# Patient Record
Sex: Female | Born: 1935 | Race: White | Hispanic: No | Marital: Married | Smoking: Never smoker
Health system: Southern US, Community
[De-identification: ages and names within clinical notes are randomized; demographics above are authoritative.]

## PROBLEM LIST (undated history)

## (undated) DIAGNOSIS — R569 Unspecified convulsions: Secondary | ICD-10-CM

## (undated) DIAGNOSIS — R413 Other amnesia: Secondary | ICD-10-CM

## (undated) DIAGNOSIS — R51 Headache: Secondary | ICD-10-CM

## (undated) DIAGNOSIS — I779 Disorder of arteries and arterioles, unspecified: Secondary | ICD-10-CM

## (undated) DIAGNOSIS — J90 Pleural effusion, not elsewhere classified: Secondary | ICD-10-CM

## (undated) DIAGNOSIS — F419 Anxiety disorder, unspecified: Secondary | ICD-10-CM

## (undated) DIAGNOSIS — J449 Chronic obstructive pulmonary disease, unspecified: Secondary | ICD-10-CM

## (undated) DIAGNOSIS — N390 Urinary tract infection, site not specified: Secondary | ICD-10-CM

## (undated) DIAGNOSIS — I10 Essential (primary) hypertension: Secondary | ICD-10-CM

## (undated) DIAGNOSIS — T148XXA Other injury of unspecified body region, initial encounter: Secondary | ICD-10-CM

## (undated) DIAGNOSIS — M1990 Unspecified osteoarthritis, unspecified site: Secondary | ICD-10-CM

## (undated) DIAGNOSIS — I739 Peripheral vascular disease, unspecified: Secondary | ICD-10-CM

## (undated) DIAGNOSIS — H905 Unspecified sensorineural hearing loss: Secondary | ICD-10-CM

## (undated) DIAGNOSIS — K219 Gastro-esophageal reflux disease without esophagitis: Secondary | ICD-10-CM

## (undated) DIAGNOSIS — I469 Cardiac arrest, cause unspecified: Secondary | ICD-10-CM

## (undated) DIAGNOSIS — I2510 Atherosclerotic heart disease of native coronary artery without angina pectoris: Secondary | ICD-10-CM

## (undated) DIAGNOSIS — I350 Nonrheumatic aortic (valve) stenosis: Secondary | ICD-10-CM

## (undated) HISTORY — DX: Urinary tract infection, site not specified: N39.0

## (undated) HISTORY — PX: JOINT REPLACEMENT: SHX530

## (undated) HISTORY — DX: Peripheral vascular disease, unspecified: I73.9

## (undated) HISTORY — DX: Pleural effusion, not elsewhere classified: J90

## (undated) HISTORY — DX: Nonrheumatic aortic (valve) stenosis: I35.0

## (undated) HISTORY — PX: APPENDECTOMY: SHX54

## (undated) HISTORY — PX: CARDIAC CATHETERIZATION: SHX172

## (undated) HISTORY — DX: Disorder of arteries and arterioles, unspecified: I77.9

## (undated) HISTORY — DX: Cardiac arrest, cause unspecified: I46.9

## (undated) HISTORY — PX: ABDOMINAL HYSTERECTOMY: SHX81

---

## 2000-09-19 ENCOUNTER — Ambulatory Visit (HOSPITAL_COMMUNITY): Admission: RE | Admit: 2000-09-19 | Discharge: 2000-09-19 | Payer: Self-pay | Admitting: Family Medicine

## 2000-09-19 ENCOUNTER — Encounter: Payer: Self-pay | Admitting: Family Medicine

## 2000-10-11 ENCOUNTER — Other Ambulatory Visit: Admission: RE | Admit: 2000-10-11 | Discharge: 2000-10-11 | Payer: Self-pay | Admitting: Obstetrics and Gynecology

## 2002-05-16 ENCOUNTER — Ambulatory Visit (HOSPITAL_COMMUNITY): Admission: RE | Admit: 2002-05-16 | Discharge: 2002-05-16 | Payer: Self-pay | Admitting: Family Medicine

## 2002-05-16 ENCOUNTER — Encounter: Payer: Self-pay | Admitting: Family Medicine

## 2002-06-17 ENCOUNTER — Ambulatory Visit (HOSPITAL_COMMUNITY): Admission: RE | Admit: 2002-06-17 | Discharge: 2002-06-17 | Payer: Self-pay | Admitting: Family Medicine

## 2002-06-17 ENCOUNTER — Encounter: Payer: Self-pay | Admitting: Family Medicine

## 2003-03-22 ENCOUNTER — Emergency Department (HOSPITAL_COMMUNITY): Admission: EM | Admit: 2003-03-22 | Discharge: 2003-03-23 | Payer: Self-pay | Admitting: Emergency Medicine

## 2004-04-27 ENCOUNTER — Ambulatory Visit (HOSPITAL_COMMUNITY): Admission: RE | Admit: 2004-04-27 | Discharge: 2004-04-27 | Payer: Self-pay | Admitting: Family Medicine

## 2004-07-12 ENCOUNTER — Ambulatory Visit (HOSPITAL_COMMUNITY): Admission: RE | Admit: 2004-07-12 | Discharge: 2004-07-12 | Payer: Self-pay | Admitting: Family Medicine

## 2004-09-13 ENCOUNTER — Inpatient Hospital Stay (HOSPITAL_COMMUNITY): Admission: RE | Admit: 2004-09-13 | Discharge: 2004-09-17 | Payer: Self-pay | Admitting: Orthopedic Surgery

## 2004-10-13 ENCOUNTER — Encounter (HOSPITAL_COMMUNITY): Admission: RE | Admit: 2004-10-13 | Discharge: 2004-11-12 | Payer: Self-pay | Admitting: Orthopedic Surgery

## 2006-05-01 ENCOUNTER — Ambulatory Visit (HOSPITAL_COMMUNITY): Admission: RE | Admit: 2006-05-01 | Discharge: 2006-05-01 | Payer: Self-pay | Admitting: Family Medicine

## 2008-09-18 ENCOUNTER — Ambulatory Visit (HOSPITAL_COMMUNITY): Admission: RE | Admit: 2008-09-18 | Discharge: 2008-09-18 | Payer: Self-pay | Admitting: Orthopedic Surgery

## 2008-09-22 ENCOUNTER — Inpatient Hospital Stay (HOSPITAL_COMMUNITY): Admission: RE | Admit: 2008-09-22 | Discharge: 2008-09-25 | Payer: Self-pay | Admitting: Orthopedic Surgery

## 2009-02-16 ENCOUNTER — Ambulatory Visit (HOSPITAL_COMMUNITY): Admission: RE | Admit: 2009-02-16 | Discharge: 2009-02-16 | Payer: Self-pay | Admitting: Family Medicine

## 2009-03-02 ENCOUNTER — Encounter: Admission: RE | Admit: 2009-03-02 | Discharge: 2009-03-02 | Payer: Self-pay | Admitting: Family Medicine

## 2010-01-24 ENCOUNTER — Encounter: Payer: Self-pay | Admitting: Family Medicine

## 2010-04-09 LAB — BASIC METABOLIC PANEL
BUN: 6 mg/dL (ref 6–23)
CO2: 29 mEq/L (ref 19–32)
CO2: 30 mEq/L (ref 19–32)
Calcium: 8.7 mg/dL (ref 8.4–10.5)
Calcium: 9.2 mg/dL (ref 8.4–10.5)
Creatinine, Ser: 0.65 mg/dL (ref 0.4–1.2)
Creatinine, Ser: 0.73 mg/dL (ref 0.4–1.2)
GFR calc Af Amer: >60 mL/min (ref >60)
GFR calc non Af Amer: >60 mL/min (ref >60)
Glucose, Bld: 125 mg/dL — ABNORMAL HIGH (ref 70–99)
Glucose, Bld: 134 mg/dL — ABNORMAL HIGH (ref 70–99)
Glucose, Bld: 176 mg/dL — ABNORMAL HIGH (ref 70–99)
Potassium: 4.1 mEq/L (ref 3.5–5.1)
Sodium: 131 mEq/L — ABNORMAL LOW (ref 135–145)
Sodium: 132 mEq/L — ABNORMAL LOW (ref 135–145)
Sodium: 134 mEq/L — ABNORMAL LOW (ref 135–145)

## 2010-04-09 LAB — PROTIME-INR
INR: 1 (ref 0.00–1.49)
INR: 1.5 (ref 0.00–1.49)
Prothrombin Time: 13.5 seconds (ref 11.6–15.2)
Prothrombin Time: 15.7 seconds — ABNORMAL HIGH (ref 11.6–15.2)
Prothrombin Time: 17.8 seconds — ABNORMAL HIGH (ref 11.6–15.2)
Prothrombin Time: 11.8 seconds (ref 11.6–15.2)

## 2010-04-09 LAB — COMPREHENSIVE METABOLIC PANEL
ALT: 12 U/L (ref 0–35)
AST: 20 U/L (ref 0–37)
Alkaline Phosphatase: 87 U/L (ref 39–117)
BUN: 11 mg/dL (ref 6–23)
Calcium: 9.9 mg/dL (ref 8.4–10.5)
Glucose, Bld: 91 mg/dL (ref 70–99)
Potassium: 3.4 mEq/L — ABNORMAL LOW (ref 3.5–5.1)
Sodium: 132 mEq/L — ABNORMAL LOW (ref 135–145)
Total Bilirubin: 0.6 mg/dL (ref 0.3–1.2)
Total Protein: 8 g/dL (ref 6.0–8.3)

## 2010-04-09 LAB — CBC
HCT: 26.9 % — ABNORMAL LOW (ref 36.0–46.0)
Hemoglobin: 8.6 g/dL — ABNORMAL LOW (ref 12.0–15.0)
Hemoglobin: 9.2 g/dL — ABNORMAL LOW (ref 12.0–15.0)
MCHC: 33.7 g/dL (ref 30.0–36.0)
MCHC: 33.9 g/dL (ref 30.0–36.0)
MCHC: 33.6 g/dL (ref 30.0–36.0)
MCV: 91 fL (ref 78.0–100.0)
MCV: 91.4 fL (ref 78.0–100.0)
MCV: 92.4 fL (ref 78.0–100.0)
Platelets: 269 10*3/uL (ref 150–400)
Platelets: 277 10*3/uL (ref 150–400)
RBC: 2.76 MIL/uL — ABNORMAL LOW (ref 3.87–5.11)
RBC: 2.85 MIL/uL — ABNORMAL LOW (ref 3.87–5.11)
RDW: 13.6 % (ref 11.5–15.5)
WBC: 6.9 10*3/uL (ref 4.0–10.5)
WBC: 8.6 10*3/uL (ref 4.0–10.5)
WBC: 9.9 10*3/uL (ref 4.0–10.5)
WBC: 8.5 10*3/uL (ref 4.0–10.5)

## 2010-04-09 LAB — TYPE AND SCREEN: ABO/RH(D): O POS

## 2010-04-09 LAB — URINALYSIS, ROUTINE W REFLEX MICROSCOPIC
Bilirubin Urine: NEGATIVE
Hgb urine dipstick: NEGATIVE
Ketones, ur: NEGATIVE mg/dL
Protein, ur: NEGATIVE mg/dL
pH: 6 (ref 5.0–8.0)

## 2010-05-21 NOTE — Discharge Summary (Signed)
Beverly [name], Beverly [name]                 ACCOUNT NO.:  [id number]   MEDICAL RECORD NO.:  [id number]          PATIENT TYPE:  INP   LOCATION:  1514                         FACILITY:  [facility]   PHYSICIAN:  Ollen Gross, M.D.    DATE OF BIRTH:  04-30-1932   DATE OF ADMISSION:  09/13/2004  DATE OF DISCHARGE:  09/17/2004                                 DISCHARGE SUMMARY   ADMISSION DIAGNOSES:  1.  Osteoarthritis, right knee.  2.  Anxiety.  3.  Mild chronic obstructive pulmonary disease.  4.  History of uterine cancer.  5.  Lumbar degenerative disk disease.  6.  Postmenopausal.   DISCHARGE DIAGNOSES:  1.  Osteoarthritis, right knee, status post right total knee replacement      arthroplasty, computer navigation assisted.  2.  Mild postoperative blood loss anemia.  3.  Hyponatremia postoperative.  4.  Postoperative hyponatremia, improved.  5.  Postoperative hypokalemia, improved.  6.  Anxiety.  7.  Mild chronic obstructive pulmonary disease.  8.  History of uterine cancer.  9.  Lumbar degenerative disk disease.  10. Postmenopausal.   PROCEDURE:  On September 13, 2004, right total knee arthroplasty, computer  navigation assisted.   SURGEON:  Ollen Gross, M.D.   ASSISTANT:  Alexzandrew L. Perkins, P.A.-C.   ANESTHESIA:  Spinal.   DRAINS:  Hemovac x1.   TOURNIQUET TIME:  Fifty-nine minutes at 300 mmHg.   CONSULTS:  None.   BRIEF HISTORY:  Ms. Antonacci is a 77 year old female who developed severe end-  stage arthritis of the right knee with complex valgus deformity.  Previous  tib/fib fracture with healed in valgus with a malunion at the fracture site.  She has subsequently gone on to develop significant arthritic changes and  now presents for total knee.   LABORATORY DATA:  CBC preop:  Hemoglobin 12.9, hematocrit 37.4.  Normal  white count.  Normal differential.  Postop hemoglobin 10.7, drifted down to  10.1.  Last noted H&H 9.6 and 27.4.  PT/PTT preop 12.3 and 32,  respectively.  Serial pro times followed.  The last noted PT/INR 23 and 2.  Chem panel on  admission all within normal limits.  Serial BMETs are followed.  Sodium  dropped from 136 down to 129, back up to 137.  Potassium dropped to 3.7 to  2.7, back up to 3.6.  Urinalysis cloudy, otherwise negative.  Blood group  type O+.   EKG dated September 09, 2004, normal sinus rhythm.  Normal EKG.   A two view chest dated September 09, 2004, COPD.  No acute chest disease.   HOSPITAL COURSE:  Admitted to [facility].  Tolerated the procedure  well.  Started on PCA and p.o. analgesics.  Had a difficult night with pain.  Doing a little bit better.  The following morning, she had a drop in her  potassium down to 2.7.  She was given potassium supplements, which she  responded to.  The potassium came back up the following day on postop day #2  to 3.3  Drain that was placed at the time of surgery came  out.  Reduced the  fluid rates.  Had a drop in her sodium also after surgery down to 131.  Started to get up with physical therapy by day #2.  Doing a little bit  better, although she did have increased pain.  Potassium had improved.  Sodium was only slowly improving.  She had a little bit of increase in the  opposite leg in the left knee, felt to be an arthritic flare.  Treated with  ice and pain meds.  Started getting up with more therapy.  By day 3, she  progressed well and was up ambulating approximately 200 feet.  The right  knee was doing better from a pain standpoint but still had pain in the left.  Due to the continued pain, the patient underwent an aspiration injection of  lidocaine and Depo-Medrol without difficulty at the bedside.  Electrolytes  continued to improve.   By September 17, 2004, the following day, the patient was doing much better.  Had been seen in rounds with Dr. Despina Hick.  Tolerated the previous injection  well of the left knee, which had relieved some of the symptoms.  From  the  right knee standpoint postoperatively, she is doing quite well, tolerating  her medicines, and was discharged home.   DISCHARGE PLAN:  1.  Patient was discharged home on September 15 , 2006.  2.  Discharge diagnoses:  Please see above.  3.  Discharge meds:  Coumadin, Percocet, Robaxin.  4.  Diet:  As tolerated.  5.  Follow up two weeks from surgery.  Call the office for an appointment at      [phone].  6.  Activity:  Weightbearing as tolerated.  Home health PT/OT and home      health nursing.  Total knee protocol.  May start showering.   DISPOSITION:  Home.   CONDITION ON DISCHARGE:  Improved.      Alexzandrew L. Julien Girt, P.A.      Ollen Gross, M.D.  Electronically Signed    ALP/MEDQ  D:  10/28/2004  T:  10/28/2004  Job:  [id number]   cc:   Angus G. Renard Matter, MD  Fax: [phone]

## 2010-05-21 NOTE — H&P (Signed)
NAME:  Beverly [name], Beverly [name]                 ACCOUNT NO.:  [id number]   MEDICAL RECORD NO.:  [id number]          PATIENT TYPE:  INP   LOCATION:  NA                           FACILITY:  [facility]   PHYSICIAN:  Ollen Gross, M.D.    DATE OF BIRTH:  Dec 23, 1932   DATE OF ADMISSION:  09/13/2004  DATE OF DISCHARGE:                                HISTORY & PHYSICAL   DATE OF OFFICE VISIT HISTORY AND PHYSICAL:  September 02, 2004.   DATE OF ADMISSION:  September 13, 2004.   CHIEF COMPLAINT:  Right knee pain.   HISTORY OF PRESENT ILLNESS:  The patient is a 77 year old female, well known  to Dr. Homero Fellers Aluisio.  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  Long-standing history of progressive right knee pain.  She is known to have  significant arthritis.  Problems date back about 20-25 years when she had a  right tibia fracture.  She underwent non-operative management __________.  She was seen in the office, where she was evaluated by Dr. Lequita Halt, and  shown to have valgus deformity about 15 to 20 degrees with severe end-stage  arthritis, worse lateral and patellofemoral, but involving all 3 components.  It is felt she would be a good candidate for knee replacement.  Risks and  benefits discussed.  The patient is subsequently admitted to the hospital.   ALLERGIES:  1.  PENICILLIN.  2.  CODEINE.   CURRENT MEDICATIONS:  1.  Toprol-XL 25 mg daily.  2.  Lozol 2.5 mg daily.  3.  Celebrex 200 mg daily.  4.  Vivelle-Dot estrogen 0.0375 patches.  5.  Xanax 0.5 p.r.n.  6.  Lortab 7.5 p.r.n.  7.  Lunesta 3 mg for sleep.  8.  Calcium plus vitamin D twice a day.  9.  Multivitamin daily.  10. Prevacid 15 mg daily.   PAST MEDICAL HISTORY:  1.  Anxiety.  2.  COPD.  3.  Uterine cancer.  4.  Postmenopausal.  5.  Degenerative disk disease.  6.  Right tibia fracture.   PAST SURGICAL HISTORY:  1.  Tonsillectomy.  2.  Tubal ligation.  3.  Hysterectomy.   SOCIAL HISTORY:  Married.  Semi-retired  Designer, jewellery.  Nonsmoker.  Has 2  children.  Husband and daughter will be assisting with care after surgery.   FAMILY HISTORY:  Mother living, age 75, with possible history of stroke.  Father with a history of lung cancer and arthritis.  Has a daughter with  arthritis.   REVIEW OF SYSTEMS:  GENERAL:  No fevers, chills, night sweats.  NEUROLOGIC:  No seizures, syncope, paralysis.  RESPIRATORY:  No shortness of breath,  productive cough, or hemoptysis.  CARDIOVASCULAR:  No chest pain, angina, or  orthopnea.  GI:  No nausea, vomiting, diarrhea, or constipation.  GU:  No  dysuria, hematuria, or discharge.  MUSCULOSKELETAL:  Right knee as found in  the history of present illness.   PHYSICAL EXAMINATION:  VITAL SIGNS:  Pulse 68, respirations 20, blood  pressure 152/78.  GENERAL:  A 77 year old white female, well-nourished, well-developed, in no  acute distress.  Alert, oriented, and cooperative.  Very pleasant.  Excellent historian.  HEENT:  Normocephalic and atraumatic.  Pupils round and reactive.  Oropharynx clear.  Extraocular movements intact.  NECK:  Supple.  No bruits.  CHEST:  Clear anterior and posterior chest walls.  No rhonchi, rales, or  wheezing.  HEART:  Regular rate and rhythm.  No murmur.  S1 and S2 noted.  ABDOMEN:  Soft, nontender.  Bowel sounds present.  RECTAL/BREAST/GENITALIA:  Not done, not pertinent to present illness.  EXTREMITIES:  Right knee shows a slight valgus malalignment with the right  lower extremity.  Marked crepitus noted on passive range of motion.  Range  of motion shows 5 to about 115 degrees.   IMPRESSION:  1.  Osteoarthritis, right knee.  2.  Anxiety.  3.  Mild chronic obstructive pulmonary disease.  4.  History of uterine cancer.  5.  Lumbar degenerative disk disease.  6.  Postmenopausal.   PLAN:  The patient admitted to [facility] to undergo right total  knee arthroplasty.  Surgery will be performed by Dr. Ollen Gross.      Alexzandrew L. Julien Girt, P.A.      Ollen Gross, M.D.  Electronically Signed    ALP/MEDQ  D:  09/12/2004  T:  09/13/2004  Job:  [id number]   cc:   Angus G. Renard Matter, MD  [address]  [place]  [place] [address]  Fax: [phone]

## 2010-05-21 NOTE — Op Note (Signed)
NAME:  Beverly [name], Beverly [name] NO.:  [id number]   MEDICAL RECORD NO.:  [id number]          PATIENT TYPE:  INP   LOCATION:  1514                         FACILITY:  [facility]   PHYSICIAN:  Ollen Gross, M.D.    DATE OF BIRTH:  09-27-1932   DATE OF PROCEDURE:  09/13/2004  DATE OF DISCHARGE:                                 OPERATIVE REPORT   PREOPERATIVE DIAGNOSES:  Osteoarthritis right knee.   POSTOPERATIVE DIAGNOSES:  Osteoarthritis right knee.   PROCEDURE:  Right total knee arthroplasty computer navigated.   SURGEON:  Ollen Gross, M.D.   ASSISTANT:  Avel Peace, P.A.-C.   ANESTHESIA:  Spinal.   ESTIMATED BLOOD LOSS:  Minimal.   DRAINS:  Hemovac x1.   TOURNIQUET TIME:  59 minutes at 300 mmHg.   COMPLICATIONS:  None.   CONDITION:  Stable to recovery.   BRIEF CLINICAL NOTE:  [name] is a 77 year old female who has developed  severe end-stage arthritis of the right knee. She has a complex valgus  deformity of the lower leg. She had a tibial fracture that healed in valgus  inclination with a recurvatum at the fracture site. She subsequently has  gone on to develop significant arthritic change and presents now for a total  knee arthroplasty.   DESCRIPTION OF PROCEDURE:  After successful administration of spinal  anesthetic, a tourniquet was placed high on the right thigh and right lower  extremity prepped and draped in the usual sterile fashion. Extremity was  wrapped in Esmarch, knee flexed, tourniquet inflated to 300 mmHg. A standard  midline incision was made with a 10 blade through the subcutaneous tissue to  the level of the extensor mechanism. A fresh blade was used to make a medial  parapatellar arthrotomy. We went medial instead of lateral as the majority  of her valgus deformity was in the lower leg as opposed to at the knee. The  arthrotomy was made and the soft tissue over the proximal medial tissue is  minimally elevated to the joint line with a  knife and soft tissue over the  proximal lateral tibia is elevated. The patella is everted, knee flexed 90  degrees, ACL and PCL removed. The 4 mm Schanz screws are then placed 2 into  the tibial and 2 into the femur for placement of the computerized arrays.  Once placed then we input the anatomic data into the computer for generation  of the tibial and femoral models. Her alignment is 16 degrees valgus with a  4 degree flexion contracture.   The distal femoral cutting block is placed under computer guidance to remove  10 mm off the distal femur with neutral varus valgus and neutral flexion  extension. The block is pinned and a cut is made. The verification device is  then placed on the cut bone surface to confirm that the cut was made as  planned. A size 3 was the most appropriate femoral component. The rotation  is marked at the epicondylar axis. Under computer guidance, we placed the  rotation guide and then placed the size 3 cutting block  matching that  rotation. The anterior, posterior and chamfer cuts are made.   The tibia is subluxed forward and the menisci are removed. The tibial  cutting guide is placed under computer guidance and we made a neutral varus  valgus cut, neutral flexion and extension and did this to remove  approximately 10 mm off the nondeficient lateral side. Tibial resection was  made with an oscillating saw. A size 2.5 was the most appropriate tibial  component. The femoral preparation is completed with the intercondylar cut  for the size 3. A size 2.5 mobile bearing tibial trial with a size 3  posterior stabilized femoral trial and a 10 mm posterior stabilized rotating  platform insert trial placed. With the 10, full extension is achieved with  excellent varus and valgus balance through full range of motion. Overall  alignment is 1.5 degrees valgus. We then finished preparing the proximal  tibia with the modular drill and keel punch with a size 2.5 tray.   Osteophytes are then removed off the posterior femur with the trial in  place. The patella was then everted and thickness measured to be 21 mm. Free  hand resection is taken to 12 mm. A 35 template is placed, lug holes are  drilled, trial patella is placed and it tracks normally. We then removed all  the trials and cut bone surfaces are prepared with pulsatile lavage. The  cement is mixed and once ready for implantation, the size 2.5 mobile bearing  tibial tray, size 3 posterior stabilized femur and 35 patella are cemented  into place and patella is held with a clamp. A trial 10 mm insert is placed,  knee held in full extension, all extruded cement removed. Once the cement  was fully hardened then the permanent 10 mm posterior stabilized rotating  platform insert is placed into the tibial tray. The wound is copiously  irrigated with saline solution and the extensor mechanism closed over a  hemovac drain with interrupted #1 PDS. The tourniquet was released with a  total time of 55 minutes. Flexion against gravity is 130 degrees. The subcu  is closed with interrupted 2-0 Vicryl and subcuticular with running 4-0  Monocryl. The drain is hooked to suction, incision cleaned and dried and  Steri-Strips and a bulky sterile dressing applied. She is then awakened and  transported to recovery in stable condition.      Ollen Gross, M.D.  Electronically Signed     FA/MEDQ  D:  09/13/2004  T:  09/13/2004  Job:  [id number]

## 2010-06-01 ENCOUNTER — Other Ambulatory Visit: Payer: Self-pay | Admitting: Family Medicine

## 2010-06-01 DIAGNOSIS — M5430 Sciatica, unspecified side: Secondary | ICD-10-CM

## 2010-06-01 DIAGNOSIS — M549 Dorsalgia, unspecified: Secondary | ICD-10-CM

## 2010-06-03 ENCOUNTER — Ambulatory Visit (INDEPENDENT_AMBULATORY_CARE_PROVIDER_SITE_OTHER): Payer: Medicare Other | Admitting: Otolaryngology

## 2010-06-03 DIAGNOSIS — R439 Unspecified disturbances of smell and taste: Secondary | ICD-10-CM

## 2010-06-03 DIAGNOSIS — J310 Chronic rhinitis: Secondary | ICD-10-CM

## 2010-06-03 DIAGNOSIS — J343 Hypertrophy of nasal turbinates: Secondary | ICD-10-CM

## 2010-06-14 ENCOUNTER — Ambulatory Visit
Admission: RE | Admit: 2010-06-14 | Discharge: 2010-06-14 | Disposition: A | Discharge status: Home / Self Care | Payer: Medicare Other | Source: Ambulatory Visit | Attending: Family Medicine | Admitting: Family Medicine

## 2010-06-14 DIAGNOSIS — M5430 Sciatica, unspecified side: Secondary | ICD-10-CM

## 2010-06-14 DIAGNOSIS — M549 Dorsalgia, unspecified: Secondary | ICD-10-CM

## 2010-09-13 ENCOUNTER — Other Ambulatory Visit: Payer: Self-pay | Admitting: Family Medicine

## 2010-09-13 DIAGNOSIS — M542 Cervicalgia: Secondary | ICD-10-CM

## 2010-09-14 ENCOUNTER — Ambulatory Visit
Admission: RE | Admit: 2010-09-14 | Discharge: 2010-09-14 | Disposition: A | Discharge status: Home / Self Care | Payer: Medicare Other | Source: Ambulatory Visit | Attending: Family Medicine | Admitting: Family Medicine

## 2010-09-14 DIAGNOSIS — M542 Cervicalgia: Secondary | ICD-10-CM

## 2010-09-14 MED ORDER — TRIAMCINOLONE ACETONIDE 40 MG/ML IJ SUSP (RADIOLOGY)
60.0000 mg | Freq: Once | INTRAMUSCULAR | Status: AC
  Administered 2010-09-14: 60 mg via EPIDURAL

## 2010-09-14 MED ORDER — IOHEXOL 300 MG/ML  SOLN
1.0000 mL | Freq: Once | INTRAMUSCULAR | Status: AC | PRN
  Administered 2010-09-14: 1 mL via EPIDURAL

## 2011-02-09 ENCOUNTER — Other Ambulatory Visit: Payer: Self-pay | Admitting: Family Medicine

## 2011-02-09 DIAGNOSIS — M545 Low back pain: Secondary | ICD-10-CM

## 2011-02-18 ENCOUNTER — Ambulatory Visit
Admission: RE | Admit: 2011-02-18 | Discharge: 2011-02-18 | Disposition: A | Discharge status: Home / Self Care | Payer: Medicare Other | Source: Ambulatory Visit | Attending: Family Medicine | Admitting: Family Medicine

## 2011-02-18 DIAGNOSIS — M545 Low back pain: Secondary | ICD-10-CM

## 2011-02-18 NOTE — Discharge Instructions (Signed)

## 2011-05-24 ENCOUNTER — Other Ambulatory Visit: Payer: Self-pay | Admitting: Family Medicine

## 2011-05-24 DIAGNOSIS — M545 Low back pain: Secondary | ICD-10-CM

## 2011-06-14 ENCOUNTER — Ambulatory Visit
Admission: RE | Admit: 2011-06-14 | Discharge: 2011-06-14 | Disposition: A | Discharge status: Home / Self Care | Payer: Medicare Other | Source: Ambulatory Visit | Attending: Family Medicine | Admitting: Family Medicine

## 2011-06-14 DIAGNOSIS — M545 Low back pain: Secondary | ICD-10-CM

## 2011-06-14 MED ORDER — METHYLPREDNISOLONE ACETATE 40 MG/ML INJ SUSP (RADIOLOG
120.0000 mg | Freq: Once | INTRAMUSCULAR | Status: AC
  Administered 2011-06-14: 120 mg via EPIDURAL

## 2011-06-14 MED ORDER — IOHEXOL 180 MG/ML  SOLN
1.0000 mL | Freq: Once | INTRAMUSCULAR | Status: AC | PRN
  Administered 2011-06-14: 1 mL via EPIDURAL

## 2011-10-03 ENCOUNTER — Other Ambulatory Visit (HOSPITAL_COMMUNITY): Payer: Self-pay | Admitting: Family Medicine

## 2011-10-03 DIAGNOSIS — Z139 Encounter for screening, unspecified: Secondary | ICD-10-CM

## 2011-10-04 ENCOUNTER — Other Ambulatory Visit (HOSPITAL_COMMUNITY): Payer: Self-pay | Admitting: Family Medicine

## 2011-10-04 ENCOUNTER — Ambulatory Visit (HOSPITAL_COMMUNITY)
Admission: RE | Admit: 2011-10-04 | Discharge: 2011-10-04 | Disposition: A | Discharge status: Home / Self Care | Payer: Medicare Other | Source: Ambulatory Visit | Attending: Family Medicine | Admitting: Family Medicine

## 2011-10-04 DIAGNOSIS — R059 Cough, unspecified: Secondary | ICD-10-CM | POA: Insufficient documentation

## 2011-10-04 DIAGNOSIS — R05 Cough: Secondary | ICD-10-CM

## 2011-10-04 DIAGNOSIS — Z1231 Encounter for screening mammogram for malignant neoplasm of breast: Secondary | ICD-10-CM | POA: Insufficient documentation

## 2011-10-04 DIAGNOSIS — R0989 Other specified symptoms and signs involving the circulatory and respiratory systems: Secondary | ICD-10-CM

## 2011-10-04 DIAGNOSIS — R509 Fever, unspecified: Secondary | ICD-10-CM | POA: Insufficient documentation

## 2011-10-04 DIAGNOSIS — Z139 Encounter for screening, unspecified: Secondary | ICD-10-CM

## 2012-04-16 ENCOUNTER — Other Ambulatory Visit (HOSPITAL_COMMUNITY): Payer: Self-pay | Admitting: Family Medicine

## 2012-04-16 ENCOUNTER — Ambulatory Visit (HOSPITAL_COMMUNITY)
Admission: RE | Admit: 2012-04-16 | Discharge: 2012-04-16 | Disposition: A | Discharge status: Home / Self Care | Payer: Medicare Other | Source: Ambulatory Visit | Attending: Family Medicine | Admitting: Family Medicine

## 2012-04-16 DIAGNOSIS — I6529 Occlusion and stenosis of unspecified carotid artery: Secondary | ICD-10-CM | POA: Insufficient documentation

## 2012-04-16 DIAGNOSIS — I658 Occlusion and stenosis of other precerebral arteries: Secondary | ICD-10-CM | POA: Insufficient documentation

## 2012-04-16 DIAGNOSIS — H9190 Unspecified hearing loss, unspecified ear: Secondary | ICD-10-CM | POA: Insufficient documentation

## 2012-04-16 DIAGNOSIS — R51 Headache: Secondary | ICD-10-CM | POA: Insufficient documentation

## 2012-04-16 DIAGNOSIS — H9319 Tinnitus, unspecified ear: Secondary | ICD-10-CM | POA: Insufficient documentation

## 2012-04-16 DIAGNOSIS — H9192 Unspecified hearing loss, left ear: Secondary | ICD-10-CM

## 2012-04-19 ENCOUNTER — Ambulatory Visit (HOSPITAL_COMMUNITY)
Admission: RE | Admit: 2012-04-19 | Discharge: 2012-04-19 | Disposition: A | Discharge status: Home / Self Care | Payer: Medicare Other | Source: Ambulatory Visit | Attending: Family Medicine | Admitting: Family Medicine

## 2012-04-19 DIAGNOSIS — I059 Rheumatic mitral valve disease, unspecified: Secondary | ICD-10-CM

## 2012-04-19 DIAGNOSIS — R011 Cardiac murmur, unspecified: Secondary | ICD-10-CM | POA: Insufficient documentation

## 2012-04-19 DIAGNOSIS — I359 Nonrheumatic aortic valve disorder, unspecified: Secondary | ICD-10-CM | POA: Insufficient documentation

## 2012-04-19 NOTE — Progress Notes (Signed)
*  PRELIMINARY RESULTS* Echocardiogram 2D Echocardiogram has been performed.  Beverly [name] 04/19/2012, 10:10 AM

## 2012-05-01 ENCOUNTER — Other Ambulatory Visit: Payer: Self-pay | Admitting: Family Medicine

## 2012-05-01 DIAGNOSIS — M549 Dorsalgia, unspecified: Secondary | ICD-10-CM

## 2012-05-07 ENCOUNTER — Ambulatory Visit
Admission: RE | Admit: 2012-05-07 | Discharge: 2012-05-07 | Disposition: A | Discharge status: Home / Self Care | Payer: Medicare Other | Source: Ambulatory Visit | Attending: Family Medicine | Admitting: Family Medicine

## 2012-05-07 DIAGNOSIS — M549 Dorsalgia, unspecified: Secondary | ICD-10-CM

## 2012-05-07 MED ORDER — METHYLPREDNISOLONE ACETATE 40 MG/ML INJ SUSP (RADIOLOG
120.0000 mg | Freq: Once | INTRAMUSCULAR | Status: AC
  Administered 2012-05-07: 120 mg via EPIDURAL

## 2012-05-07 MED ORDER — IOHEXOL 180 MG/ML  SOLN
1.0000 mL | Freq: Once | INTRAMUSCULAR | Status: AC | PRN
  Administered 2012-05-07: 1 mL via EPIDURAL

## 2012-10-03 DIAGNOSIS — R569 Unspecified convulsions: Secondary | ICD-10-CM

## 2012-10-03 HISTORY — DX: Unspecified convulsions: R56.9

## 2012-10-17 ENCOUNTER — Other Ambulatory Visit (HOSPITAL_COMMUNITY): Payer: Self-pay | Admitting: Family Medicine

## 2012-10-17 DIAGNOSIS — W19XXXA Unspecified fall, initial encounter: Secondary | ICD-10-CM

## 2012-10-17 DIAGNOSIS — R4781 Slurred speech: Secondary | ICD-10-CM

## 2012-10-18 ENCOUNTER — Encounter (HOSPITAL_COMMUNITY): Payer: Self-pay | Admitting: Emergency Medicine

## 2012-10-18 ENCOUNTER — Observation Stay (HOSPITAL_COMMUNITY)
Admission: EM | Admit: 2012-10-18 | Discharge: 2012-10-21 | Disposition: A | Discharge status: Home / Self Care | Payer: Medicare Other | Attending: Family Medicine | Admitting: Family Medicine

## 2012-10-18 ENCOUNTER — Emergency Department (HOSPITAL_COMMUNITY): Payer: Medicare Other

## 2012-10-18 DIAGNOSIS — E871 Hypo-osmolality and hyponatremia: Secondary | ICD-10-CM | POA: Diagnosis present

## 2012-10-18 DIAGNOSIS — S065X9A Traumatic subdural hemorrhage with loss of consciousness of unspecified duration, initial encounter: Secondary | ICD-10-CM | POA: Diagnosis present

## 2012-10-18 DIAGNOSIS — G459 Transient cerebral ischemic attack, unspecified: Secondary | ICD-10-CM | POA: Diagnosis present

## 2012-10-18 DIAGNOSIS — S065XAA Traumatic subdural hemorrhage with loss of consciousness status unknown, initial encounter: Secondary | ICD-10-CM

## 2012-10-18 DIAGNOSIS — I10 Essential (primary) hypertension: Secondary | ICD-10-CM | POA: Diagnosis present

## 2012-10-18 DIAGNOSIS — I6200 Nontraumatic subdural hemorrhage, unspecified: Secondary | ICD-10-CM

## 2012-10-18 DIAGNOSIS — X58XXXA Exposure to other specified factors, initial encounter: Secondary | ICD-10-CM | POA: Insufficient documentation

## 2012-10-18 HISTORY — DX: Unspecified osteoarthritis, unspecified site: M19.90

## 2012-10-18 HISTORY — DX: Unspecified sensorineural hearing loss: H90.5

## 2012-10-18 HISTORY — DX: Traumatic subdural hemorrhage with loss of consciousness status unknown, initial encounter: S06.5XAA

## 2012-10-18 LAB — POCT I-STAT, CHEM 8
Chloride: 90 mEq/L — ABNORMAL LOW (ref 96–112)
Creatinine, Ser: 0.7 mg/dL (ref 0.50–1.10)
Glucose, Bld: 120 mg/dL — ABNORMAL HIGH (ref 70–99)
HCT: 41 % (ref 36.0–46.0)
Hemoglobin: 13.9 g/dL (ref 12.0–15.0)
Potassium: 3.6 mEq/L (ref 3.5–5.1)
Sodium: 128 mEq/L — ABNORMAL LOW (ref 135–145)

## 2012-10-18 LAB — COMPREHENSIVE METABOLIC PANEL
AST: 18 U/L (ref 0–37)
BUN: 9 mg/dL (ref 6–23)
CO2: 30 mEq/L (ref 19–32)
Calcium: 10 mg/dL (ref 8.4–10.5)
Chloride: 88 mEq/L — ABNORMAL LOW (ref 96–112)
Creatinine, Ser: 0.63 mg/dL (ref 0.50–1.10)
GFR calc Af Amer: >90 mL/min (ref >90)
GFR calc non Af Amer: 84 mL/min — ABNORMAL LOW (ref >90)
Glucose, Bld: 125 mg/dL — ABNORMAL HIGH (ref 70–99)
Total Bilirubin: 0.3 mg/dL (ref 0.3–1.2)
Total Protein: 7.7 g/dL (ref 6.0–8.3)

## 2012-10-18 LAB — CBC
HCT: 35.7 % — ABNORMAL LOW (ref 36.0–46.0)
Hemoglobin: 12.4 g/dL (ref 12.0–15.0)
MCV: 86.2 fL (ref 78.0–100.0)
Platelets: 360 10*3/uL (ref 150–400)
RBC: 4.14 MIL/uL (ref 3.87–5.11)
RDW: 13.7 % (ref 11.5–15.5)
WBC: 8.1 10*3/uL (ref 4.0–10.5)

## 2012-10-18 LAB — DIFFERENTIAL
Basophils Absolute: 0 10*3/uL (ref 0.0–0.1)
Eosinophils Relative: 2 % (ref 0–5)
Lymphocytes Relative: 32 % (ref 12–46)
Lymphs Abs: 2.6 10*3/uL (ref 0.7–4.0)
Monocytes Absolute: 0.8 10*3/uL (ref 0.1–1.0)
Monocytes Relative: 10 % (ref 3–12)

## 2012-10-18 LAB — RAPID URINE DRUG SCREEN, HOSP PERFORMED
Cocaine: NOT DETECTED
Opiates: POSITIVE — AB
Tetrahydrocannabinol: NOT DETECTED

## 2012-10-18 LAB — URINALYSIS, ROUTINE W REFLEX MICROSCOPIC
Bilirubin Urine: NEGATIVE
Hgb urine dipstick: NEGATIVE
Protein, ur: NEGATIVE mg/dL
Specific Gravity, Urine: 1.01 (ref 1.005–1.030)
Urobilinogen, UA: 0.2 mg/dL (ref 0.0–1.0)
pH: 5.5 (ref 5.0–8.0)

## 2012-10-18 LAB — PROTIME-INR: INR: 0.93 (ref 0.00–1.49)

## 2012-10-18 LAB — TROPONIN I: Troponin I: <0.3 ng/mL (ref <0.30)

## 2012-10-18 LAB — ETHANOL: Alcohol, Ethyl (B): <11 mg/dL (ref 0–11)

## 2012-10-18 MED ORDER — ALPRAZOLAM 0.5 MG PO TABS
ORAL_TABLET | ORAL | Status: AC
  Filled 2012-10-18: qty 1

## 2012-10-18 MED ORDER — ALPRAZOLAM 0.5 MG PO TABS
0.5000 mg | ORAL_TABLET | Freq: Once | ORAL | Status: AC
  Administered 2012-10-18: 0.5 mg via ORAL

## 2012-10-18 MED ORDER — SODIUM CHLORIDE 0.9 % IV SOLN
1000.0000 mg | Freq: Once | INTRAVENOUS | Status: AC
  Administered 2012-10-18: 1000 mg via INTRAVENOUS
  Filled 2012-10-18: qty 10

## 2012-10-18 MED ORDER — LEVETIRACETAM 500 MG/5ML IV SOLN
INTRAVENOUS | Status: AC
  Filled 2012-10-18: qty 10

## 2012-10-18 NOTE — ED Notes (Signed)
Pt states she fell in kitchen on oct 1st, and hit her head, no LOC

## 2012-10-18 NOTE — ED Notes (Signed)
Pt states she has been unable to hear from left ear x 35mo. And has been seen for this. States she fell Oct 1 and cut left brow. Steri stirps remain intact.States that yesterday and today she has had brief episodes of not being able to make out her words, lasting only for a few minutes. States she had one episode yesterday and one this evening at ~1630. No neuro deficits at this time.

## 2012-10-18 NOTE — H&P (Signed)
PCP:   Alice Reichert, MD   Chief Complaint:  Difficulty speaking  HPI: 79 year old female with no significant medical problems who came to the ED for 2 episodes of difficulty speaking. 2 weeks ago patient fell at home when she tripped while coming out of kitchen she landed on her head and saw her PCP that date but only had bruises and laceration. 5 days ago patient started having difficulty speaking there she had difficulty finding the right words to explain herself, she had another episode with the same symptoms today which lasted for 10 minutes. In the ED CT scan of the head was done which showed small left subdural hematoma, [facility]surgery at [facility] was consulted by the ED physician. Dr. Venetia Maxon recommended no surgery at this time, and followup in the office after discharge. Neurology recommended patient should be admitted for further workup for possible TIA. Patient denies any weakness of extremities, no passing out episode, no seizure. Patient has been given one dose of Keppra in the ED as a preventive measure for possible seizure. She also denies chest pain no shortness of breath no nausea vomiting or diarrhea  Allergies:   Allergies  Allergen Reactions  . Penicillins Itching      Past Medical History  Diagnosis Date  . Arthritis   . Hearing loss, central     Left ear    Past Surgical History  Procedure Laterality Date  . Joint replacement Bilateral     knees  . Abdominal hysterectomy    . Appendectomy      Prior to Admission medications   Medication Sig Start Date End Date Taking? Authorizing Provider  ALPRAZolam Prudy Feeler) 0.5 MG tablet Take 0.5 mg by mouth 3 (three) times daily as needed for anxiety.   Yes Historical Provider, MD  fluticasone (FLONASE) 50 MCG/ACT nasal spray Place 1 spray into the nose daily. 09/18/12  Yes Historical Provider, MD  HYDROcodone-acetaminophen (NORCO) 7.5-325 MG per tablet Take 1 tablet by mouth every 4 (four) hours as needed for pain.   Yes  Historical Provider, MD  indapamide (LOZOL) 2.5 MG tablet Take 2.5 mg by mouth daily.   Yes Historical Provider, MD  methocarbamol (ROBAXIN) 750 MG tablet Take 750 mg by mouth 4 (four) times daily.   Yes Historical Provider, MD  metoprolol succinate (TOPROL-XL) 50 MG 24 hr tablet Take 50 mg by mouth daily. Take with or immediately following a meal.   Yes Historical Provider, MD  pantoprazole (PROTONIX) 40 MG tablet Take 40 mg by mouth daily.   Yes Historical Provider, MD    Social History:  reports that she has never smoked. She has never used smokeless tobacco. She reports that she does not drink alcohol or use illicit drugs.  History reviewed. No pertinent family history.   All the positives are listed in BOLD  Review of Systems:  HEENT: Headache, blurred vision, runny nose, sore throat, left-sided hearing loss Chest : Shortness of breath, history of COPD, Asthma Heart : Chest pain, history of coronary arterey disease GI:  Nausea, vomiting, diarrhea, constipation, GERD GU: Dysuria, urgency, frequency of urination, hematuria Neuro: Stroke, seizures, syncope Psych: Depression, anxiety, hallucinations   Physical Exam: Blood pressure 157/72, pulse 95, temperature 97.9 F (36.6 C), temperature source Oral, resp. rate 15, SpO2 98.00%. Constitutional:   Patient is a well-developed and well-nourished female in no acute distress and cooperative with exam. Head: Normocephalic and atraumatic Mouth: Mucus membranes moist Eyes: PERRL, EOMI, conjunctivae normal Neck: Supple, No Thyromegaly Cardiovascular: RRR, S1  normal, S2 normal Pulmonary/Chest: CTAB, no wheezes, rales, or rhonchi Abdominal: Soft. Non-tender, non-distended, bowel sounds are normal, no masses, organomegaly, or guarding present.  Neurological: A&O x3, Strenght is normal and symmetric bilaterally, cranial nerve II-XII are grossly intact, no focal motor deficit, sensory intact to light touch bilaterally.  Extremities : No  Cyanosis, Clubbing or Edema   Labs on Admission:  Results for orders placed during the hospital encounter of 10/18/12 (from the past 48 hour(s))  GLUCOSE, CAPILLARY     Status: Abnormal   Collection Time    10/18/12  7:05 PM      Result Value Range   Glucose-Capillary 124 (*) 70 - 99 mg/dL  ETHANOL     Status: None   Collection Time    10/18/12  7:10 PM      Result Value Range   Alcohol, Ethyl (B) <11  0 - 11 mg/dL   Comment:            LOWEST DETECTABLE LIMIT FOR     SERUM ALCOHOL IS 11 mg/dL     FOR MEDICAL PURPOSES ONLY  PROTIME-INR     Status: None   Collection Time    10/18/12  7:10 PM      Result Value Range   Prothrombin Time 12.3  11.6 - 15.2 seconds   INR 0.93  0.00 - 1.49  APTT     Status: None   Collection Time    10/18/12  7:10 PM      Result Value Range   aPTT 30  24 - 37 seconds  CBC     Status: Abnormal   Collection Time    10/18/12  7:10 PM      Result Value Range   WBC 8.1  4.0 - 10.5 K/uL   RBC 4.14  3.87 - 5.11 MIL/uL   Hemoglobin 12.4  12.0 - 15.0 g/dL   HCT 29.5 (*) 62.1 - 30.8 %   MCV 86.2  78.0 - 100.0 fL   MCH 30.0  26.0 - 34.0 pg   MCHC 34.7  30.0 - 36.0 g/dL   RDW 65.7  84.6 - 96.2 %   Platelets 360  150 - 400 K/uL  DIFFERENTIAL     Status: None   Collection Time    10/18/12  7:10 PM      Result Value Range   Neutrophils Relative % 56  43 - 77 %   Neutro Abs 4.5  1.7 - 7.7 K/uL   Lymphocytes Relative 32  12 - 46 %   Lymphs Abs 2.6  0.7 - 4.0 K/uL   Monocytes Relative 10  3 - 12 %   Monocytes Absolute 0.8  0.1 - 1.0 K/uL   Eosinophils Relative 2  0 - 5 %   Eosinophils Absolute 0.1  0.0 - 0.7 K/uL   Basophils Relative 0  0 - 1 %   Basophils Absolute 0.0  0.0 - 0.1 K/uL  COMPREHENSIVE METABOLIC PANEL     Status: Abnormal   Collection Time    10/18/12  7:10 PM      Result Value Range   Sodium 128 (*) 135 - 145 mEq/L   Potassium 3.6  3.5 - 5.1 mEq/L   Chloride 88 (*) 96 - 112 mEq/L   CO2 30  19 - 32 mEq/L   Glucose, Bld 125 (*) 70  - 99 mg/dL   BUN 9  6 - 23 mg/dL   Creatinine, Ser 9.52  0.50 -  1.10 mg/dL   Calcium 16.1  8.4 - 09.6 mg/dL   Total Protein 7.7  6.0 - 8.3 g/dL   Albumin 4.2  3.5 - 5.2 g/dL   AST 18  0 - 37 U/L   ALT 8  0 - 35 U/L   Alkaline Phosphatase 96  39 - 117 U/L   Total Bilirubin 0.3  0.3 - 1.2 mg/dL   GFR calc non Af Amer 84 (*) >90 mL/min   GFR calc Af Amer >90  >90 mL/min   Comment: (NOTE)     The eGFR has been calculated using the CKD EPI equation.     This calculation has not been validated in all clinical situations.     eGFR's persistently <90 mL/min signify possible Chronic Kidney     Disease.  TROPONIN I     Status: None   Collection Time    10/18/12  7:10 PM      Result Value Range   Troponin I <0.30  <0.30 ng/mL   Comment:            Due to the release kinetics of cTnI,     a negative result within the first hours     of the onset of symptoms does not rule out     myocardial infarction with certainty.     If myocardial infarction is still suspected,     repeat the test at appropriate intervals.  POCT I-STAT, CHEM 8     Status: Abnormal   Collection Time    10/18/12  7:25 PM      Result Value Range   Sodium 128 (*) 135 - 145 mEq/L   Potassium 3.6  3.5 - 5.1 mEq/L   Chloride 90 (*) 96 - 112 mEq/L   BUN 9  6 - 23 mg/dL   Creatinine, Ser 0.45  0.50 - 1.10 mg/dL   Glucose, Bld 409 (*) 70 - 99 mg/dL   Calcium, Ion 8.11  9.14 - 1.30 mmol/L   TCO2 27  0 - 100 mmol/L   Hemoglobin 13.9  12.0 - 15.0 g/dL   HCT 78.2  95.6 - 21.3 %  URINE RAPID DRUG SCREEN (HOSP PERFORMED)     Status: Abnormal   Collection Time    10/18/12  7:44 PM      Result Value Range   Opiates POSITIVE (*) NONE DETECTED   Cocaine NONE DETECTED  NONE DETECTED   Benzodiazepines NONE DETECTED  NONE DETECTED   Amphetamines NONE DETECTED  NONE DETECTED   Tetrahydrocannabinol NONE DETECTED  NONE DETECTED   Barbiturates NONE DETECTED  NONE DETECTED   Comment:            DRUG SCREEN FOR MEDICAL PURPOSES      ONLY.  IF CONFIRMATION IS NEEDED     FOR ANY PURPOSE, NOTIFY LAB     WITHIN 5 DAYS.                LOWEST DETECTABLE LIMITS     FOR URINE DRUG SCREEN     Drug Class       Cutoff (ng/mL)     Amphetamine      1000     Barbiturate      200     Benzodiazepine   200     Tricyclics       300     Opiates          300     Cocaine  300     THC              50  URINALYSIS, ROUTINE W REFLEX MICROSCOPIC     Status: None   Collection Time    10/18/12  7:44 PM      Result Value Range   Color, Urine YELLOW  YELLOW   APPearance CLEAR  CLEAR   Specific Gravity, Urine 1.010  1.005 - 1.030   pH 5.5  5.0 - 8.0   Glucose, UA NEGATIVE  NEGATIVE mg/dL   Hgb urine dipstick NEGATIVE  NEGATIVE   Bilirubin Urine NEGATIVE  NEGATIVE   Ketones, ur NEGATIVE  NEGATIVE mg/dL   Protein, ur NEGATIVE  NEGATIVE mg/dL   Urobilinogen, UA 0.2  0.0 - 1.0 mg/dL   Nitrite NEGATIVE  NEGATIVE   Leukocytes, UA NEGATIVE  NEGATIVE   Comment: MICROSCOPIC NOT DONE ON URINES WITH NEGATIVE PROTEIN, BLOOD, LEUKOCYTES, NITRITE, OR GLUCOSE <1000 mg/dL.    Radiological Exams on Admission: Ct Head Wo Contrast  10/18/2012   CLINICAL DATA:  2 episodes of a ectasia.  EXAM: CT HEAD WITHOUT CONTRAST  TECHNIQUE: Contiguous axial images were obtained from the base of the skull through the vertex without intravenous contrast.  COMPARISON:  None.  FINDINGS: Small left subdural hematoma measuring 8 mm in greatest thickness extends over the left antral lateral frontal lobe and minimally over the left temporal lobe. This is nearly isodense to white matter consistent with a subacute subdural hematoma. It flattens the underlying gyri and partly effaces underlying sulci. It causes a mild midline shift to the right of 3 mm.  No other intracranial hemorrhage.  The ventricles are normal in size, for this patient's age, and normal in configuration. There are no parenchymal masses. There is no evidence of a cortical infarct. There are no other  extra-axial abnormalities.  No skull fracture or lesion.  Mild ethmoid sinus mucosal thickening.  IMPRESSION: Subacute left subdural hematoma extends over the left antral lateral frontal lobe and minimally over the left anterior temporal lobe. It measures 8 mm in thickness and causes mild mass effect and 3 mm of midline shift to the right.  No other intracranial hemorrhage. No evidence of a cortical infarct.   Electronically Signed   By: Amie Portland M.D.   On: 10/18/2012 19:36    Assessment/Plan Active Problems:   Subdural hematoma   TIA (transient ischemic attack)   HTN (hypertension)   Hyponatremia  Patient will be admitted under telemetry, will do the workup for TIA, will check MRI and MRA of the brain, 2-D echo, carotid Dopplers. Patient will not be given Aspirin due to recent subdural hematoma, neurology will be consulted in the a.m. Patient is to followup with neurosurgery as outpatient for the treatment of subdural hematoma Patient also has hyponatremia, will obtain serum osmolality and start IV normal seen at 75 mL per hour Check BMP in the morning  Code status: Full code  Family discussion: Discussed with patient's daughter at bedside   Time Spent on Admission: 75 min  [facility] Pager: [phone] 10/18/2012, 11:02 PM  If 7PM-7AM, please contact night-coverage  [email or web address]  Password [email or web address]

## 2012-10-18 NOTE — ED Provider Notes (Signed)
CSN: [id number]     Arrival date & time 10/18/12  1756 History  This chart was scribed for Shelda Jakes, MD by Danella Maiers, ED Scribe. This patient was seen in room [facility]08/[facility]08 and the patient's care was started at 6:27 PM.    Chief Complaint  Patient presents with  . dysarthria    The history is provided by the patient. No language interpreter was used.   HPI Comments: Beverly [name] is a 79 y.o. female who presents to the Emergency Department complaining of two episodes of aphasia. Five days ago she had difficulty forming words - she knew what she wanted to say but couldn't say it, that lasted for ten minutes. She had another episode with the same symptoms two hours ago that lasted for ten minutes. She fell and hit her head on Oct.1 and saw her PCP Dr Renard Matter that day but had only bruises and laceration. She saw her PCP again three days ago and he scheduled a CAT scan for tomorrow. She denies any other symptoms.  PCP - Dr Butch Penny  Past Medical History  Diagnosis Date  . Arthritis   . Hearing loss, central     Left ear   Past Surgical History  Procedure Laterality Date  . Joint replacement Bilateral     knees  . Abdominal hysterectomy    . Appendectomy     History reviewed. No pertinent family history. History  Substance Use Topics  . Smoking status: Never Smoker   . Smokeless tobacco: Never Used  . Alcohol Use: No   OB History   Grav Para Term Preterm Abortions TAB SAB Ect Mult Living                 Review of Systems  Constitutional: Negative for fever and chills.  HENT: Positive for hearing loss. Negative for rhinorrhea and sore throat.   Eyes: Negative for visual disturbance.  Respiratory: Negative for cough and shortness of breath.   Cardiovascular: Negative for chest pain and leg swelling.  Gastrointestinal: Negative for nausea, vomiting, abdominal pain and diarrhea.  Genitourinary: Negative for dysuria.  Musculoskeletal: Positive for back pain.   Skin: Negative for rash.  Neurological: Positive for speech difficulty. Negative for dizziness, light-headedness and headaches.  Hematological: Does not bruise/bleed easily.  Psychiatric/Behavioral: Negative for confusion.  All other systems reviewed and are negative.    Allergies  Penicillins  Home Medications   Current Outpatient Rx  Name  Route  Sig  Dispense  Refill  . ALPRAZolam (XANAX) 0.5 MG tablet   Oral   Take 0.5 mg by mouth 3 (three) times daily as needed for anxiety.         . fluticasone (FLONASE) 50 MCG/ACT nasal spray   Nasal   Place 1 spray into the nose daily.         Marland Kitchen HYDROcodone-acetaminophen (NORCO) 7.5-325 MG per tablet   Oral   Take 1 tablet by mouth every 4 (four) hours as needed for pain.         . indapamide (LOZOL) 2.5 MG tablet   Oral   Take 2.5 mg by mouth daily.         . methocarbamol (ROBAXIN) 750 MG tablet   Oral   Take 750 mg by mouth 4 (four) times daily.         . metoprolol succinate (TOPROL-XL) 50 MG 24 hr tablet   Oral   Take 50 mg by mouth daily. Take with or  immediately following a meal.         . pantoprazole (PROTONIX) 40 MG tablet   Oral   Take 40 mg by mouth daily.          BP 186/86  Pulse 75  Temp(Src) 97.5 F (36.4 C) (Oral)  Resp 16  SpO2 99% Physical Exam  Nursing note and vitals reviewed. Constitutional: She is oriented to person, place, and time. She appears well-developed and well-nourished. No distress.  HENT:  Head: Normocephalic and atraumatic.  Eyes: EOM are normal.  Neck: Neck supple. No tracheal deviation present.  Cardiovascular: Normal rate, regular rhythm and normal heart sounds.   No swelling in the legs. Feet are moving well.  Pulmonary/Chest: Effort normal and breath sounds normal. No respiratory distress.  Abdominal: Bowel sounds are normal. There is no tenderness.  Musculoskeletal: Normal range of motion.  Neurological: She is alert and oriented to person, place, and time.  No cranial nerve deficit. She exhibits normal muscle tone. Coordination normal.  Strength and coordination normal.  Skin: Skin is warm and dry.  Psychiatric: She has a normal mood and affect. Her behavior is normal.    ED Course  Procedures (including critical care time) Medications - No data to display  DIAGNOSTIC STUDIES: Oxygen Saturation is 99% on RA, normal by my interpretation.    COORDINATION OF CARE: 6:36 PM- Discussed treatment plan with pt which includes head CT, blood work, UA, and EKG. Pt agrees to plan.   Results for orders placed during the hospital encounter of 10/18/12  ETHANOL      Result Value Range   Alcohol, Ethyl (B) <11  0 - 11 mg/dL  PROTIME-INR      Result Value Range   Prothrombin Time 12.3  11.6 - 15.2 seconds   INR 0.93  0.00 - 1.49  APTT      Result Value Range   aPTT 30  24 - 37 seconds  CBC      Result Value Range   WBC 8.1  4.0 - 10.5 K/uL   RBC 4.14  3.87 - 5.11 MIL/uL   Hemoglobin 12.4  12.0 - 15.0 g/dL   HCT 81.1 (*) 91.4 - 78.2 %   MCV 86.2  78.0 - 100.0 fL   MCH 30.0  26.0 - 34.0 pg   MCHC 34.7  30.0 - 36.0 g/dL   RDW 95.6  21.3 - 08.6 %   Platelets 360  150 - 400 K/uL  DIFFERENTIAL      Result Value Range   Neutrophils Relative % 56  43 - 77 %   Neutro Abs 4.5  1.7 - 7.7 K/uL   Lymphocytes Relative 32  12 - 46 %   Lymphs Abs 2.6  0.7 - 4.0 K/uL   Monocytes Relative 10  3 - 12 %   Monocytes Absolute 0.8  0.1 - 1.0 K/uL   Eosinophils Relative 2  0 - 5 %   Eosinophils Absolute 0.1  0.0 - 0.7 K/uL   Basophils Relative 0  0 - 1 %   Basophils Absolute 0.0  0.0 - 0.1 K/uL  COMPREHENSIVE METABOLIC PANEL      Result Value Range   Sodium 128 (*) 135 - 145 mEq/L   Potassium 3.6  3.5 - 5.1 mEq/L   Chloride 88 (*) 96 - 112 mEq/L   CO2 30  19 - 32 mEq/L   Glucose, Bld 125 (*) 70 - 99 mg/dL   BUN 9  6 -  23 mg/dL   Creatinine, Ser 4.09  0.50 - 1.10 mg/dL   Calcium 81.1  8.4 - 91.4 mg/dL   Total Protein 7.7  6.0 - 8.3 g/dL   Albumin  4.2  3.5 - 5.2 g/dL   AST 18  0 - 37 U/L   ALT 8  0 - 35 U/L   Alkaline Phosphatase 96  39 - 117 U/L   Total Bilirubin 0.3  0.3 - 1.2 mg/dL   GFR calc non Af Amer 84 (*) >90 mL/min   GFR calc Af Amer >90  >90 mL/min  TROPONIN I      Result Value Range   Troponin I <0.30  <0.30 ng/mL  URINE RAPID DRUG SCREEN (HOSP PERFORMED)      Result Value Range   Opiates POSITIVE (*) NONE DETECTED   Cocaine NONE DETECTED  NONE DETECTED   Benzodiazepines NONE DETECTED  NONE DETECTED   Amphetamines NONE DETECTED  NONE DETECTED   Tetrahydrocannabinol NONE DETECTED  NONE DETECTED   Barbiturates NONE DETECTED  NONE DETECTED  URINALYSIS, ROUTINE W REFLEX MICROSCOPIC      Result Value Range   Color, Urine YELLOW  YELLOW   APPearance CLEAR  CLEAR   Specific Gravity, Urine 1.010  1.005 - 1.030   pH 5.5  5.0 - 8.0   Glucose, UA NEGATIVE  NEGATIVE mg/dL   Hgb urine dipstick NEGATIVE  NEGATIVE   Bilirubin Urine NEGATIVE  NEGATIVE   Ketones, ur NEGATIVE  NEGATIVE mg/dL   Protein, ur NEGATIVE  NEGATIVE mg/dL   Urobilinogen, UA 0.2  0.0 - 1.0 mg/dL   Nitrite NEGATIVE  NEGATIVE   Leukocytes, UA NEGATIVE  NEGATIVE  GLUCOSE, CAPILLARY      Result Value Range   Glucose-Capillary 124 (*) 70 - 99 mg/dL  POCT I-STAT, CHEM 8      Result Value Range   Sodium 128 (*) 135 - 145 mEq/L   Potassium 3.6  3.5 - 5.1 mEq/L   Chloride 90 (*) 96 - 112 mEq/L   BUN 9  6 - 23 mg/dL   Creatinine, Ser 7.82  0.50 - 1.10 mg/dL   Glucose, Bld 956 (*) 70 - 99 mg/dL   Calcium, Ion 2.13  0.86 - 1.30 mmol/L   TCO2 27  0 - 100 mmol/L   Hemoglobin 13.9  12.0 - 15.0 g/dL   HCT 57.8  46.9 - 62.9 %   Ct Head Wo Contrast  10/18/2012   CLINICAL DATA:  2 episodes of a ectasia.  EXAM: CT HEAD WITHOUT CONTRAST  TECHNIQUE: Contiguous axial images were obtained from the base of the skull through the vertex without intravenous contrast.  COMPARISON:  None.  FINDINGS: Small left subdural hematoma measuring 8 mm in greatest thickness extends  over the left antral lateral frontal lobe and minimally over the left temporal lobe. This is nearly isodense to white matter consistent with a subacute subdural hematoma. It flattens the underlying gyri and partly effaces underlying sulci. It causes a mild midline shift to the right of 3 mm.  No other intracranial hemorrhage.  The ventricles are normal in size, for this patient's age, and normal in configuration. There are no parenchymal masses. There is no evidence of a cortical infarct. There are no other extra-axial abnormalities.  No skull fracture or lesion.  Mild ethmoid sinus mucosal thickening.  IMPRESSION: Subacute left subdural hematoma extends over the left antral lateral frontal lobe and minimally over the left anterior temporal lobe. It measures 8  mm in thickness and causes mild mass effect and 3 mm of midline shift to the right.  No other intracranial hemorrhage. No evidence of a cortical infarct.   Electronically Signed   By: Amie Portland M.D.   On: 10/18/2012 19:36        EKG Interpretation     Ventricular Rate:  85 PR Interval:  182 QRS Duration: 84 QT Interval:  364 QTC Calculation: 433 R Axis:   59 Text Interpretation:  Normal sinus rhythm Possible Left atrial enlargement Borderline ECG When compared with ECG of 15-Sep-2008 14:16, No significant change was found            MDM   1. Subdural hematoma    The patient with a fall in October 1. The subdural is clearly related to that this is more subacute chronic in nature. The patient's symptoms of dysphagia are more consistent with TIA there has been nothing that seems to be like a seizure. Discuss with neurology on-call down to [facility]. They recommended admission if approved by neurosurgery. Discuss with Dr. Venetia Maxon from neurosurgery he said that nothing to be done now patient can be admitted up here recommended admission and neurology consult. Will start the patient on Reducing Casey's or seizure activities that was  neurology's recommendation. Here the patient is completely stable no focal neuro deficits no significant complaints nontoxic.     I personally performed the services described in this documentation, which was scribed in my presence. The recorded information has been reviewed and is accurate.     Shelda Jakes, MD 10/18/12 2122

## 2012-10-18 NOTE — ED Notes (Signed)
Patient fell hitting head on Oct. 1  And saw Megan Mans that day.  Only had bruise and lac.  This past weekend had difficulty forming words for a brief spell.  Saw MD on Monday and CT scan scheduled for tomorrow.  Today had another episode of difficulty speaking - forming words. Patient states she knew what she wanted to say, but couldn't form words.  No other weakness, vision blurring.  Occassional headache.

## 2012-10-19 ENCOUNTER — Observation Stay (HOSPITAL_COMMUNITY)
Admit: 2012-10-19 | Discharge: 2012-10-19 | Disposition: A | Discharge status: Home / Self Care | Payer: Medicare Other | Attending: Neurology | Admitting: Neurology

## 2012-10-19 ENCOUNTER — Encounter (HOSPITAL_COMMUNITY): Payer: Self-pay | Admitting: *Deleted

## 2012-10-19 ENCOUNTER — Inpatient Hospital Stay (HOSPITAL_COMMUNITY): Payer: Medicare Other

## 2012-10-19 ENCOUNTER — Ambulatory Visit (HOSPITAL_COMMUNITY): Payer: Medicare Other

## 2012-10-19 DIAGNOSIS — I517 Cardiomegaly: Secondary | ICD-10-CM

## 2012-10-19 LAB — LIPID PANEL
Cholesterol: 185 mg/dL (ref 0–200)
HDL: 118 mg/dL (ref >39)
LDL Cholesterol: 58 mg/dL (ref 0–99)
Total CHOL/HDL Ratio: 1.6 RATIO
Triglycerides: 46 mg/dL (ref <150)

## 2012-10-19 LAB — HEMOGLOBIN A1C: Hgb A1c MFr Bld: 6.1 % — ABNORMAL HIGH (ref <5.7)

## 2012-10-19 MED ORDER — PANTOPRAZOLE SODIUM 40 MG PO TBEC
40.0000 mg | DELAYED_RELEASE_TABLET | Freq: Every day | ORAL | Status: DC
  Administered 2012-10-19 – 2012-10-21 (×3): 40 mg via ORAL
  Filled 2012-10-19 (×3): qty 1

## 2012-10-19 MED ORDER — LEVETIRACETAM 500 MG PO TABS
250.0000 mg | ORAL_TABLET | Freq: Two times a day (BID) | ORAL | Status: DC
  Administered 2012-10-19 – 2012-10-21 (×5): 250 mg via ORAL
  Filled 2012-10-19 (×6): qty 1

## 2012-10-19 MED ORDER — LEVETIRACETAM 500 MG PO TABS: 500.0000 mg | ORAL_TABLET | Freq: Two times a day (BID) | ORAL | Status: DC

## 2012-10-19 MED ORDER — HYDROCODONE-ACETAMINOPHEN 7.5-325 MG PO TABS
1.0000 | ORAL_TABLET | Freq: Four times a day (QID) | ORAL | Status: DC | PRN
  Administered 2012-10-19 – 2012-10-21 (×6): 1 via ORAL
  Filled 2012-10-19 (×6): qty 1

## 2012-10-19 MED ORDER — ACETAMINOPHEN 325 MG PO TABS
650.0000 mg | ORAL_TABLET | ORAL | Status: DC | PRN
  Administered 2012-10-19: 650 mg via ORAL
  Filled 2012-10-19 (×3): qty 2

## 2012-10-19 MED ORDER — ALPRAZOLAM 0.5 MG PO TABS
0.5000 mg | ORAL_TABLET | Freq: Three times a day (TID) | ORAL | Status: DC | PRN
  Administered 2012-10-19 – 2012-10-20 (×3): 0.5 mg via ORAL
  Filled 2012-10-19 (×4): qty 1

## 2012-10-19 MED ORDER — SODIUM CHLORIDE 0.9 % IV SOLN: INTRAVENOUS | Status: DC

## 2012-10-19 NOTE — Progress Notes (Signed)
*  PRELIMINARY RESULTS* Echocardiogram 2D Echocardiogram has been performed.  Bilan Tedesco 10/19/2012, 10:07 AM

## 2012-10-19 NOTE — Progress Notes (Signed)
UR Chart Review Completed  

## 2012-10-19 NOTE — Progress Notes (Signed)
NAME:  Beverly [name], WEITMAN                 ACCOUNT NO.:  [id number]  MEDICAL RECORD NO.:  [id number]  LOCATION:  A339                          FACILITY:  [facility]  PHYSICIAN:  Jaidin Richison G. Renard Matter, MD   DATE OF BIRTH:  [birth year]  DATE OF PROCEDURE: DATE OF DISCHARGE:                                PROGRESS NOTE   This patient was admitted with a  episodes of difficulty with speech. She had a fall at home with slight head injury some 5 days prior to this admission.  In the ED, CT of the head showed small left subdural hematoma.  Apparently, Dr. Venetia Maxon of [facility] did not recommend surgery this time, but further workup and evaluation, possible TIA.  The patient was alert and oriented this a.m.  OBJECTIVE:  VITAL SIGNS:  Blood pressure 130/80, respirations 16, pulse 78, temp 97.3. HEENT:  Eyes, PERRLA.  TMs negative.  Oropharynx benign. NECK:  Supple.  No JVD or thyroid abnormalities. HEART:  Regular rhythm.  No murmurs. LUNGS:  Clear to P and A. ABDOMEN:  No palpable organs or masses. NEUROLOGICAL:  Cranial nerves intact.  No motor or sensory deficits.  ASSESSMENT:  The patient has small subdural hematoma, left anterolateral frontal lobe, hypertension, and hyponatremia.  PLAN:  To proceed with MRI and MRA of the brain, 2D echo, carotid Dopplers.  Also neurology consult.  Continue to monitor BMP.     Yusef Lamp G. Renard Matter, MD     AGM/MEDQ  D:  10/19/2012  T:  10/19/2012  Job:  [id number]

## 2012-10-19 NOTE — Progress Notes (Signed)
Notified Dr. Ardelle Balls to ask him since the patient had passed her swallow screen and neurologist seen her, and no test currently ordered that required her to be NPO, could we advanced her diet.  New orders given and followed.

## 2012-10-19 NOTE — Progress Notes (Signed)
Notified Dr. Janna Arch of the patients results documented today.  He voiced for me to notify Dr. Gerilyn Pilgrim.  Dr. Gerilyn Pilgrim notified of the patients test results today and he stated he saw them this am.  He stated that the results  really did not change from the previous CT result on yesterday.  According him the H&P stated that Dr. Venetia Maxon the [facility] surgeon recommended no surgery at this time, and followup in the office after discharge.  I asked him when he thought the patient could be discharged and her stated probably tomorrow.  I asked him if I could discuss the amove statements with the patient and family and he stated it was okay.  So I discussed the above with them and stated when she goes home Dr. Renard Matter would discharge her which he would be in the am.   She is currently in stable condition in her room talking with her husband and daughter.

## 2012-10-19 NOTE — Consult Note (Signed)
[facility] Beverly Staton A. Gerilyn Pilgrim, MD     [email or web address]          Beverly [name] is an 79 y.o. female.   ASSESSMENT/PLAN:  Spell of word finding difficulties occurring about 3 times. I believe the most likely etiology is from the subacute subdural hematoma are on the left side. Seizures are a possibility although I think this less likely. TIA may also be a possibility given her age and risk factors. The patient has been started on antiepileptic medication. I do have concerned about this given her gait impairment and recent fall. The patient does not meet the criteria for epilepsy and probably should not be placed on long-term antiepileptic medications. We will reduce dose from 500 mg to 250 mg twice a day. An EEG will be done. The EEG is fine I would suggest weaning off the Keppra after about a month. Repeat scan can be done at that time. The scan is fine, she can be placed on antiplatelet agents.   The patient is a 79 year old white female who fell about 2 weeks ago. She did hit the front of her head on the left side. She did not lose consciousness. She has not felt well although she did have some visible left frontal injuries. She tells her that she fell while going to the kitchen. She did not trip over anything although she reports that she did not have her shoes on. She tells me that her legs are unequal status post surgeries. She had 3 spells where she had word finding difficulties. The spells lasted for a few seconds and up to a couple minutes. The patient does not report associated focal numbness or weakness. She denies any headaches. She does have pain in multiple locations from osteoarthritis. There is no dyspnea, shortness of breath, chest pain, GI symptoms, GU symptoms. The patient decided to seek medical attention at all because of the word finding difficulties. She has never had problems like this previously.  GENERAL: This is a pleasant female in no acute distress.  HEENT:   Neck is supple. There is small bruise involving the left frontal area/superior orbital area.  ABDOMEN: soft  EXTREMITIES: No edema. There is significant arthritic changes of knees. She status post bilateral total knee orthoplasty.   BACK: Unremarkable.  SKIN: Normal by inspection.    MENTAL STATUS: Alert and oriented. Speech, language and cognition are generally intact. Judgment and insight normal.   CRANIAL NERVES: There is a right a friend pupillary defect with the right pupil being somewhat elliptically shaped approximately 5 mm and less reactive than the left which is round and 4 mm; extra ocular movements are full, there is no significant nystagmus; visual fields are full; upper and lower facial muscles are normal in strength and symmetric, there is no flattening of the nasolabial folds; tongue is midline; uvula is midline; shoulder elevation is normal.  MOTOR: Normal tone, bulk and strength; no pronator drift.  COORDINATION: Left finger to nose is normal, right finger to nose is normal, No rest tremor; no intention tremor; no postural tremor; no bradykinesia.  REFLEXES: Deep tendon reflexes are symmetrical and normal. Plantar responses are flexor bilaterally.   SENSATION: Normal to light touch, temperature, and pinprick.  GAIT: She stands with minimal assistance and has good strides. Gait is essentially normal.    Past Medical History  Diagnosis Date  . Arthritis   . Hearing loss, central     Left ear    Past Surgical History  Procedure Laterality Date  . Joint replacement Bilateral     knees  . Abdominal hysterectomy    . Appendectomy      History reviewed. No pertinent family history.  Social History:  reports that she has never smoked. She has never used smokeless tobacco. She reports that she does not drink alcohol or use illicit drugs.  Allergies:  Allergies  Allergen Reactions  . Penicillins Itching    Medications: Prior to Admission medications     Medication Sig Start Date End Date Taking? Authorizing Provider  ALPRAZolam Prudy Feeler) 0.5 MG tablet Take 0.5 mg by mouth 3 (three) times daily as needed for anxiety.   Yes Historical Provider, MD  fluticasone (FLONASE) 50 MCG/ACT nasal spray Place 1 spray into the nose daily. 09/18/12  Yes Historical Provider, MD  HYDROcodone-acetaminophen (NORCO) 7.5-325 MG per tablet Take 1 tablet by mouth every 4 (four) hours as needed for pain.   Yes Historical Provider, MD  indapamide (LOZOL) 2.5 MG tablet Take 2.5 mg by mouth daily.   Yes Historical Provider, MD  methocarbamol (ROBAXIN) 750 MG tablet Take 750 mg by mouth 4 (four) times daily.   Yes Historical Provider, MD  metoprolol succinate (TOPROL-XL) 50 MG 24 hr tablet Take 50 mg by mouth daily. Take with or immediately following a meal.   Yes Historical Provider, MD  pantoprazole (PROTONIX) 40 MG tablet Take 40 mg by mouth daily.   Yes Historical Provider, MD     Scheduled Meds: . levETIRAcetam  500 mg Oral BID  . pantoprazole  40 mg Oral Daily   Continuous Infusions: . sodium chloride     PRN Meds:.acetaminophen, ALPRAZolam, HYDROcodone-acetaminophen    Blood pressure 130/80, pulse 78, temperature 97.3 F (36.3 C), temperature source Oral, resp. rate 16, SpO2 98.00%.   Results for orders placed during the hospital encounter of 10/18/12 (from the past 48 hour(s))  GLUCOSE, CAPILLARY     Status: Abnormal   Collection Time    10/18/12  7:05 PM      Result Value Range   Glucose-Capillary 124 (*) 70 - 99 mg/dL  ETHANOL     Status: None   Collection Time    10/18/12  7:10 PM      Result Value Range   Alcohol, Ethyl (B) <11  0 - 11 mg/dL   Comment:            LOWEST DETECTABLE LIMIT FOR     SERUM ALCOHOL IS 11 mg/dL     FOR MEDICAL PURPOSES ONLY  PROTIME-INR     Status: None   Collection Time    10/18/12  7:10 PM      Result Value Range   Prothrombin Time 12.3  11.6 - 15.2 seconds   INR 0.93  0.00 - 1.49  APTT     Status: None    Collection Time    10/18/12  7:10 PM      Result Value Range   aPTT 30  24 - 37 seconds  CBC     Status: Abnormal   Collection Time    10/18/12  7:10 PM      Result Value Range   WBC 8.1  4.0 - 10.5 K/uL   RBC 4.14  3.87 - 5.11 MIL/uL   Hemoglobin 12.4  12.0 - 15.0 g/dL   HCT 40.9 (*) 81.1 - 91.4 %   MCV 86.2  78.0 - 100.0 fL   MCH 30.0  26.0 - 34.0 pg   MCHC 34.7  30.0 - 36.0 g/dL   RDW 16.1  09.6 - 04.5 %   Platelets 360  150 - 400 K/uL  DIFFERENTIAL     Status: None   Collection Time    10/18/12  7:10 PM      Result Value Range   Neutrophils Relative % 56  43 - 77 %   Neutro Abs 4.5  1.7 - 7.7 K/uL   Lymphocytes Relative 32  12 - 46 %   Lymphs Abs 2.6  0.7 - 4.0 K/uL   Monocytes Relative 10  3 - 12 %   Monocytes Absolute 0.8  0.1 - 1.0 K/uL   Eosinophils Relative 2  0 - 5 %   Eosinophils Absolute 0.1  0.0 - 0.7 K/uL   Basophils Relative 0  0 - 1 %   Basophils Absolute 0.0  0.0 - 0.1 K/uL  COMPREHENSIVE METABOLIC PANEL     Status: Abnormal   Collection Time    10/18/12  7:10 PM      Result Value Range   Sodium 128 (*) 135 - 145 mEq/L   Potassium 3.6  3.5 - 5.1 mEq/L   Chloride 88 (*) 96 - 112 mEq/L   CO2 30  19 - 32 mEq/L   Glucose, Bld 125 (*) 70 - 99 mg/dL   BUN 9  6 - 23 mg/dL   Creatinine, Ser 4.09  0.50 - 1.10 mg/dL   Calcium 81.1  8.4 - 91.4 mg/dL   Total Protein 7.7  6.0 - 8.3 g/dL   Albumin 4.2  3.5 - 5.2 g/dL   AST 18  0 - 37 U/L   ALT 8  0 - 35 U/L   Alkaline Phosphatase 96  39 - 117 U/L   Total Bilirubin 0.3  0.3 - 1.2 mg/dL   GFR calc non Af Amer 84 (*) >90 mL/min   GFR calc Af Amer >90  >90 mL/min   Comment: (NOTE)     The eGFR has been calculated using the CKD EPI equation.     This calculation has not been validated in all clinical situations.     eGFR's persistently <90 mL/min signify possible Chronic Kidney     Disease.  TROPONIN I     Status: None   Collection Time    10/18/12  7:10 PM      Result Value Range   Troponin I <0.30  <0.30  ng/mL   Comment:            Due to the release kinetics of cTnI,     a negative result within the first hours     of the onset of symptoms does not rule out     myocardial infarction with certainty.     If myocardial infarction is still suspected,     repeat the test at appropriate intervals.  POCT I-STAT, CHEM 8     Status: Abnormal   Collection Time    10/18/12  7:25 PM      Result Value Range   Sodium 128 (*) 135 - 145 mEq/L   Potassium 3.6  3.5 - 5.1 mEq/L   Chloride 90 (*) 96 - 112 mEq/L   BUN 9  6 - 23 mg/dL   Creatinine, Ser 7.82  0.50 - 1.10 mg/dL   Glucose, Bld 956 (*) 70 - 99 mg/dL   Calcium, Ion 2.13  0.86 - 1.30 mmol/L   TCO2 27  0 - 100 mmol/L   Hemoglobin 13.9  12.0 - 15.0 g/dL   HCT 40.9  81.1 - 91.4 %  URINE RAPID DRUG SCREEN (HOSP PERFORMED)     Status: Abnormal   Collection Time    10/18/12  7:44 PM      Result Value Range   Opiates POSITIVE (*) NONE DETECTED   Cocaine NONE DETECTED  NONE DETECTED   Benzodiazepines NONE DETECTED  NONE DETECTED   Amphetamines NONE DETECTED  NONE DETECTED   Tetrahydrocannabinol NONE DETECTED  NONE DETECTED   Barbiturates NONE DETECTED  NONE DETECTED   Comment:            DRUG SCREEN FOR MEDICAL PURPOSES     ONLY.  IF CONFIRMATION IS NEEDED     FOR ANY PURPOSE, NOTIFY LAB     WITHIN 5 DAYS.                LOWEST DETECTABLE LIMITS     FOR URINE DRUG SCREEN     Drug Class       Cutoff (ng/mL)     Amphetamine      1000     Barbiturate      200     Benzodiazepine   200     Tricyclics       300     Opiates          300     Cocaine          300     THC              50  URINALYSIS, ROUTINE W REFLEX MICROSCOPIC     Status: None   Collection Time    10/18/12  7:44 PM      Result Value Range   Color, Urine YELLOW  YELLOW   APPearance CLEAR  CLEAR   Specific Gravity, Urine 1.010  1.005 - 1.030   pH 5.5  5.0 - 8.0   Glucose, UA NEGATIVE  NEGATIVE mg/dL   Hgb urine dipstick NEGATIVE  NEGATIVE   Bilirubin Urine NEGATIVE   NEGATIVE   Ketones, ur NEGATIVE  NEGATIVE mg/dL   Protein, ur NEGATIVE  NEGATIVE mg/dL   Urobilinogen, UA 0.2  0.0 - 1.0 mg/dL   Nitrite NEGATIVE  NEGATIVE   Leukocytes, UA NEGATIVE  NEGATIVE   Comment: MICROSCOPIC NOT DONE ON URINES WITH NEGATIVE PROTEIN, BLOOD, LEUKOCYTES, NITRITE, OR GLUCOSE <1000 mg/dL.  LIPID PANEL     Status: None   Collection Time    10/19/12  6:17 AM      Result Value Range   Cholesterol 185  0 - 200 mg/dL   Triglycerides 46  <782 mg/dL   HDL 956  >21 mg/dL   Total CHOL/HDL Ratio 1.6     VLDL 9  0 - 40 mg/dL   LDL Cholesterol 58  0 - 99 mg/dL   Comment:            Total Cholesterol/HDL:CHD Risk     Coronary Heart Disease Risk Table                         Men   Women      1/2 Average Risk   3.4   3.3      Average Risk       5.0   4.4      2 X Average Risk   9.6   7.1      3 X Average Risk  23.4  11.0                Use the calculated Patient Ratio     above and the CHD Risk Table     to determine the patient's CHD Risk.                ATP III CLASSIFICATION (LDL):      <100     mg/dL   Optimal      409-811  mg/dL   Near or Above                        Optimal      130-159  mg/dL   Borderline      914-782  mg/dL   High      >956     mg/dL   Very High    Ct Head Wo Contrast  10/18/2012   CLINICAL DATA:  2 episodes of a ectasia.  EXAM: CT HEAD WITHOUT CONTRAST  TECHNIQUE: Contiguous axial images were obtained from the base of the skull through the vertex without intravenous contrast.  COMPARISON:  None.  FINDINGS: Small left subdural hematoma measuring 8 mm in greatest thickness extends over the left antral lateral frontal lobe and minimally over the left temporal lobe. This is nearly isodense to white matter consistent with a subacute subdural hematoma. It flattens the underlying gyri and partly effaces underlying sulci. It causes a mild midline shift to the right of 3 mm.  No other intracranial hemorrhage.  The ventricles are normal in size, for this  patient's age, and normal in configuration. There are no parenchymal masses. There is no evidence of a cortical infarct. There are no other extra-axial abnormalities.  No skull fracture or lesion.  Mild ethmoid sinus mucosal thickening.  IMPRESSION: Subacute left subdural hematoma extends over the left antral lateral frontal lobe and minimally over the left anterior temporal lobe. It measures 8 mm in thickness and causes mild mass effect and 3 mm of midline shift to the right.  No other intracranial hemorrhage. No evidence of a cortical infarct.   Electronically Signed   By: Amie Portland M.D.   On: 10/18/2012 19:36   US Carotid Duplex Bilateral  10/19/2012   CLINICAL DATA:  Possible TIA, history hypertension, arrhythmia  EXAM: BILATERAL CAROTID DUPLEX ULTRASOUND  TECHNIQUE: Wallace Cullens scale imaging, color Doppler and duplex ultrasound were performed of bilateral carotid and vertebral arteries in the neck.  COMPARISON:  04/16/2012  FINDINGS: Criteria: Quantification of carotid stenosis is based on velocity parameters that correlate the residual internal carotid diameter with NASCET-based stenosis levels, using the diameter of the distal internal carotid lumen as the denominator for stenosis measurement.  The following velocity measurements were obtained:  RIGHT  ICA:  98/19 cm/sec  CCA:  102/12 cm/sec  SYSTOLIC ICA/CCA RATIO:  0.96  DIASTOLIC ICA/CCA RATIO:  1.59  ECA:  113 cm/sec  LEFT  ICA:  88/16 cm/sec  CCA:  115/14 cm/sec  SYSTOLIC ICA/CCA RATIO:  0.77  DIASTOLIC ICA/CCA RATIO:  1.12  ECA:  118 cm/sec  RIGHT CAROTID ARTERY: Intimal thickening right CCA. Noncalcified plaque right CCA with a combination of noncalcified and calcified shadowing plaque at right carotid bulb extending into proximal right ICA. Laminar flow on color Doppler imaging with minimal spectral broadening on waveform analysis. Intermittent arrhythmia. No high velocity jets.  RIGHT VERTEBRAL ARTERY:  Patent, antegrade  LEFT CAROTID ARTERY:  Intimal thickening and minimal hypoechoic plaque throughout left  CCA. More prominent hypoechoic plaque with additional calcified non shadowing plaques at left carotid bulb extending into proximal left ICA and ECA. Laminar flow on color Doppler imaging. No high velocity jets. Intermittently rhythmic.  LEFT VERTEBRAL ARTERY:  Patent, antegrade  IMPRESSION: Plaque formation in the carotid systems bilaterally.  Velocity measurements correlate with less than 50% diameter stenoses bilaterally.  Intermittent arrhythmia.   Electronically Signed   By: Ulyses Southward M.D.   On: 10/19/2012 08:16        Kannon Baum A. Gerilyn [name], M.D.  Diplomate, Biomedical engineer of Psychiatry and Neurology ( Neurology). 10/19/2012, 8:38 AM

## 2012-10-19 NOTE — Progress Notes (Signed)
EEG Completed; Results Pending  

## 2012-10-19 NOTE — Care Management Note (Signed)
    Page 1 of 1   10/19/2012     11:09:07 AM   CARE MANAGEMENT NOTE 10/19/2012  Patient:  Beverly [name], Beverly [name]   Account Number:  [id number]  Date Initiated:  10/19/2012  Documentation initiated by:  Rosemary Holms  Subjective/Objective Assessment:   Pt admitted from home where she lives with her spouse. Unable to speak with her much more due to headache. No HH needs identified at this time     Action/Plan:   Anticipated DC Date:  10/20/2012   Anticipated DC Plan:  HOME/SELF CARE      DC Planning Services  CM consult      Choice offered to / List presented to:             Status of service:  Completed, signed off Medicare Important Message given?   (If response is "NO", the following Medicare IM given date fields will be blank) Date Medicare IM given:   Date Additional Medicare IM given:    Discharge Disposition:    Per UR Regulation:    If discussed at Long Length of Stay Meetings, dates discussed:    Comments:  10/19/12 Rosemary Holms RN BSN CM

## 2012-10-20 LAB — CBC
MCHC: 34.5 g/dL (ref 30.0–36.0)
MCV: 86.1 fL (ref 78.0–100.0)
RBC: 4.24 MIL/uL (ref 3.87–5.11)
RDW: 13.9 % (ref 11.5–15.5)

## 2012-10-20 NOTE — Progress Notes (Signed)
NAME:  Beverly [name], Beverly [name]                 ACCOUNT NO.:  [id number]  MEDICAL RECORD NO.:  [id number]  LOCATION:  A339                          FACILITY:  [facility]  PHYSICIAN:  Edmar Blankenburg G. Renard Matter, MD   DATE OF BIRTH:  1932-02-24  DATE OF PROCEDURE: DATE OF DISCHARGE:                                PROGRESS NOTE   SUBJECTIVE:  This patient is alert and oriented today.  She did have MRI and MRA done and ultrasound of carotid arteries yesterday.  The MRI showed extensive left subdural hematoma extending over the convexity, probable small subdural hemorrhage as well. 2.5 mm left to right midline shift at foramen of Monro.  OBJECTIVE:  VITAL SIGNS:  Blood pressure 157/49, respirations 18, pulse 93, temp 98. HEENT:  Eyes, PERRLA.  TMs negative.  Oropharynx benign. NECK:  Supple.  No JVD or thyroid abnormalities.  No carotid bruit. HEART:  Regular rhythm.  No murmurs. LUNGS:  Clear to P and A. ABDOMEN:  No palpable organs or masses. NEUROLOGICAL:  Cranial nerves intact.  No motor or sensory deficits.  ASSESSMENT:  The patient has left subdural hematoma involving the anterior frontal lobe, hypertension and hyponatremia.  PLAN:  To continue current conservative treatment at this point.  She is being seen by Neurology and at discharge will be seeing Dr. Venetia Maxon, neurosurgeon in [place] and followup plan to gradually ambulate the patient in the room.  Continue to monitor the vital signs etc.     Oziah Vitanza G. Renard Matter, MD     AGM/MEDQ  D:  10/20/2012  T:  10/20/2012  Job:  [id number]

## 2012-10-21 LAB — BASIC METABOLIC PANEL
CO2: 33 mEq/L — ABNORMAL HIGH (ref 19–32)
CO2: 30 mEq/L (ref 19–32)
Calcium: 9.5 mg/dL (ref 8.4–10.5)
Chloride: 89 mEq/L — ABNORMAL LOW (ref 96–112)
Chloride: 92 mEq/L — ABNORMAL LOW (ref 96–112)
Chloride: 93 mEq/L — ABNORMAL LOW (ref 96–112)
Creatinine, Ser: 0.56 mg/dL (ref 0.50–1.10)
GFR calc Af Amer: >90 mL/min (ref >90)
GFR calc Af Amer: >90 mL/min (ref >90)
GFR calc Af Amer: >90 mL/min (ref >90)
GFR calc non Af Amer: 87 mL/min — ABNORMAL LOW (ref >90)
Glucose, Bld: 103 mg/dL — ABNORMAL HIGH (ref 70–99)
Potassium: 3 mEq/L — ABNORMAL LOW (ref 3.5–5.1)
Potassium: 3 mEq/L — ABNORMAL LOW (ref 3.5–5.1)
Potassium: 3.6 mEq/L (ref 3.5–5.1)
Sodium: 132 mEq/L — ABNORMAL LOW (ref 135–145)
Sodium: 130 mEq/L — ABNORMAL LOW (ref 135–145)

## 2012-10-21 MED ORDER — SODIUM CHLORIDE 0.9 % IV BOLUS (SEPSIS): 250.0000 mL | Freq: Once | INTRAVENOUS | Status: DC

## 2012-10-21 MED ORDER — LEVETIRACETAM 250 MG PO TABS: 250.0000 mg | ORAL_TABLET | Freq: Two times a day (BID) | ORAL | Status: DC

## 2012-10-21 MED ORDER — POTASSIUM CHLORIDE 10 MEQ/100ML IV SOLN
10.0000 meq | INTRAVENOUS | Status: AC
  Administered 2012-10-21 (×3): 10 meq via INTRAVENOUS
  Filled 2012-10-21: qty 100

## 2012-10-21 NOTE — Discharge Summary (Signed)
NAME:  MERCER, STALLWORTH                 ACCOUNT NO.:  [id number]  MEDICAL RECORD NO.:  [id number]  LOCATION:  A339                          FACILITY:  [facility]  PHYSICIAN:  Rayjon Wery G. Renard Matter, MD   DATE OF BIRTH:  08-25-1932  DATE OF ADMISSION:  10/18/2012 DATE OF DISCHARGE:  10/19/2014LH                              DISCHARGE SUMMARY   ADDENDUM:  The patient will be discharged on the following medications: Keppra 250 mg b.i.d., KCl 20 mEq daily.  She is to continue the following medications:  Norco 7.5/325 one every 4 hours as needed for pain, Robaxin 750 mg q.i.d., Lozol 2.5 mg daily, Xanax 0.5 mg t.i.d. as needed for anxiety, Toprol-XL 50 mg daily, Protonix 40 mg daily, Flonase 50 mcg/__________ nasal spray, 1 spray to nose daily.  The patient is to return to the office for followup.     Haddon Fyfe G. Renard Matter, MD     AGM/MEDQ  D:  10/21/2012  T:  10/21/2012  Job:  [id number]

## 2012-10-21 NOTE — Progress Notes (Signed)
Patient refused iv fluids during the night.  Patient stated if md said she needed iv fluids, she would allow them to run tomorrow (Sunday).

## 2012-10-21 NOTE — Discharge Summary (Signed)
NAME:  Beverly [name], Beverly [name]                 ACCOUNT NO.:  [id number]  MEDICAL RECORD NO.:  [id number]  LOCATION:  A339                          FACILITY:  [facility]  PHYSICIAN:  Erisha Paugh G. Renard Matter, MD   DATE OF BIRTH:  1932/09/07  DATE OF ADMISSION:  10/18/2012 DATE OF DISCHARGE:  LH                              DISCHARGE SUMMARY   This 79 year old white female admitted on October 18, 2012, discharged October 21, 2012, 3 days hospitalization.  DIAGNOSES: 1. Subdural hematoma over the left anterolateral lobe and left     temporal lobe. 2. Transient ischemic attack. 3. Hypertension, essential. 4. Hyponatremia.  CONDITION:  Stable at the time of her discharge.  HISTORY:  This patient fell at home 2 weeks ago and had minor head injury.  She had episodes of inability to formulate right words. She was seen by ED physician.  Head CT scan showed small left subdural hematoma. Neuro Surgery, was consulted.  Dr. Venetia Maxon recommended no surgery, could followup in his office after discharge.  She had no episodes of weakness.  No seizures.  She was given dose of Keppra in ED as a preventive measure for possible seizure.  PHYSICAL EXAMINATION:  VITAL SIGNS:  On admission, alert patient with blood pressure 157/72, pulse 95, temperature 97.9. HEENT:  Eyes, PERRLA.  TM negative.  Oropharynx benign. NECK:  Supple.  No JVD or thyroid abnormalities. HEART:  Regular rhythm. BREAST:  There is a small bruise over the left breast with small nodular area in the left upper quadrant of breast. HEART:  Regular rhythm.  No murmurs. LUNGS:  Clear to P and A. ABDOMEN:  No palpable organs or masses. NEUROLOGICAL:  Cranial nerves II through XII intact.  No focal or motor deficit.  No abnormal reflexes. SKIN:  Small lacerations over the left temporal area.  LABORATORY DATA:  CBC on admission, WBC 8000 with hemoglobin 10.4, hematocrit 35.7.  Chemistries; sodium 128, potassium 3.6, chloride 90, BUN 9, creatinine 0.70,  glucose 120, calcium 1.16.  Drug screen positive for opiates.  No other abnormalities on the screen.  Urinalysis essentially negative.  CBC on October 18, WBC 8.4, hemoglobin 12.6, hematocrit 36.6.  Subsequent sodium was 128.  RADIOLOGY:  CT of the head, subacute left subdural hematoma extending over the left anterolateral frontal lobes and over the left anterior temporal lobe, measured 8 mm in thickness and causes mild mass effect and a 3-mm midline shift to the right.  MRI showed extensive left subdural hematoma extending over the convexity, probable subarachnoid hemorrhage as well. A 2.5-mm left-to-right midline shift.  MRA, mild distal small vessel disease, most evident in right PICA.  Evidence of midline shift with medial displacement.  Left MCA and right displacement of the ACA branch vessels.  HOSPITAL COURSE:  Discussions were held by emergency room physician with Dr. Venetia Maxon, neurosurgeon in [place] concerning this patient.  He recommends conservative treatment at this point and observation. He also recommended Keppra 250 mg b.i.d. to prevent seizures.  She was continued on Protonix 40 mg daily, IV sodium chloride 0.9%.  Because of continued low serum sodium, she was bolused with 250 mg sodium chloride 0.9% today and repeat  may be made today.  The patient did not deteriorate neurologically in any way.  Her vital signs remained stable.  She remained alert and oriented.  Did complain of slight headache.  She was able to be ambulated and it was felt today she can be discharged to be followed as outpatient and be followed by Neurosurgery as well.  The patient will be given Keppra 250 mg to be continued b.i.d.     Lanyia Jewel G. Renard Matter, MD     AGM/MEDQ  D:  10/21/2012  T:  10/21/2012  Job:  [id number]

## 2012-10-21 NOTE — Progress Notes (Signed)
Patient being d/c home with prescriptions. Verbalizes understanding. IV cath removed and intact. No pain/swelling at site.

## 2012-10-22 NOTE — Procedures (Signed)
[facility] Dorotea Hand A. Gerilyn Pilgrim, MD     [email or web address]       NAME:  Beverly [name], Beverly [name]                 ACCOUNT NO.:  [id number]  MEDICAL RECORD NO.:  [id number]  LOCATION:  EE                           FACILITY:  [facility]  PHYSICIAN:  Khalif Stender A. Gerilyn Pilgrim, M.D. DATE OF BIRTH:  12-01-1932  DATE OF PROCEDURE:  10/19/2012 DATE OF DISCHARGE:  10/19/2012                             EEG INTERPRETATION   INDICATION:  This is a 79 year old female who presents with recurrent spell of falling and suspicious for possible seizures.  MEDICATIONS:  Protonix, Keppra, hydrocodone, alprazolam, acetaminophen.  ANALYSIS:  A 16-channel recording using standard 10/20 measurements is conducted for 23 minutes.  There is a posterior dominant rhythm of 9.5 hertz which attenuates with eye opening.  There is beta activity observed in the frontal areas.  Awake and mostly drowsy activities are recorded.  There is some rudimentary spindles that are seen from time to time.  Photic stimulation and hypoventilation were not carried out. There is no focal or lateralized slowing.  There is no epileptiform activity observed.  IMPRESSION:  Normal recording of awake and drowsy states.     Minah Axelrod A. Gerilyn Pilgrim, M.D.     KAD/MEDQ  D:  10/22/2012  T:  10/22/2012  Job:  [id number]

## 2012-10-25 ENCOUNTER — Other Ambulatory Visit: Payer: Self-pay | Admitting: Neurosurgery

## 2012-10-27 NOTE — Pre-Procedure Instructions (Signed)
Beverly [name]  10/27/2012   Your procedure is scheduled on:  October 28  Report to [facility] Entrance "A" [address] at 09:15 AM.  Call this number if you have problems the morning of surgery: [phone]   Remember:   Do not eat food or drink liquids after midnight.   Take these medicines the morning of surgery with A SIP OF WATER: Xanax (if needed), Flonase, Hydrocodone (if needed), Keppra, Metoprolol, Pantoprazole   Do not wear jewelry, make-up or nail polish.  Do not wear lotions, powders, or perfumes. You may wear deodorant.  Do not shave 48 hours prior to surgery. Men may shave face and neck.  Do not bring valuables to the hospital.  [facility] is not responsible                  for any belongings or valuables.               Contacts, dentures or bridgework may not be worn into surgery.  Leave suitcase in the car. After surgery it may be brought to your room.  For patients admitted to the hospital, discharge time is determined by your                treatment team.              Special Instructions: Shower using CHG 2 nights before surgery and the night before surgery.  If you shower the day of surgery use CHG.  Use special wash - you have one bottle of CHG for all showers.  You should use approximately 1/3 of the bottle for each shower.   Please read over the following fact sheets that you were given: Pain Booklet, Coughing and Deep Breathing, Blood Transfusion Information and Surgical Site Infection Prevention

## 2012-10-29 ENCOUNTER — Encounter (HOSPITAL_COMMUNITY): Payer: Self-pay | Admitting: Pharmacy Technician

## 2012-10-29 ENCOUNTER — Encounter (HOSPITAL_COMMUNITY)
Admission: RE | Admit: 2012-10-29 | Discharge: 2012-10-29 | Disposition: A | Discharge status: Home / Self Care | Payer: Medicare Other | Source: Ambulatory Visit | Attending: Neurosurgery | Admitting: Neurosurgery

## 2012-10-29 ENCOUNTER — Encounter (HOSPITAL_COMMUNITY): Payer: Self-pay

## 2012-10-29 DIAGNOSIS — Z01812 Encounter for preprocedural laboratory examination: Secondary | ICD-10-CM | POA: Insufficient documentation

## 2012-10-29 DIAGNOSIS — Z01818 Encounter for other preprocedural examination: Secondary | ICD-10-CM | POA: Insufficient documentation

## 2012-10-29 HISTORY — DX: Headache: R51

## 2012-10-29 HISTORY — DX: Unspecified convulsions: R56.9

## 2012-10-29 HISTORY — DX: Anxiety disorder, unspecified: F41.9

## 2012-10-29 HISTORY — DX: Essential (primary) hypertension: I10

## 2012-10-29 HISTORY — DX: Chronic obstructive pulmonary disease, unspecified: J44.9

## 2012-10-29 HISTORY — DX: Other amnesia: R41.3

## 2012-10-29 HISTORY — DX: Gastro-esophageal reflux disease without esophagitis: K21.9

## 2012-10-29 LAB — BASIC METABOLIC PANEL
CO2: 29 mEq/L (ref 19–32)
Chloride: 92 mEq/L — ABNORMAL LOW (ref 96–112)
Creatinine, Ser: 0.63 mg/dL (ref 0.50–1.10)
GFR calc Af Amer: >90 mL/min (ref >90)
Potassium: 3.3 mEq/L — ABNORMAL LOW (ref 3.5–5.1)
Sodium: 132 mEq/L — ABNORMAL LOW (ref 135–145)

## 2012-10-29 LAB — TYPE AND SCREEN
ABO/RH(D): O POS
Antibody Screen: NEGATIVE

## 2012-10-29 LAB — CBC
HCT: 37 % (ref 36.0–46.0)
Hemoglobin: 12.7 g/dL (ref 12.0–15.0)
MCHC: 34.3 g/dL (ref 30.0–36.0)
MCV: 87.9 fL (ref 78.0–100.0)
RBC: 4.21 MIL/uL (ref 3.87–5.11)
WBC: 9.8 10*3/uL (ref 4.0–10.5)

## 2012-10-29 LAB — ABO/RH: ABO/RH(D): O POS

## 2012-10-29 MED ORDER — VANCOMYCIN HCL 10 G IV SOLR
1000.0000 mg | INTRAVENOUS | Status: DC
  Filled 2012-10-29: qty 1000

## 2012-10-30 ENCOUNTER — Encounter (HOSPITAL_COMMUNITY): Payer: Medicare Other | Admitting: Critical Care Medicine

## 2012-10-30 ENCOUNTER — Inpatient Hospital Stay (HOSPITAL_COMMUNITY): Payer: Medicare Other | Admitting: Critical Care Medicine

## 2012-10-30 ENCOUNTER — Encounter (HOSPITAL_COMMUNITY): Payer: Self-pay | Admitting: *Deleted

## 2012-10-30 ENCOUNTER — Encounter (HOSPITAL_COMMUNITY)
Admission: RE | Disposition: A | Discharge status: Home / Self Care | Payer: Self-pay | Source: Ambulatory Visit | Attending: Neurosurgery

## 2012-10-30 ENCOUNTER — Inpatient Hospital Stay (HOSPITAL_COMMUNITY)
Admission: RE | Admit: 2012-10-30 | Discharge: 2012-11-04 | DRG: 026 | Disposition: A | Discharge status: Home / Self Care | Payer: Medicare Other | Source: Ambulatory Visit | Attending: Neurosurgery | Admitting: Neurosurgery

## 2012-10-30 DIAGNOSIS — G8190 Hemiplegia, unspecified affecting unspecified side: Secondary | ICD-10-CM | POA: Diagnosis present

## 2012-10-30 DIAGNOSIS — J449 Chronic obstructive pulmonary disease, unspecified: Secondary | ICD-10-CM | POA: Diagnosis present

## 2012-10-30 DIAGNOSIS — F411 Generalized anxiety disorder: Secondary | ICD-10-CM | POA: Diagnosis present

## 2012-10-30 DIAGNOSIS — I10 Essential (primary) hypertension: Secondary | ICD-10-CM | POA: Diagnosis present

## 2012-10-30 DIAGNOSIS — I6200 Nontraumatic subdural hemorrhage, unspecified: Principal | ICD-10-CM | POA: Diagnosis present

## 2012-10-30 DIAGNOSIS — Z96659 Presence of unspecified artificial knee joint: Secondary | ICD-10-CM

## 2012-10-30 DIAGNOSIS — Z79899 Other long term (current) drug therapy: Secondary | ICD-10-CM

## 2012-10-30 DIAGNOSIS — R569 Unspecified convulsions: Secondary | ICD-10-CM | POA: Diagnosis not present

## 2012-10-30 DIAGNOSIS — K219 Gastro-esophageal reflux disease without esophagitis: Secondary | ICD-10-CM | POA: Diagnosis present

## 2012-10-30 DIAGNOSIS — J4489 Other specified chronic obstructive pulmonary disease: Secondary | ICD-10-CM | POA: Diagnosis present

## 2012-10-30 HISTORY — DX: Atherosclerotic heart disease of native coronary artery without angina pectoris: I25.10

## 2012-10-30 HISTORY — PX: CRANIOTOMY: SHX93

## 2012-10-30 SURGERY — CRANIOTOMY HEMATOMA EVACUATION SUBDURAL
Anesthesia: General | Laterality: Left | Wound class: Clean

## 2012-10-30 MED ORDER — BUPIVACAINE HCL (PF) 0.25 % IJ SOLN
INTRAMUSCULAR | Status: DC | PRN
  Administered 2012-10-30: 4 mL

## 2012-10-30 MED ORDER — VANCOMYCIN HCL IN DEXTROSE 1-5 GM/200ML-% IV SOLN
1000.0000 mg | Freq: Once | INTRAVENOUS | Status: AC
  Administered 2012-10-31: 1000 mg via INTRAVENOUS
  Filled 2012-10-30: qty 200

## 2012-10-30 MED ORDER — LIDOCAINE-EPINEPHRINE 1 %-1:100000 IJ SOLN
INTRAMUSCULAR | Status: DC | PRN
  Administered 2012-10-30: 4 mL

## 2012-10-30 MED ORDER — VANCOMYCIN HCL IN DEXTROSE 1-5 GM/200ML-% IV SOLN
INTRAVENOUS | Status: AC
  Administered 2012-10-30: 1000 mg via INTRAVENOUS
  Filled 2012-10-30: qty 200

## 2012-10-30 MED ORDER — SENNA 8.6 MG PO TABS
1.0000 | ORAL_TABLET | Freq: Two times a day (BID) | ORAL | Status: DC
  Administered 2012-10-30 – 2012-11-02 (×7): 8.6 mg via ORAL
  Filled 2012-10-30 (×11): qty 1

## 2012-10-30 MED ORDER — ONDANSETRON HCL 4 MG/2ML IJ SOLN
INTRAMUSCULAR | Status: DC | PRN
  Administered 2012-10-30: 4 mg via INTRAVENOUS

## 2012-10-30 MED ORDER — NEOSTIGMINE METHYLSULFATE 1 MG/ML IJ SOLN
INTRAMUSCULAR | Status: DC | PRN
  Administered 2012-10-30: 3 mg via INTRAVENOUS

## 2012-10-30 MED ORDER — PANTOPRAZOLE SODIUM 40 MG IV SOLR
40.0000 mg | Freq: Every day | INTRAVENOUS | Status: DC
  Administered 2012-10-30: 40 mg via INTRAVENOUS
  Filled 2012-10-30: qty 40

## 2012-10-30 MED ORDER — ONDANSETRON HCL 4 MG PO TABS: 4.0000 mg | ORAL_TABLET | ORAL | Status: DC | PRN

## 2012-10-30 MED ORDER — PROPOFOL 10 MG/ML IV BOLUS
INTRAVENOUS | Status: DC | PRN
  Administered 2012-10-30: 120 mg via INTRAVENOUS

## 2012-10-30 MED ORDER — FENTANYL CITRATE 0.05 MG/ML IJ SOLN
INTRAMUSCULAR | Status: DC | PRN
  Administered 2012-10-30: 50 ug via INTRAVENOUS
  Administered 2012-10-30: 100 ug via INTRAVENOUS

## 2012-10-30 MED ORDER — GLYCOPYRROLATE 0.2 MG/ML IJ SOLN
INTRAMUSCULAR | Status: DC | PRN
  Administered 2012-10-30: 0.4 mg via INTRAVENOUS

## 2012-10-30 MED ORDER — POLYETHYLENE GLYCOL 3350 17 G PO PACK
17.0000 g | PACK | Freq: Every day | ORAL | Status: DC | PRN
  Filled 2012-10-30: qty 1

## 2012-10-30 MED ORDER — THROMBIN 20000 UNITS EX SOLR
CUTANEOUS | Status: DC | PRN
  Administered 2012-10-30: 14:00:00 via TOPICAL

## 2012-10-30 MED ORDER — METOPROLOL SUCCINATE ER 25 MG PO TB24
25.0000 mg | ORAL_TABLET | Freq: Every day | ORAL | Status: DC
  Administered 2012-10-31 – 2012-11-03 (×4): 25 mg via ORAL
  Filled 2012-10-30 (×5): qty 1

## 2012-10-30 MED ORDER — POTASSIUM CHLORIDE IN NACL 20-0.9 MEQ/L-% IV SOLN
INTRAVENOUS | Status: DC
  Administered 2012-10-30 – 2012-11-02 (×3): via INTRAVENOUS
  Filled 2012-10-30 (×10): qty 1000

## 2012-10-30 MED ORDER — ALPRAZOLAM 0.5 MG PO TABS
0.5000 mg | ORAL_TABLET | Freq: Three times a day (TID) | ORAL | Status: DC | PRN
  Administered 2012-10-30 – 2012-11-03 (×8): 0.5 mg via ORAL
  Filled 2012-10-30 (×8): qty 1

## 2012-10-30 MED ORDER — LABETALOL HCL 5 MG/ML IV SOLN
10.0000 mg | INTRAVENOUS | Status: DC | PRN
  Administered 2012-10-31 – 2012-11-01 (×2): 10 mg via INTRAVENOUS
  Administered 2012-11-01 – 2012-11-03 (×3): 20 mg via INTRAVENOUS
  Filled 2012-10-30 (×5): qty 4

## 2012-10-30 MED ORDER — ONDANSETRON HCL 4 MG/2ML IJ SOLN: 4.0000 mg | INTRAMUSCULAR | Status: DC | PRN

## 2012-10-30 MED ORDER — OXYCODONE-ACETAMINOPHEN 5-325 MG PO TABS
1.0000 | ORAL_TABLET | ORAL | Status: DC | PRN
  Administered 2012-10-30 – 2012-11-01 (×2): 1 via ORAL
  Filled 2012-10-30 (×2): qty 1

## 2012-10-30 MED ORDER — OXYCODONE HCL 5 MG/5ML PO SOLN: 5.0000 mg | Freq: Once | ORAL | Status: DC | PRN

## 2012-10-30 MED ORDER — DEXTROMETHORPHAN HBR 15 MG/5ML PO SYRP: 15.0000 mg | ORAL_SOLUTION | Freq: Four times a day (QID) | ORAL | Status: DC | PRN

## 2012-10-30 MED ORDER — HYDROCODONE-ACETAMINOPHEN 5-325 MG PO TABS: 1.0000 | ORAL_TABLET | ORAL | Status: DC | PRN

## 2012-10-30 MED ORDER — CELECOXIB 200 MG PO CAPS
200.0000 mg | ORAL_CAPSULE | Freq: Every day | ORAL | Status: DC
  Administered 2012-10-30 – 2012-11-03 (×5): 200 mg via ORAL
  Filled 2012-10-30 (×6): qty 1

## 2012-10-30 MED ORDER — BISACODYL 10 MG RE SUPP: 10.0000 mg | Freq: Every day | RECTAL | Status: DC | PRN

## 2012-10-30 MED ORDER — ROCURONIUM BROMIDE 100 MG/10ML IV SOLN
INTRAVENOUS | Status: DC | PRN
  Administered 2012-10-30: 30 mg via INTRAVENOUS

## 2012-10-30 MED ORDER — MIDAZOLAM HCL 2 MG/2ML IJ SOLN: 1.0000 mg | INTRAMUSCULAR | Status: DC | PRN

## 2012-10-30 MED ORDER — HYDROCODONE-ACETAMINOPHEN 7.5-325 MG PO TABS
1.0000 | ORAL_TABLET | ORAL | Status: DC | PRN
  Administered 2012-10-30 – 2012-11-03 (×11): 1 via ORAL
  Administered 2012-11-03: 2 via ORAL
  Filled 2012-10-30 (×10): qty 1
  Filled 2012-10-30: qty 2
  Filled 2012-10-30: qty 1

## 2012-10-30 MED ORDER — DEXAMETHASONE SODIUM PHOSPHATE 4 MG/ML IJ SOLN
INTRAMUSCULAR | Status: DC | PRN
  Administered 2012-10-30: 4 mg via INTRAVENOUS

## 2012-10-30 MED ORDER — DEXTROMETHORPHAN POLISTIREX 30 MG/5ML PO LQCR
15.0000 mg | Freq: Four times a day (QID) | ORAL | Status: DC | PRN
  Filled 2012-10-30: qty 5

## 2012-10-30 MED ORDER — ARTIFICIAL TEARS OP OINT
TOPICAL_OINTMENT | OPHTHALMIC | Status: DC | PRN
  Administered 2012-10-30: 1 via OPHTHALMIC

## 2012-10-30 MED ORDER — FERROUS SULFATE 325 (65 FE) MG PO TABS
325.0000 mg | ORAL_TABLET | Freq: Every day | ORAL | Status: DC
  Administered 2012-10-31 – 2012-11-04 (×5): 325 mg via ORAL
  Filled 2012-10-30 (×8): qty 1

## 2012-10-30 MED ORDER — INDAPAMIDE 2.5 MG PO TABS
2.5000 mg | ORAL_TABLET | Freq: Every day | ORAL | Status: DC
  Administered 2012-10-31 – 2012-11-03 (×4): 2.5 mg via ORAL
  Filled 2012-10-30 (×6): qty 1

## 2012-10-30 MED ORDER — FLEET ENEMA 7-19 GM/118ML RE ENEM
1.0000 | ENEMA | Freq: Once | RECTAL | Status: AC | PRN
  Filled 2012-10-30: qty 1

## 2012-10-30 MED ORDER — LEVETIRACETAM 250 MG PO TABS: 250.0000 mg | ORAL_TABLET | Freq: Two times a day (BID) | ORAL | Status: DC

## 2012-10-30 MED ORDER — FENTANYL CITRATE 0.05 MG/ML IJ SOLN
25.0000 ug | INTRAMUSCULAR | Status: DC | PRN
  Administered 2012-10-30 (×2): 50 ug via INTRAVENOUS

## 2012-10-30 MED ORDER — SODIUM CHLORIDE 0.9 % IV SOLN
500.0000 mg | Freq: Two times a day (BID) | INTRAVENOUS | Status: DC
  Administered 2012-10-30 – 2012-10-31 (×2): 500 mg via INTRAVENOUS
  Filled 2012-10-30 (×4): qty 5

## 2012-10-30 MED ORDER — CALCIUM CARBONATE-VITAMIN D 600-400 MG-UNIT PO TABS: 1.0000 | ORAL_TABLET | Freq: Two times a day (BID) | ORAL | Status: DC

## 2012-10-30 MED ORDER — FLUTICASONE PROPIONATE 50 MCG/ACT NA SUSP
1.0000 | Freq: Every day | NASAL | Status: DC
  Administered 2012-10-31 – 2012-11-03 (×4): 1 via NASAL
  Filled 2012-10-30 (×2): qty 16

## 2012-10-30 MED ORDER — PROMETHAZINE HCL 25 MG PO TABS
12.5000 mg | ORAL_TABLET | ORAL | Status: DC | PRN
Start: 2012-10-30 — End: 2012-11-04

## 2012-10-30 MED ORDER — SODIUM CHLORIDE 0.9 % IV SOLN
INTRAVENOUS | Status: DC | PRN
  Administered 2012-10-30: 13:00:00 via INTRAVENOUS

## 2012-10-30 MED ORDER — LACTATED RINGERS IV SOLN
INTRAVENOUS | Status: DC
  Administered 2012-10-30: 10:00:00 via INTRAVENOUS

## 2012-10-30 MED ORDER — FENTANYL CITRATE 0.05 MG/ML IJ SOLN
INTRAMUSCULAR | Status: AC
  Administered 2012-10-30: 16:00:00
  Filled 2012-10-30: qty 2

## 2012-10-30 MED ORDER — BACITRACIN ZINC 500 UNIT/GM EX OINT
TOPICAL_OINTMENT | CUTANEOUS | Status: DC | PRN
  Administered 2012-10-30: 1 via TOPICAL

## 2012-10-30 MED ORDER — METHOCARBAMOL 750 MG PO TABS
750.0000 mg | ORAL_TABLET | Freq: Two times a day (BID) | ORAL | Status: DC
  Administered 2012-10-30 – 2012-11-03 (×9): 750 mg via ORAL
  Filled 2012-10-30 (×14): qty 1

## 2012-10-30 MED ORDER — ACETAMINOPHEN 325 MG PO TABS: 650.0000 mg | ORAL_TABLET | ORAL | Status: DC | PRN

## 2012-10-30 MED ORDER — DOCUSATE SODIUM 100 MG PO CAPS
100.0000 mg | ORAL_CAPSULE | Freq: Two times a day (BID) | ORAL | Status: DC
  Administered 2012-10-30 – 2012-11-02 (×7): 100 mg via ORAL
  Filled 2012-10-30 (×9): qty 1

## 2012-10-30 MED ORDER — OXYCODONE HCL 5 MG PO TABS: 5.0000 mg | ORAL_TABLET | Freq: Once | ORAL | Status: DC | PRN

## 2012-10-30 MED ORDER — VITAMIN D3 25 MCG (1000 UNIT) PO TABS
1000.0000 [IU] | ORAL_TABLET | Freq: Every day | ORAL | Status: DC
  Administered 2012-10-30 – 2012-11-03 (×5): 1000 [IU] via ORAL
  Filled 2012-10-30 (×6): qty 1

## 2012-10-30 MED ORDER — POTASSIUM CHLORIDE ER 10 MEQ PO TBCR
20.0000 meq | EXTENDED_RELEASE_TABLET | Freq: Every day | ORAL | Status: DC
  Administered 2012-10-30 – 2012-11-03 (×5): 20 meq via ORAL
  Filled 2012-10-30 (×6): qty 2

## 2012-10-30 MED ORDER — 0.9 % SODIUM CHLORIDE (POUR BTL) OPTIME
TOPICAL | Status: DC | PRN
  Administered 2012-10-30: 1000 mL

## 2012-10-30 MED ORDER — PANTOPRAZOLE SODIUM 40 MG PO TBEC: 40.0000 mg | DELAYED_RELEASE_TABLET | Freq: Every day | ORAL | Status: DC

## 2012-10-30 MED ORDER — ACETAMINOPHEN 650 MG RE SUPP: 650.0000 mg | RECTAL | Status: DC | PRN

## 2012-10-30 MED ORDER — FENTANYL CITRATE 0.05 MG/ML IJ SOLN: 50.0000 ug | Freq: Once | INTRAMUSCULAR | Status: DC

## 2012-10-30 MED ORDER — CALCIUM CARBONATE-VITAMIN D 500-200 MG-UNIT PO TABS
1.0000 | ORAL_TABLET | Freq: Two times a day (BID) | ORAL | Status: DC
  Administered 2012-10-30 – 2012-11-03 (×9): 1 via ORAL
  Filled 2012-10-30 (×12): qty 1

## 2012-10-30 MED ORDER — LIDOCAINE HCL (CARDIAC) 20 MG/ML IV SOLN
INTRAVENOUS | Status: DC | PRN
  Administered 2012-10-30: 80 mg via INTRAVENOUS

## 2012-10-30 MED ORDER — HYDROCODONE-ACETAMINOPHEN 7.5-325 MG PO TABS: 1.0000 | ORAL_TABLET | Freq: Four times a day (QID) | ORAL | Status: DC | PRN

## 2012-10-30 SURGICAL SUPPLY — 75 items
APL SKNCLS STERI-STRIP NONHPOA (GAUZE/BANDAGES/DRESSINGS)
BANDAGE GAUZE 4  KLING STR (GAUZE/BANDAGES/DRESSINGS) IMPLANT
BANDAGE GAUZE ELAST BULKY 4 IN (GAUZE/BANDAGES/DRESSINGS) IMPLANT
BENZOIN TINCTURE PRP APPL 2/3 (GAUZE/BANDAGES/DRESSINGS) IMPLANT
BIT DRILL WIRE PASS 1.3MM (BIT) IMPLANT
BRUSH SCRUB EZ 1% IODOPHOR (MISCELLANEOUS) ×2 IMPLANT
BRUSH SCRUB EZ PLAIN DRY (MISCELLANEOUS) ×2 IMPLANT
BUR ACORN 6.0 PRECISION (BURR) ×2 IMPLANT
BUR ROUTER D-58 CRANI (BURR) IMPLANT
CANISTER SUCT 3000ML (MISCELLANEOUS) ×2 IMPLANT
CATH ROBINSON RED A/P 12FR (CATHETERS) IMPLANT
CLIP TI MEDIUM 6 (CLIP) IMPLANT
CONT SPEC 4OZ CLIKSEAL STRL BL (MISCELLANEOUS) ×2 IMPLANT
CORDS BIPOLAR (ELECTRODE) ×2 IMPLANT
DRAIN PENROSE 1/2X12 LTX STRL (WOUND CARE) IMPLANT
DRAIN SNY WOU 7FLT (WOUND CARE) IMPLANT
DRAPE NEUROLOGICAL W/INCISE (DRAPES) ×2 IMPLANT
DRAPE SURG IRRIG POUCH 19X23 (DRAPES) IMPLANT
DRAPE WARM FLUID 44X44 (DRAPE) ×2 IMPLANT
DRESSING TELFA 8X3 (GAUZE/BANDAGES/DRESSINGS) ×2 IMPLANT
DRILL WIRE PASS 1.3MM (BIT)
DRSG OPSITE 4X5.5 SM (GAUZE/BANDAGES/DRESSINGS) IMPLANT
DRSG OPSITE POSTOP 4X6 (GAUZE/BANDAGES/DRESSINGS) ×1 IMPLANT
DRSG PAD ABDOMINAL 8X10 ST (GAUZE/BANDAGES/DRESSINGS) IMPLANT
DURAPREP 6ML APPLICATOR 50/CS (WOUND CARE) ×2 IMPLANT
ELECT CAUTERY BLADE 6.4 (BLADE) ×2 IMPLANT
ELECT REM PT RETURN 9FT ADLT (ELECTROSURGICAL) ×2
ELECTRODE REM PT RTRN 9FT ADLT (ELECTROSURGICAL) ×1 IMPLANT
EVACUATOR SILICONE 100CC (DRAIN) IMPLANT
GAUZE SPONGE 4X4 16PLY XRAY LF (GAUZE/BANDAGES/DRESSINGS) IMPLANT
GLOVE BIO SURGEON STRL SZ8 (GLOVE) ×2 IMPLANT
GLOVE BIOGEL PI IND STRL 8 (GLOVE) ×1 IMPLANT
GLOVE BIOGEL PI IND STRL 8.5 (GLOVE) ×1 IMPLANT
GLOVE BIOGEL PI INDICATOR 8 (GLOVE) ×1
GLOVE BIOGEL PI INDICATOR 8.5 (GLOVE) ×1
GLOVE ECLIPSE 7.5 STRL STRAW (GLOVE) ×2 IMPLANT
GLOVE EXAM NITRILE LRG STRL (GLOVE) IMPLANT
GLOVE EXAM NITRILE MD LF STRL (GLOVE) IMPLANT
GLOVE EXAM NITRILE XL STR (GLOVE) IMPLANT
GLOVE EXAM NITRILE XS STR PU (GLOVE) IMPLANT
GOWN BRE IMP SLV AUR LG STRL (GOWN DISPOSABLE) IMPLANT
GOWN BRE IMP SLV AUR XL STRL (GOWN DISPOSABLE) IMPLANT
GOWN STRL REIN 2XL LVL4 (GOWN DISPOSABLE) IMPLANT
HEMOSTAT SURGICEL 2X14 (HEMOSTASIS) ×2 IMPLANT
KIT BASIN OR (CUSTOM PROCEDURE TRAY) ×2 IMPLANT
KIT ROOM TURNOVER OR (KITS) ×2 IMPLANT
NDL HYPO 25X1 1.5 SAFETY (NEEDLE) ×1 IMPLANT
NEEDLE HYPO 25X1 1.5 SAFETY (NEEDLE) ×2 IMPLANT
NS IRRIG 1000ML POUR BTL (IV SOLUTION) ×2 IMPLANT
PACK CRANIOTOMY (CUSTOM PROCEDURE TRAY) ×2 IMPLANT
PAD ARMBOARD 7.5X6 YLW CONV (MISCELLANEOUS) ×2 IMPLANT
PATTIES SURGICAL .5 X.5 (GAUZE/BANDAGES/DRESSINGS) IMPLANT
PATTIES SURGICAL .5 X3 (DISPOSABLE) IMPLANT
PATTIES SURGICAL 1X1 (DISPOSABLE) IMPLANT
PIN MAYFIELD SKULL DISP (PIN) IMPLANT
PLATE 1.5  2HOLE LNG NEURO (Plate) ×3 IMPLANT
PLATE 1.5 2HOLE LNG NEURO (Plate) IMPLANT
SCREW SELF DRILL HT 1.5/4MM (Screw) ×6 IMPLANT
SPECIMEN JAR SMALL (MISCELLANEOUS) IMPLANT
SPONGE GAUZE 4X4 12PLY (GAUZE/BANDAGES/DRESSINGS) IMPLANT
SPONGE NEURO XRAY DETECT 1X3 (DISPOSABLE) IMPLANT
SPONGE SURGIFOAM ABS GEL 12-7 (HEMOSTASIS) IMPLANT
STAPLER SKIN PROX WIDE 3.9 (STAPLE) ×2 IMPLANT
SUT ETHILON 3 0 PS 1 (SUTURE) IMPLANT
SUT NURALON 4 0 TR CR/8 (SUTURE) ×4 IMPLANT
SUT VIC AB 2-0 CP2 18 (SUTURE) ×4 IMPLANT
SUT VIC AB 3-0 SH 8-18 (SUTURE) IMPLANT
SYR 20ML ECCENTRIC (SYRINGE) ×2 IMPLANT
SYR CONTROL 10ML LL (SYRINGE) ×2 IMPLANT
TOWEL OR 17X24 6PK STRL BLUE (TOWEL DISPOSABLE) ×2 IMPLANT
TOWEL OR 17X26 10 PK STRL BLUE (TOWEL DISPOSABLE) ×2 IMPLANT
TRAP SPECIMEN MUCOUS 40CC (MISCELLANEOUS) IMPLANT
TRAY FOLEY CATH 14FRSI W/METER (CATHETERS) IMPLANT
UNDERPAD 30X30 INCONTINENT (UNDERPADS AND DIAPERS) IMPLANT
WATER STERILE IRR 1000ML POUR (IV SOLUTION) ×2 IMPLANT

## 2012-10-30 NOTE — Op Note (Signed)
10/30/2012  2:54 PM  PATIENT:  Beverly [name]  79 y.o. female  PRE-OPERATIVE DIAGNOSIS:  Left Subdural hematoma with aphasia and hemiparesis  POST-OPERATIVE DIAGNOSIS:  Left Subdural hematoma with aphasia and hemiparesis   PROCEDURE:  Procedure(s) with comments: CRANIOTOMY HEMATOMA EVACUATION SUBDURAL (Left) - Left Craniotomy for evacuation of subdural hematoma  SURGEON:  Surgeon(s) and Role:    * Bellarose Burtt, MD - Primary  PHYSICIAN ASSISTANT:   ASSISTANTS: Poteat, RN   ANESTHESIA:   general  EBL:  Total I/O In: 500 [I.V.:500] Out: 142 [Other:42; Blood:100]  BLOOD ADMINISTERED:none  DRAINS: (10) Jackson-Pratt drain(s) with closed bulb suction in the subdural space   LOCAL MEDICATIONS USED:  LIDOCAINE   SPECIMEN:  No Specimen  DISPOSITION OF SPECIMEN:  N/A  COUNTS:  YES  TOURNIQUET:  * No tourniquets in log *  DICTATION: DICTATION: Patient is 79 year old woman who has developed pan-hemispheric left subdural hematoma.  She has developed worsening headache and episodes of speech arrest. MRI shows left subdural hematoma with mass effect.  It was elected to take patient to surgery for  Left craniotomy for SDH.  Procedure:  Following smooth intubation, patient was placed in right lateral position.  Head was placed on donut head holder and left frontal scalp was shaved and prepped and draped in usual sterile fashion with Duraprep.  Area of planned incision was infiltrated with lidocaine. A ilinear incision was made and carried through temporalis fascia and muscle to expose calvarium on the left side of her head.  Skull flap was elevated exposing subdural hematoma.  Dura was opened and subdural was evacuated.  There were fairly thick membranes on the left as well as liquid subdural under pressure. The subdural cavity was irrigated with saline until significantly clearer.  Subdural membranes were opened and removed where possible. Hemostasis was assured.  The brain was considerably  more relaxed after hematoma evacuation.  A #10 JP drains was placed and anchored with nylon suture. Bone flap was replaced with plates, the fascia and galea were closed with 2-0 vicryl sutures and the skin was re approximated with staples.  Sterile occlusive dressing was placed.  Patient was returned to a supine position and transferred to the ICU in stable and satisfactory condition. Counts were correct at the end of the case.  PLAN OF CARE: Admit to inpatient   PATIENT DISPOSITION:  PACU - hemodynamically stable.   Delay start of Pharmacological VTE agent (>24hrs) due to surgical blood loss or risk of bleeding: yes  

## 2012-10-30 NOTE — Progress Notes (Signed)
10/30/2012   1915  Pt c/o continuous head ache. Pt screams and is very uncomfortable when ever JP drain is to bulb suction. MD made aware and is to be at bedside. Pt remains neuro intact. Daughter at bedside. Both updated. Will continue to monitor.    Carlyon Prows RN

## 2012-10-30 NOTE — Progress Notes (Signed)
I am able to get seal on drain, but patient complains of head pain when drain put to any suction.  Will leave to gravity tonight and D/C in am.  Patient otherwise doing well.

## 2012-10-30 NOTE — H&P (Signed)
> [address] [place], [place] [id number] Phone: [phone]   Patient ID:   [id number] Patient: Beverly [name]  Date of Birth: October 03, 1932 Visit Type: Office Visit   Date: 10/24/2012 03:12 PM Provider: Danae Orleans. Venetia Maxon   Historian: self  This 79 year old female presents for subdural hematoma.  HISTORY OF PRESENT ILLNESS: 1.  subdural hematoma   Patient states occasional headaches.  She also states 3 episodes of having noncoherent speech but was conscious.  The patient is quite concerned about her repeated episodes of speech arrest.  She is also complaining of headache.  [facility] on Keppra. TIA workup at the hospital was negative.  I was planning on seeing the patient back in the office to assess interval improvement but I was called today by her primary physician, Dr. Renard Matter because of more episodes of speech arrest.  I therefore the patient comes to the office today for an evaluation.     PAST MEDICAL/SURGICAL HISTORY  (Detailed)  Disease/disorder Onset Date Management Date Comments    Hysterectomy, total      Knee replacement    COPD          PAST MEDICAL HISTORY, SURGICAL HISTORY, FAMILY HISTORY, SOCIAL HISTORY AND REVIEW OF SYSTEMS I have reviewed the patient's past medical, surgical, family and social history as well as the comprehensive review of systems as included on the [facility] history form dated 10/24/2012, which I have signed.  Family History  (Detailed) Patient reports there is no relevant family history.   SOCIAL HISTORY  (Detailed) Tobacco use reviewed. Preferred language is [place].   Smoking status: Never smoker.  SMOKING STATUS Use Status Type Smoking Status Usage Per Day Years Used Total Pack Years  no/never  Never smoker             Medications (added, continued or stopped this visit):   Medication Dose Prescribed Else Ind  Started Stopped  alprazolam 0.5 mg tablet 0.5 mg Y    Flonase 50 mcg/actuation nasal spray,suspension 50 mcg Y    hydrocodone 7.5 mg-acetaminophen 325 mg tablet 7.5 mg-325 mg Y    indapamide 2.5 mg tablet 2.5 mg Y    levetiracetam 500 mg tablet 500 mg Y    methocarbamol 750 mg tablet 750 mg Y    metoprolol succinate UNKNOWN Y    pantoprazole 40 mg tablet,delayed release 40 mg Y    potassium chloride UNKNOWN Y       Allergies:  Ingredient Reaction Medication Name Comment  PENICILLINS     New allergies added during this encounter. Active list above.  REVIEW OF SYSTEMS: System Neg/Pos Details  Constitutional Negative Chills, fatigue, fever, malaise, night sweats, weight gain and weight loss.  ENMT Negative Ear drainage, nasal drainage, otalgia, sinus pressure and sore throat.  ENMT Positive Hearing loss.  Eyes Negative Eye discharge and eye pain.  Eyes Positive Vision changes.  Respiratory Negative Chronic cough, cough, dyspnea, known TB exposure and wheezing.  Cardio Negative Chest pain, claudication, edema and irregular heartbeat/palpitations.  Cardio Positive Irregular pulse.  GI Negative Abdominal pain, blood in stool, change in stool pattern, constipation, decreased appetite, diarrhea, heartburn, nausea and vomiting.  GU Positive Uterine/cervical carcinoma.  GU Negative Dysuria, hematuria, hot flashes, irregular menses, polyuria, urinary frequency, urinary incontinence and urinary retention.  Endocrine Negative Cold intolerance, heat intolerance, polydipsia and polyphagia.  Neuro Negative Dizziness, extremity weakness, gait disturbance, headache,  memory impairment, numbness in extremities, seizures and tremors.  Psych Negative Anxiety, depression and insomnia.  Integumentary Negative Brittle hair, brittle nails, change in shape/size of mole(s), hair loss, hirsutism, hives, pruritus, rash and skin lesion.  MS Positive Back pain, Neck pain, Arthritic manifestations.  MS Negative  Joint pain, joint swelling and muscle weakness.  Hema/Lymph Negative Easy bleeding, easy bruising and lymphadenopathy.  Allergic/Immuno Positive Environmental allergies, Food allergies.  Allergic/Immuno Negative Contact allergy and seasonal allergies.  Reproductive Negative Breast discharge, breast lump(s), dysmenorrhea, dyspareunia, history of abnormal PAP smear and vaginal discharge.    Vitals Date Temp F BP Pulse Ht In Wt Lb BMI BSA Pain Score  10/24/2012  166/92 90 62 140 25.61  0/10     PHYSICAL EXAM General Level of Distress: no acute distress Overall Appearance: normal  Head and Face  Right Left  Fundoscopic Exam:  normal normal    Cardiovascular Cardiac: regular rate and rhythm without murmur  Right Left  Carotid Pulses: normal normal  Respiratory Lungs: clear to auscultation  Neurological Orientation: normal Recent and Remote Memory: normal Attention Span and Concentration:   normal Language: normal Fund of Knowledge: normal  Right Left Sensation: normal normal Upper Extremity Coordination: normal normal  Lower Extremity Coordination: normal normal  Musculoskeletal Gait and Station: normal  Right Left Upper Extremity Muscle Strength: normal normal Lower Extremity Muscle Strength: normal normal Upper Extremity Muscle Tone:  normal normal Lower Extremity Muscle Tone: normal normal  Motor Strength Upper and lower extremity motor strength was tested in the clinically pertinent muscles .Any abnormal findings will be noted below..   Right Left Grip: 4/5  Finger Extensor: 4/5   Deep Tendon Reflexes  Right Left Biceps: normal normal Triceps: normal normal Brachiloradialis: normal normal Patellar: normal normal Achilles: normal normal  Cranial Nerves II. Optic Nerve/Visual Fields: normal III. Oculomotor: normal IV. Trochlear: normal V. Trigeminal: normal VI. Abducens: normal VII. Facial: normal VIII. Acoustic/Vestibular: normal IX.  Glossopharyngeal: normal X. Vagus: normal XI. Spinal Accessory: normal XII. Hypoglossal: normal  Motor and other Tests Lhermittes: negative Rhomberg: negative Pronator drift: right     Right Left Hoffman's: normal normal Clonus: normal normal Babinski: normal normal      DIAGNOSTIC RESULTS Diagnostic report text  CLINICAL DATA: Difficulty speaking after a fall earlier this month. Abnormal CT of the head.  EXAM: MRI HEAD WITHOUT CONTRAST  TECHNIQUE: Multiplanar, multisequence MR imaging was performed. No intravenous contrast was administered.  COMPARISON: CT head without contrast 10/18/2012.  FINDINGS: The left extra-axial hemorrhage is re- demonstrated. This extends over the entire convexity of the left hemisphere. It measures 7 mm maximally on the coronal images. While this is predominately subdural, there is some suggestion of subarachnoid extension the over the convexity as well. No central subarachnoid hemorrhage is evident. Midline shift measures 3 mm throat that midline shift measures 2.5 mm at the foramen of Monro. There is some sulcal effacement over the convexity.  Scattered white matter changes are within normal limits for age. Flow is present in the major intracranial arteries. The globes and orbits are intact. The paranasal sinuses and mastoid air cells are clear.  IMPRESSION: 1. Extensive left subdural hematoma extending over the convexity persist. 2. Probable subarachnoid hemorrhage as well. 3. 2.5 mm left to right midline shift at the foramen of Monro.   Electronically Signed By: Gennette Pac M.D. On: 10/19/2012 08:48    IMPRESSION Patient is symptomatic from a left subdural hematoma.  She has right hand weakness and mild pronator  drift.  She is having intermittent headaches and is also having difficulty with spontaneous speech.  She was not felt to have a TIA and is not known to have had a seizure.  In light of these findings as well as  a large left hemispheric subdural hematoma, I have recommended drainage of this subdural.  Assessment/Plan # Detail Type Description   1. Assessment Subdural hematoma (432.1).       2. Assessment Aphasia (784.3).       3. Assessment Hemiparesis (342.90).        Pain Assessment/Treatment Pain Scale: 0/10. Method: Numeric Pain Intensity Scale. Pain Assessment/Treatment follow-up plan of care: Patient states only occasional headaches..  Patient agrees and wishes to proceed and surgery is scheduled for 10/30/2012.  Risks and benefits were discussed with the patient and she wishes to proceed.  Orders: Diagnostic Procedures: Assessment Procedure  432.1 Craniotomy - left             Provider:  Danae Orleans. Venetia Maxon  10/25/2012 05:40 PM Dictation edited by: Danae Orleans. Venetia Maxon   ----------------------------------------------------------------------------------------------------------------------------------------------------------------------         Electronically signed by Danae Orleans Venetia Maxon on 10/25/2012 05:40 PM

## 2012-10-30 NOTE — Interval H&P Note (Signed)
History and Physical Interval Note:  10/30/2012 8:05 AM  Beverly [name]  has presented today for surgery, with the diagnosis of Subdural hematoma  The various methods of treatment have been discussed with the patient and family. After consideration of risks, benefits and other options for treatment, the patient has consented to  Procedure(s) with comments: CRANIOTOMY HEMATOMA EVACUATION SUBDURAL (Left) - Left Craniotomy for evacuation of subdural hematoma as a surgical intervention .  The patient's history has been reviewed, patient examined, no change in status, stable for surgery.  I have reviewed the patient's chart and labs.  Questions were answered to the patient's satisfaction.     Per Beagley D

## 2012-10-30 NOTE — Anesthesia Preprocedure Evaluation (Addendum)
Anesthesia Evaluation  Patient identified by MRN, date of birth, ID band Patient awake    Reviewed: Allergy & Precautions, H&P , NPO status , Patient's Chart, lab work & pertinent test results, reviewed documented beta blocker date and time   Airway Mallampati: I TM Distance: >3 FB Neck ROM: Full    Dental  (+) Dental Advisory Given   Pulmonary COPD breath sounds clear to auscultation        Cardiovascular hypertension, Pt. on home beta blockers Rhythm:Regular Rate:Normal     Neuro/Psych  Headaches, Seizures -,  PSYCHIATRIC DISORDERS Anxiety TIA   GI/Hepatic GERD-  ,  Endo/Other    Renal/GU      Musculoskeletal   Abdominal   Peds  Hematology   Anesthesia Other Findings   Reproductive/Obstetrics                          Anesthesia Physical Anesthesia Plan  ASA: III  Anesthesia Plan: General   Post-op Pain Management:    Induction: Intravenous  Airway Management Planned: Oral ETT  Additional Equipment: Arterial line  Intra-op Plan:   Post-operative Plan: Extubation in OR  Informed Consent: I have reviewed the patients History and Physical, chart, labs and discussed the procedure including the risks, benefits and alternatives for the proposed anesthesia with the patient or authorized representative who has indicated his/her understanding and acceptance.   Dental advisory given  Plan Discussed with: Surgeon and CRNA  Anesthesia Plan Comments:        Anesthesia Quick Evaluation

## 2012-10-30 NOTE — Transfer of Care (Signed)
Immediate Anesthesia Transfer of Care Note  Patient: Beverly [name]  Procedure(s) Performed: Procedure(s) with comments: CRANIOTOMY HEMATOMA EVACUATION SUBDURAL (Left) - Left Craniotomy for evacuation of subdural hematoma  Patient Location: PACU  Anesthesia Type:General  Level of Consciousness: awake, alert  and oriented  Airway & Oxygen Therapy: Patient Spontanous Breathing and Patient connected to nasal cannula oxygen  Post-op Assessment: Report given to PACU RN, Post -op Vital signs reviewed and stable and Patient moving all extremities X 4  Post vital signs: Reviewed and stable  Complications: No apparent anesthesia complications

## 2012-10-30 NOTE — Anesthesia Postprocedure Evaluation (Signed)
  Anesthesia Post-op Note  Patient: Beverly [name]  Procedure(s) Performed: Procedure(s) with comments: CRANIOTOMY HEMATOMA EVACUATION SUBDURAL (Left) - Left Craniotomy for evacuation of subdural hematoma  Patient Location: PACU  Anesthesia Type:General  Level of Consciousness: awake  Airway and Oxygen Therapy: Patient Spontanous Breathing  Post-op Pain: mild  Post-op Assessment: Post-op Vital signs reviewed, Patient's Cardiovascular Status Stable, Respiratory Function Stable, Patent Airway, No signs of Nausea or vomiting and Pain level controlled  Post-op Vital Signs: Reviewed and stable  Complications: No apparent anesthesia complications

## 2012-10-30 NOTE — Preoperative (Signed)
Beta Blockers   Reason not to administer Beta Blockers:Not Applicable, pt took metoprolol 10/30/12

## 2012-10-30 NOTE — Progress Notes (Signed)
Awake, alert, conversant.  Full strength both upper extremities.  No drift.  Speech clear, fluent, spontaneous.  Doing well.

## 2012-10-30 NOTE — Brief Op Note (Signed)
10/30/2012  2:54 PM  PATIENT:  Beverly [name]  79 y.o. female  PRE-OPERATIVE DIAGNOSIS:  Left Subdural hematoma with aphasia and hemiparesis  POST-OPERATIVE DIAGNOSIS:  Left Subdural hematoma with aphasia and hemiparesis   PROCEDURE:  Procedure(s) with comments: CRANIOTOMY HEMATOMA EVACUATION SUBDURAL (Left) - Left Craniotomy for evacuation of subdural hematoma  SURGEON:  Surgeon(s) and Role:    * Maeola Harman, MD - Primary  PHYSICIAN ASSISTANT:   ASSISTANTS: Poteat, RN   ANESTHESIA:   general  EBL:  Total I/O In: 500 [I.V.:500] Out: 142 [Other:42; Blood:100]  BLOOD ADMINISTERED:none  DRAINS: (10) Jackson-Pratt drain(s) with closed bulb suction in the subdural space   LOCAL MEDICATIONS USED:  LIDOCAINE   SPECIMEN:  No Specimen  DISPOSITION OF SPECIMEN:  N/A  COUNTS:  YES  TOURNIQUET:  * No tourniquets in log *  DICTATION: DICTATION: Patient is 79 year old woman who has developed pan-hemispheric left subdural hematoma.  She has developed worsening headache and episodes of speech arrest. MRI shows left subdural hematoma with mass effect.  It was elected to take patient to surgery for  Left craniotomy for SDH.  Procedure:  Following smooth intubation, patient was placed in right lateral position.  Head was placed on donut head holder and left frontal scalp was shaved and prepped and draped in usual sterile fashion with Duraprep.  Area of planned incision was infiltrated with lidocaine. A ilinear incision was made and carried through temporalis fascia and muscle to expose calvarium on the left side of her head.  Skull flap was elevated exposing subdural hematoma.  Dura was opened and subdural was evacuated.  There were fairly thick membranes on the left as well as liquid subdural under pressure. The subdural cavity was irrigated with saline until significantly clearer.  Subdural membranes were opened and removed where possible. Hemostasis was assured.  The brain was considerably  more relaxed after hematoma evacuation.  A #10 JP drains was placed and anchored with nylon suture. Bone flap was replaced with plates, the fascia and galea were closed with 2-0 vicryl sutures and the skin was re approximated with staples.  Sterile occlusive dressing was placed.  Patient was returned to a supine position and transferred to the ICU in stable and satisfactory condition. Counts were correct at the end of the case.  PLAN OF CARE: Admit to inpatient   PATIENT DISPOSITION:  PACU - hemodynamically stable.   Delay start of Pharmacological VTE agent (>24hrs) due to surgical blood loss or risk of bleeding: yes

## 2012-10-30 NOTE — Progress Notes (Signed)
ANTIBIOTIC CONSULT NOTE - INITIAL  Pharmacy Consult for Vancomycin Indication: post-op prophylaxis  Allergies  Allergen Reactions  . Penicillins Itching    Patient Measurements: Height: 5\' 2"  (157.5 cm) Weight: 149 lb 14.6 oz (68 kg) IBW/kg (Calculated) : 50.1  Vital Signs: Temp: 98 F (36.7 C) (10/28 1430) Temp src: Axillary (10/28 1430) BP: 131/59 mmHg (10/28 1500) Pulse Rate: 89 (10/28 1500) Intake/Output from previous day:   Intake/Output from this shift: Total I/O In: 500 [I.V.:500] Out: 184 [Drains:42; Other:42; Blood:100]  Labs:  Recent Labs  10/29/12 1345  WBC 9.8  HGB 12.7  PLT 365  CREATININE 0.63   Estimated Creatinine Clearance: 53.3 ml/min (by C-G formula based on Cr of 0.63).    Microbiology: No results found for this or any previous visit (from the past 720 hour(s)).  Medical History: Past Medical History  Diagnosis Date  . Arthritis   . Hearing loss, central     Left ear  . Hypertension   . Anxiety   . COPD (chronic obstructive pulmonary disease)   . Seizures     no seizures, speech issues  . GERD (gastroesophageal reflux disease)   . Headache(784.0)   . Memory changes   . Coronary artery disease     Medications:  Prescriptions prior to admission  Medication Sig Dispense Refill  . ALPRAZolam (XANAX) 0.5 MG tablet Take 0.5 mg by mouth 3 (three) times daily as needed for anxiety.      Marland Kitchen aspirin 81 MG tablet Take 81 mg by mouth daily.      . Calcium Carbonate-Vitamin D 600-400 MG-UNIT per tablet Take 1 tablet by mouth 2 (two) times daily.      . celecoxib (CELEBREX) 200 MG capsule Take 200 mg by mouth daily.      . cholecalciferol (VITAMIN D) 1000 UNITS tablet Take 1,000 Units by mouth daily.      Marland Kitchen Dextromethorphan HBr (TUSSIN COUGH PO) Take 20 mLs by mouth daily as needed (cough).      . ferrous sulfate 325 (65 FE) MG tablet Take 325 mg by mouth daily with breakfast.      . fluticasone (FLONASE) 50 MCG/ACT nasal spray Place 1 spray  into both nostrils daily.       Marland Kitchen HYDROcodone-acetaminophen (NORCO) 7.5-325 MG per tablet Take 1 tablet by mouth every 6 (six) hours as needed for pain.       . indapamide (LOZOL) 2.5 MG tablet Take 2.5 mg by mouth daily.      Marland Kitchen levETIRAcetam (KEPPRA) 500 MG tablet Take 250 mg by mouth every 12 (twelve) hours.      . methocarbamol (ROBAXIN) 750 MG tablet Take 750 mg by mouth 2 (two) times daily.       . metoprolol succinate (TOPROL-XL) 50 MG 24 hr tablet Take 25 mg by mouth daily. Take with or immediately following a meal.      . Multiple Vitamins-Minerals (PRESERVISION/LUTEIN PO) Take 1 tablet by mouth 2 (two) times daily.      . pantoprazole (PROTONIX) 40 MG tablet Take 40 mg by mouth daily.      . potassium chloride (K-DUR) 10 MEQ tablet Take 20 mEq by mouth daily.      Marland Kitchen Propylene Glycol (SYSTANE BALANCE OP) Place 1-2 drops into both eyes daily as needed (dry eyes).       Assessment: 79 yo F s/p subdural hematoma evacuation.  Pt received Vancomycin 1gm IV pre-op at 1300 today.  SCr is normal with estimated  CrCl ~ 50.  Goal of Therapy:  Vancomycin trough level 10-15 mcg/ml  Plan:  Repeat Vancomycin 1gm IV x 1 dose - next dose due tomorrow AM.  Dixie Dials, Pharm.D., BCPS Clinical Pharmacist Pager [phone] 10/30/2012 3:35 PM

## 2012-10-30 NOTE — Progress Notes (Signed)
UR COMPLETED  

## 2012-10-30 NOTE — Anesthesia Procedure Notes (Signed)
Procedure Name: Intubation Date/Time: 10/30/2012 12:55 PM Performed by: Elon Alas Pre-anesthesia Checklist: Patient identified, Emergency Drugs available, Suction available, Patient being monitored and Timeout performed Patient Re-evaluated:Patient Re-evaluated prior to inductionOxygen Delivery Method: Circle system utilized Preoxygenation: Pre-oxygenation with 100% oxygen Intubation Type: IV induction Ventilation: Mask ventilation without difficulty Laryngoscope Size: Mac and 3 Grade View: Grade II Tube type: Oral Tube size: 7.5 mm Number of attempts: 1 Airway Equipment and Method: Stylet Placement Confirmation: positive ETCO2,  ETT inserted through vocal cords under direct vision and breath sounds checked- equal and bilateral Secured at: 22 cm Tube secured with: Tape Dental Injury: Teeth and Oropharynx as per pre-operative assessment

## 2012-10-31 ENCOUNTER — Encounter (HOSPITAL_COMMUNITY): Payer: Self-pay | Admitting: Neurosurgery

## 2012-10-31 MED ORDER — LEVETIRACETAM 500 MG PO TABS
500.0000 mg | ORAL_TABLET | Freq: Two times a day (BID) | ORAL | Status: DC
  Administered 2012-10-31 – 2012-11-04 (×8): 500 mg via ORAL
  Filled 2012-10-31 (×10): qty 1

## 2012-10-31 MED ORDER — PANTOPRAZOLE SODIUM 40 MG PO TBEC
40.0000 mg | DELAYED_RELEASE_TABLET | Freq: Every day | ORAL | Status: DC
  Administered 2012-10-31 – 2012-11-03 (×4): 40 mg via ORAL
  Filled 2012-10-31 (×4): qty 1

## 2012-10-31 NOTE — Progress Notes (Signed)
Subjective: Patient reports "I feel better this morning"  Objective: Vital signs in last 24 hours: Temp:  [97 F (36.1 C)-99.2 F (37.3 C)] 97.8 F (36.6 C) (10/29 0800) Pulse Rate:  [84-107] 96 (10/29 0900) Resp:  [7-29] 14 (10/29 0900) BP: (104-183)/(44-95) 133/57 mmHg (10/29 0900) SpO2:  [93 %-100 %] 100 % (10/29 0900) Weight:  [68 kg (149 lb 14.6 oz)] 68 kg (149 lb 14.6 oz) (10/28 1500)  Intake/Output from previous day: 10/28 0701 - 10/29 0700 In: 1805 [I.V.:1700; IV Piggyback:105] Out: 2464 [Urine:2150; Drains:172; Blood:100] Intake/Output this shift:    Alert, oriented. PEARL. No drift. MAEW. Drsg intact. JP with minimal thin bloody drainage. ~124ml o/p overnight.  Lab Results:  Recent Labs  10/29/12 1345  WBC 9.8  HGB 12.7  HCT 37.0  PLT 365   BMET  Recent Labs  10/29/12 1345  NA 132*  K 3.3*  CL 92*  CO2 29  GLUCOSE 118*  BUN 11  CREATININE 0.63  CALCIUM 9.9    Studies/Results: Dg Chest 2 View  10/29/2012   CLINICAL DATA:  History of craniotomy for evacuation of subdural hematoma.  EXAM: CHEST  2 VIEW  COMPARISON:  CHEST x-ray 10/03/2012.  FINDINGS: Lung volumes are normal. No consolidative airspace disease. No pleural effusions. No pneumothorax. No pulmonary nodule or mass noted. Pulmonary vasculature and heart size are within normal limits. Mild bilateral apical pleuroparenchymal thickening, similar to prior studies, most compatible with chronic scarring. Moderate size hiatal hernia. Atherosclerosis in the thoracic aorta.  IMPRESSION: 1.  No radiographic evidence of acute cardiopulmonary disease. 2. Atherosclerosis. 3. Moderate-sized height hernia.   Electronically Signed   By: Trudie Reed M.D.   On: 10/29/2012 14:52    Assessment/Plan: Stable   LOS: 1 day  Will d/w DrStern plan for d/c of drain.    Georgiann Cocker 10/31/2012, 9:11 AM

## 2012-10-31 NOTE — Progress Notes (Signed)
Much better this morning.  Able to tolerate drain charge and no longer complaining of pain.  Will leave drain in place today.

## 2012-11-01 MED ORDER — GUAIFENESIN 100 MG/5ML PO SYRP
200.0000 mg | ORAL_SOLUTION | ORAL | Status: DC | PRN
  Administered 2012-11-01: 200 mg via ORAL
  Filled 2012-11-01: qty 10

## 2012-11-01 NOTE — Progress Notes (Signed)
Subjective: Patient reports "Just a little"(headache)  Objective: Vital signs in last 24 hours: Temp:  [97.3 F (36.3 C)-98.3 F (36.8 C)] 98.1 F (36.7 C) (10/30 0400) Pulse Rate:  [72-96] 77 (10/30 0700) Resp:  [5-25] 15 (10/30 0700) BP: (105-168)/(53-95) 162/71 mmHg (10/30 0700) SpO2:  [93 %-100 %] 97 % (10/30 0700)  Intake/Output from previous day: 10/29 0701 - 10/30 0700 In: 1920 [P.O.:420; I.V.:1500] Out: 3285 [Urine:3105; Drains:180] Intake/Output this shift:    Alert, in good spirits. JP o/p overnight, patent with thin blood tinged drainage. PEARL. MAEW.  Lab Results:  Recent Labs  10/29/12 1345  WBC 9.8  HGB 12.7  HCT 37.0  PLT 365   BMET  Recent Labs  10/29/12 1345  NA 132*  K 3.3*  CL 92*  CO2 29  GLUCOSE 118*  BUN 11  CREATININE 0.63  CALCIUM 9.9    Studies/Results: No results found.  Assessment/Plan: Improving   LOS: 2 days  Per Dr. Venetia Maxon, will leave JP in place & reassess this afternoon. May be OOB to chair/bathroom with assist.     Georgiann Cocker 11/01/2012, 7:51 AM

## 2012-11-01 NOTE — Progress Notes (Signed)
Continue JP drain until tomorrow.

## 2012-11-02 NOTE — Progress Notes (Signed)
Will D/C drain later today

## 2012-11-02 NOTE — Progress Notes (Signed)
JP drain output 130cc overnight on 10/30 7p-7a

## 2012-11-02 NOTE — Progress Notes (Addendum)
Approximately 12:49 pt began having difficulty speaking, unable to get some words out, saying the word "bed" several times in reference to her lunch tray. Pt alert, FC, obviously coherent and aware of what was going on. Dr. Venetia Maxon arrived at bedside. Pt saying few words, unable to make sentences. Pt tearful. Will  f/u with Dr. Venetia Maxon if no improvement in 15 minutes per MD. Speech began improving at approximately 12:55 but not to full return. Will continue to monitor.   Lennie Muckle Leigh  13:04 pt reports speech feels back to normal.  Will continue to monitor.

## 2012-11-02 NOTE — Progress Notes (Signed)
UR completed.  Justun Anaya, RN BSN [facility] CCM Trauma/Neuro ICU Case Manager [phone]  

## 2012-11-02 NOTE — Progress Notes (Signed)
Patient ID: Beverly [name], female   DOB: 1932-11-11, 79 y.o.   MRN: [id number] Pt now conversant, appropriate affect & fluent speech. Rapidly responds to questions and able to name caregivers in her room without hesitation. PEARL. MAEW. No drift.  Drsg dry, intact.  Reassured pt that irritation from pulling drain may have caused small seizure resulting in dysphasia - much like stimulation by subdural pre-op caused similar symptoms. Recurrence is not expected.  Dr. Venetia Maxon will visit as well upon completion of his case in OR.  Georgiann Cocker, RN, BSN

## 2012-11-02 NOTE — Progress Notes (Signed)
Subjective: Patient reports "I think I'm fine"  Objective: Vital signs in last 24 hours: Temp:  [97.4 F (36.3 C)-98.2 F (36.8 C)] 98.2 F (36.8 C) (10/31 0824) Pulse Rate:  [77-97] 96 (10/31 0900) Resp:  [12-31] 19 (10/31 0900) BP: (115-172)/(52-85) 158/64 mmHg (10/31 0900) SpO2:  [92 %-100 %] 100 % (10/31 0900)  Intake/Output from previous day: 10/30 0701 - 10/31 0700 In: 2445 [P.O.:720; I.V.:1725] Out: 1175 [Urine:1000; Drains:175] Intake/Output this shift: Total I/O In: 550 [P.O.:400; I.V.:150] Out: -   Alert, oriented, conversant, w/o c/o pain. MAEW. PEARL. JP ~176ml overnight.  Blood-tinged/peach color at present. Drsg intact.  Lab Results: No results found for this basename: WBC, HGB, HCT, PLT,  in the last 72 hours BMET No results found for this basename: NA, K, CL, CO2, GLUCOSE, BUN, CREATININE, CALCIUM,  in the last 72 hours  Studies/Results: No results found.  Assessment/Plan: Improving   LOS: 3 days  Dr. Venetia Maxon will assess drain & activity.   Georgiann Cocker 11/02/2012, 10:58 AM

## 2012-11-02 NOTE — Progress Notes (Signed)
Patient likely had seizure.  Will follow and continue Keppra.

## 2012-11-02 NOTE — Progress Notes (Signed)
Patient ID: Beverly [name], female   DOB: Jul 19, 1932, 79 y.o.   MRN: [id number]  JP just emptied of 35ml. Per Dr. Venetia Maxon, d/c JP & apply staple to skin edges, cover with DSD.  JP pulled after removal of stay suture. Pressure to site, staple to incision. DSD. Pt tolerated all well. No bleeding or drainage from site.  Beverly Cocker, RN, BSN

## 2012-11-03 MED ORDER — CALCIUM CARBONATE ANTACID 500 MG PO CHEW
2.0000 | CHEWABLE_TABLET | Freq: Two times a day (BID) | ORAL | Status: DC
  Administered 2012-11-03 – 2012-11-04 (×2): 400 mg via ORAL
  Filled 2012-11-03 (×4): qty 2

## 2012-11-03 MED ORDER — POTASSIUM CHLORIDE IN NACL 20-0.9 MEQ/L-% IV SOLN: INTRAVENOUS | Status: DC

## 2012-11-03 NOTE — Progress Notes (Signed)
Doing well. No further speech problems   Temp:  [97.5 F (36.4 C)-98.2 F (36.8 C)] 98.2 F (36.8 C) (11/01 0345) Pulse Rate:  [79-100] 90 (11/01 0600) Resp:  [12-30] 30 (11/01 0500) BP: (124-168)/(54-86) 156/79 mmHg (11/01 0600) SpO2:  [89 %-100 %] 98 % (11/01 0600) Good strength and sensation Incision CDI  Plan: Transfer to floor -

## 2012-11-03 NOTE — Evaluation (Signed)
Physical Therapy Evaluation and Discharge Patient Details Name: Beverly [name] MRN: [id number] DOB: 1932-11-11 Today's Date: 11/03/2012 Time: 4098-1191 PT Time Calculation (min): 15 min  PT Assessment / Plan / Recommendation History of Present Illness  pt s/p Left Craniotomy for evacuation of subdural hematoma   Clinical Impression  Pt functioning at baseline. Denies dizziness/lightheadedness with mobility. Pt with good balance as demonstrated by 23/24 on DGI. Pt functioning at mod I level and is safe to d/c home with spouse once medically stable. Pt with no further acute skilled PT needs. PT signing off. Please re-consult if needed in future. Thanks!    PT Assessment  Patent does not need any further PT services    Follow Up Recommendations  No PT follow up    Does the patient have the potential to tolerate intense rehabilitation      Barriers to Discharge        Equipment Recommendations  None recommended by PT    Recommendations for Other Services     Frequency      Precautions / Restrictions Precautions Precautions: None Restrictions Weight Bearing Restrictions: No   Pertinent Vitals/Pain Denies pain      Mobility  Bed Mobility Bed Mobility: Supine to Sit Supine to Sit: 7: Independent;HOB flat Details for Bed Mobility Assistance: safe technique Transfers Transfers: Sit to Stand;Stand to Sit Sit to Stand: With upper extremity assist;From bed;6: Modified independent (Device/Increase time) Stand to Sit: 6: Modified independent (Device/Increase time);With armrests;To chair/3-in-1 Details for Transfer Assistance: safe technique Ambulation/Gait Ambulation/Gait Assistance: 6: Modified independent (Device/Increase time) Ambulation Distance (Feet): 200 Feet Assistive device: None Ambulation/Gait Assistance Details: pt with altered gait pattern due to leg length discrepancy however pt steady and had no episodes of LOB Gait Pattern: Step-through pattern Gait velocity:  wfl Stairs: Yes Stairs Assistance: 6: Modified independent (Device/Increase time) Stair Management Technique: One rail Right;Alternating pattern Number of Stairs: 10    Exercises     PT Diagnosis:    PT Problem List:   PT Treatment Interventions:       PT Goals(Current goals can be found in the care plan section) Acute Rehab PT Goals Patient Stated Goal: home ASAP PT Goal Formulation: No goals set, d/c therapy  Visit Information  Last PT Received On: 11/03/12 Assistance Needed: +1 History of Present Illness: pt s/p Left Craniotomy for evacuation of subdural hematoma        Prior Functioning  Home Living Family/patient expects to be discharged to:: Private residence Living Arrangements: Spouse/significant other Available Help at Discharge: Family;Available 24 hours/day Type of Home: House Home Access: Stairs to enter [employer] of Steps: 3 Entrance Stairs-Rails: Right Home Layout: One level Home Equipment: Walker - 2 wheels Prior Function Level of Independence: Independent Communication Communication: No difficulties Dominant Hand: Right    Cognition  Cognition Arousal/Alertness: Awake/alert Behavior During Therapy: WFL for tasks assessed/performed Overall Cognitive Status: Within Functional Limits for tasks assessed    Extremity/Trunk Assessment Upper Extremity Assessment Upper Extremity Assessment: Overall WFL for tasks assessed Lower Extremity Assessment Lower Extremity Assessment: Overall WFL for tasks assessed Cervical / Trunk Assessment Cervical / Trunk Assessment: Normal   Balance Standardized Balance Assessment Standardized Balance Assessment: Dynamic Gait Index Dynamic Gait Index Level Surface: Normal Change in Gait Speed: Normal Gait with Horizontal Head Turns: Normal Gait with Vertical Head Turns: Normal Gait and Pivot Turn: Normal Step Over Obstacle: Normal Step Around Obstacles: Normal Steps: Mild Impairment Total Score: 23   End of Session PT - End  of Session Equipment Utilized During Treatment: Gait belt Activity Tolerance: Patient tolerated treatment well Patient left: in chair;with call bell/phone within reach Nurse Communication: Mobility status  GP     Marcene Brawn 11/03/2012, 3:58 PM  Lewis Shock, PT, DPT Pager #: [phone] Office #: [phone]

## 2012-11-04 MED ORDER — LEVETIRACETAM 500 MG PO TABS: 500.0000 mg | ORAL_TABLET | Freq: Two times a day (BID) | ORAL | Status: DC

## 2012-11-04 NOTE — Discharge Summary (Signed)
Physician Discharge Summary  Patient ID: Beverly [name] MRN: [id number] DOB/AGE: [birth year] 79 y.o.  Admit date: 10/30/2012 Discharge date: 11/04/2012  Admission Diagnoses:Left Subdural hematoma with aphasia and hemiparesis   Discharge Diagnoses: Left Subdural hematoma with aphasia and hemiparesis  Active Problems:   * No active hospital problems. *   Discharged Condition: good  Hospital Course: pt with HA and episodic speech arrest, found to have Left SDH , pt admitted for surgery.  Pt underwent procedre and has done well  - has ICU observation  - had drain in place and was improving.  Did have 1 episode of speech arrest immediately after drain removed, pt went back to baseline and no problems since.  Has been up ambulating, eating well  -   Consults: None    Treatments: surgery: CRANIOTOMY HEMATOMA EVACUATION SUBDURAL (Left) - Left Craniotomy for evacuation of subdural hematoma      Discharge Exam: Blood pressure 139/84, pulse 90, temperature 98.3 F (36.8 C), temperature source Oral, resp. rate 18, height 5\' 2"  (1.575 m), weight 68 kg (149 lb 14.6 oz), SpO2 97.00%. Wound:c/d/i  - motor 5/5, speech fluent  Disposition: home     Medication List    STOP taking these medications       aspirin 81 MG tablet      TAKE these medications       ALPRAZolam 0.5 MG tablet  Commonly known as:  XANAX  Take 0.5 mg by mouth 3 (three) times daily as needed for anxiety.     Calcium Carbonate-Vitamin D 600-400 MG-UNIT per tablet  Take 1 tablet by mouth 2 (two) times daily.     celecoxib 200 MG capsule  Commonly known as:  CELEBREX  Take 200 mg by mouth daily.     cholecalciferol 1000 UNITS tablet  Commonly known as:  VITAMIN D  Take 1,000 Units by mouth daily.     ferrous sulfate 325 (65 FE) MG tablet  Take 325 mg by mouth daily with breakfast.     fluticasone 50 MCG/ACT nasal spray  Commonly known as:  FLONASE  Place 1 spray into both nostrils daily.     HYDROcodone-acetaminophen 7.5-325 MG per tablet  Commonly known as:  NORCO  Take 1 tablet by mouth every 6 (six) hours as needed for pain.     indapamide 2.5 MG tablet  Commonly known as:  LOZOL  Take 2.5 mg by mouth daily.     levETIRAcetam 500 MG tablet  Commonly known as:  KEPPRA  Take 1 tablet (500 mg total) by mouth 2 (two) times daily.     methocarbamol 750 MG tablet  Commonly known as:  ROBAXIN  Take 750 mg by mouth 2 (two) times daily.     metoprolol succinate 50 MG 24 hr tablet  Commonly known as:  TOPROL-XL  Take 25 mg by mouth daily. Take with or immediately following a meal.     pantoprazole 40 MG tablet  Commonly known as:  PROTONIX  Take 40 mg by mouth daily.     potassium chloride 10 MEQ tablet  Commonly known as:  K-DUR  Take 20 mEq by mouth daily.     PRESERVISION/LUTEIN PO  Take 1 tablet by mouth 2 (two) times daily.     SYSTANE BALANCE OP  Place 1-2 drops into both eyes daily as needed (dry eyes).     TUSSIN COUGH PO  Take 20 mLs by mouth daily as needed (cough).  SignedClydene Fake, MD 11/04/2012, 8:53 AM

## 2012-12-03 ENCOUNTER — Ambulatory Visit
Admission: RE | Admit: 2012-12-03 | Discharge: 2012-12-03 | Disposition: A | Discharge status: Home / Self Care | Payer: Medicare Other | Source: Ambulatory Visit | Attending: Family Medicine | Admitting: Family Medicine

## 2012-12-03 ENCOUNTER — Other Ambulatory Visit: Payer: Self-pay | Admitting: Neurosurgery

## 2012-12-03 DIAGNOSIS — S065X9A Traumatic subdural hemorrhage with loss of consciousness of unspecified duration, initial encounter: Secondary | ICD-10-CM

## 2012-12-03 DIAGNOSIS — S065XAA Traumatic subdural hemorrhage with loss of consciousness status unknown, initial encounter: Secondary | ICD-10-CM

## 2013-01-21 ENCOUNTER — Ambulatory Visit (INDEPENDENT_AMBULATORY_CARE_PROVIDER_SITE_OTHER): Payer: Medicare Other | Admitting: Neurology

## 2013-01-21 ENCOUNTER — Encounter: Payer: Self-pay | Admitting: Neurology

## 2013-01-21 VITALS — BP 140/80 | HR 74 | Temp 97.8°F | Ht 62.0 in | Wt 151.0 lb

## 2013-01-21 DIAGNOSIS — S065XAA Traumatic subdural hemorrhage with loss of consciousness status unknown, initial encounter: Secondary | ICD-10-CM

## 2013-01-21 DIAGNOSIS — I6200 Nontraumatic subdural hemorrhage, unspecified: Secondary | ICD-10-CM

## 2013-01-21 DIAGNOSIS — G40109 Localization-related (focal) (partial) symptomatic epilepsy and epileptic syndromes with simple partial seizures, not intractable, without status epilepticus: Secondary | ICD-10-CM

## 2013-01-21 DIAGNOSIS — S065X9A Traumatic subdural hemorrhage with loss of consciousness of unspecified duration, initial encounter: Secondary | ICD-10-CM

## 2013-01-21 NOTE — Progress Notes (Signed)
NEUROLOGY CONSULTATION NOTE  Beverly [name] MRN: [id number] DOB: [birth year]  Referring provider: Dr. Venetia MaxonStern Primary care provider: Dr. Renard MatterMcInnis  Reason for consult:  Seizure  HISTORY OF PRESENT ILLNESS: Beverly [name] is a 80 year old right-handed woman with history of back and neck pain who presents for post-hospital visit for management of seizure.  Records and images were personally reviewed where available.    In early October 2014, she tripped and fell, hitting her left temple.  She did not experience lose of consciousness or much pain.  About 2 weeks later, she had a couple of episodes where she was talking nonsensical.  There was no loss of consciousness or abnormal movement.  She presented to the ED on 10/18/12.  CT of the head revealed a small left subdural hematoma, extending over the left anterolateral frontal lobes and over the left anterior temporal lobe, measuring 8mm and causing mild mass effect and 3 mm midline shift to the right.  MRI of brain revealed left SDH extending over the convexity with probable subarachnoid blood as well, with 2.5 mm left to right midline shift.  MRA of the head showed mild distal small vessel disease.   EEG performed 10/19/12 was normal.  Neursurgery did not think any emergent surgery was warranted and recommended conservative treatment.  She underwent observation and was started on Keppra 250mg  BID as a preventative.  Potassium and sodium were low, which were replaced.  She did well and was clinically and neurologically stable so she was discharged on 10/21/12 with follow up with neurosurgery.  After discharge, she had another episode and her Keppra was increased to 500mg  BID.  On 10/30/12, she underwent craniotomy with evacuation as performed by Dr. Venetia MaxonStern.  She was observed in the ICU and was improving with a drain.  Immediately after removal of the drain, she briefly had an episode of speech arrest, which resolved.  Keppra was increased to 1000mg  BID.  She  did develop a rash on her upper chest, and it was questioned whether it was secondary to Keppra, but it had improved even when increasing the dose of the Keppra.  She has been doing well and has not had another seizure since the surgery.  Her Keppra was reduced last week to 500mg  BID.  Repeat CT of head from 12/03/12 no longer revealed subdural fluid collection.  She was told not to drive.  PAST MEDICAL HISTORY: Past Medical History  Diagnosis Date  . Arthritis   . Hearing loss, central     Left ear  . Hypertension   . Anxiety   . COPD (chronic obstructive pulmonary disease)   . Seizures     no seizures, speech issues  . GERD (gastroesophageal reflux disease)   . Headache(784.0)   . Memory changes   . Coronary artery disease     PAST SURGICAL HISTORY: Past Surgical History  Procedure Laterality Date  . Joint replacement Bilateral     knees  . Abdominal hysterectomy    . Appendectomy    . Cardiac catheterization      15 yrs ago  . Craniotomy Left 10/30/2012    Procedure: CRANIOTOMY HEMATOMA EVACUATION SUBDURAL;  Surgeon: Maeola HarmanJoseph Stern, MD;  Location: [facility];  Service: Neurosurgery;  Laterality: Left;  Left Craniotomy for evacuation of subdural hematoma    MEDICATIONS: Current Outpatient Prescriptions on File Prior to Visit  Medication Sig Dispense Refill  . ALPRAZolam (XANAX) 0.5 MG tablet Take 0.5 mg by mouth 3 (three) times  daily as needed for anxiety.      . Calcium Carbonate-Vitamin D 600-400 MG-UNIT per tablet Take 1 tablet by mouth 2 (two) times daily.      . celecoxib (CELEBREX) 200 MG capsule Take 200 mg by mouth daily.      . cholecalciferol (VITAMIN D) 1000 UNITS tablet Take 1,000 Units by mouth daily.      Marland Kitchen Dextromethorphan HBr (TUSSIN COUGH PO) Take 20 mLs by mouth daily as needed (cough).      . ferrous sulfate 325 (65 FE) MG tablet Take 325 mg by mouth daily with breakfast.      . fluticasone (FLONASE) 50 MCG/ACT nasal spray Place 1 spray into both nostrils  daily.       Marland Kitchen HYDROcodone-acetaminophen (NORCO) 7.5-325 MG per tablet Take 1 tablet by mouth every 6 (six) hours as needed for pain.       . indapamide (LOZOL) 2.5 MG tablet Take 2.5 mg by mouth daily.      Marland Kitchen levETIRAcetam (KEPPRA) 500 MG tablet Take 1 tablet (500 mg total) by mouth 2 (two) times daily.  60 tablet  3  . methocarbamol (ROBAXIN) 750 MG tablet Take 750 mg by mouth 2 (two) times daily.       . metoprolol succinate (TOPROL-XL) 50 MG 24 hr tablet Take 25 mg by mouth daily. Take with or immediately following a meal.      . Multiple Vitamins-Minerals (PRESERVISION/LUTEIN PO) Take 1 tablet by mouth 2 (two) times daily.      . pantoprazole (PROTONIX) 40 MG tablet Take 40 mg by mouth daily.      . potassium chloride (K-DUR) 10 MEQ tablet Take 20 mEq by mouth daily.      Marland Kitchen Propylene Glycol (SYSTANE BALANCE OP) Place 1-2 drops into both eyes daily as needed (dry eyes).       No current facility-administered medications on file prior to visit.    ALLERGIES: Allergies  Allergen Reactions  . Penicillins Itching    FAMILY HISTORY: History reviewed. No pertinent family history.  SOCIAL HISTORY: History   Social History  . Marital Status: Married    Spouse Name: N/A    Number of Children: N/A  . Years of Education: N/A   Occupational History  . Not on file.   Social History Main Topics  . Smoking status: Never Smoker   . Smokeless tobacco: Never Used  . Alcohol Use: No  . Drug Use: No  . Sexual Activity: Not on file   Other Topics Concern  . Not on file   Social History Narrative  . No narrative on file    REVIEW OF SYSTEMS: Constitutional: No fevers, chills, or sweats, no generalized fatigue, change in appetite Eyes: No visual changes, double vision, eye pain Ear, nose and throat: No hearing loss, ear pain, nasal congestion, sore throat Cardiovascular: No chest pain, palpitations Respiratory:  No shortness of breath at rest or with exertion,  wheezes GastrointestinaI: No nausea, vomiting, diarrhea, abdominal pain, fecal incontinence Genitourinary:  No dysuria, urinary retention or frequency Musculoskeletal:  No neck pain, back pain Integumentary: No rash, pruritus, skin lesions Neurological: as above Psychiatric: No depression, insomnia, anxiety Endocrine: No palpitations, fatigue, diaphoresis, mood swings, change in appetite, change in weight, increased thirst Hematologic/Lymphatic:  No anemia, purpura, petechiae. Allergic/Immunologic: no itchy/runny eyes, nasal congestion, recent allergic reactions, rashes  PHYSICAL EXAM: Filed Vitals:   01/21/13 1235  BP: 140/80  Pulse: 74  Temp: 97.8 F (36.6 C)  General: No acute distress Head:  Normocephalic/atraumatic Neck: supple, no paraspinal tenderness, full range of motion Back: No paraspinal tenderness Heart: regular rate and rhythm Lungs: Clear to auscultation bilaterally. Vascular: No carotid bruits. Neurological Exam: Mental status: alert and oriented to person, place, and time, speech fluent and not dysarthric, language intact. Cranial nerves: CN I: not tested CN II: pupils equal, round and reactive to light, visual fields intact, fundi unremarkable. CN III, IV, VI:  full range of motion, no nystagmus, no ptosis CN V: facial sensation intact CN VII: upper and lower face symmetric CN VIII: hearing reduced on left. CN IX, X: gag intact, uvula midline CN XI: sternocleidomastoid and trapezius muscles intact CN XII: tongue midline Bulk & Tone: normal, no fasciculations. Motor: 5/5 throughout Sensation: temperature and vibration intact. Deep Tendon Reflexes: 2+ throughout, toe up on right Finger to nose testing: normal Gait: walks with limp.  Difficulty with tandem walk. Romberg negative.  IMPRESSION: 1.  Simple partial seizures secondary to subdural hematoma  PLAN: 1.  Continue Keppra 500mg  BID 2.  May resume driving, as her seizures do not present with loss  of consciousness/awareness or bodily control 3.  Follow up in 3 months.  May consider tapering off of Keppra at that point.  Would likely repeat EEG before tapering off.  Thank you for allowing me to take part in the care of this patient.  Shon Millet, DO  CC:  Butch Penny, MD  Maeola Harman, MD

## 2013-01-21 NOTE — Patient Instructions (Signed)
1.  Continue Keppra 500mg  twice daily for now. 2.  Since you do not lose consciousness or lose bodily control during your seizure, I think it should be okay to drive. 3.  From my standpoint, I don't see any problems getting epidural injections in your neck or back. 4.  Follow up in 3 months.

## 2013-02-14 ENCOUNTER — Other Ambulatory Visit: Payer: Self-pay | Admitting: Family Medicine

## 2013-02-14 DIAGNOSIS — M549 Dorsalgia, unspecified: Secondary | ICD-10-CM

## 2013-02-19 ENCOUNTER — Other Ambulatory Visit: Payer: Medicare Other

## 2013-03-07 ENCOUNTER — Other Ambulatory Visit: Payer: Self-pay | Admitting: Family Medicine

## 2013-03-07 ENCOUNTER — Ambulatory Visit
Admission: RE | Admit: 2013-03-07 | Discharge: 2013-03-07 | Disposition: A | Discharge status: Home / Self Care | Payer: Medicare Other | Source: Ambulatory Visit | Attending: Family Medicine | Admitting: Family Medicine

## 2013-03-07 DIAGNOSIS — M549 Dorsalgia, unspecified: Secondary | ICD-10-CM

## 2013-03-07 DIAGNOSIS — M542 Cervicalgia: Secondary | ICD-10-CM

## 2013-03-07 MED ORDER — IOHEXOL 180 MG/ML  SOLN
1.0000 mL | Freq: Once | INTRAMUSCULAR | Status: AC | PRN
  Administered 2013-03-07: 1 mL via EPIDURAL

## 2013-03-07 MED ORDER — METHYLPREDNISOLONE ACETATE 40 MG/ML INJ SUSP (RADIOLOG
120.0000 mg | Freq: Once | INTRAMUSCULAR | Status: AC
  Administered 2013-03-07: 120 mg via EPIDURAL

## 2013-03-07 NOTE — Discharge Instructions (Signed)

## 2013-03-26 ENCOUNTER — Other Ambulatory Visit: Payer: Self-pay | Admitting: Family Medicine

## 2013-03-26 ENCOUNTER — Ambulatory Visit
Admission: RE | Admit: 2013-03-26 | Discharge: 2013-03-26 | Disposition: A | Discharge status: Home / Self Care | Payer: Medicare Other | Source: Ambulatory Visit | Attending: Family Medicine | Admitting: Family Medicine

## 2013-03-26 DIAGNOSIS — R52 Pain, unspecified: Secondary | ICD-10-CM

## 2013-03-26 DIAGNOSIS — M542 Cervicalgia: Secondary | ICD-10-CM

## 2013-03-26 MED ORDER — IOHEXOL 300 MG/ML  SOLN
1.0000 mL | Freq: Once | INTRAMUSCULAR | Status: AC | PRN
  Administered 2013-03-26: 1 mL via EPIDURAL

## 2013-03-26 MED ORDER — TRIAMCINOLONE ACETONIDE 40 MG/ML IJ SUSP (RADIOLOGY)
60.0000 mg | Freq: Once | INTRAMUSCULAR | Status: AC
  Administered 2013-03-26: 60 mg via EPIDURAL

## 2013-04-12 ENCOUNTER — Ambulatory Visit
Admission: RE | Admit: 2013-04-12 | Discharge: 2013-04-12 | Disposition: A | Discharge status: Home / Self Care | Payer: Medicare Other | Source: Ambulatory Visit | Attending: Family Medicine | Admitting: Family Medicine

## 2013-04-12 DIAGNOSIS — R52 Pain, unspecified: Secondary | ICD-10-CM

## 2013-04-12 MED ORDER — IOHEXOL 180 MG/ML  SOLN
1.0000 mL | Freq: Once | INTRAMUSCULAR | Status: AC | PRN
  Administered 2013-04-12: 1 mL via EPIDURAL

## 2013-04-12 MED ORDER — METHYLPREDNISOLONE ACETATE 40 MG/ML INJ SUSP (RADIOLOG
120.0000 mg | Freq: Once | INTRAMUSCULAR | Status: AC
  Administered 2013-04-12: 120 mg via EPIDURAL

## 2013-04-12 NOTE — Discharge Instructions (Signed)

## 2013-04-12 NOTE — Progress Notes (Signed)
Pt's blood pressure is always severely high when she is here. She states it's due to the extra salt the dr. has her on for low sodium.

## 2013-04-15 ENCOUNTER — Ambulatory Visit (INDEPENDENT_AMBULATORY_CARE_PROVIDER_SITE_OTHER): Payer: Medicare Other | Admitting: Neurology

## 2013-04-15 ENCOUNTER — Encounter: Payer: Self-pay | Admitting: Neurology

## 2013-04-15 VITALS — BP 130/80 | HR 70 | Temp 98.0°F | Resp 16 | Ht 61.0 in | Wt 151.9 lb

## 2013-04-15 DIAGNOSIS — S065XAA Traumatic subdural hemorrhage with loss of consciousness status unknown, initial encounter: Secondary | ICD-10-CM

## 2013-04-15 DIAGNOSIS — S065X9A Traumatic subdural hemorrhage with loss of consciousness of unspecified duration, initial encounter: Secondary | ICD-10-CM

## 2013-04-15 DIAGNOSIS — G40109 Localization-related (focal) (partial) symptomatic epilepsy and epileptic syndromes with simple partial seizures, not intractable, without status epilepticus: Secondary | ICD-10-CM

## 2013-04-15 DIAGNOSIS — I6200 Nontraumatic subdural hemorrhage, unspecified: Secondary | ICD-10-CM

## 2013-04-15 NOTE — Progress Notes (Signed)
NEUROLOGY FOLLOW UP OFFICE NOTE  Beverly [name] [id number]  HISTORY OF PRESENT ILLNESS: Beverly [name] is a 80 year old right-handed woman with history of back and neck pain who follows up for simple partial seizures secondary to subdural hematoma.  Records and images were personally reviewed where available.    UPDATE: Doing well.  No seizures.  Tolerating the Keppra.  HISTORY: In early October 2014, she tripped and fell, hitting her left temple.  She did not experience lose of consciousness or much pain.  About 2 weeks later, she had a couple of episodes where she was talking nonsensical.  There was no loss of consciousness or abnormal movement.  She presented to the ED on 10/18/12.  CT of the head revealed a small left subdural hematoma, extending over the left anterolateral frontal lobes and over the left anterior temporal lobe, measuring 8mm and causing mild mass effect and 3 mm midline shift to the right.  MRI of brain revealed left SDH extending over the convexity with probable subarachnoid blood as well, with 2.5 mm left to right midline shift.  MRA of the head showed mild distal small vessel disease.   EEG performed 10/19/12 was normal.  Neursurgery did not think any emergent surgery was warranted and recommended conservative treatment.  She underwent observation and was started on Keppra 250mg  BID as a preventative.  Potassium and sodium were low, which were replaced.  She did well and was clinically and neurologically stable so she was discharged on 10/21/12 with follow up with neurosurgery.  After discharge, she had another episode and her Keppra was increased to 500mg  BID.  On 10/30/12, she underwent craniotomy with evacuation as performed by Dr. Venetia MaxonStern.  She was observed in the ICU and was improving with a drain.  Immediately after removal of the drain, she briefly had an episode of speech arrest, which resolved.  Keppra was increased to 1000mg  BID.  She did develop a rash on her upper chest,  and it was questioned whether it was secondary to Keppra, but it had improved even when increasing the dose of the Keppra.  She has been doing well and has not had another seizure since the surgery.    She currently takes Keppra 500mg  twice daily.  Walks with limp.  PAST MEDICAL HISTORY: Past Medical History  Diagnosis Date  . Arthritis   . Hearing loss, central     Left ear  . Hypertension   . Anxiety   . COPD (chronic obstructive pulmonary disease)   . Seizures     no seizures, speech issues  . GERD (gastroesophageal reflux disease)   . Headache(784.0)   . Memory changes   . Coronary artery disease     MEDICATIONS: Current Outpatient Prescriptions on File Prior to Visit  Medication Sig Dispense Refill  . ALPRAZolam (XANAX) 0.5 MG tablet Take 0.5 mg by mouth 3 (three) times daily as needed for anxiety.      . Calcium Carbonate-Vitamin D 600-400 MG-UNIT per tablet Take 1 tablet by mouth 2 (two) times daily.      . celecoxib (CELEBREX) 200 MG capsule Take 200 mg by mouth daily.      . cholecalciferol (VITAMIN D) 1000 UNITS tablet Take 1,000 Units by mouth daily.      Marland Kitchen. Dextromethorphan HBr (TUSSIN COUGH PO) Take 20 mLs by mouth daily as needed (cough).      . ferrous sulfate 325 (65 FE) MG tablet Take 325 mg by mouth daily with  breakfast.      . fluticasone (FLONASE) 50 MCG/ACT nasal spray Place 1 spray into both nostrils daily.       Marland Kitchen HYDROcodone-acetaminophen (NORCO) 7.5-325 MG per tablet Take 1 tablet by mouth every 6 (six) hours as needed for pain.       Marland Kitchen levETIRAcetam (KEPPRA) 500 MG tablet Take 1 tablet (500 mg total) by mouth 2 (two) times daily.  60 tablet  3  . methocarbamol (ROBAXIN) 750 MG tablet Take 750 mg by mouth 2 (two) times daily.       . metoprolol succinate (TOPROL-XL) 50 MG 24 hr tablet Take 25 mg by mouth daily. Take with or immediately following a meal.      . Multiple Vitamins-Minerals (PRESERVISION/LUTEIN PO) Take 1 tablet by mouth 2 (two) times daily.       . pantoprazole (PROTONIX) 40 MG tablet Take 40 mg by mouth daily.      . potassium chloride (K-DUR) 10 MEQ tablet Take 20 mEq by mouth daily.      Marland Kitchen Propylene Glycol (SYSTANE BALANCE OP) Place 1-2 drops into both eyes daily as needed (dry eyes).      . indapamide (LOZOL) 2.5 MG tablet Take 2.5 mg by mouth daily.       No current facility-administered medications on file prior to visit.    ALLERGIES: Allergies  Allergen Reactions  . Penicillins Itching    FAMILY HISTORY: Family History  Problem Relation Age of Onset  . Ataxia Neg Hx   . Chorea Neg Hx   . Dementia Neg Hx   . Mental retardation Neg Hx   . Migraines Neg Hx   . Multiple sclerosis Neg Hx   . Neurofibromatosis Neg Hx   . Neuropathy Neg Hx   . Parkinsonism Neg Hx   . Seizures Neg Hx   . Stroke Neg Hx     SOCIAL HISTORY: History   Social History  . Marital Status: Married    Spouse Name: N/A    Number of Children: N/A  . Years of Education: N/A   Occupational History  . Not on file.   Social History Main Topics  . Smoking status: Never Smoker   . Smokeless tobacco: Never Used  . Alcohol Use: No  . Drug Use: No  . Sexual Activity: Not on file   Other Topics Concern  . Not on file   Social History Narrative  . No narrative on file    REVIEW OF SYSTEMS: Constitutional: No fevers, chills, or sweats, no generalized fatigue, change in appetite Eyes: No visual changes, double vision, eye pain Ear, nose and throat: No hearing loss, ear pain, nasal congestion, sore throat Cardiovascular: No chest pain, palpitations Respiratory:  No shortness of breath at rest or with exertion, wheezes GastrointestinaI: No nausea, vomiting, diarrhea, abdominal pain, fecal incontinence Genitourinary:  No dysuria, urinary retention or frequency Musculoskeletal:  Back pain Integumentary: No rash, pruritus, skin lesions Neurological: as above Psychiatric: No depression, insomnia, anxiety Endocrine: No palpitations,  fatigue, diaphoresis, mood swings, change in appetite, change in weight, increased thirst Hematologic/Lymphatic:  No anemia, purpura, petechiae. Allergic/Immunologic: no itchy/runny eyes, nasal congestion, recent allergic reactions, rashes  PHYSICAL EXAM: Filed Vitals:   04/15/13 1516  BP: 130/80  Pulse: 70  Temp: 98 F (36.7 C)  Resp: 16   General: No acute distress Head:  Normocephalic/atraumatic Neck: supple, no paraspinal tenderness, full range of motion Heart:  Regular rate and rhythm Lungs:  Clear to auscultation bilaterally Back: No  paraspinal tenderness Neurological Exam: alert and oriented to person, place, and time. Attention span and concentration intact, recent and remote memory intact, fund of knowledge intact.  Speech fluent and not dysarthric, language intact.  Hearing loss on left, otherwise CN II-XII intact. Fundoscopic exam unremarkable without vessel changes, exudates, hemorrhages or papilledema.  Bulk and tone normal, muscle strength 5/5 throughout.  Sensation to light touch, temperature and vibration intact.  Deep tendon reflexes 2+ throughout except absent in the ankles, toes downgoing.  Finger to nose and heel to shin testing intact.  Gait with limp.  Straight leg raise and FABER test negative.  IMPRESSION: Simple partial seizures secondary to subdural hematoma  PLAN: Continue Keppra 1000mg  twice daily Follow up in 6 months.  Shon MilletAdam Aviannah Castoro, DO  CC: Butch PennyAngus McInnis, MD

## 2013-04-15 NOTE — Patient Instructions (Addendum)
Continue Keppra.  Follow-up in 6 months.

## 2013-05-22 ENCOUNTER — Other Ambulatory Visit (HOSPITAL_COMMUNITY): Payer: Self-pay | Admitting: Pulmonary Disease

## 2013-05-22 DIAGNOSIS — Z780 Asymptomatic menopausal state: Secondary | ICD-10-CM

## 2013-05-25 ENCOUNTER — Other Ambulatory Visit: Payer: Self-pay | Admitting: Neurology

## 2013-05-28 ENCOUNTER — Other Ambulatory Visit: Payer: Self-pay | Admitting: *Deleted

## 2013-05-28 NOTE — Telephone Encounter (Signed)
Please clarify the strength of Keppra for this patient on her last visit she states she was taking 500 mg bid .please advise

## 2013-05-28 NOTE — Telephone Encounter (Signed)
Keppra 500 mg bid # 60 called to [facility]  With 3 refills

## 2013-05-29 ENCOUNTER — Ambulatory Visit (HOSPITAL_COMMUNITY)
Admission: RE | Admit: 2013-05-29 | Discharge: 2013-05-29 | Disposition: A | Discharge status: Home / Self Care | Payer: Medicare Other | Source: Ambulatory Visit | Attending: Pulmonary Disease | Admitting: Pulmonary Disease

## 2013-05-29 ENCOUNTER — Encounter (INDEPENDENT_AMBULATORY_CARE_PROVIDER_SITE_OTHER): Payer: Self-pay

## 2013-05-29 DIAGNOSIS — Z780 Asymptomatic menopausal state: Secondary | ICD-10-CM | POA: Insufficient documentation

## 2013-05-30 ENCOUNTER — Telehealth: Payer: Self-pay | Admitting: *Deleted

## 2013-05-30 ENCOUNTER — Telehealth: Payer: Self-pay | Admitting: Neurology

## 2013-05-30 MED ORDER — LEVETIRACETAM 1000 MG PO TABS: 1000.0000 mg | ORAL_TABLET | Freq: Two times a day (BID) | ORAL | Status: DC

## 2013-05-30 NOTE — Telephone Encounter (Signed)
Keppra 500 refused and RX sent for Keppra 1000 per last office note.

## 2013-05-30 NOTE — Telephone Encounter (Signed)
Keppra 500 mg 1 po bid called to RX  Clarified with patient

## 2013-05-30 NOTE — Telephone Encounter (Signed)
Should be 500mg  twice daily

## 2013-05-30 NOTE — Telephone Encounter (Signed)
Spoke with patient she is taking Keppra 500 mg 1 po bid  RX was picked up yesterday 05/29/13

## 2013-07-04 ENCOUNTER — Other Ambulatory Visit: Payer: Self-pay | Admitting: Pulmonary Disease

## 2013-07-04 DIAGNOSIS — M542 Cervicalgia: Secondary | ICD-10-CM

## 2013-08-26 ENCOUNTER — Other Ambulatory Visit: Payer: Self-pay | Admitting: Neurology

## 2013-10-14 ENCOUNTER — Encounter: Payer: Self-pay | Admitting: Neurology

## 2013-10-14 ENCOUNTER — Ambulatory Visit (INDEPENDENT_AMBULATORY_CARE_PROVIDER_SITE_OTHER): Payer: Medicare Other | Admitting: Neurology

## 2013-10-14 VITALS — BP 160/90 | HR 79 | Ht 62.0 in | Wt 149.0 lb

## 2013-10-14 DIAGNOSIS — G40109 Localization-related (focal) (partial) symptomatic epilepsy and epileptic syndromes with simple partial seizures, not intractable, without status epilepticus: Secondary | ICD-10-CM

## 2013-10-14 DIAGNOSIS — I6200 Nontraumatic subdural hemorrhage, unspecified: Secondary | ICD-10-CM

## 2013-10-14 DIAGNOSIS — S065XAA Traumatic subdural hemorrhage with loss of consciousness status unknown, initial encounter: Secondary | ICD-10-CM

## 2013-10-14 DIAGNOSIS — S065X9A Traumatic subdural hemorrhage with loss of consciousness of unspecified duration, initial encounter: Secondary | ICD-10-CM

## 2013-10-14 NOTE — Progress Notes (Signed)
NEUROLOGY FOLLOW UP OFFICE NOTE  Volney AmericanJean C Caggiano [id number]  HISTORY OF PRESENT ILLNESS: Jana HalfJean Glazier is a 80 year old right-handed woman with history of back and neck pain who follows up for simple partial seizures secondary to subdural hematoma.   She is accompanied by her daughter.   UPDATE: She is currently taking Keppra 500mg  twice daily.  Last seizure was in October 2014.  She is doing well.    HISTORY: In early October 2014, she tripped and fell, hitting her left temple.  She did not experience lose of consciousness or much pain.  About 2 weeks later, she had a couple of episodes where she was talking nonsensical.  There was no loss of consciousness or abnormal movement.  She presented to the ED on 10/18/12.  CT of the head revealed a small left subdural hematoma, extending over the left anterolateral frontal lobes and over the left anterior temporal lobe, measuring 8mm and causing mild mass effect and 3 mm midline shift to the right.  MRI of brain revealed left SDH extending over the convexity with probable subarachnoid blood as well, with 2.5 mm left to right midline shift.  MRA of the head showed mild distal small vessel disease.   EEG performed 10/19/12 was normal.  Neursurgery did not think any emergent surgery was warranted and recommended conservative treatment.  She underwent observation and was started on Keppra 250mg  BID as a preventative.  Potassium and sodium were low, which were replaced.  She did well and was clinically and neurologically stable so she was discharged on 10/21/12 with follow up with neurosurgery.  After discharge, she had another episode and her Keppra was increased to 500mg  BID.  On 10/30/12, she underwent craniotomy with evacuation as performed by Dr. Venetia MaxonStern.  She was observed in the ICU and was improving with a drain.  Immediately after removal of the drain, she briefly had an episode of speech arrest, which resolved.  Keppra was increased to 1000mg  BID.  She did  develop a rash on her upper chest, and it was questioned whether it was secondary to Keppra, but it had improved even when increasing the dose of the Keppra.  She has been doing well and has not had another seizure since the surgery.  Her Keppra was subsequently decreased to 500mg  twice daily.  Walks with limp.  PAST MEDICAL HISTORY: Past Medical History  Diagnosis Date  . Arthritis   . Hearing loss, central     Left ear  . Hypertension   . Anxiety   . COPD (chronic obstructive pulmonary disease)   . Seizures     no seizures, speech issues  . GERD (gastroesophageal reflux disease)   . Headache(784.0)   . Memory changes   . Coronary artery disease     MEDICATIONS: Current Outpatient Prescriptions on File Prior to Visit  Medication Sig Dispense Refill  . ALPRAZolam (XANAX) 0.5 MG tablet Take 0.5 mg by mouth 3 (three) times daily as needed for anxiety.      . Calcium Carbonate-Vitamin D 600-400 MG-UNIT per tablet Take 1 tablet by mouth 2 (two) times daily.      . celecoxib (CELEBREX) 200 MG capsule Take 200 mg by mouth daily.      . cholecalciferol (VITAMIN D) 1000 UNITS tablet Take 1,000 Units by mouth daily.      Marland Kitchen. Dextromethorphan HBr (TUSSIN COUGH PO) Take 20 mLs by mouth daily as needed (cough).      . ferrous sulfate 325 (  65 FE) MG tablet Take 325 mg by mouth daily with breakfast.      . fluticasone (FLONASE) 50 MCG/ACT nasal spray Place 1 spray into both nostrils daily.       Marland Kitchen. HYDROcodone-acetaminophen (NORCO) 7.5-325 MG per tablet Take 1 tablet by mouth every 6 (six) hours as needed for pain.       Marland Kitchen. levETIRAcetam (KEPPRA) 500 MG tablet TAKE ONE TABLET TWICE DAILY  60 tablet  2  . methocarbamol (ROBAXIN) 750 MG tablet Take 750 mg by mouth 2 (two) times daily.       . metoprolol succinate (TOPROL-XL) 50 MG 24 hr tablet Take 25 mg by mouth daily. Take with or immediately following a meal.      . Multiple Vitamins-Minerals (PRESERVISION/LUTEIN PO) Take 1 tablet by mouth 2 (two)  times daily.      . pantoprazole (PROTONIX) 40 MG tablet Take 40 mg by mouth daily.      . potassium chloride (K-DUR) 10 MEQ tablet Take 20 mEq by mouth daily.      Marland Kitchen. Propylene Glycol (SYSTANE BALANCE OP) Place 1-2 drops into both eyes daily as needed (dry eyes).       No current facility-administered medications on file prior to visit.    ALLERGIES: Allergies  Allergen Reactions  . Penicillins Itching    FAMILY HISTORY: Family History  Problem Relation Age of Onset  . Ataxia Neg Hx   . Chorea Neg Hx   . Dementia Neg Hx   . Mental retardation Neg Hx   . Migraines Neg Hx   . Multiple sclerosis Neg Hx   . Neurofibromatosis Neg Hx   . Neuropathy Neg Hx   . Parkinsonism Neg Hx   . Seizures Neg Hx   . Stroke Neg Hx     SOCIAL HISTORY: History   Social History  . Marital Status: Married    Spouse Name: N/A    Number of Children: N/A  . Years of Education: N/A   Occupational History  . Not on file.   Social History Main Topics  . Smoking status: Never Smoker   . Smokeless tobacco: Never Used  . Alcohol Use: No  . Drug Use: No  . Sexual Activity: Not on file   Other Topics Concern  . Not on file   Social History Narrative  . No narrative on file    REVIEW OF SYSTEMS: Constitutional: No fevers, chills, or sweats, no generalized fatigue, change in appetite Eyes: No visual changes, double vision, eye pain Ear, nose and throat: No hearing loss, ear pain, nasal congestion, sore throat Cardiovascular: No chest pain, palpitations Respiratory:  No shortness of breath at rest or with exertion, wheezes GastrointestinaI: No nausea, vomiting, diarrhea, abdominal pain, fecal incontinence Genitourinary:  No dysuria, urinary retention or frequency Musculoskeletal:  No neck pain, back pain Integumentary: No rash, pruritus, skin lesions Neurological: as above Psychiatric: No depression, insomnia, anxiety Endocrine: No palpitations, fatigue, diaphoresis, mood swings, change  in appetite, change in weight, increased thirst Hematologic/Lymphatic:  No anemia, purpura, petechiae. Allergic/Immunologic: no itchy/runny eyes, nasal congestion, recent allergic reactions, rashes  PHYSICAL EXAM: Filed Vitals:   10/14/13 1534  BP: 160/90  Pulse: 79   General: No acute distress Head:  Normocephalic/atraumatic Neck: supple, no paraspinal tenderness, full range of motion Heart:  Regular rate and rhythm Lungs:  Clear to auscultation bilaterally Back: No paraspinal tenderness Neurological Exam: alert and oriented to person, place, and time. Attention span and concentration intact, recent and  remote memory intact, fund of knowledge intact. Speech fluent and not dysarthric, language intact. Hearing loss on left, otherwise CN II-XII intact. Fundi not visualized. Bulk and tone normal, muscle strength 5/5 throughout. Sensation to light touch, temperature and vibration intact. Deep tendon reflexes 2+ throughout except absent in the ankles, toes downgoing. Finger to nose testing intact. Gait with limp.   IMPRESSION: Simple partial seizures secondary to subdural hematoma, stable  PLAN: 1.  Continue Keppra 500mg  twice daily 2.  Follow up in 6 months.  Shon Millet, DO  CC:  Kari Baars, MD

## 2013-10-14 NOTE — Patient Instructions (Signed)
1.  Continue Keppra 500mg twice daily. 2.  Follow up in 6 months. 

## 2013-10-15 NOTE — Progress Notes (Signed)
faxed

## 2013-10-25 ENCOUNTER — Other Ambulatory Visit: Payer: Self-pay | Admitting: Pulmonary Disease

## 2013-10-25 DIAGNOSIS — G8929 Other chronic pain: Secondary | ICD-10-CM

## 2013-10-25 DIAGNOSIS — M545 Low back pain, unspecified: Secondary | ICD-10-CM

## 2013-10-25 DIAGNOSIS — M542 Cervicalgia: Secondary | ICD-10-CM

## 2013-12-23 ENCOUNTER — Other Ambulatory Visit: Payer: Self-pay | Admitting: Neurology

## 2014-01-10 ENCOUNTER — Ambulatory Visit
Admission: RE | Admit: 2014-01-10 | Discharge: 2014-01-10 | Disposition: A | Discharge status: Home / Self Care | Payer: Medicare Other | Source: Ambulatory Visit | Attending: Pulmonary Disease | Admitting: Pulmonary Disease

## 2014-01-10 DIAGNOSIS — M5416 Radiculopathy, lumbar region: Secondary | ICD-10-CM | POA: Diagnosis not present

## 2014-01-10 DIAGNOSIS — G8929 Other chronic pain: Secondary | ICD-10-CM

## 2014-01-10 DIAGNOSIS — M542 Cervicalgia: Secondary | ICD-10-CM

## 2014-01-10 DIAGNOSIS — M545 Low back pain: Secondary | ICD-10-CM

## 2014-01-10 MED ORDER — METHYLPREDNISOLONE ACETATE 40 MG/ML INJ SUSP (RADIOLOG
120.0000 mg | Freq: Once | INTRAMUSCULAR | Status: AC
  Administered 2014-01-10: 120 mg via EPIDURAL

## 2014-01-10 MED ORDER — IOHEXOL 180 MG/ML  SOLN
1.0000 mL | Freq: Once | INTRAMUSCULAR | Status: AC | PRN
  Administered 2014-01-10: 1 mL via EPIDURAL

## 2014-01-10 NOTE — Discharge Instructions (Signed)

## 2014-02-25 DIAGNOSIS — K210 Gastro-esophageal reflux disease with esophagitis: Secondary | ICD-10-CM | POA: Diagnosis not present

## 2014-02-25 DIAGNOSIS — I10 Essential (primary) hypertension: Secondary | ICD-10-CM | POA: Diagnosis not present

## 2014-02-25 DIAGNOSIS — R569 Unspecified convulsions: Secondary | ICD-10-CM | POA: Diagnosis not present

## 2014-02-25 DIAGNOSIS — M545 Low back pain: Secondary | ICD-10-CM | POA: Diagnosis not present

## 2014-03-21 ENCOUNTER — Other Ambulatory Visit: Payer: Self-pay | Admitting: Neurology

## 2014-03-21 NOTE — Telephone Encounter (Signed)
Rx sent 

## 2014-04-14 ENCOUNTER — Ambulatory Visit: Payer: Medicare Other | Admitting: Neurology

## 2014-04-15 ENCOUNTER — Ambulatory Visit: Payer: Medicare Other | Admitting: Neurology

## 2014-04-21 DIAGNOSIS — H3531 Nonexudative age-related macular degeneration: Secondary | ICD-10-CM | POA: Diagnosis not present

## 2014-04-21 DIAGNOSIS — H35361 Drusen (degenerative) of macula, right eye: Secondary | ICD-10-CM | POA: Diagnosis not present

## 2014-05-19 DIAGNOSIS — H35361 Drusen (degenerative) of macula, right eye: Secondary | ICD-10-CM | POA: Diagnosis not present

## 2014-05-19 DIAGNOSIS — H3531 Nonexudative age-related macular degeneration: Secondary | ICD-10-CM | POA: Diagnosis not present

## 2014-06-05 ENCOUNTER — Other Ambulatory Visit (HOSPITAL_COMMUNITY): Payer: Self-pay | Admitting: Pulmonary Disease

## 2014-06-05 DIAGNOSIS — M810 Age-related osteoporosis without current pathological fracture: Secondary | ICD-10-CM

## 2014-06-18 ENCOUNTER — Ambulatory Visit (HOSPITAL_COMMUNITY)
Admission: RE | Admit: 2014-06-18 | Discharge: 2014-06-18 | Disposition: A | Discharge status: Home / Self Care | Payer: Medicare Other | Source: Ambulatory Visit | Attending: Pulmonary Disease | Admitting: Pulmonary Disease

## 2014-06-18 DIAGNOSIS — M810 Age-related osteoporosis without current pathological fracture: Secondary | ICD-10-CM | POA: Diagnosis not present

## 2014-06-18 DIAGNOSIS — Z780 Asymptomatic menopausal state: Secondary | ICD-10-CM | POA: Insufficient documentation

## 2014-06-23 ENCOUNTER — Ambulatory Visit (INDEPENDENT_AMBULATORY_CARE_PROVIDER_SITE_OTHER): Payer: Medicare Other | Admitting: Neurology

## 2014-06-23 ENCOUNTER — Encounter: Payer: Self-pay | Admitting: Neurology

## 2014-06-23 VITALS — BP 140/84 | HR 67 | Resp 16 | Ht 62.0 in | Wt 147.0 lb

## 2014-06-23 DIAGNOSIS — G40109 Localization-related (focal) (partial) symptomatic epilepsy and epileptic syndromes with simple partial seizures, not intractable, without status epilepticus: Secondary | ICD-10-CM | POA: Diagnosis not present

## 2014-06-23 DIAGNOSIS — Z8679 Personal history of other diseases of the circulatory system: Secondary | ICD-10-CM | POA: Diagnosis not present

## 2014-06-23 DIAGNOSIS — Z87828 Personal history of other (healed) physical injury and trauma: Secondary | ICD-10-CM

## 2014-06-23 NOTE — Progress Notes (Signed)
NEUROLOGY FOLLOW UP OFFICE NOTE  Beverly [name] [id number]  HISTORY OF PRESENT ILLNESS: Beverly [name] is a 81 year old right-handed woman with history of back and neck pain who follows up for simple partial seizures secondary to subdural hematoma.   She is accompanied by her daughter who provides some history.   UPDATE: She is currently taking Keppra  twice daily.  Last seizure was in October 2014.  She is doing well.  She denies any problems.  HISTORY: In early October 2014, she tripped and fell, hitting her left temple.  She did not experience lose of consciousness or much pain.  About 2 weeks later, she had a couple of episodes where she was talking nonsensical.  There was no loss of consciousness or abnormal movement.  She presented to the ED on 10/18/12.  CT of the head revealed a small left subdural hematoma, extending over the left anterolateral frontal lobes and over the left anterior temporal lobe, measuring 8mm and causing mild mass effect and 3 mm midline shift to the right.  MRI of brain revealed left SDH extending over the convexity with probable subarachnoid blood as well, with 2.5 mm left to right midline shift.  MRA of the head showed mild distal small vessel disease.   EEG performed 10/19/12 was normal.  Neursurgery did not think any emergent surgery was warranted and recommended conservative treatment.  She underwent observation and was started on Keppra  BID as a preventative.  Potassium and sodium were low, which were replaced.  She did well and was clinically and neurologically stable so she was discharged on 10/21/12 with follow up with neurosurgery.  After discharge, she had another episode and her Keppra was increased to  BID.  On 10/30/12, she underwent craniotomy with evacuation as performed by Dr. Venetia Maxon.  She was observed in the ICU and was improving with a drain.  Immediately after removal of the drain, she briefly had an episode of speech arrest, which  resolved.  Keppra was increased to  BID.  She did develop a rash on her upper chest, and it was questioned whether it was secondary to Keppra, but it had improved even when increasing the dose of the Keppra.  She has been doing well and has not had another seizure since the surgery.  Her Keppra was subsequently decreased to  twice daily.  PAST MEDICAL HISTORY: Past Medical History  Diagnosis Date  . Arthritis   . Hearing loss, central     Left ear  . Hypertension   . Anxiety   . COPD (chronic obstructive pulmonary disease)   . Seizures     no seizures, speech issues  . GERD (gastroesophageal reflux disease)   . Headache(784.0)   . Memory changes   . Coronary artery disease     MEDICATIONS: Current Outpatient Prescriptions on File Prior to Visit  Medication Sig Dispense Refill  . ALPRAZolam (XANAX) 0.5 MG tablet Take 0.5 mg by mouth 3 (three) times daily as needed for anxiety.    . celecoxib (CELEBREX) 200 MG capsule Take 200 mg by mouth daily.    . cholecalciferol (VITAMIN D) 1000 UNITS tablet Take 1,000 Units by mouth 2 (two) times daily as needed.     Marland Kitchen Dextromethorphan HBr (TUSSIN COUGH PO) Take 20 mLs by mouth daily as needed (cough).    . fluticasone (FLONASE) 50 MCG/ACT nasal spray Place 1 spray into both nostrils daily.     Marland Kitchen HYDROcodone-acetaminophen (NORCO) 7.5-325 MG per tablet  Take 1 tablet by mouth every 6 (six) hours as needed for pain.     Marland Kitchen levETIRAcetam (KEPPRA) 500 MG tablet TAKE ONE TABLET TWICE DAILY 60 tablet 3  . methocarbamol (ROBAXIN) 750 MG tablet Take 750 mg by mouth 2 (two) times daily.     . metoprolol succinate (TOPROL-XL) 50 MG 24 hr tablet Take 25 mg by mouth daily. Take with or immediately following a meal.    . Multiple Vitamins-Minerals (PRESERVISION/LUTEIN PO) Take 1 tablet by mouth 2 (two) times daily.    . pantoprazole (PROTONIX) 40 MG tablet Take 40 mg by mouth daily.    . potassium chloride (K-DUR) 10 MEQ tablet Take 10 mEq by mouth  daily.     Marland Kitchen Propylene Glycol (SYSTANE BALANCE OP) Place 1-2 drops into both eyes daily as needed (dry eyes).     No current facility-administered medications on file prior to visit.    ALLERGIES: Allergies  Allergen Reactions  . Penicillins Itching    FAMILY HISTORY: Family History  Problem Relation Age of Onset  . Ataxia Neg Hx   . Chorea Neg Hx   . Dementia Neg Hx   . Mental retardation Neg Hx   . Migraines Neg Hx   . Multiple sclerosis Neg Hx   . Neurofibromatosis Neg Hx   . Neuropathy Neg Hx   . Parkinsonism Neg Hx   . Seizures Neg Hx   . Stroke Neg Hx     SOCIAL HISTORY: History   Social History  . Marital Status: Married    Spouse Name: N/A  . Number of Children: N/A  . Years of Education: N/A   Occupational History  . Not on file.   Social History Main Topics  . Smoking status: Never Smoker   . Smokeless tobacco: Never Used  . Alcohol Use: No  . Drug Use: No  . Sexual Activity: Not on file   Other Topics Concern  . Not on file   Social History Narrative    REVIEW OF SYSTEMS: Constitutional: No fevers, chills, or sweats, no generalized fatigue, change in appetite Eyes: No visual changes, double vision, eye pain Ear, nose and throat: No hearing loss, ear pain, nasal congestion, sore throat Cardiovascular: No chest pain, palpitations Respiratory:  No shortness of breath at rest or with exertion, wheezes GastrointestinaI: No nausea, vomiting, diarrhea, abdominal pain, fecal incontinence Genitourinary:  No dysuria, urinary retention or frequency Musculoskeletal:  No neck pain, back pain Integumentary: No rash, pruritus, skin lesions Neurological: as above Psychiatric: No depression, insomnia, anxiety Endocrine: No palpitations, fatigue, diaphoresis, mood swings, change in appetite, change in weight, increased thirst Hematologic/Lymphatic:  No anemia, purpura, petechiae. Allergic/Immunologic: no itchy/runny eyes, nasal congestion, recent allergic  reactions, rashes  PHYSICAL EXAM: Filed Vitals:   06/23/14 1342  BP: 140/84  Pulse: 67  Resp: 16   General: No acute distress Head:  Normocephalic/atraumatic Eyes:  Fundoscopic exam unremarkable without vessel changes, exudates, hemorrhages or papilledema. Neck: supple, no paraspinal tenderness, full range of motion Heart:  Regular rate and rhythm Lungs:  Clear to auscultation bilaterally Back: No paraspinal tenderness Neurological Exam: alert and oriented to person, place, and time. Attention span and concentration intact, recent and remote memory intact, fund of knowledge intact.  Speech fluent and not dysarthric, language intact.  CN II-XII intact. Fundoscopic exam unremarkable without vessel changes, exudates, hemorrhages or papilledema.  Bulk and tone normal, muscle strength 5/5 throughout.  Sensation to light touch, temperature and vibration intact.  Deep tendon reflexes  2+ throughout, toes downgoing.  Finger to nose and heel to shin testing intact.  Gait normal, Romberg negative.  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  Shon Millet, DO  CC:  Butch Penny, MD

## 2014-06-23 NOTE — Patient Instructions (Signed)
Continue Keppra (levetiracetam) 500mg  twice daily Follow up in one year.

## 2014-07-09 DIAGNOSIS — Z0000 Encounter for general adult medical examination without abnormal findings: Secondary | ICD-10-CM | POA: Diagnosis not present

## 2014-07-10 ENCOUNTER — Other Ambulatory Visit: Payer: Self-pay | Admitting: Pulmonary Disease

## 2014-07-10 DIAGNOSIS — M542 Cervicalgia: Secondary | ICD-10-CM

## 2014-07-10 DIAGNOSIS — G8929 Other chronic pain: Secondary | ICD-10-CM

## 2014-07-10 DIAGNOSIS — M545 Low back pain: Secondary | ICD-10-CM

## 2014-07-17 ENCOUNTER — Other Ambulatory Visit: Payer: Self-pay | Admitting: Neurology

## 2014-07-17 DIAGNOSIS — M810 Age-related osteoporosis without current pathological fracture: Secondary | ICD-10-CM | POA: Diagnosis not present

## 2014-07-23 ENCOUNTER — Ambulatory Visit
Admission: RE | Admit: 2014-07-23 | Discharge: 2014-07-23 | Disposition: A | Discharge status: Home / Self Care | Payer: Medicare Other | Source: Ambulatory Visit | Attending: Pulmonary Disease | Admitting: Pulmonary Disease

## 2014-07-23 DIAGNOSIS — M542 Cervicalgia: Secondary | ICD-10-CM

## 2014-07-23 DIAGNOSIS — G8929 Other chronic pain: Secondary | ICD-10-CM

## 2014-07-23 DIAGNOSIS — M545 Low back pain: Secondary | ICD-10-CM

## 2014-07-23 MED ORDER — IOHEXOL 300 MG/ML  SOLN
1.0000 mL | Freq: Once | INTRAMUSCULAR | Status: AC | PRN
  Administered 2014-07-23: 1 mL via EPIDURAL

## 2014-07-23 MED ORDER — TRIAMCINOLONE ACETONIDE 40 MG/ML IJ SUSP (RADIOLOGY)
60.0000 mg | Freq: Once | INTRAMUSCULAR | Status: AC
  Administered 2014-07-23: 60 mg via EPIDURAL

## 2014-08-04 DIAGNOSIS — E538 Deficiency of other specified B group vitamins: Secondary | ICD-10-CM | POA: Diagnosis not present

## 2014-08-11 DIAGNOSIS — E538 Deficiency of other specified B group vitamins: Secondary | ICD-10-CM | POA: Diagnosis not present

## 2014-08-18 DIAGNOSIS — H3531 Nonexudative age-related macular degeneration: Secondary | ICD-10-CM | POA: Diagnosis not present

## 2014-08-19 DIAGNOSIS — E538 Deficiency of other specified B group vitamins: Secondary | ICD-10-CM | POA: Diagnosis not present

## 2014-09-18 DIAGNOSIS — E538 Deficiency of other specified B group vitamins: Secondary | ICD-10-CM | POA: Diagnosis not present

## 2014-12-04 ENCOUNTER — Other Ambulatory Visit: Payer: Self-pay | Admitting: Pulmonary Disease

## 2014-12-04 DIAGNOSIS — M545 Low back pain, unspecified: Secondary | ICD-10-CM

## 2014-12-04 DIAGNOSIS — G8929 Other chronic pain: Secondary | ICD-10-CM

## 2014-12-18 ENCOUNTER — Other Ambulatory Visit: Payer: Medicare Other

## 2015-01-14 DIAGNOSIS — M545 Low back pain: Secondary | ICD-10-CM | POA: Diagnosis not present

## 2015-01-14 DIAGNOSIS — R569 Unspecified convulsions: Secondary | ICD-10-CM | POA: Diagnosis not present

## 2015-01-14 DIAGNOSIS — I10 Essential (primary) hypertension: Secondary | ICD-10-CM | POA: Diagnosis not present

## 2015-02-09 ENCOUNTER — Encounter: Payer: Self-pay | Admitting: Neurology

## 2015-04-14 DIAGNOSIS — M5000 Cervical disc disorder with myelopathy, unspecified cervical region: Secondary | ICD-10-CM | POA: Diagnosis not present

## 2015-04-14 DIAGNOSIS — I10 Essential (primary) hypertension: Secondary | ICD-10-CM | POA: Diagnosis not present

## 2015-04-14 DIAGNOSIS — K210 Gastro-esophageal reflux disease with esophagitis: Secondary | ICD-10-CM | POA: Diagnosis not present

## 2015-04-14 DIAGNOSIS — M545 Low back pain: Secondary | ICD-10-CM | POA: Diagnosis not present

## 2015-05-05 ENCOUNTER — Ambulatory Visit
Admission: RE | Admit: 2015-05-05 | Discharge: 2015-05-05 | Disposition: A | Discharge status: Home / Self Care | Payer: Medicare Other | Source: Ambulatory Visit | Attending: Pulmonary Disease | Admitting: Pulmonary Disease

## 2015-05-05 DIAGNOSIS — G8929 Other chronic pain: Secondary | ICD-10-CM

## 2015-05-05 DIAGNOSIS — M545 Low back pain: Principal | ICD-10-CM

## 2015-05-05 DIAGNOSIS — M47817 Spondylosis without myelopathy or radiculopathy, lumbosacral region: Secondary | ICD-10-CM | POA: Diagnosis not present

## 2015-05-05 MED ORDER — METHYLPREDNISOLONE ACETATE 40 MG/ML INJ SUSP (RADIOLOG
120.0000 mg | Freq: Once | INTRAMUSCULAR | Status: AC
  Administered 2015-05-05: 120 mg via EPIDURAL

## 2015-05-05 MED ORDER — IOPAMIDOL (ISOVUE-M 200) INJECTION 41%
1.0000 mL | Freq: Once | INTRAMUSCULAR | Status: AC
  Administered 2015-05-05: 1 mL via EPIDURAL

## 2015-05-29 ENCOUNTER — Other Ambulatory Visit: Payer: Self-pay | Admitting: Pulmonary Disease

## 2015-05-29 DIAGNOSIS — G8929 Other chronic pain: Secondary | ICD-10-CM

## 2015-05-29 DIAGNOSIS — M545 Low back pain: Principal | ICD-10-CM

## 2015-06-09 ENCOUNTER — Ambulatory Visit
Admission: RE | Admit: 2015-06-09 | Discharge: 2015-06-09 | Disposition: A | Discharge status: Home / Self Care | Payer: Medicare Other | Source: Ambulatory Visit | Attending: Pulmonary Disease | Admitting: Pulmonary Disease

## 2015-06-09 DIAGNOSIS — G8929 Other chronic pain: Secondary | ICD-10-CM

## 2015-06-09 DIAGNOSIS — M5030 Other cervical disc degeneration, unspecified cervical region: Secondary | ICD-10-CM | POA: Diagnosis not present

## 2015-06-09 DIAGNOSIS — M545 Low back pain: Principal | ICD-10-CM

## 2015-06-09 MED ORDER — TRIAMCINOLONE ACETONIDE 40 MG/ML IJ SUSP (RADIOLOGY)
60.0000 mg | Freq: Once | INTRAMUSCULAR | Status: AC
  Administered 2015-06-09: 60 mg via EPIDURAL

## 2015-06-09 MED ORDER — IOPAMIDOL (ISOVUE-300) INJECTION 61%
1.0000 mL | Freq: Once | INTRAVENOUS | Status: AC | PRN
  Administered 2015-06-09: 1 mL

## 2015-06-09 NOTE — Discharge Instructions (Signed)

## 2015-06-15 ENCOUNTER — Other Ambulatory Visit: Payer: Medicare Other

## 2015-06-23 ENCOUNTER — Other Ambulatory Visit: Payer: Self-pay | Admitting: Pulmonary Disease

## 2015-06-23 DIAGNOSIS — M545 Low back pain, unspecified: Secondary | ICD-10-CM

## 2015-06-23 DIAGNOSIS — G8929 Other chronic pain: Secondary | ICD-10-CM

## 2015-06-26 ENCOUNTER — Ambulatory Visit
Admission: RE | Admit: 2015-06-26 | Discharge: 2015-06-26 | Disposition: A | Discharge status: Home / Self Care | Payer: Medicare Other | Source: Ambulatory Visit | Attending: Pulmonary Disease | Admitting: Pulmonary Disease

## 2015-06-26 DIAGNOSIS — M545 Low back pain: Principal | ICD-10-CM

## 2015-06-26 DIAGNOSIS — M47812 Spondylosis without myelopathy or radiculopathy, cervical region: Secondary | ICD-10-CM | POA: Diagnosis not present

## 2015-06-26 DIAGNOSIS — G8929 Other chronic pain: Secondary | ICD-10-CM

## 2015-06-26 MED ORDER — IOPAMIDOL (ISOVUE-M 300) INJECTION 61%
1.0000 mL | Freq: Once | INTRAMUSCULAR | Status: AC | PRN
  Administered 2015-06-26: 1 mL via EPIDURAL

## 2015-06-26 MED ORDER — TRIAMCINOLONE ACETONIDE 40 MG/ML IJ SUSP (RADIOLOGY)
60.0000 mg | Freq: Once | INTRAMUSCULAR | Status: AC
  Administered 2015-06-26: 60 mg via EPIDURAL

## 2015-06-26 NOTE — Discharge Instructions (Signed)

## 2015-06-29 ENCOUNTER — Ambulatory Visit: Payer: Medicare Other | Admitting: Neurology

## 2015-07-15 DIAGNOSIS — H9190 Unspecified hearing loss, unspecified ear: Secondary | ICD-10-CM | POA: Diagnosis not present

## 2015-07-15 DIAGNOSIS — I10 Essential (primary) hypertension: Secondary | ICD-10-CM | POA: Diagnosis not present

## 2015-07-15 DIAGNOSIS — M545 Low back pain: Secondary | ICD-10-CM | POA: Diagnosis not present

## 2015-07-15 DIAGNOSIS — E785 Hyperlipidemia, unspecified: Secondary | ICD-10-CM | POA: Diagnosis not present

## 2015-07-29 DIAGNOSIS — M545 Low back pain: Secondary | ICD-10-CM | POA: Diagnosis not present

## 2015-07-29 DIAGNOSIS — E538 Deficiency of other specified B group vitamins: Secondary | ICD-10-CM | POA: Diagnosis not present

## 2015-07-29 DIAGNOSIS — I10 Essential (primary) hypertension: Secondary | ICD-10-CM | POA: Diagnosis not present

## 2015-07-29 DIAGNOSIS — M5000 Cervical disc disorder with myelopathy, unspecified cervical region: Secondary | ICD-10-CM | POA: Diagnosis not present

## 2015-07-29 DIAGNOSIS — E785 Hyperlipidemia, unspecified: Secondary | ICD-10-CM | POA: Diagnosis not present

## 2015-08-01 ENCOUNTER — Other Ambulatory Visit: Payer: Self-pay | Admitting: Neurology

## 2015-09-09 ENCOUNTER — Encounter (HOSPITAL_COMMUNITY): Payer: Self-pay | Admitting: *Deleted

## 2015-09-09 ENCOUNTER — Emergency Department (HOSPITAL_COMMUNITY)
Admission: EM | Admit: 2015-09-09 | Discharge: 2015-09-09 | Disposition: A | Discharge status: Home / Self Care | Payer: Medicare Other | Attending: Emergency Medicine | Admitting: Emergency Medicine

## 2015-09-09 ENCOUNTER — Emergency Department (HOSPITAL_COMMUNITY): Payer: Medicare Other

## 2015-09-09 DIAGNOSIS — Y92009 Unspecified place in unspecified non-institutional (private) residence as the place of occurrence of the external cause: Secondary | ICD-10-CM | POA: Insufficient documentation

## 2015-09-09 DIAGNOSIS — I10 Essential (primary) hypertension: Secondary | ICD-10-CM | POA: Diagnosis not present

## 2015-09-09 DIAGNOSIS — S060X0A Concussion without loss of consciousness, initial encounter: Secondary | ICD-10-CM | POA: Insufficient documentation

## 2015-09-09 DIAGNOSIS — S0101XA Laceration without foreign body of scalp, initial encounter: Secondary | ICD-10-CM | POA: Diagnosis not present

## 2015-09-09 DIAGNOSIS — W208XXA Other cause of strike by thrown, projected or falling object, initial encounter: Secondary | ICD-10-CM | POA: Diagnosis not present

## 2015-09-09 DIAGNOSIS — Z23 Encounter for immunization: Secondary | ICD-10-CM | POA: Insufficient documentation

## 2015-09-09 DIAGNOSIS — Y999 Unspecified external cause status: Secondary | ICD-10-CM | POA: Insufficient documentation

## 2015-09-09 DIAGNOSIS — Y939 Activity, unspecified: Secondary | ICD-10-CM | POA: Diagnosis not present

## 2015-09-09 DIAGNOSIS — I2510 Atherosclerotic heart disease of native coronary artery without angina pectoris: Secondary | ICD-10-CM | POA: Insufficient documentation

## 2015-09-09 DIAGNOSIS — Z79899 Other long term (current) drug therapy: Secondary | ICD-10-CM | POA: Diagnosis not present

## 2015-09-09 DIAGNOSIS — S0990XA Unspecified injury of head, initial encounter: Secondary | ICD-10-CM | POA: Diagnosis not present

## 2015-09-09 DIAGNOSIS — J449 Chronic obstructive pulmonary disease, unspecified: Secondary | ICD-10-CM | POA: Insufficient documentation

## 2015-09-09 HISTORY — DX: Other injury of unspecified body region, initial encounter: T14.8XXA

## 2015-09-09 MED ORDER — TETANUS-DIPHTH-ACELL PERTUSSIS 5-2.5-18.5 LF-MCG/0.5 IM SUSP
0.5000 mL | Freq: Once | INTRAMUSCULAR | Status: AC
  Administered 2015-09-09: 0.5 mL via INTRAMUSCULAR
  Filled 2015-09-09: qty 0.5

## 2015-09-09 NOTE — ED Triage Notes (Signed)
Pt has a wooden iron broad built into the wall at home and it had came down on pt's head. Small lac to scalp. Pt denies LOC, denies blood thinners and denies pain. Last tetanus 2008

## 2015-09-09 NOTE — ED Provider Notes (Signed)
[facility]-EM[facility]ENCY DEPT Provider Note   CSN: [id number] Arrival date & time: 09/09/15  1536     History   Chief Complaint Chief Complaint  Patient presents with  . Head Laceration    HPI Beverly [name] is a 82 y.o. female.  The history is provided by the patient and a relative.  Head Laceration  This is a new problem. The current episode started 3 to 5 hours ago. The problem has not changed since onset.Pertinent negatives include no chest pain, no abdominal pain and no headaches. Nothing aggravates the symptoms. Nothing relieves the symptoms.  pt reports an ironing board fell and hit her head No LOC No vomiting No significant headache No other complaints  She is not on anticoagulation  Past Medical History:  Diagnosis Date  . Anxiety   . Arthritis   . COPD (chronic obstructive pulmonary disease) (HCC)   . Coronary artery disease   . GERD (gastroesophageal reflux disease)   . Headache(784.0)   . Hearing loss, central    Left ear  . Hematoma   . Hypertension   . Memory changes   . Seizures (HCC) 10/2012   no seizures, speech issues; after a fall, hitting head on left side    Patient Active Problem List   Diagnosis Date Noted  . 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 History of traumatic subdural hematoma 06/23/2014  . Subdural hematoma (HCC) 10/18/2012  . TIA (transient ischemic attack) 10/18/2012  . HTN (hypertension) 10/18/2012  . Hyponatremia 10/18/2012    Past Surgical History:  Procedure Laterality Date  . ABDOMINAL HYSTERECTOMY    . APPENDECTOMY    . CARDIAC CATHETERIZATION     15 yrs ago  . CRANIOTOMY Left 10/30/2012   Procedure: CRANIOTOMY HEMATOMA EVACUATION SUBDURAL;  Surgeon: Maeola Harman, MD;  Location: [facility];  Service: Neurosurgery;  Laterality: Left;  Left Craniotomy for evacuation of subdural hematoma  . JOINT  REPLACEMENT Bilateral    knees    OB History    No data available       Home Medications    Prior to Admission medications   Medication Sig Start Date End Date Taking? Authorizing Provider  ALPRAZolam Prudy Feeler) 0.5 MG tablet Take 0.5 mg by mouth 3 (three) times daily as needed for anxiety.    Historical Provider, MD  celecoxib (CELEBREX) 200 MG capsule Take 200 mg by mouth daily.    Historical Provider, MD  cholecalciferol (VITAMIN D) 1000 UNITS tablet Take 1,000 Units by mouth 2 (two) times daily as needed.     Historical Provider, MD  fluticasone (FLONASE) 50 MCG/ACT nasal spray Place 1 spray into both nostrils daily.  09/18/12   Historical Provider, MD  HYDROcodone-acetaminophen (NORCO) 7.5-325 MG per tablet Take 1 tablet by mouth every 6 (six) hours as needed for pain.     Historical Provider, MD  levETIRAcetam (KEPPRA) 500 MG tablet TAKE ONE TABLET TWICE DAILY 08/03/15   Drema Dallas, DO  methocarbamol (ROBAXIN) 750 MG tablet Take 750 mg by mouth 2 (two) times daily.     Historical Provider, MD  metoprolol succinate (TOPROL-XL) 50 MG 24 hr tablet Take 25 mg by mouth daily. Take with or immediately following a meal.    Historical Provider, MD  Multiple Vitamins-Minerals (PRESERVISION/LUTEIN PO) Take 1 tablet by mouth 2 (two) times daily.    Historical Provider, MD  pantoprazole (PROTONIX) 40 MG tablet Take 40 mg  by mouth daily.    Historical Provider, MD  potassium chloride (K-DUR) 10 MEQ tablet Take 10 mEq by mouth daily.     Historical Provider, MD  Propylene Glycol (SYSTANE BALANCE OP) Place 1-2 drops into both eyes daily as needed (dry eyes).    Historical Provider, MD    Family History Family History  Problem Relation Age of Onset  . Ataxia Neg Hx   . Chorea Neg Hx   . Dementia Neg Hx   . Mental retardation Neg Hx   . Migraines Neg Hx   . Multiple sclerosis Neg Hx   . Neurofibromatosis Neg Hx   . Neuropathy Neg Hx   . Parkinsonism Neg Hx   . Seizures Neg Hx   . Stroke  Neg Hx     Social History Social History  Substance Use Topics  . Smoking status: Never Smoker  . Smokeless tobacco: Never Used  . Alcohol use No     Allergies   Penicillins   Review of Systems Review of Systems  Constitutional: Negative for fever.  Cardiovascular: Negative for chest pain.  Gastrointestinal: Negative for abdominal pain.  Skin: Positive for wound.  Neurological: Negative for dizziness and headaches.  All other systems reviewed and are negative.    Physical Exam Updated Vital Signs BP (!) 206/93 (BP Location: Left Arm)   Pulse 84   Temp 98.1 F (36.7 C) (Oral)   Resp 18   Ht 5\' 1"  (1.549 m)   Wt 62.1 kg   SpO2 99%   BMI 25.89 kg/m   Physical Exam CONSTITUTIONAL: Well developed/well nourished HEAD: small laceration to posterior scalp, no active bleeding, no other signs of trauma EYES: EOMI ENMT: Mucous membranes moist NECK: supple no meningeal signs CV: S1/S2 noted, no murmurs/rubs/gallops noted LUNGS: Lungs are clear to auscultation bilaterally, no apparent distress ABDOMEN: soft, nontender NEURO: Pt is awake/alert/appropriate, moves all extremitiesx4.  No facial droop.   EXTREMITIES: pulses normal/equal, full ROM SKIN: warm, color normal PSYCH: no abnormalities of mood noted, alert and oriented to situation   ED Treatments / Results  Labs (all labs ordered are listed, but only abnormal results are displayed) Labs Reviewed - No data to display  EKG  EKG Interpretation None       Radiology Ct Head Wo Contrast  Result Date: 09/09/2015 CLINICAL DATA:  Heavy object fell on patient's head today. EXAM: CT HEAD WITHOUT CONTRAST TECHNIQUE: Contiguous axial images were obtained from the base of the skull through the vertex without intravenous contrast. COMPARISON:  12/03/2012 FINDINGS: Brain: Stable age related cerebral atrophy, ventriculomegaly and periventricular white matter disease. No extra-axial fluid collections are identified. No CT  findings for acute hemispheric infarction or intracranial hemorrhage. No mass lesions. The brainstem and cerebellum are normal. Vascular: Stable minimal vascular calcifications for age. No definite aneurysm. Skull: No acute skull fracture. Evidence of prior left-sided craniotomy. Sinuses/Orbits: The visualized paranasal sinuses and mastoid air cells are clear. The globes are intact. Other: None IMPRESSION: Age related cerebral atrophy, ventriculomegaly and periventricular white matter disease, unchanged since prior study. Remote left parietal craniotomy changes. No acute intracranial findings or skull fracture. Electronically Signed   By: Rudie MeyerP.  Gallerani M.D.   On: 09/09/2015 17:14    Procedures Procedures (including critical care time)  Medications Ordered in ED Medications  Tdap (BOOSTRIX) injection 0.5 mL (not administered)     Initial Impression / Assessment and Plan / ED Course  I have reviewed the triage vital signs and the nursing notes.  Clinical Course    Pt with previous h/o subdural hematoma requiring craniotomy Pt requesting CT head Wound not amenable to repair   5:25 PM Ct head negative Pt awake/alert, no distress Will d/c home We discussed return precautions  Final Clinical Impressions(s) / ED Diagnoses   Final diagnoses:  Scalp laceration, initial encounter  Concussion, without loss of consciousness, initial encounter    New Prescriptions New Prescriptions   No medications on file     Zadie Rhine, MD 09/09/15 1726

## 2015-09-16 DIAGNOSIS — K210 Gastro-esophageal reflux disease with esophagitis: Secondary | ICD-10-CM | POA: Diagnosis not present

## 2015-09-16 DIAGNOSIS — M545 Low back pain: Secondary | ICD-10-CM | POA: Diagnosis not present

## 2015-09-16 DIAGNOSIS — R569 Unspecified convulsions: Secondary | ICD-10-CM | POA: Diagnosis not present

## 2015-09-16 DIAGNOSIS — I10 Essential (primary) hypertension: Secondary | ICD-10-CM | POA: Diagnosis not present

## 2015-09-17 ENCOUNTER — Other Ambulatory Visit (HOSPITAL_COMMUNITY): Payer: Self-pay | Admitting: Pulmonary Disease

## 2015-09-17 ENCOUNTER — Other Ambulatory Visit: Payer: Self-pay | Admitting: Pulmonary Disease

## 2015-09-17 DIAGNOSIS — M79605 Pain in left leg: Secondary | ICD-10-CM

## 2015-09-25 ENCOUNTER — Ambulatory Visit
Admission: RE | Admit: 2015-09-25 | Discharge: 2015-09-25 | Disposition: A | Discharge status: Home / Self Care | Payer: Medicare Other | Source: Ambulatory Visit | Attending: Pulmonary Disease | Admitting: Pulmonary Disease

## 2015-09-25 ENCOUNTER — Other Ambulatory Visit: Payer: Self-pay | Admitting: Pulmonary Disease

## 2015-09-25 DIAGNOSIS — M79605 Pain in left leg: Secondary | ICD-10-CM

## 2015-09-25 DIAGNOSIS — M545 Low back pain: Secondary | ICD-10-CM

## 2015-09-25 DIAGNOSIS — M4806 Spinal stenosis, lumbar region: Secondary | ICD-10-CM | POA: Diagnosis not present

## 2015-12-08 ENCOUNTER — Other Ambulatory Visit: Payer: Self-pay | Admitting: Neurology

## 2016-05-04 ENCOUNTER — Other Ambulatory Visit: Payer: Self-pay | Admitting: Pulmonary Disease

## 2016-05-04 DIAGNOSIS — G8929 Other chronic pain: Secondary | ICD-10-CM

## 2016-05-04 DIAGNOSIS — M545 Low back pain: Principal | ICD-10-CM

## 2016-05-09 ENCOUNTER — Other Ambulatory Visit: Payer: Medicare Other

## 2016-05-24 ENCOUNTER — Ambulatory Visit
Admission: RE | Admit: 2016-05-24 | Discharge: 2016-05-24 | Disposition: A | Discharge status: Home / Self Care | Payer: Medicare Other | Source: Ambulatory Visit | Attending: Pulmonary Disease | Admitting: Pulmonary Disease

## 2016-05-24 DIAGNOSIS — M47817 Spondylosis without myelopathy or radiculopathy, lumbosacral region: Secondary | ICD-10-CM | POA: Diagnosis not present

## 2016-05-24 DIAGNOSIS — G8929 Other chronic pain: Secondary | ICD-10-CM

## 2016-05-24 DIAGNOSIS — M545 Low back pain: Principal | ICD-10-CM

## 2016-05-24 MED ORDER — IOPAMIDOL (ISOVUE-M 200) INJECTION 41%
1.0000 mL | Freq: Once | INTRAMUSCULAR | Status: AC
  Administered 2016-05-24: 1 mL via EPIDURAL

## 2016-05-24 MED ORDER — METHYLPREDNISOLONE ACETATE 40 MG/ML INJ SUSP (RADIOLOG
120.0000 mg | Freq: Once | INTRAMUSCULAR | Status: AC
  Administered 2016-05-24: 120 mg via EPIDURAL

## 2016-06-30 ENCOUNTER — Other Ambulatory Visit (HOSPITAL_COMMUNITY): Payer: Self-pay | Admitting: Pulmonary Disease

## 2016-06-30 DIAGNOSIS — M25511 Pain in right shoulder: Secondary | ICD-10-CM | POA: Diagnosis not present

## 2016-06-30 DIAGNOSIS — I10 Essential (primary) hypertension: Secondary | ICD-10-CM | POA: Diagnosis not present

## 2016-06-30 DIAGNOSIS — M810 Age-related osteoporosis without current pathological fracture: Secondary | ICD-10-CM | POA: Diagnosis not present

## 2016-06-30 DIAGNOSIS — M545 Low back pain: Secondary | ICD-10-CM | POA: Diagnosis not present

## 2016-06-30 DIAGNOSIS — Z780 Asymptomatic menopausal state: Secondary | ICD-10-CM

## 2016-07-01 ENCOUNTER — Other Ambulatory Visit: Payer: Self-pay | Admitting: Pulmonary Disease

## 2016-07-01 DIAGNOSIS — M545 Low back pain: Principal | ICD-10-CM

## 2016-07-01 DIAGNOSIS — G8929 Other chronic pain: Secondary | ICD-10-CM

## 2016-07-07 ENCOUNTER — Ambulatory Visit (HOSPITAL_COMMUNITY)
Admission: RE | Admit: 2016-07-07 | Discharge: 2016-07-07 | Disposition: A | Discharge status: Home / Self Care | Payer: Medicare Other | Source: Ambulatory Visit | Attending: Pulmonary Disease | Admitting: Pulmonary Disease

## 2016-07-07 DIAGNOSIS — Z780 Asymptomatic menopausal state: Secondary | ICD-10-CM

## 2016-07-07 DIAGNOSIS — M810 Age-related osteoporosis without current pathological fracture: Secondary | ICD-10-CM | POA: Insufficient documentation

## 2016-07-07 DIAGNOSIS — N951 Menopausal and female climacteric states: Secondary | ICD-10-CM | POA: Diagnosis not present

## 2016-07-28 DIAGNOSIS — I10 Essential (primary) hypertension: Secondary | ICD-10-CM | POA: Diagnosis not present

## 2016-07-28 DIAGNOSIS — M810 Age-related osteoporosis without current pathological fracture: Secondary | ICD-10-CM | POA: Diagnosis not present

## 2016-07-28 DIAGNOSIS — M545 Low back pain: Secondary | ICD-10-CM | POA: Diagnosis not present

## 2016-07-28 DIAGNOSIS — K210 Gastro-esophageal reflux disease with esophagitis: Secondary | ICD-10-CM | POA: Diagnosis not present

## 2016-08-05 ENCOUNTER — Ambulatory Visit
Admission: RE | Admit: 2016-08-05 | Discharge: 2016-08-05 | Disposition: A | Discharge status: Home / Self Care | Payer: Medicare Other | Source: Ambulatory Visit | Attending: Pulmonary Disease | Admitting: Pulmonary Disease

## 2016-08-05 DIAGNOSIS — M545 Low back pain: Principal | ICD-10-CM

## 2016-08-05 DIAGNOSIS — G8929 Other chronic pain: Secondary | ICD-10-CM

## 2016-08-05 MED ORDER — IOPAMIDOL (ISOVUE-M 200) INJECTION 41%
1.0000 mL | Freq: Once | INTRAMUSCULAR | Status: AC
  Administered 2016-08-05: 1 mL via EPIDURAL

## 2016-08-05 MED ORDER — METHYLPREDNISOLONE ACETATE 40 MG/ML INJ SUSP (RADIOLOG
120.0000 mg | Freq: Once | INTRAMUSCULAR | Status: AC
  Administered 2016-08-05: 120 mg via EPIDURAL

## 2016-10-06 DIAGNOSIS — H353132 Nonexudative age-related macular degeneration, bilateral, intermediate dry stage: Secondary | ICD-10-CM | POA: Diagnosis not present

## 2016-10-06 DIAGNOSIS — H353212 Exudative age-related macular degeneration, right eye, with inactive choroidal neovascularization: Secondary | ICD-10-CM | POA: Diagnosis not present

## 2016-10-06 DIAGNOSIS — H35371 Puckering of macula, right eye: Secondary | ICD-10-CM | POA: Diagnosis not present

## 2016-10-06 DIAGNOSIS — H353211 Exudative age-related macular degeneration, right eye, with active choroidal neovascularization: Secondary | ICD-10-CM | POA: Diagnosis not present

## 2016-10-06 DIAGNOSIS — H353221 Exudative age-related macular degeneration, left eye, with active choroidal neovascularization: Secondary | ICD-10-CM | POA: Diagnosis not present

## 2016-10-10 DIAGNOSIS — H353211 Exudative age-related macular degeneration, right eye, with active choroidal neovascularization: Secondary | ICD-10-CM | POA: Diagnosis not present

## 2016-10-17 DIAGNOSIS — M542 Cervicalgia: Secondary | ICD-10-CM | POA: Diagnosis not present

## 2016-10-17 DIAGNOSIS — K210 Gastro-esophageal reflux disease with esophagitis: Secondary | ICD-10-CM | POA: Diagnosis not present

## 2016-10-17 DIAGNOSIS — M545 Low back pain: Secondary | ICD-10-CM | POA: Diagnosis not present

## 2016-10-17 DIAGNOSIS — M25511 Pain in right shoulder: Secondary | ICD-10-CM | POA: Diagnosis not present

## 2016-10-20 DIAGNOSIS — L820 Inflamed seborrheic keratosis: Secondary | ICD-10-CM | POA: Diagnosis not present

## 2016-10-20 DIAGNOSIS — L304 Erythema intertrigo: Secondary | ICD-10-CM | POA: Diagnosis not present

## 2016-10-20 DIAGNOSIS — B078 Other viral warts: Secondary | ICD-10-CM | POA: Diagnosis not present

## 2016-10-31 ENCOUNTER — Ambulatory Visit (HOSPITAL_COMMUNITY)
Admission: RE | Admit: 2016-10-31 | Discharge: 2016-10-31 | Disposition: A | Discharge status: Home / Self Care | Payer: Medicare Other | Source: Ambulatory Visit | Attending: Pulmonary Disease | Admitting: Pulmonary Disease

## 2016-10-31 ENCOUNTER — Other Ambulatory Visit (HOSPITAL_COMMUNITY): Payer: Self-pay | Admitting: Pulmonary Disease

## 2016-10-31 DIAGNOSIS — R52 Pain, unspecified: Secondary | ICD-10-CM | POA: Insufficient documentation

## 2016-10-31 DIAGNOSIS — M19011 Primary osteoarthritis, right shoulder: Secondary | ICD-10-CM | POA: Diagnosis not present

## 2016-10-31 DIAGNOSIS — M75101 Unspecified rotator cuff tear or rupture of right shoulder, not specified as traumatic: Secondary | ICD-10-CM | POA: Diagnosis not present

## 2016-10-31 DIAGNOSIS — M25511 Pain in right shoulder: Secondary | ICD-10-CM | POA: Diagnosis not present

## 2016-10-31 DIAGNOSIS — S4991XA Unspecified injury of right shoulder and upper arm, initial encounter: Secondary | ICD-10-CM | POA: Diagnosis not present

## 2016-10-31 DIAGNOSIS — M810 Age-related osteoporosis without current pathological fracture: Secondary | ICD-10-CM | POA: Insufficient documentation

## 2016-10-31 DIAGNOSIS — M545 Low back pain: Secondary | ICD-10-CM | POA: Diagnosis not present

## 2016-11-07 DIAGNOSIS — H353221 Exudative age-related macular degeneration, left eye, with active choroidal neovascularization: Secondary | ICD-10-CM | POA: Diagnosis not present

## 2016-11-07 DIAGNOSIS — H35371 Puckering of macula, right eye: Secondary | ICD-10-CM | POA: Diagnosis not present

## 2016-11-07 DIAGNOSIS — H353132 Nonexudative age-related macular degeneration, bilateral, intermediate dry stage: Secondary | ICD-10-CM | POA: Diagnosis not present

## 2016-11-07 DIAGNOSIS — H353211 Exudative age-related macular degeneration, right eye, with active choroidal neovascularization: Secondary | ICD-10-CM | POA: Diagnosis not present

## 2016-11-07 DIAGNOSIS — M545 Low back pain: Secondary | ICD-10-CM | POA: Diagnosis not present

## 2016-11-07 DIAGNOSIS — I10 Essential (primary) hypertension: Secondary | ICD-10-CM | POA: Diagnosis not present

## 2016-11-07 DIAGNOSIS — I700 Atherosclerosis of aorta: Secondary | ICD-10-CM | POA: Diagnosis not present

## 2016-11-07 DIAGNOSIS — M542 Cervicalgia: Secondary | ICD-10-CM | POA: Diagnosis not present

## 2016-11-14 DIAGNOSIS — H353211 Exudative age-related macular degeneration, right eye, with active choroidal neovascularization: Secondary | ICD-10-CM | POA: Diagnosis not present

## 2016-11-16 ENCOUNTER — Other Ambulatory Visit: Payer: Self-pay | Admitting: Pulmonary Disease

## 2016-11-16 DIAGNOSIS — M542 Cervicalgia: Secondary | ICD-10-CM

## 2016-12-09 ENCOUNTER — Ambulatory Visit
Admission: RE | Admit: 2016-12-09 | Discharge: 2016-12-09 | Disposition: A | Discharge status: Home / Self Care | Payer: Medicare Other | Source: Ambulatory Visit | Attending: Pulmonary Disease | Admitting: Pulmonary Disease

## 2016-12-09 DIAGNOSIS — M47812 Spondylosis without myelopathy or radiculopathy, cervical region: Secondary | ICD-10-CM | POA: Diagnosis not present

## 2016-12-09 DIAGNOSIS — M542 Cervicalgia: Secondary | ICD-10-CM

## 2016-12-09 MED ORDER — IOPAMIDOL (ISOVUE-M 300) INJECTION 61%
1.0000 mL | Freq: Once | INTRAMUSCULAR | Status: AC | PRN
  Administered 2016-12-09: 1 mL via EPIDURAL

## 2016-12-09 MED ORDER — TRIAMCINOLONE ACETONIDE 40 MG/ML IJ SUSP (RADIOLOGY)
60.0000 mg | Freq: Once | INTRAMUSCULAR | Status: AC
  Administered 2016-12-09: 60 mg via EPIDURAL

## 2016-12-09 NOTE — Discharge Instructions (Signed)

## 2016-12-15 DIAGNOSIS — H35371 Puckering of macula, right eye: Secondary | ICD-10-CM | POA: Diagnosis not present

## 2016-12-15 DIAGNOSIS — H353211 Exudative age-related macular degeneration, right eye, with active choroidal neovascularization: Secondary | ICD-10-CM | POA: Diagnosis not present

## 2016-12-15 DIAGNOSIS — H353221 Exudative age-related macular degeneration, left eye, with active choroidal neovascularization: Secondary | ICD-10-CM | POA: Diagnosis not present

## 2016-12-15 DIAGNOSIS — H353132 Nonexudative age-related macular degeneration, bilateral, intermediate dry stage: Secondary | ICD-10-CM | POA: Diagnosis not present

## 2016-12-19 DIAGNOSIS — H353132 Nonexudative age-related macular degeneration, bilateral, intermediate dry stage: Secondary | ICD-10-CM | POA: Diagnosis not present

## 2016-12-19 DIAGNOSIS — H353221 Exudative age-related macular degeneration, left eye, with active choroidal neovascularization: Secondary | ICD-10-CM | POA: Diagnosis not present

## 2016-12-19 DIAGNOSIS — H353211 Exudative age-related macular degeneration, right eye, with active choroidal neovascularization: Secondary | ICD-10-CM | POA: Diagnosis not present

## 2016-12-19 DIAGNOSIS — H35371 Puckering of macula, right eye: Secondary | ICD-10-CM | POA: Diagnosis not present

## 2017-01-23 DIAGNOSIS — H353132 Nonexudative age-related macular degeneration, bilateral, intermediate dry stage: Secondary | ICD-10-CM | POA: Diagnosis not present

## 2017-01-23 DIAGNOSIS — H353211 Exudative age-related macular degeneration, right eye, with active choroidal neovascularization: Secondary | ICD-10-CM | POA: Diagnosis not present

## 2017-01-23 DIAGNOSIS — H35371 Puckering of macula, right eye: Secondary | ICD-10-CM | POA: Diagnosis not present

## 2017-01-23 DIAGNOSIS — H353221 Exudative age-related macular degeneration, left eye, with active choroidal neovascularization: Secondary | ICD-10-CM | POA: Diagnosis not present

## 2017-01-26 ENCOUNTER — Other Ambulatory Visit: Payer: Self-pay | Admitting: Pulmonary Disease

## 2017-01-26 DIAGNOSIS — M542 Cervicalgia: Secondary | ICD-10-CM

## 2017-01-30 DIAGNOSIS — H353211 Exudative age-related macular degeneration, right eye, with active choroidal neovascularization: Secondary | ICD-10-CM | POA: Diagnosis not present

## 2017-01-30 DIAGNOSIS — H353132 Nonexudative age-related macular degeneration, bilateral, intermediate dry stage: Secondary | ICD-10-CM | POA: Diagnosis not present

## 2017-01-30 DIAGNOSIS — H353221 Exudative age-related macular degeneration, left eye, with active choroidal neovascularization: Secondary | ICD-10-CM | POA: Diagnosis not present

## 2017-01-30 DIAGNOSIS — H35371 Puckering of macula, right eye: Secondary | ICD-10-CM | POA: Diagnosis not present

## 2017-02-07 ENCOUNTER — Ambulatory Visit
Admission: RE | Admit: 2017-02-07 | Discharge: 2017-02-07 | Disposition: A | Discharge status: Home / Self Care | Payer: Medicare Other | Source: Ambulatory Visit | Attending: Pulmonary Disease | Admitting: Pulmonary Disease

## 2017-02-07 DIAGNOSIS — M542 Cervicalgia: Secondary | ICD-10-CM

## 2017-02-07 DIAGNOSIS — M47812 Spondylosis without myelopathy or radiculopathy, cervical region: Secondary | ICD-10-CM | POA: Diagnosis not present

## 2017-02-07 MED ORDER — TRIAMCINOLONE ACETONIDE 40 MG/ML IJ SUSP (RADIOLOGY)
60.0000 mg | Freq: Once | INTRAMUSCULAR | Status: AC
  Administered 2017-02-07: 60 mg via EPIDURAL

## 2017-02-07 MED ORDER — IOPAMIDOL (ISOVUE-M 300) INJECTION 61%
1.0000 mL | Freq: Once | INTRAMUSCULAR | Status: AC | PRN
  Administered 2017-02-07: 1 mL via EPIDURAL

## 2017-02-27 DIAGNOSIS — M545 Low back pain: Secondary | ICD-10-CM | POA: Diagnosis not present

## 2017-02-27 DIAGNOSIS — M542 Cervicalgia: Secondary | ICD-10-CM | POA: Diagnosis not present

## 2017-03-06 DIAGNOSIS — H353132 Nonexudative age-related macular degeneration, bilateral, intermediate dry stage: Secondary | ICD-10-CM | POA: Diagnosis not present

## 2017-03-06 DIAGNOSIS — H35371 Puckering of macula, right eye: Secondary | ICD-10-CM | POA: Diagnosis not present

## 2017-03-06 DIAGNOSIS — H353221 Exudative age-related macular degeneration, left eye, with active choroidal neovascularization: Secondary | ICD-10-CM | POA: Diagnosis not present

## 2017-03-06 DIAGNOSIS — H353211 Exudative age-related macular degeneration, right eye, with active choroidal neovascularization: Secondary | ICD-10-CM | POA: Diagnosis not present

## 2017-03-08 DIAGNOSIS — M545 Low back pain: Secondary | ICD-10-CM | POA: Diagnosis not present

## 2017-03-08 DIAGNOSIS — M542 Cervicalgia: Secondary | ICD-10-CM | POA: Diagnosis not present

## 2017-03-20 DIAGNOSIS — H35361 Drusen (degenerative) of macula, right eye: Secondary | ICD-10-CM | POA: Diagnosis not present

## 2017-03-20 DIAGNOSIS — H353211 Exudative age-related macular degeneration, right eye, with active choroidal neovascularization: Secondary | ICD-10-CM | POA: Diagnosis not present

## 2017-03-20 DIAGNOSIS — H35371 Puckering of macula, right eye: Secondary | ICD-10-CM | POA: Diagnosis not present

## 2017-03-20 DIAGNOSIS — H353221 Exudative age-related macular degeneration, left eye, with active choroidal neovascularization: Secondary | ICD-10-CM | POA: Diagnosis not present

## 2017-03-20 DIAGNOSIS — H353132 Nonexudative age-related macular degeneration, bilateral, intermediate dry stage: Secondary | ICD-10-CM | POA: Diagnosis not present

## 2017-03-23 DIAGNOSIS — Z79891 Long term (current) use of opiate analgesic: Secondary | ICD-10-CM | POA: Diagnosis not present

## 2017-05-01 DIAGNOSIS — H353132 Nonexudative age-related macular degeneration, bilateral, intermediate dry stage: Secondary | ICD-10-CM | POA: Diagnosis not present

## 2017-05-01 DIAGNOSIS — H353221 Exudative age-related macular degeneration, left eye, with active choroidal neovascularization: Secondary | ICD-10-CM | POA: Diagnosis not present

## 2017-05-01 DIAGNOSIS — H35371 Puckering of macula, right eye: Secondary | ICD-10-CM | POA: Diagnosis not present

## 2017-05-01 DIAGNOSIS — H353211 Exudative age-related macular degeneration, right eye, with active choroidal neovascularization: Secondary | ICD-10-CM | POA: Diagnosis not present

## 2017-05-08 DIAGNOSIS — H353211 Exudative age-related macular degeneration, right eye, with active choroidal neovascularization: Secondary | ICD-10-CM | POA: Diagnosis not present

## 2017-05-08 DIAGNOSIS — H35371 Puckering of macula, right eye: Secondary | ICD-10-CM | POA: Diagnosis not present

## 2017-05-08 DIAGNOSIS — H43811 Vitreous degeneration, right eye: Secondary | ICD-10-CM | POA: Diagnosis not present

## 2017-05-25 DIAGNOSIS — M545 Low back pain: Secondary | ICD-10-CM | POA: Diagnosis not present

## 2017-05-25 DIAGNOSIS — M5000 Cervical disc disorder with myelopathy, unspecified cervical region: Secondary | ICD-10-CM | POA: Diagnosis not present

## 2017-05-25 DIAGNOSIS — M810 Age-related osteoporosis without current pathological fracture: Secondary | ICD-10-CM | POA: Diagnosis not present

## 2017-06-19 DIAGNOSIS — H2513 Age-related nuclear cataract, bilateral: Secondary | ICD-10-CM | POA: Diagnosis not present

## 2017-06-19 DIAGNOSIS — H353221 Exudative age-related macular degeneration, left eye, with active choroidal neovascularization: Secondary | ICD-10-CM | POA: Diagnosis not present

## 2017-06-19 DIAGNOSIS — H43812 Vitreous degeneration, left eye: Secondary | ICD-10-CM | POA: Diagnosis not present

## 2017-06-19 DIAGNOSIS — H353132 Nonexudative age-related macular degeneration, bilateral, intermediate dry stage: Secondary | ICD-10-CM | POA: Diagnosis not present

## 2017-06-25 DIAGNOSIS — M545 Low back pain: Secondary | ICD-10-CM | POA: Diagnosis not present

## 2017-06-25 DIAGNOSIS — M810 Age-related osteoporosis without current pathological fracture: Secondary | ICD-10-CM | POA: Diagnosis not present

## 2017-06-25 DIAGNOSIS — M5000 Cervical disc disorder with myelopathy, unspecified cervical region: Secondary | ICD-10-CM | POA: Diagnosis not present

## 2017-07-03 DIAGNOSIS — H4051X3 Glaucoma secondary to other eye disorders, right eye, severe stage: Secondary | ICD-10-CM | POA: Diagnosis not present

## 2017-07-03 DIAGNOSIS — H353211 Exudative age-related macular degeneration, right eye, with active choroidal neovascularization: Secondary | ICD-10-CM | POA: Diagnosis not present

## 2017-07-03 DIAGNOSIS — H43811 Vitreous degeneration, right eye: Secondary | ICD-10-CM | POA: Diagnosis not present

## 2017-07-03 DIAGNOSIS — H35371 Puckering of macula, right eye: Secondary | ICD-10-CM | POA: Diagnosis not present

## 2017-07-03 DIAGNOSIS — H2513 Age-related nuclear cataract, bilateral: Secondary | ICD-10-CM | POA: Diagnosis not present

## 2017-07-03 DIAGNOSIS — H353221 Exudative age-related macular degeneration, left eye, with active choroidal neovascularization: Secondary | ICD-10-CM | POA: Diagnosis not present

## 2017-07-18 DIAGNOSIS — H353221 Exudative age-related macular degeneration, left eye, with active choroidal neovascularization: Secondary | ICD-10-CM | POA: Diagnosis not present

## 2017-07-18 DIAGNOSIS — H35371 Puckering of macula, right eye: Secondary | ICD-10-CM | POA: Diagnosis not present

## 2017-07-18 DIAGNOSIS — H353132 Nonexudative age-related macular degeneration, bilateral, intermediate dry stage: Secondary | ICD-10-CM | POA: Diagnosis not present

## 2017-07-18 DIAGNOSIS — H353211 Exudative age-related macular degeneration, right eye, with active choroidal neovascularization: Secondary | ICD-10-CM | POA: Diagnosis not present

## 2017-07-25 DIAGNOSIS — M5000 Cervical disc disorder with myelopathy, unspecified cervical region: Secondary | ICD-10-CM | POA: Diagnosis not present

## 2017-07-25 DIAGNOSIS — M545 Low back pain: Secondary | ICD-10-CM | POA: Diagnosis not present

## 2017-07-25 DIAGNOSIS — M810 Age-related osteoporosis without current pathological fracture: Secondary | ICD-10-CM | POA: Diagnosis not present

## 2017-08-21 DIAGNOSIS — H353132 Nonexudative age-related macular degeneration, bilateral, intermediate dry stage: Secondary | ICD-10-CM | POA: Diagnosis not present

## 2017-08-21 DIAGNOSIS — H35371 Puckering of macula, right eye: Secondary | ICD-10-CM | POA: Diagnosis not present

## 2017-08-21 DIAGNOSIS — H353221 Exudative age-related macular degeneration, left eye, with active choroidal neovascularization: Secondary | ICD-10-CM | POA: Diagnosis not present

## 2017-08-21 DIAGNOSIS — H353211 Exudative age-related macular degeneration, right eye, with active choroidal neovascularization: Secondary | ICD-10-CM | POA: Diagnosis not present

## 2017-08-21 DIAGNOSIS — H4051X3 Glaucoma secondary to other eye disorders, right eye, severe stage: Secondary | ICD-10-CM | POA: Diagnosis not present

## 2017-08-25 DIAGNOSIS — M5000 Cervical disc disorder with myelopathy, unspecified cervical region: Secondary | ICD-10-CM | POA: Diagnosis not present

## 2017-08-25 DIAGNOSIS — M810 Age-related osteoporosis without current pathological fracture: Secondary | ICD-10-CM | POA: Diagnosis not present

## 2017-08-25 DIAGNOSIS — M545 Low back pain: Secondary | ICD-10-CM | POA: Diagnosis not present

## 2017-08-31 DIAGNOSIS — I700 Atherosclerosis of aorta: Secondary | ICD-10-CM | POA: Diagnosis not present

## 2017-08-31 DIAGNOSIS — I10 Essential (primary) hypertension: Secondary | ICD-10-CM | POA: Diagnosis not present

## 2017-08-31 DIAGNOSIS — M545 Low back pain: Secondary | ICD-10-CM | POA: Diagnosis not present

## 2017-08-31 DIAGNOSIS — R569 Unspecified convulsions: Secondary | ICD-10-CM | POA: Diagnosis not present

## 2017-09-25 DIAGNOSIS — M545 Low back pain: Secondary | ICD-10-CM | POA: Diagnosis not present

## 2017-09-25 DIAGNOSIS — M5000 Cervical disc disorder with myelopathy, unspecified cervical region: Secondary | ICD-10-CM | POA: Diagnosis not present

## 2017-09-25 DIAGNOSIS — H353211 Exudative age-related macular degeneration, right eye, with active choroidal neovascularization: Secondary | ICD-10-CM | POA: Diagnosis not present

## 2017-09-25 DIAGNOSIS — M810 Age-related osteoporosis without current pathological fracture: Secondary | ICD-10-CM | POA: Diagnosis not present

## 2017-09-25 DIAGNOSIS — H353221 Exudative age-related macular degeneration, left eye, with active choroidal neovascularization: Secondary | ICD-10-CM | POA: Diagnosis not present

## 2017-09-25 DIAGNOSIS — H401131 Primary open-angle glaucoma, bilateral, mild stage: Secondary | ICD-10-CM | POA: Diagnosis not present

## 2017-09-25 DIAGNOSIS — H353132 Nonexudative age-related macular degeneration, bilateral, intermediate dry stage: Secondary | ICD-10-CM | POA: Diagnosis not present

## 2017-10-03 DIAGNOSIS — H401131 Primary open-angle glaucoma, bilateral, mild stage: Secondary | ICD-10-CM | POA: Diagnosis not present

## 2017-10-03 DIAGNOSIS — H353221 Exudative age-related macular degeneration, left eye, with active choroidal neovascularization: Secondary | ICD-10-CM | POA: Diagnosis not present

## 2017-10-03 DIAGNOSIS — H4051X3 Glaucoma secondary to other eye disorders, right eye, severe stage: Secondary | ICD-10-CM | POA: Diagnosis not present

## 2017-10-03 DIAGNOSIS — H353211 Exudative age-related macular degeneration, right eye, with active choroidal neovascularization: Secondary | ICD-10-CM | POA: Diagnosis not present

## 2017-10-03 DIAGNOSIS — T380X5A Adverse effect of glucocorticoids and synthetic analogues, initial encounter: Secondary | ICD-10-CM | POA: Diagnosis not present

## 2017-10-25 DIAGNOSIS — M810 Age-related osteoporosis without current pathological fracture: Secondary | ICD-10-CM | POA: Diagnosis not present

## 2017-10-25 DIAGNOSIS — M5000 Cervical disc disorder with myelopathy, unspecified cervical region: Secondary | ICD-10-CM | POA: Diagnosis not present

## 2017-10-25 DIAGNOSIS — M545 Low back pain: Secondary | ICD-10-CM | POA: Diagnosis not present

## 2017-11-13 DIAGNOSIS — H353221 Exudative age-related macular degeneration, left eye, with active choroidal neovascularization: Secondary | ICD-10-CM | POA: Diagnosis not present

## 2017-11-13 DIAGNOSIS — H43812 Vitreous degeneration, left eye: Secondary | ICD-10-CM | POA: Diagnosis not present

## 2017-11-13 DIAGNOSIS — H353132 Nonexudative age-related macular degeneration, bilateral, intermediate dry stage: Secondary | ICD-10-CM | POA: Diagnosis not present

## 2017-11-20 DIAGNOSIS — H35371 Puckering of macula, right eye: Secondary | ICD-10-CM | POA: Diagnosis not present

## 2017-11-20 DIAGNOSIS — H401131 Primary open-angle glaucoma, bilateral, mild stage: Secondary | ICD-10-CM | POA: Diagnosis not present

## 2017-11-20 DIAGNOSIS — H353211 Exudative age-related macular degeneration, right eye, with active choroidal neovascularization: Secondary | ICD-10-CM | POA: Diagnosis not present

## 2017-11-20 DIAGNOSIS — H353221 Exudative age-related macular degeneration, left eye, with active choroidal neovascularization: Secondary | ICD-10-CM | POA: Diagnosis not present

## 2017-11-20 DIAGNOSIS — H353132 Nonexudative age-related macular degeneration, bilateral, intermediate dry stage: Secondary | ICD-10-CM | POA: Diagnosis not present

## 2017-11-25 DIAGNOSIS — M545 Low back pain: Secondary | ICD-10-CM | POA: Diagnosis not present

## 2017-11-25 DIAGNOSIS — M810 Age-related osteoporosis without current pathological fracture: Secondary | ICD-10-CM | POA: Diagnosis not present

## 2017-11-25 DIAGNOSIS — M5000 Cervical disc disorder with myelopathy, unspecified cervical region: Secondary | ICD-10-CM | POA: Diagnosis not present

## 2017-12-11 DIAGNOSIS — M5412 Radiculopathy, cervical region: Secondary | ICD-10-CM | POA: Diagnosis not present

## 2017-12-11 DIAGNOSIS — M4126 Other idiopathic scoliosis, lumbar region: Secondary | ICD-10-CM | POA: Diagnosis not present

## 2017-12-11 DIAGNOSIS — R030 Elevated blood-pressure reading, without diagnosis of hypertension: Secondary | ICD-10-CM | POA: Diagnosis not present

## 2017-12-11 DIAGNOSIS — M545 Low back pain: Secondary | ICD-10-CM | POA: Diagnosis not present

## 2017-12-11 DIAGNOSIS — M5416 Radiculopathy, lumbar region: Secondary | ICD-10-CM | POA: Diagnosis not present

## 2017-12-21 ENCOUNTER — Other Ambulatory Visit: Payer: Self-pay | Admitting: Neurosurgery

## 2017-12-21 DIAGNOSIS — M5412 Radiculopathy, cervical region: Secondary | ICD-10-CM

## 2017-12-21 DIAGNOSIS — M5416 Radiculopathy, lumbar region: Secondary | ICD-10-CM

## 2018-01-01 DIAGNOSIS — H353221 Exudative age-related macular degeneration, left eye, with active choroidal neovascularization: Secondary | ICD-10-CM | POA: Diagnosis not present

## 2018-01-01 DIAGNOSIS — H353211 Exudative age-related macular degeneration, right eye, with active choroidal neovascularization: Secondary | ICD-10-CM | POA: Diagnosis not present

## 2018-01-01 DIAGNOSIS — H353132 Nonexudative age-related macular degeneration, bilateral, intermediate dry stage: Secondary | ICD-10-CM | POA: Diagnosis not present

## 2018-01-01 DIAGNOSIS — H401131 Primary open-angle glaucoma, bilateral, mild stage: Secondary | ICD-10-CM | POA: Diagnosis not present

## 2018-01-01 DIAGNOSIS — H4051X3 Glaucoma secondary to other eye disorders, right eye, severe stage: Secondary | ICD-10-CM | POA: Diagnosis not present

## 2018-01-08 DIAGNOSIS — H43811 Vitreous degeneration, right eye: Secondary | ICD-10-CM | POA: Diagnosis not present

## 2018-01-08 DIAGNOSIS — H353132 Nonexudative age-related macular degeneration, bilateral, intermediate dry stage: Secondary | ICD-10-CM | POA: Diagnosis not present

## 2018-01-08 DIAGNOSIS — H353211 Exudative age-related macular degeneration, right eye, with active choroidal neovascularization: Secondary | ICD-10-CM | POA: Diagnosis not present

## 2018-01-08 DIAGNOSIS — H35371 Puckering of macula, right eye: Secondary | ICD-10-CM | POA: Diagnosis not present

## 2018-01-10 ENCOUNTER — Inpatient Hospital Stay (HOSPITAL_COMMUNITY): Payer: Medicare Other

## 2018-01-10 ENCOUNTER — Inpatient Hospital Stay (HOSPITAL_COMMUNITY)
Admission: EM | Admit: 2018-01-10 | Discharge: 2018-01-12 | DRG: 246 | Disposition: A | Discharge status: Home Health | Payer: Medicare Other | Attending: Cardiovascular Disease | Admitting: Cardiovascular Disease

## 2018-01-10 ENCOUNTER — Encounter (HOSPITAL_COMMUNITY)
Admission: EM | Disposition: A | Discharge status: Home Health | Payer: Self-pay | Source: Home / Self Care | Attending: Cardiovascular Disease

## 2018-01-10 ENCOUNTER — Other Ambulatory Visit: Payer: Self-pay

## 2018-01-10 ENCOUNTER — Encounter (HOSPITAL_COMMUNITY): Payer: Self-pay | Admitting: Emergency Medicine

## 2018-01-10 DIAGNOSIS — E785 Hyperlipidemia, unspecified: Secondary | ICD-10-CM | POA: Insufficient documentation

## 2018-01-10 DIAGNOSIS — I462 Cardiac arrest due to underlying cardiac condition: Secondary | ICD-10-CM | POA: Diagnosis not present

## 2018-01-10 DIAGNOSIS — Z955 Presence of coronary angioplasty implant and graft: Secondary | ICD-10-CM

## 2018-01-10 DIAGNOSIS — E876 Hypokalemia: Secondary | ICD-10-CM

## 2018-01-10 DIAGNOSIS — I959 Hypotension, unspecified: Secondary | ICD-10-CM | POA: Diagnosis present

## 2018-01-10 DIAGNOSIS — Z880 Allergy status to penicillin: Secondary | ICD-10-CM | POA: Diagnosis not present

## 2018-01-10 DIAGNOSIS — Z79899 Other long term (current) drug therapy: Secondary | ICD-10-CM

## 2018-01-10 DIAGNOSIS — I2111 ST elevation (STEMI) myocardial infarction involving right coronary artery: Secondary | ICD-10-CM

## 2018-01-10 DIAGNOSIS — M546 Pain in thoracic spine: Secondary | ICD-10-CM | POA: Diagnosis not present

## 2018-01-10 DIAGNOSIS — E782 Mixed hyperlipidemia: Secondary | ICD-10-CM | POA: Diagnosis not present

## 2018-01-10 DIAGNOSIS — K219 Gastro-esophageal reflux disease without esophagitis: Secondary | ICD-10-CM | POA: Diagnosis not present

## 2018-01-10 DIAGNOSIS — J449 Chronic obstructive pulmonary disease, unspecified: Secondary | ICD-10-CM

## 2018-01-10 DIAGNOSIS — R52 Pain, unspecified: Secondary | ICD-10-CM | POA: Diagnosis not present

## 2018-01-10 DIAGNOSIS — R0989 Other specified symptoms and signs involving the circulatory and respiratory systems: Secondary | ICD-10-CM | POA: Diagnosis present

## 2018-01-10 DIAGNOSIS — I2119 ST elevation (STEMI) myocardial infarction involving other coronary artery of inferior wall: Principal | ICD-10-CM | POA: Diagnosis present

## 2018-01-10 DIAGNOSIS — R000 Tachycardia, unspecified: Secondary | ICD-10-CM | POA: Diagnosis not present

## 2018-01-10 DIAGNOSIS — I4901 Ventricular fibrillation: Secondary | ICD-10-CM | POA: Diagnosis not present

## 2018-01-10 DIAGNOSIS — R079 Chest pain, unspecified: Secondary | ICD-10-CM | POA: Diagnosis not present

## 2018-01-10 DIAGNOSIS — I10 Essential (primary) hypertension: Secondary | ICD-10-CM | POA: Diagnosis present

## 2018-01-10 DIAGNOSIS — I469 Cardiac arrest, cause unspecified: Secondary | ICD-10-CM | POA: Diagnosis not present

## 2018-01-10 DIAGNOSIS — I119 Hypertensive heart disease without heart failure: Secondary | ICD-10-CM | POA: Diagnosis not present

## 2018-01-10 DIAGNOSIS — I213 ST elevation (STEMI) myocardial infarction of unspecified site: Secondary | ICD-10-CM

## 2018-01-10 DIAGNOSIS — I2510 Atherosclerotic heart disease of native coronary artery without angina pectoris: Secondary | ICD-10-CM | POA: Diagnosis present

## 2018-01-10 DIAGNOSIS — Z791 Long term (current) use of non-steroidal anti-inflammatories (NSAID): Secondary | ICD-10-CM | POA: Diagnosis not present

## 2018-01-10 DIAGNOSIS — I499 Cardiac arrhythmia, unspecified: Secondary | ICD-10-CM | POA: Diagnosis not present

## 2018-01-10 DIAGNOSIS — K5900 Constipation, unspecified: Secondary | ICD-10-CM | POA: Diagnosis not present

## 2018-01-10 HISTORY — PX: CORONARY/GRAFT ACUTE MI REVASCULARIZATION: CATH118305

## 2018-01-10 HISTORY — PX: LEFT HEART CATH AND CORONARY ANGIOGRAPHY: CATH118249

## 2018-01-10 LAB — LIPID PANEL
CHOL/HDL RATIO: 1.8 ratio
Cholesterol: 151 mg/dL (ref 0–200)
HDL: 82 mg/dL (ref >40)
LDL Cholesterol: 61 mg/dL (ref 0–99)
TRIGLYCERIDES: 39 mg/dL (ref <150)
VLDL: 8 mg/dL (ref 0–40)

## 2018-01-10 LAB — COMPREHENSIVE METABOLIC PANEL
ALT: <5 U/L (ref 0–44)
ANION GAP: 11 (ref 5–15)
AST: 40 U/L (ref 15–41)
Albumin: 3.3 g/dL — ABNORMAL LOW (ref 3.5–5.0)
Alkaline Phosphatase: 65 U/L (ref 38–126)
BUN: 12 mg/dL (ref 8–23)
CHLORIDE: 104 mmol/L (ref 98–111)
CO2: 24 mmol/L (ref 22–32)
Calcium: 8.9 mg/dL (ref 8.9–10.3)
Creatinine, Ser: 0.7 mg/dL (ref 0.44–1.00)
GFR calc Af Amer: >60 mL/min (ref >60)
GFR calc non Af Amer: >60 mL/min (ref >60)
Glucose, Bld: 137 mg/dL — ABNORMAL HIGH (ref 70–99)
Potassium: 5.1 mmol/L (ref 3.5–5.1)
Sodium: 139 mmol/L (ref 135–145)
Total Bilirubin: 1.2 mg/dL (ref 0.3–1.2)
Total Protein: 5.4 g/dL — ABNORMAL LOW (ref 6.5–8.1)

## 2018-01-10 LAB — POCT I-STAT 7, (LYTES, BLD GAS, ICA,H+H)
Acid-Base Excess: 2 mmol/L (ref 0.0–2.0)
Bicarbonate: 25.5 mmol/L (ref 20.0–28.0)
Calcium, Ion: 1.17 mmol/L (ref 1.15–1.40)
HCT: 35 % — ABNORMAL LOW (ref 36.0–46.0)
Hemoglobin: 11.9 g/dL — ABNORMAL LOW (ref 12.0–15.0)
O2 Saturation: 95 %
PCO2 ART: 35.1 mmHg (ref 32.0–48.0)
Potassium: 3.1 mmol/L — ABNORMAL LOW (ref 3.5–5.1)
Sodium: 138 mmol/L (ref 135–145)
TCO2: 27 mmol/L (ref 22–32)
pH, Arterial: 7.47 — ABNORMAL HIGH (ref 7.350–7.450)
pO2, Arterial: 72 mmHg — ABNORMAL LOW (ref 83.0–108.0)

## 2018-01-10 LAB — CBC WITH DIFFERENTIAL/PLATELET
BAND NEUTROPHILS: 0 %
BASOS PCT: 0 %
Basophils Absolute: 0 10*3/uL (ref 0.0–0.1)
Blasts: 0 %
Eosinophils Absolute: 0.4 10*3/uL (ref 0.0–0.5)
Eosinophils Relative: 3 %
HCT: 35.7 % — ABNORMAL LOW (ref 36.0–46.0)
HEMOGLOBIN: 11.6 g/dL — AB (ref 12.0–15.0)
LYMPHS PCT: 64 %
Lymphs Abs: 7.5 10*3/uL — ABNORMAL HIGH (ref 0.7–4.0)
MCH: 30.4 pg (ref 26.0–34.0)
MCHC: 32.5 g/dL (ref 30.0–36.0)
MCV: 93.7 fL (ref 80.0–100.0)
Metamyelocytes Relative: 0 %
Monocytes Absolute: 0.8 10*3/uL (ref 0.1–1.0)
Monocytes Relative: 7 %
Myelocytes: 0 %
NEUTROS PCT: 26 %
Neutro Abs: 3 10*3/uL (ref 1.7–7.7)
OTHER: 0 %
Platelets: 267 10*3/uL (ref 150–400)
Promyelocytes Relative: 0 %
RBC: 3.81 MIL/uL — ABNORMAL LOW (ref 3.87–5.11)
RDW: 13.6 % (ref 11.5–15.5)
WBC: 11.7 10*3/uL — ABNORMAL HIGH (ref 4.0–10.5)
nRBC: 0 % (ref 0.0–0.2)
nRBC: 0 /100 WBC

## 2018-01-10 LAB — MRSA PCR SCREENING: MRSA by PCR: POSITIVE — AB

## 2018-01-10 LAB — CBC
HCT: 32.6 % — ABNORMAL LOW (ref 36.0–46.0)
Hemoglobin: 10.9 g/dL — ABNORMAL LOW (ref 12.0–15.0)
MCH: 29.9 pg (ref 26.0–34.0)
MCHC: 33.4 g/dL (ref 30.0–36.0)
MCV: 89.3 fL (ref 80.0–100.0)
Platelets: 229 10*3/uL (ref 150–400)
RBC: 3.65 MIL/uL — ABNORMAL LOW (ref 3.87–5.11)
RDW: 14 % (ref 11.5–15.5)
WBC: 16 10*3/uL — ABNORMAL HIGH (ref 4.0–10.5)
nRBC: 0 % (ref 0.0–0.2)

## 2018-01-10 LAB — TROPONIN I
Troponin I: 0.45 ng/mL (ref <0.03)
Troponin I: 4.26 ng/mL (ref <0.03)
Troponin I: 9.12 ng/mL (ref <0.03)

## 2018-01-10 LAB — I-STAT CHEM 8, ED
BUN: 17 mg/dL (ref 8–23)
CREATININE: 0.6 mg/dL (ref 0.44–1.00)
Calcium, Ion: 1.07 mmol/L — ABNORMAL LOW (ref 1.15–1.40)
Chloride: 102 mmol/L (ref 98–111)
Glucose, Bld: 135 mg/dL — ABNORMAL HIGH (ref 70–99)
HCT: 36 % (ref 36.0–46.0)
Hemoglobin: 12.2 g/dL (ref 12.0–15.0)
Potassium: 4.6 mmol/L (ref 3.5–5.1)
Sodium: 139 mmol/L (ref 135–145)
TCO2: 32 mmol/L (ref 22–32)

## 2018-01-10 LAB — PROTIME-INR
INR: 0.98
Prothrombin Time: 12.9 seconds (ref 11.4–15.2)

## 2018-01-10 LAB — MAGNESIUM: Magnesium: 1.7 mg/dL (ref 1.7–2.4)

## 2018-01-10 LAB — CBG MONITORING, ED: Glucose-Capillary: 101 mg/dL — ABNORMAL HIGH (ref 70–99)

## 2018-01-10 LAB — I-STAT TROPONIN, ED: Troponin i, poc: 0.47 ng/mL (ref 0.00–0.08)

## 2018-01-10 LAB — ECHOCARDIOGRAM COMPLETE
Height: 61 in
Weight: 1840 oz

## 2018-01-10 LAB — APTT: APTT: 27 s (ref 24–36)

## 2018-01-10 LAB — POCT ACTIVATED CLOTTING TIME
ACTIVATED CLOTTING TIME: 147 s
Activated Clotting Time: 395 seconds

## 2018-01-10 LAB — PATHOLOGIST SMEAR REVIEW

## 2018-01-10 SURGERY — CORONARY/GRAFT ACUTE MI REVASCULARIZATION
Anesthesia: LOCAL

## 2018-01-10 MED ORDER — IOHEXOL 350 MG/ML SOLN
INTRAVENOUS | Status: DC | PRN
  Administered 2018-01-10: 100 mL via INTRA_ARTERIAL

## 2018-01-10 MED ORDER — ENOXAPARIN SODIUM 40 MG/0.4ML ~~LOC~~ SOLN: 40.0000 mg | Freq: Every day | SUBCUTANEOUS | Status: DC

## 2018-01-10 MED ORDER — HYDRALAZINE HCL 20 MG/ML IJ SOLN
5.0000 mg | INTRAMUSCULAR | Status: AC | PRN
  Administered 2018-01-10: 5 mg via INTRAVENOUS
  Filled 2018-01-10: qty 1

## 2018-01-10 MED ORDER — NITROGLYCERIN 1 MG/10 ML FOR IR/CATH LAB
INTRA_ARTERIAL | Status: AC
  Filled 2018-01-10: qty 10

## 2018-01-10 MED ORDER — EPINEPHRINE PF 1 MG/10ML IJ SOSY
PREFILLED_SYRINGE | INTRAMUSCULAR | Status: AC | PRN
  Administered 2018-01-10 (×2): 1 mg via INTRAVENOUS

## 2018-01-10 MED ORDER — ACETAMINOPHEN 325 MG PO TABS: 650.0000 mg | ORAL_TABLET | ORAL | Status: DC | PRN

## 2018-01-10 MED ORDER — HEPARIN SODIUM (PORCINE) 5000 UNIT/ML IJ SOLN
60.0000 [IU]/kg | Freq: Once | INTRAMUSCULAR | Status: AC
  Administered 2018-01-10: 3150 [IU] via INTRAVENOUS
  Filled 2018-01-10: qty 1

## 2018-01-10 MED ORDER — AMIODARONE HCL IN DEXTROSE 360-4.14 MG/200ML-% IV SOLN
INTRAVENOUS | Status: AC
  Filled 2018-01-10: qty 200

## 2018-01-10 MED ORDER — ASPIRIN EC 81 MG PO TBEC: 81.0000 mg | DELAYED_RELEASE_TABLET | Freq: Every day | ORAL | Status: DC

## 2018-01-10 MED ORDER — HEPARIN (PORCINE) 25000 UT/250ML-% IV SOLN
650.0000 [IU]/h | INTRAVENOUS | Status: DC
  Administered 2018-01-10: 650 [IU]/h via INTRAVENOUS
  Filled 2018-01-10: qty 250

## 2018-01-10 MED ORDER — SODIUM CHLORIDE 0.9 % IV SOLN
INTRAVENOUS | Status: AC | PRN
  Administered 2018-01-10: 1.75 mg/kg/h via INTRAVENOUS

## 2018-01-10 MED ORDER — BIVALIRUDIN BOLUS VIA INFUSION - CUPID
INTRAVENOUS | Status: DC | PRN
  Administered 2018-01-10: 39.15 mg via INTRAVENOUS

## 2018-01-10 MED ORDER — SODIUM CHLORIDE 0.9 % IV SOLN: 250.0000 mL | INTRAVENOUS | Status: DC | PRN

## 2018-01-10 MED ORDER — LABETALOL HCL 5 MG/ML IV SOLN: 10.0000 mg | INTRAVENOUS | Status: AC | PRN

## 2018-01-10 MED ORDER — PROSIGHT PO TABS
1.0000 | ORAL_TABLET | Freq: Every day | ORAL | Status: DC
  Administered 2018-01-11 – 2018-01-12 (×2): 1 via ORAL
  Filled 2018-01-10 (×3): qty 1

## 2018-01-10 MED ORDER — NITROGLYCERIN 1 MG/10 ML FOR IR/CATH LAB
INTRA_ARTERIAL | Status: DC | PRN
  Administered 2018-01-10: 100 ug via INTRACORONARY

## 2018-01-10 MED ORDER — METOPROLOL TARTRATE 5 MG/5ML IV SOLN
5.0000 mg | Freq: Once | INTRAVENOUS | Status: AC
  Administered 2018-01-10: 5 mg via INTRAVENOUS
  Filled 2018-01-10: qty 5

## 2018-01-10 MED ORDER — HYDRALAZINE HCL 20 MG/ML IJ SOLN: 5.0000 mg | INTRAMUSCULAR | Status: AC | PRN

## 2018-01-10 MED ORDER — TICAGRELOR 90 MG PO TABS
ORAL_TABLET | ORAL | Status: AC
  Filled 2018-01-10: qty 2

## 2018-01-10 MED ORDER — HEPARIN (PORCINE) IN NACL 1000-0.9 UT/500ML-% IV SOLN
INTRAVENOUS | Status: AC
  Filled 2018-01-10: qty 1000

## 2018-01-10 MED ORDER — EPINEPHRINE PF 1 MG/ML IJ SOLN
0.5000 ug/min | INTRAVENOUS | Status: DC
  Administered 2018-01-10: 5 ug/min via INTRAVENOUS

## 2018-01-10 MED ORDER — ATORVASTATIN CALCIUM 80 MG PO TABS
80.0000 mg | ORAL_TABLET | Freq: Every day | ORAL | Status: DC
  Administered 2018-01-10 – 2018-01-11 (×2): 80 mg via ORAL
  Filled 2018-01-10 (×2): qty 1

## 2018-01-10 MED ORDER — FENTANYL CITRATE (PF) 100 MCG/2ML IJ SOLN: 25.0000 ug | INTRAMUSCULAR | Status: DC | PRN

## 2018-01-10 MED ORDER — SODIUM CHLORIDE 0.9% FLUSH: 3.0000 mL | INTRAVENOUS | Status: DC | PRN

## 2018-01-10 MED ORDER — FLUTICASONE PROPIONATE 50 MCG/ACT NA SUSP
1.0000 | Freq: Every day | NASAL | Status: DC
  Administered 2018-01-10 – 2018-01-12 (×3): 1 via NASAL
  Filled 2018-01-10: qty 16

## 2018-01-10 MED ORDER — CHLORHEXIDINE GLUCONATE CLOTH 2 % EX PADS
6.0000 | MEDICATED_PAD | Freq: Every day | CUTANEOUS | Status: DC
  Administered 2018-01-10 – 2018-01-11 (×2): 6 via TOPICAL

## 2018-01-10 MED ORDER — ONDANSETRON HCL 4 MG/2ML IJ SOLN
4.0000 mg | Freq: Four times a day (QID) | INTRAMUSCULAR | Status: DC | PRN
  Administered 2018-01-12: 4 mg via INTRAVENOUS
  Filled 2018-01-10: qty 2

## 2018-01-10 MED ORDER — DEXTROSE 5 % IV SOLN
INTRAVENOUS | Status: AC | PRN
  Administered 2018-01-10: 150 mg via INTRAVENOUS
  Administered 2018-01-10: 300 mg via INTRAVENOUS

## 2018-01-10 MED ORDER — MUPIROCIN 2 % EX OINT
1.0000 "application " | TOPICAL_OINTMENT | Freq: Two times a day (BID) | CUTANEOUS | Status: DC
  Administered 2018-01-10 – 2018-01-12 (×5): 1 via NASAL
  Filled 2018-01-10 (×3): qty 22

## 2018-01-10 MED ORDER — LIDOCAINE HCL (PF) 1 % IJ SOLN
INTRAMUSCULAR | Status: AC
  Filled 2018-01-10: qty 30

## 2018-01-10 MED ORDER — MORPHINE SULFATE (PF) 2 MG/ML IV SOLN: 2.0000 mg | INTRAVENOUS | Status: DC | PRN

## 2018-01-10 MED ORDER — SODIUM CHLORIDE 0.9% FLUSH
3.0000 mL | Freq: Two times a day (BID) | INTRAVENOUS | Status: DC
  Administered 2018-01-10 – 2018-01-12 (×5): 3 mL via INTRAVENOUS

## 2018-01-10 MED ORDER — METHOCARBAMOL 750 MG PO TABS
750.0000 mg | ORAL_TABLET | Freq: Two times a day (BID) | ORAL | Status: DC
  Administered 2018-01-10 – 2018-01-12 (×5): 750 mg via ORAL
  Filled 2018-01-10 (×6): qty 1

## 2018-01-10 MED ORDER — METOPROLOL TARTRATE 5 MG/5ML IV SOLN
5.0000 mg | Freq: Once | INTRAVENOUS | Status: DC
  Filled 2018-01-10: qty 5

## 2018-01-10 MED ORDER — LIDOCAINE HCL (PF) 1 % IJ SOLN
INTRAMUSCULAR | Status: DC | PRN
  Administered 2018-01-10: 25 mL

## 2018-01-10 MED ORDER — ASPIRIN 81 MG PO CHEW
81.0000 mg | CHEWABLE_TABLET | Freq: Every day | ORAL | Status: DC
  Administered 2018-01-10 – 2018-01-12 (×3): 81 mg via ORAL
  Filled 2018-01-10 (×3): qty 1

## 2018-01-10 MED ORDER — MAGNESIUM SULFATE 50 % IJ SOLN
INTRAMUSCULAR | Status: AC | PRN
  Administered 2018-01-10: 2 g via INTRAVENOUS

## 2018-01-10 MED ORDER — SODIUM BICARBONATE 8.4 % IV SOLN
INTRAVENOUS | Status: AC | PRN
  Administered 2018-01-10: 50 meq via INTRAVENOUS

## 2018-01-10 MED ORDER — HEPARIN SODIUM (PORCINE) 5000 UNIT/ML IJ SOLN
INTRAMUSCULAR | Status: AC
  Filled 2018-01-10: qty 1

## 2018-01-10 MED ORDER — HYDROCODONE-ACETAMINOPHEN 10-325 MG PO TABS
1.0000 | ORAL_TABLET | Freq: Four times a day (QID) | ORAL | Status: DC | PRN
  Administered 2018-01-10 – 2018-01-11 (×4): 1 via ORAL
  Filled 2018-01-10 (×4): qty 1

## 2018-01-10 MED ORDER — ONDANSETRON HCL 4 MG/2ML IJ SOLN
4.0000 mg | Freq: Once | INTRAMUSCULAR | Status: AC
  Administered 2018-01-10: 4 mg via INTRAVENOUS

## 2018-01-10 MED ORDER — LISINOPRIL 5 MG PO TABS
5.0000 mg | ORAL_TABLET | Freq: Every day | ORAL | Status: DC
  Administered 2018-01-10 – 2018-01-11 (×2): 5 mg via ORAL
  Filled 2018-01-10 (×2): qty 1

## 2018-01-10 MED ORDER — ONDANSETRON HCL 4 MG/2ML IJ SOLN
INTRAMUSCULAR | Status: AC
  Filled 2018-01-10: qty 2

## 2018-01-10 MED ORDER — LEVETIRACETAM 500 MG PO TABS
500.0000 mg | ORAL_TABLET | Freq: Two times a day (BID) | ORAL | Status: DC
  Administered 2018-01-10 – 2018-01-12 (×5): 500 mg via ORAL
  Filled 2018-01-10: qty 2
  Filled 2018-01-10 (×2): qty 1
  Filled 2018-01-10: qty 2
  Filled 2018-01-10 (×2): qty 1
  Filled 2018-01-10: qty 2
  Filled 2018-01-10 (×2): qty 1

## 2018-01-10 MED ORDER — ATROPINE SULFATE 1 MG/10ML IJ SOSY
PREFILLED_SYRINGE | INTRAMUSCULAR | Status: AC
  Filled 2018-01-10: qty 10

## 2018-01-10 MED ORDER — POLYVINYL ALCOHOL 1.4 % OP SOLN: Freq: Every day | OPHTHALMIC | Status: DC | PRN

## 2018-01-10 MED ORDER — ATORVASTATIN CALCIUM 40 MG PO TABS: 40.0000 mg | ORAL_TABLET | Freq: Every day | ORAL | Status: DC

## 2018-01-10 MED ORDER — TICAGRELOR 90 MG PO TABS
90.0000 mg | ORAL_TABLET | Freq: Two times a day (BID) | ORAL | Status: DC
  Administered 2018-01-10 – 2018-01-12 (×4): 90 mg via ORAL
  Filled 2018-01-10 (×4): qty 1

## 2018-01-10 MED ORDER — SODIUM CHLORIDE 0.9 % IV SOLN
1.7500 mg/kg/h | INTRAVENOUS | Status: AC
  Administered 2018-01-10 (×2): 1.75 mg/kg/h via INTRAVENOUS
  Filled 2018-01-10: qty 250

## 2018-01-10 MED ORDER — HEPARIN (PORCINE) IN NACL 1000-0.9 UT/500ML-% IV SOLN
INTRAVENOUS | Status: DC | PRN
  Administered 2018-01-10 (×2): 500 mL

## 2018-01-10 MED ORDER — ONDANSETRON HCL 4 MG/2ML IJ SOLN: 4.0000 mg | Freq: Four times a day (QID) | INTRAMUSCULAR | Status: DC | PRN

## 2018-01-10 MED ORDER — METOPROLOL SUCCINATE ER 25 MG PO TB24
25.0000 mg | ORAL_TABLET | Freq: Every day | ORAL | Status: DC
  Administered 2018-01-10 – 2018-01-12 (×3): 25 mg via ORAL
  Filled 2018-01-10 (×3): qty 1

## 2018-01-10 MED ORDER — ALPRAZOLAM 0.5 MG PO TABS
0.5000 mg | ORAL_TABLET | Freq: Three times a day (TID) | ORAL | Status: DC | PRN
  Administered 2018-01-10 – 2018-01-12 (×6): 0.5 mg via ORAL
  Filled 2018-01-10 (×6): qty 1

## 2018-01-10 MED ORDER — POTASSIUM CHLORIDE CRYS ER 10 MEQ PO TBCR
10.0000 meq | EXTENDED_RELEASE_TABLET | Freq: Every day | ORAL | Status: DC
  Administered 2018-01-11 – 2018-01-12 (×2): 10 meq via ORAL
  Filled 2018-01-10 (×3): qty 1

## 2018-01-10 MED ORDER — SODIUM CHLORIDE 0.9 % IV SOLN
INTRAVENOUS | Status: AC
  Administered 2018-01-10 (×2): via INTRAVENOUS

## 2018-01-10 MED ORDER — PANTOPRAZOLE SODIUM 40 MG PO TBEC
40.0000 mg | DELAYED_RELEASE_TABLET | Freq: Every day | ORAL | Status: DC
  Administered 2018-01-11 – 2018-01-12 (×2): 40 mg via ORAL
  Filled 2018-01-10 (×3): qty 1

## 2018-01-10 MED ORDER — ACETAMINOPHEN 325 MG PO TABS
650.0000 mg | ORAL_TABLET | ORAL | Status: DC | PRN
  Administered 2018-01-10: 650 mg via ORAL
  Filled 2018-01-10: qty 2

## 2018-01-10 MED ORDER — SODIUM CHLORIDE 0.9 % IV SOLN
INTRAVENOUS | Status: DC
  Administered 2018-01-10: 05:00:00 via INTRAVENOUS

## 2018-01-10 MED ORDER — HYDROCODONE-ACETAMINOPHEN 7.5-325 MG PO TABS
1.0000 | ORAL_TABLET | Freq: Four times a day (QID) | ORAL | Status: DC | PRN
  Administered 2018-01-10: 1 via ORAL
  Filled 2018-01-10: qty 1

## 2018-01-10 MED ORDER — TICAGRELOR 90 MG PO TABS
ORAL_TABLET | ORAL | Status: DC | PRN
  Administered 2018-01-10: 180 mg via ORAL

## 2018-01-10 MED ORDER — BIVALIRUDIN TRIFLUOROACETATE 250 MG IV SOLR
INTRAVENOUS | Status: AC
  Filled 2018-01-10: qty 250

## 2018-01-10 MED ORDER — NITROGLYCERIN 0.4 MG SL SUBL: 0.4000 mg | SUBLINGUAL_TABLET | SUBLINGUAL | Status: DC | PRN

## 2018-01-10 MED ORDER — ENOXAPARIN SODIUM 40 MG/0.4ML ~~LOC~~ SOLN
40.0000 mg | SUBCUTANEOUS | Status: DC
  Administered 2018-01-11 – 2018-01-12 (×2): 40 mg via SUBCUTANEOUS
  Filled 2018-01-10 (×2): qty 0.4

## 2018-01-10 MED ORDER — MORPHINE SULFATE (PF) 10 MG/ML IV SOLN: 2.0000 mg | INTRAVENOUS | Status: DC | PRN

## 2018-01-10 SURGICAL SUPPLY — 15 items
BALLN SAPPHIRE 2.0X12 (BALLOONS) ×2
BALLN SAPPHIRE ~~LOC~~ 2.75X15 (BALLOONS) ×1 IMPLANT
BALLOON SAPPHIRE 2.0X12 (BALLOONS) IMPLANT
CATH INFINITI 5FR MULTPACK ANG (CATHETERS) ×1 IMPLANT
CATH VISTA GUIDE 6FR JR4 (CATHETERS) ×1 IMPLANT
KIT ENCORE 26 ADVANTAGE (KITS) ×1 IMPLANT
KIT HEART LEFT (KITS) ×2 IMPLANT
PACK CARDIAC CATHETERIZATION (CUSTOM PROCEDURE TRAY) ×2 IMPLANT
SHEATH PINNACLE 6F 10CM (SHEATH) ×1 IMPLANT
STENT SYNERGY DES 2.25X20 (Permanent Stent) ×1 IMPLANT
SYR MEDRAD MARK 7 150ML (SYRINGE) ×2 IMPLANT
TRANSDUCER W/STOPCOCK (MISCELLANEOUS) ×2 IMPLANT
TUBING CIL FLEX 10 FLL-RA (TUBING) ×2 IMPLANT
WIRE EMERALD 3MM-J .035X150CM (WIRE) ×1 IMPLANT
WIRE SION BLUE 180 (WIRE) ×1 IMPLANT

## 2018-01-10 NOTE — Progress Notes (Signed)
   01/10/18 0500  Clinical Encounter Type  Visited With Health care provider  Visit Type Code  Referral From Nurse  Responded to code and provided the ministry of presence. Patient is going to cath lab. Will let incoming Chaplain know about this patient.

## 2018-01-10 NOTE — Progress Notes (Signed)
Paged cardiology on-call PA about patient suddenly awaking from sleep to 10/10 chest pain that "feels different than from the CPR". Ordered to give the IV pain medicine and to continue to tell patient that it is from the CPR. Will give meds and continue to monitor.

## 2018-01-10 NOTE — Code Documentation (Addendum)
Pt more alert now, strong pulses present. SR with PVCs. Decision made to RSI prior to transport to cath lab.

## 2018-01-10 NOTE — ED Notes (Signed)
Defib at 200J

## 2018-01-10 NOTE — Progress Notes (Signed)
CRITICAL VALUE ALERT  Critical Value:  Troponin 4.26  Date & Time Notied:  01/10/2018 1335  Provider Notified: Iver Nestle PA  Orders Received/Actions taken: none - keep trending

## 2018-01-10 NOTE — Care Management (Addendum)
Brilinta benefits check sent and pending. CM team will follow to discuss est monthly copay amount once cost has been determined.    01/10/18 @ 1232-Beverly [name] RNCM-Brilinta benefits check sent and complete the patient has no deductible $2.00 for a 30 day supply; for a 90 day supply no co-pay if she uses the plans preferred mail order; any other it will be $4.00 [id number]  is covered 30 day supply.    [name]. Beverly [name] BSN, NCM-BC, ACM-RN

## 2018-01-10 NOTE — ED Provider Notes (Signed)
TIME SEEN: 4:46 AM  CHIEF COMPLAINT: code STEMI  HPI: Patient is an 85 year old female with history of hypertension, COPD who presents to the emergency department with chest pain.  She presents with working him EMS.  States that she had central chest pain that radiated into her jaw and left arm around 1:30 AM.  States this occurred while at rest while trying to go to sleep.  She had associated diaphoresis but no shortness of breath, nausea, vomiting or lightheadedness.  Took Vicodin and Xanax to try to help with her pain.  When this did not improve her symptoms she called 911.  EMS activated code STEMI in route based on my recommendations for concerning EKG.  She had inferior ST elevation with reciprocal changes in lateral leads.  EMS reports that they gave her 324 mg of aspirin and 1 nitroglycerin.  Nitroglycerin completely resolved patient's pain but dropped her systolic blood pressure into the 80s.  She received approximately 250 mL of IV fluids prior to arrival.  Patient currently pain-free at this time.  PCP is Dr. [name].  Reports she has had previous cardiac catheterization years ago by Dr. Riley Kill.  Does not have any stents.  Does not currently have a cardiologist.  Denies history of diabetes, hyperlipidemia, tobacco use.  ROS: See HPI Constitutional: no fever  Eyes: no drainage  ENT: no runny nose   Cardiovascular:   chest pain  Resp: no SOB  GI: no vomiting GU: no dysuria Integumentary: no rash  Allergy: no hives  Musculoskeletal: no leg swelling  Neurological: no slurred speech ROS otherwise negative  PAST MEDICAL HISTORY/PAST SURGICAL HISTORY:  Past Medical History:  Diagnosis Date  . Anxiety   . Arthritis   . COPD (chronic obstructive pulmonary disease) (HCC)   . Coronary artery disease   . GERD (gastroesophageal reflux disease)   . Headache(784.0)   . Hearing loss, central    Left ear  . Hematoma   . Hypertension   . Memory changes   . Seizures (HCC) 10/2012   no  seizures, speech issues; after a fall, hitting head on left side    MEDICATIONS:  Prior to Admission medications   Medication Sig Start Date End Date Taking? Authorizing Provider  ALPRAZolam Prudy Feeler) 0.5 MG tablet Take 0.5 mg by mouth 3 (three) times daily as needed for anxiety.    [provider]  celecoxib (CELEBREX) 200 MG capsule Take 200 mg by mouth daily.    [provider]  cholecalciferol (VITAMIN D) 1000 UNITS tablet Take 1,000 Units by mouth 2 (two) times daily as needed.     [provider]  fluticasone (FLONASE) 50 MCG/ACT nasal spray Place 1 spray into both nostrils daily.  09/18/12   [provider]  HYDROcodone-acetaminophen (NORCO) 7.5-325 MG per tablet Take 1 tablet by mouth every 6 (six) hours as needed for pain.     [provider]  levETIRAcetam (KEPPRA) 500 MG tablet TAKE ONE TABLET TWICE DAILY 12/08/15   Drema Dallas, DO  methocarbamol (ROBAXIN) 750 MG tablet Take 750 mg by mouth 2 (two) times daily.     [provider]  metoprolol succinate (TOPROL-XL) 50 MG 24 hr tablet Take 25 mg by mouth daily. Take with or immediately following a meal.    [provider]  Multiple Vitamins-Minerals (PRESERVISION/LUTEIN PO) Take 1 tablet by mouth 2 (two) times daily.    [provider]  pantoprazole (PROTONIX) 40 MG tablet Take 40 mg by mouth daily.  [provider]  potassium chloride (K-DUR) 10 MEQ tablet Take 10 mEq by mouth daily.     [provider]  Propylene Glycol (SYSTANE BALANCE OP) Place 1-2 drops into both eyes daily as needed (dry eyes).    [provider]    ALLERGIES:  Allergies  Allergen Reactions  . Penicillins Itching    SOCIAL HISTORY:  Social History   Tobacco Use  . Smoking status: Never Smoker  . Smokeless tobacco: Never Used  Substance Use Topics  . Alcohol use: No    FAMILY HISTORY: Family History  Problem Relation Age of Onset  . Ataxia Neg Hx    . Chorea Neg Hx   . Dementia Neg Hx   . Mental retardation Neg Hx   . Migraines Neg Hx   . Multiple sclerosis Neg Hx   . Neurofibromatosis Neg Hx   . Neuropathy Neg Hx   . Parkinsonism Neg Hx   . Seizures Neg Hx   . Stroke Neg Hx     EXAM: Resp 13   Ht 5\' 1"  (1.549 m)   Wt 52.2 kg   SpO2 95%   BMI 21.73 kg/m Temp 97.3, heart rate 69, respiratory rate 18, blood pressure 148/98, sats 95% on room air CONSTITUTIONAL: Alert and oriented and responds appropriately to questions.  Elderly, in no distress HEAD: Normocephalic EYES: Conjunctivae clear, pupils appear equal, EOMI ENT: normal nose; moist mucous membranes NECK: Supple, no meningismus, no nuchal rigidity, no LAD  CARD: RRR; S1 and S2 appreciated; no murmurs, no clicks, no rubs, no gallops RESP: Normal chest excursion without splinting or tachypnea; breath sounds clear and equal bilaterally; no wheezes, no rhonchi, no rales, no hypoxia or respiratory distress, speaking full sentences ABD/GI: Normal bowel sounds; non-distended; soft, non-tender, no rebound, no guarding, no peritoneal signs, no hepatosplenomegaly BACK:  The back appears normal and is non-tender to palpation, there is no CVA tenderness EXT: Normal ROM in all joints; non-tender to palpation; no edema; normal capillary refill; no cyanosis, no calf tenderness or swelling    SKIN: Normal color for age and race; warm; no rash NEURO: Moves all extremities equally PSYCH: The patient's mood and manner are appropriate. Grooming and personal hygiene are appropriate.  MEDICAL DECISION MAKING: Patient here as a code STEMI.  Code STEMI canceled by Dr. Allyson SabalBerry, interventionalist on call, prior to patient's arrival in the ED.  Patient currently pain-free at arrival to the ED and repeat EKG has improved with no ST changes.  Cardiology fellow Dr. Aron BabaWilmot at bedside.  Appreciate his help.  Will give heparin bolus, drip.  She has received aspirin.  At this time she would like to be a full  code.  Vital signs within normal limits.  ED PROGRESS:     4:45 AM  Pt now beginning to have chest pain again.  Rates it a 5/10.  Repeat EKG shows return of ST elevation in inferior leads with reciprocal changes in lateral leads.  Cardiology fellow aware.  Patient's troponin elevated to 0.47.   4:53 AM  Pt's pain continues to worsen.  Dr. Allyson SabalBerry updated by Dr. Aron BabaWilmot.  Inferior ST segments continue to rise.   4:58 AM  Code STEMI reactivated by Dr. Aron BabaWilmot per Dr. Allyson SabalBerry.   5:30 AM  Called to patient's bedside by nursing staff at 5 AM as her heart rate went into the 170s.  It appeared she was in ventricular fibrillation and unresponsive.  CPR was started and patient was defibrillated.  Epinephrine,  bicarbonate given.  Patient then appeared to be in ventricular tachycardia, possible torsades.  Defibrillated a second time.  Given second epinephrine.  Given 300 mg amiodarone bolus.  Blood glucose during CPR was 111.  Patient began moaning, grabbing at staff during CPR.  CPR stopped and pulses checked.  We had return of circulation after 2 rounds of CPR, approximately 6-7 minutes of CPR total.  Repeat EKG showed still some ST elevation in inferior leads but has improved.  Patient talking and mentating normally.  She reports she has chest pain-free.  Started amiodarone drip.  Also given 2 g magnesium IV push for possible torsades.  Decision made to hold off on intubation.  Patient breathing on her own.  Sats 99% on 2 L nasal cannula.  BP 130s/70s.   Patient's blood pressure slowly declining.  In the 70s/50s.  Epinephrine started and titrated as needed.  Patient still mentating normally.  Patient waiting for Cath Lab to arrive.  Patient's daughter at bedside.   5:45 AM  Pt transported in stable condition to the cardiac catheterization lab.  Blood pressure in the 120s/70s.  Mentating normally.  No active chest pain.   EKG Interpretation  Date/Time:  Wednesday January 10 2018 04:27:19 EST Ventricular  Rate:  83 PR Interval:    QRS Duration: 95 QT Interval:  367 QTC Calculation: 432 R Axis:   64 Text Interpretation:  Sinus rhythm Ventricular trigeminy which is new compared to previous Short PR interval Left atrial enlargement Nonspecific T abnormalities, lateral leads Confirmed by Rochele Raring,  [phone]([id number]) on 01/10/2018 4:47:51 AM          EKG Interpretation  Date/Time:  Wednesday January 10 2018 04:42:26 EST Ventricular Rate:  65 PR Interval:    QRS Duration: 75 QT Interval:  395 QTC Calculation: 411 R Axis:   66 Text Interpretation:  Sinus rhythm Ventricular premature complex Borderline prolonged PR interval Probable left atrial enlargement Inferior infarct, acute (RCA) Probable RV involvement, suggest recording right precordial leads >>> Acute MI <<< Confirmed by Rochele Raring,  [phone]([id number]) on 01/10/2018 4:49:14 AM        EKG Interpretation  Date/Time:  Wednesday January 10 2018 05:07:40 EST Ventricular Rate:  84 PR Interval:    QRS Duration: 78 QT Interval:  281 QTC Calculation: 332 R Axis:   89 Text Interpretation:  Sinus rhythm Aberrant conduction of SV complex(es) Consider left atrial enlargement Inferior infarct, acute (RCA) Anteroseptal infarct, age indeterminate Probable RV involvement, suggest recording right precordial leads >>> Acute MI <<< When compared with ECG of EARLIER SAME DATE Premature ventricular complexes are no longer present ST elevation in Inferior leads is slightly less prominent Confirmed by Dione BoozeGlick, David ([id number]) on 01/10/2018 5:47:01 AM        CRITICAL CARE Performed by: Baxter Hire    Total critical care time: 75 minutes  Critical care time was exclusive of separately billable procedures and treating other patients.  Critical care was necessary to treat or prevent imminent or life-threatening deterioration.  Critical care was time spent personally by me on the following activities: development of treatment plan with patient and/or surrogate as well  as nursing, discussions with consultants, evaluation of patient's response to treatment, examination of patient, obtaining history from patient or surrogate, ordering and performing treatments and interventions, ordering and review of laboratory studies, ordering and review of radiographic studies, pulse oximetry and re-evaluation of patient's condition.   Cardiopulmonary Resuscitation (CPR) Procedure Note Directed/Performed by: Rochele Raring  I personally directed ancillary staff and/or performed CPR  in an effort to regain return of spontaneous circulation and to maintain cardiac, neuro and systemic perfusion.      , Layla Maw, DO 01/10/18 [phone]

## 2018-01-10 NOTE — Progress Notes (Signed)
At 1730 this RN went in to give patient her 1800 meds and to assess groin site again. Patient with hematoma present that was not there 30 mins prior to that assessment. BP 150s-170s. On-call cardiology NP paged. Orders for hydralazine and for a stat CBC placed. Manual pressure applied for 20 mins and hematoma much smaller and soft. Site level 2.

## 2018-01-10 NOTE — Progress Notes (Signed)
ANTICOAGULATION CONSULT NOTE - Initial Consult  Pharmacy Consult for Heparin Indication: chest pain/ACS  Allergies  Allergen Reactions  . Penicillins Itching    Patient Measurements: Height: 5\' 1"  (154.9 cm) Weight: 115 lb (52.2 kg) IBW/kg (Calculated) : 47.8  Vital Signs:    Labs: Recent Labs    01/10/18 0433  HGB 12.2  HCT 36.0  CREATININE 0.60    Estimated Creatinine Clearance: 40.2 mL/min (by C-G formula based on SCr of 0.6 mg/dL).   Medical History: Past Medical History:  Diagnosis Date  . Anxiety   . Arthritis   . COPD (chronic obstructive pulmonary disease) (HCC)   . Coronary artery disease   . GERD (gastroesophageal reflux disease)   . Headache(784.0)   . Hearing loss, central    Left ear  . Hematoma   . Hypertension   . Memory changes   . Seizures (HCC) 10/2012   no seizures, speech issues; after a fall, hitting head on left side    Medications:  No current facility-administered medications on file prior to encounter.    Current Outpatient Medications on File Prior to Encounter  Medication Sig Dispense Refill  . ALPRAZolam (XANAX) 0.5 MG tablet Take 0.5 mg by mouth 3 (three) times daily as needed for anxiety.    . celecoxib (CELEBREX) 200 MG capsule Take 200 mg by mouth daily.    . cholecalciferol (VITAMIN D) 1000 UNITS tablet Take 1,000 Units by mouth 2 (two) times daily as needed.     . fluticasone (FLONASE) 50 MCG/ACT nasal spray Place 1 spray into both nostrils daily.     Marland Kitchen HYDROcodone-acetaminophen (NORCO) 7.5-325 MG per tablet Take 1 tablet by mouth every 6 (six) hours as needed for pain.     Marland Kitchen levETIRAcetam (KEPPRA) 500 MG tablet TAKE ONE TABLET TWICE DAILY 60 tablet 2  . methocarbamol (ROBAXIN) 750 MG tablet Take 750 mg by mouth 2 (two) times daily.     . metoprolol succinate (TOPROL-XL) 50 MG 24 hr tablet Take 25 mg by mouth daily. Take with or immediately following a meal.    . Multiple Vitamins-Minerals (PRESERVISION/LUTEIN PO) Take 1  tablet by mouth 2 (two) times daily.    . pantoprazole (PROTONIX) 40 MG tablet Take 40 mg by mouth daily.    . potassium chloride (K-DUR) 10 MEQ tablet Take 10 mEq by mouth daily.     Marland Kitchen Propylene Glycol (SYSTANE BALANCE OP) Place 1-2 drops into both eyes daily as needed (dry eyes).       Assessment: 85 y.o. female with chest pain for heparin  Goal of Therapy:  Heparin level 0.3-0.7 units/ml Monitor platelets by anticoagulation protocol: Yes   Plan:  Heparin 3150 units IV bolus as ordered, then start heparin 650 units/hr Check heparin level in 8 hours.    Eddie Candle 01/10/2018,4:41 AM

## 2018-01-10 NOTE — Progress Notes (Signed)
Paged Cooper MD about patient's BP 150s-160s. Orders for lisinopril 5 mg daily with first dose being now.

## 2018-01-10 NOTE — ED Triage Notes (Signed)
BIB EMS from home, reporting sudden onset CP around 0130 this morning. Describes it as an "all over pain that radiates to her jaw." Given 324mg  ASA, 0.4mg  nitro. SBP decr'd almost 100pts after nitro.

## 2018-01-10 NOTE — Progress Notes (Signed)
Right femoral arterial sheath removed per order at 1407. Manual pressure held for 20 minutes. VSS. Level 1 with minimal bruising.

## 2018-01-10 NOTE — ED Notes (Signed)
Dr Elesa Massed informed of troponin results .47

## 2018-01-10 NOTE — Code Documentation (Signed)
defib at 120J

## 2018-01-10 NOTE — Code Documentation (Signed)
Pt mental status good. Speaking with EDP. Choice made to hold RSI.

## 2018-01-10 NOTE — Code Documentation (Addendum)
Defib at 150J

## 2018-01-10 NOTE — Progress Notes (Signed)
EKG CRITICAL VALUE     12 lead EKG performed.  Critical value noted.  Laury Axon, RN notified.   Hal Norrington, CCT 01/10/2018 7:56 AM

## 2018-01-10 NOTE — Progress Notes (Signed)
Interventional Note: The patient is interviewed and evaluated.  She is complaining of soreness in her central chest related to CPR.  She has no other complaints.  She is currently being treated with IV bivalirudin which is scheduled to be discontinued in a few hours.  She is appropriately treated with aspirin and ticagrelor, high intensity statin drug, and a beta-blocker.  We will keep her in the ICU today and anticipate transfer to the floor tomorrow.  We will discontinue IV amiodarone as her rhythm appears to be stable post reperfusion.  Beverly [name] 01/10/2018 9:23 AM

## 2018-01-10 NOTE — H&P (Signed)
Cardiology Admission History and Physical:   Patient ID: Beverly [name] MRN: [id number]; DOB: 19-Aug-1932   Admission date: 01/10/2018  Primary Care Provider: Kari Baars, MD Primary Cardiologist: No primary care provider on file.  Primary Electrophysiologist:  None   Chief Complaint:  Chest pain  Patient Profile:   Beverly [name] is a 85 y.o. female with Since hypertension and COPD.  Reported history of cath in the past unknown anatomy.  Echocardiogram in 2014 demonstrating preserved LV function 55 to 60%  History of Present Illness:   Beverly [name] she is a pleasant lady in acute distress presents with severe central chest pain.  She states she was awakened out of sleep with severe chest discomfort with radiation into her neck and jaw.  Initially thought it was related to her typical chronic arthritic discomfort however taking pain medicines did not improve her symptoms.  These were associated with diaphoresis.  Her chest pain symptoms did not improve so she called EMS.  In the EMS truck was found to have inferior ST elevations which were transmitted to the ER and his code STEMI was called initially.  EKG changes stated started to reduce and interventional took a view look at the imaging ECGs and code STEMI was canceled.  Receive a dose of nitroglycerin in the EMS truck and chest pain resolved however became supra very hypotensive with bp in the 80s blood pressure in the 80s systolic from hypertensive blood pressure  General cardiology was consulted to evaluate patient and in the ER initially patient looked well was not complaining of any chest pain.  ST elevations had resolved and she was in sinus rhythm without issue.  Evaluate patient talk with family with the plan initially to proceed with cath later this morning.  As I was leaving to see patient patient started to develop chest pain and noted current ST elevation in the inferior leads.  Patient had been initiated on heparin and given  beta-blocker with the hope that this might suppress symptoms however symptoms persisted and worsened and patient had chest pain at the level 5 out of 10.  Given these findings after discussion with interventional was plan was to proceed with cath.  While awaiting Cath Lab team patient had episode of likely ventricular tachycardia polymorphic in nature with heart rates in the 160s lost her pulse and was unresponsive and required CPR with 2 rounds and also about 2 or 3 defibrillator shocks.  Received bolus of amiodarone and was put on amiodarone drip.  After about a 5-minute code this resolved and patient was back to her baseline was alert oriented able to protect her airway.  Cath Lab team to see patient soon and will be going for urgent cath.     Past Medical History:  Diagnosis Date  . Anxiety   . Arthritis   . COPD (chronic obstructive pulmonary disease) (HCC)   . Coronary artery disease   . GERD (gastroesophageal reflux disease)   . Headache(784.0)   . Hearing loss, central    Left ear  . Hematoma   . Hypertension   . Memory changes   . Seizures (HCC) 10/2012   no seizures, speech issues; after a fall, hitting head on left side    Past Surgical History:  Procedure Laterality Date  . ABDOMINAL HYSTERECTOMY    . APPENDECTOMY    . CARDIAC CATHETERIZATION     15 yrs ago  . CRANIOTOMY Left 10/30/2012   Procedure: CRANIOTOMY HEMATOMA EVACUATION SUBDURAL;  Surgeon:  Maeola Harman, MD;  Location: [facility];  Service: Neurosurgery;  Laterality: Left;  Left Craniotomy for evacuation of subdural hematoma  . JOINT REPLACEMENT Bilateral    knees     Medications Prior to Admission: Prior to Admission medications   Medication Sig Start Date End Date Taking? Authorizing Provider  ALPRAZolam Prudy Feeler) 0.5 MG tablet Take 0.5 mg by mouth 3 (three) times daily as needed for anxiety.    [provider]  celecoxib (CELEBREX) 200 MG capsule Take 200 mg by mouth daily.    [provider]  cholecalciferol (VITAMIN D) 1000 UNITS tablet Take 1,000 Units by mouth 2 (two) times daily as needed.     [provider]  fluticasone (FLONASE) 50 MCG/ACT nasal spray Place 1 spray into both nostrils daily.  09/18/12   [provider]  HYDROcodone-acetaminophen (NORCO) 7.5-325 MG per tablet Take 1 tablet by mouth every 6 (six) hours as needed for pain.     [provider]  levETIRAcetam (KEPPRA) 500 MG tablet TAKE ONE TABLET TWICE DAILY 12/08/15   Drema Dallas, DO  methocarbamol (ROBAXIN) 750 MG tablet Take 750 mg by mouth 2 (two) times daily.     [provider]  metoprolol succinate (TOPROL-XL) 50 MG 24 hr tablet Take 25 mg by mouth daily. Take with or immediately following a meal.    [provider]  Multiple Vitamins-Minerals (PRESERVISION/LUTEIN PO) Take 1 tablet by mouth 2 (two) times daily.    [provider]  pantoprazole (PROTONIX) 40 MG tablet Take 40 mg by mouth daily.    [provider]  potassium chloride (K-DUR) 10 MEQ tablet Take 10 mEq by mouth daily.     [provider]  Propylene Glycol (SYSTANE BALANCE OP) Place 1-2 drops into both eyes daily as needed (dry eyes).    [provider]     Allergies:    Allergies  Allergen Reactions  . Penicillins Itching    Social History:   Social History   Socioeconomic History  . Marital status: Married    Spouse name: Not on file  . Number of children: Not on file  . Years of education: Not on file  . Highest education level: Not on file  Occupational History  . Not on file  Social Needs  . Financial resource strain: Not on file  . Food insecurity:    Worry: Not on file    Inability: Not on file  . Transportation needs:    Medical: Not on file    Non-medical: Not on file  Tobacco Use  . Smoking status: Never Smoker  . Smokeless tobacco: Never Used  Substance and Sexual Activity  . Alcohol use: No  . Drug use: No  . Sexual  activity: Not on file  Lifestyle  . Physical activity:    Days per week: Not on file    Minutes per session: Not on file  . Stress: Not on file  Relationships  . Social connections:    Talks on phone: Not on file    Gets together: Not on file    Attends religious service: Not on file    Active member of club or organization: Not on file    Attends meetings of clubs or organizations: Not on file    Relationship status: Not on file  . Intimate partner violence:    Fear of current or ex partner: Not on file    Emotionally abused: Not on file  Physically abused: Not on file    Forced sexual activity: Not on file  Other Topics Concern  . Not on file  Social History Narrative  . Not on file    Family History:  No family hx cad The patient's family history is negative for Ataxia, Chorea, Dementia, Mental retardation, Migraines, Multiple sclerosis, Neurofibromatosis, Neuropathy, Parkinsonism, Seizures, and Stroke.     Review of Systems: [y] = yes, [ ]  = no   . General: Weight gain [ ] ; Weight loss [ ] ; Anorexia [ ] ; Fatigue [ ] ; Fever [ ] ; Chills [ ] ; Weakness [ ]   . Cardiac: Chest pain/pressure [ ] ; Resting SOB [ ] ; Exertional SOB [ ] ; Orthopnea [ ] ; Pedal Edema [ ] ; Palpitations [ ] ; Syncope [ ] ; Presyncope [ ] ; Paroxysmal nocturnal dyspnea[ ]   . Pulmonary: Cough [ ] ; Wheezing[ ] ; Hemoptysis[ ] ; Sputum [ ] ; Snoring [ ]   . GI: Vomiting[ ] ; Dysphagia[ ] ; Melena[ ] ; Hematochezia [ ] ; Heartburn[ ] ; Abdominal pain [ ] ; Constipation [ ] ; Diarrhea [ ] ; BRBPR [ ]   . GU: Hematuria[ ] ; Dysuria [ ] ; Nocturia[ ]   . Vascular: Pain in legs with walking [ ] ; Pain in feet with lying flat [ ] ; Non-healing sores [ ] ; Stroke [ ] ; TIA [ ] ; Slurred speech [ ] ;  . Neuro: Headaches[ ] ; Vertigo[ ] ; Seizures[ ] ; Paresthesias[ ] ;Blurred vision [ ] ; Diplopia [ ] ; Vision changes [ ]   . Ortho/Skin: Arthritis [ ] ; Joint pain [ ] ; Muscle pain [ ] ; Joint swelling [ ] ; Back Pain [ ] ; Rash [ ]   . Psych: Depression[  ]; Anxiety[ ]   . Heme: Bleeding problems [ ] ; Clotting disorders [ ] ; Anemia [ ]   . Endocrine: Diabetes [ ] ; Thyroid dysfunction[ ]   Physical Exam/Data:   Vitals:   01/10/18 0423 01/10/18 0427 01/10/18 0432  Resp:  13   SpO2: 95%    Weight:   52.2 kg  Height:   5\' 1"  (1.549 m)   No intake or output data in the 24 hours ending 01/10/18 0449 Filed Weights   01/10/18 0432  Weight: 52.2 kg   Body mass index is 21.73 kg/m.  General:  Well nourished, well developed, transiently in severe distress HEENT: normal Lymph: no adenopathy Neck: no JVD Endocrine:  No thryomegaly Vascular: No carotid bruits; FA pulses 2+ bilaterally without bruits  Cardiac:  normal S1, S2; RRR; no murmur  Lungs:  clear to auscultation bilaterally, no wheezing, rhonchi or rales  Abd: soft, nontender, no hepatomegaly  Ext: no edema Musculoskeletal:  No deformities, BUE and BLE strength normal and equal Skin: warm and dry  Neuro:  CNs 2-12 intact, no focal abnormalities noted Psych:  Normal affect    EKG:  Mulitple ECG that was done 1/6 was personally reviewed and demonstrates transient ST elevations in inferior leads which eventually became persistent.  Episode of also ventricular tachycardia, polymorphic.  Relevant CV Studies: Opponent 0.47  Laboratory Data:  Chemistry Recent Labs  Lab 01/10/18 0433  NA 139  K 4.6  CL 102  GLUCOSE 135*  BUN 17  CREATININE 0.60    No results for input(s): PROT, ALBUMIN, AST, ALT, ALKPHOS, BILITOT in the last 168 hours. Hematology Recent Labs  Lab 01/10/18 0433  HGB 12.2  HCT 36.0   Cardiac EnzymesNo results for input(s): TROPONINI in the last 168 hours.  Recent Labs  Lab 01/10/18 0432  TROPIPOC 0.47*    BNPNo results for input(s): BNP, PROBNP in the last 168 hours.  DDimer No results for input(s): DDIMER in the last 168 hours.  Radiology/Studies:  No results found.  Assessment and Plan:   1. Inferior ST elevation myocardial  infarction 2. Ventricular tachycardia arrest, polymoprhic 3. Essential hypertension 4. Dyslipidemia   CRITICAL CARE Performed by: Macie BurowsKobina A Simran Mannis   Total critical care time: 75 minutes  Critical care time was exclusive of separately billable procedures and treating other patients.  Critical care was necessary to treat or prevent imminent or life-threatening deterioration.  Critical care was time spent personally by me on the following activities: development of treatment plan with patient and/or surrogate as well as nursing, discussions with consultants, evaluation of patient's response to treatment, examination of patient, obtaining history from patient or surrogate, ordering and performing treatments and interventions, ordering and review of laboratory studies, ordering and review of radiographic studies, pulse oximetry and re-evaluation of patient's condition.   Severity of Illness: The appropriate patient status for this patient is INPATIENT. Inpatient status is judged to be reasonable and necessary in order to provide the required intensity of service to ensure the patient's safety. The patient's presenting symptoms, physical exam findings, and initial radiographic and laboratory data in the context of their chronic comorbidities is felt to place them at high risk for further clinical deterioration. Furthermore, it is not anticipated that the patient will be medically stable for discharge from the hospital within 2 midnights of admission. The following factors support the patient status of inpatient.   " The patient's presenting symptoms include inferior ST elevations, ventricular tachycardia cardiac arrest. " The worrisome physical exam findings include loss of pulse and requiring CPR. " The initial radiographic and laboratory data are worrisome because of troponin elevation. " The chronic co-morbidities include elderly age.   * I certify that at the point of admission it is my  clinical judgment that the patient will require inpatient hospital care spanning beyond 2 midnights from the point of admission due to high intensity of service, high risk for further deterioration and high frequency of surveillance required.*    For questions or updates, please contact [facility] Please consult [email or web address] for contact info under        Signed, Macie BurowsKobina A Gabino Hagin, MD  01/10/2018 4:49 AM

## 2018-01-11 ENCOUNTER — Inpatient Hospital Stay (HOSPITAL_COMMUNITY): Payer: Medicare Other

## 2018-01-11 LAB — BASIC METABOLIC PANEL
Anion gap: 9 (ref 5–15)
BUN: 9 mg/dL (ref 8–23)
CO2: 26 mmol/L (ref 22–32)
Calcium: 8.7 mg/dL — ABNORMAL LOW (ref 8.9–10.3)
Chloride: 100 mmol/L (ref 98–111)
Creatinine, Ser: 0.53 mg/dL (ref 0.44–1.00)
GFR calc non Af Amer: >60 mL/min (ref >60)
Glucose, Bld: 120 mg/dL — ABNORMAL HIGH (ref 70–99)
Potassium: 3.1 mmol/L — ABNORMAL LOW (ref 3.5–5.1)
Sodium: 135 mmol/L (ref 135–145)

## 2018-01-11 LAB — CBC
HCT: 31.2 % — ABNORMAL LOW (ref 36.0–46.0)
HEMOGLOBIN: 10.5 g/dL — AB (ref 12.0–15.0)
MCH: 30.7 pg (ref 26.0–34.0)
MCHC: 33.7 g/dL (ref 30.0–36.0)
MCV: 91.2 fL (ref 80.0–100.0)
Platelets: 201 10*3/uL (ref 150–400)
RBC: 3.42 MIL/uL — ABNORMAL LOW (ref 3.87–5.11)
RDW: 14.2 % (ref 11.5–15.5)
WBC: 12.4 10*3/uL — ABNORMAL HIGH (ref 4.0–10.5)
nRBC: 0.3 % — ABNORMAL HIGH (ref 0.0–0.2)

## 2018-01-11 LAB — TROPONIN I: Troponin I: 8.64 ng/mL (ref <0.03)

## 2018-01-11 MED ORDER — DOCUSATE SODIUM 100 MG PO CAPS
100.0000 mg | ORAL_CAPSULE | Freq: Every day | ORAL | Status: DC
  Administered 2018-01-11 – 2018-01-12 (×2): 100 mg via ORAL
  Filled 2018-01-11 (×2): qty 1

## 2018-01-11 MED ORDER — POTASSIUM CHLORIDE 20 MEQ PO PACK
20.0000 meq | PACK | Freq: Two times a day (BID) | ORAL | Status: AC
  Administered 2018-01-11 (×2): 20 meq via ORAL
  Filled 2018-01-11 (×3): qty 1

## 2018-01-11 MED ORDER — HYDROCODONE-ACETAMINOPHEN 10-325 MG PO TABS
1.0000 | ORAL_TABLET | ORAL | Status: DC | PRN
  Administered 2018-01-11 – 2018-01-12 (×7): 1 via ORAL
  Filled 2018-01-11 (×7): qty 1

## 2018-01-11 MED FILL — Medication: Qty: 1 | Status: AC

## 2018-01-11 NOTE — Progress Notes (Signed)
Progress Note  Patient Name: Beverly [name] Date of Encounter: 01/11/2018  Primary Cardiologist: No primary care provider on file.   Subjective   Continues to complain of chest soreness from CPR.  No dyspnea.  Complains of constipation.  Inpatient Medications    Scheduled Meds: . aspirin  81 mg Oral Daily  . atorvastatin  80 mg Oral q1800  . Chlorhexidine Gluconate Cloth  6 each Topical Q0600  . docusate sodium  100 mg Oral Daily  . enoxaparin (LOVENOX) injection  40 mg Subcutaneous Q24H  . fluticasone  1 spray Each Nare Daily  . levETIRAcetam  500 mg Oral BID  . lisinopril  5 mg Oral Daily  . methocarbamol  750 mg Oral BID  . metoprolol succinate  25 mg Oral Daily  . multivitamin  1 tablet Oral Daily  . mupirocin ointment  1 application Nasal BID  . pantoprazole  40 mg Oral Daily  . potassium chloride  10 mEq Oral Daily  . potassium chloride  20 mEq Oral BID  . sodium chloride flush  3 mL Intravenous Q12H  . ticagrelor  90 mg Oral BID   Continuous Infusions: . sodium chloride    . epinephrine 5 mcg/min (01/10/18 0620)   PRN Meds: sodium chloride, acetaminophen, ALPRAZolam, fentaNYL (SUBLIMAZE) injection, HYDROcodone-acetaminophen, nitroGLYCERIN, ondansetron (ZOFRAN) IV, polyvinyl alcohol, sodium chloride flush   Vital Signs    Vitals:   01/11/18 0800 01/11/18 0900 01/11/18 1000 01/11/18 1100  BP: (!) 145/78 (!) 158/78 121/62 119/69  Pulse: 84 83 82 80  Resp: (!) 33 (!) 28 (!) 22 (!) 24  Temp: 98.8 F (37.1 C)     TempSrc: Oral     SpO2: 100% 100% 100% 100%  Weight:      Height:        Intake/Output Summary (Last 24 hours) at 01/11/2018 1214 Last data filed at 01/11/2018 0000 Gross per 24 hour  Intake 1013.86 ml  Output 1100 ml  Net -86.14 ml   Filed Weights   01/10/18 0432 01/11/18 0500  Weight: 52.2 kg 53 kg    Telemetry    Normal sinus rhythm without significant arrhythmia- Personally Reviewed   Physical Exam  Alert, oriented woman in no  distress GEN: No acute distress.   Neck: No JVD, right carotid bruit Cardiac: RRR, no murmurs, rubs, or gallops.  Respiratory: Clear to auscultation bilaterally. GI: Soft, nontender, non-distended  MS: No edema; No deformity. Neuro:  Nonfocal  Psych: Normal affect   Labs    Chemistry Recent Labs  Lab 01/10/18 0428 01/10/18 0433 01/10/18 1346 01/11/18 0701  NA 139 139 138 135  K 5.1 4.6 3.1* 3.1*  CL 104 102  --  100  CO2 24  --   --  26  GLUCOSE 137* 135*  --  120*  BUN 12 17  --  9  CREATININE 0.70 0.60  --  0.53  CALCIUM 8.9  --   --  8.7*  PROT 5.4*  --   --   --   ALBUMIN 3.3*  --   --   --   AST 40  --   --   --   ALT <5  --   --   --   ALKPHOS 65  --   --   --   BILITOT 1.2  --   --   --   GFRNONAA >60  --   --  >60  GFRAA >60  --   --  >  60  ANIONGAP 11  --   --  9     Hematology Recent Labs  Lab 01/10/18 0428  01/10/18 1346 01/10/18 1930 01/11/18 0701  WBC 11.7*  --   --  16.0* 12.4*  RBC 3.81*  --   --  3.65* 3.42*  HGB 11.6*   < > 11.9* 10.9* 10.5*  HCT 35.7*   < > 35.0* 32.6* 31.2*  MCV 93.7  --   --  89.3 91.2  MCH 30.4  --   --  29.9 30.7  MCHC 32.5  --   --  33.4 33.7  RDW 13.6  --   --  14.0 14.2  PLT 267  --   --  229 201   < > = values in this interval not displayed.    Cardiac Enzymes Recent Labs  Lab 01/10/18 0428 01/10/18 1146 01/10/18 1930 01/11/18 0000  TROPONINI 0.45* 4.26* 9.12* 8.64*    Recent Labs  Lab 01/10/18 0432  TROPIPOC 0.47*     BNPNo results for input(s): BNP, PROBNP in the last 168 hours.   DDimer No results for input(s): DDIMER in the last 168 hours.   Radiology    No results found.  Cardiac Studies   2D echocardiogram 01/10/2018: Study Conclusions  - Left ventricle: The cavity size was normal. Wall thickness was   increased in a pattern of mild LVH. Basal to mid inferior   akinesis. Basal inferoseptal akinesis. The estimated ejection   fraction was 55%. Doppler parameters are consistent with  abnormal   left ventricular relaxation (grade 1 diastolic dysfunction). - Aortic valve: Trileaflet; moderately calcified leaflets. There   was mild stenosis. Mean gradient (S): 13 mm Hg. Valve area   (Vmean): 1.74 cm^2. - Mitral valve: There was trivial regurgitation. - Left atrium: The atrium was mildly dilated. - Right ventricle: The cavity size was normal. Systolic function   was normal. - Pulmonary arteries: No complete TR doppler jet so unable to   estimate PA systolic pressure. - Inferior vena cava: The vessel was normal in size. The   respirophasic diameter changes were in the normal range (>= 50%),   consistent with normal central venous pressure.  Impressions:  - Normal LV size with mild LV hypertrophy. EF 55% with basal to mid   inferior akinesis and basal inferoseptal akinesis. Normal RV size   and systolic function. Mild aortic stenosis.  Patient Profile     85 y.o. female with history of hypertension and COPD who presented with an acute inferior STEMI complicated by ventricular fibrillation cardiac arrest, treated with primary PCI.  Assessment & Plan    1.  Inferior STEMI: Patient progressing well after primary PCI.  Her LVEF is well-preserved at 55%.  We will continue her current medical program with aspirin, ticagrelor, beta-blocker, ACE inhibitor, and high intensity statin drug.  2.  Ventricular fibrillation arrest: Still with significant chest soreness.  Will check a chest x-ray to make sure there is no sternal or rib fracture present.  Continue narcotic analgesics.  Add stool softener for constipation.  3.  Essential hypertension: Blood pressure currently controlled on metoprolol and lisinopril  4.  Mixed hyperlipidemia: Continue high intensity statin drug.  5.  Right carotid bruit: Check vascular ultrasound  Disposition: Anticipate transfer to a cardiac telemetry bed today, possible home tomorrow.  Check chest x-ray and carotid ultrasound today.  Continue  current medical therapy.  For questions or updates, please contact [facility] Please consult [email or web address] for contact  info under        Signed, Tonny BollmanMichael Beverly Christiana, MD  01/11/2018, 12:14 PM

## 2018-01-11 NOTE — Care Management Note (Signed)
Case Management Note  Patient Details  Name: Beverly [name] MRN: [id number] Date of Birth: 1932-03-20  Subjective/Objective:  85 yo female presented with an acute inferior STEMI; s/p cath with PCI              Action/Plan: CM met with patient/daughter to discuss transitional needs. Patient reports living at home with her spouse, independent with ADLs PTA. PCP verified as: Sinda Du; pharmacy of choice: [facility]. Brilinta benefits check complete with est monthly cost $2.00; copay cost discussed with patient, with a Brilinta 30-day free card provided. Patient agreeable to [facility] service for her Rx. Patients family will provide transportation home. No further needs from CM.   Expected Discharge Date:                  Expected Discharge Plan:  Home/Self Care  In-House Referral:  NA  Discharge planning Services  CM Consult, Medication Assistance(Brilinta 30-day free card)  Post Acute Care Choice:  NA Choice offered to:  NA  DME Arranged:  N/A DME Agency:  NA  HH Arranged:  NA HH Agency:  NA  Status of Service:  In process, will continue to follow  If discussed at Long Length of Stay Meetings, dates discussed:    Additional Comments:  Midge Minium RN, BSN, NCM-BC, ACM-RN [phone] 01/11/2018, 1:13 PM

## 2018-01-11 NOTE — Progress Notes (Signed)
CARDIAC REHAB PHASE I   PRE:  Rate/Rhythm: 74 SR    BP: sitting 140/74    SaO2: 100 RA  MODE:  Ambulation: 190 ft   POST:  Rate/Rhythm: 90 SR    BP: sitting 173/74     SaO2: 96 RA  On my arrival pt in severe sternal/rib pain due to trying to pick something up off floor. Received pain meds and I discussed ed with her before walking. Pain somewhat decreased and she was able to walk with RW support. At baseline her ambulation is limited to mostly the house due to significant back issues however she has not been using a cane or RW at home. I suggest RW for support at home. She is agreeable. She declined CP or SOB walking, just tired. Return to recliner. We discussed MI, stent, Brilinta, diet, NTG, and CRPII. Will refer to [facility] CRPII although she is doubtful that she can do it. I encouraged her to try in a month or so since they have recumbent machines. Have not given ex gl for home yet.  [id number]  Harriet Masson CES, ACSM 01/11/2018 2:10 PM

## 2018-01-12 ENCOUNTER — Telehealth: Payer: Self-pay | Admitting: Physician Assistant

## 2018-01-12 ENCOUNTER — Other Ambulatory Visit: Payer: Self-pay | Admitting: Cardiology

## 2018-01-12 ENCOUNTER — Other Ambulatory Visit: Payer: Medicare Other

## 2018-01-12 DIAGNOSIS — J449 Chronic obstructive pulmonary disease, unspecified: Secondary | ICD-10-CM

## 2018-01-12 DIAGNOSIS — E876 Hypokalemia: Secondary | ICD-10-CM

## 2018-01-12 DIAGNOSIS — R0989 Other specified symptoms and signs involving the circulatory and respiratory systems: Secondary | ICD-10-CM

## 2018-01-12 LAB — BASIC METABOLIC PANEL
Anion gap: 11 (ref 5–15)
BUN: 11 mg/dL (ref 8–23)
CO2: 23 mmol/L (ref 22–32)
Calcium: 8.7 mg/dL — ABNORMAL LOW (ref 8.9–10.3)
Chloride: 100 mmol/L (ref 98–111)
Creatinine, Ser: 0.77 mg/dL (ref 0.44–1.00)
GFR calc Af Amer: >60 mL/min (ref >60)
Glucose, Bld: 149 mg/dL — ABNORMAL HIGH (ref 70–99)
Potassium: 3.4 mmol/L — ABNORMAL LOW (ref 3.5–5.1)
Sodium: 134 mmol/L — ABNORMAL LOW (ref 135–145)

## 2018-01-12 LAB — CBC
HCT: 27 % — ABNORMAL LOW (ref 36.0–46.0)
Hemoglobin: 8.7 g/dL — ABNORMAL LOW (ref 12.0–15.0)
MCH: 28.9 pg (ref 26.0–34.0)
MCHC: 32.2 g/dL (ref 30.0–36.0)
MCV: 89.7 fL (ref 80.0–100.0)
Platelets: 159 10*3/uL (ref 150–400)
RBC: 3.01 MIL/uL — ABNORMAL LOW (ref 3.87–5.11)
RDW: 14.1 % (ref 11.5–15.5)
WBC: 11 10*3/uL — ABNORMAL HIGH (ref 4.0–10.5)
nRBC: 0 % (ref 0.0–0.2)

## 2018-01-12 LAB — MAGNESIUM: Magnesium: 1.5 mg/dL — ABNORMAL LOW (ref 1.7–2.4)

## 2018-01-12 MED ORDER — LISINOPRIL 10 MG PO TABS
10.0000 mg | ORAL_TABLET | Freq: Every day | ORAL | Status: DC
  Administered 2018-01-12: 10 mg via ORAL
  Filled 2018-01-12: qty 1

## 2018-01-12 MED ORDER — NITROGLYCERIN 0.4 MG SL SUBL: 0.4000 mg | SUBLINGUAL_TABLET | SUBLINGUAL | 0 refills | Status: DC | PRN

## 2018-01-12 MED ORDER — ASPIRIN 81 MG PO CHEW: 81.0000 mg | CHEWABLE_TABLET | Freq: Every day | ORAL | Status: DC

## 2018-01-12 MED ORDER — LISINOPRIL 10 MG PO TABS: 10.0000 mg | ORAL_TABLET | Freq: Every day | ORAL | 4 refills | Status: DC

## 2018-01-12 MED ORDER — METOPROLOL SUCCINATE ER 25 MG PO TB24: 25.0000 mg | ORAL_TABLET | Freq: Every day | ORAL | 4 refills | Status: DC

## 2018-01-12 MED ORDER — ATORVASTATIN CALCIUM 80 MG PO TABS: 80.0000 mg | ORAL_TABLET | Freq: Every day | ORAL | 4 refills | Status: DC

## 2018-01-12 MED ORDER — TICAGRELOR 90 MG PO TABS: 90.0000 mg | ORAL_TABLET | Freq: Two times a day (BID) | ORAL | 4 refills | Status: DC

## 2018-01-12 MED ORDER — MAGNESIUM SULFATE 4 GM/100ML IV SOLN
4.0000 g | Freq: Once | INTRAVENOUS | Status: AC
  Administered 2018-01-12: 4 g via INTRAVENOUS
  Filled 2018-01-12: qty 100

## 2018-01-12 MED ORDER — POTASSIUM CHLORIDE CRYS ER 20 MEQ PO TBCR
30.0000 meq | EXTENDED_RELEASE_TABLET | ORAL | Status: AC
  Administered 2018-01-12 (×2): 30 meq via ORAL
  Filled 2018-01-12 (×2): qty 1

## 2018-01-12 MED FILL — BRILINTA 90 MG TABLET: 90 | 30 days supply | Qty: 60 | Fill #0 | Status: TO

## 2018-01-12 MED FILL — LISINOPRIL 10 MG TABS: 10 | 90 days supply | Qty: 90 | Fill #0 | Status: TO

## 2018-01-12 MED FILL — NITROGLYCERIN 0.4 MG TAB SL: 0.4 | 8 days supply | Qty: 25 | Fill #0 | Status: TO

## 2018-01-12 MED FILL — ATORVASTATIN CALCIUM 80 MG: 80 | 90 days supply | Qty: 90 | Fill #0 | Status: TO

## 2018-01-12 NOTE — Telephone Encounter (Signed)
° ° °  TOC appt 1/17 [facility], requested by Georgie Chard

## 2018-01-12 NOTE — Discharge Summary (Signed)
Discharge Summary    Patient ID: Beverly [name] MRN: [id number]; DOB: 06/15/1932  Admit date: 01/10/2018 Discharge date: 01/12/2018  Primary Care Provider: Kari BaarsHawkins, Edward, MD  Primary Cardiologist: Dr. Excell Seltzerooper, MD   Discharge Diagnoses    Principal Problem:   Acute ST elevation myocardial infarction (STEMI) of inferior wall [facility](HCC) Active Problems:   HTN (hypertension)   Cardiac arrest (HCC)   Hypokalemia   Hypomagnesemia   COPD (chronic obstructive pulmonary disease) (HCC)  Allergies Allergies  Allergen Reactions  . Penicillins Itching    DID THE REACTION INVOLVE: Swelling of the face/tongue/throat, SOB, or low BP? No Sudden or severe rash/hives, skin peeling, or the inside of the mouth or nose? No Did it require medical treatment? No When did it last happen? Unknown If all above answers are "NO", may proceed with cephalosporin use.    Diagnostic Studies/Procedures    Cardiac catheterization 01/10/2018:   Mid RCA lesion is 99% stenosed.  A drug-eluting stent was successfully placed.  Post intervention, there is a 0% residual stenosis.   Beverly AmericanJean C Dutko is a 85 y.o. female  Successful PCI and drug-eluting stenting of a occluded dominant RCA with faint left-to-right collaterals.  Vessel opened with injection and was subtotally occluded with visible thrombus.  The door to balloon time was 27 minutes.  The patient remained hemodynamically stable throughout the case on IV epinephrine and amiodarone.  Angiomax will continue for 4 hours full dose.  The sheath was then removed be removed and pressure held.  Her enzymes will be cycled.  2D echo has been ordered.  She will be treated with routine post MI pharmacology including high-dose statin drug, beta-blocker depending on her blood pressure.  She can probably be "fast tracked" given her normal left system.  She left the lab in stable condition.  Echocardiogram 01/10/2018: Study Conclusions  - Left ventricle: The cavity size  was normal. Wall thickness was   increased in a pattern of mild LVH. Basal to mid inferior   akinesis. Basal inferoseptal akinesis. The estimated ejection   fraction was 55%. Doppler parameters are consistent with abnormal   left ventricular relaxation (grade 1 diastolic dysfunction). - Aortic valve: Trileaflet; moderately calcified leaflets. There   was mild stenosis. Mean gradient (S): 13 mm Hg. Valve area   (Vmean): 1.74 cm^2. - Mitral valve: There was trivial regurgitation. - Left atrium: The atrium was mildly dilated. - Right ventricle: The cavity size was normal. Systolic function   was normal. - Pulmonary arteries: No complete TR doppler jet so unable to   estimate PA systolic pressure. - Inferior vena cava: The vessel was normal in size. The   respirophasic diameter changes were in the normal range (>= 50%),   consistent with normal central venous pressure.  Impressions:  - Normal LV size with mild LV hypertrophy. EF 55% with basal to mid   inferior akinesis and basal inferoseptal akinesis. Normal RV size   and systolic function. Mild aortic stenosis.   History of Present Illness     Ms. Beverly [name] is a pleasant 85yo F with a prior hx of hypertension and COPD who [facility] in acute distress. She presented with severe central chest pain. She reported that she was awoken from sleep with severe chest discomfort with radiation into her neck and jaw.  Initially she thought it was related to her typical chronic arthritic discomfort however taking pain medicines did not improve her symptoms. She reported associated diaphoresis. Given that her symptoms  did not improve, EMS was called for transport to the ED for further evaluation.   In the EMS truck she was found to have inferior ST elevations which were transmitted to the ER and a code STEMI was called initially.  EKG changes stated started to reduce and interventional took a look at the imaging ECGs and code STEMI was  canceled. She receive a dose of nitroglycerin in the EMS truck and chest pain resolved however she became extremely hypotensive with BP's in the 80's systolic from hypertensive blood pressure.  General cardiology was consulted to evaluate patient and in the ER initially patient looked well was not complaining of any chest pain.  ST elevations had resolved and she was in sinus rhythm without issue.  After cardiology evaluation a bedside, she began to develop chest pain and ST elevations in the inferior leads were noted. She had been initiated on heparin and given beta-blocker with the hope that this might suppress symptoms however symptoms persisted and worsened and patient had chest pain at the level 5 out of 10.  Given these findings after discussion with interventional was plan was to proceed with cath.  While awaiting Cath Lab team patient had episode of likely ventricular tachycardia polymorphic in nature with heart rates in the 160s in which she lost her pulse and was unresponsive and required CPR and 2 or 3 defibrillator shocks. She received a bolus of amiodarone and was put on amiodarone drip.  After about a 5-minute code this resolved and patient was back to her baseline was alert oriented able to protect her airway.   Hospital Course   On 01/10/2018 she was taken emergently to the cath lab in which a successful PCI/DES was performed to the [facility] with a 99% stenosed lesion. Echocardiogram performed 01/10/2018 with basal to mid infeThe estimated ejection 55% and G 1DD.   Patient progressing well after primary PCI. We will continue her current medical program with aspirin, ticagrelor, beta-blocker, ACE inhibitor, and high intensity statin medications.   Continues to have significant chest soreness. CXR completed with no acute changes or fractures. PT consulted prior to d/c.    Other hospital problems include:  -Essential hypertension: Blood pressure currently controlled on metoprolol and  lisinopril  -Mixed hyperlipidemia: Continue high intensity statin drug.  -Right carotid bruit: I have ordered for her to have vascular ultrasound within the next several weeks for asymptomatic bruit. Please have this scheduled   -Hypokalemia and hypomagnesemia: On day of discharge, K+ found to be 3.4 anf Mg+ at 1.5>>>replete with 4gm Mg and x2 of Kdur  Consultants: None   The patient was seen and examined by Dr. Excell Seltzer who feels that she is stable and ready for discharge today, 01/12/2018.   _____________  Discharge Vitals Blood pressure (!) 160/72, pulse 96, temperature 98 F (36.7 C), temperature source Oral, resp. rate 18, height 5\' 1"  (1.549 m), weight 51.3 kg, SpO2 99 %.  Filed Weights   01/10/18 0432 01/11/18 0500 01/12/18 0439  Weight: 52.2 kg 53 kg 51.3 kg   Labs & Radiologic Studies    CBC Recent Labs    01/10/18 0428  01/11/18 0701 01/12/18 0456  WBC 11.7*   < > 12.4* 11.0*  NEUTROABS 3.0  --   --   --   HGB 11.6*   < > 10.5* 8.7*  HCT 35.7*   < > 31.2* 27.0*  MCV 93.7   < > 91.2 89.7  PLT 267   < > 201  159   < > = values in this interval not displayed.   Basic Metabolic Panel Recent Labs    16/24/46 0428  01/11/18 0701 01/12/18 0947  NA 139   < > 135 134*  K 5.1   < > 3.1* 3.4*  CL 104   < > 100 100  CO2 24  --  26 23  GLUCOSE 137*   < > 120* 149*  BUN 12   < > 9 11  CREATININE 0.70   < > 0.53 0.77  CALCIUM 8.9  --  8.7* 8.7*  MG 1.7  --   --  1.5*   < > = values in this interval not displayed.   Liver Function Tests Recent Labs    01/10/18 0428  AST 40  ALT <5  ALKPHOS 65  BILITOT 1.2  PROT 5.4*  ALBUMIN 3.3*   Cardiac Enzymes Recent Labs    01/10/18 1146 01/10/18 1930 01/11/18 0000  TROPONINI 4.26* 9.12* 8.64*   Fasting Lipid Panel Recent Labs    01/10/18 0428  CHOL 151  HDL 82  LDLCALC 61  TRIG 39  CHOLHDL 1.8   ____________  Dg Chest 1 View  Result Date: 01/11/2018 CLINICAL DATA:  Status post CPR EXAM: CHEST   1 VIEW COMPARISON:  Film from earlier in the same day. FINDINGS: Hiatal hernia is noted. Degenerative changes of the thoracic spine are seen. No definitive sternal fracture is seen. No other focal abnormality is noted. IMPRESSION: No acute abnormality following CPR. Electronically Signed   By: Alcide Clever M.D.   On: 01/11/2018 19:32   Dg Chest Port 1 View  Result Date: 01/11/2018 CLINICAL DATA:  Chest pain. EXAM: PORTABLE CHEST 1 VIEW COMPARISON:  None. FINDINGS: Cardiomediastinal silhouette is normal. Mediastinal contours appear intact. Mild calcific atherosclerotic disease and tortuosity of the aorta. Possible hiatal hernia. There is no evidence of focal airspace consolidation, pleural effusion or pneumothorax. Osseous structures are without acute abnormality. Soft tissues are grossly normal. IMPRESSION: No active disease. Tortuosity and calcific atherosclerotic disease of the aorta. Possible hiatal hernia. Electronically Signed   By: Ted Mcalpine M.D.   On: 01/11/2018 16:56   Disposition   Pt is being discharged home today in good condition.  Follow-up Plans & Appointments   Follow-up Information    [facility]-Home Follow up.   Specialty:  Home Health Services Why:  Physical Therapy Contact information: [address] [place] [address] [phone]        [facility] - Dme Follow up.   Why:  Dispensing optician information: [address] [place] [address] [phone]        Laurann Montana, PA-C Follow up on 01/19/2018.   Specialties:  Cardiology, Radiology Why:  Your follow up appointment is on 01//17/2020 at 1130am. Please arrive to your appointment at 1115am.  Contact information: [address] [place] [address] [phone]          Discharge Instructions    Amb Referral to Cardiac Rehabilitation   Complete by:  As directed    Diagnosis:   Coronary Stents STEMI PTCA        Discharge Medications   Allergies as of 01/12/2018      Reactions   Penicillins Itching   DID THE REACTION INVOLVE: Swelling of the face/tongue/throat, SOB, or low BP? No Sudden or severe rash/hives, skin peeling, or the inside of the mouth or nose? No Did  it require medical treatment? No When did it last happen? Unknown If all above answers are "NO", may proceed with cephalosporin use.      Medication List    TAKE these medications   ALPRAZolam 0.5 MG tablet Commonly known as:  XANAX Take 0.5 mg by mouth 3 (three) times daily as needed for anxiety.   aspirin 81 MG chewable tablet Chew 1 tablet (81 mg total) by mouth daily. Start taking on:  January 13, 2018   atorvastatin 80 MG tablet Commonly known as:  LIPITOR Take 1 tablet (80 mg total) by mouth daily at 6 PM.   celecoxib 200 MG capsule Commonly known as:  CELEBREX Take 200 mg by mouth daily.   cholecalciferol 1000 units tablet Commonly known as:  VITAMIN D Take 1,000 Units by mouth daily.   fluticasone 50 MCG/ACT nasal spray Commonly known as:  FLONASE Place 1 spray into both nostrils daily.   HYDROcodone-acetaminophen 7.5-325 MG tablet Commonly known as:  NORCO Take 1 tablet by mouth every 6 (six) hours as needed for pain.   levETIRAcetam 500 MG tablet Commonly known as:  KEPPRA TAKE ONE TABLET TWICE DAILY   lisinopril 10 MG tablet Commonly known as:  PRINIVIL,ZESTRIL Take 1 tablet (10 mg total) by mouth daily. Start taking on:  January 13, 2018   methocarbamol 750 MG tablet Commonly known as:  ROBAXIN Take 750 mg by mouth 2 (two) times daily.   metoprolol succinate 25 MG 24 hr tablet Commonly known as:  TOPROL-XL Take 1 tablet (25 mg total) by mouth daily. Start taking on:  January 13, 2018 What changed:    medication strength  additional instructions   nitroGLYCERIN 0.4 MG SL tablet Commonly known as:  NITROSTAT Place 1 tablet (0.4 mg total) under the tongue every 5 (five) minutes x 3  doses as needed for chest pain.   pantoprazole 40 MG tablet Commonly known as:  PROTONIX Take 40 mg by mouth daily.   potassium chloride 10 MEQ tablet Commonly known as:  K-DUR Take 10 mEq by mouth daily.   PRESERVISION/LUTEIN PO Take 1 tablet by mouth 2 (two) times daily.   SYSTANE BALANCE OP Place 1-2 drops into both eyes daily as needed (dry eyes).   ticagrelor 90 MG Tabs tablet Commonly known as:  BRILINTA Take 1 tablet (90 mg total) by mouth 2 (two) times daily.            Durable Medical Equipment  (From admission, onward)         Start     Ordered   01/12/18 1306  For home use only DME Walker rolling  Once    Question:  Patient needs a walker to treat with the following condition  Answer:  Physical deconditioning   01/12/18 1306           Acute coronary syndrome (MI, NSTEMI, STEMI, etc) this admission?: Yes.     AHA/ACC Clinical Performance & Quality Measures: 1. Aspirin prescribed? - Yes 2. ADP Receptor Inhibitor (Plavix/Clopidogrel, Brilinta/Ticagrelor or Effient/Prasugrel) prescribed (includes medically managed patients)? - Yes 3. Beta Blocker prescribed? - Yes 4. High Intensity Statin (Lipitor 40-80mg  or Crestor 20-40mg ) prescribed? - Yes 5. EF assessed during THIS hospitalization? - Yes 6. For EF <40%, was ACEI/ARB prescribed? - Not Applicable (EF >/= 40%) 7. For EF <40%, Aldosterone Antagonist (Spironolactone or Eplerenone) prescribed? - Not Applicable (EF >/= 40%) 8. Cardiac Rehab Phase II ordered (Included Medically managed Patients)? - Yes    Outstanding Labs/Studies  Will need BMET at follow up to assess for K+ and Mg levels   Duration of Discharge Encounter   Greater than 30 minutes including physician time.  Signed, Georgie Chard, NP 01/12/2018, 3:22 PM

## 2018-01-12 NOTE — Progress Notes (Signed)
CARDIAC REHAB PHASE I   PRE:  Rate/Rhythm: 87 SR    BP: sitting 124/73    SaO2:   MODE:  Ambulation: 100 ft   POST:  Rate/Rhythm: 116 ST max, 105 at end    BP: sitting 130/61     SaO2: 98 RA  Pt moved to EOB independently but needed assist to stand. Used RW and ambulated to sink and washed hands but pt noted to be unsteady. I applied gait belt. Pt with increased gait disturbance today. Struggled to push RW appropriately and place feet appropriately in RW. Needed reminders to stand taller/support herself. She has chronic back issues but seemed to be more of an issue today than yesterday. ? Maybe RW was heavier today. Fatigued quickly although did not have any specific c/o. Decreased distance. Pt was distracted at times by lines instead of focusing on supporting herself. RN sts she recently got pain meds and xanax however pt and daughter st that is her normal meds at home. Suggest PT to eval plan for d/c. Pt is thinking about rollator versus RW. At baseline she ambulates in house and cares for husband.   I reviewed ed with pt and daughter. Encouraged CRPII again as the seated exercise would be helpful. [id number]  Harriet Masson CES, ACSM 01/12/2018 11:21 AM

## 2018-01-12 NOTE — Discharge Instructions (Signed)
Cardiac Rehabilitation What is cardiac rehabilitation? Cardiac rehabilitation is a treatment program that helps improve the health and well-being of people who have heart problems. Cardiac rehabilitation includes exercise training, education, and counseling to help you get stronger and return to an active lifestyle. This program can help you get better faster and reduce any future hospital stays. Why might I need cardiac rehabilitation? Cardiac rehabilitation programs can help when you have or have had:  A heart attack.  Heart failure.  Peripheral artery disease.  Coronary artery disease.  Angina.  Lung or breathing problems. Cardiac rehabilitation programs are also used when you have had:  Coronary artery bypass graft surgery.  Heart valve replacement.  Heart stent placement.  Heart transplant.  Aneurysm repair. What are the benefits of cardiac rehabilitation? Cardiac rehabilitation can help:  Reduce problems like chest pain and trouble breathing.  Change risk factors that contribute to heart disease, such as: ? Smoking. ? High blood pressure. ? High cholesterol. ? Diabetes. ? Being out of shape or not active. ? Weighing more than 30% higher than your ideal weight. ? Diet.  Improve your mental outlook so you feel: ? More hopeful. ? Better about yourself. ? More confident about taking care of yourself.  Get support from health experts as well as other people with similar problems.  Learn how to manage and understand your medicines.  Teach your family about your condition and how to participate in your recovery. What happens in cardiac rehabilitation? You will be assessed by a cardiac rehabilitation team. They will check your health history and do a physical exam. You may need blood tests, stress tests, and other evaluations to make sure that you are ready to start cardiac rehabilitation. The cardiac rehabilitation team works with you to make a plan based on your  health and goals. Your program will be tailored to fit you and your needs and may change as you progress. You may work with a health care team that includes:  Doctors.  Nurses.  Dietitians.  Psychologists.  Exercise specialists.  Physical and occupational therapists. What are the phases of cardiac rehabilitation? A cardiac rehabilitation program is often divided into phases. You advance from one phase to the next. Phase One  This phase starts while you are still in the hospital. You may start by walking in your room and then in the hall. You may start some simple exercises with a therapist. Phase Two  This phase begins when you go home or to another facility. This phase may last 8-12 weeks. You will travel to a cardiac rehabilitation center or another place where rehabilitation is offered. You will slowly increase your activity level while being closely watched by a nurse or therapist. Exercises may include a combination of strength or resistance training and cardio or aerobic movement on a treadmill or other machines. Your condition will determine how often and how long these sessions last. In phase two, you may learn how to cook healthy meals, control your blood sugar, and manage your medicines. You may need help with scheduling or planning how and when to take your medicines. If you have questions about your medicines, it is very important that you talk to your health care provider. Phase Three This phase continues for the rest of your life. There will be less supervision. You may still participate in cardiac rehabilitation activities or become part of a group in your community. You may benefit from talking about your experience with other people who are facing similar  challenges. Get help right away if:  You have severe chest discomfort, especially if the pain is crushing or pressure-like and spreads to your arms, back, neck, or jaw. Do not wait to see if the pain will go away.  You  have weakness or numbness in your face, arms, or legs, especially on one side of the body.  Your speech is slurred.  You are confused.  You have a sudden severe headache or loss of vision.  You have shortness of breath.  You are sweating and have nausea.  You feel dizzy or faint.  You are fatigued. These symptoms may represent a serious problem that is an emergency. Do not wait to see if the symptoms will go away. Get medical help right away. Call your local emergency services (911 in the [place] Do not drive yourself to the hospital. This information is not intended to replace advice given to you by your health care provider. Make sure you discuss any questions you have with your health care provider. Document Released: 09/29/2007 Document Revised: 09/12/2017 Document Reviewed: 11/03/2014 Elsevier Interactive Patient Education  2019 [employer] Inc[employer]   Heart Attack Your heart is a muscle and needs oxygen to survive. A heart attack (myocardial infarction, MI) is a condition that occurs when your heart does not get enough oxygen, and the heart muscle begins to die (ischemia). This can cause permanent damage if not treated right away. A heart attack is a medical emergency. Heart attack is also known as acute coronary syndrome (ACS). ACS is a term used to describe a group of conditions that affect blood flow to the heart. What are the causes? This condition can be caused by:  Atherosclerosis. This is when a fatty substance (plaque) gradually builds up in the blood vessels that flow to the heart (coronaryarteries). This buildup can block or reduce blood supply to one or more of the coronary arteries.  A blood clot. A blood clot can develop suddenly when plaque breaks up (ruptures) within a coronary artery, or it can travel to the heart from another area of the body. The blood clot blocks the artery and prevents blood flow to the heart.  Low blood pressure (hypotension).  An abnormal heart  beat (arrhythmia).  Medical conditions that cause a decrease of oxygen to the heart, such as anemiaorrespiratory failure (oxygen mismatch).  Severe tightening (spasm) of a blood vessel that cuts off blood flow to the heart.  Tearing of a coronary artery (spontaneous coronary artery dissection).  Certain heart procedures. What increases the risk? The following factors may make you more likely to develop this condition:  Age. The older a person is, the higher the risk.  Personal or family history of chest pain, heart attack, peripheral artery disease, or stroke.  Being female.  Smoking.  Lack of exercise.  Overweight or obesity.  High blood pressure (hypertension).  High cholesterol.  Diabetes.  Drinking too much alcohol.  Using drugs, such as cocaine or methamphetamine. What are the signs or symptoms? Symptoms of this condition may vary, depending on factors like gender and age. Symptoms may include:  Chest pain. It may feel like: ? Crushing or squeezing. ? Tightness, pressure, fullness, or heaviness.  Pain in the arm, neck, jaw, back, or upper body.  Shortness of breath.  Heartburn or indigestion.  Nausea.  Sudden cold sweats.  Feeling tired.  Sudden lightheadedness. How is this diagnosed? This condition may be diagnosed through tests, such as:  Electrocardiogram (ECG) to measure the electrical activity  of your heart.  Blood tests to check for chemicals released by damaged heart muscle (cardiac markers).  Coronary angiogram to evaluate blood flow and heart function.  CT scan to see the heart more clearly.  Echocardiogram to evaluate heart motion and blood flow. How is this treated? A heart attack must be treated as soon as possible. Treatment may include:  Medicines to: ? Break up or dissolve blood clots (fibrinolytic therapy). ? Thin blood and to help prevent blood clots. ? Treat blood pressure. ? Improve blood flow to the heart. ? Reduce  pain. ? Reduce cholesterol.  Procedures to widen a blocked artery and keep it open (angioplasty and stent placement).  Open heart surgery (coronary artery bypass graft, CABG). This enables blood to flow to the heart by going around (bypassing) the damaged part of the artery.  Oxygen therapy if needed.  Cardiac rehabilitation. This improves your health and well-being through exercise training, education, and counseling. Follow these instructions at home: Medicines  Take over-the-counter and prescription medicines only as told by your health care provider.  Do not take the following medicines unless your health care provider says it is okay to take them: ? NSAIDs. ? Supplements that contain vitamin A, vitamin E, or both. ? Hormone replacement therapy that contains estrogen with or without progestin. Lifestyle  Do not use any products that contain nicotine or tobacco, such as cigarettes and e-cigarettes. If you need help quitting, ask your health care provider.  Avoid secondhand smoke.  Exercise regularly. Ask your health care provider about participating in a cardiac rehabilitation program that helps you start exercising safely after a heart attack.  Eat a heart-healthy diet. Your health care provider will tell you what foods to eat.  Maintain a healthy weight.  Learn ways to manage stress.  Do not use illegal drugs. Alcohol use  Do not drink alcohol if: ? Your health care provider tells you not to drink. ? You are pregnant, may be pregnant, or are planning to become pregnant.  If you drink alcohol, limit how much you have: ? 0-1 drink a day for women. ? 0-2 drinks a day for men.  Be aware of how much alcohol is in your drink. In the [place] one drink equals one typical bottle of beer (12 oz), one-half glass of wine (5 oz), or one shot of hard liquor (1 oz). General instructions  Work with your health care provider to manage any other conditions you have, such as  hypertension or diabetes. These conditions affect your heart.  Get screened for depression, and seek treatment if needed.  Keep your vaccinations up to date. Get the flu (influenza) vaccine every year.  Keep all follow-up visits as told by your health care provider. This is important. Contact a health care provider if:  You feel overwhelmed or sad.  You have trouble doing your daily activities. Get help right away if you:  Have sudden, unexplained discomfort in your chest, arms, back, neck, jaw, or upper body.  Have shortness of breath.  Suddenly start to sweat or your skin gets clammy.  Feel nauseous or vomit.  Have unexplained fatigue or weakness.  Suddenly feel lightheaded or dizzy.  Notice your heart starts to beat fast or feels like it is skipping beats.  Have blood pressure that is higher than 180/120. These symptoms may represent a serious problem that is an emergency. Do not wait to see if the symptoms will go away. Get medical help right away. Call your local  emergency services (911 in the [place] Do not drive yourself to the hospital. Summary  A heart attack (myocardial infarction, MI) is a condition that occurs when an artery in the heart (coronary artery) becomes narrowed or blocked. The narrowing or blockage cuts off the blood and oxygen supply to the heart, which can permanently damage the heart.  A heart attack is an emergency. Get help right away if you have sudden pain in your chest, arms, back, jaw, or upper body. Seek help if you have nausea or you vomit, or you become lightheaded or dizzy.  Treatment is a combination of medicines and procedures, if needed, to open the blocked artery and restore blood flow to the heart. This information is not intended to replace advice given to you by your health care provider. Make sure you discuss any questions you have with your health care provider. Document Released: 12/20/2004 Document Revised: 02/03/2017 Document  Reviewed: 02/03/2017 Elsevier Interactive Patient Education  2019 [employer].

## 2018-01-12 NOTE — Care Management Note (Signed)
Case Management Note  Patient Details  Name: Beverly [name] MRN: [id number] Date of Birth: August 13, 1932  Subjective/Objective: Pt presented for Acute Inferior Stemi- sp cath with PCI.                    Action/Plan: PT worked with patient and recommended HH PT services- Pt is agreeable to services. Pt has utilized [facility] in the past and wants to use them again for [employer] and DME RW. Referral called to [facility] with [facility] and SOC to begin within 24-48 hours post transition home. DME RW will be delivered to room. No further needs from CM at this time.   Expected Discharge Date:                  Expected Discharge Plan:  Home w Home Health Services  In-House Referral:  NA  Discharge planning Services  CM Consult, Medication Assistance(Brilinta 30-day free card)  Post Acute Care Choice:  Durable Medical Equipment, Home Health Choice offered to:  Patient  DME Arranged:  Walker rolling DME Agency:  [facility]  HH Arranged:  PT [facility] Agency:  [facility]  Status of Service:  Completed, signed off  If discussed at Long Length of Stay Meetings, dates discussed:    Additional Comments:  Gala Lewandowsky, RN 01/12/2018, 1:01 PM

## 2018-01-12 NOTE — Consult Note (Signed)
            [facility] CM Primary Care Navigator  01/12/2018  Beverly [name] 03-12-1932 [id number]   Seenpatient and daughter Beverly [name]) the bedside to identify possible discharge needs.  Patient reports having "diaphoresis and feeling not right" on the day of admission. She presented to [facility] in acute distress with severe central chest pain and was found to have inferior ST elevations and a code STEMI was called. (acute ST elevation myocardial infarction (STEMI) of inferior wall, hypertension, cardiac arrest, hypokalemia, hypomagnesemia, COPD)   Patient endorsesDr. Kari [name] with [name]. [facility] clinic as herprimary care provider.   [place] [facility] [place].   Patientstatesthatshe has beenmanagingher own medications at [facility] use of "pill box" filledeach night for the following day's dose.   Daughterverbalizedthat she has been driving and providing transportationtopatient's doctors' appointments.  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 Daughter and her husband Beverly [name]) will be moving in to stay with patient and husband. Patient's son Beverly [name]) will be working from his parent's home to be able to look after their needs.   Anticipated discharge plan ishomewith home health services per patient.  Patientand daughter voiced understanding to call primary care provider's office for post discharge follow-up appointment within1- 2 weeks orsooner if needs arise. Patient letter (with PCP's contact number) wasprovided asa reminder.    Discussed with patientand daughter regarding [facility] services available for health management andresourcesat home butshe verbalized managing well so far, just needed to list down blood pressure readings and bring to her doctors' appointments for evaluation.  Daughter states that patient is a retired [employer] fund[employer] and is knowledgeable of ways to manage health conditions with children's assistance. Both denied current needs orconcernsfor now.  Patient and daughter are aware to seekreferral from primary care provider to [facility] care management ifdeemed necessary and appropriate for any services in thenearfuture.  [facility] care management information was provided for futureneeds that patientmay have.  However,patient verbally agreedand optedfor EMMI calls to follow-up withherrecovery at home.   Referral made for [facility] calls after discharge.    For additional questions please contact:  Karin Golden A. Aashish Hamm, BSN, RN-BC [facility] PRIMARY CARE Navigator Cell: [phone]

## 2018-01-12 NOTE — Evaluation (Signed)
Physical Therapy Evaluation & Discharge Patient Details Name: Beverly [name] MRN: [id number] DOB: 19-Jul-1932 Today's Date: 01/12/2018   History of Present Illness  Pt is an 85 y.o. female admitted 01/10/18 with inferior STEMI; in ED, pt with VF arrest requiring CPR and defibrilattion. S/p urgent cath for place stent placement. PMH includes HTN, COPD, CAD, arthritis, hearing loss.    Clinical Impression  Patient evaluated by Physical Therapy with no further acute PT needs identified. PTA, pt indep and lives with husband who she cares for; family plans to move in with pt to provide increased assist for her and husband. Today, pt ambulatory with RW at supervision-level; encouraged continued use of RW for stability. Educ on fall risk reduction and energy conservation strategies. All education has been completed and the patient has no further questions. PT is signing off. Thank you for this referral.    Follow Up Recommendations Home health PT;Supervision for mobility/OOB    Equipment Recommendations  Rolling walker with 5" wheels    Recommendations for Other Services       Precautions / Restrictions Precautions Precautions: Fall Restrictions Weight Bearing Restrictions: No      Mobility  Bed Mobility               General bed mobility comments: Received asleep, sitting in recliner  Transfers Overall transfer level: Needs assistance Equipment used: Rolling walker (2 wheeled) Transfers: Sit to/from Stand Sit to Stand: Supervision         General transfer comment: Increased time and effort  Ambulation/Gait Ambulation/Gait assistance: Min guard;Supervision Gait Distance (Feet): 100 Feet Assistive device: Rolling walker (2 wheeled) Gait Pattern/deviations: Step-through pattern;Decreased stride length;Trunk flexed Gait velocity: Decreased Gait velocity interpretation: 1.31 - 2.62 ft/sec, indicative of limited community ambulator General Gait Details: Slow, steady gait with  RW and min guard, progressing to supervision for safety; pt with chronic forward flexion secondary to lower back pain  Stairs            Wheelchair Mobility    Modified Rankin (Stroke Patients Only)       Balance Overall balance assessment: Needs assistance   Sitting balance-Leahy Scale: Good       Standing balance-Leahy Scale: Fair Standing balance comment: Can static stand without UE support; dynamic stability improved with RW                             Pertinent Vitals/Pain Pain Assessment: Faces Faces Pain Scale: Hurts little more Pain Location: Chest post-CPR Pain Descriptors / Indicators: Sore;Grimacing;Guarding Pain Intervention(s): Monitored during session;Limited activity within patient's tolerance    Home Living Family/patient expects to be discharged to:: Private residence Living Arrangements: Spouse/significant other Available Help at Discharge: Family;Available 24 hours/day Type of Home: House Home Access: Stairs to enter Entrance Stairs-Rails: Right Entrance Stairs-Number of Steps: 3 Home Layout: One level   Additional Comments: Daughter planning to move in with pt to provide assist for her and pt's husband    Prior Function Level of Independence: Independent         Comments: Ambulatory without device. Caregiver for husband     Hand Dominance        Extremity/Trunk Assessment   Upper Extremity Assessment Upper Extremity Assessment: Generalized weakness    Lower Extremity Assessment Lower Extremity Assessment: Generalized weakness    Cervical / Trunk Assessment Cervical / Trunk Assessment: Kyphotic(h/o chronic back issues)  Communication      Cognition Arousal/Alertness: Awake/alert  Behavior During Therapy: WFL for tasks assessed/performed Overall Cognitive Status: Within Functional Limits for tasks assessed                                 General Comments: WFL for simple tasks      General  Comments General comments (skin integrity, edema, etc.): Daughter present and supportive    Exercises     Assessment/Plan    PT Assessment All further PT needs can be met in the next venue of care  PT Problem List Decreased strength;Decreased activity tolerance;Decreased balance;Decreased mobility;Decreased knowledge of use of DME       PT Treatment Interventions      PT Goals (Current goals can be found in the Care Plan section)  Acute Rehab PT Goals Patient Stated Goal: Home today PT Goal Formulation: All assessment and education complete, DC therapy    Frequency     Barriers to discharge        Co-evaluation               AM-PAC PT "6 Clicks" Mobility  Outcome Measure Help needed turning from your back to your side while in a flat bed without using bedrails?: A Little Help needed moving from lying on your back to sitting on the side of a flat bed without using bedrails?: A Little Help needed moving to and from a bed to a chair (including a wheelchair)?: A Little Help needed standing up from a chair using your arms (e.g., wheelchair or bedside chair)?: A Little Help needed to walk in hospital room?: A Little Help needed climbing 3-5 steps with a railing? : A Little 6 Click Score: 18    End of Session Equipment Utilized During Treatment: Gait belt Activity Tolerance: Patient tolerated treatment well;Patient limited by fatigue Patient left: in chair;with call bell/phone within reach;with family/visitor present Nurse Communication: Mobility status PT Visit Diagnosis: Other abnormalities of gait and mobility (R26.89)    Time: 6154-8845 PT Time Calculation (min) (ACUTE ONLY): 19 min   Charges:   PT Evaluation $PT Eval Moderate Complexity: [place], PT, DPT Acute Rehabilitation Services  Pager [phone] Office [place] 01/12/2018, 12:25 PM

## 2018-01-12 NOTE — Progress Notes (Addendum)
Progress Note  Patient Name: Beverly [name] Date of Encounter: 01/12/2018  Primary Cardiologist: Dr. Excell Seltzer, MD   Subjective   Continues to have mid sternal chest tightness s/p CPR. Denies SOB.   Inpatient Medications    Scheduled Meds: . aspirin  81 mg Oral Daily  . atorvastatin  80 mg Oral q1800  . Chlorhexidine Gluconate Cloth  6 each Topical Q0600  . docusate sodium  100 mg Oral Daily  . enoxaparin (LOVENOX) injection  40 mg Subcutaneous Q24H  . fluticasone  1 spray Each Nare Daily  . levETIRAcetam  500 mg Oral BID  . lisinopril  5 mg Oral Daily  . methocarbamol  750 mg Oral BID  . metoprolol succinate  25 mg Oral Daily  . multivitamin  1 tablet Oral Daily  . mupirocin ointment  1 application Nasal BID  . pantoprazole  40 mg Oral Daily  . potassium chloride  10 mEq Oral Daily  . sodium chloride flush  3 mL Intravenous Q12H  . ticagrelor  90 mg Oral BID   Continuous Infusions: . sodium chloride    . epinephrine 5 mcg/min (01/10/18 0620)   PRN Meds: sodium chloride, acetaminophen, ALPRAZolam, fentaNYL (SUBLIMAZE) injection, HYDROcodone-acetaminophen, nitroGLYCERIN, ondansetron (ZOFRAN) IV, polyvinyl alcohol, sodium chloride flush   Vital Signs    Vitals:   01/11/18 1738 01/11/18 1957 01/12/18 0325 01/12/18 0439  BP: (!) 142/91 (!) 143/76 (!) 160/72   Pulse: 79 85 96   Resp: 19 16 18    Temp: 98.2 F (36.8 C) 98.5 F (36.9 C) 98 F (36.7 C)   TempSrc: Oral Oral Oral   SpO2: 100% 99% 99%   Weight:    51.3 kg  Height:        Intake/Output Summary (Last 24 hours) at 01/12/2018 0824 Last data filed at 01/12/2018 0600 Gross per 24 hour  Intake 0 ml  Output -  Net 0 ml   Filed Weights   01/10/18 0432 01/11/18 0500 01/12/18 0439  Weight: 52.2 kg 53 kg 51.3 kg   Physical Exam   General: Frail, elderly, NAD Skin: Warm, dry, intact  Head: Normocephalic, atraumatic, clear, moist mucus membranes. Neck: Negative for carotid bruits. No JVD Lungs:  Diminished in upper and lower lobes. No wheezes, rales, or rhonchi. Breathing is unlabored. Cardiovascular: RRR with S1 S2. No murmurs, rubs, gallops, or LV heave appreciated. Abdomen: Soft, non-tender, non-distended with normoactive bowel sounds. No hepatomegaly, No rebound/guarding. No obvious abdominal masses. MSK: Strength and tone appear normal for age. 5/5 in all extremities Extremities: No edema. No clubbing or cyanosis. DP/PT pulses 2+ bilaterally Neuro: Alert and oriented. No focal deficits. No facial asymmetry. MAE spontaneously. Psych: Responds to questions appropriately with normal affect.    Labs    Chemistry Recent Labs  Lab 01/10/18 0428 01/10/18 0433 01/10/18 1346 01/11/18 0701  NA 139 139 138 135  K 5.1 4.6 3.1* 3.1*  CL 104 102  --  100  CO2 24  --   --  26  GLUCOSE 137* 135*  --  120*  BUN 12 17  --  9  CREATININE 0.70 0.60  --  0.53  CALCIUM 8.9  --   --  8.7*  PROT 5.4*  --   --   --   ALBUMIN 3.3*  --   --   --   AST 40  --   --   --   ALT <5  --   --   --  ALKPHOS 65  --   --   --   BILITOT 1.2  --   --   --   GFRNONAA >60  --   --  >60  GFRAA >60  --   --  >60  ANIONGAP 11  --   --  9     Hematology Recent Labs  Lab 01/10/18 1930 01/11/18 0701 01/12/18 0456  WBC 16.0* 12.4* 11.0*  RBC 3.65* 3.42* 3.01*  HGB 10.9* 10.5* 8.7*  HCT 32.6* 31.2* 27.0*  MCV 89.3 91.2 89.7  MCH 29.9 30.7 28.9  MCHC 33.4 33.7 32.2  RDW 14.0 14.2 14.1  PLT 229 201 159    Cardiac Enzymes Recent Labs  Lab 01/10/18 0428 01/10/18 1146 01/10/18 1930 01/11/18 0000  TROPONINI 0.45* 4.26* 9.12* 8.64*    Recent Labs  Lab 01/10/18 0432  TROPIPOC 0.47*     BNPNo results for input(s): BNP, PROBNP in the last 168 hours.   DDimer No results for input(s): DDIMER in the last 168 hours.   Radiology    Dg Chest 1 View  Result Date: 01/11/2018 CLINICAL DATA:  Status post CPR EXAM: CHEST  1 VIEW COMPARISON:  Film from earlier in the same day. FINDINGS: Hiatal  hernia is noted. Degenerative changes of the thoracic spine are seen. No definitive sternal fracture is seen. No other focal abnormality is noted. IMPRESSION: No acute abnormality following CPR. Electronically Signed   By: Alcide CleverMark  Lukens M.D.   On: 01/11/2018 19:32   Dg Chest Port 1 View  Result Date: 01/11/2018 CLINICAL DATA:  Chest pain. EXAM: PORTABLE CHEST 1 VIEW COMPARISON:  None. FINDINGS: Cardiomediastinal silhouette is normal. Mediastinal contours appear intact. Mild calcific atherosclerotic disease and tortuosity of the aorta. Possible hiatal hernia. There is no evidence of focal airspace consolidation, pleural effusion or pneumothorax. Osseous structures are without acute abnormality. Soft tissues are grossly normal. IMPRESSION: No active disease. Tortuosity and calcific atherosclerotic disease of the aorta. Possible hiatal hernia. Electronically Signed   By: Ted Mcalpineobrinka  Dimitrova M.D.   On: 01/11/2018 16:56   Telemetry    01/12/18 ST HR 110's  - Personally Reviewed  ECG    No new tracing as of 01/12/2018- Personally Reviewed  Cardiac Studies   2D echocardiogram 01/10/2018: Study Conclusions  - Left ventricle: The cavity size was normal. Wall thickness was increased in a pattern of mild LVH. Basal to mid inferior akinesis. Basal inferoseptal akinesis. The estimated ejection fraction was 55%. Doppler parameters are consistent with abnormal left ventricular relaxation (grade 1 diastolic dysfunction). - Aortic valve: Trileaflet; moderately calcified leaflets. There was mild stenosis. Mean gradient (S): 13 mm Hg. Valve area (Vmean): 1.74 cm^2. - Mitral valve: There was trivial regurgitation. - Left atrium: The atrium was mildly dilated. - Right ventricle: The cavity size was normal. Systolic function was normal. - Pulmonary arteries: No complete TR doppler jet so unable to estimate PA systolic pressure. - Inferior vena cava: The vessel was normal in size.  The respirophasic diameter changes were in the normal range (>= 50%), consistent with normal central venous pressure.  Impressions:  - Normal LV size with mild LV hypertrophy. EF 55% with basal to mid inferior akinesis and basal inferoseptal akinesis. Normal RV size and systolic function. Mild aortic stenosis.   Cardiac catheterization 01/10/2018:   Mid RCA lesion is 99% stenosed.  A drug-eluting stent was successfully placed.  Post intervention, there is a 0% residual stenosis.   Volney AmericanJean C [name] is a 85 y.o. female  Patient Profile     85 y.o. female with a history of hypertension and COPD who presented with an acute inferior STEMI complicated by ventricular fibrillation cardiac arrest, treated with primary PCI.  Assessment & Plan    1. Inferior NSTEMI: -Progressing well after PCI with DES to RCA -LVEF found to be 55% per echocardiogram  -Denies chest pain>>>contiues to have muscular pain s/p CPR -CXR negative for fracture  -Continue ASA, Plavix, BB, ACE, high intensity statin  2. VF arrest: -No arrhythmias per telemetry review -Continues with significant chest soreness post CPR -CXR with no acute abnormality following CPR -Continue analgesics as needed   3. Essential HTN: -Stable, 160/72, 143/76, 142/91, 142/71 -Continue metoprolol and lisinopril  -Will increase lisinopril to 10 given persistently elevated BP  4. HLD: -Stable continue statin   5. Right carotid bruit: -Carotid US ordered>>to be performed    Signed, Georgie Chard NP-C [facility] Pager: [phone] 01/12/2018, 8:24 AM     For questions or updates, please contact   Please consult [email or web address] for contact info under Cardiology/STEMI.  Patient seen, examined. Available data reviewed. Agree with findings, assessment, and plan as outlined by Georgie Chard, NP-C.  The patient is independently interviewed and examined.  On my exam, she is alert, oriented, in no distress.  Lung fields are  clear, heart is regular rate and rhythm with a grade 2/6 systolic murmur at the left sternal border.  Abdomen is soft and nontender, extremities show no edema.  The right groin site has a large ecchymotic area but it is soft without firm hematoma.  The patient continues to have chest soreness related to CPR.  She otherwise is doing just fine and appears stable for hospital discharge.  I am going to ask for a physical therapy consult prior to discharge.  Will arrange outpatient follow-up.  She will be continued on her current medications which are reviewed today and outlined above.  Carotid ultrasound is still pending.  Tonny Bollman, M.D. 01/12/2018 10:47 AM

## 2018-01-14 ENCOUNTER — Emergency Department (HOSPITAL_COMMUNITY)
Admission: EM | Admit: 2018-01-14 | Discharge: 2018-01-14 | Disposition: A | Discharge status: Home / Self Care | Payer: Medicare Other | Attending: Emergency Medicine | Admitting: Emergency Medicine

## 2018-01-14 ENCOUNTER — Telehealth: Payer: Self-pay | Admitting: Physician Assistant

## 2018-01-14 ENCOUNTER — Encounter (HOSPITAL_COMMUNITY): Payer: Self-pay | Admitting: Emergency Medicine

## 2018-01-14 ENCOUNTER — Other Ambulatory Visit: Payer: Self-pay

## 2018-01-14 DIAGNOSIS — H9191 Unspecified hearing loss, right ear: Secondary | ICD-10-CM

## 2018-01-14 DIAGNOSIS — Z79899 Other long term (current) drug therapy: Secondary | ICD-10-CM | POA: Diagnosis not present

## 2018-01-14 DIAGNOSIS — I213 ST elevation (STEMI) myocardial infarction of unspecified site: Secondary | ICD-10-CM | POA: Diagnosis not present

## 2018-01-14 DIAGNOSIS — Z7982 Long term (current) use of aspirin: Secondary | ICD-10-CM | POA: Insufficient documentation

## 2018-01-14 DIAGNOSIS — J449 Chronic obstructive pulmonary disease, unspecified: Secondary | ICD-10-CM | POA: Insufficient documentation

## 2018-01-14 DIAGNOSIS — I10 Essential (primary) hypertension: Secondary | ICD-10-CM | POA: Insufficient documentation

## 2018-01-14 DIAGNOSIS — R000 Tachycardia, unspecified: Secondary | ICD-10-CM | POA: Diagnosis not present

## 2018-01-14 MED ORDER — PREDNISONE 20 MG PO TABS: 60.0000 mg | ORAL_TABLET | Freq: Every day | ORAL | 0 refills | Status: AC

## 2018-01-14 NOTE — ED Triage Notes (Signed)
Per EMS pt coming from home after a sudden onset of right hearing loss. Patient contacted cardiologist and sent her for further eval and possible medication reaction.

## 2018-01-14 NOTE — Telephone Encounter (Signed)
Patient daughter is calling because she had sudden hearing loss in her left ear.  A few years ago, she had a condition where she suddenly lost hearing in her right ear.  They later saw an ENT who advised that if she had been treated with high-dose steroids within 24 hours, she would not have lost her hearing.  The daughter is afraid the same thing is happening again on the other side.  Her mother and she are very upset about this.  I advised that I was not aware of hearing loss being a side effect of any of the medications she was put on because of her MI.  I suggested that she take her mother to the closest emergency room, which would be [facility].  The daughter states that she would rather go to [facility], but I encouraged her to go to the closest facility instead.  Theodore Demark, PA-C 01/14/2018 12:54 PM Beeper [phone]

## 2018-01-14 NOTE — ED Provider Notes (Signed)
[facility] EMERGENCY DEPARTMENT Provider Note   CSN: [id number] Arrival date & time: 01/14/18  1615     History   Chief Complaint Chief Complaint  Patient presents with  . Hearing Loss    HPI Beverly [name] is a 84 y.o. female.  85 year old female with past medical history below including recent STEMI c/b cardiac arrest and CPR, COPD, traumatic SDH who p/w R hearing loss.  Several years ago the patient experienced sudden left ear hearing loss.  She was eventually seen by ENT who said that if she had been given steroids she might have been able to recover but she has had chronic left ear hearing loss since that time.  Last night she went to bed in her usual state of health.  Overnight she noted some hearing problems in her right ear and this morning she has continued to have hearing loss in the right side.  She denies any associated tinnitus, vertigo, otalgia, vomiting, fevers, speech problems, or extremity weakness.  She was just discharged from the hospital 2 days ago and was started on several new medicines including Lipitor, lisinopril, aspirin, Brilinta.   The history is provided by the patient and a relative.    Past Medical History:  Diagnosis Date  . Anxiety   . Arthritis   . COPD (chronic obstructive pulmonary disease) (HCC)   . Coronary artery disease   . GERD (gastroesophageal reflux disease)   . Headache(784.0)   . Hearing loss, central    Left ear  . Hematoma   . Hypertension   . Memory changes   . Seizures (HCC) 10/2012   no seizures, speech issues; after a fall, hitting head on left side    Patient Active Problem List   Diagnosis Date Noted  . Hypokalemia 01/12/2018  . Hypomagnesemia 01/12/2018  . COPD (chronic obstructive pulmonary disease) (HCC) 01/12/2018  . Acute ST elevation myocardial infarction (STEMI) of inferior wall (HCC) 01/10/2018  . Cardiac arrest (HCC)   . Dyslipidemia (high LDL; low HDL)   . 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 History of traumatic subdural hematoma 06/23/2014  . Subdural hematoma (HCC) 10/18/2012  . TIA (transient ischemic attack) 10/18/2012  . HTN (hypertension) 10/18/2012  . Hyponatremia 10/18/2012    Past Surgical History:  Procedure Laterality Date  . ABDOMINAL HYSTERECTOMY    . APPENDECTOMY    . CARDIAC CATHETERIZATION     15 yrs ago  . CORONARY/GRAFT ACUTE MI REVASCULARIZATION N/A 01/10/2018   Procedure: Coronary/Graft Acute MI Revascularization;  Surgeon: Runell Gess, MD;  Location: [facility] INVASIVE CV LAB;  Service: Cardiovascular;  Laterality: N/A;  . CRANIOTOMY Left 10/30/2012   Procedure: CRANIOTOMY HEMATOMA EVACUATION SUBDURAL;  Surgeon: Maeola Harman, MD;  Location: [facility] NEURO ORS;  Service: Neurosurgery;  Laterality: Left;  Left Craniotomy for evacuation of subdural hematoma  . JOINT REPLACEMENT Bilateral    knees  . LEFT HEART CATH AND CORONARY ANGIOGRAPHY N/A 01/10/2018   Procedure: LEFT HEART CATH AND CORONARY ANGIOGRAPHY;  Surgeon: Runell Gess, MD;  Location: [facility] INVASIVE CV LAB;  Service: Cardiovascular;  Laterality: N/A;     OB History   No obstetric history on file.      Home Medications    Prior to Admission medications   Medication Sig Start Date End Date Taking? Authorizing Provider  ALPRAZolam Prudy Feeler) 0.5 MG tablet Take 0.5 mg by mouth 3 (three) times daily  as needed for anxiety.    [provider]  aspirin 81 MG chewable tablet Chew 1 tablet (81 mg total) by mouth daily. 01/13/18   Filbert Schilder, NP  atorvastatin (LIPITOR) 80 MG tablet Take 1 tablet (80 mg total) by mouth daily at 6 PM. 01/12/18   Georgie Chard D, NP  celecoxib (CELEBREX) 200 MG capsule Take 200 mg by mouth daily.    [provider]  cholecalciferol (VITAMIN D) 1000 UNITS tablet Take 1,000 Units by mouth daily.     [provider]  fluticasone (FLONASE) 50 MCG/ACT nasal spray Place 1 spray into both nostrils daily.  09/18/12   [provider]  HYDROcodone-acetaminophen (NORCO) 7.5-325 MG per tablet Take 1 tablet by mouth every 6 (six) hours as needed for pain.     [provider]  levETIRAcetam (KEPPRA) 500 MG tablet TAKE ONE TABLET TWICE DAILY Patient taking differently: Take 500 mg by mouth 2 (two) times daily.  12/08/15   Drema Dallas, DO  lisinopril (PRINIVIL,ZESTRIL) 10 MG tablet Take 1 tablet (10 mg total) by mouth daily. 01/13/18   Filbert Schilder, NP  methocarbamol (ROBAXIN) 750 MG tablet Take 750 mg by mouth 2 (two) times daily.     [provider]  metoprolol succinate (TOPROL-XL) 25 MG 24 hr tablet Take 1 tablet (25 mg total) by mouth daily. 01/13/18   Filbert Schilder, NP  Multiple Vitamins-Minerals (PRESERVISION/LUTEIN PO) Take 1 tablet by mouth 2 (two) times daily.    [provider]  nitroGLYCERIN (NITROSTAT) 0.4 MG SL tablet Place 1 tablet (0.4 mg total) under the tongue every 5 (five) minutes x 3 doses as needed for chest pain. 01/12/18   Georgie Chard D, NP  pantoprazole (PROTONIX) 40 MG tablet Take 40 mg by mouth daily.    [provider]  potassium chloride (K-DUR) 10 MEQ tablet Take 10 mEq by mouth daily.     [provider]  predniSONE (DELTASONE) 20 MG tablet Take 3 tablets (60 mg total) by mouth daily for 7 days. 01/14/18 01/21/18  Aliyah Abeyta, Ambrose Finland, MD  Propylene Glycol (SYSTANE BALANCE OP) Place 1-2 drops into both eyes daily as needed (dry eyes).    [provider]  ticagrelor (BRILINTA) 90 MG TABS tablet Take 1 tablet (90 mg total) by mouth 2 (two) times daily. 01/12/18   Filbert Schilder, NP    Family History Family History  Problem Relation Age of Onset  . Ataxia Neg Hx   . Chorea Neg Hx   . Dementia Neg Hx   . Mental retardation Neg Hx   . Migraines Neg Hx   . Multiple sclerosis Neg Hx   . Neurofibromatosis Neg Hx   .  Neuropathy Neg Hx   . Parkinsonism Neg Hx   . Seizures Neg Hx   . Stroke Neg Hx     Social History Social History   Tobacco Use  . Smoking status: Never Smoker  . Smokeless tobacco: Never Used  Substance Use Topics  . Alcohol use: No  . Drug use: No     Allergies   Penicillins   Review of Systems Review of Systems All other systems reviewed and are negative except that which was mentioned in HPI   Physical Exam Updated Vital Signs BP (!) 119/52   Pulse 91   Temp 99.1 F (37.3 C) (Oral)   Resp 18   SpO2 99%   Physical Exam Vitals signs and nursing note reviewed.  Constitutional:      General: She is not in acute distress.    Appearance: She is well-developed.     Comments: Awake, alert  HENT:     Head: Normocephalic and atraumatic.     Right Ear: Tympanic membrane and ear canal normal.     Ears:     Comments: L ear cerumen obscuring TM Eyes:     Extraocular Movements: Extraocular movements intact.     Conjunctiva/sclera: Conjunctivae normal.     Pupils: Pupils are equal, round, and reactive to light.  Neck:     Musculoskeletal: Neck supple.  Cardiovascular:     Rate and Rhythm: Normal rate and regular rhythm.     Heart sounds: Normal heart sounds. No murmur.  Pulmonary:     Effort: Pulmonary effort is normal. No respiratory distress.     Breath sounds: Normal breath sounds.  Abdominal:     General: Bowel sounds are normal. There is no distension.     Palpations: Abdomen is soft.     Tenderness: There is no abdominal tenderness.  Skin:    General: Skin is warm and dry.  Neurological:     Mental Status: She is alert and oriented to person, place, and time.     Cranial Nerves: No cranial nerve deficit.     Motor: No abnormal muscle tone.     Deep Tendon Reflexes: Reflexes are normal and symmetric.     Comments: Fluent speech, normal finger-to-nose testing, negative pronator drift, no clonus 5/5 strength and normal sensation x all 4 extremities    Psychiatric:     Comments: anxious      ED Treatments / Results  Labs (all labs ordered are listed, but only abnormal results are displayed) Labs Reviewed - No data to display  EKG EKG Interpretation  Date/Time:  Sunday January 14 2018 16:24:17 EST Ventricular Rate:  98 PR Interval:    QRS Duration: 79 QT Interval:  335 QTC Calculation: 428 R Axis:   22 Text Interpretation:  Sinus tachycardia Ventricular premature complex since previous tracing, T wave inversion in III have improved Confirmed by Frederick Peers [phone]) on 01/14/2018 4:34:06 PM   Radiology No results found.  Procedures Procedures (including critical care time)  Medications Ordered in ED Medications - No data to display   Initial Impression / Assessment and Plan / ED Course  I have reviewed the triage vital signs and the nursing notes.     She was anxious but neurologically intact on exam aside for some difficulty hearing.  She was able to understand my speech with louder volume.  Normal appearance of TM on the right.  Contacted ENT, discussed with Dr. Doran Heater.  I appreciate her assistance with the patient's care.  She has explained that sudden hearing loss and isolation from any other symptoms could represent a viral process and early steroid course can improve outcomes in some patients.  Therefore, recommended 60 mg prednisone daily for 1 week.  I discussed this plan with the patient and her family.  She had has some hesitations because of her history of macular degeneration and concerned that steroids may increase IOP.  I recommended that she start steroid tonight and contact her eye doctor tomorrow morning for assessment and further discussion this week.  I feel like the potential benefits of the steroid outweigh possible risk at this point in time.  I have extensively reviewed return precautions with patient and family and they voiced understanding.  She will contact ENT  clinic for follow-up this  week.  Final Clinical Impressions(s) / ED Diagnoses   Final diagnoses:  Hearing loss of right ear, unspecified hearing loss type    ED Discharge Orders         Ordered    predniSONE (DELTASONE) 20 MG tablet  Daily     01/14/18 1748           Dagmar Adcox, Ambrose Finlandachel Morgan, MD 01/14/18 Rickey [id number]

## 2018-01-15 NOTE — Telephone Encounter (Signed)
Patient's daughter Beverly [name] contacted (the pt gave permission over the phone for me to talk with her daughter concerning her hospital discharge)regarding discharge from [facility] on 01/12/2018.  Patient understands to follow up with provider Ronie Spies, PA-c on 01/19/2018 at 12:00 at [address] [facility] [address] in [place]. Patient understands discharge instructions? Yes Patient understands medications and regiment? Yes Patient understands to bring all medications to this visit? Yes

## 2018-01-16 DIAGNOSIS — I2119 ST elevation (STEMI) myocardial infarction involving other coronary artery of inferior wall: Secondary | ICD-10-CM | POA: Diagnosis not present

## 2018-01-16 DIAGNOSIS — Z7982 Long term (current) use of aspirin: Secondary | ICD-10-CM | POA: Diagnosis not present

## 2018-01-16 DIAGNOSIS — E782 Mixed hyperlipidemia: Secondary | ICD-10-CM | POA: Diagnosis not present

## 2018-01-16 DIAGNOSIS — M1991 Primary osteoarthritis, unspecified site: Secondary | ICD-10-CM | POA: Diagnosis not present

## 2018-01-16 DIAGNOSIS — J449 Chronic obstructive pulmonary disease, unspecified: Secondary | ICD-10-CM | POA: Diagnosis not present

## 2018-01-16 DIAGNOSIS — Z96653 Presence of artificial knee joint, bilateral: Secondary | ICD-10-CM | POA: Diagnosis not present

## 2018-01-16 DIAGNOSIS — I10 Essential (primary) hypertension: Secondary | ICD-10-CM | POA: Diagnosis not present

## 2018-01-16 DIAGNOSIS — Z7952 Long term (current) use of systemic steroids: Secondary | ICD-10-CM | POA: Diagnosis not present

## 2018-01-16 DIAGNOSIS — Z791 Long term (current) use of non-steroidal anti-inflammatories (NSAID): Secondary | ICD-10-CM | POA: Diagnosis not present

## 2018-01-17 ENCOUNTER — Encounter: Payer: Self-pay | Admitting: Physician Assistant

## 2018-01-17 NOTE — Progress Notes (Addendum)
Cardiology Office Note    Date:  01/19/2018  ID:  Beverly [name], DOB February 23, 1932, MRN [id number] PCP:  Kari Baars, MD  Cardiologist:  Tonny Bollman, MD   Chief Complaint: f/u STEMI  History of Present Illness:  Beverly [name] is a 85 y.o. female with history of HTN, COPD, HLD, recent CAD/STEMI with cardiac arrest (polymorphic VT) who presents for post-hospital follow-up. She presented with severe chest pain with radiation into neck and jaw. In the EMS truck she was found to have inferior ST elevations which were transmitted to the ER and a code STEMI was called initially.EKG changes stated started to reduce and interventional took a look at the imaging ECGs and code STEMI was canceled. She receive a dose of nitroglycerin in the EMS truck and chest pain resolved however she became extremely hypotensive with BP'sin the 80's systolic from hypertensive blood pressure. In the ER at time of cardiology evaluation she was asymptomatic and ST segments had resolved. However, she develope recurrent CP and ST elevation prompting facilitation of cath. While awaiting Cath Lab team patient had episode of likely ventricular tachycardia polymorphic in which she lost her pulse and was unresponsive and required CPR, defibrillation and amiodarone. After about a 5-minute code this resolved and patient was back to her baseline was alert oriented able to protect her airway. On 01/10/2018 she was taken emergently to the cath lab in which a successful PCI/DES was performed to the [facility] with a 99% stenosed lesion. No other significant disease was noted. Echocardiogram performed 01/10/2018 showed EF 55% with basal to mid inferior akinesis and basal inferoseptal akinesis, mild aortic stenosis, normal RV. She was started on post-MI therapy. She did have chest soreness s/p CPR. Carotid US was ordered for R carotid bruit as OP but not yet scheduled. Last labs showed Na 134, K 3.4, Cr 0.77, glucose 149, Hgb 8.7, albumin 3.3, AST/ALT  OK, LDL 61. She was noted to have significant extensive post cath ecchymosis but no hematoma.  She returns for follow-up with her daughter and her husband. She feels generally weak, but seems to be improving. Her chest remains sore from CPR but no anginal CP. No SOB, orthopnea, LEE. She feels like her bruising is subsiding albeit very, very slowly. She denies any syncope or palpitations. She went to the ER for sudden right hearing loss felt related to viral nerve infection and is being treated with a course of prednisone. Last dose is on Sunday.  Past Medical History:  Diagnosis Date  . Anxiety   . Arthritis   . COPD (chronic obstructive pulmonary disease) (HCC)   . Coronary artery disease    a. STEMI with cardiac arrest (polymorphic VT) s/p DES to RCA.  . GERD (gastroesophageal reflux disease)   . Headache(784.0)   . Hearing loss, central    Left ear  . Hematoma   . Hypertension   . Hypokalemia   . Memory changes   . Mild aortic stenosis   . Right carotid bruit   . Seizures (HCC) 10/2012   no seizures, speech issues; after a fall, hitting head on left side  . VT (ventricular tachycardia) (HCC)     Past Surgical History:  Procedure Laterality Date  . ABDOMINAL HYSTERECTOMY    . APPENDECTOMY    . CARDIAC CATHETERIZATION     15  yrs ago  . CORONARY/GRAFT ACUTE MI REVASCULARIZATION N/A 01/10/2018   Procedure: Coronary/Graft Acute MI Revascularization;  Surgeon: Runell Gess, MD;  Location: [facility] INVASIVE  CV LAB;  Service: Cardiovascular;  Laterality: N/A;  . CRANIOTOMY Left 10/30/2012   Procedure: CRANIOTOMY HEMATOMA EVACUATION SUBDURAL;  Surgeon: Maeola HarmanJoseph Stern, MD;  Location: [facility] NEURO ORS;  Service: Neurosurgery;  Laterality: Left;  Left Craniotomy for evacuation of subdural hematoma  . JOINT REPLACEMENT Bilateral    knees  . LEFT HEART CATH AND CORONARY ANGIOGRAPHY N/A 01/10/2018   Procedure: LEFT HEART CATH AND CORONARY ANGIOGRAPHY;  Surgeon: Runell GessBerry, Jonathan J, MD;  Location: [facility]  INVASIVE CV LAB;  Service: Cardiovascular;  Laterality: N/A;    Current Medications: Current Meds  Medication Sig  . ALPRAZolam (XANAX) 0.5 MG tablet Take 0.5 mg by mouth 3 (three) times daily as needed for anxiety.  Marland Kitchen. aspirin 81 MG chewable tablet Chew 1 tablet (81 mg total) by mouth daily.  Marland Kitchen. atorvastatin (LIPITOR) 80 MG tablet Take 1 tablet (80 mg total) by mouth daily at 6 PM.  . celecoxib (CELEBREX) 200 MG capsule Take 200 mg by mouth daily.  . cholecalciferol (VITAMIN D) 1000 UNITS tablet Take 1,000 Units by mouth daily.   . fluticasone (FLONASE) 50 MCG/ACT nasal spray Place 1 spray into both nostrils daily.   Marland Kitchen. HYDROcodone-acetaminophen (NORCO) 10-325 MG tablet Take 1 tablet by mouth every 6 (six) hours as needed.  . levETIRAcetam (KEPPRA) 500 MG tablet TAKE ONE TABLET TWICE DAILY (Patient taking differently: Take 500 mg by mouth 2 (two) times daily. )  . lisinopril (PRINIVIL,ZESTRIL) 10 MG tablet Take 1 tablet (10 mg total) by mouth daily.  . methocarbamol (ROBAXIN) 750 MG tablet Take 500 mg by mouth 2 (two) times daily.   . metoprolol succinate (TOPROL-XL) 25 MG 24 hr tablet Take 1 tablet (25 mg total) by mouth daily.  . Multiple Vitamins-Minerals (PRESERVISION/LUTEIN PO) Take 1 tablet by mouth 2 (two) times daily.  . nitroGLYCERIN (NITROSTAT) 0.4 MG SL tablet Place 1 tablet (0.4 mg total) under the tongue every 5 (five) minutes x 3 doses as needed for chest pain.  . pantoprazole (PROTONIX) 40 MG tablet Take 40 mg by mouth daily.  . potassium chloride (K-DUR) 10 MEQ tablet Take 10 mEq by mouth daily.   . predniSONE (DELTASONE) 20 MG tablet Take 3 tablets (60 mg total) by mouth daily for 7 days.  Marland Kitchen. Propylene Glycol (SYSTANE BALANCE OP) Place 1-2 drops into both eyes daily as needed (dry eyes).  . ticagrelor (BRILINTA) 90 MG TABS tablet Take 1 tablet (90 mg total) by mouth 2 (two) times daily.      Allergies:   Penicillins   Social History   Socioeconomic History  . Marital  status: Married    Spouse name: Not on file  . Number of children: Not on file  . Years of education: Not on file  . Highest education level: Not on file  Occupational History  . Not on file  Social Needs  . Financial resource strain: Not on file  . Food insecurity:    Worry: Not on file    Inability: Not on file  . Transportation needs:    Medical: Not on file    Non-medical: Not on file  Tobacco Use  . Smoking status: Never Smoker  . Smokeless tobacco: Never Used  Substance and Sexual Activity  . Alcohol use: No  . Drug use: No  . Sexual activity: Not on file  Lifestyle  . Physical activity:    Days per week: Not on file    Minutes per session: Not on file  . Stress: Not on  file  Relationships  . Social connections:    Talks on phone: Not on file    Gets together: Not on file    Attends religious service: Not on file    Active member of club or organization: Not on file    Attends meetings of clubs or organizations: Not on file    Relationship status: Not on file  Other Topics Concern  . Not on file  Social History Narrative  . Not on file     Family History:  The patient's family history is negative for Ataxia, Chorea, Dementia, Mental retardation, Migraines, Multiple sclerosis, Neurofibromatosis, Neuropathy, Parkinsonism, Seizures, and Stroke.  ROS:   Please see the history of present illness.  All other systems are reviewed and otherwise negative.    PHYSICAL EXAM:   VS:  BP (!) 160/80   Pulse 76   Ht 5\' 1"  (1.549 m)   Wt 116 lb 1.9 oz (52.7 kg)   BMI 21.94 kg/m   BMI: Body mass index is 21.94 kg/m. GEN: Well nourished, well developed pale WF in no acute distress HEENT: normocephalic, atraumatic Neck: no JVD or masses. + bilateral carotid bruits Cardiac: RRR; 2/6 SEM at RUSB. No rubs or gallops, no edema  Respiratory:  clear to auscultation bilaterally, normal work of breathing GI: soft, nontender, nondistended, + BS MS: no deformity or  atrophy Skin: warm and dry, no rash, diffuse right groin ecchymosis extending down right leg and right flank (pt reports similar to when in hospital). She still hand pressure dressing in place on R groin. No bleeding. No femoral bruit. Neuro:  Alert and Oriented x 3, Strength and sensation are intact, follows commands Psych: euthymic mood, full affect  Wt Readings from Last 3 Encounters:  01/19/18 116 lb 1.9 oz (52.7 kg)  01/12/18 113 lb 2 oz (51.3 kg)  09/09/15 137 lb (62.1 kg)      Studies/Labs Reviewed:   EKG:  EKG was ordered today and personally reviewed by me and demonstrates NSR 76bpm with sinus arrhythmia, nonspecific TW changes in III and 1 PVC, Qtc , nonacute.  Recent Labs: 01/10/2018: ALT <5 01/12/2018: BUN 11; Creatinine, Ser 0.77; Hemoglobin 8.7; Magnesium 1.5; Platelets 159; Potassium 3.4; Sodium 134   Lipid Panel    Component Value Date/Time   CHOL 151 01/10/2018 0428   TRIG 39 01/10/2018 0428   HDL 82 01/10/2018 0428   CHOLHDL 1.8 01/10/2018 0428   VLDL 8 01/10/2018 0428   LDLCALC 61 01/10/2018 0428    Additional studies/ records that were reviewed today include: Summarized above.  ASSESSMENT & PLAN:   1. CAD with recent STEMI - she reports generalized fatigue which I suspect is probably due to her anemia. Check CBC today along with repeat lytes. Continue ASA, BB, Brilinta, atorvastatin. She will need to see Dr. [name] as well for general medical follow-up and in setting of rib issues. Her daughter noticed a mild bowing of her right posterior rib on her back but it's not clear that this has any relationship to recent hospitalization. It is not acutely tender or painful. There was on abnormality on recent CXR. I've asked her to give Dr. [name] a call to discuss. Given statin initiation, check CMET/lipids in 6 weeks. Will also trend CBC at that time. 2. Ventricular tachycardia - occurred in context of acute MI. Given cardiac arrest, advised no driving for 6  months. She does not typically drive a whole lot and frequently relies on family, so she is OK  with this recommendation. 3. Essential HTN - BP running higher today. It was running 140s-160s in the hospital at end of hospitalization prompting titration of lisinopril. Will increase to 20mg  daily. May also be affected by prednisone which she will finish on Sunday. I offered her a nurse BP check in a week but they prefer to do home checks instead. I asked her to follow her BP once daily and send in readings on Tuesday-Wednesday for review. If needed we can titrate lisinopril further. 4. Right carotid bruit - R carotid US was ordered in the hospital but I actually appreciate bilateral bruits today so will update order for bilateral US. May represent radiation from carotids. 5. Hypokalemia/hypomagnesemia - update lytes today. 6. Anemia - suspect related to post-cath ecchymosis. This is pretty impressive today but she reports it's improving. Check CBC, anemia panel. If Hgb has continued to decline we may need to think about a groin US but for now will monitor clinically. 7. Mild aortic stenosis - follow clinically, anticipate f/u echocardiograms in the years ahead.  Disposition: F/u with 6 weeks with me.  Medication Adjustments/Labs and Tests Ordered: Current medicines are reviewed at length with the patient today.  Concerns regarding medicines are outlined above. Medication changes, Labs and Tests ordered today are summarized above and listed in the Patient Instructions accessible in Encounters.   Signed, Laurann Montanaayna N , PA-C  01/19/2018 11:57 AM    [facility] [address], [place], [place]  [address] Phone: [phone]; Fax: [phone]

## 2018-01-19 ENCOUNTER — Ambulatory Visit: Payer: Medicare Other | Admitting: Physician Assistant

## 2018-01-19 ENCOUNTER — Encounter: Payer: Self-pay | Admitting: Physician Assistant

## 2018-01-19 VITALS — BP 160/80 | HR 76 | Ht 61.0 in | Wt 116.1 lb

## 2018-01-19 DIAGNOSIS — I2510 Atherosclerotic heart disease of native coronary artery without angina pectoris: Secondary | ICD-10-CM

## 2018-01-19 DIAGNOSIS — D649 Anemia, unspecified: Secondary | ICD-10-CM

## 2018-01-19 DIAGNOSIS — I472 Ventricular tachycardia, unspecified: Secondary | ICD-10-CM

## 2018-01-19 DIAGNOSIS — R0989 Other specified symptoms and signs involving the circulatory and respiratory systems: Secondary | ICD-10-CM | POA: Diagnosis not present

## 2018-01-19 DIAGNOSIS — I10 Essential (primary) hypertension: Secondary | ICD-10-CM | POA: Diagnosis not present

## 2018-01-19 DIAGNOSIS — E876 Hypokalemia: Secondary | ICD-10-CM

## 2018-01-19 LAB — BASIC METABOLIC PANEL
BUN/Creatinine Ratio: 26 (ref 12–28)
BUN: 17 mg/dL (ref 8–27)
CO2: 27 mmol/L (ref 20–29)
CREATININE: 0.66 mg/dL (ref 0.57–1.00)
Calcium: 9.6 mg/dL (ref 8.7–10.3)
Chloride: 97 mmol/L (ref 96–106)
GFR calc Af Amer: 94 mL/min/{1.73_m2} (ref >59)
GFR calc non Af Amer: 82 mL/min/{1.73_m2} (ref >59)
Glucose: 137 mg/dL — ABNORMAL HIGH (ref 65–99)
Potassium: 4.7 mmol/L (ref 3.5–5.2)
Sodium: 134 mmol/L (ref 134–144)

## 2018-01-19 LAB — FOLATE: Folate: 17.1 ng/mL (ref >3.0)

## 2018-01-19 LAB — CBC
HEMATOCRIT: 28.4 % — AB (ref 34.0–46.6)
Hemoglobin: 9.7 g/dL — ABNORMAL LOW (ref 11.1–15.9)
MCH: 29.5 pg (ref 26.6–33.0)
MCHC: 34.2 g/dL (ref 31.5–35.7)
MCV: 86 fL (ref 79–97)
Platelets: 665 10*3/uL — ABNORMAL HIGH (ref 150–450)
RBC: 3.29 x10E6/uL — ABNORMAL LOW (ref 3.77–5.28)
RDW: 14.1 % (ref 11.7–15.4)
WBC: 15 10*3/uL — ABNORMAL HIGH (ref 3.4–10.8)

## 2018-01-19 LAB — MAGNESIUM: Magnesium: 1.7 mg/dL (ref 1.6–2.3)

## 2018-01-19 LAB — FERRITIN: Ferritin: 458 ng/mL — ABNORMAL HIGH (ref 15–150)

## 2018-01-19 LAB — VITAMIN B12: Vitamin B-12: >2000 pg/mL — ABNORMAL HIGH (ref 232–1245)

## 2018-01-19 LAB — IRON AND TIBC
IRON SATURATION: 22 % (ref 15–55)
Iron: 57 ug/dL (ref 27–139)
Total Iron Binding Capacity: 265 ug/dL (ref 250–450)
UIBC: 208 ug/dL (ref 118–369)

## 2018-01-19 MED ORDER — LISINOPRIL 20 MG PO TABS: 20.0000 mg | ORAL_TABLET | Freq: Every day | ORAL | 3 refills | Status: DC

## 2018-01-19 NOTE — Patient Instructions (Signed)
Medication Instructions:  Your physician has recommended you make the following change in your medication:  1.  INCREASE the Lisinopril to 20 mg daily   If you need a refill on your cardiac medications before your next appointment, please call your pharmacy.   Lab work: TODAY:  BMET, MAG, ANEMIA PANEL, & STAT CBC 6 WEEKS:  CMET, CB, & FASTING LIPIDS  If you have labs (blood work) drawn today and your tests are completely normal, you will receive your results only by: Marland Kitchen MyChart Message (if you have MyChart) OR . A paper copy in the mail If you have any lab test that is abnormal or we need to change your treatment, we will call you to review the results.  Testing/Procedures: Your physician has requested that you have a carotid duplex. This test is an ultrasound of the carotid arteries in your neck. It looks at blood flow through these arteries that supply the brain with blood. Allow one hour for this exam. There are no restrictions or special instructions.  Follow-Up: Your physician recommends that you schedule a follow-up appointment in: 6 WEEKS WITH DAYNA DUNN, PA-C   Any Other Special Instructions Will Be Listed Below (If Applicable).  Monitor your blood pressure once daily (2 hours after you take your medications) and send in the readings on Tuesday or Wednesday

## 2018-01-19 NOTE — Addendum Note (Signed)
Addended by: Laurann Montana on: 01/19/2018 01:34 PM   Modules accepted: Level of Service

## 2018-01-22 ENCOUNTER — Other Ambulatory Visit: Payer: Self-pay | Admitting: *Deleted

## 2018-01-22 DIAGNOSIS — Z791 Long term (current) use of non-steroidal anti-inflammatories (NSAID): Secondary | ICD-10-CM | POA: Diagnosis not present

## 2018-01-22 DIAGNOSIS — J449 Chronic obstructive pulmonary disease, unspecified: Secondary | ICD-10-CM | POA: Diagnosis not present

## 2018-01-22 DIAGNOSIS — Z7982 Long term (current) use of aspirin: Secondary | ICD-10-CM | POA: Diagnosis not present

## 2018-01-22 DIAGNOSIS — Z96653 Presence of artificial knee joint, bilateral: Secondary | ICD-10-CM | POA: Diagnosis not present

## 2018-01-22 DIAGNOSIS — E782 Mixed hyperlipidemia: Secondary | ICD-10-CM | POA: Diagnosis not present

## 2018-01-22 DIAGNOSIS — I2119 ST elevation (STEMI) myocardial infarction involving other coronary artery of inferior wall: Secondary | ICD-10-CM | POA: Diagnosis not present

## 2018-01-22 DIAGNOSIS — Z7952 Long term (current) use of systemic steroids: Secondary | ICD-10-CM | POA: Diagnosis not present

## 2018-01-22 DIAGNOSIS — M1991 Primary osteoarthritis, unspecified site: Secondary | ICD-10-CM | POA: Diagnosis not present

## 2018-01-22 DIAGNOSIS — I10 Essential (primary) hypertension: Secondary | ICD-10-CM | POA: Diagnosis not present

## 2018-01-22 NOTE — Patient Outreach (Addendum)
[facility]) Care Management  01/22/2018  Beverly [name] 09/12/1932 [id number]   Subjective: Telephone call to patient's home number, spoke with patient, and HIPAA verified.  Discussed [facility] Care Management [employer] Utilization Management follow up, patient voiced understanding, and is in agreement to follow up.   Patient gave verbal authorization for this RNCM to speak with daughter Beverly [name], regarding her healthcare needs as needed.  Patient states she is very hard of hearing and requested RNCM to speak with daughter.  Spoke with patient's daughter Beverly [name], discussed [facility] Care Management [employer] Utilization Management follow up, daughter voiced understanding, and is in agreement to follow up on patient's behalf.  Daughter states patient's Human resources officer broken part has been replaced, recliner has been fixed by daughter's husband, issue resolved, and no additional needs at this time.  Daughter states she is in the process of purchasing a tub bench or shower chair for the patient, patient has a bedside commode/ 3 in 1, is aware of the different types of tub bench / shower chair, and today is planning to purchase from local durable medical equipment vendor.  RNCM advised daughter to follow up with patient's insurance company regarding durable medical equipment benefit, daughter voiced understanding, and states she will follow up.  Daughter states patient  does not have any education material, transition of care, care coordination, disease management, disease monitoring, transportation, community resource, or pharmacy needs at this time.   States she is very appreciative of the follow up.  RNCM provided contact name and number: [phone],  [facility] main office number [phone] and 24 hour nurse advise line [phone].     Objective: Per KPN (Knowledge Performance Now, point of care tool) and chart review, patient hospitalized 01/10/2018 -01/12/2018 for Acute ST  elevation myocardial infarction (STEMI) of inferior wall, with cardiac arrest, CPR, status post Cardiac catheterization with drug-eluting stent on 01/10/2018.   Had ED visit on 01/14/2018 for hearing loss.   Patient also has a history of hypertension, seizures, COPD, memory changes, TIA (transient ischemic attack), traumatic subdural hematoma, and Dyslipidemia.    Assessment:  Received [employer] Management referral on 01/19/2018 from Beverly [name] at [facility]. Referral reason: patient sleeps in an Human resources officer, recliner broken, needs repair, and needs assistance with obtaining rental recliner while electric recliner being repaired.   Screening follow up completed and no further care management needs.     Plan: RNCM will complete case closure due to follow up completed / no care management needs.  RNCM will send MD case closure letter.       Beverly Osika H. Gardiner Barefoot, BSN, CCM [facility] Care Management [facility] Telephonic CM Phone: [phone] Fax: [phone]

## 2018-01-22 NOTE — Progress Notes (Signed)
Transitions of Care Follow Up Call Note  Beverly [name] is an 85 y.o. female who presented to [facility] on 01/10/2018.  The patient had the following prescriptions filled at [facility] Transitions of Care Pharmacy: Brilinta, nitrostat, lisinopril, atorvastatin.   Patient was called by pharmacist and HIPAA identifiers were verified. The following questions were asked about the prescriptions filled at [facility]:  Has the patient been experiencing any side effects to the medications prescribed? no Understanding of regimen: fair Understanding of indications: fair Potential of compliance: fair  Pharmacist comments: Patient relies on daughter to keep up with her medications. Reports no questions or complaints.    [x]  Patient's prescriptions filled at the [facility] Transitions of Care Pharmacy were transferred to the following pharmacy:  Pharmacy  []  Patient unable to be reached after calling three times and prescriptions filled at the [facility] Transitions of Care Pharmacy were transferred to preferred pharmacy found within their chart.   [name] 01/22/2018, 5:33 PM Transitions of Care Pharmacy Hours: Monday - Friday 8:30am to 5:00 PM  Phone - [phone]

## 2018-01-26 ENCOUNTER — Ambulatory Visit (HOSPITAL_COMMUNITY)
Admission: RE | Admit: 2018-01-26 | Discharge: 2018-01-26 | Disposition: A | Discharge status: Home / Self Care | Payer: Medicare Other | Source: Ambulatory Visit | Attending: Cardiovascular Disease | Admitting: Cardiovascular Disease

## 2018-01-26 DIAGNOSIS — R0989 Other specified symptoms and signs involving the circulatory and respiratory systems: Secondary | ICD-10-CM | POA: Diagnosis not present

## 2018-01-29 ENCOUNTER — Telehealth: Payer: Self-pay | Admitting: *Deleted

## 2018-01-29 DIAGNOSIS — Z7982 Long term (current) use of aspirin: Secondary | ICD-10-CM | POA: Diagnosis not present

## 2018-01-29 DIAGNOSIS — Z96653 Presence of artificial knee joint, bilateral: Secondary | ICD-10-CM | POA: Diagnosis not present

## 2018-01-29 DIAGNOSIS — I10 Essential (primary) hypertension: Secondary | ICD-10-CM | POA: Diagnosis not present

## 2018-01-29 DIAGNOSIS — Z7952 Long term (current) use of systemic steroids: Secondary | ICD-10-CM | POA: Diagnosis not present

## 2018-01-29 DIAGNOSIS — M1991 Primary osteoarthritis, unspecified site: Secondary | ICD-10-CM | POA: Diagnosis not present

## 2018-01-29 DIAGNOSIS — Z791 Long term (current) use of non-steroidal anti-inflammatories (NSAID): Secondary | ICD-10-CM | POA: Diagnosis not present

## 2018-01-29 DIAGNOSIS — J449 Chronic obstructive pulmonary disease, unspecified: Secondary | ICD-10-CM | POA: Diagnosis not present

## 2018-01-29 DIAGNOSIS — I2119 ST elevation (STEMI) myocardial infarction involving other coronary artery of inferior wall: Secondary | ICD-10-CM | POA: Diagnosis not present

## 2018-01-29 DIAGNOSIS — E782 Mixed hyperlipidemia: Secondary | ICD-10-CM | POA: Diagnosis not present

## 2018-01-29 NOTE — Telephone Encounter (Signed)
-----   Message from Laurann Montanaayna N Dunn, [place]-C sent at 01/26/2018  4:26 PM EST ----- Please let patient know carotid study looked good . Only mild 1-39% stenosis bilaterally, very mild. Her primary cardiologist can decide whether they want to repeat study in the next 1-2 years.

## 2018-01-29 NOTE — Telephone Encounter (Signed)
Called pt re: Vas Carotid US results, left a message for her to call back.

## 2018-01-31 DIAGNOSIS — M1991 Primary osteoarthritis, unspecified site: Secondary | ICD-10-CM | POA: Diagnosis not present

## 2018-01-31 DIAGNOSIS — I10 Essential (primary) hypertension: Secondary | ICD-10-CM | POA: Diagnosis not present

## 2018-01-31 DIAGNOSIS — Z7952 Long term (current) use of systemic steroids: Secondary | ICD-10-CM | POA: Diagnosis not present

## 2018-01-31 DIAGNOSIS — E782 Mixed hyperlipidemia: Secondary | ICD-10-CM | POA: Diagnosis not present

## 2018-01-31 DIAGNOSIS — Z7982 Long term (current) use of aspirin: Secondary | ICD-10-CM | POA: Diagnosis not present

## 2018-01-31 DIAGNOSIS — J449 Chronic obstructive pulmonary disease, unspecified: Secondary | ICD-10-CM | POA: Diagnosis not present

## 2018-01-31 DIAGNOSIS — Z791 Long term (current) use of non-steroidal anti-inflammatories (NSAID): Secondary | ICD-10-CM | POA: Diagnosis not present

## 2018-01-31 DIAGNOSIS — I2119 ST elevation (STEMI) myocardial infarction involving other coronary artery of inferior wall: Secondary | ICD-10-CM | POA: Diagnosis not present

## 2018-01-31 DIAGNOSIS — Z96653 Presence of artificial knee joint, bilateral: Secondary | ICD-10-CM | POA: Diagnosis not present

## 2018-02-02 NOTE — Telephone Encounter (Signed)
DPR ok to lmom. Left message carotids with very mild 1-39% stenosis b/l. Will leave up to primary cardiologist as when to repeat study in the next 1-2 yrs. Please call the office if any questions [phone].

## 2018-02-02 NOTE — Telephone Encounter (Signed)
-----   Message from Dayna N Dunn, PA-C sent at 01/26/2018  4:26 PM EST ----- Please let patient know carotid study looked good . Only mild 1-39% stenosis bilaterally, very mild. Her primary cardiologist can decide whether they want to repeat study in the next 1-2 years.  

## 2018-02-05 DIAGNOSIS — I2510 Atherosclerotic heart disease of native coronary artery without angina pectoris: Secondary | ICD-10-CM | POA: Diagnosis not present

## 2018-02-05 DIAGNOSIS — E785 Hyperlipidemia, unspecified: Secondary | ICD-10-CM | POA: Diagnosis not present

## 2018-02-05 DIAGNOSIS — M545 Low back pain: Secondary | ICD-10-CM | POA: Diagnosis not present

## 2018-02-05 DIAGNOSIS — I10 Essential (primary) hypertension: Secondary | ICD-10-CM | POA: Diagnosis not present

## 2018-02-06 DIAGNOSIS — M1991 Primary osteoarthritis, unspecified site: Secondary | ICD-10-CM | POA: Diagnosis not present

## 2018-02-06 DIAGNOSIS — Z96653 Presence of artificial knee joint, bilateral: Secondary | ICD-10-CM | POA: Diagnosis not present

## 2018-02-06 DIAGNOSIS — Z791 Long term (current) use of non-steroidal anti-inflammatories (NSAID): Secondary | ICD-10-CM | POA: Diagnosis not present

## 2018-02-06 DIAGNOSIS — Z7982 Long term (current) use of aspirin: Secondary | ICD-10-CM | POA: Diagnosis not present

## 2018-02-06 DIAGNOSIS — I10 Essential (primary) hypertension: Secondary | ICD-10-CM | POA: Diagnosis not present

## 2018-02-06 DIAGNOSIS — I2119 ST elevation (STEMI) myocardial infarction involving other coronary artery of inferior wall: Secondary | ICD-10-CM | POA: Diagnosis not present

## 2018-02-06 DIAGNOSIS — Z7952 Long term (current) use of systemic steroids: Secondary | ICD-10-CM | POA: Diagnosis not present

## 2018-02-06 DIAGNOSIS — J449 Chronic obstructive pulmonary disease, unspecified: Secondary | ICD-10-CM | POA: Diagnosis not present

## 2018-02-06 DIAGNOSIS — E782 Mixed hyperlipidemia: Secondary | ICD-10-CM | POA: Diagnosis not present

## 2018-02-08 DIAGNOSIS — E782 Mixed hyperlipidemia: Secondary | ICD-10-CM | POA: Diagnosis not present

## 2018-02-08 DIAGNOSIS — Z791 Long term (current) use of non-steroidal anti-inflammatories (NSAID): Secondary | ICD-10-CM | POA: Diagnosis not present

## 2018-02-08 DIAGNOSIS — Z96653 Presence of artificial knee joint, bilateral: Secondary | ICD-10-CM | POA: Diagnosis not present

## 2018-02-08 DIAGNOSIS — I2119 ST elevation (STEMI) myocardial infarction involving other coronary artery of inferior wall: Secondary | ICD-10-CM | POA: Diagnosis not present

## 2018-02-08 DIAGNOSIS — J449 Chronic obstructive pulmonary disease, unspecified: Secondary | ICD-10-CM | POA: Diagnosis not present

## 2018-02-08 DIAGNOSIS — Z7952 Long term (current) use of systemic steroids: Secondary | ICD-10-CM | POA: Diagnosis not present

## 2018-02-08 DIAGNOSIS — Z7982 Long term (current) use of aspirin: Secondary | ICD-10-CM | POA: Diagnosis not present

## 2018-02-08 DIAGNOSIS — M1991 Primary osteoarthritis, unspecified site: Secondary | ICD-10-CM | POA: Diagnosis not present

## 2018-02-08 DIAGNOSIS — I10 Essential (primary) hypertension: Secondary | ICD-10-CM | POA: Diagnosis not present

## 2018-02-19 DIAGNOSIS — H353221 Exudative age-related macular degeneration, left eye, with active choroidal neovascularization: Secondary | ICD-10-CM | POA: Diagnosis not present

## 2018-02-19 DIAGNOSIS — H353132 Nonexudative age-related macular degeneration, bilateral, intermediate dry stage: Secondary | ICD-10-CM | POA: Diagnosis not present

## 2018-02-19 DIAGNOSIS — H35371 Puckering of macula, right eye: Secondary | ICD-10-CM | POA: Diagnosis not present

## 2018-02-19 DIAGNOSIS — H353211 Exudative age-related macular degeneration, right eye, with active choroidal neovascularization: Secondary | ICD-10-CM | POA: Diagnosis not present

## 2018-02-23 ENCOUNTER — Other Ambulatory Visit: Payer: Self-pay | Admitting: Physician Assistant

## 2018-02-23 MED ORDER — LISINOPRIL 20 MG PO TABS: 20.0000 mg | ORAL_TABLET | Freq: Every day | ORAL | 3 refills | Status: DC

## 2018-02-26 DIAGNOSIS — H353211 Exudative age-related macular degeneration, right eye, with active choroidal neovascularization: Secondary | ICD-10-CM | POA: Diagnosis not present

## 2018-02-26 DIAGNOSIS — H43811 Vitreous degeneration, right eye: Secondary | ICD-10-CM | POA: Diagnosis not present

## 2018-02-26 DIAGNOSIS — H35371 Puckering of macula, right eye: Secondary | ICD-10-CM | POA: Diagnosis not present

## 2018-02-26 DIAGNOSIS — H401131 Primary open-angle glaucoma, bilateral, mild stage: Secondary | ICD-10-CM | POA: Diagnosis not present

## 2018-03-05 ENCOUNTER — Ambulatory Visit: Payer: Medicare Other | Admitting: Physician Assistant

## 2018-03-10 ENCOUNTER — Encounter: Payer: Self-pay | Admitting: Physician Assistant

## 2018-03-10 NOTE — Progress Notes (Signed)
Cardiology Office Note    Date:  03/12/2018  ID:  Beverly [name], DOB 16-Oct-1932, MRN [id number] PCP:  Beverly Baars, MD  Cardiologist:  Beverly Bollman, MD -> but pt will transition to Dr. Diona [name] as she lives in [place] and her husband follows there   Chief Complaint: f/u CAD  History of Present Illness:  Beverly [name] is a 85 y.o. female with history of HTN, COPD, HLD, recent CAD/STEMI with cardiac arrest (polymorphic VT), viral hearing loss 01/2018, anemia, mild carotid disease (1-39% BICA 02/2018) who presents for cardiac follow-up.  She was admitted 01/2018 with severe chest pain with radiation into neck and jaw. In EMS truck, she was found to have inferior ST elevations and a code STEMI was called initially.EKG changes stated started to reduce and interventional took a look at the imaging ECGs and code STEMI was canceled. She receive a dose of nitroglycerin in the EMS truck and chest pain resolved however she became hypotensive. In the ER at time of cardiology evaluation she was asymptomatic and ST segments had resolved. However, she developed recurrent CP and ST elevation prompting facilitation of cath. While awaiting Cath Lab team patient had episode of likely ventricular tachycardia polymorphic in which she lost her pulse and was unresponsive and required CPR, defibrillation and amiodarone. After about a 5-minute code this resolved and patient was back to her baseline, alert, oriented, and able to protect her airway. On 01/10/2018 she was taken emergently to the cath lab in which a successful PCI/DES was performed to the Beverly [name] with a 99% stenosed lesion. No other significant disease was noted. Echocardiogram performed 01/10/2018 showed EF 55% with basal to mid inferior akinesis and basal inferoseptal akinesis, mild aortic stenosis, normal RV. She was started on post-MI therapy. She did have chest soreness s/p CPR and extensive post-cath ecchymosis. Carotid duplex 01/26/18 for carotid bruits  showed 1-39% stenosis. She was seen in the ER 01/14/18 for sudden right hearing loss felt related to viral nerve infection and was treated with steroids. Otherwise at f/u 01/19/18 she was doing fairly well. We increased lisinopril for uncontrolled HTN. Last labs 01/19/18 showed Cr 0.66, K 4.7, Hgb 9.7 (up from 8.7), WBC 15k, plt 665 (instructed to see PCP to follow blood counts), Mg 1.7.   She returns for follow-up overall feeling well without recurrent angina or dyspnea. Initial BP was 140/78 by tech but 180/92 by me with recheck a few minutes later in other arm as well. She has not been following this at home as previously recommended. It seems from recent visits it's been running higher in general. Her previous cath bruising has resolved.   Past Medical History:  Diagnosis Date  . Anxiety   . Arthritis   . COPD (chronic obstructive pulmonary disease) (HCC)   . Coronary artery disease    a. STEMI with cardiac arrest (polymorphic VT) s/p DES to RCA.  Marland Kitchen GERD (gastroesophageal reflux disease)   . Headache(784.0)   . Hearing loss, central    Left ear  . Hematoma   . Hypertension   . Hypokalemia   . Memory changes   . Mild aortic stenosis   . Mild carotid artery disease (HCC)    a. 1-39% by duplex 02/2018.  . Right carotid bruit   . Seizures (HCC) 10/2012   no seizures, speech issues; after a fall, hitting head on left side  . VT (ventricular tachycardia) (HCC)     Past Surgical History:  Procedure Laterality Date  .  ABDOMINAL HYSTERECTOMY    . APPENDECTOMY    . CARDIAC CATHETERIZATION     15 yrs ago  . CORONARY/GRAFT ACUTE MI REVASCULARIZATION N/A 01/10/2018   Procedure: Coronary/Graft Acute MI Revascularization;  Surgeon: Beverly Gess, MD;  Location: [name] INVASIVE CV LAB;  Service: Cardiovascular;  Laterality: N/A;  . CRANIOTOMY Left 10/30/2012   Procedure: CRANIOTOMY HEMATOMA EVACUATION SUBDURAL;  Surgeon: Beverly Harman, MD;  Location: [facility] NEURO ORS;  Service: Neurosurgery;   Laterality: Left;  Left Craniotomy for evacuation of subdural hematoma  . JOINT REPLACEMENT Bilateral    knees  . LEFT HEART CATH AND CORONARY ANGIOGRAPHY N/A 01/10/2018   Procedure: LEFT HEART CATH AND CORONARY ANGIOGRAPHY;  Surgeon: Beverly Gess, MD;  Location: [facility] INVASIVE CV LAB;  Service: Cardiovascular;  Laterality: N/A;    Current Medications: Current Meds  Medication Sig  . ALPRAZolam (XANAX) 0.5 MG tablet Take 0.5 mg by mouth 3 (three) times daily as needed for anxiety.  Marland Kitchen aspirin 81 MG chewable tablet Chew 1 tablet (81 mg total) by mouth daily.  Marland Kitchen atorvastatin (LIPITOR) 80 MG tablet Take 1 tablet (80 mg total) by mouth daily at 6 PM.  . celecoxib (CELEBREX) 200 MG capsule Take 200 mg by mouth daily.  . cholecalciferol (VITAMIN D) 1000 UNITS tablet Take 1,000 Units by mouth daily.   . fluticasone (FLONASE) 50 MCG/ACT nasal spray Place 1 spray into both nostrils daily.   Marland Kitchen HYDROcodone-acetaminophen (NORCO) 10-325 MG tablet Take 1 tablet by mouth every 6 (six) hours as needed.  . latanoprost (XALATAN) 0.005 % ophthalmic solution   . levETIRAcetam (KEPPRA) 500 MG tablet Take 500 mg by mouth 2 (two) times daily.  Marland Kitchen lisinopril (PRINIVIL,ZESTRIL) 20 MG tablet Take 1 tablet (20 mg total) by mouth daily.  . methocarbamol (ROBAXIN) 750 MG tablet Take 500 mg by mouth 2 (two) times daily.   . metoprolol succinate (TOPROL-XL) 25 MG 24 hr tablet Take 1 tablet (25 mg total) by mouth daily.  . Multiple Vitamins-Minerals (PRESERVISION/LUTEIN PO) Take 1 tablet by mouth 2 (two) times daily.  . nitroGLYCERIN (NITROSTAT) 0.4 MG SL tablet Place 1 tablet (0.4 mg total) under the tongue every 5 (five) minutes x 3 doses as needed for chest pain.  . pantoprazole (PROTONIX) 40 MG tablet Take 40 mg by mouth daily.  . potassium chloride (K-DUR) 10 MEQ tablet Take 10 mEq by mouth daily.   Marland Kitchen Propylene Glycol (SYSTANE BALANCE OP) Place 1-2 drops into both eyes daily as needed (dry eyes).  . ticagrelor  (BRILINTA) 90 MG TABS tablet Take 1 tablet (90 mg total) by mouth 2 (two) times daily.      Allergies:   Eggs or egg-derived products; Morphine and related; and Penicillins   Social History   Socioeconomic History  . Marital status: Married    Spouse name: Not on file  . Number of children: Not on file  . Years of education: Not on file  . Highest education level: Not on file  Occupational History  . Not on file  Social Needs  . Financial resource strain: Not on file  . Food insecurity:    Worry: Not on file    Inability: Not on file  . Transportation needs:    Medical: Not on file    Non-medical: Not on file  Tobacco Use  . Smoking status: Never Smoker  . Smokeless tobacco: Never Used  Substance and Sexual Activity  . Alcohol use: No  . Drug use: No  .  Sexual activity: Not on file  Lifestyle  . Physical activity:    Days per week: Not on file    Minutes per session: Not on file  . Stress: Not on file  Relationships  . Social connections:    Talks on phone: Not on file    Gets together: Not on file    Attends religious service: Not on file    Active member of club or organization: Not on file    Attends meetings of clubs or organizations: Not on file    Relationship status: Not on file  Other Topics Concern  . Not on file  Social History Narrative  . Not on file     Family History:  The patient's family history is negative for Ataxia, Chorea, Dementia, Mental retardation, Migraines, Multiple sclerosis, Neurofibromatosis, Neuropathy, Parkinsonism, Seizures, and Stroke.  ROS:   Please see the history of present illness.  All other systems are reviewed and otherwise negative.    PHYSICAL EXAM:   VS:  BP (!) 180/92   Pulse 73   Ht 5' 0.5" (1.537 m)   Wt 112 lb (50.8 kg)   SpO2 97%   BMI 21.51 kg/m   BMI: Body mass index is 21.51 kg/m. GEN: Well nourished, well developed WF, in no acute distress HEENT: normocephalic, atraumatic Neck: no JVD, soft  bilateral carotid bruits, no masses Cardiac: RRR; soft SEM RUSB, no rubs or gallops, no edema  Respiratory:  clear to auscultation bilaterally, normal work of breathing GI: soft, nontender, nondistended, + BS MS: no deformity or atrophy Skin: warm and dry, no rash Neuro:  Alert and Oriented x 3, Strength and sensation are intact, follows commands Psych: euthymic mood, full affect  Wt Readings from Last 3 Encounters:  03/12/18 112 lb (50.8 kg)  01/19/18 116 lb 1.9 oz (52.7 kg)  01/12/18 113 lb 2 oz (51.3 kg)      Studies/Labs Reviewed:   EKG:   EKG was not ordered today.  Recent Labs: 01/10/2018: ALT <5 01/19/2018: BUN 17; Creatinine, Ser 0.66; Hemoglobin 9.7; Magnesium 1.7; Platelets 665; Potassium 4.7; Sodium 134   Lipid Panel    Component Value Date/Time   CHOL 151 01/10/2018 0428   TRIG 39 01/10/2018 0428   HDL 82 01/10/2018 0428   CHOLHDL 1.8 01/10/2018 0428   VLDL 8 01/10/2018 0428   LDLCALC 61 01/10/2018 0428    Additional studies/ records that were reviewed today include: Summarized above   ASSESSMENT & PLAN:   1. CAD with recent STEMI - stable post-PCI. Continue ASA, Brilinta, BB, statin. Check CMET and lipids today. See below regarding med titration for BP. 2. Ventricular tachycardia - check K and Mg again today.If K is normal may not need potassium supplement; will plan to review. 3. Essential HTN - BP remains elevated. Lisinopril increase didn't make much of a difference. Will change metoprolol to carvedilol 6.25mg  BID. I offered nurse BP check but she declined. They wish to follow at home. I instructed her to check BP daily 2-3 hours after meds and call with readings later this week (or Mychart them). If it remains uncontrolled would titrate lisinopril to  with further monitoring. Check BMET since we titrated lisinopril. Check TSH with labs as she had not had a level recently. We had previously discussed limiting NSAID use I.e. Celebrex but she says she  reviewed with MD in the [name] at time of her MI who felt it was OK to continue. She will monitor for any bleeding.  4. Mild carotid disease - anticipate repeat due in another 1-2 years; will defer to primary cardiologist. Bruit may be radiation from AS. 5. Mild aortic stenosis - continue clinical surveillance for now. Consider repeat echo in 2-3 years or as indicated.  Disposition: The patient lives in [place] and her husband sees Dr. Diona [name]. For convenience sake, she would prefer following up with him out there. Will send message to both MDs to make them aware but I do not suspect this will be a problem at all. I reminded her we are all interconnected and not to hesitate to call either location for questions.  Medication Adjustments/Labs and Tests Ordered: Current medicines are reviewed at length with the patient today.  Concerns regarding medicines are outlined above. Medication changes, Labs and Tests ordered today are summarized above and listed in the Patient Instructions accessible in Encounters.   Signed, Laurann Montana, PA-C  03/12/2018 2:14 PM    [name] [address] [place], [place], [place]  [address] Phone: [phone]; Fax: [phone]

## 2018-03-12 ENCOUNTER — Encounter (INDEPENDENT_AMBULATORY_CARE_PROVIDER_SITE_OTHER): Payer: Self-pay

## 2018-03-12 ENCOUNTER — Encounter: Payer: Self-pay | Admitting: Physician Assistant

## 2018-03-12 ENCOUNTER — Ambulatory Visit: Payer: Medicare Other | Admitting: Physician Assistant

## 2018-03-12 VITALS — BP 180/92 | HR 73 | Ht 60.5 in | Wt 112.0 lb

## 2018-03-12 DIAGNOSIS — I10 Essential (primary) hypertension: Secondary | ICD-10-CM

## 2018-03-12 DIAGNOSIS — I350 Nonrheumatic aortic (valve) stenosis: Secondary | ICD-10-CM

## 2018-03-12 DIAGNOSIS — I472 Ventricular tachycardia, unspecified: Secondary | ICD-10-CM

## 2018-03-12 DIAGNOSIS — I739 Peripheral vascular disease, unspecified: Secondary | ICD-10-CM | POA: Diagnosis not present

## 2018-03-12 DIAGNOSIS — I2510 Atherosclerotic heart disease of native coronary artery without angina pectoris: Secondary | ICD-10-CM | POA: Diagnosis not present

## 2018-03-12 DIAGNOSIS — I779 Disorder of arteries and arterioles, unspecified: Secondary | ICD-10-CM

## 2018-03-12 MED ORDER — CARVEDILOL 6.25 MG PO TABS: 6.2500 mg | ORAL_TABLET | Freq: Two times a day (BID) | ORAL | 0 refills | Status: DC

## 2018-03-12 NOTE — Patient Instructions (Addendum)
Medication Instructions:  1) STOP METOPROLOL 2) START CARVEDILOL (Coreg) 6.25 mg twice daily  Labwork: TODAY: CMET, Magnesium, TSH, lipids  Follow-Up: Dr. Ival Bible office will call you to arrange an appointment.   Any Other Special Instructions Will Be Listed Below (If Applicable). Please monitor your blood pressure once a day after making the new blood pressure medicine change, at least 2-3 hours after taking your blood pressure medicines. You should be seated in a chair relaxed with your feet on the ground for a few minutes before checking it. Please call us with your readings later this week to decide on further medicine adjustment. You can also send in a Mychart message.

## 2018-03-13 ENCOUNTER — Telehealth: Payer: Self-pay

## 2018-03-13 LAB — COMPREHENSIVE METABOLIC PANEL
ALBUMIN: 3.8 g/dL (ref 3.6–4.6)
ALT: 16 IU/L (ref 0–32)
AST: 29 IU/L (ref 0–40)
Albumin/Globulin Ratio: 1.7 (ref 1.2–2.2)
Alkaline Phosphatase: 125 IU/L — ABNORMAL HIGH (ref 39–117)
BUN/Creatinine Ratio: 22 (ref 12–28)
BUN: 15 mg/dL (ref 8–27)
Bilirubin Total: 0.5 mg/dL (ref 0.0–1.2)
CO2: 25 mmol/L (ref 20–29)
CREATININE: 0.69 mg/dL (ref 0.57–1.00)
Calcium: 9.3 mg/dL (ref 8.7–10.3)
Chloride: 101 mmol/L (ref 96–106)
GFR calc Af Amer: 93 mL/min/{1.73_m2} (ref >59)
GFR calc non Af Amer: 81 mL/min/{1.73_m2} (ref >59)
Globulin, Total: 2.2 g/dL (ref 1.5–4.5)
Glucose: 93 mg/dL (ref 65–99)
Potassium: 4.5 mmol/L (ref 3.5–5.2)
Sodium: 141 mmol/L (ref 134–144)
Total Protein: 6 g/dL (ref 6.0–8.5)

## 2018-03-13 LAB — LIPID PANEL
Chol/HDL Ratio: 1.4 ratio (ref 0.0–4.4)
Cholesterol, Total: 134 mg/dL (ref 100–199)
HDL: 97 mg/dL (ref >39)
LDL Calculated: 29 mg/dL (ref 0–99)
Triglycerides: 41 mg/dL (ref 0–149)
VLDL Cholesterol Cal: 8 mg/dL (ref 5–40)

## 2018-03-13 LAB — MAGNESIUM: Magnesium: 1.7 mg/dL (ref 1.6–2.3)

## 2018-03-13 LAB — TSH: TSH: 0.924 u[IU]/mL (ref 0.450–4.500)

## 2018-03-13 MED ORDER — MAGNESIUM OXIDE 400 MG PO CAPS: 400.0000 mg | ORAL_CAPSULE | Freq: Every day | ORAL | 3 refills | Status: AC

## 2018-03-13 NOTE — Telephone Encounter (Signed)
Spoke with pt's daughter [name] (DPR on file) regarding recent results, lab draw appt set for 03/23/2018 at [facility] location as it is closer to patient. MgOx 400mg  ordered for pt. Pharmacy concerned. [name] verbalized understanding.

## 2018-03-13 NOTE — Telephone Encounter (Signed)
-----  Message from Charlie Pitter, [place] sent at 03/13/2018 10:59 AM EDT ----- Please let pt know overall labs look really good. I told her we would check to see whether she needed to continue K supplement. Her potassium level looks perfect on her regimen so I Would continue for now. Her magnesium level hasn't really budged much now that she has recovered from MI. Since she has a history of VT I would advise we get this level closer to 2.0 so would advise MagOx '400mg'$ . Please increase dietary intake of healthy sources of magnesium including leafy greens, nuts, seeds, fish, beans, whole grains, avocados, yogurt, and bananas. LDL looks great. Alk phos (marker of liver/bone) is very slightly elevated. Let's get a recheck magnesium level in 10 days along with repeat LFTs at that time. Pt lives in [place] so maybe coordinate there if more convenient. Dayna Dunn PA-C

## 2018-03-15 NOTE — Progress Notes (Signed)
Of course fine with me. Thanks!

## 2018-03-24 LAB — HEPATIC FUNCTION PANEL
ALK PHOS: 164 IU/L — AB (ref 39–117)
ALT: 23 IU/L (ref 0–32)
AST: 39 IU/L (ref 0–40)
Albumin: 4 g/dL (ref 3.6–4.6)
BILIRUBIN, DIRECT: 0.19 mg/dL (ref 0.00–0.40)
Bilirubin Total: 0.4 mg/dL (ref 0.0–1.2)
Total Protein: 6 g/dL (ref 6.0–8.5)

## 2018-03-24 LAB — MAGNESIUM: Magnesium: 1.8 mg/dL (ref 1.6–2.3)

## 2018-04-03 ENCOUNTER — Other Ambulatory Visit: Payer: Self-pay | Admitting: Physician Assistant

## 2018-04-11 ENCOUNTER — Telehealth: Payer: Self-pay

## 2018-04-11 NOTE — Telephone Encounter (Signed)
Virtual Visit Pre-Appointment Phone Call  Steps For Call:  1. Confirm consent - "In the setting of the current Covid19 crisis, you are scheduled for a (phone or video) visit with your provider on (date) at (time).  Just as we do with many in-office visits, in order for you to participate in this visit, we must obtain consent.  If you'd like, I can send this to your mychart (if signed up) or email for you to review.  Otherwise, I can obtain your verbal consent now.  All virtual visits are billed to your insurance company just like a normal visit would be.  By agreeing to a virtual visit, we'd like you to understand that the technology does not allow for your provider to perform an examination, and thus may limit your provider's ability to fully assess your condition.  Finally, though the technology is pretty good, we cannot assure that it will always work on either your or our end, and in the setting of a video visit, we may have to convert it to a phone-only visit.  In either situation, we cannot ensure that we have a secure connection.  Are you willing to proceed?"  2. Give patient instructions for WebEx download to smartphone as below if video visit  3. Advise patient to be prepared with any vital sign or heart rhythm information, their current medicines, and a piece of paper and pen handy for any instructions they may receive the day of their visit  4. Inform patient they will receive a phone call 15 minutes prior to their appointment time (may be from unknown caller ID) so they should be prepared to answer  5. Confirm that appointment type is correct in Epic appointment notes (video vs telephone)    TELEPHONE CALL NOTE  Beverly [name] has been deemed a candidate for a follow-up tele-health visit to limit community exposure during the Covid-19 pandemic. I spoke with the patient via phone to ensure availability of phone/video source, confirm preferred email & phone number, and discuss  instructions and expectations.  I reminded Beverly [name] to be prepared with any vital sign and/or heart rhythm information that could potentially be obtained via home monitoring, at the time of her visit. I reminded Beverly [name] to expect a phone call at the time of her visit if her visit.  Did the patient verbally acknowledge consent to treatment? Yes   Fonnie Birkenhead, CMA 04/11/2018 10:16 AM   DOWNLOADING THE WEBEX SOFTWARE TO SMARTPHONE  - If Apple, go to [employer] and type in WebEx in the search bar. Download [employer], the blue/green circle. The app is free but as with any other app downloads, their phone may require them to verify saved payment information or Apple password. The patient does NOT have to create an account.  - If Android, ask patient to go to [employer] and type in WebEx in the search bar. Download [employer], the blue/green circle. The app is free but as with any other app downloads, their phone may require them to verify saved payment information or Android password. The patient does NOT have to create an account.   CONSENT FOR TELE-HEALTH VISIT - PLEASE REVIEW  I hereby voluntarily request, consent and authorize [facility] and its employed or contracted physicians, physician assistants, nurse practitioners or other licensed health care professionals (the Practitioner), to provide me with telemedicine health care services (the "Services") as deemed necessary by the treating  Practitioner. I acknowledge and consent to receive the Services by the Practitioner via telemedicine. I understand that the telemedicine visit will involve communicating with the Practitioner through live audiovisual communication technology and the disclosure of certain medical information by electronic transmission. I acknowledge that I have been given the opportunity to request an in-person assessment or other available alternative prior to the telemedicine visit and am  voluntarily participating in the telemedicine visit.  I understand that I have the right to withhold or withdraw my consent to the use of telemedicine in the course of my care at any time, without affecting my right to future care or treatment, and that the Practitioner or I may terminate the telemedicine visit at any time. I understand that I have the right to inspect all information obtained and/or recorded in the course of the telemedicine visit and may receive copies of available information for a reasonable fee.  I understand that some of the potential risks of receiving the Services via telemedicine include:  Marland Kitchen Delay or interruption in medical evaluation due to technological equipment failure or disruption; . Information transmitted may not be sufficient (e.g. poor resolution of images) to allow for appropriate medical decision making by the Practitioner; and/or  . In rare instances, security protocols could fail, causing a breach of personal health information.  Furthermore, I acknowledge that it is my responsibility to provide information about my medical history, conditions and care that is complete and accurate to the best of my ability. I acknowledge that Practitioner's advice, recommendations, and/or decision may be based on factors not within their control, such as incomplete or inaccurate data provided by me or distortions of diagnostic images or specimens that may result from electronic transmissions. I understand that the practice of medicine is not an exact science and that Practitioner makes no warranties or guarantees regarding treatment outcomes. I acknowledge that I will receive a copy of this consent concurrently upon execution via email to the email address I last provided but may also request a printed copy by calling the office of [facility].    I understand that my insurance will be billed for this visit.   I have read or had this consent read to me. . I understand the  contents of this consent, which adequately explains the benefits and risks of the Services being provided via telemedicine.  . I have been provided ample opportunity to ask questions regarding this consent and the Services and have had my questions answered to my satisfaction. . I give my informed consent for the services to be provided through the use of telemedicine in my medical care  By participating in this telemedicine visit I agree to the above.

## 2018-04-16 ENCOUNTER — Encounter: Payer: Self-pay | Admitting: Cardiology

## 2018-04-16 ENCOUNTER — Telehealth: Payer: Self-pay

## 2018-04-16 NOTE — Progress Notes (Signed)
Virtual Visit via Telephone Note   This visit type was conducted due to national recommendations for restrictions regarding the COVID-19 Pandemic (e.g. social distancing) in an effort to limit this patient's exposure and mitigate transmission in our community.  Due to her co-morbid illnesses, this patient is at least at moderate risk for complications without adequate follow up.  This format is felt to be most appropriate for this patient at this time.  The patient did not have access to video technology/had technical difficulties with video requiring transitioning to audio format only (telephone).  All issues noted in this document were discussed and addressed.  No physical exam could be performed with this format.  Please refer to the patient's chart for her  consent to telehealth for [facility].   Evaluation Performed:  Follow-up visit  Date:  04/17/2018   ID:  Beverly [name], DOB 08/05/1932, MRN [id number]  Patient Location: Home  Provider Location: Home  PCP:  Kari BaarsHawkins, Edward, MD  Cardiologist:  Tonny BollmanMichael Cooper, MD  Chief Complaint: Follow-up CAD and symptom control  History of Present Illness:    Beverly [name] is an [age] y.o. female who presents via audio/video conferencing for a telehealth visit today.  We spoke by phone today.  This is our first meeting (her husband is my patient), she is transitioning from Dr. Excell Seltzerooper.  I reviewed extensive records.  She was most recently seen by Ms. Dunn PA-C in early March.  She tells me that she has had no angina symptoms or nitroglycerin use since last encounter in March.  She does some walking around her house, but is limited by diffuse arthritic pain to some degree.  Daughter was also present on the phone as we talked, voiced no specific concerns.  She does help her mother with regular medications.  I reviewed lab work from March as outlined below.  We also went over her cardiac regimen which is outlined below.  She reports compliance and no  obvious intolerances.  No bleeding problems on dual antiplatelet therapy.  The patient does not have symptoms concerning for COVID-19 infection (fever, chills, cough, or new shortness of breath).  He has been staying at her house.   Past Medical History:  Diagnosis Date  . Anxiety   . Arthritis   . COPD (chronic obstructive pulmonary disease) (HCC)   . Coronary artery disease    a. STEMI with cardiac arrest (polymorphic VT) s/p DES to RCA.  Marland Kitchen. GERD (gastroesophageal reflux disease)   . Headache(784.0)   . Hearing loss, central    Left ear  . Hematoma   . Hypertension   . Memory changes   . Mild aortic stenosis   . Mild carotid artery disease (HCC)    a. 1-39% by duplex 02/2018.  Marland Kitchen. Seizures (HCC) 10/2012   No seizures, speech issues; after a fall, hitting head on left side   Past Surgical History:  Procedure Laterality Date  . ABDOMINAL HYSTERECTOMY    . APPENDECTOMY    . CARDIAC CATHETERIZATION     15 yrs ago  . CORONARY/GRAFT ACUTE MI REVASCULARIZATION N/A 01/10/2018   Procedure: Coronary/Graft Acute MI Revascularization;  Surgeon: Runell GessBerry, Jonathan J, MD;  [facility];  Service: Cardiovascular;  Laterality: N/A;  . CRANIOTOMY Left 10/30/2012   Procedure: CRANIOTOMY HEMATOMA EVACUATION SUBDURAL;  Surgeon: Maeola HarmanJoseph Stern, MD;  Location: [facility] NEURO ORS;  Service: Neurosurgery;  Laterality: Left;  Left Craniotomy for evacuation of subdural hematoma  . JOINT REPLACEMENT Bilateral  knees  . LEFT HEART CATH AND CORONARY ANGIOGRAPHY N/A 01/10/2018   Procedure: LEFT HEART CATH AND CORONARY ANGIOGRAPHY;  Surgeon: Runell Gess, MD;  Location: [facility] INVASIVE CV LAB;  Service: Cardiovascular;  Laterality: N/A;     Current Meds  Medication Sig  . ALPRAZolam (XANAX) 0.5 MG tablet Take 0.5 mg by mouth 3 (three) times daily as needed for anxiety.  Marland Kitchen aspirin 81 MG chewable tablet Chew 1 tablet (81 mg total) by mouth daily.  Marland Kitchen atorvastatin (LIPITOR) 80 MG tablet Take 1 tablet (80  mg total) by mouth daily at 6 PM.  . carvedilol (COREG) 6.25 MG tablet TAKE ONE TABLET (6.25MG  TOTAL) BY MOUTH TWO TIMES DAILY  . celecoxib (CELEBREX) 200 MG capsule Take 200 mg by mouth daily.  . cholecalciferol (VITAMIN D) 1000 UNITS tablet Take 1,000 Units by mouth daily.   . fluticasone (FLONASE) 50 MCG/ACT nasal spray Place 1 spray into both nostrils daily.   Marland Kitchen HYDROcodone-acetaminophen (NORCO) 10-325 MG tablet Take 1 tablet by mouth every 6 (six) hours as needed.  . latanoprost (XALATAN) 0.005 % ophthalmic solution   . levETIRAcetam (KEPPRA) 500 MG tablet Take 500 mg by mouth 2 (two) times daily.  Marland Kitchen lisinopril (PRINIVIL,ZESTRIL) 20 MG tablet Take 1 tablet (20 mg total) by mouth daily.  . Magnesium Oxide 400 MG CAPS Take 1 capsule (400 mg total) by mouth daily.  . methocarbamol (ROBAXIN) 750 MG tablet Take 500 mg by mouth 2 (two) times daily.   . Multiple Vitamins-Minerals (PRESERVISION/LUTEIN PO) Take 1 tablet by mouth 2 (two) times daily.  . nitroGLYCERIN (NITROSTAT) 0.4 MG SL tablet Place 1 tablet (0.4 mg total) under the tongue every 5 (five) minutes x 3 doses as needed for chest pain.  . pantoprazole (PROTONIX) 40 MG tablet Take 40 mg by mouth daily.  . potassium chloride (K-DUR) 10 MEQ tablet Take 10 mEq by mouth daily.   Marland Kitchen Propylene Glycol (SYSTANE BALANCE OP) Place 1-2 drops into both eyes daily as needed (dry eyes).  . ticagrelor (BRILINTA) 90 MG TABS tablet Take 1 tablet (90 mg total) by mouth 2 (two) times daily.     Allergies:   Eggs or egg-derived products; Morphine and related; and Penicillins   Social History   Tobacco Use  . Smoking status: Never Smoker  . Smokeless tobacco: Never Used  Substance Use Topics  . Alcohol use: No  . Drug use: No     Family Hx: The patient's family history is negative for Ataxia, Chorea, Dementia, Mental retardation, Migraines, Multiple sclerosis, Neurofibromatosis, Neuropathy, Parkinsonism, Seizures, and Stroke.  ROS:   Please see  the history of present illness.    All other systems reviewed and are negative.   Prior CV studies:   The following studies were reviewed today:  Carotid Dopplers 01/26/2018: Summary: Right Carotid: Velocities in the right ICA are consistent with a 1-39% stenosis.                The RICA velocities remain within normal range and essentially                stable compared to the prior exam.  Left Carotid: Velocities in the left ICA are consistent with a 1-39% stenosis.               The LICA velocities remain within normal range and have decreased               compared to the prior exam.  Vertebrals:  Bilateral vertebral arteries demonstrate antegrade flow. Subclavians: Normal flow hemodynamics were seen in bilateral subclavian              arteries.  Echocardiogram 01/10/2018: Study Conclusions  - Left ventricle: The cavity size was normal. Wall thickness was   increased in a pattern of mild LVH. Basal to mid inferior   akinesis. Basal inferoseptal akinesis. The estimated ejection   fraction was 55%. Doppler parameters are consistent with abnormal   left ventricular relaxation (grade 1 diastolic dysfunction). - Aortic valve: Trileaflet; moderately calcified leaflets. There   was mild stenosis. Mean gradient (S): 13 mm Hg. Valve area   (Vmean): 1.74 cm^2. - Mitral valve: There was trivial regurgitation. - Left atrium: The atrium was mildly dilated. - Right ventricle: The cavity size was normal. Systolic function   was normal. - Pulmonary arteries: No complete TR doppler jet so unable to   estimate PA systolic pressure. - Inferior vena cava: The vessel was normal in size. The   respirophasic diameter changes were in the normal range (>= 50%),   consistent with normal central venous pressure.  Impressions:  - Normal LV size with mild LV hypertrophy. EF 55% with basal to mid   inferior akinesis and basal inferoseptal akinesis. Normal RV size   and systolic function. Mild  aortic stenosis.  Cardiac catheterization and PCI 01/10/2018:  Mid RCA lesion is 99% stenosed.  A drug-eluting stent was successfully placed.  Post intervention, there is a 0% residual stenosis.  IMPRESSION: Successful PCI and drug-eluting stenting of a occluded dominant RCA with faint left-to-right collaterals.  Vessel opened with injection and was subtotally occluded with visible thrombus.  The door to balloon time was 27 minutes.  The patient remained hemodynamically stable throughout the case on IV epinephrine and amiodarone.  Angiomax will continue for 4 hours full dose.  The sheath was then removed be removed and pressure held.  Her enzymes will be cycled.  2D echo has been ordered.  She will be treated with routine post MI pharmacology including high-dose statin drug, beta-blocker depending on her blood pressure.  She can probably be "fast tracked" given her normal left system.  She left the lab in stable condition.  Labs/Other Tests and Data Reviewed:    EKG: I personally reviewed the tracing from 01/19/2018 which showed normal sinus rhythm with PVC.  Recent Labs: 01/19/2018: Hemoglobin 9.7; Platelets 665 03/12/2018: BUN 15; Creatinine, Ser 0.69; Potassium 4.5; Sodium 141; TSH 0.924 03/23/2018: ALT 23; Magnesium 1.8   Recent Lipid Panel Lab Results  Component Value Date/Time   CHOL 134 03/12/2018 02:50 PM   TRIG 41 03/12/2018 02:50 PM   HDL 97 03/12/2018 02:50 PM   CHOLHDL 1.4 03/12/2018 02:50 PM   CHOLHDL 1.8 01/10/2018 04:28 AM   LDLCALC 29 03/12/2018 02:50 PM    Wt Readings from Last 3 Encounters:  04/17/18 105 lb (47.6 kg)  03/12/18 112 lb (50.8 kg)  01/19/18 116 lb 1.9 oz (52.7 kg)     Objective:    Vital Signs:  BP 135/75   Wt 105 lb (47.6 kg)   HC 5" (12.7 cm)   BMI 20.17 kg/m    Patient answered questions spontaneously.  She did not sound breathless while speaking in full sentences.  ASSESSMENT & PLAN:    1.  CAD status post inferior STEMI gated by VF arrest  back in January and status post DES to the RCA.  LVEF 55% by echocardiogram.  She is symptomatically stable without  angina on medical therapy, continues on dual antiplatelet regimen without bleeding problems.  Encouraged walking for regular exercise plan.  2.  Essential hypertension, systolic in the 130s today.  No changes to current regimen.  Keep follow-up with Dr. [name].  3.  Mild, asymptomatic carotid artery disease.  Continue antiplatelet regimen and statin.  4.  Lipids aggressively controlled, LDL 29.  COVID-19 Education: The signs and symptoms of COVID-19 were discussed with the patient and how to seek care for testing (follow up with PCP or arrange E-visit).  The importance of social distancing was discussed today.  Time:   Today, I have spent 9 minutes with the patient with telehealth technology discussing the above problems.     Medication Adjustments/Labs and Tests Ordered: Current medicines are reviewed at length with the patient today.  Concerns regarding medicines are outlined above.  Tests Ordered: No orders of the defined types were placed in this encounter.  Medication Changes: No orders of the defined types were placed in this encounter.   Disposition:  Follow up 6 months in [place] office  Signed, Nona Dell, MD  04/17/2018 1:22 PM    [facility]

## 2018-04-16 NOTE — Telephone Encounter (Signed)
Called pt to go over medication list. It is current and pharmacy updated. Asked pt's daughter to take her bp and she agreed. Explained the doximity text.

## 2018-04-17 ENCOUNTER — Encounter: Payer: Self-pay | Admitting: Cardiology

## 2018-04-17 ENCOUNTER — Telehealth (INDEPENDENT_AMBULATORY_CARE_PROVIDER_SITE_OTHER): Payer: Medicare Other | Admitting: Cardiology

## 2018-04-17 VITALS — BP 135/75 | Wt 105.0 lb

## 2018-04-17 DIAGNOSIS — I10 Essential (primary) hypertension: Secondary | ICD-10-CM

## 2018-04-17 DIAGNOSIS — E785 Hyperlipidemia, unspecified: Secondary | ICD-10-CM

## 2018-04-17 DIAGNOSIS — I6523 Occlusion and stenosis of bilateral carotid arteries: Secondary | ICD-10-CM

## 2018-04-17 DIAGNOSIS — I25119 Atherosclerotic heart disease of native coronary artery with unspecified angina pectoris: Secondary | ICD-10-CM | POA: Diagnosis not present

## 2018-04-17 NOTE — Patient Instructions (Signed)
Medication Instructions: Your physician recommends that you continue on your current medications as directed. Please refer to the Current Medication list given to you today.   Labwork: None today  Procedures/Testing: None today  Follow-Up: 6 months with Dr.McDowell in the [place] office  Any Additional Special Instructions Will Be Listed Below (If Applicable).     If you need a refill on your cardiac medications before your next appointment, please call your pharmacy.     Thank you for choosing [facility] !

## 2018-05-14 DIAGNOSIS — H43812 Vitreous degeneration, left eye: Secondary | ICD-10-CM | POA: Diagnosis not present

## 2018-05-14 DIAGNOSIS — H353221 Exudative age-related macular degeneration, left eye, with active choroidal neovascularization: Secondary | ICD-10-CM | POA: Diagnosis not present

## 2018-05-22 DIAGNOSIS — H353211 Exudative age-related macular degeneration, right eye, with active choroidal neovascularization: Secondary | ICD-10-CM | POA: Diagnosis not present

## 2018-05-22 DIAGNOSIS — H35371 Puckering of macula, right eye: Secondary | ICD-10-CM | POA: Diagnosis not present

## 2018-05-22 DIAGNOSIS — H43811 Vitreous degeneration, right eye: Secondary | ICD-10-CM | POA: Diagnosis not present

## 2018-06-26 DIAGNOSIS — H353211 Exudative age-related macular degeneration, right eye, with active choroidal neovascularization: Secondary | ICD-10-CM | POA: Diagnosis not present

## 2018-06-26 DIAGNOSIS — H43811 Vitreous degeneration, right eye: Secondary | ICD-10-CM | POA: Diagnosis not present

## 2018-06-26 DIAGNOSIS — H35371 Puckering of macula, right eye: Secondary | ICD-10-CM | POA: Diagnosis not present

## 2018-06-28 DIAGNOSIS — H353221 Exudative age-related macular degeneration, left eye, with active choroidal neovascularization: Secondary | ICD-10-CM | POA: Diagnosis not present

## 2018-06-28 DIAGNOSIS — H43812 Vitreous degeneration, left eye: Secondary | ICD-10-CM | POA: Diagnosis not present

## 2018-08-04 ENCOUNTER — Other Ambulatory Visit: Payer: Self-pay

## 2018-08-04 ENCOUNTER — Ambulatory Visit
Admission: RE | Admit: 2018-08-04 | Discharge: 2018-08-04 | Disposition: A | Discharge status: Home / Self Care | Payer: Medicare Other | Source: Ambulatory Visit | Attending: Neurosurgery | Admitting: Neurosurgery

## 2018-08-04 DIAGNOSIS — M48061 Spinal stenosis, lumbar region without neurogenic claudication: Secondary | ICD-10-CM | POA: Diagnosis not present

## 2018-08-04 DIAGNOSIS — M5412 Radiculopathy, cervical region: Secondary | ICD-10-CM

## 2018-08-04 DIAGNOSIS — M5416 Radiculopathy, lumbar region: Secondary | ICD-10-CM

## 2018-08-07 DIAGNOSIS — H35371 Puckering of macula, right eye: Secondary | ICD-10-CM | POA: Diagnosis not present

## 2018-08-07 DIAGNOSIS — H353211 Exudative age-related macular degeneration, right eye, with active choroidal neovascularization: Secondary | ICD-10-CM | POA: Diagnosis not present

## 2018-08-07 DIAGNOSIS — H353132 Nonexudative age-related macular degeneration, bilateral, intermediate dry stage: Secondary | ICD-10-CM | POA: Diagnosis not present

## 2018-08-09 DIAGNOSIS — I2510 Atherosclerotic heart disease of native coronary artery without angina pectoris: Secondary | ICD-10-CM | POA: Diagnosis not present

## 2018-08-09 DIAGNOSIS — M545 Low back pain: Secondary | ICD-10-CM | POA: Diagnosis not present

## 2018-08-09 DIAGNOSIS — I10 Essential (primary) hypertension: Secondary | ICD-10-CM | POA: Diagnosis not present

## 2018-08-09 DIAGNOSIS — M542 Cervicalgia: Secondary | ICD-10-CM | POA: Diagnosis not present

## 2018-08-10 ENCOUNTER — Other Ambulatory Visit (HOSPITAL_COMMUNITY): Payer: Self-pay | Admitting: Pulmonary Disease

## 2018-08-10 ENCOUNTER — Other Ambulatory Visit: Payer: Self-pay | Admitting: Pulmonary Disease

## 2018-08-10 DIAGNOSIS — M542 Cervicalgia: Secondary | ICD-10-CM

## 2018-08-22 ENCOUNTER — Other Ambulatory Visit: Payer: Self-pay

## 2018-08-22 ENCOUNTER — Ambulatory Visit (HOSPITAL_COMMUNITY)
Admission: RE | Admit: 2018-08-22 | Discharge: 2018-08-22 | Disposition: A | Discharge status: Home / Self Care | Payer: Medicare Other | Source: Ambulatory Visit | Attending: Pulmonary Disease | Admitting: Pulmonary Disease

## 2018-08-22 DIAGNOSIS — M542 Cervicalgia: Secondary | ICD-10-CM | POA: Diagnosis not present

## 2018-08-23 DIAGNOSIS — H43812 Vitreous degeneration, left eye: Secondary | ICD-10-CM | POA: Diagnosis not present

## 2018-08-23 DIAGNOSIS — H353221 Exudative age-related macular degeneration, left eye, with active choroidal neovascularization: Secondary | ICD-10-CM | POA: Diagnosis not present

## 2018-08-23 DIAGNOSIS — H35371 Puckering of macula, right eye: Secondary | ICD-10-CM | POA: Diagnosis not present

## 2018-09-04 ENCOUNTER — Other Ambulatory Visit: Payer: Self-pay | Admitting: Pulmonary Disease

## 2018-09-04 DIAGNOSIS — M542 Cervicalgia: Secondary | ICD-10-CM

## 2018-09-11 DIAGNOSIS — I2510 Atherosclerotic heart disease of native coronary artery without angina pectoris: Secondary | ICD-10-CM | POA: Diagnosis not present

## 2018-09-11 DIAGNOSIS — M542 Cervicalgia: Secondary | ICD-10-CM | POA: Diagnosis not present

## 2018-09-11 DIAGNOSIS — M545 Low back pain: Secondary | ICD-10-CM | POA: Diagnosis not present

## 2018-09-11 DIAGNOSIS — I10 Essential (primary) hypertension: Secondary | ICD-10-CM | POA: Diagnosis not present

## 2018-11-19 ENCOUNTER — Other Ambulatory Visit: Payer: Self-pay

## 2018-11-19 ENCOUNTER — Ambulatory Visit
Admission: EM | Admit: 2018-11-19 | Discharge: 2018-11-19 | Disposition: A | Discharge status: Home / Self Care | Payer: Medicare Other | Attending: Emergency Medicine | Admitting: Emergency Medicine

## 2018-11-19 DIAGNOSIS — H938X3 Other specified disorders of ear, bilateral: Secondary | ICD-10-CM | POA: Diagnosis not present

## 2018-11-19 DIAGNOSIS — H6123 Impacted cerumen, bilateral: Secondary | ICD-10-CM | POA: Diagnosis not present

## 2018-11-19 NOTE — ED Provider Notes (Addendum)
[facility]   [id number] 11/19/18 Arrival Time: 1603  CC: EAR fullness  SUBJECTIVE: History from: patient.  Beverly [name] is a 85 y.o. female who presents with of bilateral ear fullness x last week.  Denies a precipitating event.  Patient has tried OTC ear drops without relief.  Denies aggravating factors.  Denies previous symptoms in the past.    Denies fever, chills, fatigue, sinus pain, rhinorrhea, ear discharge, sore throat, SOB, wheezing, chest pain, nausea, changes in bowel or bladder habits.    ROS: As per HPI.  All other pertinent ROS negative.     Past Medical History:  Diagnosis Date   Anxiety    Arthritis    COPD (chronic obstructive pulmonary disease) (HCC)    Coronary artery disease    a. STEMI with cardiac arrest (polymorphic VT) s/p DES to RCA.   GERD (gastroesophageal reflux disease)    Headache(784.0)    Hearing loss, central    Left ear   Hematoma    Hypertension    Memory changes    Mild aortic stenosis    Mild carotid artery disease (HCC)    a. 1-39% by duplex 02/2018.   Seizures (HCC) 10/2012   No seizures, speech issues; after a fall, hitting head on left side   Past Surgical History:  Procedure Laterality Date   ABDOMINAL HYSTERECTOMY     APPENDECTOMY     CARDIAC CATHETERIZATION     15 yrs ago   CORONARY/GRAFT ACUTE MI REVASCULARIZATION N/A 01/10/2018   Procedure: Coronary/Graft Acute MI Revascularization;  Surgeon: Runell Gess, MD;  Location: [facility] INVASIVE CV LAB;  Service: Cardiovascular;  Laterality: N/A;   CRANIOTOMY Left 10/30/2012   Procedure: CRANIOTOMY HEMATOMA EVACUATION SUBDURAL;  Surgeon: Maeola Harman, MD;  Location: [facility] NEURO ORS;  Service: Neurosurgery;  Laterality: Left;  Left Craniotomy for evacuation of subdural hematoma   JOINT REPLACEMENT Bilateral    knees   LEFT HEART CATH AND CORONARY ANGIOGRAPHY N/A 01/10/2018   Procedure: LEFT HEART CATH AND CORONARY ANGIOGRAPHY;  Surgeon: Runell Gess, MD;   Location: [facility] INVASIVE CV LAB;  Service: Cardiovascular;  Laterality: N/A;   Allergies  Allergen Reactions   Eggs Or Egg-Derived Products Itching    Patient reports that she can eat eggs but does not tolerate egg derived products   Morphine And Related     Headache   Penicillins Itching    DID THE REACTION INVOLVE: Swelling of the face/tongue/throat, SOB, or low BP? No Sudden or severe rash/hives, skin peeling, or the inside of the mouth or nose? No Did it require medical treatment? No When did it last happen? Unknown If all above answers are NO, may proceed with cephalosporin use.    No current facility-administered medications on file prior to encounter.    Current Outpatient Medications on File Prior to Encounter  Medication Sig Dispense Refill   ALPRAZolam (XANAX) 0.5 MG tablet Take 0.5 mg by mouth 3 (three) times daily as needed for anxiety.     aspirin 81 MG chewable tablet Chew 1 tablet (81 mg total) by mouth daily.     atorvastatin (LIPITOR) 80 MG tablet Take 1 tablet (80 mg total) by mouth daily at 6 PM. 90 tablet 4   carvedilol (COREG) 6.25 MG tablet TAKE ONE TABLET (6.25MG  TOTAL) BY MOUTH TWO TIMES DAILY 60 tablet 2   celecoxib (CELEBREX) 200 MG capsule Take 200 mg by mouth daily.     cholecalciferol (VITAMIN D) 1000 UNITS tablet Take  1,000 Units by mouth daily.      fluticasone (FLONASE) 50 MCG/ACT nasal spray Place 1 spray into both nostrils daily.      HYDROcodone-acetaminophen (NORCO) 10-325 MG tablet Take 1 tablet by mouth every 6 (six) hours as needed.     latanoprost (XALATAN) 0.005 % ophthalmic solution      levETIRAcetam (KEPPRA) 500 MG tablet Take 500 mg by mouth 2 (two) times daily.     lisinopril (PRINIVIL,ZESTRIL) 20 MG tablet Take 1 tablet (20 mg total) by mouth daily. 90 tablet 3   Magnesium Oxide 400 MG CAPS Take 1 capsule (400 mg total) by mouth daily. 90 capsule 3   methocarbamol (ROBAXIN) 750 MG tablet Take 500 mg by mouth 2 (two) times  daily.      Multiple Vitamins-Minerals (PRESERVISION/LUTEIN PO) Take 1 tablet by mouth 2 (two) times daily.     nitroGLYCERIN (NITROSTAT) 0.4 MG SL tablet Place 1 tablet (0.4 mg total) under the tongue every 5 (five) minutes x 3 doses as needed for chest pain. 25 tablet 0   pantoprazole (PROTONIX) 40 MG tablet Take 40 mg by mouth daily.     potassium chloride (K-DUR) 10 MEQ tablet Take 10 mEq by mouth daily.      Propylene Glycol (SYSTANE BALANCE OP) Place 1-2 drops into both eyes daily as needed (dry eyes).     ticagrelor (BRILINTA) 90 MG TABS tablet Take 1 tablet (90 mg total) by mouth 2 (two) times daily. 180 tablet 4   Social History   Socioeconomic History   Marital status: Married    Spouse name: Not on file   Number of children: Not on file   Years of education: Not on file   Highest education level: Not on file  Occupational History   Not on file  Social Needs   Financial resource strain: Not on file   Food insecurity    Worry: Not on file    Inability: Not on file   Transportation needs    Medical: Not on file    Non-medical: Not on file  Tobacco Use   Smoking status: Never Smoker   Smokeless tobacco: Never Used  Substance and Sexual Activity   Alcohol use: No   Drug use: No   Sexual activity: Not on file  Lifestyle   Physical activity    Days per week: Not on file    Minutes per session: Not on file   Stress: Not on file  Relationships   Social connections    Talks on phone: Not on file    Gets together: Not on file    Attends religious service: Not on file    Active member of club or organization: Not on file    Attends meetings of clubs or organizations: Not on file    Relationship status: Not on file   Intimate partner violence    Fear of current or ex partner: Not on file    Emotionally abused: Not on file    Physically abused: Not on file    Forced sexual activity: Not on file  Other Topics Concern   Not on file  Social  History Narrative   Not on file   Family History  Problem Relation Age of Onset   Cancer Father    Ataxia Neg Hx    Chorea Neg Hx    Dementia Neg Hx    Mental retardation Neg Hx    Migraines Neg Hx    Multiple sclerosis Neg  Hx    Neurofibromatosis Neg Hx    Neuropathy Neg Hx    Parkinsonism Neg Hx    Seizures Neg Hx    Stroke Neg Hx     OBJECTIVE:  Vitals:   11/19/18 1618  BP: (!) 164/106  Pulse: 77  Resp: 20  Temp: 98.4 F (36.9 C)  SpO2: 94%     General appearance: alert; well-appearing HEENT: Ears: EACs with cerumen impaction; Eyes: PERRL, EOMI grossly; Nose: patent without rhinorrhea; Throat: oropharynx clear, tonsils not enlarged or erythematous, white tonsillar exudates, uvula midline Neck: supple without LAD Lungs: unlabored respirations, symmetrical air entry; cough: absent; no respiratory distress Heart: Murmur present.  Skin: warm and dry Psychological: alert and cooperative; normal mood and affect  PROCEDURE:  Consent granted.  Bilateral ear lavage performed by Hale BogusMara RT.  TM visualized.  PT tolerated procedure well.     ASSESSMENT & PLAN:  1. Bilateral impacted cerumen   2. Ear fullness, bilateral     No orders of the defined types were placed in this encounter.  Rest and drink plenty of fluids Ear lavage performed.  Patient tolerated well Continue OTC ear drops as needed Follow up with PCP if symptoms persists Return here or go to the ER if you have any new or worsening symptoms fever, chills, nausea, vomiting, ear pain, discharge from ear, etc...  Reviewed expectations re: course of current medical issues. Questions answered. Outlined signs and symptoms indicating need for more acute intervention. Patient verbalized understanding. After Visit Summary given.       Rennis HardingWurst, Mayumi Summerson, PA-C 11/19/18 1723

## 2018-11-19 NOTE — ED Triage Notes (Signed)
Pt presents with c/o ear fullness. Pt went for hearing test last week and they were unable to examine ear

## 2018-11-19 NOTE — Discharge Instructions (Signed)
Rest and drink plenty of fluids Ear lavage performed.  Patient tolerated well Continue OTC ear drops as needed Follow up with PCP if symptoms persists Return here or go to the ER if you have any new or worsening symptoms fever, chills, nausea, vomiting, ear pain, discharge from ear, etc..Marland Kitchen

## 2019-01-07 DIAGNOSIS — R05 Cough: Secondary | ICD-10-CM | POA: Diagnosis not present

## 2019-01-07 DIAGNOSIS — Z03818 Encounter for observation for suspected exposure to other biological agents ruled out: Secondary | ICD-10-CM | POA: Diagnosis not present

## 2019-01-09 ENCOUNTER — Other Ambulatory Visit: Payer: Self-pay

## 2019-01-09 ENCOUNTER — Emergency Department (HOSPITAL_COMMUNITY)
Admission: EM | Admit: 2019-01-09 | Discharge: 2019-01-10 | Disposition: A | Discharge status: Home / Self Care | Payer: Medicare Other | Attending: Emergency Medicine | Admitting: Emergency Medicine

## 2019-01-09 DIAGNOSIS — R531 Weakness: Secondary | ICD-10-CM | POA: Diagnosis present

## 2019-01-09 DIAGNOSIS — J189 Pneumonia, unspecified organism: Secondary | ICD-10-CM | POA: Insufficient documentation

## 2019-01-09 DIAGNOSIS — I2510 Atherosclerotic heart disease of native coronary artery without angina pectoris: Secondary | ICD-10-CM | POA: Diagnosis not present

## 2019-01-09 DIAGNOSIS — J449 Chronic obstructive pulmonary disease, unspecified: Secondary | ICD-10-CM | POA: Diagnosis not present

## 2019-01-09 DIAGNOSIS — R05 Cough: Secondary | ICD-10-CM | POA: Diagnosis not present

## 2019-01-09 DIAGNOSIS — Z7982 Long term (current) use of aspirin: Secondary | ICD-10-CM | POA: Diagnosis not present

## 2019-01-09 DIAGNOSIS — R000 Tachycardia, unspecified: Secondary | ICD-10-CM | POA: Diagnosis not present

## 2019-01-09 DIAGNOSIS — Z79899 Other long term (current) drug therapy: Secondary | ICD-10-CM | POA: Diagnosis not present

## 2019-01-09 DIAGNOSIS — I10 Essential (primary) hypertension: Secondary | ICD-10-CM | POA: Diagnosis not present

## 2019-01-09 DIAGNOSIS — I252 Old myocardial infarction: Secondary | ICD-10-CM | POA: Insufficient documentation

## 2019-01-09 LAB — URINALYSIS, ROUTINE W REFLEX MICROSCOPIC
Bilirubin Urine: NEGATIVE
Glucose, UA: NEGATIVE mg/dL
Hgb urine dipstick: NEGATIVE
Ketones, ur: 15 mg/dL — AB
Leukocytes,Ua: NEGATIVE
Nitrite: NEGATIVE
Protein, ur: NEGATIVE mg/dL
Specific Gravity, Urine: 1.015 (ref 1.005–1.030)
pH: 6.5 (ref 5.0–8.0)

## 2019-01-09 LAB — BASIC METABOLIC PANEL
Anion gap: 12 (ref 5–15)
BUN: 10 mg/dL (ref 8–23)
CO2: 25 mmol/L (ref 22–32)
Calcium: 9.4 mg/dL (ref 8.9–10.3)
Chloride: 98 mmol/L (ref 98–111)
Creatinine, Ser: 0.52 mg/dL (ref 0.44–1.00)
GFR calc Af Amer: >60 mL/min (ref >60)
GFR calc non Af Amer: >60 mL/min (ref >60)
Glucose, Bld: 123 mg/dL — ABNORMAL HIGH (ref 70–99)
Potassium: 3.8 mmol/L (ref 3.5–5.1)
Sodium: 135 mmol/L (ref 135–145)

## 2019-01-09 LAB — CBC
HCT: 36.7 % (ref 36.0–46.0)
Hemoglobin: 11.4 g/dL — ABNORMAL LOW (ref 12.0–15.0)
MCH: 31.1 pg (ref 26.0–34.0)
MCHC: 31.1 g/dL (ref 30.0–36.0)
MCV: 100.3 fL — ABNORMAL HIGH (ref 80.0–100.0)
Platelets: 342 10*3/uL (ref 150–400)
RBC: 3.66 MIL/uL — ABNORMAL LOW (ref 3.87–5.11)
RDW: 13.8 % (ref 11.5–15.5)
WBC: 14.1 10*3/uL — ABNORMAL HIGH (ref 4.0–10.5)
nRBC: 0 % (ref 0.0–0.2)

## 2019-01-09 MED ORDER — SODIUM CHLORIDE 0.9% FLUSH: 3.0000 mL | Freq: Once | INTRAVENOUS | Status: DC

## 2019-01-09 NOTE — ED Triage Notes (Signed)
Pt has UTI and has felt generally weak over the last week. Negative covid test. Went back to PCP today today bc daughter concerned she is still not feeling well. Blood pressure was high at the office. Per daughter, doctor's office told them that they needed to leave and they would either call them or they should come to the ER, so they came here. Pt denies pain, just feels weak.

## 2019-01-10 ENCOUNTER — Emergency Department (HOSPITAL_COMMUNITY): Payer: Medicare Other

## 2019-01-10 DIAGNOSIS — R05 Cough: Secondary | ICD-10-CM | POA: Diagnosis not present

## 2019-01-10 DIAGNOSIS — R531 Weakness: Secondary | ICD-10-CM | POA: Diagnosis not present

## 2019-01-10 LAB — TROPONIN I (HIGH SENSITIVITY): Troponin I (High Sensitivity): 12 ng/L (ref <18)

## 2019-01-10 LAB — CBG MONITORING, ED: Glucose-Capillary: 154 mg/dL — ABNORMAL HIGH (ref 70–99)

## 2019-01-10 MED ORDER — LEVOFLOXACIN 500 MG PO TABS: 500.0000 mg | ORAL_TABLET | Freq: Every day | ORAL | 0 refills | Status: DC

## 2019-01-10 MED ORDER — LEVOFLOXACIN IN D5W 750 MG/150ML IV SOLN
750.0000 mg | Freq: Once | INTRAVENOUS | Status: AC
  Administered 2019-01-10: 750 mg via INTRAVENOUS
  Filled 2019-01-10: qty 150

## 2019-01-10 MED ORDER — CARVEDILOL 12.5 MG PO TABS
6.2500 mg | ORAL_TABLET | Freq: Two times a day (BID) | ORAL | Status: DC
  Administered 2019-01-10: 6.25 mg via ORAL
  Filled 2019-01-10: qty 1

## 2019-01-10 MED ORDER — LISINOPRIL 20 MG PO TABS
20.0000 mg | ORAL_TABLET | Freq: Once | ORAL | Status: AC
  Administered 2019-01-10: 20 mg via ORAL
  Filled 2019-01-10: qty 1

## 2019-01-10 NOTE — Discharge Instructions (Signed)
Take levaquin daily for a week   Take your blood pressure meds as prescribed   See your doctor  Return to ER if you have worse weakness, trouble breathing, fever.

## 2019-01-10 NOTE — ED Provider Notes (Signed)
[facility] EMERGENCY DEPARTMENT Provider Note   CSN: [id number] Arrival date & time: 01/09/19  1657     History Chief Complaint  Patient presents with  . Weakness  . Hypertension    Beverly [name] is a 86 y.o. female hx of COPD, CAD s/p stent, hypertension here presenting with weakness, hypertension Patient was diagnosed with urinary tract infection and was prescribed a course of Macrobid .  She still has persistent weakness and tiredness but no urinary symptoms.  She went to see her doctor yesterday and apparently her blood pressure was in the 190s and she felt weak all over and so she was sent here for evaluation .Denies any falls or focal weakness or trouble speaking.  Patient does have nonproductive cough for the last several days. She is tested negative for Covid 2 days ago.  Patient did not take her blood pressure medicines this morning but she was sitting in the ED for the last 14 hours.  The history is provided by the patient.       Past Medical History:  Diagnosis Date  . Anxiety   . Arthritis   . COPD (chronic obstructive pulmonary disease) ([place])   . Coronary artery disease    a. STEMI with cardiac arrest (polymorphic VT) s/p DES to RCA.  Marland Kitchen GERD (gastroesophageal reflux disease)   . Headache(784.0)   . Hearing loss, central    Left ear  . Hematoma   . Hypertension   . Memory changes   . Mild aortic stenosis   . Mild carotid artery disease ([place])    a. 1-39% by duplex 02/2018.  Marland Kitchen Seizures ([place]) 10/2012   No seizures, speech issues; after a fall, hitting head on left side    Patient Active Problem List   Diagnosis Date Noted  . Hypokalemia 01/12/2018  . Hypomagnesemia 01/12/2018  . COPD (chronic obstructive pulmonary disease) ([place]) 01/12/2018  . Acute ST elevation myocardial infarction (STEMI) of inferior wall ([place]) 01/10/2018  . Cardiac arrest ([place])   . Dyslipidemia (high LDL; low HDL)   . 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 History of traumatic subdural hematoma 06/23/2014  . Subdural hematoma ([place]) 10/18/2012  . TIA (transient ischemic attack) 10/18/2012  . HTN (hypertension) 10/18/2012  . Hyponatremia 10/18/2012    Past Surgical History:  Procedure Laterality Date  . ABDOMINAL HYSTERECTOMY    . APPENDECTOMY    . CARDIAC CATHETERIZATION     15 yrs ago  . CORONARY/GRAFT ACUTE MI REVASCULARIZATION N/A 01/10/2018   Procedure: Coronary/Graft Acute MI Revascularization;  Surgeon: Lorretta Harp, MD;  Location: [facility];  Service: Cardiovascular;  Laterality: N/A;  . CRANIOTOMY Left 10/30/2012   Procedure: CRANIOTOMY HEMATOMA EVACUATION SUBDURAL;  Surgeon: Erline Levine, MD;  Location: [facility];  Service: Neurosurgery;  Laterality: Left;  Left Craniotomy for evacuation of subdural hematoma  . JOINT REPLACEMENT Bilateral    knees  . LEFT HEART CATH AND CORONARY ANGIOGRAPHY N/A 01/10/2018   Procedure: LEFT HEART CATH AND CORONARY ANGIOGRAPHY;  Surgeon: Lorretta Harp, MD;  Location: [facility];  Service: Cardiovascular;  Laterality: N/A;     OB History   No obstetric history on file.     Family History  Problem Relation Age of Onset  . Cancer Father   . Ataxia Neg Hx   . Chorea Neg Hx   . Dementia Neg Hx   .  Mental retardation Neg Hx   . Migraines Neg Hx   . Multiple sclerosis Neg Hx   . Neurofibromatosis Neg Hx   . Neuropathy Neg Hx   . Parkinsonism Neg Hx   . Seizures Neg Hx   . Stroke Neg Hx     Social History   Tobacco Use  . Smoking status: Never Smoker  . Smokeless tobacco: Never Used  Substance Use Topics  . Alcohol use: No  . Drug use: No    Home Medications Prior to Admission medications   Medication Sig Start Date End Date Taking? Authorizing Provider  ALPRAZolam Prudy Feeler) 0.5 MG tablet Take 0.5 mg by mouth 3 (three) times daily as needed for  anxiety.    [provider]  aspirin 81 MG chewable tablet Chew 1 tablet (81 mg total) by mouth daily. 01/13/18   Filbert Schilder, NP  atorvastatin (LIPITOR) 80 MG tablet Take 1 tablet (80 mg total) by mouth daily at 6 PM. 01/12/18   Georgie Chard D, NP  carvedilol (COREG) 6.25 MG tablet TAKE ONE TABLET (6.25MG  TOTAL) BY MOUTH TWO TIMES DAILY 04/03/18   Dunn, Tacey Ruiz, PA-C  celecoxib (CELEBREX) 200 MG capsule Take 200 mg by mouth daily.    [provider]  cholecalciferol (VITAMIN D) 1000 UNITS tablet Take 1,000 Units by mouth daily.     [provider]  fluticasone (FLONASE) 50 MCG/ACT nasal spray Place 1 spray into both nostrils daily.  09/18/12   [provider]  HYDROcodone-acetaminophen (NORCO) 10-325 MG tablet Take 1 tablet by mouth every 6 (six) hours as needed.    [provider]  latanoprost (XALATAN) 0.005 % ophthalmic solution  03/01/18   [provider]  levETIRAcetam (KEPPRA) 500 MG tablet Take 500 mg by mouth 2 (two) times daily.    [provider]  lisinopril (PRINIVIL,ZESTRIL) 20 MG tablet Take 1 tablet (20 mg total) by mouth daily. 02/23/18   Dunn, Tacey Ruiz, PA-C  Magnesium Oxide 400 MG CAPS Take 1 capsule (400 mg total) by mouth daily. 03/13/18 03/08/19  Dunn, Tacey Ruiz, PA-C  methocarbamol (ROBAXIN) 750 MG tablet Take 500 mg by mouth 2 (two) times daily.     [provider]  Multiple Vitamins-Minerals (PRESERVISION/LUTEIN PO) Take 1 tablet by mouth 2 (two) times daily.    [provider]  nitroGLYCERIN (NITROSTAT) 0.4 MG SL tablet Place 1 tablet (0.4 mg total) under the tongue every 5 (five) minutes x 3 doses as needed for chest pain. 01/12/18   Georgie Chard D, NP  pantoprazole (PROTONIX) 40 MG tablet Take 40 mg by mouth daily.    [provider]  potassium chloride (K-DUR) 10 MEQ tablet Take 10 mEq by mouth daily.     [provider]  Propylene Glycol (SYSTANE BALANCE OP) Place 1-2 drops  into both eyes daily as needed (dry eyes).    [provider]  ticagrelor (BRILINTA) 90 MG TABS tablet Take 1 tablet (90 mg total) by mouth 2 (two) times daily. 01/12/18   Georgie Chard D, NP    Allergies    Eggs or egg-derived products, Morphine and related, and Penicillins  Review of Systems   Review of Systems  Neurological: Positive for weakness.  All other systems reviewed and are negative.   Physical Exam Updated Vital Signs BP (!) 152/76   Pulse 95   Temp 98.9 F (37.2 C) (Oral)   Resp 17   SpO2 94%   Physical Exam Vitals and nursing  note reviewed.  Constitutional:      Comments: Chronically ill but no acutely ill   HENT:     Head: Normocephalic.     Nose: Nose normal.     Mouth/Throat:     Mouth: Mucous membranes are moist.  Eyes:     Extraocular Movements: Extraocular movements intact.     Pupils: Pupils are equal, round, and reactive to light.  Cardiovascular:     Rate and Rhythm: Normal rate and regular rhythm.     Pulses: Normal pulses.     Heart sounds: Normal heart sounds.  Pulmonary:     Effort: Pulmonary effort is normal.     Breath sounds: Normal breath sounds.  Abdominal:     General: Abdomen is flat.     Palpations: Abdomen is soft.  Musculoskeletal:        General: Normal range of motion.     Cervical back: Normal range of motion and neck supple.  Skin:    General: Skin is warm.     Capillary Refill: Capillary refill takes less than 2 seconds.  Neurological:     General: No focal deficit present.     Mental Status: She is alert and oriented to person, place, and time.     Comments: CN 2- 12 intact, nl strength throughout   Psychiatric:        Mood and Affect: Mood normal.     ED Results / Procedures / Treatments   Labs (all labs ordered are listed, but only abnormal results are displayed) Labs Reviewed  BASIC METABOLIC PANEL - Abnormal; Notable for the following components:      Result Value   Glucose, Bld 123 (*)    All  other components within normal limits  CBC - Abnormal; Notable for the following components:   WBC 14.1 (*)    RBC 3.66 (*)    Hemoglobin 11.4 (*)    MCV 100.3 (*)    All other components within normal limits  URINALYSIS, ROUTINE W REFLEX MICROSCOPIC - Abnormal; Notable for the following components:   Ketones, ur 15 (*)    All other components within normal limits  CBG MONITORING, ED - Abnormal; Notable for the following components:   Glucose-Capillary 154 (*)    All other components within normal limits  TROPONIN I (HIGH SENSITIVITY)    EKG EKG Interpretation  Date/Time:  Wednesday January 09 2019 18:13:46 EST Ventricular Rate:  101 PR Interval:  146 QRS Duration: 80 QT Interval:  340 QTC Calculation: 440 R Axis:   74 Text Interpretation: Sinus tachycardia Nonspecific ST abnormality Abnormal ECG No significant change since last tracing Confirmed by Richardean Canal [phone]) on 01/10/2019 7:06:53 AM   Radiology CT Head Wo Contrast  Result Date: 01/10/2019 CLINICAL DATA:  Weakness EXAM: CT HEAD WITHOUT CONTRAST TECHNIQUE: Contiguous axial images were obtained from the base of the skull through the vertex without intravenous contrast. COMPARISON:  2017 FINDINGS: Brain: There is no acute intracranial hemorrhage, mass-effect, or edema. Gray-white differentiation is preserved. There is no extra-axial fluid collection. Ventricles and sulci are within normal limits in size and configuration. Patchy hypoattenuation in the supratentorial white matter is nonspecific but may reflect mild chronic microvascular ischemic changes. Vascular: There is atherosclerotic calcification at the skull base. Skull: Prior left craniotomy.  Otherwise unremarkable. Sinuses/Orbits: No acute finding. Other: None. IMPRESSION: No acute intracranial abnormality. Electronically Signed   By: Guadlupe Spanish M.D.   On: 01/10/2019 08:03   DG Chest Port 1  View  Result Date: 01/10/2019 CLINICAL DATA:  Cough. EXAM: PORTABLE CHEST  1 VIEW COMPARISON:  01/11/2018.  10/29/2012. FINDINGS: Mediastinum and hilar structures normal. Heart size normal. Mild bibasilar atelectasis/infiltrates. No pleural effusion or pneumothorax. Tiny left pleural effusion. Sliding hiatal hernia. Degenerative changes scoliosis thoracic spine. Degenerative changes both shoulders. IMPRESSION: 1. Mild bibasilar atelectasis/infiltrates. Findings suggest bibasilar pneumonia. Small left pleural effusion. 2.  Sliding hiatal hernia. Electronically Signed   By: Maisie Fus  Register   On: 01/10/2019 07:45    Procedures Procedures (including critical care time)  Medications Ordered in ED Medications  sodium chloride flush (NS) 0.9 % injection 3 mL (has no administration in time range)  carvedilol (COREG) tablet 6.25 mg (6.25 mg Oral Given 01/10/19 0812)  levofloxacin (LEVAQUIN) IVPB 750 mg (750 mg Intravenous New Bag/Given 01/10/19 0932)  lisinopril (ZESTRIL) tablet 20 mg (20 mg Oral Given 01/10/19 8099)    ED Course  I have reviewed the triage vital signs and the nursing notes.  Pertinent labs & imaging results that were available during my care of the patient were reviewed by me and considered in my medical decision making (see chart for details).    MDM Rules/Calculators/A&P                      Beverly [name] is a 86 y.o. female here with weakness, hypertension.  Patient has a history of hypertension but did not take her meds this morning .  Also diffusely weak and had a cough  Patient tested negative for Covid several days ago and no family members were sick with Covid.  She recently had UTI so we will repeat urinalysis .  We will repeat basic labs and chest x-ray and CT head and give her BP meds.    10:12 AM Labs unremarkable. UA nl. CXR showed bibasilar pneumonia. COVID negative several days ago. WBC nl. Not septic. Given levaquin. Will dc home with levaquin for pneumonia.   Final Clinical Impression(s) / ED Diagnoses Final diagnoses:  None    Rx / DC  Orders ED Discharge Orders    None       Charlynne Pander, MD 01/10/19 1014

## 2019-01-10 NOTE — ED Notes (Signed)
IV antibiotics still infusing will d/c when done

## 2019-01-10 NOTE — ED Notes (Signed)
Pt being transported to CT

## 2019-01-10 NOTE — ED Notes (Signed)
Pts support person asked for an update as to position in the waiting room. Update given, pts family confirms that she will stay a little longer to be seen.

## 2019-01-16 DIAGNOSIS — Z96653 Presence of artificial knee joint, bilateral: Secondary | ICD-10-CM | POA: Diagnosis not present

## 2019-01-16 DIAGNOSIS — S8001XA Contusion of right knee, initial encounter: Secondary | ICD-10-CM | POA: Diagnosis not present

## 2019-01-16 DIAGNOSIS — Z471 Aftercare following joint replacement surgery: Secondary | ICD-10-CM | POA: Diagnosis not present

## 2019-01-17 ENCOUNTER — Emergency Department (HOSPITAL_COMMUNITY)
Admission: EM | Admit: 2019-01-17 | Discharge: 2019-01-17 | Disposition: A | Discharge status: Home / Self Care | Payer: Medicare Other | Attending: Emergency Medicine | Admitting: Emergency Medicine

## 2019-01-17 ENCOUNTER — Encounter (HOSPITAL_COMMUNITY): Payer: Self-pay | Admitting: *Deleted

## 2019-01-17 ENCOUNTER — Other Ambulatory Visit: Payer: Self-pay

## 2019-01-17 DIAGNOSIS — Z96653 Presence of artificial knee joint, bilateral: Secondary | ICD-10-CM | POA: Diagnosis not present

## 2019-01-17 DIAGNOSIS — Z7982 Long term (current) use of aspirin: Secondary | ICD-10-CM | POA: Insufficient documentation

## 2019-01-17 DIAGNOSIS — J449 Chronic obstructive pulmonary disease, unspecified: Secondary | ICD-10-CM | POA: Diagnosis not present

## 2019-01-17 DIAGNOSIS — R040 Epistaxis: Secondary | ICD-10-CM | POA: Insufficient documentation

## 2019-01-17 DIAGNOSIS — I2510 Atherosclerotic heart disease of native coronary artery without angina pectoris: Secondary | ICD-10-CM | POA: Diagnosis not present

## 2019-01-17 DIAGNOSIS — Z743 Need for continuous supervision: Secondary | ICD-10-CM | POA: Diagnosis not present

## 2019-01-17 DIAGNOSIS — I252 Old myocardial infarction: Secondary | ICD-10-CM | POA: Insufficient documentation

## 2019-01-17 DIAGNOSIS — Z8673 Personal history of transient ischemic attack (TIA), and cerebral infarction without residual deficits: Secondary | ICD-10-CM | POA: Insufficient documentation

## 2019-01-17 DIAGNOSIS — I10 Essential (primary) hypertension: Secondary | ICD-10-CM | POA: Diagnosis not present

## 2019-01-17 DIAGNOSIS — Z79899 Other long term (current) drug therapy: Secondary | ICD-10-CM | POA: Diagnosis not present

## 2019-01-17 MED ORDER — OXYMETAZOLINE HCL 0.05 % NA SOLN
1.0000 | Freq: Once | NASAL | Status: AC
  Administered 2019-01-17: 21:00:00 1 via NASAL
  Filled 2019-01-17: qty 30

## 2019-01-17 MED ORDER — CARVEDILOL 12.5 MG PO TABS
6.2500 mg | ORAL_TABLET | ORAL | Status: AC
  Administered 2019-01-17: 6.25 mg via ORAL
  Filled 2019-01-17: qty 1

## 2019-01-17 MED ORDER — DOUBLE ANTIBIOTIC 500-10000 UNIT/GM EX OINT
TOPICAL_OINTMENT | Freq: Once | CUTANEOUS | Status: AC
  Administered 2019-01-17: 1 via TOPICAL
  Filled 2019-01-17: qty 1

## 2019-01-17 MED ORDER — LISINOPRIL 10 MG PO TABS
20.0000 mg | ORAL_TABLET | Freq: Every day | ORAL | Status: DC
  Administered 2019-01-17: 20 mg via ORAL
  Filled 2019-01-17: qty 2

## 2019-01-17 NOTE — ED Notes (Signed)
ED Provider at bedside. 

## 2019-01-17 NOTE — ED Notes (Signed)
Pt assisted to the restroom

## 2019-01-17 NOTE — ED Triage Notes (Signed)
Pt with elevated BP at home.  Nose bleed since 1600 this afternoon, had stopped and has started to ooze again.

## 2019-01-17 NOTE — Discharge Instructions (Signed)
Please take your usual blood pressure medications first thing in the morning, I would also encourage you to follow-up with the ear nose and throat doctor who is on-call, Dr. Suszanne Conners, I have given you his phone number above.  Please call the office in the morning to make an appointment for Monday.  If you should develop severe or worsening pain in your nose, fever, return to the emergency department immediately however he will have an ongoing discomfort secondary to the balloon that is in your nose that is stopping the bleeding.  If the bleeding becomes heavy, return to the emergency department as well.  You may have a little bit of trickling over the next couple of days and that is okay

## 2019-01-17 NOTE — ED Notes (Signed)
Nosebleed cart at bedside.

## 2019-01-17 NOTE — ED Provider Notes (Signed)
[facility] EMERGENCY DEPARTMENT Provider Note   CSN: [id number] Arrival date & time: 01/17/19  1833     History Chief Complaint  Patient presents with  . Hypertension  . Epistaxis    Beverly [name] is a 86 y.o. female.  HPI   This patient is a very pleasant 86 year old female who presents from home with a complaint of a nosebleed.  She reports that she does have COPD, she has a history of myocardial infarction, she had stenting to her right coronary artery.  She takes medications including Xanax, baby aspirin, Lipitor, Coreg, lisinopril, nitroglycerin as needed, Brilinta.  She denies any nasal trauma but states that yesterday when she was walking she tripped over her dog's bed and struck the right side of her face against the wall.  She did not hit her nose.  She reports that since that time she had some pain in her shoulder and her knees but has been walking without significant problems related to her arms or legs.  The nosebleed has been rather persistent today with a little trickle here or there and a taste of blood in the back of her throat but is not pouring heavily.  She has no problems with prior nosebleeds in the past.  Past Medical History:  Diagnosis Date  . Anxiety   . Arthritis   . COPD (chronic obstructive pulmonary disease) ([facility])   . Coronary artery disease    a. STEMI with cardiac arrest (polymorphic VT) s/p DES to RCA.  Marland Kitchen GERD (gastroesophageal reflux disease)   . Headache(784.0)   . Hearing loss, central    Left ear  . Hematoma   . Hypertension   . Memory changes   . Mild aortic stenosis   . Mild carotid artery disease ([facility])    a. 1-39% by duplex 02/2018.  Marland Kitchen Seizures ([facility]) 10/2012   No seizures, speech issues; after a fall, hitting head on left side    Patient Active Problem List   Diagnosis Date Noted  . Hypokalemia 01/12/2018  . Hypomagnesemia 01/12/2018  . COPD (chronic obstructive pulmonary disease) ([facility]) 01/12/2018  . Acute ST elevation myocardial  infarction (STEMI) of inferior wall ([facility]) 01/10/2018  . Cardiac arrest ([facility])   . Dyslipidemia (high LDL; low HDL)   . 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 History of traumatic subdural hematoma 06/23/2014  . Subdural hematoma ([facility]) 10/18/2012  . TIA (transient ischemic attack) 10/18/2012  . HTN (hypertension) 10/18/2012  . Hyponatremia 10/18/2012    Past Surgical History:  Procedure Laterality Date  . ABDOMINAL HYSTERECTOMY    . APPENDECTOMY    . CARDIAC CATHETERIZATION     15 yrs ago  . CORONARY/GRAFT ACUTE MI REVASCULARIZATION N/A 01/10/2018   Procedure: Coronary/Graft Acute MI Revascularization;  Surgeon: Runell Gess, MD;  Location: [facility] INVASIVE [facility];  Service: Cardiovascular;  Laterality: N/A;  . CRANIOTOMY Left 10/30/2012   Procedure: CRANIOTOMY HEMATOMA EVACUATION SUBDURAL;  Surgeon: Maeola Harman, MD;  Location: [facility];  Service: Neurosurgery;  Laterality: Left;  Left Craniotomy for evacuation of subdural hematoma  . JOINT REPLACEMENT Bilateral    knees  . LEFT HEART CATH AND CORONARY ANGIOGRAPHY N/A 01/10/2018   Procedure: LEFT HEART CATH AND CORONARY ANGIOGRAPHY;  Surgeon: Runell Gess, MD;  Location: [facility] INVASIVE [facility];  Service: Cardiovascular;  Laterality: N/A;     OB History   No obstetric history on file.  Family History  Problem Relation Age of Onset  . Cancer Father   . Ataxia Neg Hx   . Chorea Neg Hx   . Dementia Neg Hx   . Mental retardation Neg Hx   . Migraines Neg Hx   . Multiple sclerosis Neg Hx   . Neurofibromatosis Neg Hx   . Neuropathy Neg Hx   . Parkinsonism Neg Hx   . Seizures Neg Hx   . Stroke Neg Hx     Social History   Tobacco Use  . Smoking status: Never Smoker  . Smokeless tobacco: Never Used  Substance Use Topics  . Alcohol use: No  . Drug use: No    Home Medications Prior to Admission  medications   Medication Sig Start Date End Date Taking? Authorizing Provider  ALPRAZolam Duanne Moron) 0.5 MG tablet Take 0.5 mg by mouth 3 (three) times daily as needed for anxiety.    [provider]  aspirin 81 MG chewable tablet Chew 1 tablet (81 mg total) by mouth daily. 01/13/18   Tommie [place], NP  atorvastatin (LIPITOR) 80 MG tablet Take 1 tablet (80 mg total) by mouth daily at 6 PM. 01/12/18   Kathyrn Drown D, NP  carvedilol (COREG) 6.25 MG tablet TAKE ONE TABLET (6.25MG  TOTAL) BY MOUTH TWO TIMES DAILY 04/03/18   Dunn, Nedra Hai, PA-C  celecoxib (CELEBREX) 200 MG capsule Take 200 mg by mouth daily.    [provider]  cholecalciferol (VITAMIN D) 1000 UNITS tablet Take 1,000 Units by mouth daily.     [provider]  fluticasone (FLONASE) 50 MCG/ACT nasal spray Place 1 spray into both nostrils daily.  09/18/12   [provider]  HYDROcodone-acetaminophen (NORCO) 10-325 MG tablet Take 1 tablet by mouth every 6 (six) hours as needed.    [provider]  latanoprost (XALATAN) 0.005 % ophthalmic solution  03/01/18   [provider]  levETIRAcetam (KEPPRA) 500 MG tablet Take 500 mg by mouth 2 (two) times daily.    [provider]  levofloxacin (LEVAQUIN) 500 MG tablet Take 1 tablet (500 mg total) by mouth daily. 01/10/19   Drenda Freeze, MD  lisinopril (PRINIVIL,ZESTRIL) 20 MG tablet Take 1 tablet (20 mg total) by mouth daily. 02/23/18   Dunn, Nedra Hai, PA-C  Magnesium Oxide 400 MG CAPS Take 1 capsule (400 mg total) by mouth daily. 03/13/18 03/08/19  Dunn, Nedra Hai, PA-C  methocarbamol (ROBAXIN) 750 MG tablet Take 500 mg by mouth 2 (two) times daily.     [provider]  Multiple Vitamins-Minerals (PRESERVISION/LUTEIN PO) Take 1 tablet by mouth 2 (two) times daily.    [provider]  nitroGLYCERIN (NITROSTAT) 0.4 MG SL tablet Place 1 tablet (0.4 mg total) under the tongue every 5 (five) minutes x 3 doses as needed for chest  pain. 01/12/18   Kathyrn Drown D, NP  pantoprazole (PROTONIX) 40 MG tablet Take 40 mg by mouth daily.    [provider]  potassium chloride (K-DUR) 10 MEQ tablet Take 10 mEq by mouth daily.     [provider]  Propylene Glycol (SYSTANE BALANCE OP) Place 1-2 drops into both eyes daily as needed (dry eyes).    [provider]  ticagrelor (BRILINTA) 90 MG TABS tablet Take 1 tablet (90 mg total) by mouth 2 (two) times daily. 01/12/18   Kathyrn Drown D, NP    Allergies    Eggs or egg-derived products, Morphine and related, and Penicillins  Review of Systems  Review of Systems  All other systems reviewed and are negative.   Physical Exam Updated Vital Signs BP (!) 180/79 (BP Location: Left Arm)   Pulse 87   Temp (!) 97.2 F (36.2 C) (Temporal)   Resp 17   Ht 1.524 m (5')   Wt 52.6 kg   SpO2 97%   BMI 22.65 kg/m   Physical Exam Vitals and nursing note reviewed.  Constitutional:      General: She is not in acute distress.    Appearance: She is well-developed.  HENT:     Head: Normocephalic and atraumatic.     Nose:     Comments: Normal-appearing turbinates, small amount of dark red blood, no active bleeding.  Right nostril with blood, left nostril patent and clear    Mouth/Throat:     Mouth: Mucous membranes are moist.     Pharynx: No oropharyngeal exudate.     Comments: Small amount of blood clot in the back of the posterior pharynx, no blood in the mouth Eyes:     General: No scleral icterus.       Right eye: No discharge.        Left eye: No discharge.     Conjunctiva/sclera: Conjunctivae normal.     Pupils: Pupils are equal, round, and reactive to light.  Neck:     Thyroid: No thyromegaly.     Vascular: No JVD.  Cardiovascular:     Rate and Rhythm: Normal rate and regular rhythm.     Heart sounds: Normal heart sounds. No murmur. No friction rub. No gallop.   Pulmonary:     Effort: Pulmonary effort is normal. No respiratory distress.      Breath sounds: Normal breath sounds. No wheezing or rales.  Abdominal:     General: Bowel sounds are normal. There is no distension.     Palpations: Abdomen is soft. There is no mass.     Tenderness: There is no abdominal tenderness.  Musculoskeletal:        General: No tenderness. Normal range of motion.     Cervical back: Normal range of motion and neck supple.  Lymphadenopathy:     Cervical: No cervical adenopathy.  Skin:    General: Skin is warm and dry.     Findings: No erythema or rash.  Neurological:     Mental Status: She is alert.     Coordination: Coordination normal.  Psychiatric:        Behavior: Behavior normal.     ED Results / Procedures / Treatments   Labs (all labs ordered are listed, but only abnormal results are displayed) Labs Reviewed - No data to display  EKG None  Radiology No results found.  Procedures .Epistaxis Management  Date/Time: 01/17/2019 9:32 PM Performed by: Eber Hong, MD Authorized by: Eber Hong, MD   Consent:    Consent obtained:  Verbal   Consent given by:  Patient   Risks discussed:  Bleeding, infection, nasal injury and pain   Alternatives discussed:  No treatment and alternative treatment Anesthesia (see MAR for exact dosages):    Anesthesia method:  None Procedure details:    Treatment site:  R anterior   Treatment method:  Anterior pack   Treatment complexity:  Limited   Treatment episode: initial   Post-procedure details:    Assessment:  Bleeding decreased   Patient tolerance of procedure:  Tolerated well, no immediate complications Comments:         (including critical care time)  Medications Ordered in ED Medications  lisinopril (ZESTRIL) tablet 20 mg (20 mg Oral Given 01/17/19 2028)  polymixin-bacitracin (POLYSPORIN) ointment (1 application Topical Given 01/17/19 2029)  carvedilol (COREG) tablet 6.25 mg (6.25 mg Oral Given 01/17/19 2028)  oxymetazoline (AFRIN) 0.05 % nasal spray 1 spray (1 spray Each  Nare Given 01/17/19 2106)  polymixin-bacitracin (POLYSPORIN) ointment (1 application Topical Given 01/17/19 2125)    ED Course  I have reviewed the triage vital signs and the nursing notes.  Pertinent labs & imaging results that were available during my care of the patient were reviewed by me and considered in my medical decision making (see chart for details).    MDM Rules/Calculators/A&P                      Certainly the patient is in no distress, her blood pressure is elevated at 188/100 however I do not think this is necessarily contributing to the patient's nosebleed.  She is anticoagulated with an antiplatelet agent and a baby aspirin.  She will likely need to have an inflatable pack as this does not appear to be a true anterior nosebleed.  It also does not appear to be a true posterior and she does not have heavy bleeding.  She is agreeable to the plan  She will also get a dose of her home blood pressure medicines.  She reports that she has already seen the orthopedist with regards to her other minor injuries and on my exam she has supple joints without the need for imaging.  Bleeding stopped complettely - on Levaquin at this time, no need for prophylactic abx otherwise, Pt agreeable to return as needed  Final Clinical Impression(s) / ED Diagnoses Final diagnoses:  Epistaxis  Essential hypertension    Rx / DC Orders ED Discharge Orders    None       Eber Hong, MD 01/17/19 2248

## 2019-01-18 DIAGNOSIS — Z9189 Other specified personal risk factors, not elsewhere classified: Secondary | ICD-10-CM | POA: Diagnosis not present

## 2019-01-18 DIAGNOSIS — G894 Chronic pain syndrome: Secondary | ICD-10-CM | POA: Diagnosis not present

## 2019-01-21 DIAGNOSIS — R040 Epistaxis: Secondary | ICD-10-CM | POA: Diagnosis not present

## 2019-01-24 ENCOUNTER — Encounter: Payer: Self-pay | Admitting: Student

## 2019-01-24 ENCOUNTER — Ambulatory Visit: Payer: Medicare Other | Admitting: Student

## 2019-01-24 VITALS — BP 154/80 | HR 84 | Temp 97.5°F | Ht 60.0 in | Wt 114.6 lb

## 2019-01-24 DIAGNOSIS — I350 Nonrheumatic aortic (valve) stenosis: Secondary | ICD-10-CM

## 2019-01-24 DIAGNOSIS — I2510 Atherosclerotic heart disease of native coronary artery without angina pectoris: Secondary | ICD-10-CM | POA: Diagnosis not present

## 2019-01-24 DIAGNOSIS — E785 Hyperlipidemia, unspecified: Secondary | ICD-10-CM | POA: Diagnosis not present

## 2019-01-24 DIAGNOSIS — I779 Disorder of arteries and arterioles, unspecified: Secondary | ICD-10-CM

## 2019-01-24 DIAGNOSIS — I10 Essential (primary) hypertension: Secondary | ICD-10-CM

## 2019-01-24 DIAGNOSIS — Z79899 Other long term (current) drug therapy: Secondary | ICD-10-CM

## 2019-01-24 MED ORDER — LISINOPRIL 20 MG PO TABS: 20.0000 mg | ORAL_TABLET | Freq: Two times a day (BID) | ORAL | 6 refills | Status: DC

## 2019-01-24 NOTE — Progress Notes (Addendum)
Cardiology Office Note    Date:  01/24/2019   ID:  SHAANA ACOCELLA, DOB 09-02-1932, MRN [id number]  PCP:  Patient, No Pcp Per  Cardiologist: Nona Dell, MD    Chief Complaint  Patient presents with  . Follow-up    elevated BP    History of Present Illness:    Beverly [name] is a 86 y.o. female with past medical history of CAD (s/p STEMI in 01/2018 with polymorphic VT arrest --> s/p DES to RCA), HTN, HLD, mild AS, anemia and mild carotid artery disease who presents to the office today for evaluation of elevated BP.   She most recently had a phone visit with Dr. Diona Browner in 04/2018 and she denied any recent chest pain or dyspnea on exertion at that time. BP was controlled at 135/75 and she was continued on her current medication regimen at that time including ASA 81 mg daily, Atorvastatin 80 mg daily, Coreg 6.25 mg twice daily, Lisinopril 20 mg daily and Brilinta 90 mg twice daily.  By review of notes, she was evaluated at [facility] ED on 01/10/2019 for evaluation of worsening weakness after having been diagnosed with a UTI by her PCP. She reported SBP had been elevated in the 190's when checked at her PCP's office and she was encouraged to go to the ED for further evaluation. WBC was elevated to 14.1 and CXR showed mild bibasilar atelectasis/infiltrates suggestive of pneumonia. BP did improve while in the ED and none of her medications were changed at that time. She was prescribed Levaquin for her PNA.  She presented back to [facility] ED on 01/17/2019 for evaluation of elevated BP and a nosebleed. BP was elevated to 180/79 while in the ED. She received her home BP medications and BP improved. She did undergo packing of her right nostril with ENT follow-up encouraged.  In talking with the patient and her daughter today, she reports overall feeling very weak since being diagnosed with PNA. She denies any recent fever, chills or cough. No chest pain, palpitations, orthopnea, PND or lower  extremity edema. She does have dyspnea on exertion but her activity has been limited over the past few weeks.  BP has been elevated when checked at home and they report SBP is typically in the 150's to 160's but was elevated to greater than 200 earlier this month. Her medications were not changed during her ED evaluations. She was previously followed by Dr. [name] but given his retirement she plans to establish with Dr. Margo Aye next week.  Past Medical History:  Diagnosis Date  . Anxiety   . Arthritis   . COPD (chronic obstructive pulmonary disease) (HCC)   . Coronary artery disease    a. STEMI with cardiac arrest (polymorphic VT) s/p DES to RCA.  Marland Kitchen GERD (gastroesophageal reflux disease)   . Headache(784.0)   . Hearing loss, central    Left ear  . Hematoma   . Hypertension   . Memory changes   . Mild aortic stenosis   . Mild carotid artery disease (HCC)    a. 1-39% by duplex 02/2018.  Marland Kitchen Seizures (HCC) 10/2012   No seizures, speech issues; after a fall, hitting head on left side    Past Surgical History:  Procedure Laterality Date  . ABDOMINAL HYSTERECTOMY    . APPENDECTOMY    . CARDIAC CATHETERIZATION     15 yrs ago  . CORONARY/GRAFT ACUTE MI REVASCULARIZATION N/A 01/10/2018   Procedure: Coronary/Graft Acute MI Revascularization;  Surgeon: Runell Gess, MD;  Location: [facility] INVASIVE CV LAB;  Service: Cardiovascular;  Laterality: N/A;  . CRANIOTOMY Left 10/30/2012   Procedure: CRANIOTOMY HEMATOMA EVACUATION SUBDURAL;  Surgeon: Maeola Harman, MD;  Location: [facility] NEURO ORS;  Service: Neurosurgery;  Laterality: Left;  Left Craniotomy for evacuation of subdural hematoma  . JOINT REPLACEMENT Bilateral    knees  . LEFT HEART CATH AND CORONARY ANGIOGRAPHY N/A 01/10/2018   Procedure: LEFT HEART CATH AND CORONARY ANGIOGRAPHY;  Surgeon: Runell Gess, MD;  Location: [facility] INVASIVE CV LAB;  Service: Cardiovascular;  Laterality: N/A;    Current Medications: Outpatient Medications Prior to Visit   Medication Sig Dispense Refill  . ALPRAZolam (XANAX) 0.5 MG tablet Take 0.5 mg by mouth 3 (three) times daily as needed for anxiety.    Marland Kitchen aspirin 81 MG chewable tablet Chew 1 tablet (81 mg total) by mouth daily.    Marland Kitchen atorvastatin (LIPITOR) 80 MG tablet Take 1 tablet (80 mg total) by mouth daily at 6 PM. 90 tablet 4  . carvedilol (COREG) 6.25 MG tablet TAKE ONE TABLET (6.25MG  TOTAL) BY MOUTH TWO TIMES DAILY 60 tablet 2  . celecoxib (CELEBREX) 200 MG capsule Take 200 mg by mouth daily.    . cholecalciferol (VITAMIN D) 1000 UNITS tablet Take 1,000 Units by mouth daily.     . fluticasone (FLONASE) 50 MCG/ACT nasal spray Place 1 spray into both nostrils daily.     Marland Kitchen HYDROcodone-acetaminophen (NORCO) 10-325 MG tablet Take 1 tablet by mouth every 6 (six) hours as needed.    . latanoprost (XALATAN) 0.005 % ophthalmic solution     . levETIRAcetam (KEPPRA) 500 MG tablet Take 500 mg by mouth 2 (two) times daily.    Marland Kitchen levofloxacin (LEVAQUIN) 500 MG tablet Take 1 tablet (500 mg total) by mouth daily. 7 tablet 0  . Magnesium Oxide 400 MG CAPS Take 1 capsule (400 mg total) by mouth daily. 90 capsule 3  . methocarbamol (ROBAXIN) 750 MG tablet Take 500 mg by mouth 2 (two) times daily.     . Multiple Vitamins-Minerals (PRESERVISION/LUTEIN PO) Take 1 tablet by mouth 2 (two) times daily.    . nitroGLYCERIN (NITROSTAT) 0.4 MG SL tablet Place 1 tablet (0.4 mg total) under the tongue every 5 (five) minutes x 3 doses as needed for chest pain. 25 tablet 0  . pantoprazole (PROTONIX) 40 MG tablet Take 40 mg by mouth daily.    . potassium chloride (K-DUR) 10 MEQ tablet Take 10 mEq by mouth daily.     Marland Kitchen Propylene Glycol (SYSTANE BALANCE OP) Place 1-2 drops into both eyes daily as needed (dry eyes).    . ticagrelor (BRILINTA) 90 MG TABS tablet Take 1 tablet (90 mg total) by mouth 2 (two) times daily. 180 tablet 4  . lisinopril (PRINIVIL,ZESTRIL) 20 MG tablet Take 1 tablet (20 mg total) by mouth daily. 90 tablet 3   No  facility-administered medications prior to visit.     Allergies:   Eggs or egg-derived products, Morphine and related, and Penicillins   Social History   Socioeconomic History  . Marital status: Married    Spouse name: Not on file  . Number of children: Not on file  . Years of education: Not on file  . Highest education level: Not on file  Occupational History  . Not on file  Tobacco Use  . Smoking status: Never Smoker  . Smokeless tobacco: Never Used  Substance and Sexual Activity  . Alcohol use: No  .  Drug use: No  . Sexual activity: Not on file  Other Topics Concern  . Not on file  Social History Narrative  . Not on file   Social Determinants of Health   Financial Resource Strain:   . Difficulty of Paying Living Expenses: Not on file  Food Insecurity:   . Worried About Programme researcher, broadcasting/film/video in the Last Year: Not on file  . Ran Out of Food in the Last Year: Not on file  Transportation Needs:   . Lack of Transportation (Medical): Not on file  . Lack of Transportation (Non-Medical): Not on file  Physical Activity:   . Days of Exercise per Week: Not on file  . Minutes of Exercise per Session: Not on file  Stress:   . Feeling of Stress : Not on file  Social Connections:   . Frequency of Communication with Friends and Family: Not on file  . Frequency of Social Gatherings with Friends and Family: Not on file  . Attends Religious Services: Not on file  . Active Member of Clubs or Organizations: Not on file  . Attends Banker Meetings: Not on file  . Marital Status: Not on file     Family History:  The patient's family history includes Cancer in her father.   Review of Systems:   Please see the history of present illness.     General:  No chills, fever, night sweats or weight changes. Positive for generalized weakness.  Cardiovascular:  No chest pain, dyspnea on exertion, edema, orthopnea, palpitations, paroxysmal nocturnal dyspnea. Dermatological: No  rash, lesions/masses Respiratory: No cough, dyspnea Urologic: No hematuria, dysuria Abdominal:   No nausea, vomiting, diarrhea, bright red blood per rectum, melena, or hematemesis Neurologic:  No visual changes, wkns, changes in mental status. All other systems reviewed and are otherwise negative except as noted above.   Physical Exam:    VS:  BP (!) 154/80   Pulse 84   Temp (!) 97.5 F (36.4 C) (Temporal)   Ht 5' (1.524 m)   Wt 114 lb 9.6 oz (52 kg)   SpO2 95%   BMI 22.38 kg/m    General: Well developed, well nourished,female appearing in no acute distress. Head: Normocephalic, atraumatic, sclera non-icteric, no xanthomas, nares are without discharge.  Neck: No carotid bruits. JVD not elevated.  Lungs: Respirations regular and unlabored, without wheezes or rales.  Heart: Regular rate and rhythm. No S3 or S4.  No rubs or gallops appreciated. 2/6 SEM along RUSB.  Abdomen: Soft, non-tender, non-distended with normoactive bowel sounds. No hepatomegaly. No rebound/guarding. No obvious abdominal masses. Msk:  Strength and tone appear normal for age. No joint deformities or effusions. Extremities: No clubbing or cyanosis. No lower extremity edema.  Distal pedal pulses are 2+ bilaterally. Neuro: Alert and oriented X 3. Moves all extremities spontaneously. No focal deficits noted. Psych:  Responds to questions appropriately with a normal affect. Skin: No rashes or lesions noted  Wt Readings from Last 3 Encounters:  01/24/19 114 lb 9.6 oz (52 kg)  01/17/19 116 lb (52.6 kg)  04/17/18 105 lb (47.6 kg)    Studies/Labs Reviewed:   EKG:  EKG is not ordered today. EKG from 01/09/2019 showed sinus tachycardia, HR 101, with no acute ST abnormalities when compared to prior tracings.   Recent Labs: 03/12/2018: TSH 0.924 03/23/2018: ALT 23; Magnesium 1.8 01/09/2019: BUN 10; Creatinine, Ser 0.52; Hemoglobin 11.4; Platelets 342; Potassium 3.8; Sodium 135   Lipid Panel    Component Value  Date/Time     CHOL 134 03/12/2018 1450   TRIG 41 03/12/2018 1450   HDL 97 03/12/2018 1450   CHOLHDL 1.4 03/12/2018 1450   CHOLHDL 1.8 01/10/2018 0428   VLDL 8 01/10/2018 0428   LDLCALC 29 03/12/2018 1450    Additional studies/ records that were reviewed today include:   Cardiac Catheterization: 01/2018  Mid RCA lesion is 99% stenosed.  A drug-eluting stent was successfully placed.  Post intervention, there is a 0% residual stenosis.   IMPRESSION: Successful PCI and drug-eluting stenting of a occluded dominant RCA with faint left-to-right collaterals.  Vessel opened with injection and was subtotally occluded with visible thrombus.  The door to balloon time was 27 minutes.  The patient remained hemodynamically stable throughout the case on IV epinephrine and amiodarone.  Angiomax will continue for 4 hours full dose.  The sheath was then removed be removed and pressure held.  Her enzymes will be cycled.  2D echo has been ordered.  She will be treated with routine post MI pharmacology including high-dose statin drug, beta-blocker depending on her blood pressure.  She can probably be "fast tracked" given her normal left system.  She left the lab in stable condition.  Echocardiogram: 01/2018 Study Conclusions  - Left ventricle: The cavity size was normal. Wall thickness was   increased in a pattern of mild LVH. Basal to mid inferior   akinesis. Basal inferoseptal akinesis. The estimated ejection   fraction was 55%. Doppler parameters are consistent with abnormal   left ventricular relaxation (grade 1 diastolic dysfunction). - Aortic valve: Trileaflet; moderately calcified leaflets. There   was mild stenosis. Mean gradient (S): 13 mm Hg. Valve area   (Vmean): 1.74 cm^2. - Mitral valve: There was trivial regurgitation. - Left atrium: The atrium was mildly dilated. - Right ventricle: The cavity size was normal. Systolic function   was normal. - Pulmonary arteries: No complete TR doppler jet so  unable to   estimate PA systolic pressure. - Inferior vena cava: The vessel was normal in size. The   respirophasic diameter changes were in the normal range (>= 50%),   consistent with normal central venous pressure.  Impressions:  - Normal LV size with mild LV hypertrophy. EF 55% with basal to mid   inferior akinesis and basal inferoseptal akinesis. Normal RV size   and systolic function. Mild aortic stenosis.  Assessment:    1. Coronary artery disease involving native coronary artery of native heart without angina pectoris   2. Essential hypertension   3. Medication management   4. Hyperlipidemia LDL goal <70   5. Mild aortic stenosis   6. Mild carotid artery disease ([place])      Plan:   In order of problems listed above:  1. CAD - s/p STEMI in 01/2018 with polymorphic VT arrest and she received a DES to the RCA. She denies any recent chest pain and breathing has improved since being treated for PNA.  - continue current regimen with ASA, Brilinta, BB and statin therapy. She has been on DAPT for over one year. Will reach out to Dr. Domenic Polite to see if he wishes for her to remain on DAPT and if so, to reduce Brilinta to 60mg  BID. She does have a chronic anemia but Hgb was stable at 11.4 on 01/09/2019.  ADDENDUM: Reviewed with Dr. Domenic Polite and can stop Brilinta at this time. Called the patient's daughter and communicated this. They will finish her current bottle then stop the medication. Removed from medication  list.    2. HTN - BP has been elevated during recent ED evaluations and when checked at home. At 154/80 during today's office visit. She is currently on Coreg 6.25mg  BID and Lisinopril 20mg  daily. Will titrate Lisinopril to 20mg  BID. Recheck BMET in 2 weeks. I encouraged them to keep a BP log at home and report back with readings.   3. HLD - followed by PCP. LDL at 29 when checked in 16/109603/2020. Continue Atorvastatin 80mg  daily.   4. Aortic Stenosis - mild by echocardiogram  in 01/2018. Would anticipate a repeat echo in the next 1-2 years unless clinically indicated sooner.    5. Carotid Artery Plaque - dopplers in 01/2018 showed 1-39% stenosis bilaterally. No bruit on examination. Continue ASA and statin therapy.     Medication Adjustments/Labs and Tests Ordered: Current medicines are reviewed at length with the patient today.  Concerns regarding medicines are outlined above.  Medication changes, Labs and Tests ordered today are listed in the Patient Instructions below. Patient Instructions  Medication Instructions:  Your physician has recommended you make the following change in your medication:  Increase Lisinopril to 20 mg Two Times Daily   *If you need a refill on your cardiac medications before your next appointment, please call your pharmacy*  Lab Work: Your physician recommends that you return for lab work in: 2 Weeks  If you have labs (blood work) drawn today and your tests are completely normal, you will receive your results only by: Marland Kitchen. MyChart Message (if you have MyChart) OR . A paper copy in the mail If you have any lab test that is abnormal or we need to change your treatment, we will call you to review the results.  Testing/Procedures: NONE   Follow-Up: At [facility], you and your health needs are our priority.  As part of our continuing mission to provide you with exceptional heart care, we have created designated Provider Care Teams.  These Care Teams include your primary Cardiologist (physician) and Advanced Practice Providers (APPs -  Physician Assistants and Nurse Practitioners) who all work together to provide you with the care you need, when you need it.  Your next appointment:   3-4 month(s)  The format for your next appointment:   In Person  Provider:   Nona DellSamuel McDowell, MD  Other Instructions Thank you for choosing [facility]!       Signed, Ellsworth LennoxBrittany M Kaity Pitstick, PA-C  01/24/2019 5:23 PM      [facility] [address] [place], [place] [address] Phone: [phone] Fax: [phone]

## 2019-01-24 NOTE — Patient Instructions (Signed)
Medication Instructions:  Your physician has recommended you make the following change in your medication:  Increase Lisinopril to 20 mg Two Times Daily   *If you need a refill on your cardiac medications before your next appointment, please call your pharmacy*  Lab Work: Your physician recommends that you return for lab work in: 2 Weeks  If you have labs (blood work) drawn today and your tests are completely normal, you will receive your results only by: Marland Kitchen MyChart Message (if you have MyChart) OR . A paper copy in the mail If you have any lab test that is abnormal or we need to change your treatment, we will call you to review the results.  Testing/Procedures: NONE   Follow-Up: At [facility], you and your health needs are our priority.  As part of our continuing mission to provide you with exceptional heart care, we have created designated Provider Care Teams.  These Care Teams include your primary Cardiologist (physician) and Advanced Practice Providers (APPs -  Physician Assistants and Nurse Practitioners) who all work together to provide you with the care you need, when you need it.  Your next appointment:   3-4 month(s)  The format for your next appointment:   In Person  Provider:   Nona Dell, MD  Other Instructions Thank you for choosing  [facility]!

## 2019-01-25 ENCOUNTER — Other Ambulatory Visit: Payer: Self-pay | Admitting: Student

## 2019-01-28 DIAGNOSIS — Z0189 Encounter for other specified special examinations: Secondary | ICD-10-CM | POA: Diagnosis not present

## 2019-01-28 DIAGNOSIS — I25119 Atherosclerotic heart disease of native coronary artery with unspecified angina pectoris: Secondary | ICD-10-CM | POA: Diagnosis not present

## 2019-01-28 DIAGNOSIS — I10 Essential (primary) hypertension: Secondary | ICD-10-CM | POA: Diagnosis not present

## 2019-01-28 DIAGNOSIS — K219 Gastro-esophageal reflux disease without esophagitis: Secondary | ICD-10-CM | POA: Diagnosis not present

## 2019-01-28 DIAGNOSIS — M0688 Other specified rheumatoid arthritis, vertebrae: Secondary | ICD-10-CM | POA: Diagnosis not present

## 2019-01-30 ENCOUNTER — Ambulatory Visit: Payer: Medicare Other | Admitting: Cardiology

## 2019-01-30 ENCOUNTER — Other Ambulatory Visit: Payer: Self-pay

## 2019-01-30 ENCOUNTER — Other Ambulatory Visit: Payer: Self-pay | Admitting: Internal Medicine

## 2019-01-30 ENCOUNTER — Other Ambulatory Visit (HOSPITAL_COMMUNITY): Payer: Self-pay | Admitting: Internal Medicine

## 2019-01-30 ENCOUNTER — Ambulatory Visit (HOSPITAL_COMMUNITY)
Admission: RE | Admit: 2019-01-30 | Discharge: 2019-01-30 | Disposition: A | Discharge status: Home / Self Care | Payer: Medicare Other | Source: Ambulatory Visit | Attending: Internal Medicine | Admitting: Internal Medicine

## 2019-01-30 DIAGNOSIS — I10 Essential (primary) hypertension: Secondary | ICD-10-CM | POA: Diagnosis not present

## 2019-01-30 DIAGNOSIS — G43909 Migraine, unspecified, not intractable, without status migrainosus: Secondary | ICD-10-CM | POA: Diagnosis not present

## 2019-01-30 DIAGNOSIS — M25551 Pain in right hip: Secondary | ICD-10-CM | POA: Diagnosis not present

## 2019-01-30 DIAGNOSIS — W19XXXA Unspecified fall, initial encounter: Secondary | ICD-10-CM | POA: Insufficient documentation

## 2019-01-30 DIAGNOSIS — R569 Unspecified convulsions: Secondary | ICD-10-CM | POA: Diagnosis not present

## 2019-01-30 DIAGNOSIS — M79601 Pain in right arm: Secondary | ICD-10-CM | POA: Diagnosis not present

## 2019-01-30 DIAGNOSIS — R519 Headache, unspecified: Secondary | ICD-10-CM | POA: Diagnosis not present

## 2019-01-30 DIAGNOSIS — S0990XA Unspecified injury of head, initial encounter: Secondary | ICD-10-CM | POA: Insufficient documentation

## 2019-02-22 DIAGNOSIS — Z79899 Other long term (current) drug therapy: Secondary | ICD-10-CM | POA: Diagnosis not present

## 2019-02-22 DIAGNOSIS — G894 Chronic pain syndrome: Secondary | ICD-10-CM | POA: Diagnosis not present

## 2019-02-28 ENCOUNTER — Other Ambulatory Visit: Payer: Self-pay | Admitting: Cardiology

## 2019-03-01 ENCOUNTER — Telehealth: Payer: Self-pay | Admitting: Cardiology

## 2019-03-01 MED ORDER — CARVEDILOL 12.5 MG PO TABS: 12.5000 mg | ORAL_TABLET | Freq: Two times a day (BID) | ORAL | 3 refills | Status: DC

## 2019-03-01 NOTE — Telephone Encounter (Signed)
Daughter  States patients  bp was elevated at office visit with Dr.Hall and he increased Coreg to 12.5 mg bid

## 2019-03-01 NOTE — Telephone Encounter (Signed)
Please give pt's daughter [name] a call @ [phone] concerning BP medications- Dr. Margo Aye has changed her medications due to pt being in the hospital and having nose bleeds

## 2019-03-03 NOTE — ED Notes (Signed)
Pt called ED to request phone number for provider referral on D/C instructions. Pt stated she called the nurse hotline as well and no longer needed phone number.

## 2019-03-18 ENCOUNTER — Other Ambulatory Visit: Payer: Self-pay

## 2019-03-18 ENCOUNTER — Ambulatory Visit (HOSPITAL_COMMUNITY)
Admission: RE | Admit: 2019-03-18 | Discharge: 2019-03-18 | Disposition: A | Discharge status: Home / Self Care | Payer: Medicare Other | Source: Ambulatory Visit | Attending: Internal Medicine | Admitting: Internal Medicine

## 2019-03-18 ENCOUNTER — Other Ambulatory Visit (HOSPITAL_COMMUNITY): Payer: Self-pay | Admitting: Internal Medicine

## 2019-03-18 DIAGNOSIS — M5440 Lumbago with sciatica, unspecified side: Secondary | ICD-10-CM

## 2019-03-18 DIAGNOSIS — M47814 Spondylosis without myelopathy or radiculopathy, thoracic region: Secondary | ICD-10-CM | POA: Diagnosis not present

## 2019-03-18 DIAGNOSIS — M545 Low back pain: Secondary | ICD-10-CM | POA: Diagnosis not present

## 2019-03-18 DIAGNOSIS — M549 Dorsalgia, unspecified: Secondary | ICD-10-CM | POA: Diagnosis not present

## 2019-03-22 DIAGNOSIS — G894 Chronic pain syndrome: Secondary | ICD-10-CM | POA: Diagnosis not present

## 2019-03-22 DIAGNOSIS — G8929 Other chronic pain: Secondary | ICD-10-CM | POA: Diagnosis not present

## 2019-03-22 DIAGNOSIS — M545 Low back pain: Secondary | ICD-10-CM | POA: Diagnosis not present

## 2019-03-22 DIAGNOSIS — Z79899 Other long term (current) drug therapy: Secondary | ICD-10-CM | POA: Diagnosis not present

## 2019-04-08 DIAGNOSIS — M8008XA Age-related osteoporosis with current pathological fracture, vertebra(e), initial encounter for fracture: Secondary | ICD-10-CM | POA: Diagnosis not present

## 2019-04-08 DIAGNOSIS — M545 Low back pain: Secondary | ICD-10-CM | POA: Diagnosis not present

## 2019-04-08 DIAGNOSIS — S32010A Wedge compression fracture of first lumbar vertebra, initial encounter for closed fracture: Secondary | ICD-10-CM | POA: Diagnosis not present

## 2019-04-09 ENCOUNTER — Other Ambulatory Visit (HOSPITAL_COMMUNITY): Payer: Self-pay | Admitting: Orthopedic Surgery

## 2019-04-09 DIAGNOSIS — M8008XA Age-related osteoporosis with current pathological fracture, vertebra(e), initial encounter for fracture: Secondary | ICD-10-CM

## 2019-04-10 ENCOUNTER — Other Ambulatory Visit: Payer: Self-pay

## 2019-04-10 ENCOUNTER — Ambulatory Visit (HOSPITAL_COMMUNITY)
Admission: RE | Admit: 2019-04-10 | Discharge: 2019-04-10 | Disposition: A | Discharge status: Home / Self Care | Payer: Medicare Other | Source: Ambulatory Visit | Attending: Orthopedic Surgery | Admitting: Orthopedic Surgery

## 2019-04-10 DIAGNOSIS — M8008XA Age-related osteoporosis with current pathological fracture, vertebra(e), initial encounter for fracture: Secondary | ICD-10-CM | POA: Insufficient documentation

## 2019-04-10 DIAGNOSIS — M545 Low back pain: Secondary | ICD-10-CM | POA: Diagnosis not present

## 2019-04-12 DIAGNOSIS — M4156 Other secondary scoliosis, lumbar region: Secondary | ICD-10-CM | POA: Diagnosis not present

## 2019-04-25 DIAGNOSIS — M545 Low back pain: Secondary | ICD-10-CM | POA: Diagnosis not present

## 2019-04-25 DIAGNOSIS — G8929 Other chronic pain: Secondary | ICD-10-CM | POA: Diagnosis not present

## 2019-04-25 DIAGNOSIS — Z79899 Other long term (current) drug therapy: Secondary | ICD-10-CM | POA: Diagnosis not present

## 2019-04-25 DIAGNOSIS — M5030 Other cervical disc degeneration, unspecified cervical region: Secondary | ICD-10-CM | POA: Diagnosis not present

## 2019-04-25 DIAGNOSIS — G894 Chronic pain syndrome: Secondary | ICD-10-CM | POA: Diagnosis not present

## 2019-04-29 ENCOUNTER — Ambulatory Visit: Payer: Medicare Other | Admitting: Cardiology

## 2019-04-29 ENCOUNTER — Encounter: Payer: Self-pay | Admitting: Cardiology

## 2019-04-29 ENCOUNTER — Other Ambulatory Visit: Payer: Self-pay

## 2019-04-29 VITALS — BP 150/68 | HR 74 | Temp 97.4°F | Ht 60.0 in | Wt 107.0 lb

## 2019-04-29 DIAGNOSIS — E782 Mixed hyperlipidemia: Secondary | ICD-10-CM

## 2019-04-29 DIAGNOSIS — I10 Essential (primary) hypertension: Secondary | ICD-10-CM | POA: Diagnosis not present

## 2019-04-29 DIAGNOSIS — I350 Nonrheumatic aortic (valve) stenosis: Secondary | ICD-10-CM

## 2019-04-29 DIAGNOSIS — I25119 Atherosclerotic heart disease of native coronary artery with unspecified angina pectoris: Secondary | ICD-10-CM | POA: Diagnosis not present

## 2019-04-29 NOTE — Patient Instructions (Signed)
Medication Instructions: STOP Brilinta     Labwork: Fasting Lipid and LFT's   Procedures/Testing: None today  Follow-Up: 6 months office visit with Dr.McDowell  Any Additional Special Instructions Will Be Listed Below (If Applicable).     If you need a refill on your cardiac medications before your next appointment, please call your pharmacy.

## 2019-04-29 NOTE — Progress Notes (Signed)
Cardiology Office Note  Date: 04/29/2019   ID: Beverly, [name] 1932-12-13, MRN [id number]  PCP:  Celene Squibb, MD  Cardiologist:  Rozann Lesches, MD Electrophysiologist:  None   Chief Complaint  Patient presents with  . Cardiac follow-up    History of Present Illness: Beverly [name] is an 86 y.o. female last seen by Ms. Strader PA-C in January.  She is here today with a family member for follow-up.  She does not report any active angina at this time on medical therapy.  I reviewed her medications today, she has continued on Brilinta along with aspirin.  DES intervention was in January 2020.  We discussed stopping Brilinta at this point.  She also tells me that she has had some falls, I am concerned about bleeding risk chronically.  Lisinopril was increased to 20 mg twice daily at the last visit with follow-up lab work noted below.  Blood pressure trend has been better.  She continues on high-dose Lipitor, no intolerances.  She has not had follow-up lab work as yet.  Past Medical History:  Diagnosis Date  . Anxiety   . Arthritis   . COPD (chronic obstructive pulmonary disease) ([place])   . Coronary artery disease    a. STEMI with cardiac arrest (polymorphic VT) s/p DES to RCA.  Marland Kitchen GERD (gastroesophageal reflux disease)   . Headache(784.0)   . Hearing loss, central    Left ear  . Hematoma   . Hypertension   . Memory changes   . Mild aortic stenosis   . Mild carotid artery disease ([place])    a. 1-39% by duplex 02/2018.  Marland Kitchen Seizures ([place]) 10/2012   No seizures, speech issues; after a fall, hitting head on left side    Past Surgical History:  Procedure Laterality Date  . ABDOMINAL HYSTERECTOMY    . APPENDECTOMY    . CARDIAC CATHETERIZATION     15 yrs ago  . CORONARY/GRAFT ACUTE MI REVASCULARIZATION N/A 01/10/2018   Procedure: Coronary/Graft Acute MI Revascularization;  Surgeon: Lorretta Harp, MD;  Location: [facility];  Service: Cardiovascular;  Laterality: N/A;   . CRANIOTOMY Left 10/30/2012   Procedure: CRANIOTOMY HEMATOMA EVACUATION SUBDURAL;  Surgeon: Erline Levine, MD;  Location: [facility];  Service: Neurosurgery;  Laterality: Left;  Left Craniotomy for evacuation of subdural hematoma  . JOINT REPLACEMENT Bilateral    knees  . LEFT HEART CATH AND CORONARY ANGIOGRAPHY N/A 01/10/2018   Procedure: LEFT HEART CATH AND CORONARY ANGIOGRAPHY;  Surgeon: Lorretta Harp, MD;  Location: [facility];  Service: Cardiovascular;  Laterality: N/A;    Current Outpatient Medications  Medication Sig Dispense Refill  . ALPRAZolam (XANAX) 0.5 MG tablet Take 0.5 mg by mouth 3 (three) times daily as needed for anxiety.    Marland Kitchen aspirin 81 MG chewable tablet Chew 1 tablet (81 mg total) by mouth daily.    Marland Kitchen atorvastatin (LIPITOR) 80 MG tablet Take 1 tablet (80 mg total) by mouth daily at 6 PM. 90 tablet 4  . carvedilol (COREG) 12.5 MG tablet Take 25 mg by mouth 2 (two) times daily with a meal.    . celecoxib (CELEBREX) 200 MG capsule Take 200 mg by mouth daily.    . cholecalciferol (VITAMIN D) 1000 UNITS tablet Take 1,000 Units by mouth daily.     . fluticasone (FLONASE) 50 MCG/ACT nasal spray Place 1 spray into both nostrils daily.     Marland Kitchen gabapentin (NEURONTIN) 300 MG  capsule Take 300 mg by mouth daily.    Marland Kitchen HYDROcodone-acetaminophen (NORCO) 10-325 MG tablet Take 1 tablet by mouth every 6 (six) hours as needed.    . latanoprost (XALATAN) 0.005 % ophthalmic solution     . levETIRAcetam (KEPPRA) 500 MG tablet Take 500 mg by mouth 2 (two) times daily.    Marland Kitchen levofloxacin (LEVAQUIN) 500 MG tablet Take 1 tablet (500 mg total) by mouth daily. 7 tablet 0  . lisinopril (ZESTRIL) 20 MG tablet Take 1 tablet (20 mg total) by mouth 2 (two) times daily. 60 tablet 6  . methocarbamol (ROBAXIN) 500 MG tablet Take 1 tablet by mouth daily.    . methocarbamol (ROBAXIN) 750 MG tablet Take 500 mg by mouth 2 (two) times daily.     . Multiple Vitamins-Minerals (PRESERVISION/LUTEIN PO)  Take 1 tablet by mouth 2 (two) times daily.    . nitroGLYCERIN (NITROSTAT) 0.4 MG SL tablet Place 1 tablet (0.4 mg total) under the tongue every 5 (five) minutes x 3 doses as needed for chest pain. 25 tablet 0  . pantoprazole (PROTONIX) 40 MG tablet Take 40 mg by mouth daily.    . potassium chloride (K-DUR) 10 MEQ tablet Take 10 mEq by mouth daily.     Marland Kitchen Propylene Glycol (SYSTANE BALANCE OP) Place 1-2 drops into both eyes daily as needed (dry eyes).    . sertraline (ZOLOFT) 100 MG tablet Take 100 mg by mouth daily.     No current facility-administered medications for this visit.   Allergies:  Eggs or egg-derived products, Morphine and related, and Penicillins   ROS:   Hearing loss.  Physical Exam: VS:  BP (!) 150/68   Pulse 74   Temp (!) 97.4 F (36.3 C) (Temporal)   Ht 5' (1.524 m)   Wt 107 lb (48.5 kg)   SpO2 97%   BMI 20.90 kg/m , BMI Body mass index is 20.9 kg/m.  Wt Readings from Last 3 Encounters:  04/29/19 107 lb (48.5 kg)  01/24/19 114 lb 9.6 oz (52 kg)  01/17/19 116 lb (52.6 kg)    General: Elderly woman, seated in wheelchair. Patient appears comfortable at rest. HEENT: Conjunctiva and lids normal, wearing a mask. Neck: Supple, no elevated JVP or carotid bruits, no thyromegaly. Lungs: Clear to auscultation, nonlabored breathing at rest. Cardiac: Regular rate and rhythm, no S3, 2-3/6 systolic murmur, no pericardial rub. Extremities: No pitting edema, distal pulses 2+.  ECG:  An ECG dated 01/09/2019 was personally reviewed today and demonstrated:  Sinus tachycardia with nonspecific ST changes.  Recent Labwork: 01/09/2019: BUN 10; Creatinine, Ser 0.52; Hemoglobin 11.4; Platelets 342; Potassium 3.8; Sodium 135     Component Value Date/Time   CHOL 134 03/12/2018 1450   TRIG 41 03/12/2018 1450   HDL 97 03/12/2018 1450   CHOLHDL 1.4 03/12/2018 1450   CHOLHDL 1.8 01/10/2018 0428   VLDL 8 01/10/2018 0428   LDLCALC 29 03/12/2018 1450    Other Studies Reviewed  Today:  Carotid Dopplers 01/26/2018: Summary: Right Carotid: Velocities in the right ICA are consistent with a 1-39% stenosis. The RICA velocities remain within normal range and essentially stable compared to the prior exam.  Left Carotid: Velocities in the left ICA are consistent with a 1-39% stenosis. The LICA velocities remain within normal range and have decreased compared to the prior exam.  Vertebrals: Bilateral vertebral arteries demonstrate antegrade flow. Subclavians: Normal flow hemodynamics were seen in bilateral subclavian arteries.  Echocardiogram 01/10/2018: Study Conclusions  - Left ventricle:  The cavity size was normal. Wall thickness was increased in a pattern of mild LVH. Basal to mid inferior akinesis. Basal inferoseptal akinesis. The estimated ejection fraction was 55%. Doppler parameters are consistent with abnormal left ventricular relaxation (grade 1 diastolic dysfunction). - Aortic valve: Trileaflet; moderately calcified leaflets. There was mild stenosis. Mean gradient (S): 13 mm Hg. Valve area (Vmean): 1.74 cm^2. - Mitral valve: There was trivial regurgitation. - Left atrium: The atrium was mildly dilated. - Right ventricle: The cavity size was normal. Systolic function was normal. - Pulmonary arteries: No complete TR doppler jet so unable to estimate PA systolic pressure. - Inferior vena cava: The vessel was normal in size. The respirophasic diameter changes were in the normal range (>= 50%), consistent with normal central venous pressure.  Impressions:  - Normal LV size with mild LV hypertrophy. EF 55% with basal to mid inferior akinesis and basal inferoseptal akinesis. Normal RV size and systolic function. Mild aortic stenosis.  Cardiac catheterization and PCI 01/10/2018:  Mid RCA lesion is 99% stenosed.  A drug-eluting stent was successfully  placed.  Post intervention, there is a 0% residual stenosis.  IMPRESSION:Successful PCI and drug-eluting stenting of a occluded dominant RCA with faint left-to-right collaterals. Vessel opened with injection and was subtotally occluded with visible thrombus. The door to balloon time was 27 minutes.The patient remained hemodynamically stable throughout the case on IV epinephrine and amiodarone. Angiomax will continue for 4 hours full dose. The sheath was then removed be removed and pressure held. Her enzymes will be cycled. 2D echo has been ordered. She will be treated with routine post MI pharmacology including high-dose statin drug, beta-blocker depending on her blood pressure. She can probably be "fast tracked" given her normal left system. She left the lab in stable condition.  Assessment and Plan:  1.  CAD status post STEMI with polymorphic VT in January 2020 and status post DES to the RCA at that time.  She has done well without recurrent angina and LVEF is normal range based on last assessment.  Plan to stop Brilinta as discussed above.  Otherwise continue aspirin, Coreg, Lipitor, lisinopril, and as needed nitroglycerin.  2.  Mixed hyperlipidemia, remains on high-dose Lipitor without intolerances.  Check FLP and LFTs.  3.  Essential hypertension, continue with current regimen and keep follow-up with Dr. Margo Aye.  Systolic is 150 today.  Could always add low-dose Norvasc as a next step.  Medication Adjustments/Labs and Tests Ordered: Current medicines are reviewed at length with the patient today.  Concerns regarding medicines are outlined above.   Tests Ordered: Orders Placed This Encounter  Procedures  . Lipid Profile  . Hepatic function panel    Medication Changes: No orders of the defined types were placed in this encounter.   Disposition:  Follow up 6 months in the [place] office.  Signed, Jonelle Sidle, MD, [facility] 04/29/2019 2:25 PM    [facility] [address], [place], [place] [address] Phone: [phone]; Fax: [phone]

## 2019-05-03 ENCOUNTER — Emergency Department (HOSPITAL_COMMUNITY): Payer: Medicare Other

## 2019-05-03 ENCOUNTER — Other Ambulatory Visit: Payer: Self-pay

## 2019-05-03 ENCOUNTER — Telehealth: Payer: Self-pay | Admitting: Cardiology

## 2019-05-03 ENCOUNTER — Encounter (HOSPITAL_COMMUNITY): Payer: Self-pay | Admitting: Emergency Medicine

## 2019-05-03 ENCOUNTER — Emergency Department (HOSPITAL_COMMUNITY)
Admission: EM | Admit: 2019-05-03 | Discharge: 2019-05-03 | Disposition: A | Discharge status: Home / Self Care | Payer: Medicare Other | Attending: Emergency Medicine | Admitting: Emergency Medicine

## 2019-05-03 DIAGNOSIS — J449 Chronic obstructive pulmonary disease, unspecified: Secondary | ICD-10-CM | POA: Diagnosis not present

## 2019-05-03 DIAGNOSIS — W19XXXA Unspecified fall, initial encounter: Secondary | ICD-10-CM | POA: Diagnosis not present

## 2019-05-03 DIAGNOSIS — Y939 Activity, unspecified: Secondary | ICD-10-CM | POA: Diagnosis not present

## 2019-05-03 DIAGNOSIS — T783XXA Angioneurotic edema, initial encounter: Secondary | ICD-10-CM | POA: Diagnosis not present

## 2019-05-03 DIAGNOSIS — S0990XA Unspecified injury of head, initial encounter: Secondary | ICD-10-CM | POA: Insufficient documentation

## 2019-05-03 DIAGNOSIS — M25511 Pain in right shoulder: Secondary | ICD-10-CM | POA: Diagnosis not present

## 2019-05-03 DIAGNOSIS — Z79899 Other long term (current) drug therapy: Secondary | ICD-10-CM | POA: Diagnosis not present

## 2019-05-03 DIAGNOSIS — R609 Edema, unspecified: Secondary | ICD-10-CM | POA: Diagnosis not present

## 2019-05-03 DIAGNOSIS — Y999 Unspecified external cause status: Secondary | ICD-10-CM | POA: Diagnosis not present

## 2019-05-03 DIAGNOSIS — W010XXA Fall on same level from slipping, tripping and stumbling without subsequent striking against object, initial encounter: Secondary | ICD-10-CM | POA: Diagnosis not present

## 2019-05-03 DIAGNOSIS — Z96653 Presence of artificial knee joint, bilateral: Secondary | ICD-10-CM | POA: Diagnosis not present

## 2019-05-03 DIAGNOSIS — I10 Essential (primary) hypertension: Secondary | ICD-10-CM | POA: Insufficient documentation

## 2019-05-03 DIAGNOSIS — Z7982 Long term (current) use of aspirin: Secondary | ICD-10-CM | POA: Insufficient documentation

## 2019-05-03 DIAGNOSIS — Y929 Unspecified place or not applicable: Secondary | ICD-10-CM | POA: Diagnosis not present

## 2019-05-03 DIAGNOSIS — Z743 Need for continuous supervision: Secondary | ICD-10-CM | POA: Diagnosis not present

## 2019-05-03 DIAGNOSIS — I2510 Atherosclerotic heart disease of native coronary artery without angina pectoris: Secondary | ICD-10-CM | POA: Insufficient documentation

## 2019-05-03 DIAGNOSIS — R296 Repeated falls: Secondary | ICD-10-CM | POA: Diagnosis not present

## 2019-05-03 NOTE — Discharge Instructions (Addendum)
You have been diagnosed today with Fall, Head Injury, Should Pain, Angioedema.  At this time there does not appear to be the presence of an emergent medical condition, however there is always the potential for conditions to change. Please read and follow the below instructions.  Please return to the Emergency Department immediately for any new or worsening symptoms. Please be sure to follow up with your Primary Care Provider within one week regarding your visit today; please call their office to schedule an appointment even if you are feeling better for a follow-up visit. Stop taking the lisinopril immediately as this may be a cause of the small mount of swelling to your upper lip.  Call your primary care doctor and cardiologist today to inform them of your medication change and to schedule a follow-up visit for reevaluation.  If you develop any worsening of your swelling or if you have difficulty breathing, cough, chest pain or swelling inside the mouth or of the tongue or new concerns return immediately to the ER for evaluation. You may use a sling given to you today to help with your right shoulder pain.  You may follow-up with the orthopedist Dr. Aundria Rud on your discharge paperwork for reevaluation.  Get help right away if: You have: A very bad headache that is not helped by medicine. Trouble walking or weakness in your arms and legs. Clear or bloody fluid coming from your nose or ears. Changes in how you see (vision). Shaking movements that you cannot control. You lose your balance. You vomit. The black centers of your eyes (pupils) change in size. Your speech is slurred. Your dizziness gets worse. You pass out. You are sleepier than normal and have trouble staying awake. Your mouth, tongue, or lips get very swollen. You have trouble breathing. You have trouble swallowing. You pass out (faint). You have any new/concerning or worsening of symptoms  Please read the additional  information packets attached to your discharge summary.  Do not take your medicine if  develop an itchy rash, swelling in your mouth or lips, or difficulty breathing; call 911 and seek immediate emergency medical attention if this occurs.  Note: Portions of this text may have been transcribed using voice recognition software. Every effort was made to ensure accuracy; however, inadvertent computerized transcription errors may still be present.

## 2019-05-03 NOTE — Telephone Encounter (Signed)
Noted, will FYI Dr.McDowell

## 2019-05-03 NOTE — Telephone Encounter (Signed)
Beverly [name] - daughter called stating that patient was taken off Brilinta 90 mg on 04/29/2019. States that 3 days ago her lips started swelling. Daughter is wanting to know if this could be from coming off the medication. Patient is not having any shortness of breath.   [phone].

## 2019-05-03 NOTE — ED Notes (Signed)
Patient refused shoulder immobolizer.

## 2019-05-03 NOTE — ED Notes (Signed)
Patient ambulatory  

## 2019-05-03 NOTE — Telephone Encounter (Signed)
Daughter is going to call pharmacist to discuss.

## 2019-05-03 NOTE — ED Triage Notes (Signed)
Patient fell and hit her head. Denies LOC and N/V. Patient has a hematoma with abrasion to the right side of her head. Patient stopped Brilinta on Monday of this week due to multiple falls.

## 2019-05-03 NOTE — ED Provider Notes (Addendum)
[facility] EMERGENCY DEPARTMENT Provider Note   CSN: [id number] Arrival date & time: 05/03/19  1306     History Chief Complaint  Patient presents with  . Fall    Beverly [name] is a 86 y.o. female history of COPD, CAD, GERD, hypertension, memory loss, aortic stenosis, hypertension.  Patient presents today with her daughter after a fall, she was picking up a piece of paper off of the ground when she reports that her right knee "gave out" causing her to fall striking the right side of her head on the ground and falling onto her right shoulder.  She denies any loss of consciousness, reports minimal right sided head tenderness, a mild throbbing sensation worsened with palpation improved with time and rest, pain is nonradiating.  Additionally describes mild left shoulder pain along the lateral deltoid, nonradiating worse with palpation improved with rest.  She denies any other pain or injuries today.  Of note patient stopped taking Brilinta 4 days ago, she was on the medication following an MI.  Additionally patient's daughter at bedside reports that she has noticed swelling to the patient's upper lip onset 3 days ago, mild swelling that she feels is gradually worsening, patient reports that the area feels full but is not causing her much discomfort.  Denies loss of consciousness, headache, vision changes, sore throat, difficulty swallowing, neck pain, cough/shortness of breath, chest pain, abdominal pain, back pain, pelvic pain, pain to the other 3 extremities, numbness/tingling, weakness or any additional concerns today.  Of note daughter reports that patient tetanus is up-to-date within the last 2-3 years.  HPI     Past Medical History:  Diagnosis Date  . Anxiety   . Arthritis   . COPD (chronic obstructive pulmonary disease) (HCC)   . Coronary artery disease    a. STEMI with cardiac arrest (polymorphic VT) s/p DES to RCA.  Marland Kitchen GERD (gastroesophageal reflux disease)   . Headache(784.0)    . Hearing loss, central    Left ear  . Hematoma   . Hypertension   . Memory changes   . Mild aortic stenosis   . Mild carotid artery disease (HCC)    a. 1-39% by duplex 02/2018.  Marland Kitchen Seizures (HCC) 10/2012   No seizures, speech issues; after a fall, hitting head on left side    Patient Active Problem List   Diagnosis Date Noted  . Hypokalemia 01/12/2018  . Hypomagnesemia 01/12/2018  . COPD (chronic obstructive pulmonary disease) (HCC) 01/12/2018  . Acute ST elevation myocardial infarction (STEMI) of inferior wall (HCC) 01/10/2018  . Cardiac arrest (HCC)   . Dyslipidemia (high LDL; low HDL)   . 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 History of traumatic subdural hematoma 06/23/2014  . Subdural hematoma (HCC) 10/18/2012  . TIA (transient ischemic attack) 10/18/2012  . HTN (hypertension) 10/18/2012  . Hyponatremia 10/18/2012    Past Surgical History:  Procedure Laterality Date  . ABDOMINAL HYSTERECTOMY    . APPENDECTOMY    . CARDIAC CATHETERIZATION     15 yrs ago  . CORONARY/GRAFT ACUTE MI REVASCULARIZATION N/A 01/10/2018   Procedure: Coronary/Graft Acute MI Revascularization;  Surgeon: Runell Gess, MD;  Location: [facility] INVASIVE CV LAB;  Service: Cardiovascular;  Laterality: N/A;  . CRANIOTOMY Left 10/30/2012   Procedure: CRANIOTOMY HEMATOMA EVACUATION SUBDURAL;  Surgeon: Maeola Harman, MD;  Location: [facility] NEURO ORS;  Service: Neurosurgery;  Laterality: Left;  Left Craniotomy for evacuation of  subdural hematoma  . JOINT REPLACEMENT Bilateral    knees  . LEFT HEART CATH AND CORONARY ANGIOGRAPHY N/A 01/10/2018   Procedure: LEFT HEART CATH AND CORONARY ANGIOGRAPHY;  Surgeon: Runell Gess, MD;  Location: [facility] INVASIVE CV LAB;  Service: Cardiovascular;  Laterality: N/A;     OB History   No obstetric history on file.     Family History  Problem Relation Age  of Onset  . Cancer Father   . Ataxia Neg Hx   . Chorea Neg Hx   . Dementia Neg Hx   . Mental retardation Neg Hx   . Migraines Neg Hx   . Multiple sclerosis Neg Hx   . Neurofibromatosis Neg Hx   . Neuropathy Neg Hx   . Parkinsonism Neg Hx   . Seizures Neg Hx   . Stroke Neg Hx     Social History   Tobacco Use  . Smoking status: Never Smoker  . Smokeless tobacco: Never Used  Substance Use Topics  . Alcohol use: No  . Drug use: No    Home Medications Prior to Admission medications   Medication Sig Start Date End Date Taking? Authorizing Provider  ALPRAZolam Prudy Feeler) 0.5 MG tablet Take 0.5 mg by mouth 3 (three) times daily as needed for anxiety.    [provider]  aspirin 81 MG chewable tablet Chew 1 tablet (81 mg total) by mouth daily. 01/13/18   Filbert Schilder, NP  atorvastatin (LIPITOR) 80 MG tablet Take 1 tablet (80 mg total) by mouth daily at 6 PM. 01/12/18   Filbert Schilder, NP  carvedilol (COREG) 12.5 MG tablet Take 25 mg by mouth 2 (two) times daily with a meal.    [provider]  celecoxib (CELEBREX) 200 MG capsule Take 200 mg by mouth daily.    [provider]  cholecalciferol (VITAMIN D) 1000 UNITS tablet Take 1,000 Units by mouth daily.     [provider]  fluticasone (FLONASE) 50 MCG/ACT nasal spray Place 1 spray into both nostrils daily.  09/18/12   [provider]  gabapentin (NEURONTIN) 300 MG capsule Take 300 mg by mouth daily. 04/12/19   [provider]  HYDROcodone-acetaminophen (NORCO) 10-325 MG tablet Take 1 tablet by mouth every 6 (six) hours as needed.    [provider]  latanoprost (XALATAN) 0.005 % ophthalmic solution  03/01/18   [provider]  levETIRAcetam (KEPPRA) 500 MG tablet Take 500 mg by mouth 2 (two) times daily.    [provider]  levofloxacin (LEVAQUIN) 500 MG tablet Take 1 tablet (500 mg total) by mouth daily. 01/10/19   Charlynne Pander, MD  methocarbamol  (ROBAXIN) 500 MG tablet Take 1 tablet by mouth daily. 02/22/19   [provider]  methocarbamol (ROBAXIN) 750 MG tablet Take 500 mg by mouth 2 (two) times daily.     [provider]  Multiple Vitamins-Minerals (PRESERVISION/LUTEIN PO) Take 1 tablet by mouth 2 (two) times daily.    [provider]  nitroGLYCERIN (NITROSTAT) 0.4 MG SL tablet Place 1 tablet (0.4 mg total) under the tongue every 5 (five) minutes x 3 doses as needed for chest pain. 01/12/18   Georgie Chard D, NP  pantoprazole (PROTONIX) 40 MG tablet Take 40 mg by mouth daily.    [provider]  potassium chloride (K-DUR) 10 MEQ tablet Take 10 mEq by mouth daily.     [provider]  Propylene Glycol (SYSTANE BALANCE OP) Place 1-2 drops into both  eyes daily as needed (dry eyes).    [provider]  sertraline (ZOLOFT) 100 MG tablet Take 100 mg by mouth daily. 03/29/19   [provider]  lisinopril (ZESTRIL) 20 MG tablet Take 1 tablet (20 mg total) by mouth 2 (two) times daily. 01/24/19 05/03/19  Erma Heritage, PA-C    Allergies    Eggs or egg-derived products, Morphine and related, and Penicillins  Review of Systems   Review of Systems Ten systems are reviewed and are negative for acute change except as noted in the HPI  Physical Exam Updated Vital Signs BP (!) 154/74   Pulse 67   Temp 98.1 F (36.7 C) (Oral)   Resp 15   Ht 5' (1.524 m)   Wt 48.5 kg   SpO2 96%   BMI 20.90 kg/m   Physical Exam Constitutional:      General: She is not in acute distress.    Appearance: Normal appearance. She is well-developed. She is not ill-appearing or diaphoretic.  HENT:     Head: Normocephalic and atraumatic. No raccoon eyes or Battle's sign.     Jaw: There is normal jaw occlusion. No trismus.      Comments: Subcentimeter abrasion - Minimal swelling of the upper lip.    Right Ear: External ear normal. No hemotympanum.     Left Ear: External ear normal. No  hemotympanum.     Nose: Nose normal.     Right Nostril: No epistaxis.     Left Nostril: No epistaxis.     Mouth/Throat:     Mouth: Mucous membranes are moist.     Pharynx: Oropharynx is clear.     Comments: No evidence of significant dental injury. Eyes:     General: Vision grossly intact. Gaze aligned appropriately.     Extraocular Movements: Extraocular movements intact.     Conjunctiva/sclera: Conjunctivae normal.     Pupils: Pupils are equal, round, and reactive to light.     Comments: Visual fields grossly intact bilaterally No pain with extraocular motion  Neck:     Trachea: Trachea and phonation normal. No tracheal tenderness or tracheal deviation.  Cardiovascular:     Rate and Rhythm: Normal rate and regular rhythm.     Pulses:          Radial pulses are 2+ on the right side and 2+ on the left side.       Dorsalis pedis pulses are 2+ on the right side and 2+ on the left side.  Pulmonary:     Effort: Pulmonary effort is normal. No respiratory distress.     Breath sounds: Normal breath sounds and air entry.  Chest:     Chest wall: No deformity, tenderness or crepitus.     Comments: No evidence of injury to the chest Abdominal:     General: There is no distension.     Palpations: Abdomen is soft.     Tenderness: There is no abdominal tenderness. There is no guarding or rebound.     Comments: No evidence of injury to the abdomen  Musculoskeletal:        General: Normal range of motion.     Cervical back: Full passive range of motion without pain, normal range of motion and neck supple. No spinous process tenderness or muscular tenderness.     Comments: Mild tenderness of the right deltoid muscle without overlying skin change.  No bony tenderness or step-off.  Range of motion intact and appropriate for age with  minimal increased pain.  Range of motion at the right elbow, right wrist and hand intact with appropriate strength.  Capillary refill and sensation intact to fingers.   Strong equal radial pulses.  No tenderness or pain of the neck. - No midline C/T/L spinal tenderness to palpation, no paraspinal muscle tenderness, no deformity, crepitus, or step-off noted. No sign of injury to the neck or back.  Feet:     Right foot:     Protective Sensation: 5 sites tested. 5 sites sensed.     Left foot:     Protective Sensation: 5 sites tested. 5 sites sensed.  Skin:    General: Skin is warm and dry.  Neurological:     Mental Status: She is alert.     GCS: GCS eye subscore is 4. GCS verbal subscore is 5. GCS motor subscore is 6.     Comments: Speech is clear and goal oriented, follows commands Major Cranial nerves without deficit, no facial droop Normal strength in upper and lower extremities bilaterally including dorsiflexion and plantar flexion, strong and equal grip strength Sensation normal to light and sharp touch Moves extremities without ataxia, coordination intact  Psychiatric:        Behavior: Behavior normal.     ED Results / Procedures / Treatments   Labs (all labs ordered are listed, but only abnormal results are displayed) Labs Reviewed - No data to display  EKG None  Radiology DG Shoulder Right  Result Date: 05/03/2019 CLINICAL DATA:  Acute RIGHT shoulder pain following fall today. Initial encounter. EXAM: RIGHT SHOULDER - 2+ VIEW COMPARISON:  10/31/2016 radiographs FINDINGS: No acute fracture, subluxation or dislocation noted. Moderate degenerative changes at the glenohumeral joint and AC joint noted. No focal bony lesions are present. IMPRESSION: 1. No evidence of acute bony abnormality. 2. Moderate degenerative changes. Electronically Signed   By: Harmon PierJeffrey  Hu M.D.   On: 05/03/2019 14:22   CT Head Wo Contrast  Result Date: 05/03/2019 CLINICAL DATA:  Multiple falls, recently stopped blood thinner EXAM: CT HEAD WITHOUT CONTRAST CT CERVICAL SPINE WITHOUT CONTRAST TECHNIQUE: Multidetector CT imaging of the head and cervical spine was performed  following the standard protocol without intravenous contrast. Multiplanar CT image reconstructions of the cervical spine were also generated. COMPARISON:  01/30/2019 FINDINGS: CT HEAD FINDINGS Brain: No evidence of acute infarction, hemorrhage, hydrocephalus, extra-axial collection or mass lesion/mass effect. Periventricular white matter hypodensity Vascular: No hyperdense vessel or unexpected calcification. Skull: Status post left pterional craniotomy. Negative for fracture or focal lesion. Sinuses/Orbits: No acute finding. Other: Soft tissue contusion of the right frontal scalp. CT CERVICAL SPINE FINDINGS Alignment: Degenerative straightening and reversal of the normal cervical lordosis with mild degenerative anterolisthesis of C3 on C4. Skull base and vertebrae: No acute fracture. No primary bone lesion or focal pathologic process. Soft tissues and spinal canal: No prevertebral fluid or swelling. No visible canal hematoma. Disc levels: Severe multilevel disc degenerative disease and osteophytosis. Upper chest: Negative. Other: None. IMPRESSION: 1. No acute intracranial pathology. Small-vessel white matter disease. 2.  Soft tissue contusion of the right frontal scalp. 3.  No fracture or static subluxation of the cervical spine. 4. Severe multilevel disc degenerative disease and osteophytosis of cervical spine notable for degenerative anterolisthesis of C3 on C4, unchanged compared to prior examination. Electronically Signed   By: Lauralyn PrimesAlex  Bibbey M.D.   On: 05/03/2019 14:34   CT Cervical Spine Wo Contrast  Result Date: 05/03/2019 CLINICAL DATA:  Multiple falls, recently stopped blood thinner EXAM:  CT HEAD WITHOUT CONTRAST CT CERVICAL SPINE WITHOUT CONTRAST TECHNIQUE: Multidetector CT imaging of the head and cervical spine was performed following the standard protocol without intravenous contrast. Multiplanar CT image reconstructions of the cervical spine were also generated. COMPARISON:  01/30/2019 FINDINGS: CT  HEAD FINDINGS Brain: No evidence of acute infarction, hemorrhage, hydrocephalus, extra-axial collection or mass lesion/mass effect. Periventricular white matter hypodensity Vascular: No hyperdense vessel or unexpected calcification. Skull: Status post left pterional craniotomy. Negative for fracture or focal lesion. Sinuses/Orbits: No acute finding. Other: Soft tissue contusion of the right frontal scalp. CT CERVICAL SPINE FINDINGS Alignment: Degenerative straightening and reversal of the normal cervical lordosis with mild degenerative anterolisthesis of C3 on C4. Skull base and vertebrae: No acute fracture. No primary bone lesion or focal pathologic process. Soft tissues and spinal canal: No prevertebral fluid or swelling. No visible canal hematoma. Disc levels: Severe multilevel disc degenerative disease and osteophytosis. Upper chest: Negative. Other: None. IMPRESSION: 1. No acute intracranial pathology. Small-vessel white matter disease. 2.  Soft tissue contusion of the right frontal scalp. 3.  No fracture or static subluxation of the cervical spine. 4. Severe multilevel disc degenerative disease and osteophytosis of cervical spine notable for degenerative anterolisthesis of C3 on C4, unchanged compared to prior examination. Electronically Signed   By: Lauralyn Primes M.D.   On: 05/03/2019 14:34    Procedures Procedures (including critical care time)  Medications Ordered in ED Medications - No data to display  ED Course  I have reviewed the triage vital signs and the nursing notes.  Pertinent labs & imaging results that were available during my care of the patient were reviewed by me and considered in my medical decision making (see chart for details).  Clinical Course as of May 02 1704  Fri May 03, 2019  7530 86 year old female recently on Brilinta here after a fall. Struck her head and right shoulder. When asked her where she has pain she says all over from her arthritis. Getting CT head and  cervical spine along with x-rays of the shoulder. If these are unremarkable likely try to ambulate her in the department and possibly discharge.   [MB]    Clinical Course User Index [MB] Terrilee Files, MD   MDM Rules/Calculators/A&P                     86 year old female who recently stopped taking Brilinta presents today after a mechanical fall striking her right forehead on the ground.  No loss of consciousness, only other concern today is for mild right shoulder pain.  Will obtain CT head and CT cervical spine in addition to right shoulder x-ray for evaluation of injury today.  Patient is neurovascularly intact to all 4 extremities and has no cranial nerve deficit.  She has no signs of significant injury of the head, neck, chest, abdomen, pelvis, back or extremities.  Additionally patient has evidence of mild angioedema of the upper lip, she has no airway compromise, no cough or respiratory symptoms today.  This does not appear as cellulitis, suspect angioedema is from patient's lisinopril use she has no other identifiable factors at this time and no new exposures.  Plan to have patient stop taking lisinopril and contact her PCP/cardiologist office to inform them of medication change and need for follow-up visit.  Discussed case with Dr. Charm Barges who will see patient. - CT head/cervical spine:  IMPRESSION:  1. No acute intracranial pathology. Small-vessel white matter  disease.    2.  Soft tissue contusion of the right frontal scalp.    3. No fracture or static subluxation of the cervical spine.    4. Severe multilevel disc degenerative disease and osteophytosis of  cervical spine notable for degenerative anterolisthesis of C3 on C4,  unchanged compared to prior examination.   I personally reviewed patient's CT scans, no obvious intracranial bleed, additionally no obvious C-spine fracture.  Agree with radiologist interpretation above.  DG Right Shoulder:  IMPRESSION:  1. No  evidence of acute bony abnormality.  2. Moderate degenerative changes.  I personally reviewed patient's right shoulder x-ray, no obvious fracture or dislocation agree with radiologist interpretation above. - Patient placed in sling for shoulder pain, referred to Ortho, encouraged rice therapy and PCP follow-up.  No indication for further work-up at this time.  As above we will have patient stop taking lisinopril as suspected ACE inhibitor angioedema, she has no airway compromise, respiratory symptoms or additional concerns today. Patient ambulated with nurse tech without difficulty.  Patient has been in the ER for 4 hours, no change to the very minimal swelling of the upper lip, difficult to tell of any swelling on exam however daughter feels that this is present but has not worsened since arrival.  Patient stable for discharge. - 5:12 PM: Patient reassessed awaiting sling.  Reevaluated patient's lip, her daughter at bedside reports that she feels that it is beginning to improve. - At this time there does not appear to be any evidence of an acute emergency medical condition and the patient appears stable for discharge with appropriate outpatient follow up. Diagnosis was discussed with patient who verbalizes understanding of care plan and is agreeable to discharge. I have discussed return precautions with patient who verbalizes understanding. Patient encouraged to follow-up with their PCP. All questions answered.  Patient's case discussed with Dr. Charm Barges who has seen and evaluated patient.  Dr. Charm Barges agrees with discharge and outpatient follow-up and to stop lisinopril.  Note: Portions of this report may have been transcribed using voice recognition software. Every effort was made to ensure accuracy; however, inadvertent computerized transcription errors may still be present. Final Clinical Impression(s) / ED Diagnoses Final diagnoses:  Fall, initial encounter  Injury of head, initial encounter    Acute pain of right shoulder  Angioedema of lips, initial encounter    Rx / DC Orders ED Discharge Orders    None       Elizabeth Palau 05/03/19 1707    Bill Salinas, PA-C 05/03/19 1712    Terrilee Files, MD 05/03/19 [phone]

## 2019-05-03 NOTE — Telephone Encounter (Signed)
Patient is currently in the ER at [facility] from a fall. Daughter states that the ER doctor is taking patient off of lisinopril (ZESTRIL) 20 MG tablet was instructed to contact our office.

## 2019-05-13 ENCOUNTER — Encounter (INDEPENDENT_AMBULATORY_CARE_PROVIDER_SITE_OTHER): Payer: Self-pay | Admitting: Ophthalmology

## 2019-05-13 ENCOUNTER — Ambulatory Visit (INDEPENDENT_AMBULATORY_CARE_PROVIDER_SITE_OTHER): Payer: Medicare Other | Admitting: Ophthalmology

## 2019-05-13 ENCOUNTER — Telehealth: Payer: Self-pay | Admitting: *Deleted

## 2019-05-13 ENCOUNTER — Other Ambulatory Visit: Payer: Self-pay

## 2019-05-13 DIAGNOSIS — H353212 Exudative age-related macular degeneration, right eye, with inactive choroidal neovascularization: Secondary | ICD-10-CM | POA: Diagnosis not present

## 2019-05-13 DIAGNOSIS — H353211 Exudative age-related macular degeneration, right eye, with active choroidal neovascularization: Secondary | ICD-10-CM

## 2019-05-13 DIAGNOSIS — H2513 Age-related nuclear cataract, bilateral: Secondary | ICD-10-CM | POA: Diagnosis not present

## 2019-05-13 DIAGNOSIS — H353221 Exudative age-related macular degeneration, left eye, with active choroidal neovascularization: Secondary | ICD-10-CM | POA: Insufficient documentation

## 2019-05-13 DIAGNOSIS — H401131 Primary open-angle glaucoma, bilateral, mild stage: Secondary | ICD-10-CM | POA: Diagnosis not present

## 2019-05-13 DIAGNOSIS — H2512 Age-related nuclear cataract, left eye: Secondary | ICD-10-CM | POA: Insufficient documentation

## 2019-05-13 MED ORDER — BEVACIZUMAB CHEMO INJECTION 1.25MG/0.05ML SYRINGE FOR KALEIDOSCOPE
1.2500 mg | INTRAVITREAL | Status: AC | PRN
  Administered 2019-05-13: 1.25 mg via INTRAVITREAL

## 2019-05-13 NOTE — Progress Notes (Signed)
05/13/2019     CHIEF COMPLAINT Patient presents for Retina Follow Up   HISTORY OF PRESENT ILLNESS: Beverly [name] is a 86 y.o. female who presents to the clinic today for:   HPI    Retina Follow Up    Patient presents with  Wet AMD.  In both eyes.  This started 5 months ago.  Severity is mild.  Duration of 5 months.          Comments    5 Month AMD F/U OU  Pt has missed appts due to blood pressure issues and falling episodes. Pt c/o recent floaters and spots in [place] OU x 2 months approx off and on. Pt c/o seeing "different colors" OU off and on.       Last edited by Ileana Roup, COA on 05/13/2019  1:43 PM. (History)      Referring physician: Benita Stabile, MD [address] [place],  [place] [address]  HISTORICAL INFORMATION:   Selected notes from the MEDICAL RECORD NUMBER    Lab Results  Component Value Date   HGBA1C 6.1 (H) 10/19/2012     CURRENT MEDICATIONS: Current Outpatient Medications (Ophthalmic Drugs)  Medication Sig  . latanoprost (XALATAN) 0.005 % ophthalmic solution   . Propylene Glycol (SYSTANE BALANCE OP) Place 1-2 drops into both eyes daily as needed (dry eyes).   No current facility-administered medications for this visit. (Ophthalmic Drugs)   Current Outpatient Medications (Other)  Medication Sig  . ALPRAZolam (XANAX) 0.5 MG tablet Take 0.5 mg by mouth 3 (three) times daily as needed for anxiety.  Marland Kitchen aspirin 81 MG chewable tablet Chew 1 tablet (81 mg total) by mouth daily.  Marland Kitchen atorvastatin (LIPITOR) 80 MG tablet Take 1 tablet (80 mg total) by mouth daily at 6 PM.  . carvedilol (COREG) 12.5 MG tablet Take 25 mg by mouth 2 (two) times daily with a meal.  . celecoxib (CELEBREX) 200 MG capsule Take 200 mg by mouth daily.  . cholecalciferol (VITAMIN D) 1000 UNITS tablet Take 1,000 Units by mouth daily.   . fluticasone (FLONASE) 50 MCG/ACT nasal spray Place 1 spray into both nostrils daily.   Marland Kitchen gabapentin (NEURONTIN) 300 MG capsule Take 300 mg by  mouth daily.  Marland Kitchen HYDROcodone-acetaminophen (NORCO) 10-325 MG tablet Take 1 tablet by mouth every 6 (six) hours as needed.  . levETIRAcetam (KEPPRA) 500 MG tablet Take 500 mg by mouth 2 (two) times daily.  Marland Kitchen levofloxacin (LEVAQUIN) 500 MG tablet Take 1 tablet (500 mg total) by mouth daily.  . methocarbamol (ROBAXIN) 500 MG tablet Take 1 tablet by mouth daily.  . methocarbamol (ROBAXIN) 750 MG tablet Take 500 mg by mouth 2 (two) times daily.   . Multiple Vitamins-Minerals (PRESERVISION/LUTEIN PO) Take 1 tablet by mouth 2 (two) times daily.  . nitroGLYCERIN (NITROSTAT) 0.4 MG SL tablet Place 1 tablet (0.4 mg total) under the tongue every 5 (five) minutes x 3 doses as needed for chest pain.  . pantoprazole (PROTONIX) 40 MG tablet Take 40 mg by mouth daily.  . potassium chloride (K-DUR) 10 MEQ tablet Take 10 mEq by mouth daily.   . sertraline (ZOLOFT) 100 MG tablet Take 100 mg by mouth daily.   No current facility-administered medications for this visit. (Other)      REVIEW OF SYSTEMS:    ALLERGIES Allergies  Allergen Reactions  . Lisinopril Swelling  . Eggs Or Egg-Derived Products Itching    Patient reports that she can eat eggs but  does not tolerate egg derived products  . Morphine And Related     Headache  . Penicillins Itching    DID THE REACTION INVOLVE: Swelling of the face/tongue/throat, SOB, or low BP? No Sudden or severe rash/hives, skin peeling, or the inside of the mouth or nose? No Did it require medical treatment? No When did it last happen? Unknown If all above answers are "NO", may proceed with cephalosporin use.     PAST MEDICAL HISTORY Past Medical History:  Diagnosis Date  . Anxiety   . Arthritis   . COPD (chronic obstructive pulmonary disease) (HCC)   . Coronary artery disease    a. STEMI with cardiac arrest (polymorphic VT) s/p DES to RCA.  Marland Kitchen GERD (gastroesophageal reflux disease)   . Headache(784.0)   . Hearing loss, central    Left ear  . Hematoma    . Hypertension   . Memory changes   . Mild aortic stenosis   . Mild carotid artery disease (HCC)    a. 1-39% by duplex 02/2018.  Marland Kitchen Seizures (HCC) 10/2012   No seizures, speech issues; after a fall, hitting head on left side   Past Surgical History:  Procedure Laterality Date  . ABDOMINAL HYSTERECTOMY    . APPENDECTOMY    . CARDIAC CATHETERIZATION     15 yrs ago  . CORONARY/GRAFT ACUTE MI REVASCULARIZATION N/A 01/10/2018   Procedure: Coronary/Graft Acute MI Revascularization;  Surgeon: Runell Gess, MD;  Location: [facility] INVASIVE CV LAB;  Service: Cardiovascular;  Laterality: N/A;  . CRANIOTOMY Left 10/30/2012   Procedure: CRANIOTOMY HEMATOMA EVACUATION SUBDURAL;  Surgeon: Maeola Harman, MD;  Location: [facility] NEURO ORS;  Service: Neurosurgery;  Laterality: Left;  Left Craniotomy for evacuation of subdural hematoma  . JOINT REPLACEMENT Bilateral    knees  . LEFT HEART CATH AND CORONARY ANGIOGRAPHY N/A 01/10/2018   Procedure: LEFT HEART CATH AND CORONARY ANGIOGRAPHY;  Surgeon: Runell Gess, MD;  Location: [facility] INVASIVE CV LAB;  Service: Cardiovascular;  Laterality: N/A;    FAMILY HISTORY Family History  Problem Relation Age of Onset  . Cancer Father   . Ataxia Neg Hx   . Chorea Neg Hx   . Dementia Neg Hx   . Mental retardation Neg Hx   . Migraines Neg Hx   . Multiple sclerosis Neg Hx   . Neurofibromatosis Neg Hx   . Neuropathy Neg Hx   . Parkinsonism Neg Hx   . Seizures Neg Hx   . Stroke Neg Hx     SOCIAL HISTORY Social History   Tobacco Use  . Smoking status: Never Smoker  . Smokeless tobacco: Never Used  Substance Use Topics  . Alcohol use: No  . Drug use: No         OPHTHALMIC EXAM:  Base Eye Exam    Visual Acuity (ETDRS)      Right Left   Dist cc 20/200 -1 20/400   Dist ph cc NI 20/200   Correction: Glasses       Tonometry (Tonopen, 1:48 PM)      Right Left   Pressure 14 14       Pupils      Pupils Dark Light Shape React APD   Right PERRL 5 4 Round  Brisk None   Left PERRL 5 4 Round Brisk None       Visual Fields (Counting fingers)      Left Right    Full Full       Extraocular  Movement      Right Left    Full Full       Neuro/Psych    Oriented x3: Yes   Mood/Affect: Normal       Dilation    Both eyes: 1.0% Mydriacyl, 2.5% Phenylephrine @ 1:48 PM        Slit Lamp and Fundus Exam    Slit Lamp Exam      Right Left   Lids/Lashes Normal Normal   Conjunctiva/Sclera White and quiet White and quiet   Cornea Clear Clear   Anterior Chamber Deep and quiet Deep and quiet   Iris Round and reactive Round and reactive   Lens 3+ Nuclear sclerosis 3+ Nuclear sclerosis   Anterior Vitreous Normal Normal       Fundus Exam      Right Left   Posterior Vitreous Posterior vitreous detachment Posterior vitreous detachment   Disc Normal Normal   C/D Ratio 0.25 0.25   Macula Geographic atrophy, Advanced age related macular degeneration, no exudates, no macular thickening, Retinal pigment epithelial mottling Macular thickening, Subretinal neovascular membrane   Vessels Normal Normal   Periphery Normal Normal          IMAGING AND PROCEDURES  Imaging and Procedures for 05/13/19  OCT, Retina - OU - Both Eyes       Right Eye Quality was good. Scan locations included subfoveal. Central Foveal Thickness: 301. Progression has been stable. Findings include outer retinal atrophy, central retinal atrophy, subretinal scarring, inner retinal atrophy, abnormal foveal contour.   Left Eye Quality was good. Scan locations included subfoveal. Central Foveal Thickness: 448. Progression has worsened. Findings include cystoid macular edema, disciform scar, central retinal atrophy, outer retinal atrophy, inner retinal atrophy, abnormal foveal contour, subretinal scarring.   Notes OD with chronic intraretinal CME, stable in the last 5 months.  No signs of active CN VM.    OS new onset CME , from active CN VM.  Will need to commence with  intravitreal Avastin today.         Intravitreal Injection, Pharmacologic Agent - OS - Left Eye       Time Out 05/13/2019. 2:32 PM. Confirmed correct patient, procedure, site, and patient consented.   Anesthesia Topical anesthesia was used. Anesthetic medications included Akten 3.5%.   Procedure Preparation included Tobramycin 0.3%, Ofloxacin , 10% betadine to eyelids. A 30 gauge needle was used.   Injection:  1.25 mg Bevacizumab (AVASTIN) SOLN   NDC: [address]-0, Lot: [id number]   Route: Intravitreal, Site: Left Eye, Waste: 0 mg  Post-op Post injection exam found visual acuity of at least counting fingers. The patient tolerated the procedure well. There were no complications. The patient received written and verbal post procedure care education. Post injection medications were not given.                 ASSESSMENT/PLAN:  Exudative age-related macular degeneration of left eye with active choroidal neovascularization (HCC) The nature of wet macular degeneration was discussed with the patient.  Forms of therapy reviewed include the use of Anti-VEGF medications injected painlessly into the eye, as well as other possible treatment modalities, including thermal laser therapy. Fellow eye involvement and risks were discussed with the patient. Upon the finding of wet age related macular degeneration, treatment will be offered. The treatment regimen is on a treat as needed basis with the intent to treat if necessary and extend interval of exams when possible. On average 1 out of 6 patients do not need  lifetime therapy. However, the risk of recurrent disease is high for a lifetime.  Initially monthly, then periodic, examinations and evaluations will determine whether the next treatment is required on the day of the examination.  OS new, active restart intravitreal Avastin OS today and examination in 5 to 6 weeks      ICD-10-CM   1. Exudative age-related macular degeneration of left eye  with active choroidal neovascularization (HCC)  H35.3221 OCT, Retina - OU - Both Eyes    Intravitreal Injection, Pharmacologic Agent - OS - Left Eye    Bevacizumab (AVASTIN) SOLN 1.25 mg  2. Exudative age-related macular degeneration of right eye with active choroidal neovascularization (HCC)  H35.3211   3. Primary open angle glaucoma of both eyes, mild stage  H40.1131 Intravitreal Injection, Pharmacologic Agent - OS - Left Eye    Bevacizumab (AVASTIN) SOLN 1.25 mg  4. 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 neovascularization (HCC)  H35.3212 Intravitreal Injection, Pharmacologic Agent - OS - Left Eye    Bevacizumab (AVASTIN) SOLN 1.25 mg  5. Nuclear sclerotic cataract of both eyes  H25.13     1.OD with chronic intraretinal CME, stable in the last 5 months.  No signs of active CN VM.    OS new onset CME , from active CN VM.  Will need to commence with intravitreal Avastin today.   2.  3.  Ophthalmic Meds Ordered this visit:  Meds ordered this encounter  Medications  . Bevacizumab (AVASTIN) SOLN 1.25 mg       Return in about 5 weeks (around 06/17/2019) for dilate, OS, AVASTIN OCT.  There are no Patient Instructions on file for this visit.   Explained the diagnoses, plan, and follow up with the patient and they expressed understanding.  Patient expressed understanding of the importance of proper follow up care.   Alford Highland Rankin M.D. Diseases & Surgery of the Retina and Vitreous [facility] 05/13/19     Abbreviations: M myopia (nearsighted); A astigmatism; H hyperopia (farsighted); P presbyopia; Mrx spectacle prescription;  CTL contact lenses; OD right eye; OS left eye; OU both eyes  XT exotropia; ET esotropia; PEK punctate epithelial keratitis; PEE punctate epithelial erosions; DES dry eye syndrome; MGD meibomian gland dysfunction; ATs artificial tears; PFAT's preservative free artificial tears; NSC nuclear sclerotic  cataract; PSC posterior subcapsular cataract; ERM epi-retinal membrane; PVD posterior vitreous detachment; RD retinal detachment; DM diabetes mellitus; DR diabetic retinopathy; NPDR non-proliferative diabetic retinopathy; PDR proliferative diabetic retinopathy; CSME clinically significant macular edema; DME diabetic macular edema; dbh dot blot hemorrhages; CWS cotton wool spot; POAG primary open angle glaucoma; C/D cup-to-disc ratio; HVF humphrey visual field; GVF goldmann visual field; OCT optical coherence tomography; IOP intraocular pressure; BRVO Branch retinal vein occlusion; CRVO central retinal vein occlusion; CRAO central retinal artery occlusion; BRAO branch retinal artery occlusion; RT retinal tear; SB scleral buckle; PPV pars plana vitrectomy; VH Vitreous hemorrhage; PRP panretinal laser photocoagulation; IVK intravitreal kenalog; VMT vitreomacular traction; MH Macular hole;  NVD neovascularization of the disc; NVE neovascularization elsewhere; AREDS age related eye disease study; ARMD age related macular degeneration; POAG primary open angle glaucoma; EBMD epithelial/anterior basement membrane dystrophy; ACIOL anterior chamber intraocular lens; IOL intraocular lens; PCIOL posterior chamber intraocular lens; Phaco/IOL phacoemulsification with intraocular lens placement; PRK photorefractive keratectomy; LASIK laser assisted in situ keratomileusis; HTN hypertension; DM diabetes mellitus; COPD chronic obstructive pulmonary disease

## 2019-05-13 NOTE — Telephone Encounter (Signed)
Pt daughter wanted to know if pt needed to be on anything for BP since stopping lisinopril - pt still taking carvedilol - did not know what current BP was - denies any symptoms - pt daughter aware to record BP over the next few days same time daily and an hour after taking medication and call office back with readings

## 2019-05-13 NOTE — Assessment & Plan Note (Signed)
The nature of wet macular degeneration was discussed with the patient.  Forms of therapy reviewed include the use of Anti-VEGF medications injected painlessly into the eye, as well as other possible treatment modalities, including thermal laser therapy. Fellow eye involvement and risks were discussed with the patient. Upon the finding of wet age related macular degeneration, treatment will be offered. The treatment regimen is on a treat as needed basis with the intent to treat if necessary and extend interval of exams when possible. On average 1 out of 6 patients do not need lifetime therapy. However, the risk of recurrent disease is high for a lifetime.  Initially monthly, then periodic, examinations and evaluations will determine whether the next treatment is required on the day of the examination.  OS new, active restart intravitreal Avastin OS today and examination in 5 to 6 weeks

## 2019-05-28 ENCOUNTER — Telehealth: Payer: Self-pay | Admitting: Cardiology

## 2019-05-28 NOTE — Telephone Encounter (Signed)
Daughter called asking what to do about her medication that controls her BP. Stated that it has been high and she has been experiencing headaches also. Her BP this morning 196/115

## 2019-05-29 ENCOUNTER — Telehealth: Payer: Self-pay

## 2019-05-29 MED ORDER — AMLODIPINE BESYLATE 5 MG PO TABS: 5.0000 mg | ORAL_TABLET | Freq: Every day | ORAL | 3 refills | Status: DC

## 2019-05-29 NOTE — Telephone Encounter (Signed)
Daughter feels Pt is having a reaction to medication  Please call [name] [phone]  Thanks renee

## 2019-05-29 NOTE — Telephone Encounter (Signed)
Lisinopril stopped after ER visit.  Let's go ahead and start Norvasc 5 mg daily.

## 2019-05-29 NOTE — Telephone Encounter (Signed)
Daughter called concerned over her mom's elevated blood pressures.Had to stop lisinopril due to facial swelling.Taking Coreg 25 mg BID. BP readings are 196/115, 158/83, 191/101, 161/83, 116/55. HR 60-70's

## 2019-05-29 NOTE — Telephone Encounter (Signed)
Daughter notified, will escribe norvasc to pharmacy

## 2019-06-07 DIAGNOSIS — M4156 Other secondary scoliosis, lumbar region: Secondary | ICD-10-CM | POA: Diagnosis not present

## 2019-06-17 ENCOUNTER — Encounter (INDEPENDENT_AMBULATORY_CARE_PROVIDER_SITE_OTHER): Payer: Medicare Other | Admitting: Ophthalmology

## 2019-06-18 DIAGNOSIS — R2242 Localized swelling, mass and lump, left lower limb: Secondary | ICD-10-CM | POA: Diagnosis not present

## 2019-06-18 DIAGNOSIS — R410 Disorientation, unspecified: Secondary | ICD-10-CM | POA: Diagnosis not present

## 2019-06-19 ENCOUNTER — Ambulatory Visit (INDEPENDENT_AMBULATORY_CARE_PROVIDER_SITE_OTHER): Payer: Medicare Other | Admitting: Ophthalmology

## 2019-06-19 ENCOUNTER — Encounter (INDEPENDENT_AMBULATORY_CARE_PROVIDER_SITE_OTHER): Payer: Self-pay | Admitting: Ophthalmology

## 2019-06-19 ENCOUNTER — Other Ambulatory Visit: Payer: Self-pay

## 2019-06-19 DIAGNOSIS — H353211 Exudative age-related macular degeneration, right eye, with active choroidal neovascularization: Secondary | ICD-10-CM

## 2019-06-19 DIAGNOSIS — H353221 Exudative age-related macular degeneration, left eye, with active choroidal neovascularization: Secondary | ICD-10-CM

## 2019-06-19 DIAGNOSIS — H2513 Age-related nuclear cataract, bilateral: Secondary | ICD-10-CM

## 2019-06-19 MED ORDER — BEVACIZUMAB CHEMO INJECTION 1.25MG/0.05ML SYRINGE FOR KALEIDOSCOPE
1.2500 mg | INTRAVITREAL | Status: AC | PRN
  Administered 2019-06-19: 1.25 mg via INTRAVITREAL

## 2019-06-19 NOTE — Progress Notes (Signed)
06/19/2019     CHIEF COMPLAINT Patient presents for Retina Follow Up   HISTORY OF PRESENT ILLNESS: Beverly [name] is a 86 y.o. female who presents to the clinic today for:   HPI    Retina Follow Up    Patient presents with  Wet AMD.  In left eye.  Severity is moderate.  Duration of 5 weeks.  Since onset it is stable.  I, the attending physician,  performed the HPI with the patient and updated documentation appropriately.          Comments    5 Week AMD f\u OS. Possible Avastin OS. Oct  Pt states vision is stable. Pt would like a suggestion for a Cataract surgeon       Last edited by [name] on 06/19/2019  2:38 PM. (History)      Referring physician: Benita Stabile, MD [address] [place],  [place] [address]  HISTORICAL INFORMATION:   Selected notes from the MEDICAL RECORD NUMBER    Lab Results  Component Value Date   HGBA1C 6.1 (H) 10/19/2012     CURRENT MEDICATIONS: Current Outpatient Medications (Ophthalmic Drugs)  Medication Sig  . latanoprost (XALATAN) 0.005 % ophthalmic solution   . Propylene Glycol (SYSTANE BALANCE OP) Place 1-2 drops into both eyes daily as needed (dry eyes).   No current facility-administered medications for this visit. (Ophthalmic Drugs)   Current Outpatient Medications (Other)  Medication Sig  . ALPRAZolam (XANAX) 0.5 MG tablet Take 0.5 mg by mouth 3 (three) times daily as needed for anxiety.  Marland Kitchen amLODipine (NORVASC) 5 MG tablet Take 1 tablet (5 mg total) by mouth daily.  Marland Kitchen aspirin 81 MG chewable tablet Chew 1 tablet (81 mg total) by mouth daily.  Marland Kitchen atorvastatin (LIPITOR) 80 MG tablet Take 1 tablet (80 mg total) by mouth daily at 6 PM.  . carvedilol (COREG) 12.5 MG tablet Take 25 mg by mouth 2 (two) times daily with a meal.  . celecoxib (CELEBREX) 200 MG capsule Take 200 mg by mouth daily.  . cholecalciferol (VITAMIN D) 1000 UNITS tablet Take 1,000 Units by mouth daily.   . fluticasone (FLONASE) 50 MCG/ACT nasal spray  Place 1 spray into both nostrils daily.   Marland Kitchen gabapentin (NEURONTIN) 300 MG capsule Take 300 mg by mouth daily.  Marland Kitchen HYDROcodone-acetaminophen (NORCO) 10-325 MG tablet Take 1 tablet by mouth every 6 (six) hours as needed.  . levETIRAcetam (KEPPRA) 500 MG tablet Take 500 mg by mouth 2 (two) times daily.  Marland Kitchen levofloxacin (LEVAQUIN) 500 MG tablet Take 1 tablet (500 mg total) by mouth daily.  . methocarbamol (ROBAXIN) 500 MG tablet Take 1 tablet by mouth daily.  . methocarbamol (ROBAXIN) 750 MG tablet Take 500 mg by mouth 2 (two) times daily.   . Multiple Vitamins-Minerals (PRESERVISION/LUTEIN PO) Take 1 tablet by mouth 2 (two) times daily.  . nitroGLYCERIN (NITROSTAT) 0.4 MG SL tablet Place 1 tablet (0.4 mg total) under the tongue every 5 (five) minutes x 3 doses as needed for chest pain.  . pantoprazole (PROTONIX) 40 MG tablet Take 40 mg by mouth daily.  . potassium chloride (K-DUR) 10 MEQ tablet Take 10 mEq by mouth daily.   . sertraline (ZOLOFT) 100 MG tablet Take 100 mg by mouth daily.   No current facility-administered medications for this visit. (Other)      REVIEW OF SYSTEMS:    ALLERGIES Allergies  Allergen Reactions  . Lisinopril Swelling  . Eggs Or Egg-Derived  Products Itching    Patient reports that she can eat eggs but does not tolerate egg derived products  . Morphine And Related     Headache  . Penicillins Itching    DID THE REACTION INVOLVE: Swelling of the face/tongue/throat, SOB, or low BP? No Sudden or severe rash/hives, skin peeling, or the inside of the mouth or nose? No Did it require medical treatment? No When did it last happen? Unknown If all above answers are "NO", may proceed with cephalosporin use.     PAST MEDICAL HISTORY Past Medical History:  Diagnosis Date  . Anxiety   . Arthritis   . COPD (chronic obstructive pulmonary disease) (HCC)   . Coronary artery disease    a. STEMI with cardiac arrest (polymorphic VT) s/p DES to RCA.  Marland Kitchen GERD  (gastroesophageal reflux disease)   . Headache(784.0)   . Hearing loss, central    Left ear  . Hematoma   . Hypertension   . Memory changes   . Mild aortic stenosis   . Mild carotid artery disease (HCC)    a. 1-39% by duplex 02/2018.  Marland Kitchen Seizures (HCC) 10/2012   No seizures, speech issues; after a fall, hitting head on left side   Past Surgical History:  Procedure Laterality Date  . ABDOMINAL HYSTERECTOMY    . APPENDECTOMY    . CARDIAC CATHETERIZATION     15 yrs ago  . CORONARY/GRAFT ACUTE MI REVASCULARIZATION N/A 01/10/2018   Procedure: Coronary/Graft Acute MI Revascularization;  Surgeon: Runell Gess, MD;  Location: [facility] INVASIVE CV LAB;  Service: Cardiovascular;  Laterality: N/A;  . CRANIOTOMY Left 10/30/2012   Procedure: CRANIOTOMY HEMATOMA EVACUATION SUBDURAL;  Surgeon: Maeola Harman, MD;  Location: [facility] NEURO ORS;  Service: Neurosurgery;  Laterality: Left;  Left Craniotomy for evacuation of subdural hematoma  . JOINT REPLACEMENT Bilateral    knees  . LEFT HEART CATH AND CORONARY ANGIOGRAPHY N/A 01/10/2018   Procedure: LEFT HEART CATH AND CORONARY ANGIOGRAPHY;  Surgeon: Runell Gess, MD;  Location: [facility] INVASIVE CV LAB;  Service: Cardiovascular;  Laterality: N/A;    FAMILY HISTORY Family History  Problem Relation Age of Onset  . Cancer Father   . Ataxia Neg Hx   . Chorea Neg Hx   . Dementia Neg Hx   . Mental retardation Neg Hx   . Migraines Neg Hx   . Multiple sclerosis Neg Hx   . Neurofibromatosis Neg Hx   . Neuropathy Neg Hx   . Parkinsonism Neg Hx   . Seizures Neg Hx   . Stroke Neg Hx     SOCIAL HISTORY Social History   Tobacco Use  . Smoking status: Never Smoker  . Smokeless tobacco: Never Used  Vaping Use  . Vaping Use: Never used  Substance Use Topics  . Alcohol use: No  . Drug use: No         OPHTHALMIC EXAM:  Base Eye Exam    Visual Acuity (Snellen - Linear)      Right Left   Dist cc 20/200 20/400   Dist ph cc NI 20/200       Tonometry  (Tonopen, 2:45 PM)      Right Left   Pressure 14 13       Pupils      Pupils Dark Light Shape React APD   Right PERRL 4 3 Round Brisk None   Left PERRL 4 3 Round Brisk None       Visual Fields (Counting fingers)  Left Right    Full Full       Neuro/Psych    Oriented x3: Yes   Mood/Affect: Normal       Dilation    Left eye: 1.0% Mydriacyl, 2.5% Phenylephrine @ 2:45 PM        Slit Lamp and Fundus Exam    External Exam      Right Left   External Normal Normal       Slit Lamp Exam      Right Left   Lids/Lashes Normal Normal   Conjunctiva/Sclera White and quiet White and quiet   Cornea Clear Clear   Anterior Chamber Deep and quiet Deep and quiet   Iris Round and reactive Round and reactive   Lens 3+ Nuclear sclerosis 3+ Nuclear sclerosis   Anterior Vitreous Normal Normal       Fundus Exam      Right Left   Posterior Vitreous  Posterior vitreous detachment   Disc  Normal   C/D Ratio  0.25   Macula  Macular thickening, Subretinal neovascular membrane   Vessels  Normal   Periphery  Normal          IMAGING AND PROCEDURES  Imaging and Procedures for 06/19/19  OCT, Retina - OU - Both Eyes       Right Eye Quality was good. Scan locations included subfoveal. Central Foveal Thickness: 322. Progression has been stable. Findings include abnormal foveal contour, intraretinal hyper-reflective material, subretinal scarring, outer retinal atrophy, central retinal atrophy.   Left Eye Central Foveal Thickness: 340. Progression has improved. Findings include cystoid macular edema, choroidal neovascular membrane.   Notes OS much less active disease, wet ARMD, status post Avastin 1 month previous.  We will repeat intravitreal Avastin OS today and examination in 5 weeks       Intravitreal Injection, Pharmacologic Agent - OS - Left Eye       Time Out 06/19/2019. 3:57 PM. Confirmed correct patient, procedure, site, and patient consented.   Anesthesia Topical  anesthesia was used. Anesthetic medications included Akten 3.5%.   Procedure Preparation included Ofloxacin , 10% betadine to eyelids. A 30 gauge needle was used.   Injection:  1.25 mg Bevacizumab (AVASTIN) SOLN   NDC: [address]-4, Lot: [id number]   Route: Intravitreal, Site: Left Eye, Waste: 0 mg  Post-op Post injection exam found visual acuity of at least counting fingers. The patient tolerated the procedure well. There were no complications. The patient received written and verbal post procedure care education. Post injection medications were not given.                 ASSESSMENT/PLAN:  Exudative age-related macular degeneration of left eye with active choroidal neovascularization (HCC) Macular findings OS of vastly improved 1 month status post restart of intravitreal Avastin.  Will repeat today      ICD-10-CM   1. Exudative age-related macular degeneration of left eye with active choroidal neovascularization (HCC)  H35.3221 OCT, Retina - OU - Both Eyes    Intravitreal Injection, Pharmacologic Agent - OS - Left Eye    Bevacizumab (AVASTIN) SOLN 1.25 mg  2. Exudative age-related macular degeneration of right eye with active choroidal neovascularization (HCC)  H35.3211     1.  Repeat intravitreal Avastin OS today and examination repeat in 5 weeks  2.  3.  Ophthalmic Meds Ordered this visit:  Meds ordered this encounter  Medications  . Bevacizumab (AVASTIN) SOLN 1.25 mg       Return in about 5  weeks (around 07/24/2019) for AVASTIN OCT, OS.  There are no Patient Instructions on file for this visit.   Explained the diagnoses, plan, and follow up with the patient and they expressed understanding.  Patient expressed understanding of the importance of proper follow up care.   Clent Demark Bristal Steffy M.D. Diseases & Surgery of the Retina and Vitreous [facility] [place] 06/19/19     Abbreviations: M myopia (nearsighted); A astigmatism; H hyperopia (farsighted); P  presbyopia; Mrx spectacle prescription;  CTL contact lenses; OD right eye; OS left eye; OU both eyes  XT exotropia; ET esotropia; PEK punctate epithelial keratitis; PEE punctate epithelial erosions; DES dry eye syndrome; MGD meibomian gland dysfunction; ATs artificial tears; PFAT's preservative free artificial tears; [place] nuclear sclerotic cataract; PSC posterior subcapsular cataract; ERM epi-retinal membrane; PVD posterior vitreous detachment; RD retinal detachment; DM diabetes mellitus; DR diabetic retinopathy; NPDR non-proliferative diabetic retinopathy; PDR proliferative diabetic retinopathy; CSME clinically significant macular edema; DME diabetic macular edema; dbh dot blot hemorrhages; CWS cotton wool spot; POAG primary open angle glaucoma; C/D cup-to-disc ratio; HVF humphrey visual field; GVF goldmann visual field; OCT optical coherence tomography; IOP intraocular pressure; BRVO Branch retinal vein occlusion; CRVO central retinal vein occlusion; CRAO central retinal artery occlusion; BRAO branch retinal artery occlusion; RT retinal tear; SB scleral buckle; PPV pars plana vitrectomy; VH Vitreous hemorrhage; PRP panretinal laser photocoagulation; IVK intravitreal kenalog; VMT vitreomacular traction; MH Macular hole;  NVD neovascularization of the disc; NVE neovascularization elsewhere; AREDS age related eye disease study; ARMD age related macular degeneration; POAG primary open angle glaucoma; EBMD epithelial/anterior basement membrane dystrophy; ACIOL anterior chamber intraocular lens; IOL intraocular lens; PCIOL posterior chamber intraocular lens; Phaco/IOL phacoemulsification with intraocular lens placement; [place] photorefractive keratectomy; LASIK laser assisted in situ keratomileusis; HTN hypertension; DM diabetes mellitus; COPD chronic obstructive pulmonary disease

## 2019-06-19 NOTE — Assessment & Plan Note (Signed)
Macular findings OS of vastly improved 1 month status post restart of intravitreal Avastin.  Will repeat today

## 2019-06-26 IMAGING — DX DG SHOULDER 2+V*R*
3 series · 3 of 3 positions shown · non-contrast
Comparison: None.

CLINICAL DATA: Chronic pain after trauma 1 year prior

EXAM:
RIGHT SHOULDER - 2+ VIEW

[shoulder grashey]
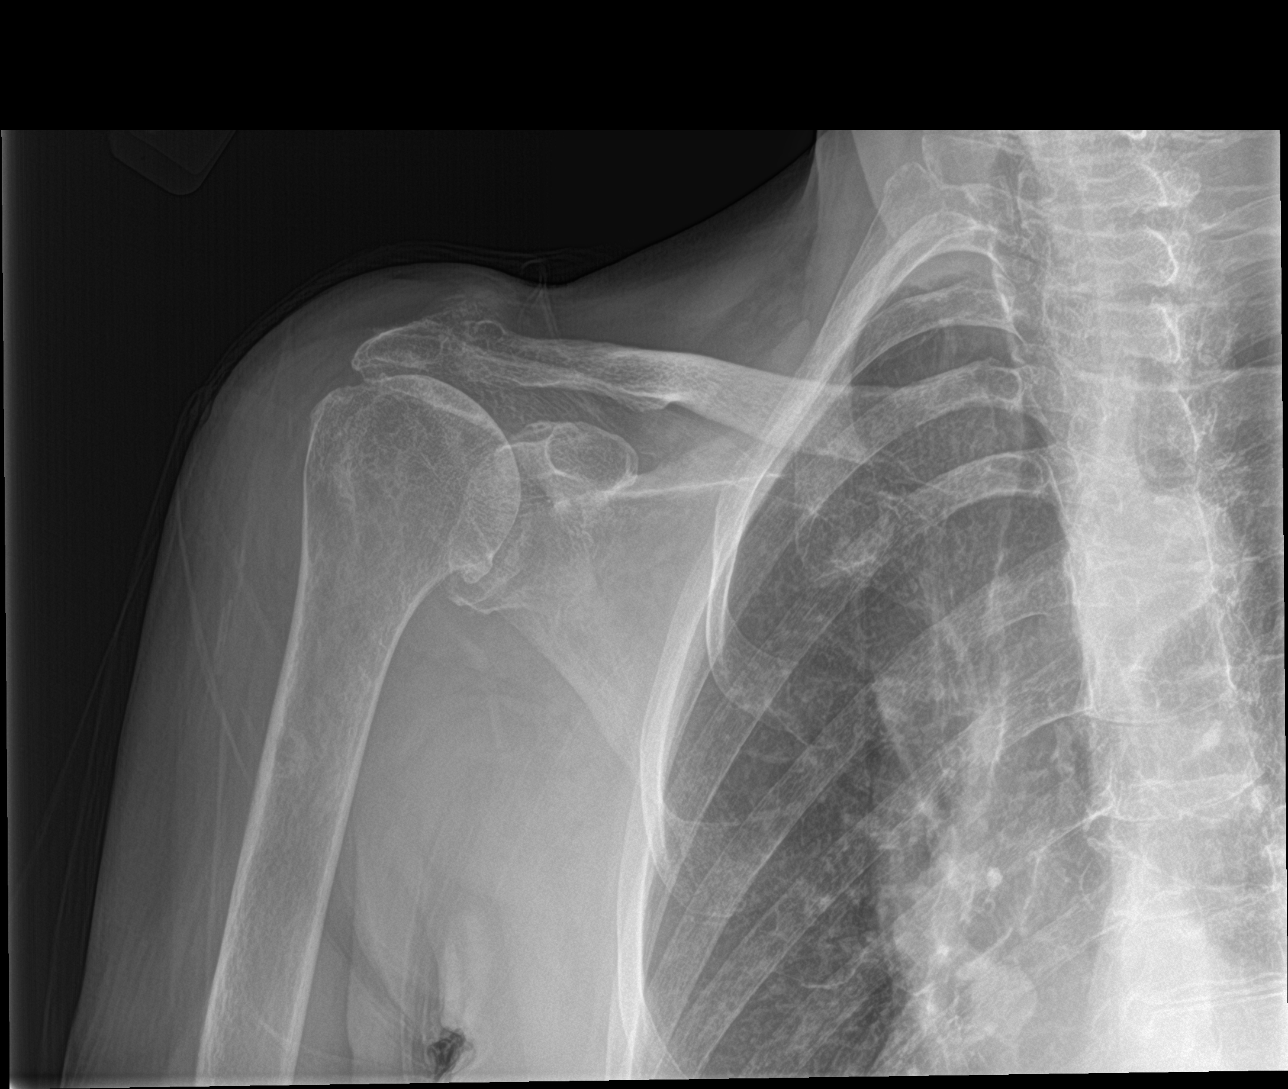

[shoulder y view]
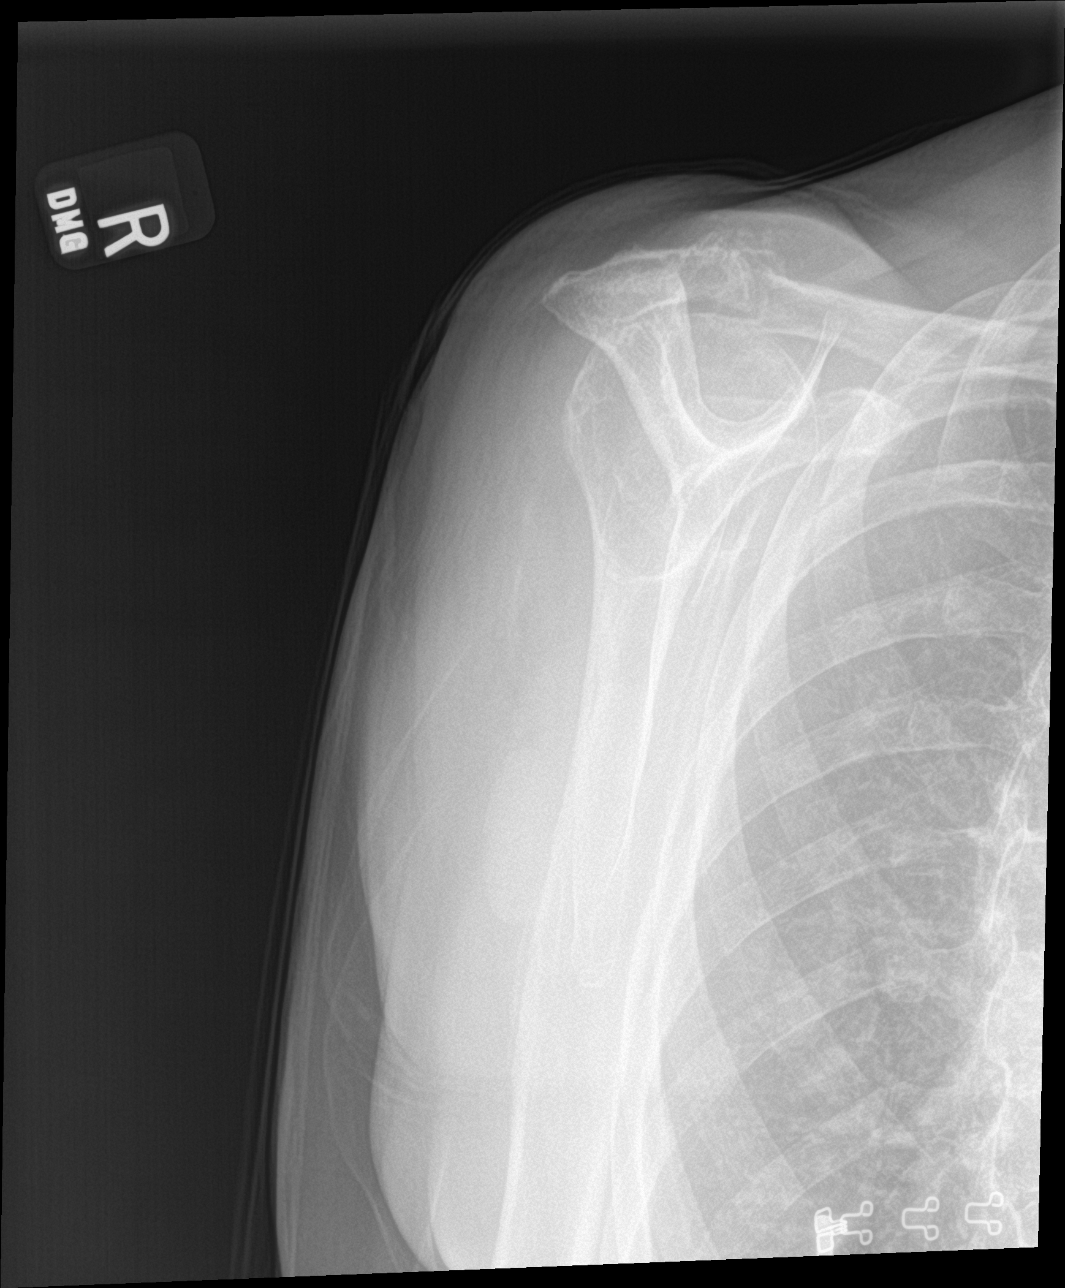

[shoulder axillary]
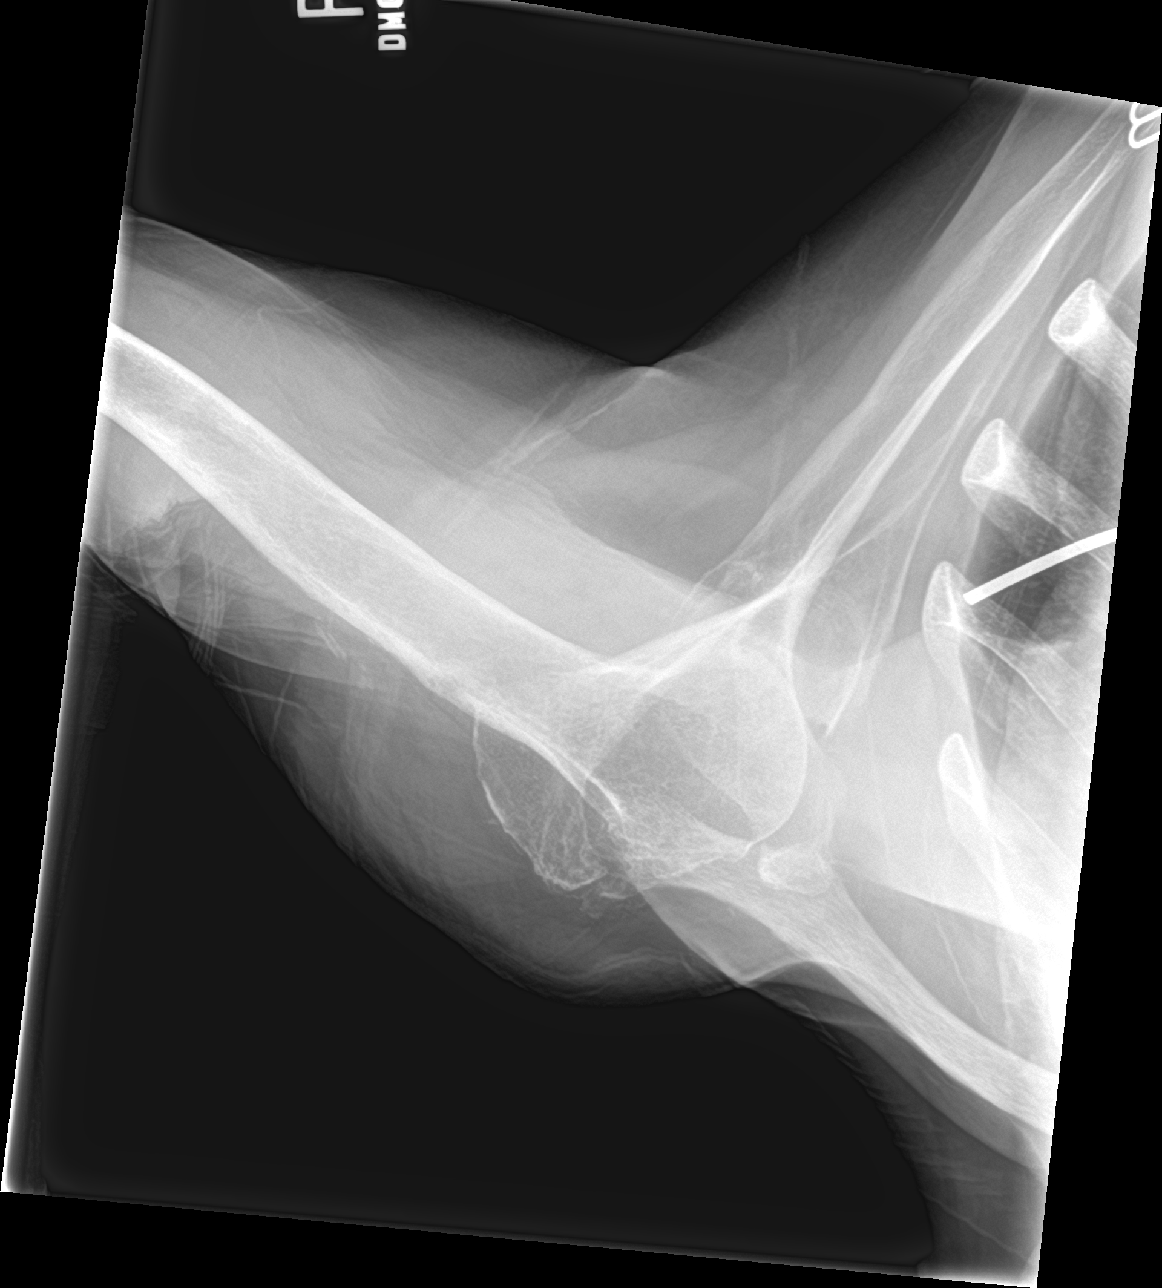

[3 of 3 positions shown; findings below may reference images not displayed]

FINDINGS: Frontal, Y scapular, and axillary images were obtained. Bones are
osteoporotic. No acute fracture or dislocation. There is extensive
osteoarthritic change diffusely. There is superior migration of the
humeral head. No erosive change. Visualized right lung is clear.
IMPRESSION: Diffuse generalized osteoarthritic change. No acute fracture or
dislocation. Superior migration of the right humeral head is
indicative chronic rotator cuff tear. Bones osteoporotic.

## 2019-07-02 ENCOUNTER — Other Ambulatory Visit (INDEPENDENT_AMBULATORY_CARE_PROVIDER_SITE_OTHER): Payer: Self-pay | Admitting: Ophthalmology

## 2019-07-03 DIAGNOSIS — Z79899 Other long term (current) drug therapy: Secondary | ICD-10-CM | POA: Diagnosis not present

## 2019-07-03 DIAGNOSIS — G894 Chronic pain syndrome: Secondary | ICD-10-CM | POA: Diagnosis not present

## 2019-07-03 DIAGNOSIS — G8929 Other chronic pain: Secondary | ICD-10-CM | POA: Diagnosis not present

## 2019-07-03 DIAGNOSIS — M545 Low back pain: Secondary | ICD-10-CM | POA: Diagnosis not present

## 2019-07-03 DIAGNOSIS — M5030 Other cervical disc degeneration, unspecified cervical region: Secondary | ICD-10-CM | POA: Diagnosis not present

## 2019-07-09 DIAGNOSIS — R410 Disorientation, unspecified: Secondary | ICD-10-CM | POA: Diagnosis not present

## 2019-07-19 ENCOUNTER — Other Ambulatory Visit: Payer: Self-pay | Admitting: Physician Assistant

## 2019-07-24 ENCOUNTER — Ambulatory Visit: Payer: Medicare Other | Admitting: Neurology

## 2019-07-24 ENCOUNTER — Encounter: Payer: Self-pay | Admitting: Neurology

## 2019-07-24 ENCOUNTER — Other Ambulatory Visit: Payer: Self-pay

## 2019-07-24 VITALS — BP 132/74 | HR 74 | Ht <= 58 in | Wt 112.3 lb

## 2019-07-24 DIAGNOSIS — R296 Repeated falls: Secondary | ICD-10-CM | POA: Diagnosis not present

## 2019-07-24 DIAGNOSIS — R441 Visual hallucinations: Secondary | ICD-10-CM | POA: Diagnosis not present

## 2019-07-24 DIAGNOSIS — Z79899 Other long term (current) drug therapy: Secondary | ICD-10-CM | POA: Diagnosis not present

## 2019-07-24 DIAGNOSIS — R440 Auditory hallucinations: Secondary | ICD-10-CM | POA: Diagnosis not present

## 2019-07-24 DIAGNOSIS — R413 Other amnesia: Secondary | ICD-10-CM

## 2019-07-24 DIAGNOSIS — Z8679 Personal history of other diseases of the circulatory system: Secondary | ICD-10-CM

## 2019-07-24 NOTE — Progress Notes (Signed)
Subjective:    Patient ID: Beverly [name] is a 86 y.o. female.  HPI     Beverly FoleySaima Selvin Yun, MD, PhD [facility] [address], [address] [address] [place], [place] [id number]  Dear Beverly [name],   I saw your patient, Beverly [name], upon your kind request in my neurologic clinic today for initial consultation of her memory loss.  The patient is accompanied by her daughter today.  As you know, Beverly [name] is a 86 year old right-handed woman with an underlying complex medical history of Subdural hematoma, Status post craniotomy in 2014, recurrent falls, seizure disorder on Keppra for which she has followed with Beverly [name], hypertension, carotid artery disease, aortic stenosis, hearing loss, reflux disease, COPD, arthritis, and anxiety, who reports Problems sleeping at night, chronic pain, occasionally seeing things and hearing things that are not there.  The patient is very hard of hearing.  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  Her history is primarily provided by her daughter.  The daughter reports that for the past 6+ months the patient has had visual hallucinations and also some delusions, being very convinced that what she sees is real.  She has also heard voices and noises that are not audible to her family.  Patient's daughter and daughter's husband live with her, patient lost her husband in June 2020.  Her son also stays with her during the day but works from home so he is not necessarily immediately around her. Of note, she is on multiple medications including high risk medications including a combination of narcotic pain medication and benzodiazepine medication.  [facility] and is on Norco 1 pill every 4 hours, around-the-clock and takes Xanax only if needed at this time.  She also takes Robaxin for back pain.  In addition, she is on sertraline and gabapentin, she also takes Keppra twice daily and has been on it for  over 5 years.  She has been on narcotic pain medication and benzodiazepine medication for many years.  Patient is a retired Engineer, civil (consulting)nurse.  Her mother may have had dementia, mom lived to be 4696 and her father lived to be 3263 and died from lung cancer.  Patient is a non-smoker and drinks very little caffeine, not a whole lot of water, some flavored water during the day.  She has fallen multiple times.  She has a 2 wheeled walker at the house and is currently situated in a wheelchair.  I reviewed your office note from 07/08/2016.  She has no been sleeping well, was recently on Seroquel which was stopped.  Her daughter was concerned about worsening symptoms at night including night terrors.  Patient has had hallucinations and delusions.  She has had recent falls.  She had a head CT and cervical spine CT without contrast on 05/03/2019 and I reviewed the results: IMPRESSION: 1. No acute intracranial pathology. Small-vessel white matter disease.   2.  Soft tissue contusion of the right frontal scalp.   3.  No fracture or static subluxation of the cervical spine.   4. Severe multilevel disc degenerative disease and osteophytosis of cervical spine notable for degenerative anterolisthesis of C3 on C4, unchanged compared to prior examination.  She has severe arthritis, she is status post bilateral knee replacement surgeries.  She also needs cataract surgeries.  She was also recently diagnosed with macular degeneration.  Her Past Medical History Is Significant For: Past Medical History:  Diagnosis  Date  . Anxiety   . Arthritis   . COPD (chronic obstructive pulmonary disease) (HCC)   . Coronary artery disease    a. STEMI with cardiac arrest (polymorphic VT) s/p DES to RCA.  Marland Kitchen GERD (gastroesophageal reflux disease)   . Headache(784.0)   . Hearing loss, central    Left ear  . Hematoma   . Hypertension   . Memory changes   . Mild aortic stenosis   . Mild carotid artery disease (HCC)    a. 1-39% by duplex  02/2018.  Marland Kitchen Seizures (HCC) 10/2012   No seizures, speech issues; after a fall, hitting head on left side    Her Past Surgical History Is Significant For: Past Surgical History:  Procedure Laterality Date  . ABDOMINAL HYSTERECTOMY    . APPENDECTOMY    . CARDIAC CATHETERIZATION     15 yrs ago  . CORONARY/GRAFT ACUTE MI REVASCULARIZATION N/A 01/10/2018   Procedure: Coronary/Graft Acute MI Revascularization;  Surgeon: Runell Gess, MD;  Location: [facility] INVASIVE CV LAB;  Service: Cardiovascular;  Laterality: N/A;  . CRANIOTOMY Left 10/30/2012   Procedure: CRANIOTOMY HEMATOMA EVACUATION SUBDURAL;  Surgeon: Maeola Harman, MD;  Location: [facility] NEURO ORS;  Service: Neurosurgery;  Laterality: Left;  Left Craniotomy for evacuation of subdural hematoma  . JOINT REPLACEMENT Bilateral    knees  . LEFT HEART CATH AND CORONARY ANGIOGRAPHY N/A 01/10/2018   Procedure: LEFT HEART CATH AND CORONARY ANGIOGRAPHY;  Surgeon: Runell Gess, MD;  Location: [facility] INVASIVE CV LAB;  Service: Cardiovascular;  Laterality: N/A;    Her Family History Is Significant For: Family History  Problem Relation Age of Onset  . Cancer Father   . Ataxia Neg Hx   . Chorea Neg Hx   . Dementia Neg Hx   . Mental retardation Neg Hx   . Migraines Neg Hx   . Multiple sclerosis Neg Hx   . Neurofibromatosis Neg Hx   . Neuropathy Neg Hx   . Parkinsonism Neg Hx   . Seizures Neg Hx   . Stroke Neg Hx     Her Social History Is Significant For: Social History   Socioeconomic History  . Marital status: Married    Spouse name: Not on file  . Number of children: Not on file  . Years of education: Not on file  . Highest education level: Not on file  Occupational History  . Not on file  Tobacco Use  . Smoking status: Never Smoker  . Smokeless tobacco: Never Used  Vaping Use  . Vaping Use: Never used  Substance and Sexual Activity  . Alcohol use: No  . Drug use: No  . Sexual activity: Not on file  Other Topics Concern  . Not  on file  Social History Narrative  . Not on file   Social Determinants of Health   Financial Resource Strain:   . Difficulty of Paying Living Expenses:   Food Insecurity:   . Worried About Programme researcher, broadcasting/film/video in the Last Year:   . Barista in the Last Year:   Transportation Needs:   . Freight forwarder (Medical):   Marland Kitchen Lack of Transportation (Non-Medical):   Physical Activity:   . Days of Exercise per Week:   . Minutes of Exercise per Session:   Stress:   . Feeling of Stress :   Social Connections:   . Frequency of Communication with Friends and Family:   . Frequency of Social Gatherings with Friends and  Family:   . Attends Religious Services:   . Active Member of Clubs or Organizations:   . Attends Banker Meetings:   Marland Kitchen Marital Status:     Her Allergies Are:  Allergies  Allergen Reactions  . Lisinopril Swelling  . Eggs Or Egg-Derived Products Itching    Patient reports that she can eat eggs but does not tolerate egg derived products  . Morphine And Related     Headache  . Penicillins Itching    DID THE REACTION INVOLVE: Swelling of the face/tongue/throat, SOB, or low BP? No Sudden or severe rash/hives, skin peeling, or the inside of the mouth or nose? No Did it require medical treatment? No When did it last happen? Unknown If all above answers are "NO", may proceed with cephalosporin use.   :   Her Current Medications Are:  Outpatient Encounter Medications as of 07/24/2019  Medication Sig  . ALPRAZolam (XANAX) 0.5 MG tablet Take 0.5 mg by mouth 3 (three) times daily as needed for anxiety.  Marland Kitchen amLODipine (NORVASC) 5 MG tablet Take 1 tablet (5 mg total) by mouth daily.  Marland Kitchen aspirin 81 MG chewable tablet Chew 1 tablet (81 mg total) by mouth daily.  Marland Kitchen atorvastatin (LIPITOR) 80 MG tablet Take 1 tablet (80 mg total) by mouth daily at 6 PM.  . carvedilol (COREG) 12.5 MG tablet Take 25 mg by mouth 2 (two) times daily with a meal.  . celecoxib  (CELEBREX) 200 MG capsule Take 200 mg by mouth daily.  . cholecalciferol (VITAMIN D) 1000 UNITS tablet Take 1,000 Units by mouth daily.   . fluticasone (FLONASE) 50 MCG/ACT nasal spray Place 1 spray into both nostrils daily.   Marland Kitchen gabapentin (NEURONTIN) 300 MG capsule Take 300 mg by mouth daily.  Marland Kitchen HYDROcodone-acetaminophen (NORCO) 10-325 MG tablet Take 1 tablet by mouth every 6 (six) hours as needed.  . latanoprost (XALATAN) 0.005 % ophthalmic solution INSTILL ONE DROP IN BOTH EYES NIGHTLY  . levETIRAcetam (KEPPRA) 500 MG tablet Take 500 mg by mouth 2 (two) times daily.  . methocarbamol (ROBAXIN) 500 MG tablet Take 1 tablet by mouth daily.  . methocarbamol (ROBAXIN) 750 MG tablet Take 500 mg by mouth 2 (two) times daily.   . Multiple Vitamins-Minerals (PRESERVISION/LUTEIN PO) Take 1 tablet by mouth 2 (two) times daily.  . nitroGLYCERIN (NITROSTAT) 0.4 MG SL tablet Place 1 tablet (0.4 mg total) under the tongue every 5 (five) minutes x 3 doses as needed for chest pain.  . pantoprazole (PROTONIX) 40 MG tablet Take 40 mg by mouth daily.  . potassium chloride (K-DUR) 10 MEQ tablet Take 10 mEq by mouth daily.   Marland Kitchen Propylene Glycol (SYSTANE BALANCE OP) Place 1-2 drops into both eyes daily as needed (dry eyes).  . sertraline (ZOLOFT) 100 MG tablet Take 100 mg by mouth daily.  . [DISCONTINUED] levofloxacin (LEVAQUIN) 500 MG tablet Take 1 tablet (500 mg total) by mouth daily.  . [DISCONTINUED] lisinopril (ZESTRIL) 20 MG tablet Take 1 tablet (20 mg total) by mouth 2 (two) times daily.   No facility-administered encounter medications on file as of 07/24/2019.  :   Review of Systems:  Out of a complete 14 point review of systems, all are reviewed and negative with the exception of these symptoms as listed below: Review of Systems  Neurological:       Here to discuss memory loss. Daughter reports pt has had hallucinations/delusions recently but thinks this is med related.     Objective:  Neurological  Exam  Physical Exam Physical Examination:   Vitals:   07/24/19 1052  BP: 132/74  Pulse: 74  SpO2: 94%   General Examination: The patient is a very pleasant 86 y.o. female in no acute distress. She appears frail, situated in her wheelchair, well-groomed.  HEENT: Normocephalic, atraumatic, pupils are equal, round and reactive to light and accommodation.  Evidence of cataracts, tracking is preserved, hearing is impaired, right-sided hearing aid in place.  Face is symmetric, no dysarthria noted, airway examination reveals moderate mouth dryness, tongue protrudes centrally and palate elevates symmetrically.    Chest: Clear to auscultation without wheezing, rhonchi or crackles noted.  Heart: S1+S2+0, regular and normal without murmurs, rubs or gallops noted.   Abdomen: Soft, non-tender and non-distended with normal bowel sounds appreciated on auscultation.  Extremities: There is 1+ pitting edema in the distal lower extremities bilaterally, particularly around both ankles and feet, left worse than right.  Pedal pulses are difficult to palpate.  Skin: Warm and dry without trophic changes noted. .  Musculoskeletal: exam reveals dominant arthritic changes in both hands.  She has mild knee swelling, right more than left, bilateral knee replacements.  Increased curvature noted in her back.  Neurologically:  Mental status: The patient is awake, pays good attention, memory testing is difficult secondary to her hearing impairment.    On 07/24/2019: MMSE: 26/30, CDT: 2/4, AFT: 8/min.   There is no evidence of aphasia, agnosia, apraxia or anomia. Speech is clear with normal prosody and enunciation. Thought process is linear. Mood is normal and affect is normal.  Cranial nerves II - XII are as described above under HEENT exam.   Motor exam: Thin muscle bulk, global strength of 4 out of 5, reflexes are 1+ in the upper extremities, absent in both knees and 1+ in the ankles bilaterally.   Fine motor  skills and coordination: Globally mildly impaired, no lateralization noted.    Cerebellar testing: No obvious intention tremor.    Sensory exam: intact to light touch in the upper and lower extremities.  Gait, station and balance: I did not have her stand or walk for me today.  She is in a wheelchair, no walker available, she has a history of frequent falls.  I did not deem her safe to stand or walk for me.   Assessment and Plan:   In summary, HILDUR BAYER is a very pleasant 86 y.o.-year old female with an underlying complex medical history of Subdural hematoma, Status post craniotomy in 2014, recurrent falls, seizure disorder on Keppra for which she has followed with Dr. Everlena Cooper, hypertension, carotid artery disease, aortic stenosis, hearing loss, reflux disease, COPD, arthritis, anxiety, and chronic pain, on chronic narcotic pain medication, who presents for evaluation of her memory loss.  She has had in the past several months intermittent visual and auditory hallucinations.  She does not sleep very well.  According to her daughter, she had significant side effects when she was tried on Seroquel.  Of note, she is already on multiple medications including several psychotropic medications and high risk medication including a combination of a narcotic pain medication and benzodiazepine medication.  I primarily concerned about her medication regimen, this can certainly be at least a contributor to her hallucinations and daytime somnolence.  She has had trouble sleeping at night.  I suggested a cautious trial of melatonin at night.  She is advised that she can be monitored for her memory by your office with close follow-up with her pain  management doctor and primary care.  She had recent CT scans.  I would like to pursue a brain MRI without contrast as well as EEG to rule out any structural or primary neurological cause of her hallucinations.  We will keep her daughter posted as to patient's test results who is  agreeable to pursuing these 2 tests through our office.  We will call with the test results and arrange for follow-up through this office if needed.  I answered all the questions today and the patient and her daughter were in agreement.   Thank you very much for allowing me to participate in the care of this nice patient. If I can be of any further assistance to you please do not hesitate to call me at [phone].  Sincerely,   Beverly Foley, MD, PhD

## 2019-07-24 NOTE — Patient Instructions (Addendum)
As discussed, I believe there are several factors that contribute to your memory issues and hallucinations including memory loss secondary to normal aging, vascular risk factors, high risk medication use, taking multiple sedative medications and medications that can cause hallucinations which includes your pain medication including the hydrocodone, gabapentin, and muscle relaxer.  The combination of a benzodiazepine medication and narcotic pain medication is considered high risk, particularly in the elderly.   Please continue close follow-up with your primary care physician and nurse practitioner as well as your pain management doctor.  Your memory can be monitored.  I would not recommend any new medication from my end of things.  If anything, streamlining your medication regimen may help.  Please try to hydrate well with water, 6 to 8 cups/day are recommended, generally speaking, 8 ounce each.   You can try Melatonin at night for sleep: take 1 mg to 3 mg, one to 2 hours before your bedtime. You can go up to 5 mg if needed. It is over the counter and comes in pill form, chewable form and spray, if you prefer.    I would like to proceed with a brain MRI and we will do an EEG (brainwave test), which we will schedule. We will call you with the results.  For now, we will keep you posted as to your test results by phone call and take it from there.

## 2019-07-25 ENCOUNTER — Encounter (INDEPENDENT_AMBULATORY_CARE_PROVIDER_SITE_OTHER): Payer: Medicare Other | Admitting: Ophthalmology

## 2019-07-27 ENCOUNTER — Telehealth: Payer: Self-pay | Admitting: Neurology

## 2019-07-27 NOTE — Telephone Encounter (Signed)
[employer] medicare order sent to [facility]. No auth they will reach out to the patient to schedule.  

## 2019-07-30 ENCOUNTER — Ambulatory Visit (INDEPENDENT_AMBULATORY_CARE_PROVIDER_SITE_OTHER): Payer: Medicare Other | Admitting: Ophthalmology

## 2019-07-30 ENCOUNTER — Other Ambulatory Visit: Payer: Self-pay

## 2019-07-30 ENCOUNTER — Encounter (INDEPENDENT_AMBULATORY_CARE_PROVIDER_SITE_OTHER): Payer: Self-pay | Admitting: Ophthalmology

## 2019-07-30 DIAGNOSIS — H353221 Exudative age-related macular degeneration, left eye, with active choroidal neovascularization: Secondary | ICD-10-CM

## 2019-07-30 DIAGNOSIS — H353211 Exudative age-related macular degeneration, right eye, with active choroidal neovascularization: Secondary | ICD-10-CM | POA: Diagnosis not present

## 2019-07-30 MED ORDER — BEVACIZUMAB CHEMO INJECTION 1.25MG/0.05ML SYRINGE FOR KALEIDOSCOPE
1.2500 mg | INTRAVITREAL | Status: AC | PRN
  Administered 2019-07-30: 1.25 mg via INTRAVITREAL

## 2019-07-30 NOTE — Assessment & Plan Note (Signed)
Continue to observe, not active today

## 2019-07-30 NOTE — Assessment & Plan Note (Signed)
Much less active, on intravitreal Avastin currently at 5-week interval will repeat injection today and exam in 6 weeks

## 2019-07-30 NOTE — Progress Notes (Signed)
07/30/2019     CHIEF COMPLAINT Patient presents for Retina Follow Up   HISTORY OF PRESENT ILLNESS: Beverly [name] is a 86 y.o. female who presents to the clinic today for:   HPI    Retina Follow Up    Patient presents with  Wet AMD.  In left eye.  Duration of 6 weeks.  Since onset it is stable.          Comments    6 week follow up - OCT OU, poss Avastin OS Patient denies change in vision and overall has no complaints.        Last edited by Berenice Bouton on 07/30/2019  3:23 PM. (History)      Referring physician: Benita Stabile, MD [address] [place],  [place] [address]  HISTORICAL INFORMATION:   Selected notes from the MEDICAL RECORD NUMBER    Lab Results  Component Value Date   HGBA1C 6.1 (H) 10/19/2012     CURRENT MEDICATIONS: Current Outpatient Medications (Ophthalmic Drugs)  Medication Sig  . latanoprost (XALATAN) 0.005 % ophthalmic solution INSTILL ONE DROP IN BOTH EYES NIGHTLY  . Propylene Glycol (SYSTANE BALANCE OP) Place 1-2 drops into both eyes daily as needed (dry eyes).   No current facility-administered medications for this visit. (Ophthalmic Drugs)   Current Outpatient Medications (Other)  Medication Sig  . ALPRAZolam (XANAX) 0.5 MG tablet Take 0.5 mg by mouth 3 (three) times daily as needed for anxiety.  Marland Kitchen amLODipine (NORVASC) 5 MG tablet Take 1 tablet (5 mg total) by mouth daily.  Marland Kitchen aspirin 81 MG chewable tablet Chew 1 tablet (81 mg total) by mouth daily.  Marland Kitchen atorvastatin (LIPITOR) 80 MG tablet Take 1 tablet (80 mg total) by mouth daily at 6 PM.  . carvedilol (COREG) 12.5 MG tablet Take 25 mg by mouth 2 (two) times daily with a meal.  . celecoxib (CELEBREX) 200 MG capsule Take 200 mg by mouth daily.  . cholecalciferol (VITAMIN D) 1000 UNITS tablet Take 1,000 Units by mouth daily.   . fluticasone (FLONASE) 50 MCG/ACT nasal spray Place 1 spray into both nostrils daily.   Marland Kitchen gabapentin (NEURONTIN) 300 MG capsule Take 300 mg by mouth daily.  Marland Kitchen  HYDROcodone-acetaminophen (NORCO) 10-325 MG tablet Take 1 tablet by mouth every 6 (six) hours as needed.  . levETIRAcetam (KEPPRA) 500 MG tablet Take 500 mg by mouth 2 (two) times daily.  . methocarbamol (ROBAXIN) 500 MG tablet Take 1 tablet by mouth daily.  . methocarbamol (ROBAXIN) 750 MG tablet Take 500 mg by mouth 2 (two) times daily.   . Multiple Vitamins-Minerals (PRESERVISION/LUTEIN PO) Take 1 tablet by mouth 2 (two) times daily.  . nitroGLYCERIN (NITROSTAT) 0.4 MG SL tablet Place 1 tablet (0.4 mg total) under the tongue every 5 (five) minutes x 3 doses as needed for chest pain.  . pantoprazole (PROTONIX) 40 MG tablet Take 40 mg by mouth daily.  . potassium chloride (K-DUR) 10 MEQ tablet Take 10 mEq by mouth daily.   . sertraline (ZOLOFT) 100 MG tablet Take 100 mg by mouth daily.   No current facility-administered medications for this visit. (Other)      REVIEW OF SYSTEMS:    ALLERGIES Allergies  Allergen Reactions  . Lisinopril Swelling  . Eggs Or Egg-Derived Products Itching    Patient reports that she can eat eggs but does not tolerate egg derived products  . Morphine And Related     Headache  . Penicillins Itching  DID THE REACTION INVOLVE: Swelling of the face/tongue/throat, SOB, or low BP? No Sudden or severe rash/hives, skin peeling, or the inside of the mouth or nose? No Did it require medical treatment? No When did it last happen? Unknown If all above answers are "NO", may proceed with cephalosporin use.     PAST MEDICAL HISTORY Past Medical History:  Diagnosis Date  . Anxiety   . Arthritis   . COPD (chronic obstructive pulmonary disease) (HCC)   . Coronary artery disease    a. STEMI with cardiac arrest (polymorphic VT) s/p DES to RCA.  Marland Kitchen GERD (gastroesophageal reflux disease)   . Headache(784.0)   . Hearing loss, central    Left ear  . Hematoma   . Hypertension   . Memory changes   . Mild aortic stenosis   . Mild carotid artery disease (HCC)     a. 1-39% by duplex 02/2018.  Marland Kitchen Seizures (HCC) 10/2012   No seizures, speech issues; after a fall, hitting head on left side   Past Surgical History:  Procedure Laterality Date  . ABDOMINAL HYSTERECTOMY    . APPENDECTOMY    . CARDIAC CATHETERIZATION     15 yrs ago  . CORONARY/GRAFT ACUTE MI REVASCULARIZATION N/A 01/10/2018   Procedure: Coronary/Graft Acute MI Revascularization;  Surgeon: Runell Gess, MD;  Location: [facility] INVASIVE CV LAB;  Service: Cardiovascular;  Laterality: N/A;  . CRANIOTOMY Left 10/30/2012   Procedure: CRANIOTOMY HEMATOMA EVACUATION SUBDURAL;  Surgeon: Maeola Harman, MD;  Location: [facility] NEURO ORS;  Service: Neurosurgery;  Laterality: Left;  Left Craniotomy for evacuation of subdural hematoma  . JOINT REPLACEMENT Bilateral    knees  . LEFT HEART CATH AND CORONARY ANGIOGRAPHY N/A 01/10/2018   Procedure: LEFT HEART CATH AND CORONARY ANGIOGRAPHY;  Surgeon: Runell Gess, MD;  Location: [facility] INVASIVE CV LAB;  Service: Cardiovascular;  Laterality: N/A;    FAMILY HISTORY Family History  Problem Relation Age of Onset  . Cancer Father   . Ataxia Neg Hx   . Chorea Neg Hx   . Dementia Neg Hx   . Mental retardation Neg Hx   . Migraines Neg Hx   . Multiple sclerosis Neg Hx   . Neurofibromatosis Neg Hx   . Neuropathy Neg Hx   . Parkinsonism Neg Hx   . Seizures Neg Hx   . Stroke Neg Hx     SOCIAL HISTORY Social History   Tobacco Use  . Smoking status: Never Smoker  . Smokeless tobacco: Never Used  Vaping Use  . Vaping Use: Never used  Substance Use Topics  . Alcohol use: No  . Drug use: No         OPHTHALMIC EXAM:  Base Eye Exam    Visual Acuity (Snellen - Linear)      Right Left   Dist cc 20/200-1 20/200+1   Dist ph cc 20/100-2 NI   Correction: Glasses       Tonometry (Tonopen, 3:33 PM)      Right Left   Pressure 17 15       Pupils      Pupils Dark Light Shape React APD   Right PERRL 4 3 Round Brisk None   Left PERRL 4 3 Round Brisk None        Visual Fields (Counting fingers)      Left Right    Full Full       Extraocular Movement      Right Left    Full Full  Neuro/Psych    Oriented x3: Yes   Mood/Affect: Normal       Dilation    Left eye: 1.0% Mydriacyl, 2.5% Phenylephrine @ 3:33 PM        Slit Lamp and Fundus Exam    External Exam      Right Left   External Normal Normal       Slit Lamp Exam      Right Left   Lids/Lashes Normal Normal   Conjunctiva/Sclera White and quiet White and quiet   Cornea Clear Clear   Anterior Chamber Deep and quiet Deep and quiet   Iris Round and reactive Round and reactive   Anterior Vitreous Normal Normal       Fundus Exam      Right Left   Disc  Normal   C/D Ratio  0.3   Macula  Soft drusen, Macular thickening, Pigmented atrophy   Vessels  Normal   Periphery  Normal          IMAGING AND PROCEDURES  Imaging and Procedures for 07/30/19  OCT, Retina - OU - Both Eyes       Right Eye Quality was good. Scan locations included subfoveal. Central Foveal Thickness: 393. Findings include abnormal foveal contour, retinal drusen , cystoid macular edema, epiretinal membrane.   Left Eye Quality was good. Scan locations included subfoveal. Central Foveal Thickness: 328. Findings include subretinal scarring, intraretinal fluid, outer retinal atrophy.   Notes OD with stable findings.   OS with outer retinal scarring, revision yet the CME on portion of this is controlled currently at 5-week interval.  Repeat today and examination in 6 weeks                   ASSESSMENT/PLAN:  Exudative age-related macular degeneration of right eye with active choroidal neovascularization (HCC) Continue to observe, not active today  Exudative age-related macular degeneration of left eye with active choroidal neovascularization (HCC) Much less active, on intravitreal Avastin currently at 5-week interval will repeat injection today and exam in 6 weeks      ICD-10-CM     1. Exudative age-related macular degeneration of left eye with active choroidal neovascularization (HCC)  H35.3221 OCT, Retina - OU - Both Eyes  2. Exudative age-related macular degeneration of right eye with active choroidal neovascularization (HCC)  H35.3211     1.  2.  3.  Ophthalmic Meds Ordered this visit:  No orders of the defined types were placed in this encounter.      Return in about 6 weeks (around 09/10/2019) for dilate, OS, AVASTIN OCT.  There are no Patient Instructions on file for this visit.   Explained the diagnoses, plan, and follow up with the patient and they expressed understanding.  Patient expressed understanding of the importance of proper follow up care.   Alford Highland Victor Granados M.D. Diseases & Surgery of the Retina and Vitreous [facility] 07/30/19     Abbreviations: M myopia (nearsighted); A astigmatism; H hyperopia (farsighted); P presbyopia; Mrx spectacle prescription;  CTL contact lenses; OD right eye; OS left eye; OU both eyes  XT exotropia; ET esotropia; PEK punctate epithelial keratitis; PEE punctate epithelial erosions; DES dry eye syndrome; MGD meibomian gland dysfunction; ATs artificial tears; PFAT's preservative free artificial tears; NSC nuclear sclerotic cataract; PSC posterior subcapsular cataract; ERM epi-retinal membrane; PVD posterior vitreous detachment; RD retinal detachment; DM diabetes mellitus; DR diabetic retinopathy; NPDR non-proliferative diabetic retinopathy; PDR proliferative diabetic retinopathy; CSME clinically significant macular  edema; DME diabetic macular edema; dbh dot blot hemorrhages; CWS cotton wool spot; POAG primary open angle glaucoma; C/D cup-to-disc ratio; HVF humphrey visual field; GVF goldmann visual field; OCT optical coherence tomography; IOP intraocular pressure; BRVO Branch retinal vein occlusion; CRVO central retinal vein occlusion; CRAO central retinal artery occlusion; BRAO branch retinal artery  occlusion; RT retinal tear; SB scleral buckle; PPV pars plana vitrectomy; VH Vitreous hemorrhage; PRP panretinal laser photocoagulation; IVK intravitreal kenalog; VMT vitreomacular traction; MH Macular hole;  NVD neovascularization of the disc; NVE neovascularization elsewhere; AREDS age related eye disease study; ARMD age related macular degeneration; POAG primary open angle glaucoma; EBMD epithelial/anterior basement membrane dystrophy; ACIOL anterior chamber intraocular lens; IOL intraocular lens; PCIOL posterior chamber intraocular lens; Phaco/IOL phacoemulsification with intraocular lens placement; PRK photorefractive keratectomy; LASIK laser assisted in situ keratomileusis; HTN hypertension; DM diabetes mellitus; COPD chronic obstructive pulmonary disease

## 2019-08-18 ENCOUNTER — Other Ambulatory Visit: Payer: Self-pay

## 2019-08-18 ENCOUNTER — Ambulatory Visit
Admission: RE | Admit: 2019-08-18 | Discharge: 2019-08-18 | Disposition: A | Discharge status: Home / Self Care | Payer: Medicare Other | Source: Ambulatory Visit | Attending: Neurology | Admitting: Neurology

## 2019-08-18 DIAGNOSIS — Z8679 Personal history of other diseases of the circulatory system: Secondary | ICD-10-CM

## 2019-08-18 DIAGNOSIS — R440 Auditory hallucinations: Secondary | ICD-10-CM

## 2019-08-18 DIAGNOSIS — R441 Visual hallucinations: Secondary | ICD-10-CM

## 2019-08-18 DIAGNOSIS — R296 Repeated falls: Secondary | ICD-10-CM

## 2019-08-18 DIAGNOSIS — R413 Other amnesia: Secondary | ICD-10-CM

## 2019-08-18 DIAGNOSIS — Z79899 Other long term (current) drug therapy: Secondary | ICD-10-CM

## 2019-08-19 ENCOUNTER — Telehealth: Payer: Self-pay

## 2019-08-19 NOTE — Progress Notes (Signed)
Please call patient or her daughter regarding her brain MRI without contrast from 08/18/2019.  It showed no acute findings, chronic changes were seen including mild volume loss, which we call atrophy, it is considered mild in her case and chronic age-related hardening of the arteries was seen.  Overall no acute findings and no obvious cause for her hallucinations.

## 2019-08-19 NOTE — Telephone Encounter (Signed)
Pt's daughter notified of results and verbalized understanding and had no questions or concerns at the time of the call.

## 2019-08-19 NOTE — Telephone Encounter (Signed)
-----   Message from Beverly Foley, MD sent at 08/19/2019  9:45 AM EDT ----- Please call patient or her daughter regarding her brain MRI without contrast from 08/18/2019.  It showed no acute findings, chronic changes were seen including mild volume loss, which we call atrophy, it is considered mild in her case and chronic age-related hardening of the arteries was seen.  Overall no acute findings and no obvious cause for her hallucinations.

## 2019-08-26 ENCOUNTER — Ambulatory Visit: Payer: Medicare Other | Admitting: Neurology

## 2019-08-26 DIAGNOSIS — R410 Disorientation, unspecified: Secondary | ICD-10-CM | POA: Diagnosis not present

## 2019-08-26 DIAGNOSIS — R296 Repeated falls: Secondary | ICD-10-CM

## 2019-08-26 DIAGNOSIS — R413 Other amnesia: Secondary | ICD-10-CM

## 2019-08-26 DIAGNOSIS — R441 Visual hallucinations: Secondary | ICD-10-CM

## 2019-08-26 DIAGNOSIS — Z79899 Other long term (current) drug therapy: Secondary | ICD-10-CM

## 2019-08-26 DIAGNOSIS — Z8679 Personal history of other diseases of the circulatory system: Secondary | ICD-10-CM

## 2019-08-26 DIAGNOSIS — R440 Auditory hallucinations: Secondary | ICD-10-CM

## 2019-09-02 DIAGNOSIS — G8929 Other chronic pain: Secondary | ICD-10-CM | POA: Diagnosis not present

## 2019-09-02 DIAGNOSIS — M545 Low back pain: Secondary | ICD-10-CM | POA: Diagnosis not present

## 2019-09-02 DIAGNOSIS — G894 Chronic pain syndrome: Secondary | ICD-10-CM | POA: Diagnosis not present

## 2019-09-02 DIAGNOSIS — Z79899 Other long term (current) drug therapy: Secondary | ICD-10-CM | POA: Diagnosis not present

## 2019-09-05 NOTE — Procedures (Signed)
   HISTORY: 86 year old female, presented with memory loss, history of subdural hematoma, craniotomy in 2014, seizure disorder,  TECHNIQUE:  This is a routine 16 channel EEG recording with one channel devoted to a limited EKG recording.  It was performed during wakefulness, drowsiness and asleep.  Hyperventilation and photic stimulation were performed as activating procedures.  There are minimum muscle and movement artifact noted.  Upon maximum arousal, posterior dominant waking rhythm consistent of mildly dysrhythmic theta range activity 6 to 7 Hz, there was frequent appearance of C3 sharp transient, most noticeable following hyperventilation and photic stimulation. Hyperventilation produced mild/moderate buildup with higher amplitude and the slower activities noted.  Photic stimulation did not alter the tracing.  During EEG recording, patient developed drowsiness and no deeper stage of sleep was achieved  EKG demonstrate sinus rhythm, with heart rate of 68 bpm  CONCLUSION: This is a mild abnormal awake EEG.  There is evidence of mild background slowing, consistent with mild bihemispheric malfunction, in addition, there is evidence of left parietal region irritability.  Levert Feinstein, M.D. Ph.D.  [facility] [address] [place], [place] [address] Phone: [phone] Fax:      [phone]

## 2019-09-05 NOTE — Progress Notes (Signed)
Please advise patient or her daughter that her EEG did not show any acute or serious findings, mild abnormalities were seen including mild slowness of her brain activity and also some irritability from the left side of the brain which would be in keeping with her history of subdural hematoma and L brain surgery and history of seizures. I would recommend that she continue with her current seizure medicine at the current dose, which has been Keppra.  No obvious cause for her hallucinations.

## 2019-09-06 ENCOUNTER — Telehealth: Payer: Self-pay

## 2019-09-06 NOTE — Telephone Encounter (Signed)
Pt's daughter returned my call and we discussed the results. She verbalized understanding and had no questions/concerns at the time of call. She report since her last visit the pt has had some improvement in her hallucinations

## 2019-09-06 NOTE — Telephone Encounter (Signed)
I called pt. No answer, left a message asking pt to call me back.   

## 2019-09-06 NOTE — Telephone Encounter (Signed)
-----   Message from Huston Foley, MD sent at 09/05/2019  5:46 PM EDT ----- Please advise patient or her daughter that her EEG did not show any acute or serious findings, mild abnormalities were seen including mild slowness of her brain activity and also some irritability from the left side of the brain which would be in keeping with her history of subdural hematoma and L brain surgery and history of seizures. I would recommend that she continue with her current seizure medicine at the current dose, which has been Keppra.  No obvious cause for her hallucinations.

## 2019-09-10 ENCOUNTER — Encounter (INDEPENDENT_AMBULATORY_CARE_PROVIDER_SITE_OTHER): Payer: Medicare Other | Admitting: Ophthalmology

## 2019-09-19 ENCOUNTER — Encounter (INDEPENDENT_AMBULATORY_CARE_PROVIDER_SITE_OTHER): Payer: Self-pay | Admitting: Ophthalmology

## 2019-09-19 ENCOUNTER — Ambulatory Visit (INDEPENDENT_AMBULATORY_CARE_PROVIDER_SITE_OTHER): Payer: Medicare Other | Admitting: Ophthalmology

## 2019-09-19 ENCOUNTER — Other Ambulatory Visit: Payer: Self-pay

## 2019-09-19 DIAGNOSIS — H2513 Age-related nuclear cataract, bilateral: Secondary | ICD-10-CM | POA: Diagnosis not present

## 2019-09-19 DIAGNOSIS — H353221 Exudative age-related macular degeneration, left eye, with active choroidal neovascularization: Secondary | ICD-10-CM

## 2019-09-19 NOTE — Patient Instructions (Signed)
Patient encouraged to notify the office promptly if new onset visual acuity distortion or decline

## 2019-09-19 NOTE — Progress Notes (Signed)
09/19/2019     CHIEF COMPLAINT Patient presents for Retina Follow Up   HISTORY OF PRESENT ILLNESS: Beverly [name] is a 86 y.o. female who presents to the clinic today for:   HPI    Retina Follow Up    Patient presents with  Wet AMD.  In left eye.  Severity is moderate.  Duration of 7 weeks.  Since onset it is stable.  I, the attending physician,  performed the HPI with the patient and updated documentation appropriately.          Comments    7 Week Wet AMD f\u OS. Possible Avastin OS. OCT  Pt states the colors in OS look different than the colors in OD. Pt states she also sees zigzag lines.       Last edited by [name] on 09/19/2019  2:44 PM. (History)      Referring physician: Benita Stabile, MD [address] [place],  [place] [address]  HISTORICAL INFORMATION:   Selected notes from the MEDICAL RECORD NUMBER    Lab Results  Component Value Date   HGBA1C 6.1 (H) 10/19/2012     CURRENT MEDICATIONS: Current Outpatient Medications (Ophthalmic Drugs)  Medication Sig  . latanoprost (XALATAN) 0.005 % ophthalmic solution INSTILL ONE DROP IN BOTH EYES NIGHTLY  . Propylene Glycol (SYSTANE BALANCE OP) Place 1-2 drops into both eyes daily as needed (dry eyes).   No current facility-administered medications for this visit. (Ophthalmic Drugs)   Current Outpatient Medications (Other)  Medication Sig  . ALPRAZolam (XANAX) 0.5 MG tablet Take 0.5 mg by mouth 3 (three) times daily as needed for anxiety.  Marland Kitchen amLODipine (NORVASC) 5 MG tablet Take 1 tablet (5 mg total) by mouth daily.  Marland Kitchen aspirin 81 MG chewable tablet Chew 1 tablet (81 mg total) by mouth daily.  Marland Kitchen atorvastatin (LIPITOR) 80 MG tablet Take 1 tablet (80 mg total) by mouth daily at 6 PM.  . carvedilol (COREG) 12.5 MG tablet Take 25 mg by mouth 2 (two) times daily with a meal.  . celecoxib (CELEBREX) 200 MG capsule Take 200 mg by mouth daily.  . cholecalciferol (VITAMIN D) 1000 UNITS tablet Take 1,000 Units by  mouth daily.   . fluticasone (FLONASE) 50 MCG/ACT nasal spray Place 1 spray into both nostrils daily.   Marland Kitchen gabapentin (NEURONTIN) 300 MG capsule Take 300 mg by mouth daily.  Marland Kitchen HYDROcodone-acetaminophen (NORCO) 10-325 MG tablet Take 1 tablet by mouth every 6 (six) hours as needed.  . levETIRAcetam (KEPPRA) 500 MG tablet Take 500 mg by mouth 2 (two) times daily.  . methocarbamol (ROBAXIN) 500 MG tablet Take 1 tablet by mouth daily.  . methocarbamol (ROBAXIN) 750 MG tablet Take 500 mg by mouth 2 (two) times daily.   . Multiple Vitamins-Minerals (PRESERVISION/LUTEIN PO) Take 1 tablet by mouth 2 (two) times daily.  . nitroGLYCERIN (NITROSTAT) 0.4 MG SL tablet Place 1 tablet (0.4 mg total) under the tongue every 5 (five) minutes x 3 doses as needed for chest pain.  . pantoprazole (PROTONIX) 40 MG tablet Take 40 mg by mouth daily.  . potassium chloride (K-DUR) 10 MEQ tablet Take 10 mEq by mouth daily.   . sertraline (ZOLOFT) 100 MG tablet Take 100 mg by mouth daily.   No current facility-administered medications for this visit. (Other)      REVIEW OF SYSTEMS:    ALLERGIES Allergies  Allergen Reactions  . Lisinopril Swelling  . Eggs Or Egg-Derived Products Itching  Patient reports that she can eat eggs but does not tolerate egg derived products  . Morphine And Related     Headache  . Penicillins Itching    DID THE REACTION INVOLVE: Swelling of the face/tongue/throat, SOB, or low BP? No Sudden or severe rash/hives, skin peeling, or the inside of the mouth or nose? No Did it require medical treatment? No When did it last happen? Unknown If all above answers are "NO", may proceed with cephalosporin use.     PAST MEDICAL HISTORY Past Medical History:  Diagnosis Date  . Anxiety   . Arthritis   . COPD (chronic obstructive pulmonary disease) (HCC)   . Coronary artery disease    a. STEMI with cardiac arrest (polymorphic VT) s/p DES to RCA.  Marland Kitchen GERD (gastroesophageal reflux disease)     . Headache(784.0)   . Hearing loss, central    Left ear  . Hematoma   . Hypertension   . Memory changes   . Mild aortic stenosis   . Mild carotid artery disease (HCC)    a. 1-39% by duplex 02/2018.  Marland Kitchen Seizures (HCC) 10/2012   No seizures, speech issues; after a fall, hitting head on left side   Past Surgical History:  Procedure Laterality Date  . ABDOMINAL HYSTERECTOMY    . APPENDECTOMY    . CARDIAC CATHETERIZATION     15 yrs ago  . CORONARY/GRAFT ACUTE MI REVASCULARIZATION N/A 01/10/2018   Procedure: Coronary/Graft Acute MI Revascularization;  Surgeon: Runell Gess, MD;  Location: [facility] INVASIVE CV LAB;  Service: Cardiovascular;  Laterality: N/A;  . CRANIOTOMY Left 10/30/2012   Procedure: CRANIOTOMY HEMATOMA EVACUATION SUBDURAL;  Surgeon: Maeola Harman, MD;  Location: [facility] NEURO ORS;  Service: Neurosurgery;  Laterality: Left;  Left Craniotomy for evacuation of subdural hematoma  . JOINT REPLACEMENT Bilateral    knees  . LEFT HEART CATH AND CORONARY ANGIOGRAPHY N/A 01/10/2018   Procedure: LEFT HEART CATH AND CORONARY ANGIOGRAPHY;  Surgeon: Runell Gess, MD;  Location: [facility] INVASIVE CV LAB;  Service: Cardiovascular;  Laterality: N/A;    FAMILY HISTORY Family History  Problem Relation Age of Onset  . Cancer Father   . Ataxia Neg Hx   . Chorea Neg Hx   . Dementia Neg Hx   . Mental retardation Neg Hx   . Migraines Neg Hx   . Multiple sclerosis Neg Hx   . Neurofibromatosis Neg Hx   . Neuropathy Neg Hx   . Parkinsonism Neg Hx   . Seizures Neg Hx   . Stroke Neg Hx     SOCIAL HISTORY Social History   Tobacco Use  . Smoking status: Never Smoker  . Smokeless tobacco: Never Used  Vaping Use  . Vaping Use: Never used  Substance Use Topics  . Alcohol use: No  . Drug use: No         OPHTHALMIC EXAM: Base Eye Exam    Visual Acuity (Snellen - Linear)      Right Left   Dist cc 20/400 20/400   Dist ph cc 20/200 -1 20/200 +   Correction: Glasses       Tonometry  (Tonopen, 2:49 PM)      Right Left   Pressure 17 15       Pupils      Pupils Dark Light Shape React APD   Right PERRL 4 3 Round Brisk None   Left PERRL 4 3 Round Brisk None       Visual Fields (Counting  fingers)      Left Right    Full Full       Neuro/Psych    Oriented x3: Yes   Mood/Affect: Normal       Dilation    Left eye: 1.0% Mydriacyl, 2.5% Phenylephrine @ 2:49 PM        Slit Lamp and Fundus Exam    External Exam      Right Left   External Normal Normal       Slit Lamp Exam      Right Left   Lids/Lashes Normal Normal   Conjunctiva/Sclera White and quiet White and quiet   Cornea Clear Clear   Anterior Chamber Deep and quiet Deep and quiet   Iris Round and reactive Round and reactive   Anterior Vitreous Normal Normal       Fundus Exam      Right Left   Posterior Vitreous  Posterior vitreous detachment   Disc  Normal   C/D Ratio  0.3   Macula  Soft drusen,  Pigmented atrophy, with subfoveal white fibrotic disciform scar, no fluid, no hemorrhage   Vessels  Normal   Periphery  Normal          IMAGING AND PROCEDURES  Imaging and Procedures for 09/19/19  OCT, Retina - OU - Both Eyes       Right Eye Quality was good. Scan locations included subfoveal. Central Foveal Thickness: 304. Progression has been stable.   Left Eye Quality was good. Scan locations included subfoveal. Central Foveal Thickness: 328. Progression has been stable. Findings include subretinal hyper-reflective material, disciform scar, choroidal neovascular membrane.   Notes Clinical correlation of the examination and the OCT confirms that the apparent pigment epithelial detachment left eye is actually a white fibrotic disciform scar and thus not active                ASSESSMENT/PLAN:  Exudative age-related macular degeneration of left eye with active choroidal neovascularization (HCC) The nature of dry age related macular degeneration was discussed with the patient as  well as its possible conversion to wet. The results of the AREDS 2 study was discussed with the patient. A diet rich in dark leafy green vegetables was advised and specific recommendations were made regarding supplements with AREDS 2 formulation . Control of hypertension and serum cholesterol may slow the disease. Smoking cessation is mandatory to slow the disease and diminish the risk of progressing to wet age related macular degeneration. The patient was instructed in the use of an Amsler Grid and was told to return immediately for any changes in the Grid. Stressed to the patient do not rub eyes  In the left eye is inactive, with a white fibrotic former pigment epithelial detachment now residing is a disciform macular scar.  Overlying outer retinal tubulation's  ,  not CME and there is no sign of encroachment into the retina.  Nuclear sclerotic cataract of both eyes Proceed with cataract surgery with each eye, to maximize visual potential in the future recurrent or repeat intravitreal injections should the need arise to resume therapy      ICD-10-CM   1. Exudative age-related macular degeneration of left eye with active choroidal neovascularization (HCC)  H35.3221 OCT, Retina - OU - Both Eyes  2. Nuclear sclerotic cataract of both eyes  H25.13     1.  Will not proceed with planned injection left eye today.  Appears quiescent.  2.  Okay to proceed with cataract extraction with intraocular  lens placement in either eye to maximize visual potential long-term  3.  Ophthalmic Meds Ordered this visit:  No orders of the defined types were placed in this encounter.      Return in about 6 weeks (around 10/31/2019) for DILATE OU, OCT.  There are no Patient Instructions on file for this visit.   Explained the diagnoses, plan, and follow up with the patient and they expressed understanding.  Patient expressed understanding of the importance of proper follow up care.   Alford HighlandGary A. Leiah Giannotti  M.D. Diseases & Surgery of the Retina and Vitreous [facility] 09/19/19     Abbreviations: M myopia (nearsighted); A astigmatism; H hyperopia (farsighted); P presbyopia; Mrx spectacle prescription;  CTL contact lenses; OD right eye; OS left eye; OU both eyes  XT exotropia; ET esotropia; PEK punctate epithelial keratitis; PEE punctate epithelial erosions; DES dry eye syndrome; MGD meibomian gland dysfunction; ATs artificial tears; PFAT's preservative free artificial tears; NSC nuclear sclerotic cataract; PSC posterior subcapsular cataract; ERM epi-retinal membrane; PVD posterior vitreous detachment; RD retinal detachment; DM diabetes mellitus; DR diabetic retinopathy; NPDR non-proliferative diabetic retinopathy; PDR proliferative diabetic retinopathy; CSME clinically significant macular edema; DME diabetic macular edema; dbh dot blot hemorrhages; CWS cotton wool spot; POAG primary open angle glaucoma; C/D cup-to-disc ratio; HVF humphrey visual field; GVF goldmann visual field; OCT optical coherence tomography; IOP intraocular pressure; BRVO Branch retinal vein occlusion; CRVO central retinal vein occlusion; CRAO central retinal artery occlusion; BRAO branch retinal artery occlusion; RT retinal tear; SB scleral buckle; PPV pars plana vitrectomy; VH Vitreous hemorrhage; PRP panretinal laser photocoagulation; IVK intravitreal kenalog; VMT vitreomacular traction; MH Macular hole;  NVD neovascularization of the disc; NVE neovascularization elsewhere; AREDS age related eye disease study; ARMD age related macular degeneration; POAG primary open angle glaucoma; EBMD epithelial/anterior basement membrane dystrophy; ACIOL anterior chamber intraocular lens; IOL intraocular lens; PCIOL posterior chamber intraocular lens; Phaco/IOL phacoemulsification with intraocular lens placement; PRK photorefractive keratectomy; LASIK laser assisted in situ keratomileusis; HTN hypertension; DM diabetes mellitus; COPD  chronic obstructive pulmonary disease

## 2019-09-19 NOTE — Assessment & Plan Note (Signed)
The nature of dry age related macular degeneration was discussed with the patient as well as its possible conversion to wet. The results of the AREDS 2 study was discussed with the patient. A diet rich in dark leafy green vegetables was advised and specific recommendations were made regarding supplements with AREDS 2 formulation . Control of hypertension and serum cholesterol may slow the disease. Smoking cessation is mandatory to slow the disease and diminish the risk of progressing to wet age related macular degeneration. The patient was instructed in the use of an Amsler Grid and was told to return immediately for any changes in the Grid. Stressed to the patient do not rub eyes  In the left eye is inactive, with a white fibrotic former pigment epithelial detachment now residing is a disciform macular scar.  Overlying outer retinal tubulation's  ,  not CME and there is no sign of encroachment into the retina.

## 2019-09-19 NOTE — Assessment & Plan Note (Signed)
Proceed with cataract surgery with each eye, to maximize visual potential in the future recurrent or repeat intravitreal injections should the need arise to resume therapy

## 2019-10-25 ENCOUNTER — Ambulatory Visit: Payer: Medicare Other | Admitting: Cardiology

## 2019-10-31 ENCOUNTER — Encounter (INDEPENDENT_AMBULATORY_CARE_PROVIDER_SITE_OTHER): Payer: Medicare Other | Admitting: Ophthalmology

## 2019-11-05 DIAGNOSIS — H2513 Age-related nuclear cataract, bilateral: Secondary | ICD-10-CM | POA: Diagnosis not present

## 2019-11-05 DIAGNOSIS — H353231 Exudative age-related macular degeneration, bilateral, with active choroidal neovascularization: Secondary | ICD-10-CM | POA: Diagnosis not present

## 2019-11-05 DIAGNOSIS — G894 Chronic pain syndrome: Secondary | ICD-10-CM | POA: Diagnosis not present

## 2019-11-05 DIAGNOSIS — Z79899 Other long term (current) drug therapy: Secondary | ICD-10-CM | POA: Diagnosis not present

## 2019-11-05 DIAGNOSIS — M545 Low back pain, unspecified: Secondary | ICD-10-CM | POA: Diagnosis not present

## 2019-11-05 DIAGNOSIS — G8929 Other chronic pain: Secondary | ICD-10-CM | POA: Diagnosis not present

## 2019-11-05 DIAGNOSIS — H35431 Paving stone degeneration of retina, right eye: Secondary | ICD-10-CM | POA: Diagnosis not present

## 2019-11-11 ENCOUNTER — Other Ambulatory Visit: Payer: Self-pay

## 2019-11-11 ENCOUNTER — Encounter (INDEPENDENT_AMBULATORY_CARE_PROVIDER_SITE_OTHER): Payer: Self-pay | Admitting: Ophthalmology

## 2019-11-11 ENCOUNTER — Ambulatory Visit (INDEPENDENT_AMBULATORY_CARE_PROVIDER_SITE_OTHER): Payer: Medicare Other | Admitting: Ophthalmology

## 2019-11-11 DIAGNOSIS — H353133 Nonexudative age-related macular degeneration, bilateral, advanced atrophic without subfoveal involvement: Secondary | ICD-10-CM

## 2019-11-11 DIAGNOSIS — H353221 Exudative age-related macular degeneration, left eye, with active choroidal neovascularization: Secondary | ICD-10-CM | POA: Diagnosis not present

## 2019-11-11 DIAGNOSIS — H353212 Exudative age-related macular degeneration, right eye, with inactive choroidal neovascularization: Secondary | ICD-10-CM

## 2019-11-11 DIAGNOSIS — H2513 Age-related nuclear cataract, bilateral: Secondary | ICD-10-CM | POA: Diagnosis not present

## 2019-11-11 DIAGNOSIS — H353114 Nonexudative age-related macular degeneration, right eye, advanced atrophic with subfoveal involvement: Secondary | ICD-10-CM | POA: Insufficient documentation

## 2019-11-11 MED ORDER — BEVACIZUMAB CHEMO INJECTION 1.25MG/0.05ML SYRINGE FOR KALEIDOSCOPE
1.2500 mg | INTRAVITREAL | Status: AC | PRN
  Administered 2019-11-11: 1.25 mg via INTRAVITREAL

## 2019-11-11 NOTE — Assessment & Plan Note (Signed)
New recurrence of CNVM, despite  Macular geographic atrophy.  CNVM with CME me is noted at 3.75-month follow-up.  Repeat injection intravitreal Avastin OS today and examination in 5 to 6 weeks

## 2019-11-11 NOTE — Assessment & Plan Note (Signed)

## 2019-11-11 NOTE — Progress Notes (Signed)
11/11/2019     CHIEF COMPLAINT Patient presents for Retina Follow Up   HISTORY OF PRESENT ILLNESS: Beverly [name] is a 86 y.o. female who presents to the clinic today for:   HPI    Retina Follow Up    Patient presents with  Wet AMD.  In left eye.  Severity is moderate.  Duration of 8 weeks.  Since onset it is stable.  I, the attending physician,  performed the HPI with the patient and updated documentation appropriately.          Comments    8 Week f\u OU. OCT  Pt states colors are not as bright. Cat sx scheduled for 12/13/19. Denies any other complaints. Using gtts as directed. Pts daughter is wondering if they can get 2 bottles of drops when refill is needed.        Last edited by [name] on 11/11/2019  2:41 PM. (History)      Referring physician: Benita Stabile, MD [address] [place],  [place] [address]  HISTORICAL INFORMATION:   Selected notes from the MEDICAL RECORD NUMBER    Lab Results  Component Value Date   HGBA1C 6.1 (H) 10/19/2012     CURRENT MEDICATIONS: Current Outpatient Medications (Ophthalmic Drugs)  Medication Sig  . latanoprost (XALATAN) 0.005 % ophthalmic solution INSTILL ONE DROP IN BOTH EYES NIGHTLY  . Propylene Glycol (SYSTANE BALANCE OP) Place 1-2 drops into both eyes daily as needed (dry eyes).   No current facility-administered medications for this visit. (Ophthalmic Drugs)   Current Outpatient Medications (Other)  Medication Sig  . ALPRAZolam (XANAX) 0.5 MG tablet Take 0.5 mg by mouth 3 (three) times daily as needed for anxiety.  Marland Kitchen amLODipine (NORVASC) 5 MG tablet Take 1 tablet (5 mg total) by mouth daily.  Marland Kitchen aspirin 81 MG chewable tablet Chew 1 tablet (81 mg total) by mouth daily.  Marland Kitchen atorvastatin (LIPITOR) 80 MG tablet Take 1 tablet (80 mg total) by mouth daily at 6 PM.  . carvedilol (COREG) 12.5 MG tablet Take 25 mg by mouth 2 (two) times daily with a meal.  . celecoxib (CELEBREX) 200 MG capsule Take 200 mg by mouth  daily.  . cholecalciferol (VITAMIN D) 1000 UNITS tablet Take 1,000 Units by mouth daily.   . fluticasone (FLONASE) 50 MCG/ACT nasal spray Place 1 spray into both nostrils daily.   Marland Kitchen gabapentin (NEURONTIN) 300 MG capsule Take 300 mg by mouth daily.  Marland Kitchen HYDROcodone-acetaminophen (NORCO) 10-325 MG tablet Take 1 tablet by mouth every 6 (six) hours as needed.  . levETIRAcetam (KEPPRA) 500 MG tablet Take 500 mg by mouth 2 (two) times daily.  . methocarbamol (ROBAXIN) 500 MG tablet Take 1 tablet by mouth daily.  . methocarbamol (ROBAXIN) 750 MG tablet Take 500 mg by mouth 2 (two) times daily.   . Multiple Vitamins-Minerals (PRESERVISION/LUTEIN PO) Take 1 tablet by mouth 2 (two) times daily.  . nitroGLYCERIN (NITROSTAT) 0.4 MG SL tablet Place 1 tablet (0.4 mg total) under the tongue every 5 (five) minutes x 3 doses as needed for chest pain.  . pantoprazole (PROTONIX) 40 MG tablet Take 40 mg by mouth daily.  . potassium chloride (K-DUR) 10 MEQ tablet Take 10 mEq by mouth daily.   . sertraline (ZOLOFT) 100 MG tablet Take 100 mg by mouth daily.   No current facility-administered medications for this visit. (Other)      REVIEW OF SYSTEMS:    ALLERGIES Allergies  Allergen Reactions  .  Lisinopril Swelling  . Eggs Or Egg-Derived Products Itching    Patient reports that she can eat eggs but does not tolerate egg derived products  . Morphine And Related     Headache  . Penicillins Itching    DID THE REACTION INVOLVE: Swelling of the face/tongue/throat, SOB, or low BP? No Sudden or severe rash/hives, skin peeling, or the inside of the mouth or nose? No Did it require medical treatment? No When did it last happen? Unknown If all above answers are "NO", may proceed with cephalosporin use.     PAST MEDICAL HISTORY Past Medical History:  Diagnosis Date  . Anxiety   . Arthritis   . COPD (chronic obstructive pulmonary disease) (HCC)   . Coronary artery disease    a. STEMI with cardiac arrest  (polymorphic VT) s/p DES to RCA.  . GERD (gastroesophageal reflux disease)   . Headache(784.0)   . HearingMarland Kitchen loss, central    Left ear  . Hematoma   . Hypertension   . Memory changes   . Mild aortic stenosis   . Mild carotid artery disease (HCC)    a. 1-39% by duplex 02/2018.  Marland Kitchen. Seizures (HCC) 10/2012   No seizures, speech issues; after a fall, hitting head on left side   Past Surgical History:  Procedure Laterality Date  . ABDOMINAL HYSTERECTOMY    . APPENDECTOMY    . CARDIAC CATHETERIZATION     15 yrs ago  . CORONARY/GRAFT ACUTE MI REVASCULARIZATION N/A 01/10/2018   Procedure: Coronary/Graft Acute MI Revascularization;  Surgeon: Runell GessBerry, Jonathan J, MD;  [facility];  Service: Cardiovascular;  Laterality: N/A;  . CRANIOTOMY Left 10/30/2012   Procedure: CRANIOTOMY HEMATOMA EVACUATION SUBDURAL;  Surgeon: Maeola HarmanJoseph Stern, MD;  Location: [facility] NEURO ORS;  Service: Neurosurgery;  Laterality: Left;  Left Craniotomy for evacuation of subdural hematoma  . JOINT REPLACEMENT Bilateral    knees  . LEFT HEART CATH AND CORONARY ANGIOGRAPHY N/A 01/10/2018   Procedure: LEFT HEART CATH AND CORONARY ANGIOGRAPHY;  Surgeon: Runell GessBerry, Jonathan J, MD;  Location: [facility] INVASIVE CV LAB;  Service: Cardiovascular;  Laterality: N/A;    FAMILY HISTORY Family History  Problem Relation Age of Onset  . Cancer Father   . Ataxia Neg Hx   . Chorea Neg Hx   . Dementia Neg Hx   . Mental retardation Neg Hx   . Migraines Neg Hx   . Multiple sclerosis Neg Hx   . Neurofibromatosis Neg Hx   . Neuropathy Neg Hx   . Parkinsonism Neg Hx   . Seizures Neg Hx   . Stroke Neg Hx     SOCIAL HISTORY Social History   Tobacco Use  . Smoking status: Never Smoker  . Smokeless tobacco: Never Used  Vaping Use  . Vaping Use: Never used  Substance Use Topics  . Alcohol use: No  . Drug use: No         OPHTHALMIC EXAM: Base Eye Exam    Visual Acuity (Snellen - Linear)      Right Left   Dist cc 20/200 -1 20/200  -1   Dist ph cc NI 20/200       Tonometry (Tonopen, 2:39 PM)      Right Left   Pressure 22 21       Pupils      Pupils Dark Light Shape React APD   Right PERRL 3 3 Round Minimal None   Left PERRL 3 3 Round Minimal None  Neuro/Psych    Oriented x3: Yes   Mood/Affect: Normal       Dilation    Both eyes: 1.0% Mydriacyl, 2.5% Phenylephrine @ 2:39 PM        Slit Lamp and Fundus Exam    External Exam      Right Left   External Normal Normal       Slit Lamp Exam      Right Left   Lids/Lashes Normal Normal   Conjunctiva/Sclera White and quiet White and quiet   Cornea Clear Clear   Anterior Chamber Deep and quiet Deep and quiet   Iris Round and reactive Round and reactive   Lens 3+ Nuclear sclerosis 3+ Nuclear sclerosis   Anterior Vitreous Normal Normal       Fundus Exam      Right Left   Posterior Vitreous Posterior vitreous detachment Posterior vitreous detachment   Disc Normal Normal   C/D Ratio 0.2 0.3   Macula Geographic atrophy, Advanced age related macular degeneration, no exudates, no macular thickening, Retinal pigment epithelial mottling Soft drusen,  Pigmented atrophy, with subfoveal white fibrotic disciform scar, no fluid, no hemorrhage,  in wheelchair, 20 diopter lens only   Vessels Normal Normal   Periphery Normal Normal          IMAGING AND PROCEDURES  Imaging and Procedures for 11/11/19  OCT, Retina - OU - Both Eyes       Right Eye Quality was good. Scan locations included subfoveal. Central Foveal Thickness: 311. Findings include outer retinal atrophy, central retinal atrophy, subretinal scarring, epiretinal membrane.   Left Eye Quality was good. Scan locations included subfoveal. Central Foveal Thickness: 437. Findings include subretinal scarring, choroidal neovascular membrane, cystoid macular edema.   Notes Recurrent CME, CNVM with subretinal scarring left eye including a region of geographic atrophy temporal to the fovea.  OD,  subretinal scarring, no active CNVM, persistent mild ERM OD       Intravitreal Injection, Pharmacologic Agent - OS - Left Eye       Time Out 11/11/2019. 3:24 PM. Confirmed correct patient, procedure, site, and patient consented.   Anesthesia Topical anesthesia was used. Anesthetic medications included Akten 3.5%.   Procedure Preparation included Ofloxacin , 10% betadine to eyelids, 5% betadine to ocular surface. A 30 gauge needle was used.   Injection:  1.25 mg Bevacizumab (AVASTIN) SOLN   NDC: 70360-001-02, Lot: [id number]   Route: Intravitreal, Site: Left Eye, Waste: 0 mg  Post-op Post injection exam found visual acuity of at least counting fingers. The patient tolerated the procedure well. There were no complications. The patient received written and verbal post procedure care education. Post injection medications were not given.                 ASSESSMENT/PLAN:  Exudative age-related macular degeneration of left eye with active choroidal neovascularization (HCC) New recurrence of CNVM, despite  Macular geographic atrophy.  CNVM with CME me is noted at 3.46-month follow-up.  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 The results of the AREDS 2 study was discussed with the patient. A diet rich in dark leafy green vegetables was advised and specific recommendations were made regarding supplements  with AREDS 2 formulation . Control of hypertension and serum cholesterol may slow the disease. Smoking cessation is mandatory to slow the disease and diminish the risk of progressing to wet age related macular degeneration. The patient was instructed in the use of an Amsler Grid and was told to return immediately for any changes in the Grid. Stressed to the patient do not rub eyes      ICD-10-CM   1. Exudative age-related macular degeneration of left eye with active choroidal neovascularization (HCC)  H35.3221 OCT, Retina - OU - Both Eyes    Intravitreal Injection, Pharmacologic Agent - OS - Left Eye    Bevacizumab (AVASTIN) SOLN 1.25 mg  2. Nuclear sclerotic cataract of both eyes  H25.13   3. 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 neovascularization (HCC)  H35.3212   4. 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  Will restart intravitreal antivegF therapy left eye today for recurrent CNVM, current 3.53-month interval.  2.  Dilated OS next visit 5 to 6 weeks for possible intravitreal Avastin OS  3.  Proceed with cataract extraction intraocular lens placement in each eye as scheduled in order to maximize visual potential and allow enhanced medical monitoring of the macular conditions Ophthalmic Meds Ordered this visit:  Meds ordered this encounter  Medications  . Bevacizumab (AVASTIN) SOLN 1.25 mg       Return in about 5 weeks (around 12/16/2019) for dilate, OS, AVASTIN OCT.  There are no Patient Instructions on file for this visit.   Explained the diagnoses, plan, and follow up with the patient and they expressed understanding.  Patient expressed understanding of the importance of proper follow up care.   Alford Highland Mohsin Crum M.D. Diseases & Surgery of the Retina and Vitreous [facility] 11/11/19     Abbreviations: M myopia (nearsighted); A astigmatism; H  hyperopia (farsighted); P presbyopia; Mrx spectacle prescription;  CTL contact lenses; OD right eye; OS left eye; OU both eyes  XT exotropia; ET esotropia; PEK punctate epithelial keratitis; PEE punctate epithelial erosions; DES dry eye syndrome; MGD meibomian gland dysfunction; ATs artificial tears; PFAT's preservative free artificial tears; NSC nuclear sclerotic cataract; PSC posterior subcapsular cataract; ERM epi-retinal membrane; PVD posterior vitreous detachment; RD retinal detachment; DM diabetes mellitus; DR diabetic retinopathy; NPDR non-proliferative diabetic retinopathy; PDR proliferative diabetic retinopathy; CSME clinically significant macular edema; DME diabetic macular edema; dbh dot blot hemorrhages; CWS cotton wool spot; POAG primary open angle glaucoma; C/D cup-to-disc ratio; HVF humphrey visual field; GVF goldmann visual field; OCT optical coherence tomography; IOP intraocular pressure; BRVO Branch retinal vein occlusion; CRVO central retinal vein occlusion; CRAO central retinal artery occlusion; BRAO branch retinal artery occlusion; RT retinal tear; SB scleral buckle; PPV pars plana vitrectomy; VH Vitreous hemorrhage; PRP panretinal laser photocoagulation; IVK intravitreal kenalog; VMT vitreomacular traction; MH Macular hole;  NVD neovascularization of the disc; NVE neovascularization elsewhere; AREDS age related eye disease study; ARMD age related macular degeneration; POAG primary open angle glaucoma; EBMD epithelial/anterior basement membrane dystrophy; ACIOL anterior chamber intraocular lens; IOL intraocular lens; PCIOL posterior chamber intraocular lens; Phaco/IOL phacoemulsification with intraocular lens placement; PRK photorefractive keratectomy; LASIK laser assisted in situ keratomileusis; HTN hypertension; DM diabetes mellitus;  COPD chronic obstructive pulmonary disease

## 2019-11-11 NOTE — Assessment & Plan Note (Signed)
Proceed with cataract traction on each eye as scheduled in order to maximize visual potential as well as monitoring clinical response to care

## 2019-11-11 NOTE — Assessment & Plan Note (Signed)
No active CNVM currently, will observe

## 2019-11-17 NOTE — Progress Notes (Deleted)
Cardiology Office Note  Date: 11/17/2019   ID: Beverly [name], DOB [birth year], MRN [id number]  PCP:  Benita Stabile, MD  Cardiologist:  Nona Dell, MD Electrophysiologist:  None   No chief complaint on file.   History of Present Illness: Beverly [name] is an 86 y.o. female last seen in April.  She is due for a follow-up lipid panel.  Past Medical History:  Diagnosis Date  . Anxiety   . Arthritis   . COPD (chronic obstructive pulmonary disease) (HCC)   . Coronary artery disease    a. STEMI with cardiac arrest (polymorphic VT) s/p DES to RCA.  Marland Kitchen GERD (gastroesophageal reflux disease)   . Headache(784.0)   . Hearing loss, central    Left ear  . Hematoma   . Hypertension   . Memory changes   . Mild aortic stenosis   . Mild carotid artery disease (HCC)    a. 1-39% by duplex 02/2018.  Marland Kitchen Seizures (HCC) 10/2012   No seizures, speech issues; after a fall, hitting head on left side    Past Surgical History:  Procedure Laterality Date  . ABDOMINAL HYSTERECTOMY    . APPENDECTOMY    . CARDIAC CATHETERIZATION     15 yrs ago  . CORONARY/GRAFT ACUTE MI REVASCULARIZATION N/A 01/10/2018   Procedure: Coronary/Graft Acute MI Revascularization;  Surgeon: Runell Gess, MD;  [facility] CV LAB;  Service: Cardiovascular;  Laterality: N/A;  . CRANIOTOMY Left 10/30/2012   Procedure: CRANIOTOMY HEMATOMA EVACUATION SUBDURAL;  Surgeon: Maeola Harman, MD;  Location: [facility] NEURO ORS;  Service: Neurosurgery;  Laterality: Left;  Left Craniotomy for evacuation of subdural hematoma  . JOINT REPLACEMENT Bilateral    knees  . LEFT HEART CATH AND CORONARY ANGIOGRAPHY N/A 01/10/2018   Procedure: LEFT HEART CATH AND CORONARY ANGIOGRAPHY;  Surgeon: Runell Gess, MD;  Location: [facility] INVASIVE CV LAB;  Service: Cardiovascular;  Laterality: N/A;    Current Outpatient Medications  Medication Sig Dispense Refill  . ALPRAZolam (XANAX) 0.5 MG tablet Take 0.5 mg by mouth 3 (three) times daily as  needed for anxiety.    Marland Kitchen amLODipine (NORVASC) 5 MG tablet Take 1 tablet (5 mg total) by mouth daily. 180 tablet 3  . aspirin 81 MG chewable tablet Chew 1 tablet (81 mg total) by mouth daily.    Marland Kitchen atorvastatin (LIPITOR) 80 MG tablet Take 1 tablet (80 mg total) by mouth daily at 6 PM. 90 tablet 4  . carvedilol (COREG) 12.5 MG tablet Take 25 mg by mouth 2 (two) times daily with a meal.    . celecoxib (CELEBREX) 200 MG capsule Take 200 mg by mouth daily.    . cholecalciferol (VITAMIN D) 1000 UNITS tablet Take 1,000 Units by mouth daily.     . fluticasone (FLONASE) 50 MCG/ACT nasal spray Place 1 spray into both nostrils daily.     Marland Kitchen gabapentin (NEURONTIN) 300 MG capsule Take 300 mg by mouth daily.    Marland Kitchen HYDROcodone-acetaminophen (NORCO) 10-325 MG tablet Take 1 tablet by mouth every 6 (six) hours as needed.    . latanoprost (XALATAN) 0.005 % ophthalmic solution INSTILL ONE DROP IN BOTH EYES NIGHTLY 2.5 mL 6  . levETIRAcetam (KEPPRA) 500 MG tablet Take 500 mg by mouth 2 (two) times daily.    . methocarbamol (ROBAXIN) 500 MG tablet Take 1 tablet by mouth daily.    . methocarbamol (ROBAXIN) 750 MG tablet Take 500 mg by mouth 2 (two) times daily.     Marland Kitchen  Multiple Vitamins-Minerals (PRESERVISION/LUTEIN PO) Take 1 tablet by mouth 2 (two) times daily.    . nitroGLYCERIN (NITROSTAT) 0.4 MG SL tablet Place 1 tablet (0.4 mg total) under the tongue every 5 (five) minutes x 3 doses as needed for chest pain. 25 tablet 0  . pantoprazole (PROTONIX) 40 MG tablet Take 40 mg by mouth daily.    . potassium chloride (K-DUR) 10 MEQ tablet Take 10 mEq by mouth daily.     Marland Kitchen Propylene Glycol (SYSTANE BALANCE OP) Place 1-2 drops into both eyes daily as needed (dry eyes).    . sertraline (ZOLOFT) 100 MG tablet Take 100 mg by mouth daily.     No current facility-administered medications for this visit.   Allergies:  Lisinopril, Eggs or egg-derived products, Morphine and related, and Penicillins   Social History: The patient   reports that she has never smoked. She has never used smokeless tobacco. She reports that she does not drink alcohol and does not use drugs.   Family History: The patient's family history includes Cancer in her father.   ROS:  Please see the history of present illness. Otherwise, complete review of systems is positive for {NONE DEFAULTED:18576::"none"}.  All other systems are reviewed and negative.   Physical Exam: VS:  There were no vitals taken for this visit., BMI There is no height or weight on file to calculate BMI.  Wt Readings from Last 3 Encounters:  07/24/19 112 lb 5 oz (50.9 kg)  05/03/19 107 lb (48.5 kg)  04/29/19 107 lb (48.5 kg)    General: Patient appears comfortable at rest. HEENT: Conjunctiva and lids normal, oropharynx clear with moist mucosa. Neck: Supple, no elevated JVP or carotid bruits, no thyromegaly. Lungs: Clear to auscultation, nonlabored breathing at rest. Cardiac: Regular rate and rhythm, no S3 or significant systolic murmur, no pericardial rub. Abdomen: Soft, nontender, no hepatomegaly, bowel sounds present, no guarding or rebound. Extremities: No pitting edema, distal pulses 2+. Skin: Warm and dry. Musculoskeletal: No kyphosis. Neuropsychiatric: Alert and oriented x3, affect grossly appropriate.  ECG:  An ECG dated 01/09/2019 was personally reviewed today and demonstrated:  Sinus tachycardia with nonspecific ST changes.  Recent Labwork: 01/09/2019: BUN 10; Creatinine, Ser 0.52; Hemoglobin 11.4; Platelets 342; Potassium 3.8; Sodium 135     Component Value Date/Time   CHOL 134 03/12/2018 1450   TRIG 41 03/12/2018 1450   HDL 97 03/12/2018 1450   CHOLHDL 1.4 03/12/2018 1450   CHOLHDL 1.8 01/10/2018 0428   VLDL 8 01/10/2018 0428   LDLCALC 29 03/12/2018 1450    Other Studies Reviewed Today:  Carotid Dopplers 01/26/2018: Summary: Right Carotid: Velocities in the right ICA are consistent with a 1-39% stenosis. The RICA velocities remain  within normal range and essentially stable compared to the prior exam.  Left Carotid: Velocities in the left ICA are consistent with a 1-39% stenosis. The LICA velocities remain within normal range and have decreased compared to the prior exam.  Vertebrals: Bilateral vertebral arteries demonstrate antegrade flow. Subclavians: Normal flow hemodynamics were seen in bilateral subclavian arteries.  Echocardiogram 01/10/2018: Study Conclusions  - Left ventricle: The cavity size was normal. Wall thickness was increased in a pattern of mild LVH. Basal to mid inferior akinesis. Basal inferoseptal akinesis. The estimated ejection fraction was 55%. Doppler parameters are consistent with abnormal left ventricular relaxation (grade 1 diastolic dysfunction). - Aortic valve: Trileaflet; moderately calcified leaflets. There was mild stenosis. Mean gradient (S): 13 mm Hg. Valve area (Vmean): 1.74 cm^2. - Mitral valve:  There was trivial regurgitation. - Left atrium: The atrium was mildly dilated. - Right ventricle: The cavity size was normal. Systolic function was normal. - Pulmonary arteries: No complete TR doppler jet so unable to estimate PA systolic pressure. - Inferior vena cava: The vessel was normal in size. The respirophasic diameter changes were in the normal range (>= 50%), consistent with normal central venous pressure.  Impressions:  - Normal LV size with mild LV hypertrophy. EF 55% with basal to mid inferior akinesis and basal inferoseptal akinesis. Normal RV size and systolic function. Mild aortic stenosis.  Cardiac catheterization and PCI 01/10/2018:  Mid RCA lesion is 99% stenosed.  A drug-eluting stent was successfully placed.  Post intervention, there is a 0% residual stenosis.  IMPRESSION:Successful PCI and drug-eluting stenting of a occluded dominant RCA with faint left-to-right  collaterals. Vessel opened with injection and was subtotally occluded with visible thrombus. The door to balloon time was 27 minutes.The patient remained hemodynamically stable throughout the case on IV epinephrine and amiodarone. Angiomax will continue for 4 hours full dose. The sheath was then removed be removed and pressure held. Her enzymes will be cycled. 2D echo has been ordered. She will be treated with routine post MI pharmacology including high-dose statin drug, beta-blocker depending on her blood pressure. She can probably be "fast tracked" given her normal left system. She left the lab in stable condition.  Assessment and Plan:   Medication Adjustments/Labs and Tests Ordered: Current medicines are reviewed at length with the patient today.  Concerns regarding medicines are outlined above.   Tests Ordered: No orders of the defined types were placed in this encounter.   Medication Changes: No orders of the defined types were placed in this encounter.   Disposition:  Follow up {follow up:15908}  Signed, Jonelle Sidle, MD, [facility] 11/17/2019 7:37 PM    [facility] [address] [place], [place] [address] Phone: [phone]; Fax: [phone]

## 2019-11-18 ENCOUNTER — Ambulatory Visit: Payer: Medicare Other | Admitting: Cardiology

## 2019-11-18 DIAGNOSIS — E782 Mixed hyperlipidemia: Secondary | ICD-10-CM

## 2019-11-18 DIAGNOSIS — I25119 Atherosclerotic heart disease of native coronary artery with unspecified angina pectoris: Secondary | ICD-10-CM

## 2019-12-03 NOTE — Progress Notes (Signed)
Cardiology Clinic Note   Patient Name: Beverly [name] Date of Encounter: 12/04/2019  Primary Care Provider:  Benita Stabile, MD Primary Cardiologist:  Nona Dell, MD  Patient Profile    Beverly [name] 86 year old female presents the clinic today for follow-up evaluation of her coronary artery disease and hypertension.  Past Medical History    Past Medical History:  Diagnosis Date  . Anxiety   . Arthritis   . COPD (chronic obstructive pulmonary disease) (HCC)   . Coronary artery disease    a. STEMI with cardiac arrest (polymorphic VT) s/p DES to RCA.  Marland Kitchen GERD (gastroesophageal reflux disease)   . Headache(784.0)   . Hearing loss, central    Left ear  . Hematoma   . Hypertension   . Memory changes   . Mild aortic stenosis   . Mild carotid artery disease (HCC)    a. 1-39% by duplex 02/2018.  Marland Kitchen Seizures (HCC) 10/2012   No seizures, speech issues; after a fall, hitting head on left side   Past Surgical History:  Procedure Laterality Date  . ABDOMINAL HYSTERECTOMY    . APPENDECTOMY    . CARDIAC CATHETERIZATION     15 yrs ago  . CORONARY/GRAFT ACUTE MI REVASCULARIZATION N/A 01/10/2018   Procedure: Coronary/Graft Acute MI Revascularization;  Surgeon: Runell Gess, MD;  Location: [facility] INVASIVE CV LAB;  Service: Cardiovascular;  Laterality: N/A;  . CRANIOTOMY Left 10/30/2012   Procedure: CRANIOTOMY HEMATOMA EVACUATION SUBDURAL;  Surgeon: Maeola Harman, MD;  Location: [facility] NEURO ORS;  Service: Neurosurgery;  Laterality: Left;  Left Craniotomy for evacuation of subdural hematoma  . JOINT REPLACEMENT Bilateral    knees  . LEFT HEART CATH AND CORONARY ANGIOGRAPHY N/A 01/10/2018   Procedure: LEFT HEART CATH AND CORONARY ANGIOGRAPHY;  Surgeon: Runell Gess, MD;  Location: [facility] INVASIVE CV LAB;  Service: Cardiovascular;  Laterality: N/A;    Allergies  Allergies  Allergen Reactions  . Lisinopril Swelling  . Eggs Or Egg-Derived Products Itching    Patient reports that she can  eat eggs but does not tolerate egg derived products  . Morphine And Related     Headache  . Penicillins Itching    DID THE REACTION INVOLVE: Swelling of the face/tongue/throat, SOB, or low BP? No Sudden or severe rash/hives, skin peeling, or the inside of the mouth or nose? No Did it require medical treatment? No When did it last happen? Unknown If all above answers are "NO", may proceed with cephalosporin use.     History of Present Illness    Ms. Salomon [name] has a PMH of TIA, hypertension, STEMI inferior wall, cardiac arrest, COPD, subdural hematoma, hyponatremia, dyslipidemia, hypokalemia, and hypomagnesemia.  Cardiac catheterization 01/10/2018 showed mid RCA lesion 99%.  She received PCI with DES x1 to her mid RCA.  She was last seen by Dr. Diona Browner on 04/29/2019.  She denied anginal symptoms at that time.  She was continued on her aspirin.  She reported that she had some falls which was concerning due to her increased risk of bleeding.  Her Brilinta was stopped.  Her lisinopril was increased to 20 mg twice daily and her blood pressure was better controlled.  She was tolerating her medications well and denied side effects.  She presents the clinic today with her daughter for follow-up evaluation states she feels well today.  She is planning to have cataract surgery and I have explained that it is a low risk procedure and instructed her to have the  eye surgeon send a formal cardiac clearance form so that we may send back a preoperative cardiac evaluation form.  At this time she is stable from a cardiac standpoint.  She has noticed a variation in her sleep pattern and was told to take melatonin and serotonin which caused bad dreams.  The medication was discontinued.  She has also noted some headaches in the morning when she wakes up.  Her daughter has been checking her blood pressure which is remained stable in the 120s 130s over 60s.  She states that she will take 1 Tylenol which helps with the pain.   It does not feel like headaches that she was having previously when her blood pressure was high.  I have instructed them that they may use heating pad, range of motion activities, and continue to use Tylenol as long as they use less than 3 g in a day.  She reports a heart healthy diet.  She is limited in her physical activity due to her cervical and thoracic arthritis.  We will have her follow-up in 6 months and as needed.  Today she denies chest pain, shortness of breath, lower extremity edema, fatigue, palpitations, melena, hematuria, hemoptysis, diaphoresis, weakness, presyncope, syncope, orthopnea, and PND.  Home Medications    Prior to Admission medications   Medication Sig Start Date End Date Taking? Authorizing Provider  ALPRAZolam Prudy Feeler) 0.5 MG tablet Take 0.5 mg by mouth 3 (three) times daily as needed for anxiety.    [provider]  amLODipine (NORVASC) 5 MG tablet Take 1 tablet (5 mg total) by mouth daily. 05/29/19 08/27/19  Jonelle Sidle, MD  aspirin 81 MG chewable tablet Chew 1 tablet (81 mg total) by mouth daily. 01/13/18   Filbert Schilder, NP  atorvastatin (LIPITOR) 80 MG tablet Take 1 tablet (80 mg total) by mouth daily at 6 PM. 01/12/18   Filbert Schilder, NP  carvedilol (COREG) 12.5 MG tablet Take 25 mg by mouth 2 (two) times daily with a meal.    [provider]  celecoxib (CELEBREX) 200 MG capsule Take 200 mg by mouth daily.    [provider]  cholecalciferol (VITAMIN D) 1000 UNITS tablet Take 1,000 Units by mouth daily.     [provider]  fluticasone (FLONASE) 50 MCG/ACT nasal spray Place 1 spray into both nostrils daily.  09/18/12   [provider]  gabapentin (NEURONTIN) 300 MG capsule Take 300 mg by mouth daily. 04/12/19   [provider]  HYDROcodone-acetaminophen (NORCO) 10-325 MG tablet Take 1 tablet by mouth every 6 (six) hours as needed.    [provider]  latanoprost (XALATAN) 0.005 % ophthalmic  solution INSTILL ONE DROP IN BOTH EYES NIGHTLY 07/09/19   Rankin, Alford Highland, MD  levETIRAcetam (KEPPRA) 500 MG tablet Take 500 mg by mouth 2 (two) times daily.    [provider]  methocarbamol (ROBAXIN) 500 MG tablet Take 1 tablet by mouth daily. 02/22/19   [provider]  methocarbamol (ROBAXIN) 750 MG tablet Take 500 mg by mouth 2 (two) times daily.     [provider]  Multiple Vitamins-Minerals (PRESERVISION/LUTEIN PO) Take 1 tablet by mouth 2 (two) times daily.    [provider]  nitroGLYCERIN (NITROSTAT) 0.4 MG SL tablet Place 1 tablet (0.4 mg total) under the tongue every 5 (five) minutes x 3 doses as needed for chest pain. 01/12/18   Georgie Chard D, NP  pantoprazole (PROTONIX) 40 MG tablet Take 40 mg by  mouth daily.    [provider]  potassium chloride (K-DUR) 10 MEQ tablet Take 10 mEq by mouth daily.     [provider]  Propylene Glycol (SYSTANE BALANCE OP) Place 1-2 drops into both eyes daily as needed (dry eyes).    [provider]  sertraline (ZOLOFT) 100 MG tablet Take 100 mg by mouth daily. 03/29/19   [provider]  lisinopril (ZESTRIL) 20 MG tablet Take 1 tablet (20 mg total) by mouth 2 (two) times daily. 01/24/19 05/03/19  Ellsworth LennoxStrader, Brittany M, PA-C    Family History    Family History  Problem Relation Age of Onset  . Cancer Father   . Ataxia Neg Hx   . Chorea Neg Hx   . Dementia Neg Hx   . Mental retardation Neg Hx   . Migraines Neg Hx   . Multiple sclerosis Neg Hx   . Neurofibromatosis Neg Hx   . Neuropathy Neg Hx   . Parkinsonism Neg Hx   . Seizures Neg Hx   . Stroke Neg Hx    She indicated that her mother is deceased. She indicated that her father is deceased. She indicated that the status of her neg hx is unknown.  Social History    Social History   Socioeconomic History  . Marital status: Married    Spouse name: Not on file  . Number of children: Not on file  . Years of education: Not  on file  . Highest education level: Not on file  Occupational History  . Not on file  Tobacco Use  . Smoking status: Never Smoker  . Smokeless tobacco: Never Used  Vaping Use  . Vaping Use: Never used  Substance and Sexual Activity  . Alcohol use: No  . Drug use: No  . Sexual activity: Not on file  Other Topics Concern  . Not on file  Social History Narrative  . Not on file   Social Determinants of Health   Financial Resource Strain:   . Difficulty of Paying Living Expenses: Not on file  Food Insecurity:   . Worried About Programme researcher, broadcasting/film/videounning Out of Food in the Last Year: Not on file  . Ran Out of Food in the Last Year: Not on file  Transportation Needs:   . Lack of Transportation (Medical): Not on file  . Lack of Transportation (Non-Medical): Not on file  Physical Activity:   . Days of Exercise per Week: Not on file  . Minutes of Exercise per Session: Not on file  Stress:   . Feeling of Stress : Not on file  Social Connections:   . Frequency of Communication with Friends and Family: Not on file  . Frequency of Social Gatherings with Friends and Family: Not on file  . Attends Religious Services: Not on file  . Active Member of Clubs or Organizations: Not on file  . Attends BankerClub or Organization Meetings: Not on file  . Marital Status: Not on file  Intimate Partner Violence:   . Fear of Current or Ex-Partner: Not on file  . Emotionally Abused: Not on file  . Physically Abused: Not on file  . Sexually Abused: Not on file     Review of Systems    General:  No chills, fever, night sweats or weight changes.  Cardiovascular:  No chest pain, dyspnea on exertion, edema, orthopnea, palpitations, paroxysmal nocturnal dyspnea. Dermatological: No rash, lesions/masses Respiratory: No cough, dyspnea Urologic: No hematuria, dysuria Abdominal:   No nausea, vomiting, diarrhea, bright  red blood per rectum, melena, or hematemesis Neurologic:  No visual changes, wkns, changes in mental  status. All other systems reviewed and are otherwise negative except as noted above.  Physical Exam    VS:  BP 122/60   Pulse 76   Ht  (1.499 m)   Wt 117 lb (53.1 kg)   SpO2 95%   BMI 23.63 kg/m  , BMI Body mass index is 23.63 kg/m. GEN: Well nourished, well developed, in no acute distress. HEENT: normal. Neck: Supple, no JVD, carotid bruits, or masses. Cardiac: RRR, no murmurs, rubs, or gallops. No clubbing, cyanosis, edema.  Radials/DP/PT 2+ and equal bilaterally.  Respiratory:  Respirations regular and unlabored, clear to auscultation bilaterally. GI: Soft, nontender, nondistended, BS + x 4. MS: no deformity or atrophy. Skin: warm and dry, no rash. Neuro:  Strength and sensation are intact. Psych: Normal affect.  Accessory Clinical Findings    Recent Labs: 01/09/2019: BUN 10; Creatinine, Ser 0.52; Hemoglobin 11.4; Platelets 342; Potassium 3.8; Sodium 135   Recent Lipid Panel    Component Value Date/Time   CHOL 134 03/12/2018 1450   TRIG 41 03/12/2018 1450   HDL 97 03/12/2018 1450   CHOLHDL 1.4 03/12/2018 1450   CHOLHDL 1.8 01/10/2018 0428   VLDL 8 01/10/2018 0428   LDLCALC 29 03/12/2018 1450    ECG personally reviewed by me today-  None today. Echocardiogram 01/10/2018  Study Conclusions   - Left ventricle: The cavity size was normal. Wall thickness was  increased in a pattern of mild LVH. Basal to mid inferior  akinesis. Basal inferoseptal akinesis. The estimated ejection  fraction was 55%. Doppler parameters are consistent with abnormal  left ventricular relaxation (grade 1 diastolic dysfunction).  - Aortic valve: Trileaflet; moderately calcified leaflets. There  was mild stenosis. Mean gradient (S): 13 mm Hg. Valve area  (Vmean): 1.74 cm^2.  - Mitral valve: There was trivial regurgitation.  - Left atrium: The atrium was mildly dilated.  - Right ventricle: The cavity size was normal. Systolic function  was normal.  - Pulmonary arteries:  No complete TR doppler jet so unable to  estimate PA systolic pressure.  - Inferior vena cava: The vessel was normal in size. The  respirophasic diameter changes were in the normal range (>= 50%),  consistent with normal central venous pressure.   Impressions:   - Normal LV size with mild LV hypertrophy. EF 55% with basal to mid  inferior akinesis and basal inferoseptal akinesis. Normal RV size  and systolic function. Mild aortic stenosis.   Cardiac catheterization 01/10/2018  Mid RCA lesion is 99% stenosed.  A drug-eluting stent was successfully placed.  Post intervention, there is a 0% residual stenosis. Diagnostic Dominance: Right  Intervention     Assessment & Plan   1.  Coronary artery disease-no chest pain today.  No recent episodes of chest arm neck pain.  Underwent cardiac catheterization 01/10/2018 which showed mid RCA lesion.  She received PCI and DES x1 Continue amlodipine, aspirin, atorvastatin, carvedilol, nitroglycerin Heart healthy low-sodium diet-salty 6 given Increase physical activity as tolerated  Essential hypertension-BP today 122/60.  Well-controlled at home Continue amlodipine, carvedilol Heart healthy low-sodium diet-salty 6 given Increase physical activity as tolerated  Mixed hyperlipidemia-LDL 29 on 03/12/2018 Heart healthy low-sodium high-fiber diet Increase physical activity as tolerated Repeat fasting lipid panel and LFTs  Disposition: Follow-up with Dr. Diona Browner in 6 months.  Thomasene Ripple. Adrean Findlay NP-C    12/04/2019, 4:18 PM [facility] [address] Office [phone] Fax [phone]  Notice: This dictation was prepared with Dragon dictation along with smaller phrase technology. Any transcriptional errors that result from this process are unintentional and may not be corrected upon review.

## 2019-12-04 ENCOUNTER — Encounter: Payer: Self-pay | Admitting: General Practice

## 2019-12-04 ENCOUNTER — Other Ambulatory Visit: Payer: Self-pay

## 2019-12-04 ENCOUNTER — Ambulatory Visit: Payer: Medicare Other | Admitting: General Practice

## 2019-12-04 VITALS — BP 122/60 | HR 76 | Ht 59.0 in | Wt 117.0 lb

## 2019-12-04 DIAGNOSIS — I25119 Atherosclerotic heart disease of native coronary artery with unspecified angina pectoris: Secondary | ICD-10-CM | POA: Diagnosis not present

## 2019-12-04 DIAGNOSIS — I10 Essential (primary) hypertension: Secondary | ICD-10-CM | POA: Diagnosis not present

## 2019-12-04 DIAGNOSIS — E782 Mixed hyperlipidemia: Secondary | ICD-10-CM

## 2019-12-04 MED ORDER — ACETAMINOPHEN 500 MG PO TABS: 500.0000 mg | ORAL_TABLET | ORAL | Status: DC | PRN

## 2019-12-04 NOTE — Patient Instructions (Signed)
Medication Instructions:   May use Tylenol as needed - no more than 3 gm / day.   Continue all other medications.    Labwork: none  Testing/Procedures: none  Follow-Up: 6 months   Any Other Special Instructions Will Be Listed Below (If Applicable).  If you need a refill on your cardiac medications before your next appointment, please call your pharmacy.

## 2019-12-13 DIAGNOSIS — H25811 Combined forms of age-related cataract, right eye: Secondary | ICD-10-CM | POA: Diagnosis not present

## 2019-12-16 ENCOUNTER — Encounter (INDEPENDENT_AMBULATORY_CARE_PROVIDER_SITE_OTHER): Payer: Medicare Other | Admitting: Ophthalmology

## 2020-01-15 DIAGNOSIS — G8929 Other chronic pain: Secondary | ICD-10-CM | POA: Diagnosis not present

## 2020-01-15 DIAGNOSIS — Z79899 Other long term (current) drug therapy: Secondary | ICD-10-CM | POA: Diagnosis not present

## 2020-01-15 DIAGNOSIS — M545 Low back pain, unspecified: Secondary | ICD-10-CM | POA: Diagnosis not present

## 2020-01-15 DIAGNOSIS — G894 Chronic pain syndrome: Secondary | ICD-10-CM | POA: Diagnosis not present

## 2020-01-22 DIAGNOSIS — I10 Essential (primary) hypertension: Secondary | ICD-10-CM | POA: Diagnosis not present

## 2020-01-22 DIAGNOSIS — R519 Headache, unspecified: Secondary | ICD-10-CM | POA: Diagnosis not present

## 2020-02-20 ENCOUNTER — Encounter (INDEPENDENT_AMBULATORY_CARE_PROVIDER_SITE_OTHER): Payer: Medicare Other | Admitting: Ophthalmology

## 2020-02-23 ENCOUNTER — Emergency Department (HOSPITAL_COMMUNITY)
Admission: EM | Admit: 2020-02-23 | Discharge: 2020-02-23 | Disposition: A | Discharge status: Home / Self Care | Payer: Medicare Other | Attending: Emergency Medicine | Admitting: Emergency Medicine

## 2020-02-23 ENCOUNTER — Encounter (HOSPITAL_COMMUNITY): Payer: Self-pay | Admitting: Emergency Medicine

## 2020-02-23 ENCOUNTER — Other Ambulatory Visit: Payer: Self-pay

## 2020-02-23 ENCOUNTER — Emergency Department (HOSPITAL_COMMUNITY): Payer: Medicare Other

## 2020-02-23 DIAGNOSIS — I2510 Atherosclerotic heart disease of native coronary artery without angina pectoris: Secondary | ICD-10-CM | POA: Diagnosis not present

## 2020-02-23 DIAGNOSIS — Y92009 Unspecified place in unspecified non-institutional (private) residence as the place of occurrence of the external cause: Secondary | ICD-10-CM | POA: Diagnosis not present

## 2020-02-23 DIAGNOSIS — S0992XA Unspecified injury of nose, initial encounter: Secondary | ICD-10-CM | POA: Diagnosis present

## 2020-02-23 DIAGNOSIS — J449 Chronic obstructive pulmonary disease, unspecified: Secondary | ICD-10-CM | POA: Diagnosis not present

## 2020-02-23 DIAGNOSIS — Z79899 Other long term (current) drug therapy: Secondary | ICD-10-CM | POA: Diagnosis not present

## 2020-02-23 DIAGNOSIS — W06XXXA Fall from bed, initial encounter: Secondary | ICD-10-CM | POA: Insufficient documentation

## 2020-02-23 DIAGNOSIS — S022XXA Fracture of nasal bones, initial encounter for closed fracture: Secondary | ICD-10-CM | POA: Diagnosis not present

## 2020-02-23 DIAGNOSIS — M542 Cervicalgia: Secondary | ICD-10-CM | POA: Diagnosis not present

## 2020-02-23 DIAGNOSIS — Z7952 Long term (current) use of systemic steroids: Secondary | ICD-10-CM | POA: Insufficient documentation

## 2020-02-23 DIAGNOSIS — R58 Hemorrhage, not elsewhere classified: Secondary | ICD-10-CM | POA: Diagnosis not present

## 2020-02-23 DIAGNOSIS — S0993XA Unspecified injury of face, initial encounter: Secondary | ICD-10-CM | POA: Diagnosis not present

## 2020-02-23 DIAGNOSIS — W19XXXA Unspecified fall, initial encounter: Secondary | ICD-10-CM

## 2020-02-23 DIAGNOSIS — I10 Essential (primary) hypertension: Secondary | ICD-10-CM | POA: Diagnosis not present

## 2020-02-23 DIAGNOSIS — R0902 Hypoxemia: Secondary | ICD-10-CM | POA: Diagnosis not present

## 2020-02-23 DIAGNOSIS — S199XXA Unspecified injury of neck, initial encounter: Secondary | ICD-10-CM | POA: Diagnosis not present

## 2020-02-23 DIAGNOSIS — Z96653 Presence of artificial knee joint, bilateral: Secondary | ICD-10-CM | POA: Insufficient documentation

## 2020-02-23 DIAGNOSIS — Z7982 Long term (current) use of aspirin: Secondary | ICD-10-CM | POA: Diagnosis not present

## 2020-02-23 DIAGNOSIS — S0990XA Unspecified injury of head, initial encounter: Secondary | ICD-10-CM | POA: Diagnosis not present

## 2020-02-23 MED ORDER — OXYMETAZOLINE HCL 0.05 % NA SOLN
1.0000 | Freq: Once | NASAL | Status: AC
  Administered 2020-02-23: 1 via NASAL
  Filled 2020-02-23: qty 30

## 2020-02-23 NOTE — ED Triage Notes (Signed)
Patient brought in via EMS from home. Alert and oriented. Airway patent. Patient fell getting out of bed. Patient states she was trying to move heat pad and got cord caught around legs causing her to fall. . And hit her face, bleeding from nose has stopped upon arrival. Denies any LOC. Denies dizziness. Per patient "slight headache." Patient also states soreness to body.

## 2020-02-23 NOTE — Discharge Instructions (Addendum)
Your CTs today show a nasal bone fracture.  You may apply ice packs on and off to help the swelling in her nose.  Avoid blowing your nose harshly.  You may use 1 spray of Afrin to each nostril twice daily for 3 days then discontinue.  Call the ENT provider listed to arrange a follow-up appointment.  Return to the emergency department if you develop uncontrolled bleeding from your nose.

## 2020-02-25 NOTE — ED Provider Notes (Signed)
[facility] EMERGENCY DEPARTMENT Provider Note   CSN: [id number] Arrival date & time: 02/23/20  1048     History Chief Complaint  Patient presents with  . Fall    Beverly [name] is a 87 y.o. female.  HPI     Beverly [name] is a 87 y.o. female with past medical history of coronary artery disease, COPD, and hypertension who presents to the Emergency Department for evaluation secondary to a mechanical fall.  States that her left foot became tangled in the electrical cord to her heating pad causing her to fall forward onto the floor.  She states that she struck her face on the floor injuring her nose.  She also complains of mild soreness to her right neck.  She had a brief episode of bleeding from her right nostril that spontaneously resolved.  She notes swelling of her right nose with tenderness.  Fall was unwitnessed, but her daughter heard her fall and helped her to get up.  Patient and daughter deny loss of consciousness.  Patient denies headache, dizziness, nausea or vomiting.  No low back pain or hip pain.  She takes an 81 mg aspirin daily but denies  anticoagulants.    Past Medical History:  Diagnosis Date  . Anxiety   . Arthritis   . COPD (chronic obstructive pulmonary disease) (HCC)   . Coronary artery disease    a. STEMI with cardiac arrest (polymorphic VT) s/p DES to RCA.  Marland Kitchen GERD (gastroesophageal reflux disease)   . Headache(784.0)   . Hearing loss, central    Left ear  . Hematoma   . Hypertension   . Memory changes   . Mild aortic stenosis   . Mild carotid artery disease (HCC)    a. 1-39% by duplex 02/2018.  Marland Kitchen Seizures (HCC) 10/2012   No seizures, speech issues; after a fall, hitting head on left side    Patient Active Problem List   Diagnosis Date Noted  . 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 Exudative age-related macular degeneration of left eye with active choroidal neovascularization  (HCC) 05/13/2019  . 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 neovascularization (HCC) 05/13/2019  . Primary open angle glaucoma of both eyes, mild stage 05/13/2019  . Nuclear sclerotic cataract of both eyes 05/13/2019  . Hypokalemia 01/12/2018  . Hypomagnesemia 01/12/2018  . COPD (chronic obstructive pulmonary disease) (HCC) 01/12/2018  . Acute ST elevation myocardial infarction (STEMI) of inferior wall (HCC) 01/10/2018  . Cardiac arrest (HCC)   . Dyslipidemia (high LDL; low HDL)   . 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 History of traumatic subdural hematoma 06/23/2014  . Subdural hematoma (HCC) 10/18/2012  . TIA (transient ischemic attack) 10/18/2012  . HTN (hypertension) 10/18/2012  . Hyponatremia 10/18/2012    Past Surgical History:  Procedure Laterality Date  . ABDOMINAL HYSTERECTOMY    . APPENDECTOMY    . CARDIAC CATHETERIZATION     15 yrs ago  . CORONARY/GRAFT ACUTE MI REVASCULARIZATION N/A 01/10/2018   Procedure: Coronary/Graft Acute MI Revascularization;  Surgeon: Runell Gess, MD;  Location: [facility] INVASIVE CV LAB;  Service: Cardiovascular;  Laterality: N/A;  . CRANIOTOMY Left 10/30/2012   Procedure: CRANIOTOMY HEMATOMA EVACUATION SUBDURAL;  Surgeon: Maeola Harman, MD;  Location: [facility] NEURO ORS;  Service: Neurosurgery;  Laterality: Left;  Left Craniotomy for evacuation of subdural hematoma  . JOINT  REPLACEMENT Bilateral    knees  . LEFT HEART CATH AND CORONARY ANGIOGRAPHY N/A 01/10/2018   Procedure: LEFT HEART CATH AND CORONARY ANGIOGRAPHY;  Surgeon: Runell GessBerry, Jonathan J, MD;  Location: [facility] INVASIVE CV LAB;  Service: Cardiovascular;  Laterality: N/A;     OB History    Gravida      Para      Term      Preterm      AB      Living  2     SAB      IAB      Ectopic      Multiple      Live Births               Family History  Problem Relation Age of Onset  . Cancer Father   . Ataxia Neg Hx   . Chorea Neg Hx   . Dementia Neg Hx   . Mental retardation Neg Hx   . Migraines Neg Hx   . Multiple sclerosis Neg Hx   . Neurofibromatosis Neg Hx   . Neuropathy Neg Hx   . Parkinsonism Neg Hx   . Seizures Neg Hx   . Stroke Neg Hx     Social History   Tobacco Use  . Smoking status: Never Smoker  . Smokeless tobacco: Never Used  Vaping Use  . Vaping Use: Never used  Substance Use Topics  . Alcohol use: No  . Drug use: No    Home Medications Prior to Admission medications   Medication Sig Start Date End Date Taking? Authorizing Provider  acetaminophen (TYLENOL) 500 MG tablet Take 1 tablet (500 mg total) by mouth as needed (no more than 3 gm / day). 12/04/19   Ronney Astersleaver, Jesse M, NP  ALPRAZolam Prudy Feeler(XANAX) 0.5 MG tablet Take 0.5 mg by mouth 3 (three) times daily as needed for anxiety.    [provider]  amLODipine (NORVASC) 5 MG tablet Take 1 tablet (5 mg total) by mouth daily. 05/29/19 12/04/19  Jonelle SidleMcDowell, Samuel G, MD  aspirin 81 MG chewable tablet Chew 1 tablet (81 mg total) by mouth daily. 01/13/18   Filbert SchilderMcDaniel, Jill D, NP  atorvastatin (LIPITOR) 80 MG tablet Take 1 tablet (80 mg total) by mouth daily at 6 PM. 01/12/18   Filbert SchilderMcDaniel, Jill D, NP  carvedilol (COREG) 25 MG tablet Take 25 mg by mouth 2 (two) times daily with a meal.    [provider]  celecoxib (CELEBREX) 200 MG capsule Take 200 mg by mouth daily.    [provider]  cholecalciferol (VITAMIN D) 1000 UNITS tablet Take 1,000 Units by mouth daily.     [provider]  fluticasone (FLONASE) 50 MCG/ACT nasal spray Place 1 spray into both nostrils daily.  09/18/12   [provider]  gabapentin (NEURONTIN) 300 MG capsule Take 300 mg by mouth daily. 04/12/19   [provider]  HYDROcodone-acetaminophen (NORCO) 10-325 MG tablet Take 1 tablet by mouth every 6 (six) hours as needed.    [provider]  latanoprost (XALATAN) 0.005 % ophthalmic solution INSTILL ONE DROP IN BOTH EYES NIGHTLY 07/09/19   Rankin, Alford HighlandGary A, MD  levETIRAcetam (KEPPRA) 500 MG tablet Take 500 mg by mouth 2 (two) times daily.    [provider]  methocarbamol (ROBAXIN) 500 MG tablet Take 1 tablet by mouth in the morning and at bedtime.  02/22/19   [provider]  Multiple Vitamins-Minerals (PRESERVISION/LUTEIN PO) Take 1 tablet by mouth 2 (  two) times daily.    [provider]  nitroGLYCERIN (NITROSTAT) 0.4 MG SL tablet Place 1 tablet (0.4 mg total) under the tongue every 5 (five) minutes x 3 doses as needed for chest pain. 01/12/18   Georgie Chard D, NP  pantoprazole (PROTONIX) 40 MG tablet Take 40 mg by mouth daily.    [provider]  potassium chloride (K-DUR) 10 MEQ tablet Take 10 mEq by mouth daily.     [provider]  Propylene Glycol (SYSTANE BALANCE OP) Place 1-2 drops into both eyes daily as needed (dry eyes).    [provider]  sertraline (ZOLOFT) 100 MG tablet Take 100 mg by mouth daily. 03/29/19   [provider]  lisinopril (ZESTRIL) 20 MG tablet Take 1 tablet (20 mg total) by mouth 2 (two) times daily. 01/24/19 05/03/19  Ellsworth Lennox, PA-C    Allergies    Lisinopril, Eggs or egg-derived products, Morphine and related, and Penicillins  Review of Systems   Review of Systems  Constitutional: Negative for chills, fatigue and fever.  HENT: Positive for facial swelling (swelling to her nose) and nosebleeds. Negative for congestion and trouble swallowing.   Eyes: Negative for pain and visual disturbance.  Respiratory: Negative for shortness of breath.   Cardiovascular: Negative for chest pain and palpitations.  Gastrointestinal: Negative for abdominal pain, nausea and vomiting.  Genitourinary: Negative for dysuria, flank pain and hematuria.  Musculoskeletal: Positive for neck pain. Negative for arthralgias, back pain, myalgias and  neck stiffness.  Skin: Negative for rash and wound.  Neurological: Negative for dizziness, syncope, weakness, numbness and headaches.  Hematological: Does not bruise/bleed easily.  Psychiatric/Behavioral: Negative for confusion.    Physical Exam Updated Vital Signs BP (!) 157/76 (BP Location: Right Arm)   Pulse 78   Resp 18   Ht 4\' 10"  (1.473 m)   Wt 53.1 kg   SpO2 99%   BMI 24.45 kg/m   Physical Exam Vitals and nursing note reviewed.  Constitutional:      Appearance: Normal appearance. She is not ill-appearing.  HENT:     Head:     Comments: No abrasions or hematomas of the scalp or forehead    Right Ear: Ear canal normal. No hemotympanum.     Left Ear: Ear canal normal. No hemotympanum.     Nose: Nasal deformity, signs of injury, nasal tenderness, mucosal edema and congestion present. No septal deviation or laceration.     Right Nostril: No epistaxis or septal hematoma.     Comments: Small amount of blood noted to the right nare.  No active bleeding.  No septal hematoma.  No open wound.  No blood to the posterior oropharynx.    Mouth/Throat:     Dentition: No dental tenderness.     Pharynx: Oropharynx is clear. Uvula midline.     Comments: No dental injury or malocclusion.  No tenderness of the face or mandible. Eyes:     Extraocular Movements: Extraocular movements intact.     Conjunctiva/sclera: Conjunctivae normal.     Pupils: Pupils are equal, round, and reactive to light.  Neck:     Comments: Tenderness to palpation of the right cervical paraspinal muscles. Cardiovascular:     Rate and Rhythm: Normal rate and regular rhythm.     Pulses: Normal pulses.  Pulmonary:     Effort: Pulmonary effort is normal. No respiratory distress.     Breath sounds: Normal breath sounds.  Chest:     Chest wall: No  tenderness.  Abdominal:     Palpations: Abdomen is soft.     Tenderness: There is no abdominal tenderness. There is no guarding.  Musculoskeletal:        General: No  signs of injury.     Cervical back: Normal range of motion. Tenderness present.     Comments: No tenderness of the T or L-spine.  Nontender.  Small abrasion noted to the anterior left knee.  No tenderness with range of motion of the left knee.  Skin:    General: Skin is warm.     Capillary Refill: Capillary refill takes less than 2 seconds.     Findings: No rash.  Neurological:     General: No focal deficit present.     Mental Status: She is alert.     Sensory: No sensory deficit.     Motor: No weakness.     Comments: CN II through XII intact.  Speech clear.     ED Results / Procedures / Treatments   Labs (all labs ordered are listed, but only abnormal results are displayed) Labs Reviewed - No data to display  EKG None  Radiology CT Head Wo Contrast  Result Date: 02/23/2020 CLINICAL DATA:  87 year old female with head, face and neck injury following fall. Initial encounter. EXAM: CT HEAD WITHOUT CONTRAST CT MAXILLOFACIAL WITHOUT CONTRAST CT CERVICAL SPINE WITHOUT CONTRAST TECHNIQUE: Multidetector CT imaging of the head, cervical spine, and maxillofacial structures were performed using the standard protocol without intravenous contrast. Multiplanar CT image reconstructions of the cervical spine and maxillofacial structures were also generated. COMPARISON:  08/18/2019 brain MR, 05/03/2019 head and cervical spine CTs and prior studies FINDINGS: CT HEAD FINDINGS Brain: No evidence of acute infarction, hemorrhage, hydrocephalus, extra-axial collection or mass lesion/mass effect. Chronic small-vessel white matter ischemic changes are again noted. Vascular: Carotid atherosclerotic calcifications are noted. Skull: No acute abnormality.  LEFT craniotomy changes are present. Other: None CT MAXILLOFACIAL FINDINGS Osseous: Equivocal nondisplaced fracture of the RIGHT nasal bone is noted-correlate clinically. No other acute fracture, subluxation or dislocation identified. Orbits: Negative. No traumatic  or inflammatory finding. Sinuses: Clear. Soft tissues: Negative. CT CERVICAL SPINE FINDINGS Alignment: 4 mm anterolisthesis of C3 on 4 and 3 mm anterolisthesis of C7 on T1 are unchanged and compatible with degenerative changes. No new subluxation identified. Skull base and vertebrae: No acute fracture. A cyst at the base of the odontoid is unchanged. Soft tissues and spinal canal: No prevertebral fluid or swelling. No visible canal hematoma. Disc levels: Unchanged moderate to severe multilevel degenerative disc disease, spondylosis and facet arthropathy again noted. Multilevel central spinal narrowing and bony foraminal narrowing is again identified, greatest C3-4. Upper chest: No acute abnormality Other: None IMPRESSION: 1. No evidence of acute intracranial abnormality. Chronic small-vessel white matter ischemic changes. 2. Equivocal nondisplaced RIGHT nasal bone fracture. Correlate clinically. 3. No static evidence of acute injury to the cervical spine. Moderate to severe degenerative changes again noted as described. Electronically Signed   By: Harmon Pier M.D.   On: 02/23/2020 13:28   CT Cervical Spine Wo Contrast  Result Date: 02/23/2020 CLINICAL DATA:  87 year old female with head, face and neck injury following fall. Initial encounter. EXAM: CT HEAD WITHOUT CONTRAST CT MAXILLOFACIAL WITHOUT CONTRAST CT CERVICAL SPINE WITHOUT CONTRAST TECHNIQUE: Multidetector CT imaging of the head, cervical spine, and maxillofacial structures were performed using the standard protocol without intravenous contrast. Multiplanar CT image reconstructions of the cervical spine and maxillofacial structures were also generated. COMPARISON:  08/18/2019 brain  MR, 05/03/2019 head and cervical spine CTs and prior studies FINDINGS: CT HEAD FINDINGS Brain: No evidence of acute infarction, hemorrhage, hydrocephalus, extra-axial collection or mass lesion/mass effect. Chronic small-vessel white matter ischemic changes are again noted.  Vascular: Carotid atherosclerotic calcifications are noted. Skull: No acute abnormality.  LEFT craniotomy changes are present. Other: None CT MAXILLOFACIAL FINDINGS Osseous: Equivocal nondisplaced fracture of the RIGHT nasal bone is noted-correlate clinically. No other acute fracture, subluxation or dislocation identified. Orbits: Negative. No traumatic or inflammatory finding. Sinuses: Clear. Soft tissues: Negative. CT CERVICAL SPINE FINDINGS Alignment: 4 mm anterolisthesis of C3 on 4 and 3 mm anterolisthesis of C7 on T1 are unchanged and compatible with degenerative changes. No new subluxation identified. Skull base and vertebrae: No acute fracture. A cyst at the base of the odontoid is unchanged. Soft tissues and spinal canal: No prevertebral fluid or swelling. No visible canal hematoma. Disc levels: Unchanged moderate to severe multilevel degenerative disc disease, spondylosis and facet arthropathy again noted. Multilevel central spinal narrowing and bony foraminal narrowing is again identified, greatest C3-4. Upper chest: No acute abnormality Other: None IMPRESSION: 1. No evidence of acute intracranial abnormality. Chronic small-vessel white matter ischemic changes. 2. Equivocal nondisplaced RIGHT nasal bone fracture. Correlate clinically. 3. No static evidence of acute injury to the cervical spine. Moderate to severe degenerative changes again noted as described. Electronically Signed   By: Harmon Pier M.D.   On: 02/23/2020 13:28   CT Maxillofacial Wo Contrast  Result Date: 02/23/2020 CLINICAL DATA:  87 year old female with head, face and neck injury following fall. Initial encounter. EXAM: CT HEAD WITHOUT CONTRAST CT MAXILLOFACIAL WITHOUT CONTRAST CT CERVICAL SPINE WITHOUT CONTRAST TECHNIQUE: Multidetector CT imaging of the head, cervical spine, and maxillofacial structures were performed using the standard protocol without intravenous contrast. Multiplanar CT image reconstructions of the cervical spine  and maxillofacial structures were also generated. COMPARISON:  08/18/2019 brain MR, 05/03/2019 head and cervical spine CTs and prior studies FINDINGS: CT HEAD FINDINGS Brain: No evidence of acute infarction, hemorrhage, hydrocephalus, extra-axial collection or mass lesion/mass effect. Chronic small-vessel white matter ischemic changes are again noted. Vascular: Carotid atherosclerotic calcifications are noted. Skull: No acute abnormality.  LEFT craniotomy changes are present. Other: None CT MAXILLOFACIAL FINDINGS Osseous: Equivocal nondisplaced fracture of the RIGHT nasal bone is noted-correlate clinically. No other acute fracture, subluxation or dislocation identified. Orbits: Negative. No traumatic or inflammatory finding. Sinuses: Clear. Soft tissues: Negative. CT CERVICAL SPINE FINDINGS Alignment: 4 mm anterolisthesis of C3 on 4 and 3 mm anterolisthesis of C7 on T1 are unchanged and compatible with degenerative changes. No new subluxation identified. Skull base and vertebrae: No acute fracture. A cyst at the base of the odontoid is unchanged. Soft tissues and spinal canal: No prevertebral fluid or swelling. No visible canal hematoma. Disc levels: Unchanged moderate to severe multilevel degenerative disc disease, spondylosis and facet arthropathy again noted. Multilevel central spinal narrowing and bony foraminal narrowing is again identified, greatest C3-4. Upper chest: No acute abnormality Other: None IMPRESSION: 1. No evidence of acute intracranial abnormality. Chronic small-vessel white matter ischemic changes. 2. Equivocal nondisplaced RIGHT nasal bone fracture. Correlate clinically. 3. No static evidence of acute injury to the cervical spine. Moderate to severe degenerative changes again noted as described. Electronically Signed   By: Harmon Pier M.D.   On: 02/23/2020 13:28     Procedures Procedures   Medications Ordered in ED Medications  oxymetazoline (AFRIN) 0.05 % nasal spray 1 spray (1 spray  Each Nare Given 02/23/20 1408)  ED Course  I have reviewed the triage vital signs and the nursing notes.  Pertinent labs & imaging results that were available during my care of the patient were reviewed by me and considered in my medical decision making (see chart for details).    MDM Rules/Calculators/A&P                          Patient here for evaluation of injury secondary to mechanical fall that occurred shortly before ER arrival.  Patient tripped and fell face forward onto the floor.  No LOC.  Reported headache or dizziness.  She has nasal bone deformity with edema to the right nose.  No epistaxis at present.  No blood to the posterior oropharynx.  No focal neuro deficits.  CT scan of the maxillofacial head and C-spine ordered.  Maxillofacial CT shows nondisplaced right nasal bone fracture.  No other acute findings.  Patient also seen by Dr. Wilkie Aye and care plan discussed.  On recheck, patient resting comfortably, she has been observed in the department without epistaxis.  Given head injury discharge instructions and she agrees to close follow-up with ENT  Final Clinical Impression(s) / ED Diagnoses Final diagnoses:  Closed fracture of nasal bone, initial encounter  Fall in home, initial encounter    Rx / DC Orders ED Discharge Orders    None       Pauline Aus, PA-C 02/25/20 1352    Rozelle Logan, DO 02/26/20 2349

## 2020-02-27 ENCOUNTER — Encounter (INDEPENDENT_AMBULATORY_CARE_PROVIDER_SITE_OTHER): Payer: Medicare Other | Admitting: Ophthalmology

## 2020-03-10 ENCOUNTER — Other Ambulatory Visit: Payer: Self-pay

## 2020-03-10 ENCOUNTER — Ambulatory Visit (INDEPENDENT_AMBULATORY_CARE_PROVIDER_SITE_OTHER): Payer: Medicare Other | Admitting: Ophthalmology

## 2020-03-10 ENCOUNTER — Encounter (INDEPENDENT_AMBULATORY_CARE_PROVIDER_SITE_OTHER): Payer: Self-pay | Admitting: Ophthalmology

## 2020-03-10 DIAGNOSIS — Z79899 Other long term (current) drug therapy: Secondary | ICD-10-CM | POA: Diagnosis not present

## 2020-03-10 DIAGNOSIS — M545 Low back pain, unspecified: Secondary | ICD-10-CM | POA: Diagnosis not present

## 2020-03-10 DIAGNOSIS — H2512 Age-related nuclear cataract, left eye: Secondary | ICD-10-CM

## 2020-03-10 DIAGNOSIS — G894 Chronic pain syndrome: Secondary | ICD-10-CM | POA: Diagnosis not present

## 2020-03-10 DIAGNOSIS — H353221 Exudative age-related macular degeneration, left eye, with active choroidal neovascularization: Secondary | ICD-10-CM | POA: Diagnosis not present

## 2020-03-10 DIAGNOSIS — G8929 Other chronic pain: Secondary | ICD-10-CM | POA: Diagnosis not present

## 2020-03-10 DIAGNOSIS — H353133 Nonexudative age-related macular degeneration, bilateral, advanced atrophic without subfoveal involvement: Secondary | ICD-10-CM | POA: Diagnosis not present

## 2020-03-10 MED ORDER — BEVACIZUMAB 2.5 MG/0.1ML IZ SOSY
2.5000 mg | PREFILLED_SYRINGE | INTRAVITREAL | Status: AC | PRN
  Administered 2020-03-10: 2.5 mg via INTRAVITREAL

## 2020-03-10 NOTE — Assessment & Plan Note (Signed)
OS, residual cataract, limiting factor for acuity will be the active macular degeneration with CNVM.  Nonetheless sensation of visual potentials appropriate as well as and allowing monitoring of her macular condition henceforth

## 2020-03-10 NOTE — Assessment & Plan Note (Signed)

## 2020-03-10 NOTE — Progress Notes (Signed)
03/10/2020     CHIEF COMPLAINT Patient presents for Retina Follow Up (4 Month Wet AMD f\u S. Possible Avastin OS. OCT/Pt states she had cat sx OD. Pt has rescheduled OS sx due to a broken nose. Pt states OD vision has improved since sx.)   HISTORY OF PRESENT ILLNESS: Beverly [name] is a 87 y.o. female who presents to the clinic today for:   HPI    Retina Follow Up    Patient presents with  Wet AMD.  In left eye.  Severity is moderate.  Duration of 4 months.  Since onset it is stable.  I, the attending physician,  performed the HPI with the patient and updated documentation appropriately. Additional comments: 4 Month Wet AMD f\u S. Possible Avastin OS. OCT Pt states she had cat sx OD. Pt has rescheduled OS sx due to a broken nose. Pt states OD vision has improved since sx.       Last edited by [name] on 03/10/2020  1:42 PM. (History)      Referring physician: Benita Stabile, MD [address] [place],  [place] [address]  HISTORICAL INFORMATION:   Selected notes from the MEDICAL RECORD NUMBER    Lab Results  Component Value Date   HGBA1C 6.1 (H) 10/19/2012     CURRENT MEDICATIONS: Current Outpatient Medications (Ophthalmic Drugs)  Medication Sig  . latanoprost (XALATAN) 0.005 % ophthalmic solution INSTILL ONE DROP IN BOTH EYES NIGHTLY  . Propylene Glycol (SYSTANE BALANCE OP) Place 1-2 drops into both eyes daily as needed (dry eyes).   No current facility-administered medications for this visit. (Ophthalmic Drugs)   Current Outpatient Medications (Other)  Medication Sig  . acetaminophen (TYLENOL) 500 MG tablet Take 1 tablet (500 mg total) by mouth as needed (no more than 3 gm / day).  . ALPRAZolam (XANAX) 0.5 MG tablet Take 0.5 mg by mouth 3 (three) times daily as needed for anxiety.  Marland Kitchen amLODipine (NORVASC) 5 MG tablet Take 1 tablet (5 mg total) by mouth daily.  Marland Kitchen aspirin 81 MG chewable tablet Chew 1 tablet (81 mg total) by mouth daily.  Marland Kitchen atorvastatin (LIPITOR)  80 MG tablet Take 1 tablet (80 mg total) by mouth daily at 6 PM.  . carvedilol (COREG) 25 MG tablet Take 25 mg by mouth 2 (two) times daily with a meal.  . celecoxib (CELEBREX) 200 MG capsule Take 200 mg by mouth daily.  . cholecalciferol (VITAMIN D) 1000 UNITS tablet Take 1,000 Units by mouth daily.   . fluticasone (FLONASE) 50 MCG/ACT nasal spray Place 1 spray into both nostrils daily.   Marland Kitchen gabapentin (NEURONTIN) 300 MG capsule Take 300 mg by mouth daily.  Marland Kitchen HYDROcodone-acetaminophen (NORCO) 10-325 MG tablet Take 1 tablet by mouth every 6 (six) hours as needed.  . levETIRAcetam (KEPPRA) 500 MG tablet Take 500 mg by mouth 2 (two) times daily.  . methocarbamol (ROBAXIN) 500 MG tablet Take 1 tablet by mouth in the morning and at bedtime.   . Multiple Vitamins-Minerals (PRESERVISION/LUTEIN PO) Take 1 tablet by mouth 2 (two) times daily.  . nitroGLYCERIN (NITROSTAT) 0.4 MG SL tablet Place 1 tablet (0.4 mg total) under the tongue every 5 (five) minutes x 3 doses as needed for chest pain.  . pantoprazole (PROTONIX) 40 MG tablet Take 40 mg by mouth daily.  . potassium chloride (K-DUR) 10 MEQ tablet Take 10 mEq by mouth daily.   . sertraline (ZOLOFT) 100 MG tablet Take 100 mg by  mouth daily.   No current facility-administered medications for this visit. (Other)      REVIEW OF SYSTEMS:    ALLERGIES Allergies  Allergen Reactions  . Lisinopril Swelling  . Eggs Or Egg-Derived Products Itching    Patient reports that she can eat eggs but does not tolerate egg derived products  . Morphine And Related     Headache  . Penicillins Itching    DID THE REACTION INVOLVE: Swelling of the face/tongue/throat, SOB, or low BP? No Sudden or severe rash/hives, skin peeling, or the inside of the mouth or nose? No Did it require medical treatment? No When did it last happen? Unknown If all above answers are "NO", may proceed with cephalosporin use.     PAST MEDICAL HISTORY Past Medical History:   Diagnosis Date  . Anxiety   . Arthritis   . COPD (chronic obstructive pulmonary disease) (HCC)   . Coronary artery disease    a. STEMI with cardiac arrest (polymorphic VT) s/p DES to RCA.  Marland Kitchen GERD (gastroesophageal reflux disease)   . Headache(784.0)   . Hearing loss, central    Left ear  . Hematoma   . Hypertension   . Memory changes   . Mild aortic stenosis   . Mild carotid artery disease (HCC)    a. 1-39% by duplex 02/2018.  Marland Kitchen Seizures (HCC) 10/2012   No seizures, speech issues; after a fall, hitting head on left side   Past Surgical History:  Procedure Laterality Date  . ABDOMINAL HYSTERECTOMY    . APPENDECTOMY    . CARDIAC CATHETERIZATION     15 yrs ago  . CORONARY/GRAFT ACUTE MI REVASCULARIZATION N/A 01/10/2018   Procedure: Coronary/Graft Acute MI Revascularization;  Surgeon: Runell Gess, MD;  Location: [facility] INVASIVE CV LAB;  Service: Cardiovascular;  Laterality: N/A;  . CRANIOTOMY Left 10/30/2012   Procedure: CRANIOTOMY HEMATOMA EVACUATION SUBDURAL;  Surgeon: Maeola Harman, MD;  Location: [facility] NEURO ORS;  Service: Neurosurgery;  Laterality: Left;  Left Craniotomy for evacuation of subdural hematoma  . JOINT REPLACEMENT Bilateral    knees  . LEFT HEART CATH AND CORONARY ANGIOGRAPHY N/A 01/10/2018   Procedure: LEFT HEART CATH AND CORONARY ANGIOGRAPHY;  Surgeon: Runell Gess, MD;  Location: [facility] INVASIVE CV LAB;  Service: Cardiovascular;  Laterality: N/A;    FAMILY HISTORY Family History  Problem Relation Age of Onset  . Cancer Father   . Ataxia Neg Hx   . Chorea Neg Hx   . Dementia Neg Hx   . Mental retardation Neg Hx   . Migraines Neg Hx   . Multiple sclerosis Neg Hx   . Neurofibromatosis Neg Hx   . Neuropathy Neg Hx   . Parkinsonism Neg Hx   . Seizures Neg Hx   . Stroke Neg Hx     SOCIAL HISTORY Social History   Tobacco Use  . Smoking status: Never Smoker  . Smokeless tobacco: Never Used  Vaping Use  . Vaping Use: Never used  Substance Use Topics  .  Alcohol use: No  . Drug use: No         OPHTHALMIC EXAM: Base Eye Exam    Visual Acuity (Snellen - Linear)      Right Left   Dist cc 20/200 -1 20/200 -1   Dist ph cc 20/100 -2 NI   Correction: Glasses       Tonometry (Tonopen, 1:49 PM)      Right Left   Pressure 13 18  Pupils      Pupils Dark Light Shape React APD   Right PERRL 3 3 Round Minimal None   Left PERRL 3 3 Round Minimal None       Visual Fields (Counting fingers)      Left Right    Full Full       Neuro/Psych    Oriented x3: Yes   Mood/Affect: Normal       Dilation    Left eye: 1.0% Mydriacyl, 2.5% Phenylephrine @ 1:49 PM        Slit Lamp and Fundus Exam    External Exam      Right Left   External Normal Normal       Slit Lamp Exam      Right Left   Lids/Lashes Normal Normal   Conjunctiva/Sclera White and quiet White and quiet   Cornea Clear Clear   Anterior Chamber Deep and quiet Deep and quiet   Iris Round and reactive Round and reactive   Lens Centered posterior chamber intraocular lens 3+ Nuclear sclerosis   Anterior Vitreous Normal Normal       Fundus Exam      Right Left   Posterior Vitreous  Posterior vitreous detachment   Disc  Normal   C/D Ratio  0.3   Macula  Soft drusen,  Pigmented atrophy, with subfoveal white fibrotic disciform scar, no fluid, no hemorrhage,  in wheelchair, 20 diopter lens only   Vessels  Normal   Periphery  Normal          IMAGING AND PROCEDURES  Imaging and Procedures for 03/10/20  OCT, Retina - OU - Both Eyes       Right Eye Quality was good. Scan locations included subfoveal. Central Foveal Thickness: 312. Progression has been stable. Findings include abnormal foveal contour, subretinal hyper-reflective material, intraretinal hyper-reflective material.   Left Eye Quality was good. Scan locations included subfoveal. Central Foveal Thickness: 482. Progression has worsened. Findings include abnormal foveal contour, cystoid macular edema,  disciform scar, subretinal hyper-reflective material, intraretinal fluid.   Notes OD, disciform scar macula right eye with perifoveal CME yet stable overall.  OS with increased intraretinal fluid and active CNVM, at 57-month follow-up.  We will repeat injection left eye today and will need reevaluation in approximately 6 weeks       Intravitreal Injection, Pharmacologic Agent - OS - Left Eye       Time Out 03/10/2020. 2:59 PM. Confirmed correct patient, procedure, site, and patient consented.   Anesthesia Topical anesthesia was used. Anesthetic medications included Akten 3.5%.   Procedure Preparation included Ofloxacin , 10% betadine to eyelids, 5% betadine to ocular surface. A 30 gauge needle was used.   Injection:  2.5 mg Bevacizumab (AVASTIN) 2.5mg /0.32mL SOSY   NDC: 93235-573-22, Lot: [id number]   Route: Intravitreal, Site: Left Eye  Post-op Post injection exam found visual acuity of at least counting fingers. The patient tolerated the procedure well. There were no complications. The patient received written and verbal post procedure care education. Post injection medications were not given.                 ASSESSMENT/PLAN:  Age-related nuclear cataract of left eye OS, residual cataract, limiting factor for acuity will be the active macular degeneration with CNVM.  Nonetheless sensation of visual potentials appropriate as well as and allowing monitoring of her macular condition henceforth  Advanced nonexudative age-related macular degeneration of both eyes without subfoveal involvement The nature of wet macular degeneration  was discussed with the patient.  Forms of therapy reviewed include the use of Anti-VEGF medications injected painlessly into the eye, as well as other possible treatment modalities, including thermal laser therapy. Fellow eye involvement and risks were discussed with the patient. Upon the finding of wet age related macular degeneration, treatment will be  offered. The treatment regimen is on a treat as needed basis with the intent to treat if necessary and extend interval of exams when possible. On average 1 out of 6 patients do not need lifetime therapy. However, the risk of recurrent disease is high for a lifetime.  Initially monthly, then periodic, examinations and evaluations will determine whether the next treatment is required on the day of the examination.      ICD-10-CM   1. Exudative age-related macular degeneration of left eye with active choroidal neovascularization (HCC)  H35.3221 OCT, Retina - OU - Both Eyes    Intravitreal Injection, Pharmacologic Agent - OS - Left Eye    bevacizumab (AVASTIN) SOSY 2.5 mg  2. Age-related nuclear cataract of left eye  H25.12   3. 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  At 5935-month follow-up, disease subfoveal CNVM has activated and worsened in the left eye.  In order to preserve para macular function will repeat injection intravitreal Avastin today and follow-up again in 6 weeks dilate left eye  2.  OD overall stable  3.  Of Dr. Alden HippGrote as scheduled considering cataract surgery left eye  Ophthalmic Meds Ordered this visit:  Meds ordered this encounter  Medications  . bevacizumab (AVASTIN) SOSY 2.5 mg       Return in about 6 weeks (around 04/21/2020) for dilate, OS, AVASTIN OCT.  There are no Patient Instructions on file for this visit.   Explained the diagnoses, plan, and follow up with the patient and they expressed understanding.  Patient expressed understanding of the importance of proper follow up care.   Alford HighlandGary A. Ilsa Bonello M.D. Diseases & Surgery of the Retina and Vitreous [facility] 03/10/20     Abbreviations: M myopia (nearsighted); A astigmatism; H hyperopia (farsighted); P presbyopia; Mrx spectacle prescription;  CTL contact lenses; OD right eye; OS left eye; OU both eyes  XT exotropia;  ET esotropia; PEK punctate epithelial keratitis; PEE punctate epithelial erosions; DES dry eye syndrome; MGD meibomian gland dysfunction; ATs artificial tears; PFAT's preservative free artificial tears; NSC nuclear sclerotic cataract; PSC posterior subcapsular cataract; ERM epi-retinal membrane; PVD posterior vitreous detachment; RD retinal detachment; DM diabetes mellitus; DR diabetic retinopathy; NPDR non-proliferative diabetic retinopathy; PDR proliferative diabetic retinopathy; CSME clinically significant macular edema; DME diabetic macular edema; dbh dot blot hemorrhages; CWS cotton wool spot; POAG primary open angle glaucoma; C/D cup-to-disc ratio; HVF humphrey visual field; GVF goldmann visual field; OCT optical coherence tomography; IOP intraocular pressure; BRVO Branch retinal vein occlusion; CRVO central retinal vein occlusion; CRAO central retinal artery occlusion; BRAO branch retinal artery occlusion; RT retinal tear; SB scleral buckle; PPV pars plana vitrectomy; VH Vitreous hemorrhage; PRP panretinal laser photocoagulation; IVK intravitreal kenalog; VMT vitreomacular traction; MH Macular hole;  NVD neovascularization of the disc; NVE neovascularization elsewhere; AREDS age related eye disease study; ARMD age related macular degeneration; POAG primary open angle glaucoma; EBMD epithelial/anterior basement membrane dystrophy; ACIOL anterior chamber intraocular lens; IOL intraocular lens; PCIOL posterior chamber intraocular lens; Phaco/IOL phacoemulsification with intraocular lens placement; PRK photorefractive keratectomy; LASIK laser assisted in situ keratomileusis; HTN hypertension; DM  diabetes mellitus; COPD chronic obstructive pulmonary disease

## 2020-04-10 ENCOUNTER — Other Ambulatory Visit: Payer: Self-pay | Admitting: Cardiology

## 2020-04-14 DIAGNOSIS — G8929 Other chronic pain: Secondary | ICD-10-CM | POA: Diagnosis not present

## 2020-04-14 DIAGNOSIS — G894 Chronic pain syndrome: Secondary | ICD-10-CM | POA: Diagnosis not present

## 2020-04-14 DIAGNOSIS — Z79899 Other long term (current) drug therapy: Secondary | ICD-10-CM | POA: Diagnosis not present

## 2020-04-14 DIAGNOSIS — M545 Low back pain, unspecified: Secondary | ICD-10-CM | POA: Diagnosis not present

## 2020-04-20 DIAGNOSIS — K580 Irritable bowel syndrome with diarrhea: Secondary | ICD-10-CM | POA: Diagnosis not present

## 2020-04-21 ENCOUNTER — Encounter (INDEPENDENT_AMBULATORY_CARE_PROVIDER_SITE_OTHER): Payer: Medicare Other | Admitting: Ophthalmology

## 2020-04-23 ENCOUNTER — Other Ambulatory Visit (INDEPENDENT_AMBULATORY_CARE_PROVIDER_SITE_OTHER): Payer: Self-pay | Admitting: Ophthalmology

## 2020-04-30 DIAGNOSIS — E860 Dehydration: Secondary | ICD-10-CM | POA: Diagnosis not present

## 2020-04-30 DIAGNOSIS — K580 Irritable bowel syndrome with diarrhea: Secondary | ICD-10-CM | POA: Diagnosis not present

## 2020-05-01 ENCOUNTER — Encounter (HOSPITAL_COMMUNITY): Payer: Self-pay | Admitting: Internal Medicine

## 2020-05-01 ENCOUNTER — Inpatient Hospital Stay (HOSPITAL_COMMUNITY)
Admission: EM | Admit: 2020-05-01 | Discharge: 2020-05-09 | DRG: 871 | Disposition: A | Discharge status: Hospice | Payer: Medicare Other | Attending: Internal Medicine | Admitting: Internal Medicine

## 2020-05-01 ENCOUNTER — Observation Stay (HOSPITAL_COMMUNITY): Payer: Medicare Other

## 2020-05-01 ENCOUNTER — Other Ambulatory Visit: Payer: Self-pay

## 2020-05-01 ENCOUNTER — Emergency Department (HOSPITAL_COMMUNITY): Payer: Medicare Other

## 2020-05-01 DIAGNOSIS — M160 Bilateral primary osteoarthritis of hip: Secondary | ICD-10-CM | POA: Diagnosis not present

## 2020-05-01 DIAGNOSIS — Z91012 Allergy to eggs: Secondary | ICD-10-CM

## 2020-05-01 DIAGNOSIS — D500 Iron deficiency anemia secondary to blood loss (chronic): Secondary | ICD-10-CM | POA: Diagnosis not present

## 2020-05-01 DIAGNOSIS — Z955 Presence of coronary angioplasty implant and graft: Secondary | ICD-10-CM

## 2020-05-01 DIAGNOSIS — G40109 Localization-related (focal) (partial) symptomatic epilepsy and epileptic syndromes with simple partial seizures, not intractable, without status epilepticus: Secondary | ICD-10-CM

## 2020-05-01 DIAGNOSIS — K219 Gastro-esophageal reflux disease without esophagitis: Secondary | ICD-10-CM | POA: Diagnosis present

## 2020-05-01 DIAGNOSIS — R0689 Other abnormalities of breathing: Secondary | ICD-10-CM | POA: Diagnosis not present

## 2020-05-01 DIAGNOSIS — R918 Other nonspecific abnormal finding of lung field: Secondary | ICD-10-CM | POA: Diagnosis present

## 2020-05-01 DIAGNOSIS — F0390 Unspecified dementia without behavioral disturbance: Secondary | ICD-10-CM | POA: Diagnosis present

## 2020-05-01 DIAGNOSIS — M25521 Pain in right elbow: Secondary | ICD-10-CM

## 2020-05-01 DIAGNOSIS — N179 Acute kidney failure, unspecified: Secondary | ICD-10-CM | POA: Diagnosis not present

## 2020-05-01 DIAGNOSIS — D649 Anemia, unspecified: Secondary | ICD-10-CM | POA: Diagnosis present

## 2020-05-01 DIAGNOSIS — K529 Noninfective gastroenteritis and colitis, unspecified: Secondary | ICD-10-CM

## 2020-05-01 DIAGNOSIS — J181 Lobar pneumonia, unspecified organism: Secondary | ICD-10-CM

## 2020-05-01 DIAGNOSIS — M25531 Pain in right wrist: Secondary | ICD-10-CM | POA: Diagnosis not present

## 2020-05-01 DIAGNOSIS — E785 Hyperlipidemia, unspecified: Secondary | ICD-10-CM | POA: Diagnosis not present

## 2020-05-01 DIAGNOSIS — Z515 Encounter for palliative care: Secondary | ICD-10-CM | POA: Diagnosis not present

## 2020-05-01 DIAGNOSIS — K921 Melena: Secondary | ICD-10-CM | POA: Diagnosis not present

## 2020-05-01 DIAGNOSIS — J90 Pleural effusion, not elsewhere classified: Secondary | ICD-10-CM

## 2020-05-01 DIAGNOSIS — Z90710 Acquired absence of both cervix and uterus: Secondary | ICD-10-CM

## 2020-05-01 DIAGNOSIS — M1990 Unspecified osteoarthritis, unspecified site: Secondary | ICD-10-CM | POA: Diagnosis present

## 2020-05-01 DIAGNOSIS — N390 Urinary tract infection, site not specified: Secondary | ICD-10-CM

## 2020-05-01 DIAGNOSIS — R197 Diarrhea, unspecified: Secondary | ICD-10-CM | POA: Diagnosis not present

## 2020-05-01 DIAGNOSIS — S2222XA Fracture of body of sternum, initial encounter for closed fracture: Secondary | ICD-10-CM | POA: Diagnosis not present

## 2020-05-01 DIAGNOSIS — Z79899 Other long term (current) drug therapy: Secondary | ICD-10-CM

## 2020-05-01 DIAGNOSIS — I252 Old myocardial infarction: Secondary | ICD-10-CM | POA: Diagnosis not present

## 2020-05-01 DIAGNOSIS — J189 Pneumonia, unspecified organism: Secondary | ICD-10-CM | POA: Diagnosis present

## 2020-05-01 DIAGNOSIS — D509 Iron deficiency anemia, unspecified: Secondary | ICD-10-CM | POA: Diagnosis not present

## 2020-05-01 DIAGNOSIS — Z96653 Presence of artificial knee joint, bilateral: Secondary | ICD-10-CM | POA: Diagnosis present

## 2020-05-01 DIAGNOSIS — Z6826 Body mass index (BMI) 26.0-26.9, adult: Secondary | ICD-10-CM

## 2020-05-01 DIAGNOSIS — K449 Diaphragmatic hernia without obstruction or gangrene: Secondary | ICD-10-CM | POA: Diagnosis not present

## 2020-05-01 DIAGNOSIS — R0602 Shortness of breath: Secondary | ICD-10-CM

## 2020-05-01 DIAGNOSIS — Z20822 Contact with and (suspected) exposure to covid-19: Secondary | ICD-10-CM | POA: Diagnosis not present

## 2020-05-01 DIAGNOSIS — R404 Transient alteration of awareness: Secondary | ICD-10-CM | POA: Diagnosis not present

## 2020-05-01 DIAGNOSIS — H401131 Primary open-angle glaucoma, bilateral, mild stage: Secondary | ICD-10-CM | POA: Diagnosis present

## 2020-05-01 DIAGNOSIS — Z8673 Personal history of transient ischemic attack (TIA), and cerebral infarction without residual deficits: Secondary | ICD-10-CM

## 2020-05-01 DIAGNOSIS — R509 Fever, unspecified: Secondary | ICD-10-CM | POA: Diagnosis not present

## 2020-05-01 DIAGNOSIS — I2510 Atherosclerotic heart disease of native coronary artery without angina pectoris: Secondary | ICD-10-CM | POA: Diagnosis not present

## 2020-05-01 DIAGNOSIS — Z7982 Long term (current) use of aspirin: Secondary | ICD-10-CM

## 2020-05-01 DIAGNOSIS — R6510 Systemic inflammatory response syndrome (SIRS) of non-infectious origin without acute organ dysfunction: Secondary | ICD-10-CM

## 2020-05-01 DIAGNOSIS — J440 Chronic obstructive pulmonary disease with acute lower respiratory infection: Secondary | ICD-10-CM | POA: Diagnosis not present

## 2020-05-01 DIAGNOSIS — M109 Gout, unspecified: Secondary | ICD-10-CM | POA: Diagnosis present

## 2020-05-01 DIAGNOSIS — M25539 Pain in unspecified wrist: Secondary | ICD-10-CM

## 2020-05-01 DIAGNOSIS — B9620 Unspecified Escherichia coli [E. coli] as the cause of diseases classified elsewhere: Secondary | ICD-10-CM | POA: Diagnosis present

## 2020-05-01 DIAGNOSIS — J9811 Atelectasis: Secondary | ICD-10-CM | POA: Diagnosis not present

## 2020-05-01 DIAGNOSIS — E871 Hypo-osmolality and hyponatremia: Secondary | ICD-10-CM | POA: Diagnosis present

## 2020-05-01 DIAGNOSIS — R531 Weakness: Secondary | ICD-10-CM | POA: Diagnosis not present

## 2020-05-01 DIAGNOSIS — R195 Other fecal abnormalities: Secondary | ICD-10-CM | POA: Diagnosis not present

## 2020-05-01 DIAGNOSIS — I10 Essential (primary) hypertension: Secondary | ICD-10-CM | POA: Diagnosis not present

## 2020-05-01 DIAGNOSIS — Z7189 Other specified counseling: Secondary | ICD-10-CM | POA: Diagnosis not present

## 2020-05-01 DIAGNOSIS — Z743 Need for continuous supervision: Secondary | ICD-10-CM | POA: Diagnosis not present

## 2020-05-01 DIAGNOSIS — R64 Cachexia: Secondary | ICD-10-CM | POA: Diagnosis present

## 2020-05-01 DIAGNOSIS — E876 Hypokalemia: Secondary | ICD-10-CM | POA: Diagnosis present

## 2020-05-01 DIAGNOSIS — M19011 Primary osteoarthritis, right shoulder: Secondary | ICD-10-CM | POA: Diagnosis not present

## 2020-05-01 DIAGNOSIS — Z8674 Personal history of sudden cardiac arrest: Secondary | ICD-10-CM

## 2020-05-01 DIAGNOSIS — H353133 Nonexudative age-related macular degeneration, bilateral, advanced atrophic without subfoveal involvement: Secondary | ICD-10-CM | POA: Diagnosis not present

## 2020-05-01 DIAGNOSIS — F419 Anxiety disorder, unspecified: Secondary | ICD-10-CM | POA: Diagnosis present

## 2020-05-01 DIAGNOSIS — Z66 Do not resuscitate: Secondary | ICD-10-CM | POA: Diagnosis present

## 2020-05-01 DIAGNOSIS — Z880 Allergy status to penicillin: Secondary | ICD-10-CM

## 2020-05-01 DIAGNOSIS — Z885 Allergy status to narcotic agent status: Secondary | ICD-10-CM

## 2020-05-01 DIAGNOSIS — H918X2 Other specified hearing loss, left ear: Secondary | ICD-10-CM | POA: Diagnosis present

## 2020-05-01 DIAGNOSIS — Z7401 Bed confinement status: Secondary | ICD-10-CM | POA: Diagnosis not present

## 2020-05-01 DIAGNOSIS — W19XXXA Unspecified fall, initial encounter: Secondary | ICD-10-CM

## 2020-05-01 DIAGNOSIS — R6520 Severe sepsis without septic shock: Secondary | ICD-10-CM | POA: Diagnosis not present

## 2020-05-01 DIAGNOSIS — M7989 Other specified soft tissue disorders: Secondary | ICD-10-CM | POA: Diagnosis not present

## 2020-05-01 DIAGNOSIS — Z888 Allergy status to other drugs, medicaments and biological substances status: Secondary | ICD-10-CM

## 2020-05-01 DIAGNOSIS — J984 Other disorders of lung: Secondary | ICD-10-CM | POA: Diagnosis not present

## 2020-05-01 DIAGNOSIS — K828 Other specified diseases of gallbladder: Secondary | ICD-10-CM | POA: Diagnosis not present

## 2020-05-01 DIAGNOSIS — J9601 Acute respiratory failure with hypoxia: Secondary | ICD-10-CM | POA: Diagnosis not present

## 2020-05-01 DIAGNOSIS — M79601 Pain in right arm: Secondary | ICD-10-CM | POA: Diagnosis not present

## 2020-05-01 DIAGNOSIS — A419 Sepsis, unspecified organism: Secondary | ICD-10-CM | POA: Diagnosis not present

## 2020-05-01 DIAGNOSIS — M79621 Pain in right upper arm: Secondary | ICD-10-CM | POA: Diagnosis not present

## 2020-05-01 DIAGNOSIS — Z22322 Carrier or suspected carrier of Methicillin resistant Staphylococcus aureus: Secondary | ICD-10-CM

## 2020-05-01 LAB — RESP PANEL BY RT-PCR (FLU A&B, COVID) ARPGX2
Influenza A by PCR: NEGATIVE
Influenza B by PCR: NEGATIVE
SARS Coronavirus 2 by RT PCR: NEGATIVE

## 2020-05-01 LAB — COMPREHENSIVE METABOLIC PANEL
ALT: 10 U/L (ref 0–44)
AST: 14 U/L — ABNORMAL LOW (ref 15–41)
Albumin: 2.1 g/dL — ABNORMAL LOW (ref 3.5–5.0)
Alkaline Phosphatase: 129 U/L — ABNORMAL HIGH (ref 38–126)
Anion gap: 7 (ref 5–15)
BUN: 31 mg/dL — ABNORMAL HIGH (ref 8–23)
CO2: 26 mmol/L (ref 22–32)
Calcium: 8.5 mg/dL — ABNORMAL LOW (ref 8.9–10.3)
Chloride: 98 mmol/L (ref 98–111)
Creatinine, Ser: 0.63 mg/dL (ref 0.44–1.00)
GFR, Estimated: >60 mL/min (ref >60)
Glucose, Bld: 109 mg/dL — ABNORMAL HIGH (ref 70–99)
Potassium: 4.8 mmol/L (ref 3.5–5.1)
Sodium: 131 mmol/L — ABNORMAL LOW (ref 135–145)
Total Bilirubin: 0.6 mg/dL (ref 0.3–1.2)
Total Protein: 6.5 g/dL (ref 6.5–8.1)

## 2020-05-01 LAB — CBC WITH DIFFERENTIAL/PLATELET
Abs Immature Granulocytes: 0.56 10*3/uL — ABNORMAL HIGH (ref 0.00–0.07)
Abs Immature Granulocytes: 0.53 10*3/uL — ABNORMAL HIGH (ref 0.00–0.07)
Basophils Absolute: 0.1 10*3/uL (ref 0.0–0.1)
Basophils Absolute: 0 10*3/uL (ref 0.0–0.1)
Basophils Relative: 0 %
Basophils Relative: 0 %
Eosinophils Absolute: 0 10*3/uL (ref 0.0–0.5)
Eosinophils Absolute: 0 10*3/uL (ref 0.0–0.5)
Eosinophils Relative: 0 %
Eosinophils Relative: 0 %
HCT: 26 % — ABNORMAL LOW (ref 36.0–46.0)
HCT: 22.8 % — ABNORMAL LOW (ref 36.0–46.0)
Hemoglobin: 7.8 g/dL — ABNORMAL LOW (ref 12.0–15.0)
Hemoglobin: 7 g/dL — ABNORMAL LOW (ref 12.0–15.0)
Immature Granulocytes: 2 %
Immature Granulocytes: 2 %
Lymphocytes Relative: 4 %
Lymphocytes Relative: 6 %
Lymphs Abs: 1.3 10*3/uL (ref 0.7–4.0)
Lymphs Abs: 1.3 10*3/uL (ref 0.7–4.0)
MCH: 24.1 pg — ABNORMAL LOW (ref 26.0–34.0)
MCH: 24.4 pg — ABNORMAL LOW (ref 26.0–34.0)
MCHC: 30 g/dL (ref 30.0–36.0)
MCHC: 30.7 g/dL (ref 30.0–36.0)
MCV: 80.5 fL (ref 80.0–100.0)
MCV: 79.4 fL — ABNORMAL LOW (ref 80.0–100.0)
Monocytes Absolute: 2 10*3/uL — ABNORMAL HIGH (ref 0.1–1.0)
Monocytes Absolute: 1.6 10*3/uL — ABNORMAL HIGH (ref 0.1–1.0)
Monocytes Relative: 7 %
Monocytes Relative: 7 %
Neutro Abs: 25.1 10*3/uL — ABNORMAL HIGH (ref 1.7–7.7)
Neutro Abs: 19.5 10*3/uL — ABNORMAL HIGH (ref 1.7–7.7)
Neutrophils Relative %: 87 %
Neutrophils Relative %: 85 %
Platelets: 522 10*3/uL — ABNORMAL HIGH (ref 150–400)
Platelets: 505 10*3/uL — ABNORMAL HIGH (ref 150–400)
RBC: 3.23 MIL/uL — ABNORMAL LOW (ref 3.87–5.11)
RBC: 2.87 MIL/uL — ABNORMAL LOW (ref 3.87–5.11)
RDW: 18.9 % — ABNORMAL HIGH (ref 11.5–15.5)
RDW: 18.9 % — ABNORMAL HIGH (ref 11.5–15.5)
WBC: 29.1 10*3/uL — ABNORMAL HIGH (ref 4.0–10.5)
WBC: 23 10*3/uL — ABNORMAL HIGH (ref 4.0–10.5)
nRBC: 0 % (ref 0.0–0.2)
nRBC: 0 % (ref 0.0–0.2)

## 2020-05-01 LAB — FOLATE: Folate: 13.7 ng/mL (ref >5.9)

## 2020-05-01 LAB — URINALYSIS, ROUTINE W REFLEX MICROSCOPIC
Bilirubin Urine: NEGATIVE
Glucose, UA: NEGATIVE mg/dL
Hgb urine dipstick: NEGATIVE
Ketones, ur: NEGATIVE mg/dL
Nitrite: NEGATIVE
Protein, ur: 30 mg/dL — AB
Specific Gravity, Urine: 1.019 (ref 1.005–1.030)
pH: 5 (ref 5.0–8.0)

## 2020-05-01 LAB — IRON AND TIBC
Iron: <5 ug/dL — ABNORMAL LOW (ref 28–170)
Saturation Ratios: UNDETERMINED % (ref 10.4–31.8)
TIBC: UNDETERMINED ug/dL (ref 250–450)
UIBC: UNDETERMINED ug/dL

## 2020-05-01 LAB — APTT: aPTT: 42 seconds — ABNORMAL HIGH (ref 24–36)

## 2020-05-01 LAB — LACTIC ACID, PLASMA: Lactic Acid, Venous: 1.1 mmol/L (ref 0.5–1.9)

## 2020-05-01 LAB — PROTIME-INR
INR: 1.1 (ref 0.8–1.2)
Prothrombin Time: 14.4 seconds (ref 11.4–15.2)

## 2020-05-01 LAB — RETICULOCYTES
Immature Retic Fract: 16.4 % — ABNORMAL HIGH (ref 2.3–15.9)
RBC.: 2.87 MIL/uL — ABNORMAL LOW (ref 3.87–5.11)
Retic Count, Absolute: 32.1 10*3/uL (ref 19.0–186.0)
Retic Ct Pct: 1.1 % (ref 0.4–3.1)

## 2020-05-01 LAB — FERRITIN: Ferritin: 70 ng/mL (ref 11–307)

## 2020-05-01 LAB — VITAMIN B12: Vitamin B-12: 1840 pg/mL — ABNORMAL HIGH (ref 180–914)

## 2020-05-01 LAB — POC OCCULT BLOOD, ED: Fecal Occult Bld: POSITIVE — AB

## 2020-05-01 MED ORDER — SODIUM CHLORIDE 0.9 % IV SOLN: INTRAVENOUS | Status: DC

## 2020-05-01 MED ORDER — AZITHROMYCIN 500 MG IV SOLR
500.0000 mg | INTRAVENOUS | Status: AC
  Administered 2020-05-01 – 2020-05-05 (×5): 500 mg via INTRAVENOUS
  Filled 2020-05-01 (×5): qty 500

## 2020-05-01 MED ORDER — ALBUTEROL SULFATE (2.5 MG/3ML) 0.083% IN NEBU: 2.5000 mg | INHALATION_SOLUTION | RESPIRATORY_TRACT | Status: DC | PRN

## 2020-05-01 MED ORDER — LATANOPROST 0.005 % OP SOLN
1.0000 [drp] | Freq: Every day | OPHTHALMIC | Status: DC
  Administered 2020-05-02 – 2020-05-08 (×6): 1 [drp] via OPHTHALMIC
  Filled 2020-05-01 (×2): qty 2.5

## 2020-05-01 MED ORDER — SODIUM CHLORIDE 0.9 % IV SOLN
2.0000 g | INTRAVENOUS | Status: DC
  Administered 2020-05-01: 2 g via INTRAVENOUS
  Filled 2020-05-01: qty 20

## 2020-05-01 MED ORDER — HYDROCODONE-ACETAMINOPHEN 10-325 MG PO TABS
1.0000 | ORAL_TABLET | Freq: Four times a day (QID) | ORAL | Status: DC | PRN
  Administered 2020-05-01 – 2020-05-09 (×25): 1 via ORAL
  Filled 2020-05-01 (×29): qty 1

## 2020-05-01 MED ORDER — ONDANSETRON HCL 4 MG/2ML IJ SOLN: 4.0000 mg | Freq: Four times a day (QID) | INTRAMUSCULAR | Status: DC | PRN

## 2020-05-01 MED ORDER — ACETAMINOPHEN 325 MG PO TABS
650.0000 mg | ORAL_TABLET | Freq: Four times a day (QID) | ORAL | Status: DC | PRN
  Administered 2020-05-03 – 2020-05-09 (×9): 650 mg via ORAL
  Filled 2020-05-01 (×10): qty 2

## 2020-05-01 MED ORDER — PANTOPRAZOLE SODIUM 40 MG IV SOLR
40.0000 mg | Freq: Two times a day (BID) | INTRAVENOUS | Status: DC
  Administered 2020-05-01 – 2020-05-09 (×16): 40 mg via INTRAVENOUS
  Filled 2020-05-01 (×16): qty 40

## 2020-05-01 MED ORDER — GUAIFENESIN ER 600 MG PO TB12
600.0000 mg | ORAL_TABLET | Freq: Two times a day (BID) | ORAL | Status: DC
  Administered 2020-05-02 – 2020-05-09 (×15): 600 mg via ORAL
  Filled 2020-05-01 (×16): qty 1

## 2020-05-01 MED ORDER — ACETAMINOPHEN 650 MG RE SUPP: 650.0000 mg | Freq: Four times a day (QID) | RECTAL | Status: DC | PRN

## 2020-05-01 MED ORDER — ONDANSETRON HCL 4 MG PO TABS: 4.0000 mg | ORAL_TABLET | Freq: Four times a day (QID) | ORAL | Status: DC | PRN

## 2020-05-01 MED ORDER — LEVETIRACETAM 500 MG PO TABS
500.0000 mg | ORAL_TABLET | Freq: Two times a day (BID) | ORAL | Status: DC
  Administered 2020-05-02 – 2020-05-09 (×15): 500 mg via ORAL
  Filled 2020-05-01 (×16): qty 1

## 2020-05-01 MED ORDER — SODIUM CHLORIDE 0.9 % IV SOLN
1.0000 g | Freq: Once | INTRAVENOUS | Status: AC
  Administered 2020-05-01: 1 g via INTRAVENOUS
  Filled 2020-05-01: qty 10

## 2020-05-01 MED ORDER — IOHEXOL 9 MG/ML PO SOLN
ORAL | Status: AC
  Filled 2020-05-01: qty 1000

## 2020-05-01 MED ORDER — ACETAMINOPHEN 325 MG PO TABS
650.0000 mg | ORAL_TABLET | Freq: Four times a day (QID) | ORAL | Status: DC | PRN
  Administered 2020-05-01: 650 mg via ORAL
  Filled 2020-05-01: qty 2

## 2020-05-01 MED ORDER — SODIUM CHLORIDE 0.9 % IV SOLN
500.0000 mg | Freq: Once | INTRAVENOUS | Status: AC
  Administered 2020-05-01: 500 mg via INTRAVENOUS
  Filled 2020-05-01: qty 500

## 2020-05-01 MED ORDER — RISPERIDONE 0.5 MG PO TABS
0.5000 mg | ORAL_TABLET | Freq: Two times a day (BID) | ORAL | Status: DC
  Administered 2020-05-01 – 2020-05-09 (×16): 0.5 mg via ORAL
  Filled 2020-05-01 (×16): qty 1

## 2020-05-01 MED ORDER — LACTATED RINGERS IV BOLUS (SEPSIS)
1000.0000 mL | Freq: Once | INTRAVENOUS | Status: AC
  Administered 2020-05-01: 1000 mL via INTRAVENOUS

## 2020-05-01 NOTE — H&P (Signed)
History and Physical    Beverly [name] PYP:[id number] DOB: Jan 02, 1933 DOA: 05/01/2020  PCP: Benita Stabile, MD  Patient coming from: home  I have personally briefly reviewed patient's old medical records in [facility]  Chief Complaint: fever, cough  HPI: Beverly [name] is a 87 y.o. female with medical history significant of seizure disorder, hypertension, hyperlipidemia, dementia, is brought to the hospital for generalized weakness.  Patient's granddaughter is at bedside and provides history.  Patient has been having intermittent fevers for the past 2 weeks.  She had associated cough.  They have not noticed any significant shortness of breath.  She has had decreased p.o. intake and progressive generalized weakness.  She normally ambulates with a walker.  Due to her progressive weakness, they have had difficulty getting her out of bed.  She had gone to her primary care physician today who gave her some IV fluids.  Unfortunately, her symptoms not improved.  Today she was noted to be less responsive.  There has not been any melena, hematochezia.  No vomiting.  No abdominal pain.  Family does note that she is normally constipated, but has had diarrhea over the past 2 weeks.  ED Course: She is noted to be mildly tachycardic, blood pressure is otherwise stable.  Chest x-ray indicated dense right lower lobe consolidation.  Recommendations for CT chest to rule out underlying neoplasm.  She was noted to have elevated WBC count of 29,000.  Renal function was otherwise normal.  Hemoglobin was low at 7.8 and she was heme positive stools.  Review of Systems: Unable to assess due to mental status   Past Medical History:  Diagnosis Date  . Anxiety   . Arthritis   . COPD (chronic obstructive pulmonary disease) (HCC)   . Coronary artery disease    a. STEMI with cardiac arrest (polymorphic VT) s/p DES to RCA.  Marland Kitchen GERD (gastroesophageal reflux disease)   . Headache(784.0)   . Hearing loss, central    Left  ear  . Hematoma   . Hypertension   . Memory changes   . Mild aortic stenosis   . Mild carotid artery disease (HCC)    a. 1-39% by duplex 02/2018.  Marland Kitchen Seizures (HCC) 10/2012   No seizures, speech issues; after a fall, hitting head on left side    Past Surgical History:  Procedure Laterality Date  . ABDOMINAL HYSTERECTOMY    . APPENDECTOMY    . CARDIAC CATHETERIZATION     15 yrs ago  . CORONARY/GRAFT ACUTE MI REVASCULARIZATION N/A 01/10/2018   Procedure: Coronary/Graft Acute MI Revascularization;  Surgeon: Runell Gess, MD;  Location: [facility] INVASIVE CV LAB;  Service: Cardiovascular;  Laterality: N/A;  . CRANIOTOMY Left 10/30/2012   Procedure: CRANIOTOMY HEMATOMA EVACUATION SUBDURAL;  Surgeon: Maeola Harman, MD;  Location: [facility] NEURO ORS;  Service: Neurosurgery;  Laterality: Left;  Left Craniotomy for evacuation of subdural hematoma  . JOINT REPLACEMENT Bilateral    knees  . LEFT HEART CATH AND CORONARY ANGIOGRAPHY N/A 01/10/2018   Procedure: LEFT HEART CATH AND CORONARY ANGIOGRAPHY;  Surgeon: Runell Gess, MD;  Location: [facility] INVASIVE CV LAB;  Service: Cardiovascular;  Laterality: N/A;    Social History:  reports that she has never smoked. She has never used smokeless tobacco. She reports that she does not drink alcohol and does not use drugs.  Allergies  Allergen Reactions  . Lisinopril Swelling  . Eggs Or Egg-Derived Products Itching    Patient reports that she can  eat eggs but does not tolerate egg derived products  . Morphine And Related     Headache  . Penicillins Itching    DID THE REACTION INVOLVE: Swelling of the face/tongue/throat, SOB, or low BP? No Sudden or severe rash/hives, skin peeling, or the inside of the mouth or nose? No Did it require medical treatment? No When did it last happen? Unknown If all above answers are "NO", may proceed with cephalosporin use.     Family History  Problem Relation Age of Onset  . Cancer Father   . Ataxia Neg Hx   . Chorea Neg  Hx   . Dementia Neg Hx   . Mental retardation Neg Hx   . Migraines Neg Hx   . Multiple sclerosis Neg Hx   . Neurofibromatosis Neg Hx   . Neuropathy Neg Hx   . Parkinsonism Neg Hx   . Seizures Neg Hx   . Stroke Neg Hx     Prior to Admission medications   Medication Sig Start Date End Date Taking? Authorizing Provider  acetaminophen (TYLENOL) 500 MG tablet Take 1 tablet (500 mg total) by mouth as needed (no more than 3 gm / day). 12/04/19  Yes Cleaver, Thomasene Ripple, NP  ALPRAZolam Prudy Feeler) 0.5 MG tablet Take 0.5 mg by mouth 3 (three) times daily as needed for anxiety.   Yes [provider]  aspirin 81 MG chewable tablet Chew 1 tablet (81 mg total) by mouth daily. 01/13/18  Yes Georgie Chard D, NP  atorvastatin (LIPITOR) 80 MG tablet Take 1 tablet (80 mg total) by mouth daily at 6 PM. 01/12/18  Yes Georgie Chard D, NP  carvedilol (COREG) 6.25 MG tablet TAKE ONE TABLET (6.25MG  TOTAL) BY MOUTH TWO TIMES DAILY 04/10/20  Yes Jonelle Sidle, MD  celecoxib (CELEBREX) 200 MG capsule Take 200 mg by mouth daily.   Yes [provider]  cholecalciferol (VITAMIN D) 1000 UNITS tablet Take 1,000 Units by mouth daily.    Yes [provider]  fluticasone (FLONASE) 50 MCG/ACT nasal spray Place 1 spray into both nostrils daily.  09/18/12  Yes [provider]  HYDROcodone-acetaminophen (NORCO) 10-325 MG tablet Take 1 tablet by mouth every 6 (six) hours as needed.   Yes [provider]  latanoprost (XALATAN) 0.005 % ophthalmic solution INSTILL ONE DROP IN BOTH EYES NIGHTLY 07/09/19  Yes Rankin, Alford Highland, MD  levETIRAcetam (KEPPRA) 500 MG tablet Take 500 mg by mouth 2 (two) times daily.   Yes [provider]  methocarbamol (ROBAXIN) 500 MG tablet Take 1 tablet by mouth in the morning and at bedtime.  02/22/19  Yes [provider]  nitroGLYCERIN (NITROSTAT) 0.4 MG SL tablet Place 1 tablet (0.4 mg total) under the tongue every 5 (five) minutes x 3 doses as needed  for chest pain. 01/12/18  Yes Georgie Chard D, NP  pantoprazole (PROTONIX) 40 MG tablet Take 40 mg by mouth daily.   Yes [provider]  potassium chloride (K-DUR) 10 MEQ tablet Take 10 mEq by mouth daily.    Yes [provider]  Propylene Glycol (SYSTANE BALANCE OP) Place 1-2 drops into both eyes daily as needed (dry eyes).   Yes [provider]  risperiDONE (RISPERDAL) 0.5 MG tablet Take 0.5 mg by mouth 2 (two) times daily. 04/23/20  Yes [provider]  amLODipine (NORVASC) 5 MG tablet Take 1 tablet (5 mg total) by mouth daily. 05/29/19 12/04/19  Jonelle Sidle, MD  lisinopril (ZESTRIL) 20 MG tablet  Take 1 tablet (20 mg total) by mouth 2 (two) times daily. 01/24/19 05/03/19  Ellsworth Lennox, PA-C    Physical Exam: Vitals:   05/01/20 1645 05/01/20 1751 05/01/20 1752 05/01/20 1806  BP: (!) 128/59   136/61  Pulse: 93   98  Resp: 19  16   Temp: 99.8 F (37.7 C)   100.2 F (37.9 C)  TempSrc: Oral   Axillary  SpO2: 97%   97%  Weight:  57.1 kg    Height:        Constitutional: NAD, calm, comfortable Eyes: PERRL, lids and conjunctivae normal ENMT: Mucous membranes are moist. Posterior pharynx clear of any exudate or lesions.Normal dentition.  Neck: normal, supple, no masses, no thyromegaly Respiratory: clear to auscultation bilaterally, no wheezing, no crackles. Normal respiratory effort. No accessory muscle use.  Cardiovascular: Regular rate and rhythm, no murmurs / rubs / gallops. No extremity edema. 2+ pedal pulses. No carotid bruits.  Abdomen: no tenderness, no masses palpated. No hepatosplenomegaly. Bowel sounds positive.  Musculoskeletal: no clubbing / cyanosis. No joint deformity upper and lower extremities. Good ROM, no contractures. Normal muscle tone.  Skin: no rashes, lesions, ulcers. No induration Neurologic: CN 2-12 grossly intact. Sensation intact, DTR normal. Strength 5/5 in all 4.  Psychiatric: Normal judgment and insight. Alert and  oriented x 3. Normal mood.    Labs on Admission: I have personally reviewed following labs and imaging studies  CBC: Recent Labs  Lab 05/01/20 1140  WBC 29.1*  NEUTROABS 25.1*  HGB 7.8*  HCT 26.0*  MCV 80.5  PLT 522*   Basic Metabolic Panel: Recent Labs  Lab 05/01/20 1140  NA 131*  K 4.8  CL 98  CO2 26  GLUCOSE 109*  BUN 31*  CREATININE 0.63  CALCIUM 8.5*   GFR: Estimated Creatinine Clearance: 38.5 mL/min (by C-G formula based on SCr of 0.63 mg/dL). Liver Function Tests: Recent Labs  Lab 05/01/20 1140  AST 14*  ALT 10  ALKPHOS 129*  BILITOT 0.6  PROT 6.5  ALBUMIN 2.1*   No results for input(s): LIPASE, AMYLASE in the last 168 hours. No results for input(s): AMMONIA in the last 168 hours. Coagulation Profile: Recent Labs  Lab 05/01/20 1140  INR 1.1   Cardiac Enzymes: No results for input(s): CKTOTAL, CKMB, CKMBINDEX, TROPONINI in the last 168 hours. BNP (last 3 results) No results for input(s): PROBNP in the last 8760 hours. HbA1C: No results for input(s): HGBA1C in the last 72 hours. CBG: No results for input(s): GLUCAP in the last 168 hours. Lipid Profile: No results for input(s): CHOL, HDL, LDLCALC, TRIG, CHOLHDL, LDLDIRECT in the last 72 hours. Thyroid Function Tests: No results for input(s): TSH, T4TOTAL, FREET4, T3FREE, THYROIDAB in the last 72 hours. Anemia Panel: No results for input(s): VITAMINB12, FOLATE, FERRITIN, TIBC, IRON, RETICCTPCT in the last 72 hours. Urine analysis:    Component Value Date/Time   COLORURINE YELLOW 05/01/2020 1215   APPEARANCEUR CLEAR 05/01/2020 1215   LABSPEC 1.019 05/01/2020 1215   PHURINE 5.0 05/01/2020 1215   GLUCOSEU NEGATIVE 05/01/2020 1215   HGBUR NEGATIVE 05/01/2020 1215   BILIRUBINUR NEGATIVE 05/01/2020 1215   KETONESUR NEGATIVE 05/01/2020 1215   PROTEINUR 30 (A) 05/01/2020 1215   UROBILINOGEN 0.2 10/18/2012 1944   NITRITE NEGATIVE 05/01/2020 1215   LEUKOCYTESUR MODERATE (A) 05/01/2020 1215     Radiological Exams on Admission: DG Chest Port 1 View  Result Date: 05/01/2020 CLINICAL DATA:  Sepsis. EXAM: PORTABLE CHEST 1 VIEW COMPARISON:  January 10, 2019. FINDINGS: Stable cardiomediastinal silhouette. Stable hiatal hernia. No pneumothorax is noted. Left lung is clear. New large rounded opacity is noted in right lower lobe which may represent pneumonia, but neoplasm cannot be excluded. Mild right basilar subsegmental atelectasis and right pleural effusion is noted as well. Bony thorax is unremarkable. IMPRESSION: New large ill-defined rounded opacity is noted in right lower lobe which may represent pneumonia, but neoplasm cannot be excluded. CT scan of the chest with intravenous contrast is recommended for further evaluation. Aortic Atherosclerosis (ICD10-I70.0). Electronically Signed   By: Lupita RaiderJames  Green Jr M.D.   On: 05/01/2020 12:56   DG HIPS BILAT WITH PELVIS MIN 5 VIEWS  Result Date: 05/01/2020 CLINICAL DATA:  Left hip pain after possible fall. EXAM: DG HIP (WITH OR WITHOUT PELVIS) 5+V BILAT COMPARISON:  None. FINDINGS: There is no evidence of hip fracture or dislocation. Moderate narrowing of the right hip joint is noted. IMPRESSION: Moderate osteoarthritis of the right hip. No acute abnormality is noted in either hip. Electronically Signed   By: Lupita RaiderJames  Green Jr M.D.   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 -Started on ceftriaxone and azithromycin -Start bronchodilators to promote pulmonary hygiene -Check CT chest to rule out underlying neoplasm  Diarrhea -Noted to have significant diarrhea over the past 2 weeks. -We will check CT abdomen and pelvis to  evaluate for underlying colitis  Anemia -Unclear baseline hemoglobin -She was noted to have heme positive stools -Per patient, she does not have any melena or hematochezia -Start on PPI -Check anemia panel -Gastroenterology consultation  Seizure disorder -Continue on Keppra  Hyperlipidemia -Continue on statin  Hypertension -Hold antihypertensives for now in light of infection -If blood pressure trends up, can certainly resume these medications  DVT prophylaxis: lovenox  Code Status: DNR confirmed with patient's daughter Family Communication: Discussed with patient's daughter Lupita LeashDonna who is her power of attorney Disposition Plan: pending hospital course, may need placement  Consults called: gastroenterology  Admission status: observation, tele   Erick BlinksJehanzeb Shandell Giovanni MD [facility]   If 7PM-7AM, please contact night-coverage [email or web address]   05/01/2020, 7:18 PM

## 2020-05-01 NOTE — Plan of Care (Signed)
  Problem: Education: Goal: Knowledge of General Education information will improve Description: Including pain rating scale, medication(s)/side effects and non-pharmacologic comfort measures 05/01/2020 1829 by Zachery Dakins, RN Outcome: Progressing 05/01/2020 1829 by Zachery Dakins, RN Outcome: Progressing   Problem: Health Behavior/Discharge Planning: Goal: Ability to manage health-related needs will improve 05/01/2020 1829 by Zachery Dakins, RN Outcome: Progressing 05/01/2020 1829 by Zachery Dakins, RN Outcome: Progressing   Problem: Clinical Measurements: Goal: Ability to maintain clinical measurements within normal limits will improve 05/01/2020 1829 by Zachery Dakins, RN Outcome: Progressing 05/01/2020 1829 by Zachery Dakins, RN Outcome: Progressing Goal: Will remain free from infection 05/01/2020 1829 by Zachery Dakins, RN Outcome: Progressing 05/01/2020 1829 by Zachery Dakins, RN Outcome: Progressing Goal: Diagnostic test results will improve 05/01/2020 1829 by Zachery Dakins, RN Outcome: Progressing 05/01/2020 1829 by Zachery Dakins, RN Outcome: Progressing Goal: Respiratory complications will improve 05/01/2020 1829 by Zachery Dakins, RN Outcome: Progressing 05/01/2020 1829 by Zachery Dakins, RN Outcome: Progressing Goal: Cardiovascular complication will be avoided 05/01/2020 1829 by Zachery Dakins, RN Outcome: Progressing 05/01/2020 1829 by Zachery Dakins, RN Outcome: Progressing   Problem: Activity: Goal: Risk for activity intolerance will decrease 05/01/2020 1829 by Zachery Dakins, RN Outcome: Progressing 05/01/2020 1829 by Zachery Dakins, RN Outcome: Progressing   Problem: Nutrition: Goal: Adequate nutrition will be maintained 05/01/2020 1829 by Zachery Dakins, RN Outcome: Progressing 05/01/2020 1829 by Zachery Dakins, RN Outcome: Progressing   Problem:  Coping: Goal: Level of anxiety will decrease 05/01/2020 1829 by Zachery Dakins, RN Outcome: Progressing 05/01/2020 1829 by Zachery Dakins, RN Outcome: Progressing   Problem: Elimination: Goal: Will not experience complications related to bowel motility 05/01/2020 1829 by Zachery Dakins, RN Outcome: Progressing 05/01/2020 1829 by Zachery Dakins, RN Outcome: Progressing Goal: Will not experience complications related to urinary retention 05/01/2020 1829 by Zachery Dakins, RN Outcome: Progressing 05/01/2020 1829 by Zachery Dakins, RN Outcome: Progressing   Problem: Pain Managment: Goal: General experience of comfort will improve 05/01/2020 1829 by Zachery Dakins, RN Outcome: Progressing 05/01/2020 1829 by Zachery Dakins, RN Outcome: Progressing   Problem: Safety: Goal: Ability to remain free from injury will improve 05/01/2020 1829 by Zachery Dakins, RN Outcome: Progressing 05/01/2020 1829 by Zachery Dakins, RN Outcome: Progressing   Problem: Skin Integrity: Goal: Risk for impaired skin integrity will decrease 05/01/2020 1829 by Zachery Dakins, RN Outcome: Progressing 05/01/2020 1829 by Zachery Dakins, RN Outcome: Progressing   Problem: Activity: Goal: Ability to tolerate increased activity will improve 05/01/2020 1829 by Zachery Dakins, RN Outcome: Progressing 05/01/2020 1829 by Zachery Dakins, RN Outcome: Progressing   Problem: Clinical Measurements: Goal: Ability to maintain a body temperature in the normal range will improve 05/01/2020 1829 by Zachery Dakins, RN Outcome: Progressing 05/01/2020 1829 by Zachery Dakins, RN Outcome: Progressing   Problem: Respiratory: Goal: Ability to maintain adequate ventilation will improve 05/01/2020 1829 by Zachery Dakins, RN Outcome: Progressing 05/01/2020 1829 by Zachery Dakins, RN Outcome: Progressing Goal: Ability to maintain a  clear airway will improve 05/01/2020 1829 by Zachery Dakins, RN Outcome: Progressing 05/01/2020 1829 by Zachery Dakins, RN Outcome: Progressing   Problem: Urinary Elimination: Goal: Signs and symptoms of infection will decrease 05/01/2020 1829 by Zachery Dakins, RN Outcome: Progressing 05/01/2020 1829 by Zachery Dakins, RN Outcome: Progressing

## 2020-05-01 NOTE — Plan of Care (Signed)
  Problem: Education: Goal: Knowledge of General Education information will improve Description: Including pain rating scale, medication(s)/side effects and non-pharmacologic comfort measures Outcome: Progressing   Problem: Health Behavior/Discharge Planning: Goal: Ability to manage health-related needs will improve Outcome: Progressing   Problem: Clinical Measurements: Goal: Ability to maintain clinical measurements within normal limits will improve Outcome: Progressing Goal: Will remain free from infection Outcome: Progressing Goal: Diagnostic test results will improve Outcome: Progressing Goal: Respiratory complications will improve Outcome: Progressing Goal: Cardiovascular complication will be avoided Outcome: Progressing   Problem: Activity: Goal: Risk for activity intolerance will decrease Outcome: Progressing   Problem: Nutrition: Goal: Adequate nutrition will be maintained Outcome: Progressing   Problem: Coping: Goal: Level of anxiety will decrease Outcome: Progressing   Problem: Elimination: Goal: Will not experience complications related to bowel motility Outcome: Progressing Goal: Will not experience complications related to urinary retention Outcome: Progressing   Problem: Pain Managment: Goal: General experience of comfort will improve Outcome: Progressing   Problem: Safety: Goal: Ability to remain free from injury will improve Outcome: Progressing   Problem: Skin Integrity: Goal: Risk for impaired skin integrity will decrease Outcome: Progressing   Problem: Activity: Goal: Ability to tolerate increased activity will improve Outcome: Progressing   Problem: Clinical Measurements: Goal: Ability to maintain a body temperature in the normal range will improve Outcome: Progressing   Problem: Respiratory: Goal: Ability to maintain adequate ventilation will improve Outcome: Progressing Goal: Ability to maintain a clear airway will improve Outcome:  Progressing   Problem: Urinary Elimination: Goal: Signs and symptoms of infection will decrease Outcome: Progressing   

## 2020-05-01 NOTE — ED Triage Notes (Signed)
Pt arrived by [facility]. Pt was sent bye family to be evaluated for weakness and a possible fall. Pt went to her PCP yesterday for weakness where they gave her IVF. When pt returned home the family noticed per EMS that she could not walk. Pt is c/o of left hip pain. No fall witnessed. Pt has hx of dementia. MD at bedside for MSE>

## 2020-05-01 NOTE — ED Provider Notes (Signed)
[facility] EMERGENCY DEPARTMENT Provider Note   CSN: [id number] Arrival date & time: 05/01/20  1119     History Chief Complaint  Patient presents with  . Weakness    Beverly [name] is a 87 y.o. female.  Level 5 caveat secondary to dementia.  Patient brought in by EMS from home.  Per EMS patient was seen by her PCP yesterday for weakness and given IV fluids.  Patient noted by family to be unable to walk.  Possible fall not witnessed.  Patient denies complaints although is very poor historian.  The history is provided by the patient and the EMS personnel.  Weakness Severity:  Unable to specify Onset quality:  Gradual Timing:  Constant Progression:  Worsening Relieved by:  Nothing Worsened by:  Activity Ineffective treatments:  Drinking fluids      Past Medical History:  Diagnosis Date  . Anxiety   . Arthritis   . COPD (chronic obstructive pulmonary disease) (HCC)   . Coronary artery disease    a. STEMI with cardiac arrest (polymorphic VT) s/p DES to RCA.  Marland Kitchen GERD (gastroesophageal reflux disease)   . Headache(784.0)   . Hearing loss, central    Left ear  . Hematoma   . Hypertension   . Memory changes   . Mild aortic stenosis   . Mild carotid artery disease (HCC)    a. 1-39% by duplex 02/2018.  Marland Kitchen Seizures (HCC) 10/2012   No seizures, speech issues; after a fall, hitting head on left side    Patient Active Problem List   Diagnosis Date Noted  . 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 Exudative age-related macular degeneration of left eye with active choroidal neovascularization (HCC) 05/13/2019  . 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 neovascularization (HCC) 05/13/2019  . Primary open angle glaucoma of both eyes, mild stage 05/13/2019  . Age-related nuclear cataract of left eye 05/13/2019  . Hypokalemia 01/12/2018  . Hypomagnesemia 01/12/2018  . COPD  (chronic obstructive pulmonary disease) (HCC) 01/12/2018  . Acute ST elevation myocardial infarction (STEMI) of inferior wall (HCC) 01/10/2018  . Cardiac arrest (HCC)   . Dyslipidemia (high LDL; low HDL)   . 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 History of traumatic subdural hematoma 06/23/2014  . Subdural hematoma (HCC) 10/18/2012  . TIA (transient ischemic attack) 10/18/2012  . HTN (hypertension) 10/18/2012  . Hyponatremia 10/18/2012    Past Surgical History:  Procedure Laterality Date  . ABDOMINAL HYSTERECTOMY    . APPENDECTOMY    . CARDIAC CATHETERIZATION     15 yrs ago  . CORONARY/GRAFT ACUTE MI REVASCULARIZATION N/A 01/10/2018   Procedure: Coronary/Graft Acute MI Revascularization;  Surgeon: Runell Gess, MD;  Location: [facility] INVASIVE CV LAB;  Service: Cardiovascular;  Laterality: N/A;  . CRANIOTOMY Left 10/30/2012   Procedure: CRANIOTOMY HEMATOMA EVACUATION SUBDURAL;  Surgeon: Maeola Harman, MD;  Location: [facility] NEURO ORS;  Service: Neurosurgery;  Laterality: Left;  Left Craniotomy for evacuation of subdural hematoma  . JOINT REPLACEMENT Bilateral    knees  . LEFT HEART CATH AND CORONARY ANGIOGRAPHY N/A 01/10/2018   Procedure: LEFT HEART CATH AND CORONARY ANGIOGRAPHY;  Surgeon: Runell Gess, MD;  Location: [facility] INVASIVE CV LAB;  Service: Cardiovascular;  Laterality: N/A;     OB History    Gravida      Para      Term  Preterm      AB      Living  2     SAB      IAB      Ectopic      Multiple      Live Births              Family History  Problem Relation Age of Onset  . Cancer Father   . Ataxia Neg Hx   . Chorea Neg Hx   . Dementia Neg Hx   . Mental retardation Neg Hx   . Migraines Neg Hx   . Multiple sclerosis Neg Hx   . Neurofibromatosis Neg Hx   . Neuropathy Neg Hx   . Parkinsonism Neg Hx   . Seizures Neg Hx   . Stroke  Neg Hx     Social History   Tobacco Use  . Smoking status: Never Smoker  . Smokeless tobacco: Never Used  Vaping Use  . Vaping Use: Never used  Substance Use Topics  . Alcohol use: No  . Drug use: No    Home Medications Prior to Admission medications   Medication Sig Start Date End Date Taking? Authorizing Provider  acetaminophen (TYLENOL) 500 MG tablet Take 1 tablet (500 mg total) by mouth as needed (no more than 3 gm / day). 12/04/19   Ronney Asters, NP  ALPRAZolam Prudy Feeler) 0.5 MG tablet Take 0.5 mg by mouth 3 (three) times daily as needed for anxiety.    [provider]  amLODipine (NORVASC) 5 MG tablet Take 1 tablet (5 mg total) by mouth daily. 05/29/19 12/04/19  Jonelle Sidle, MD  aspirin 81 MG chewable tablet Chew 1 tablet (81 mg total) by mouth daily. 01/13/18   Filbert Schilder, NP  atorvastatin (LIPITOR) 80 MG tablet Take 1 tablet (80 mg total) by mouth daily at 6 PM. 01/12/18   Filbert Schilder, NP  carvedilol (COREG) 25 MG tablet Take 25 mg by mouth 2 (two) times daily with a meal.    [provider]  carvedilol (COREG) 6.25 MG tablet TAKE ONE TABLET (6.25MG  TOTAL) BY MOUTH TWO TIMES DAILY 04/10/20   Jonelle Sidle, MD  celecoxib (CELEBREX) 200 MG capsule Take 200 mg by mouth daily.    [provider]  cholecalciferol (VITAMIN D) 1000 UNITS tablet Take 1,000 Units by mouth daily.     [provider]  fluticasone (FLONASE) 50 MCG/ACT nasal spray Place 1 spray into both nostrils daily.  09/18/12   [provider]  gabapentin (NEURONTIN) 300 MG capsule Take 300 mg by mouth daily. 04/12/19   [provider]  HYDROcodone-acetaminophen (NORCO) 10-325 MG tablet Take 1 tablet by mouth every 6 (six) hours as needed.    [provider]  latanoprost (XALATAN) 0.005 % ophthalmic solution INSTILL ONE DROP IN BOTH EYES NIGHTLY 07/09/19   Rankin, Alford Highland, MD  levETIRAcetam (KEPPRA) 500 MG tablet Take 500 mg by mouth 2 (two) times  daily.    [provider]  methocarbamol (ROBAXIN) 500 MG tablet Take 1 tablet by mouth in the morning and at bedtime.  02/22/19   [provider]  Multiple Vitamins-Minerals (PRESERVISION/LUTEIN PO) Take 1 tablet by mouth 2 (two) times daily.    [provider]  nitroGLYCERIN (NITROSTAT) 0.4 MG SL tablet Place 1 tablet (0.4 mg total) under the tongue every 5 (five) minutes x 3 doses as needed for chest pain. 01/12/18   Filbert Schilder, NP  pantoprazole (PROTONIX)  40 MG tablet Take 40 mg by mouth daily.    [provider]  potassium chloride (K-DUR) 10 MEQ tablet Take 10 mEq by mouth daily.     [provider]  Propylene Glycol (SYSTANE BALANCE OP) Place 1-2 drops into both eyes daily as needed (dry eyes).    [provider]  sertraline (ZOLOFT) 100 MG tablet Take 100 mg by mouth daily. 03/29/19   [provider]  lisinopril (ZESTRIL) 20 MG tablet Take 1 tablet (20 mg total) by mouth 2 (two) times daily. 01/24/19 05/03/19  Ellsworth Lennox, PA-C    Allergies    Lisinopril, Eggs or egg-derived products, Morphine and related, and Penicillins  Review of Systems   Review of Systems  Unable to perform ROS: Dementia  Neurological: Positive for weakness.    Physical Exam Updated Vital Signs BP (!) 128/59 (BP Location: Left Arm)   Pulse 93   Temp 99.8 F (37.7 C) (Oral)   Resp 19   Ht 4\' 10"  (1.473 m)   Wt 72.6 kg   SpO2 97%   BMI 33.44 kg/m   Physical Exam Vitals and nursing note reviewed.  Constitutional:      General: She is not in acute distress.    Appearance: Normal appearance. She is well-developed.  HENT:     Head: Normocephalic and atraumatic.  Eyes:     Conjunctiva/sclera: Conjunctivae normal.  Cardiovascular:     Rate and Rhythm: Normal rate and regular rhythm.     Heart sounds: Murmur heard.    Pulmonary:     Effort: Pulmonary effort is normal. No respiratory distress.     Breath sounds: Normal breath  sounds.  Abdominal:     Palpations: Abdomen is soft.     Tenderness: There is no abdominal tenderness.  Musculoskeletal:        General: Tenderness present. Normal range of motion.     Cervical back: Neck supple.     Comments: Upper extremity full range of motion without any pain or limitations.  Lower extremities no gross deformities.  She has some pain bilaterally with flexion at the hips.  No shortening or rotation.  Knees and ankles nontender.  Skin:    General: Skin is warm and dry.     Coloration: Skin is pale.  Neurological:     General: No focal deficit present.     Mental Status: She is alert. She is disoriented.     Comments: Moving all extremities nonfocally     ED Results / Procedures / Treatments   Labs (all labs ordered are listed, but only abnormal results are displayed) Labs Reviewed  COMPREHENSIVE METABOLIC PANEL - Abnormal; Notable for the following components:      Result Value   Sodium 131 (*)    Glucose, Bld 109 (*)    BUN 31 (*)    Calcium 8.5 (*)    Albumin 2.1 (*)    AST 14 (*)    Alkaline Phosphatase 129 (*)    All other components within normal limits  CBC WITH DIFFERENTIAL/PLATELET - Abnormal; Notable for the following components:   WBC 29.1 (*)    RBC 3.23 (*)    Hemoglobin 7.8 (*)    HCT 26.0 (*)    MCH 24.1 (*)    RDW 18.9 (*)    Platelets 522 (*)    Neutro Abs 25.1 (*)    Monocytes Absolute 2.0 (*)    Abs Immature Granulocytes 0.56 (*)  All other components within normal limits  APTT - Abnormal; Notable for the following components:   aPTT 42 (*)    All other components within normal limits  URINALYSIS, ROUTINE W REFLEX MICROSCOPIC - Abnormal; Notable for the following components:   Protein, ur 30 (*)    Leukocytes,Ua MODERATE (*)    Bacteria, UA RARE (*)    All other components within normal limits  POC OCCULT BLOOD, ED - Abnormal; Notable for the following components:   Fecal Occult Bld POSITIVE (*)    All other components  within normal limits  CULTURE, BLOOD (ROUTINE X 2)  CULTURE, BLOOD (ROUTINE X 2)  RESP PANEL BY RT-PCR (FLU A&B, COVID) ARPGX2  URINE CULTURE  LACTIC ACID, PLASMA  PROTIME-INR    EKG None  Radiology DG Chest Port 1 View  Result Date: 05/01/2020 CLINICAL DATA:  Sepsis. EXAM: PORTABLE CHEST 1 VIEW COMPARISON:  January 10, 2019. FINDINGS: Stable cardiomediastinal silhouette. Stable hiatal hernia. No pneumothorax is noted. Left lung is clear. New large rounded opacity is noted in right lower lobe which may represent pneumonia, but neoplasm cannot be excluded. Mild right basilar subsegmental atelectasis and right pleural effusion is noted as well. Bony thorax is unremarkable. IMPRESSION: New large ill-defined rounded opacity is noted in right lower lobe which may represent pneumonia, but neoplasm cannot be excluded. CT scan of the chest with intravenous contrast is recommended for further evaluation. Aortic Atherosclerosis (ICD10-I70.0). Electronically Signed   By: Lupita Raider M.D.   On: 05/01/2020 12:56   DG HIPS BILAT WITH PELVIS MIN 5 VIEWS  Result Date: 05/01/2020 CLINICAL DATA:  Left hip pain after possible fall. EXAM: DG HIP (WITH OR WITHOUT PELVIS) 5+V BILAT COMPARISON:  None. FINDINGS: There is no evidence of hip fracture or dislocation. Moderate narrowing of the right hip joint is noted. IMPRESSION: Moderate osteoarthritis of the right hip. No acute abnormality is noted in either hip. Electronically Signed   By: Lupita Raider M.D.   On: 05/01/2020 12:59    Procedures Procedures   Medications Ordered in ED Medications  lactated ringers bolus 1,000 mL (has no administration in time range)    ED Course  I have reviewed the triage vital signs and the nursing notes.  Pertinent labs & imaging results that were available during my care of the patient were reviewed by me and considered in my medical decision making (see chart for details).  Clinical Course as of 05/01/20 1703   Fri May 01, 2020  1221 Rectal exam done with tech as chaperone.  Normal tone no masses.  Sample sent to lab for guaiac. [MB]    Clinical Course User Index [MB] Terrilee Files, MD   MDM Rules/Calculators/A&P                         CALLAHAN PEDDIE was evaluated in Emergency Department on 05/01/2020 for the symptoms described in the history of present illness. She was evaluated in the context of the global COVID-19 pandemic, which necessitated consideration that the patient might be at risk for infection with the SARS-CoV-2 virus that causes COVID-19. Institutional protocols and algorithms that pertain to the evaluation of patients at risk for COVID-19 are in a state of rapid change based on information released by regulatory bodies including the CDC and federal and state organizations. These policies and algorithms were followed during the patient's care in the ED.  This patient complains of generalized weakness possible  fall; this involves an extensive number of treatment Options and is a complaint that carries with it a high risk of complications and Morbidity. The differential includes dehydration, renal failure, anemia, GI bleed, infection, Covid metabolic derangement  I ordered, reviewed and interpreted labs, which included CBC with elevated white count, hemoglobin lower than baseline's, platelets elevated likely reactive, chemistries mildly off with low sodium elevated glucose and BUN, low albumin reflecting some malnutrition, lactate not elevated, urinalysis equivocal for infection, blood cultures I ordered medication IV fluids IV antibiotics I ordered imaging studies which included chest x-ray and I independently    visualized and interpreted imaging which showed likely right lower lobe pneumonia Additional history obtained from patient's daughter Previous records obtained and reviewed in epic, no recent admissions I consulted [facility] hospitalist Dr. Avie ArenasMemnon and discussed lab and imaging  findings  Critical Interventions: None  After the interventions stated above, I reevaluated the patient and found patient's vital signs to be more improved.  Due to her SIRS and pneumonia and possible UTI will need admission for further work-up.  Also may need GI bleed work-up.   Final Clinical Impression(s) / ED Diagnoses Final diagnoses:  SIRS (systemic inflammatory response syndrome) (HCC)  Community acquired pneumonia of right lung, unspecified part of lung  Lower urinary tract infectious disease  Heme positive stool  Anemia, unspecified type    Rx / DC Orders ED Discharge Orders    None       Terrilee FilesButler, Yojan Paskett C, MD 05/01/20 1708

## 2020-05-02 ENCOUNTER — Other Ambulatory Visit: Payer: Self-pay

## 2020-05-02 ENCOUNTER — Observation Stay (HOSPITAL_COMMUNITY): Payer: Medicare Other

## 2020-05-02 DIAGNOSIS — A419 Sepsis, unspecified organism: Secondary | ICD-10-CM | POA: Diagnosis not present

## 2020-05-02 DIAGNOSIS — F0390 Unspecified dementia without behavioral disturbance: Secondary | ICD-10-CM | POA: Diagnosis present

## 2020-05-02 DIAGNOSIS — K828 Other specified diseases of gallbladder: Secondary | ICD-10-CM | POA: Diagnosis not present

## 2020-05-02 DIAGNOSIS — R6520 Severe sepsis without septic shock: Secondary | ICD-10-CM | POA: Diagnosis not present

## 2020-05-02 DIAGNOSIS — K449 Diaphragmatic hernia without obstruction or gangrene: Secondary | ICD-10-CM | POA: Diagnosis not present

## 2020-05-02 DIAGNOSIS — H918X2 Other specified hearing loss, left ear: Secondary | ICD-10-CM | POA: Diagnosis present

## 2020-05-02 DIAGNOSIS — I10 Essential (primary) hypertension: Secondary | ICD-10-CM | POA: Diagnosis present

## 2020-05-02 DIAGNOSIS — G40109 Localization-related (focal) (partial) symptomatic epilepsy and epileptic syndromes with simple partial seizures, not intractable, without status epilepticus: Secondary | ICD-10-CM | POA: Diagnosis present

## 2020-05-02 DIAGNOSIS — R197 Diarrhea, unspecified: Secondary | ICD-10-CM | POA: Diagnosis present

## 2020-05-02 DIAGNOSIS — H353133 Nonexudative age-related macular degeneration, bilateral, advanced atrophic without subfoveal involvement: Secondary | ICD-10-CM | POA: Diagnosis present

## 2020-05-02 DIAGNOSIS — B9620 Unspecified Escherichia coli [E. coli] as the cause of diseases classified elsewhere: Secondary | ICD-10-CM | POA: Diagnosis present

## 2020-05-02 DIAGNOSIS — D500 Iron deficiency anemia secondary to blood loss (chronic): Secondary | ICD-10-CM | POA: Diagnosis not present

## 2020-05-02 DIAGNOSIS — D649 Anemia, unspecified: Secondary | ICD-10-CM | POA: Diagnosis not present

## 2020-05-02 DIAGNOSIS — J189 Pneumonia, unspecified organism: Secondary | ICD-10-CM | POA: Diagnosis present

## 2020-05-02 DIAGNOSIS — H401131 Primary open-angle glaucoma, bilateral, mild stage: Secondary | ICD-10-CM | POA: Diagnosis present

## 2020-05-02 DIAGNOSIS — R64 Cachexia: Secondary | ICD-10-CM | POA: Diagnosis present

## 2020-05-02 DIAGNOSIS — R6510 Systemic inflammatory response syndrome (SIRS) of non-infectious origin without acute organ dysfunction: Secondary | ICD-10-CM | POA: Diagnosis not present

## 2020-05-02 DIAGNOSIS — N390 Urinary tract infection, site not specified: Secondary | ICD-10-CM | POA: Diagnosis present

## 2020-05-02 DIAGNOSIS — Z66 Do not resuscitate: Secondary | ICD-10-CM | POA: Diagnosis present

## 2020-05-02 DIAGNOSIS — J9601 Acute respiratory failure with hypoxia: Secondary | ICD-10-CM | POA: Diagnosis present

## 2020-05-02 DIAGNOSIS — I252 Old myocardial infarction: Secondary | ICD-10-CM | POA: Diagnosis not present

## 2020-05-02 DIAGNOSIS — E785 Hyperlipidemia, unspecified: Secondary | ICD-10-CM | POA: Diagnosis not present

## 2020-05-02 DIAGNOSIS — Z20822 Contact with and (suspected) exposure to covid-19: Secondary | ICD-10-CM | POA: Diagnosis present

## 2020-05-02 DIAGNOSIS — R195 Other fecal abnormalities: Secondary | ICD-10-CM | POA: Diagnosis not present

## 2020-05-02 DIAGNOSIS — J984 Other disorders of lung: Secondary | ICD-10-CM | POA: Diagnosis not present

## 2020-05-02 DIAGNOSIS — M1990 Unspecified osteoarthritis, unspecified site: Secondary | ICD-10-CM | POA: Diagnosis present

## 2020-05-02 DIAGNOSIS — Z515 Encounter for palliative care: Secondary | ICD-10-CM | POA: Diagnosis not present

## 2020-05-02 DIAGNOSIS — J440 Chronic obstructive pulmonary disease with acute lower respiratory infection: Secondary | ICD-10-CM | POA: Diagnosis present

## 2020-05-02 DIAGNOSIS — K921 Melena: Secondary | ICD-10-CM | POA: Diagnosis not present

## 2020-05-02 DIAGNOSIS — Z7189 Other specified counseling: Secondary | ICD-10-CM | POA: Diagnosis not present

## 2020-05-02 DIAGNOSIS — D509 Iron deficiency anemia, unspecified: Secondary | ICD-10-CM | POA: Diagnosis present

## 2020-05-02 DIAGNOSIS — J90 Pleural effusion, not elsewhere classified: Secondary | ICD-10-CM | POA: Diagnosis not present

## 2020-05-02 DIAGNOSIS — I2510 Atherosclerotic heart disease of native coronary artery without angina pectoris: Secondary | ICD-10-CM | POA: Diagnosis present

## 2020-05-02 DIAGNOSIS — K219 Gastro-esophageal reflux disease without esophagitis: Secondary | ICD-10-CM | POA: Diagnosis present

## 2020-05-02 DIAGNOSIS — J181 Lobar pneumonia, unspecified organism: Secondary | ICD-10-CM | POA: Diagnosis not present

## 2020-05-02 DIAGNOSIS — F419 Anxiety disorder, unspecified: Secondary | ICD-10-CM | POA: Diagnosis present

## 2020-05-02 DIAGNOSIS — N179 Acute kidney failure, unspecified: Secondary | ICD-10-CM | POA: Diagnosis not present

## 2020-05-02 DIAGNOSIS — E871 Hypo-osmolality and hyponatremia: Secondary | ICD-10-CM | POA: Diagnosis present

## 2020-05-02 LAB — COMPREHENSIVE METABOLIC PANEL
ALT: 10 U/L (ref 0–44)
AST: 17 U/L (ref 15–41)
Albumin: 1.9 g/dL — ABNORMAL LOW (ref 3.5–5.0)
Alkaline Phosphatase: 121 U/L (ref 38–126)
Anion gap: 7 (ref 5–15)
BUN: 21 mg/dL (ref 8–23)
CO2: 26 mmol/L (ref 22–32)
Calcium: 8.5 mg/dL — ABNORMAL LOW (ref 8.9–10.3)
Chloride: 100 mmol/L (ref 98–111)
Creatinine, Ser: 0.49 mg/dL (ref 0.44–1.00)
GFR, Estimated: >60 mL/min (ref >60)
Glucose, Bld: 96 mg/dL (ref 70–99)
Potassium: 4.2 mmol/L (ref 3.5–5.1)
Sodium: 133 mmol/L — ABNORMAL LOW (ref 135–145)
Total Bilirubin: 0.7 mg/dL (ref 0.3–1.2)
Total Protein: 6.2 g/dL — ABNORMAL LOW (ref 6.5–8.1)

## 2020-05-02 LAB — C DIFFICILE QUICK SCREEN W PCR REFLEX
C Diff antigen: NEGATIVE
C Diff interpretation: NOT DETECTED
C Diff toxin: NEGATIVE

## 2020-05-02 LAB — STREP PNEUMONIAE URINARY ANTIGEN: Strep Pneumo Urinary Antigen: NEGATIVE

## 2020-05-02 LAB — CBC
HCT: 24.6 % — ABNORMAL LOW (ref 36.0–46.0)
Hemoglobin: 7.4 g/dL — ABNORMAL LOW (ref 12.0–15.0)
MCH: 23.9 pg — ABNORMAL LOW (ref 26.0–34.0)
MCHC: 30.1 g/dL (ref 30.0–36.0)
MCV: 79.4 fL — ABNORMAL LOW (ref 80.0–100.0)
Platelets: 555 10*3/uL — ABNORMAL HIGH (ref 150–400)
RBC: 3.1 MIL/uL — ABNORMAL LOW (ref 3.87–5.11)
RDW: 19.2 % — ABNORMAL HIGH (ref 11.5–15.5)
WBC: 21.3 10*3/uL — ABNORMAL HIGH (ref 4.0–10.5)
nRBC: 0 % (ref 0.0–0.2)

## 2020-05-02 LAB — PROTIME-INR
INR: 1.2 (ref 0.8–1.2)
Prothrombin Time: 14.8 seconds (ref 11.4–15.2)

## 2020-05-02 MED ORDER — METRONIDAZOLE 500 MG/100ML IV SOLN
500.0000 mg | Freq: Three times a day (TID) | INTRAVENOUS | Status: DC
  Administered 2020-05-02 – 2020-05-09 (×20): 500 mg via INTRAVENOUS
  Filled 2020-05-02 (×20): qty 100

## 2020-05-02 MED ORDER — GUAIFENESIN 100 MG/5ML PO SOLN
5.0000 mL | ORAL | Status: DC | PRN
  Administered 2020-05-02 – 2020-05-05 (×2): 100 mg via ORAL
  Filled 2020-05-02 (×2): qty 5

## 2020-05-02 MED ORDER — IOHEXOL 300 MG/ML  SOLN
100.0000 mL | Freq: Once | INTRAMUSCULAR | Status: AC | PRN
  Administered 2020-05-02: 100 mL via INTRAVENOUS

## 2020-05-02 MED ORDER — SODIUM CHLORIDE 0.9 % IV SOLN
2.0000 g | Freq: Two times a day (BID) | INTRAVENOUS | Status: DC
  Administered 2020-05-02 – 2020-05-09 (×14): 2 g via INTRAVENOUS
  Filled 2020-05-02 (×14): qty 2

## 2020-05-02 MED ORDER — ALPRAZOLAM 0.5 MG PO TABS
0.5000 mg | ORAL_TABLET | Freq: Three times a day (TID) | ORAL | Status: DC | PRN
  Administered 2020-05-02 – 2020-05-09 (×14): 0.5 mg via ORAL
  Filled 2020-05-02 (×15): qty 1

## 2020-05-02 NOTE — Progress Notes (Signed)
PROGRESS NOTE    Beverly [name]  QMV:[id number] DOB: Sep 04, 1932 DOA: 05/01/2020 PCP: Benita Stabile, MD    Brief Narrative:  87 year old female with a history of seizure disorder, hypertension, hyperlipidemia, mild dementia, admitted to the hospital with generalized weakness, fever and cough.  Found to have significant right lower lobe pneumonia.  CT indicates possible underlying lung abscess versus mass.  She is on IV antibiotics.  Also noted to have heme positive stools and anemia.  GI following.   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 -Currently on IV antibiotics -CT chest shows underlying lung abscess versus mass -We will likely need pulmonology input on Monday -Change ceftriaxone to cefepime for pseudomonal coverage -Add Flagyl for anaerobic coverage -Follow-up sputum culture -Urinary antigens are pending  Diarrhea -Noted to have significant diarrhea over the past 2 weeks. -CT abdomen does not indicate any clear-cut colitis -Stool for C. difficile is negative  Anemia -Unclear baseline hemoglobin -She was noted to have heme positive stools -Per patient, she does not have any melena or hematochezia -Start on PPI -Anemia panel indicates iron deficiency -Seen by GI, appreciate input -She will need EGD/colonoscopy once medically stable -Can consider iron infusion once infectious process has stabilized -Transfuse for hemoglobin less than 7  Seizure disorder -Continue on Keppra  Hyperlipidemia -Continue on statin  Hypertension -Hold antihypertensives for now in light of infection -If blood pressure trends up, can certainly resume these  medications  Anxiety -Continue on home dose of Xanax   DVT prophylaxis: SCDs Start: 05/01/20 1907  Code Status: DNR Family Communication: Discussed with daughter at the bedside Disposition Plan: Status is: Observation  The patient will require care spanning > 2 midnights and should be moved to inpatient because: IV treatments appropriate due to intensity of illness or inability to take PO  Dispo: The patient is from: Home              Anticipated d/c is to: TBD, may need placement              Patient currently is not medically stable to d/c.   Difficult to place patient No         Consultants:   GI  Procedures:     Antimicrobials:   Ceftriaxone 4/29 > 4/30  Cefepime 4/30 >  Azithromycin 4/29 >  Flagyl 4/30 >   Subjective: She is more awake and alert today.  She does have a productive cough.  Objective: Vitals:   05/01/20 1752 05/01/20 1806 05/01/20 2108 05/02/20 0129  BP:  136/61 101/64 (!) 98/48  Pulse:  98 91 85  Resp: 16  18 18   Temp:  100.2 F (37.9 C) 98.7 F (37.1 C) 98.5 F (36.9 C)  TempSrc:  Axillary Oral Oral  SpO2:  97% 96% 95%  Weight:      Height:        Intake/Output Summary (Last 24 hours) at 05/02/2020 1914 Last data filed at 05/02/2020 1515 Gross per 24 hour  Intake 2587.36 ml  Output 950 ml  Net 1637.36 ml   Filed Weights   05/01/20 1127 05/01/20 1751  Weight: 72.6 kg 57.1 kg    Examination:  General exam: Appears calm and comfortable  Respiratory system: coarse breath sounds on right side. Respiratory effort normal. Cardiovascular system: S1 & S2 heard, RRR. No JVD, murmurs, rubs, gallops or clicks. No pedal edema. Gastrointestinal system: Abdomen is nondistended, soft and nontender. No organomegaly or masses felt. Normal bowel sounds heard. Central nervous system: Alert and oriented. No focal neurological deficits. Extremities: Symmetric 5 x 5 power. Skin: No rashes, lesions or ulcers Psychiatry: Judgement and  insight appear normal. Mood & affect appropriate.     Data Reviewed: I have personally reviewed following labs and imaging studies  CBC: Recent Labs  Lab 05/01/20 1140 05/01/20 1943 05/02/20 0603  WBC 29.1* 23.0* 21.3*  NEUTROABS 25.1* 19.5*  --   HGB 7.8* 7.0* 7.4*  HCT 26.0* 22.8* 24.6*  MCV 80.5 79.4* 79.4*  PLT 522* 505* 555*   Basic Metabolic Panel: Recent Labs  Lab 05/01/20 1140 05/02/20 0603  NA 131* 133*  K 4.8 4.2  CL 98 100  CO2 26 26  GLUCOSE 109* 96  BUN 31* 21  CREATININE 0.63 0.49  CALCIUM 8.5* 8.5*   GFR: Estimated Creatinine Clearance: 38.5 mL/min (by C-G formula based on SCr of 0.49 mg/dL). Liver Function Tests: Recent Labs  Lab 05/01/20 1140 05/02/20 0603  AST 14* 17  ALT 10 10  ALKPHOS 129* 121  BILITOT 0.6 0.7  PROT 6.5 6.2*  ALBUMIN 2.1* 1.9*   No results for input(s): LIPASE, AMYLASE in the last 168 hours. No results for input(s): AMMONIA in the last 168 hours. Coagulation Profile: Recent Labs  Lab 05/01/20 1140 05/02/20 0603  INR 1.1 1.2   Cardiac Enzymes: No results for input(s): CKTOTAL, CKMB, CKMBINDEX, TROPONINI in the last 168 hours. BNP (last 3 results) No results for input(s): PROBNP in the last 8760 hours. HbA1C: No results for input(s): HGBA1C in the last 72 hours. CBG: No results for input(s): GLUCAP in the last 168 hours. Lipid Profile: No results for input(s): CHOL, HDL, LDLCALC, TRIG, CHOLHDL, LDLDIRECT in the last 72 hours. Thyroid Function Tests: No results for input(s): TSH, T4TOTAL, FREET4, T3FREE, THYROIDAB in the last 72 hours. Anemia Panel: Recent Labs    05/01/20 1943  VITAMINB12 1,840*  FOLATE 13.7  FERRITIN 70  TIBC UNABLE TO CALCULATE DUE TO LINEARITY  IRON <5*  RETICCTPCT 1.1   Sepsis Labs: Recent Labs  Lab 05/01/20 1140  LATICACIDVEN 1.1    Recent Results (from the past 240 hour(s))  Blood Culture (routine x 2)     Status: None (Preliminary result)   Collection Time: 05/01/20 11:41  AM   Specimen: BLOOD  Result Value Ref Range Status   Specimen Description BLOOD RIGHT ARM  Final   Special Requests   Final    BOTTLES DRAWN AEROBIC AND ANAEROBIC Blood Culture adequate volume   Culture   Final    NO GROWTH 1 DAY Performed at [facility], [address], [place], [place] [address]    Report Status PENDING  Incomplete  Blood Culture (routine x 2)     Status: None (Preliminary result)   Collection Time: 05/01/20 11:42 AM   Specimen: BLOOD  Result Value Ref Range Status   Specimen Description BLOOD LEFT ARM  Final   Special Requests   Final    BOTTLES DRAWN AEROBIC AND ANAEROBIC Blood Culture adequate volume   Culture   Final    NO GROWTH 1 DAY Performed at [facility], [address], [place], [place] [address]    Report Status PENDING  Incomplete  Resp Panel by  RT-PCR (Flu A&B, Covid) Nasopharyngeal Swab     Status: None   Collection Time: 05/01/20 12:15 PM   Specimen: Nasopharyngeal Swab; Nasopharyngeal(NP) swabs in vial transport medium  Result Value Ref Range Status   SARS Coronavirus 2 by RT PCR NEGATIVE NEGATIVE Final    Comment: (NOTE) SARS-CoV-2 target nucleic acids are NOT DETECTED.  The SARS-CoV-2 RNA is generally detectable in upper respiratory specimens during the acute phase of infection. The lowest concentration of SARS-CoV-2 viral copies this assay can detect is 138 copies/mL. A negative result does not preclude SARS-Cov-2 infection and should not be used as the sole basis for treatment or other patient management decisions. A negative result may occur with  improper specimen collection/handling, submission of specimen other than nasopharyngeal swab, presence of viral mutation(s) within the areas targeted by this assay, and inadequate number of viral copies(<138 copies/mL). A negative result must be combined with clinical observations, patient history, and epidemiological information. The expected result is Negative.  Fact Sheet for Patients:   BloggerCourse.com  Fact Sheet for Healthcare Providers:  SeriousBroker.it  This test is no t yet approved or cleared by the [place] FDA and  has been authorized for detection and/or diagnosis of SARS-CoV-2 by FDA under an Emergency Use Authorization (EUA). This EUA will remain  in effect (meaning this test can be used) for the duration of the COVID-19 declaration under Section 564(b)(1) of the Act, 21 U.S.C.section 360bbb-3(b)(1), unless the authorization is terminated  or revoked sooner.       Influenza A by PCR NEGATIVE NEGATIVE Final   Influenza B by PCR NEGATIVE NEGATIVE Final    Comment: (NOTE) The Xpert Xpress SARS-CoV-2/FLU/RSV plus assay is intended as an aid in the diagnosis of influenza from Nasopharyngeal swab specimens and should not be used as a sole basis for treatment. Nasal washings and aspirates are unacceptable for Xpert Xpress SARS-CoV-2/FLU/RSV testing.  Fact Sheet for Patients: BloggerCourse.com  Fact Sheet for Healthcare Providers: SeriousBroker.it  This test is not yet approved or cleared by the [place] FDA and has been authorized for detection and/or diagnosis of SARS-CoV-2 by FDA under an Emergency Use Authorization (EUA). This EUA will remain in effect (meaning this test can be used) for the duration of the COVID-19 declaration under Section 564(b)(1) of the Act, 21 U.S.C. section 360bbb-3(b)(1), unless the authorization is terminated or revoked.  Performed at [facility], [address], [place], [place] [address]   C Difficile Quick Screen w PCR reflex     Status: None   Collection Time: 05/02/20  3:39 PM   Specimen: STOOL  Result Value Ref Range Status   C Diff antigen NEGATIVE NEGATIVE Final   C Diff toxin NEGATIVE NEGATIVE Final   C Diff interpretation No C. difficile detected.  Final    Comment: Performed at [facility],  [address], [place], [place] [address]         Radiology Studies: CT CHEST ABDOMEN PELVIS W CONTRAST  Result Date: 05/02/2020 CLINICAL DATA:  Sepsis.  Community-acquired pneumonia. EXAM: CT CHEST, ABDOMEN, AND PELVIS WITH CONTRAST TECHNIQUE: Multidetector CT imaging of the chest, abdomen and pelvis was performed following the standard protocol during bolus administration of intravenous contrast. CONTRAST:  OMNIPAQUE IOHEXOL 300 MG/ML  SOLN COMPARISON:  No comparison studies available. FINDINGS: CT CHEST FINDINGS Cardiovascular: The heart size is normal. No substantial pericardial effusion. Coronary artery calcification is evident. Mitral annular calcification noted. Atherosclerotic calcification is noted in the wall of the thoracic aorta.  Mediastinum/Nodes: Mediastinal lymphadenopathy evident. 15 mm short axis precarinal node shows central necrosis. 2.3 cm short axis subcarinal lymph node evident. No left hilar lymphadenopathy. Soft tissue fullness noted in the right hilum. Moderate to large hiatal hernia. There is no axillary lymphadenopathy. Lungs/Pleura: Dense consolidative opacity noted in the right lower lobe with 3 cm central low-density lesion in the lung parenchyma. This is associated with mild left base collapse/consolidation, moderate right pleural effusion and small left pleural effusion. Patchy areas of peripheral airspace disease are noted in the left upper lobe. Musculoskeletal: Bones are diffusely demineralized age-indeterminate nondisplaced fractures of the anterior right third and fourth ribs noted. Degenerative changes are evident in both shoulders, right greater than left. CT ABDOMEN PELVIS FINDINGS Hepatobiliary: No suspicious focal abnormality within the liver parenchyma. Gallbladder is distended. No intrahepatic or extrahepatic biliary dilation. Pancreas: No focal mass lesion. No dilatation of the main duct. No intraparenchymal cyst. No peripancreatic edema. Spleen: No splenomegaly.  No focal mass lesion. Adrenals/Urinary Tract: No adrenal nodule or mass. Kidneys unremarkable. No evidence for hydroureter. The urinary bladder appears normal for the degree of distention. Stomach/Bowel: Moderate to large hiatal hernia Duodenum is normally positioned as is the ligament of Treitz. No small bowel wall thickening. No small bowel dilatation. The terminal ileum is normal. The appendix is not well visualized, but there is no edema or inflammation in the region of the cecum. Mild wall thickening noted in the sigmoid colon, potentially related to underdistention although infectious/inflammatory colitis not excluded. Vascular/Lymphatic: There is abdominal aortic atherosclerosis without aneurysm. There is no gastrohepatic or hepatoduodenal ligament lymphadenopathy. No retroperitoneal or mesenteric lymphadenopathy. No pelvic sidewall lymphadenopathy. Reproductive: Uterus surgically absent.  There is no adnexal mass. Other: Trace free fluid seen in the pelvis and in the left paracolic gutter. Musculoskeletal: No worrisome lytic or sclerotic osseous abnormality. Advanced degenerative disc disease noted lumbar spine. IMPRESSION: 1. Dense consolidative opacity in the right lower lobe with 3 cm central low-density lesion in the lung parenchyma. This lesion may be related to necrotic pneumonia/pulmonary abscess although neoplasm not excluded. 2. Mediastinal and right hilar lymphadenopathy is. Subcarinal lymph node is somewhat more bulky than typically seen for reactive etiology and metastatic disease is a concern. 3. Patchy areas of peripheral airspace disease in the left upper lobe, likely infectious/inflammatory. 4. Moderate to large hiatal hernia. 5. Mild wall thickening in the sigmoid colon, potentially related to underdistention although infectious/inflammatory colitis not excluded. 6. Trace free fluid in the pelvis and left paracolic gutter. 7. Aortic Atherosclerosis (ICD10-I70.0). Electronically Signed   By:  Kennith Center M.D.   On: 05/02/2020 14:21   DG Chest Port 1 View  Result Date: 05/01/2020 CLINICAL DATA:  Sepsis. EXAM: PORTABLE CHEST 1 VIEW COMPARISON:  January 10, 2019. FINDINGS: Stable cardiomediastinal silhouette. Stable hiatal hernia. No pneumothorax is noted. Left lung is clear. New large rounded opacity is noted in right lower lobe which may represent pneumonia, but neoplasm cannot be excluded. Mild right basilar subsegmental atelectasis and right pleural effusion is noted as well. Bony thorax is unremarkable. IMPRESSION: New large ill-defined rounded opacity is noted in right lower lobe which may represent pneumonia, but neoplasm cannot be excluded. CT scan of the chest with intravenous contrast is recommended for further evaluation. Aortic Atherosclerosis (ICD10-I70.0). Electronically Signed   By: Lupita Raider M.D.   On: 05/01/2020 12:56   DG HIPS BILAT WITH PELVIS MIN 5 VIEWS  Result Date: 05/01/2020 CLINICAL DATA:  Left hip pain after possible fall. EXAM: DG  HIP (WITH OR WITHOUT PELVIS) 5+V BILAT COMPARISON:  None. FINDINGS: There is no evidence of hip fracture or dislocation. Moderate narrowing of the right hip joint is noted. IMPRESSION: Moderate osteoarthritis of the right hip. No acute abnormality is noted in either hip. Electronically Signed   By: Lupita RaiderJames  Green Jr M.D.   On: 05/01/2020 12:59        Scheduled Meds: . guaiFENesin  600 mg Oral BID  . latanoprost  1 drop Both Eyes QHS  . levETIRAcetam  500 mg Oral BID  . pantoprazole (PROTONIX) IV  40 mg Intravenous Q12H  . risperiDONE  0.5 mg Oral BID   Continuous Infusions: . sodium chloride 100 mL/hr at 05/02/20 0755  . azithromycin Stopped (05/02/20 0753)  . cefTRIAXone (ROCEPHIN)  IV Stopped (05/02/20 0753)     LOS: 0 days    Time spent: 35mins    Erick BlinksJehanzeb Rylan Bernard, MD [facility]   If 7PM-7AM, please contact night-coverage [email or web address]  05/02/2020, 7:14 PM

## 2020-05-02 NOTE — Plan of Care (Signed)
  Problem: Education: Goal: Knowledge of General Education information will improve Description: Including pain rating scale, medication(s)/side effects and non-pharmacologic comfort measures Outcome: Progressing   Problem: Health Behavior/Discharge Planning: Goal: Ability to manage health-related needs will improve Outcome: Progressing   Problem: Clinical Measurements: Goal: Ability to maintain clinical measurements within normal limits will improve Outcome: Progressing Goal: Will remain free from infection Outcome: Progressing Goal: Diagnostic test results will improve Outcome: Progressing Goal: Respiratory complications will improve Outcome: Progressing Goal: Cardiovascular complication will be avoided Outcome: Progressing   Problem: Activity: Goal: Risk for activity intolerance will decrease Outcome: Progressing   Problem: Nutrition: Goal: Adequate nutrition will be maintained Outcome: Progressing   Problem: Coping: Goal: Level of anxiety will decrease Outcome: Progressing   Problem: Elimination: Goal: Will not experience complications related to bowel motility Outcome: Progressing Goal: Will not experience complications related to urinary retention Outcome: Progressing   Problem: Pain Managment: Goal: General experience of comfort will improve Outcome: Progressing   Problem: Safety: Goal: Ability to remain free from injury will improve Outcome: Progressing   Problem: Skin Integrity: Goal: Risk for impaired skin integrity will decrease Outcome: Progressing   Problem: Activity: Goal: Ability to tolerate increased activity will improve Outcome: Progressing   Problem: Clinical Measurements: Goal: Ability to maintain a body temperature in the normal range will improve Outcome: Progressing   Problem: Respiratory: Goal: Ability to maintain adequate ventilation will improve Outcome: Progressing Goal: Ability to maintain a clear airway will improve Outcome:  Progressing   Problem: Urinary Elimination: Goal: Signs and symptoms of infection will decrease Outcome: Progressing   

## 2020-05-02 NOTE — Progress Notes (Signed)
Pharmacy Antibiotic Note  Beverly [name] is a 87 y.o. female admitted on 05/01/2020 with pneumonia.  Pharmacy has been consulted for cefepime dosing.  Plan: cefepime 2gm iv q12h   Height: 4\' 10"  (147.3 cm) Weight: 57.1 kg (125 lb 12.8 oz) IBW/kg (Calculated) : 40.9  Temp (24hrs), Avg:98.6 F (37 C), Min:98.5 F (36.9 C), Max:98.7 F (37.1 C)  Recent Labs  Lab 05/01/20 1140 05/01/20 1943 05/02/20 0603  WBC 29.1* 23.0* 21.3*  CREATININE 0.63  --  0.49  LATICACIDVEN 1.1  --   --     Estimated Creatinine Clearance: 38.5 mL/min (by C-G formula based on SCr of 0.49 mg/dL).    Allergies  Allergen Reactions  . Lisinopril Swelling  . Eggs Or Egg-Derived Products Itching    Patient reports that she can eat eggs but does not tolerate egg derived products  . Morphine And Related     Headache  . Penicillins Itching    DID THE REACTION INVOLVE: Swelling of the face/tongue/throat, SOB, or low BP? No Sudden or severe rash/hives, skin peeling, or the inside of the mouth or nose? No Did it require medical treatment? No When did it last happen? Unknown If all above answers are "NO", may proceed with cephalosporin use.     Antimicrobials this admission: 4/30 cefepime >>  4/30 azithromycin >> 4/30 metronidazole >>   Microbiology results: 4/30 CDiff: negative  4/30 BCx: sent  4/30 Ucx: sent 4/30 Resp Panel: negative   Thank you for allowing pharmacy to be a part of this patient's care.  5/30 Jannetta Massey 05/02/2020 7:23 PM

## 2020-05-02 NOTE — Consult Note (Signed)
Referring Provider: No ref. 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  2 to 3 weeks hx of nonbloody, watery diarrhea (in stark contrast to baseline symptoms of constipation -chronic opioids). Seen by PCP recently.  Given Bentyl and IV fluids in the office.  Daughter states APP called in an expensive prescription which they could not fill (I called [facility] and determined that prescription was for Xifaxan) Patient presented to the emergency department found to be lethargic - white count of 29,000.  Acute on chronic anemia noted with a hemoglobin of 7.8;  dense right lobe infiltrate on chest x-ray;  occult blood positive stool. [name], patient's daughter with whom patient lives, accompanies her today and provides a good bit of the history. Patient is described as having a good appetite recently.  No dysphagia symptoms, no nausea, vomiting;  no melena or hematochezia.  No abdominal pain.  Diarrhea with incontinence has been the major GI issue over the past couple of weeks.  History of antibiotic exposure within the last several months (Cipro as prophylaxis per DDS) No stool studies done to this point.  Takes Protonix for some background GERD symptoms which been described as being well controlled for years as well as Celebrex.  Denies any history of peptic ulcer disease.  Denies ever having an EGD.  May have had a colonoscopy by Dr. Karilyn Cota many years ago.  Patient has been admitted to the hospital.  Started on antibiotics.  CT of the chest, abdomen and pelvis are planned.   Past Medical History:  Diagnosis Date  . Anxiety   . Arthritis   . COPD (chronic obstructive  pulmonary disease) (HCC)   . Coronary artery disease    a. STEMI with cardiac arrest (polymorphic VT) s/p DES to RCA.  Marland Kitchen GERD (gastroesophageal reflux disease)   . Headache(784.0)   . Hearing loss, central    Left ear  . Hematoma   . Hypertension   . Memory changes   . Mild aortic stenosis   . Mild carotid artery disease (HCC)    a. 1-39% by duplex 02/2018.  Marland Kitchen Seizures (HCC) 10/2012   No seizures, speech issues; after a fall, hitting head on left side    Past Surgical History:  Procedure Laterality Date  . ABDOMINAL HYSTERECTOMY    . APPENDECTOMY    . CARDIAC CATHETERIZATION     15 yrs ago  . CORONARY/GRAFT ACUTE MI REVASCULARIZATION N/A 01/10/2018   Procedure: Coronary/Graft Acute MI Revascularization;  Surgeon: Runell Gess, MD;  Location: [facility] INVASIVE CV LAB;  Service: Cardiovascular;  Laterality: N/A;  . CRANIOTOMY Left 10/30/2012   Procedure: CRANIOTOMY HEMATOMA EVACUATION SUBDURAL;  Surgeon: Maeola Harman, MD;  Location: [facility] NEURO ORS;  Service: Neurosurgery;  Laterality: Left;  Left Craniotomy for evacuation of subdural hematoma  . JOINT REPLACEMENT Bilateral    knees  . LEFT HEART CATH AND CORONARY ANGIOGRAPHY N/A 01/10/2018   Procedure: LEFT HEART CATH AND CORONARY ANGIOGRAPHY;  Surgeon: Runell Gess, MD;  Location: [facility] INVASIVE CV LAB;  Service: Cardiovascular;  Laterality: N/A;    Prior to Admission medications   Medication Sig Start Date End Date Taking? Authorizing Provider  acetaminophen (TYLENOL) 500 MG tablet Take 1 tablet (500 mg total) by mouth as  needed (no more than 3 gm / day). 12/04/19  Yes Cleaver, Thomasene Ripple, NP  ALPRAZolam Prudy Feeler) 0.5 MG tablet Take 0.5 mg by mouth 3 (three) times daily as needed for anxiety.   Yes [provider]  aspirin 81 MG chewable tablet Chew 1 tablet (81 mg total) by mouth daily. 01/13/18  Yes Georgie Chard D, NP  atorvastatin (LIPITOR) 80 MG tablet Take 1 tablet (80 mg total) by mouth daily at 6 PM. 01/12/18  Yes Georgie Chard D, NP  carvedilol (COREG) 6.25 MG tablet TAKE ONE TABLET (6.25MG  TOTAL) BY MOUTH TWO TIMES DAILY 04/10/20  Yes Jonelle Sidle, MD  celecoxib (CELEBREX) 200 MG capsule Take 200 mg by mouth daily.   Yes [provider]  cholecalciferol (VITAMIN D) 1000 UNITS tablet Take 1,000 Units by mouth daily.    Yes [provider]  fluticasone (FLONASE) 50 MCG/ACT nasal spray Place 1 spray into both nostrils daily.  09/18/12  Yes [provider]  HYDROcodone-acetaminophen (NORCO) 10-325 MG tablet Take 1 tablet by mouth every 6 (six) hours as needed.   Yes [provider]  latanoprost (XALATAN) 0.005 % ophthalmic solution INSTILL ONE DROP IN BOTH EYES NIGHTLY 07/09/19  Yes Rankin, Alford Highland, MD  levETIRAcetam (KEPPRA) 500 MG tablet Take 500 mg by mouth 2 (two) times daily.   Yes [provider]  methocarbamol (ROBAXIN) 500 MG tablet Take 1 tablet by mouth in the morning and at bedtime.  02/22/19  Yes [provider]  nitroGLYCERIN (NITROSTAT) 0.4 MG SL tablet Place 1 tablet (0.4 mg total) under the tongue every 5 (five) minutes x 3 doses as needed for chest pain. 01/12/18  Yes Georgie Chard D, NP  pantoprazole (PROTONIX) 40 MG tablet Take 40 mg by mouth daily.   Yes [provider]  potassium chloride (K-DUR) 10 MEQ tablet Take 10 mEq by mouth daily.    Yes [provider]  Propylene Glycol (SYSTANE BALANCE OP) Place 1-2 drops into both eyes daily as needed (dry eyes).   Yes [provider]  risperiDONE (RISPERDAL) 0.5 MG tablet Take 0.5 mg by mouth 2 (two) times daily. 04/23/20  Yes [provider]  amLODipine (NORVASC) 5 MG tablet Take 1 tablet (5 mg total) by mouth daily. 05/29/19 12/04/19  Jonelle Sidle, MD  lisinopril (ZESTRIL) 20 MG tablet Take 1 tablet (20 mg total) by mouth 2 (two) times daily. 01/24/19 05/03/19  Ellsworth Lennox, PA-C    Current Facility-Administered Medications  Medication Dose Route Frequency  Provider Last Rate Last Admin  . 0.9 %  sodium chloride infusion   Intravenous Continuous Erick Blinks, MD 100 mL/hr at 05/02/20 0755 New Bag at 05/02/20 0755  . acetaminophen (TYLENOL) tablet 650 mg  650 mg Oral Q6H PRN Erick Blinks, MD       Or  . acetaminophen (TYLENOL) suppository 650 mg  650 mg Rectal Q6H PRN Erick Blinks, MD      . albuterol (PROVENTIL) (2.5 MG/3ML) 0.083% nebulizer solution 2.5 mg  2.5 mg Nebulization Q2H PRN Erick Blinks, MD      . azithromycin (ZITHROMAX) 500 mg in sodium chloride 0.9 % 250 mL IVPB  500 mg Intravenous Q24H Erick Blinks, MD   Stopped at 05/02/20 0753  . cefTRIAXone (ROCEPHIN) 2 g in sodium chloride 0.9 % 100 mL IVPB  2 g Intravenous Q24H Erick Blinks, MD   Stopping Infusion hung by another clincian at 05/02/20 0753  . guaiFENesin (MUCINEX) 12 hr tablet  600 mg  600 mg Oral BID Erick Blinks, MD      . guaiFENesin (ROBITUSSIN) 100 MG/5ML solution 100 mg  5 mL Oral Q4H PRN Zierle-Ghosh, Asia B, DO   100 mg at 05/02/20 0127  . HYDROcodone-acetaminophen (NORCO) 10-325 MG per tablet 1 tablet  1 tablet Oral Q6H PRN Erick Blinks, MD   1 tablet at 05/02/20 0636  . latanoprost (XALATAN) 0.005 % ophthalmic solution 1 drop  1 drop Both Eyes QHS Memon, Durward Mallard, MD      . levETIRAcetam (KEPPRA) tablet 500 mg  500 mg Oral BID Erick Blinks, MD      . ondansetron (ZOFRAN) tablet 4 mg  4 mg Oral Q6H PRN Erick Blinks, MD       Or  . ondansetron (ZOFRAN) injection 4 mg  4 mg Intravenous Q6H PRN Erick Blinks, MD      . pantoprazole (PROTONIX) injection 40 mg  40 mg Intravenous Q12H Erick Blinks, MD   40 mg at 05/01/20 2014  . risperiDONE (RISPERDAL) tablet 0.5 mg  0.5 mg Oral BID Erick Blinks, MD   0.5 mg at 05/01/20 2014    Allergies as of 05/01/2020 - Review Complete 05/01/2020  Allergen Reaction Noted  . Lisinopril Swelling 05/13/2019  . Eggs or egg-derived products Itching 03/12/2018  . Morphine and related  03/12/2018  .  Penicillins Itching 09/14/2010    Family History  Problem Relation Age of Onset  . Cancer Father   . Ataxia Neg Hx   . Chorea Neg Hx   . Dementia Neg Hx   . Mental retardation Neg Hx   . Migraines Neg Hx   . Multiple sclerosis Neg Hx   . Neurofibromatosis Neg Hx   . Neuropathy Neg Hx   . Parkinsonism Neg Hx   . Seizures Neg Hx   . Stroke Neg Hx     Social History   Socioeconomic History  . Marital status: Married    Spouse name: Not on file  . Number of children: Not on file  . Years of education: Not on file  . Highest education level: Not on file  Occupational History  . Not on file  Tobacco Use  . Smoking status: Never Smoker  . Smokeless tobacco: Never Used  Vaping Use  . Vaping Use: Never used  Substance and Sexual Activity  . Alcohol use: No  . Drug use: No  . Sexual activity: Not on file  Other Topics Concern  . Not on file  Social History Narrative  . Not on file   Social Determinants of Health   Financial Resource Strain: Not on file  Food Insecurity: Not on file  Transportation Needs: Not on file  Physical Activity: Not on file  Stress: Not on file  Social Connections: Not on file  Intimate Partner Violence: Not on file    Review of Systems: As in history of present illness.  Physical Exam: Vital signs in last 24 hours: Temp:  [98.3 F (36.8 C)-100.2 F (37.9 C)] 98.5 F (36.9 C) (04/30 0129) Pulse Rate:  [85-109] 85 (04/30 0129) Resp:  [16-28] 18 (04/30 0129) BP: (97-137)/(48-64) 98/48 (04/30 0129) SpO2:  [95 %-98 %] 95 % (04/30 0129) Weight:  [57.1 kg-72.6 kg] 57.1 kg (04/29 1751) Last BM Date: 05/01/20 General:   Alert, frail, conversant lady who appears comfortable.  Accompanied by her daughter, [name]. Eyes:  Sclera clear, no icterus.   Conjunctiva pink. Lungs:  Clear throughout to auscultation.   No  wheezes, crackles, or rhonchi. No acute distress. Heart:  Regular rate and rhythm; no murmurs, clicks, rubs,  or  gallops. Abdomen: Nondistended.  Positive bowel sounds.  Entirely soft and nontender without appreciable mass organomegaly  Intake/Output from previous day: 04/29 0701 - 04/30 0700 In: 2403.1 [I.V.:707.4; IV Piggyback:1695.8] Out: 450 [Urine:450] Intake/Output this shift: Total I/O In: 1350 [I.V.:1000; IV Piggyback:350] Out: 500 [Urine:500]  Lab Results: Recent Labs    05/01/20 1140 05/01/20 1943 05/02/20 0603  WBC 29.1* 23.0* 21.3*  HGB 7.8* 7.0* 7.4*  HCT 26.0* 22.8* 24.6*  PLT 522* 505* 555*   BMET Recent Labs    05/01/20 1140 05/02/20 0603  NA 131* 133*  K 4.8 4.2  CL 98 100  CO2 26 26  GLUCOSE 109* 96  BUN 31* 21  CREATININE 0.63 0.49  CALCIUM 8.5* 8.5*   LFT Recent Labs    05/02/20 0603  PROT 6.2*  ALBUMIN 1.9*  AST 17  ALT 10  ALKPHOS 121  BILITOT 0.7   PT/INR Recent Labs    05/01/20 1140 05/02/20 0603  LABPROT 14.4 14.8  INR 1.1 1.2   Hepatitis Panel No results for input(s): HEPBSAG, HCVAB, HEPAIGM, HEPBIGM in the last 72 hours. C-Diff No results for input(s): CDIFFTOX in the last 72 hours.  Studies/Results: DG Chest Port 1 View  Result Date: 05/01/2020 CLINICAL DATA:  Sepsis. EXAM: PORTABLE CHEST 1 VIEW COMPARISON:  January 10, 2019. FINDINGS: Stable cardiomediastinal silhouette. Stable hiatal hernia. No pneumothorax is noted. Left lung is clear. New large rounded opacity is noted in right lower lobe which may represent pneumonia, but neoplasm cannot be excluded. Mild right basilar subsegmental atelectasis and right pleural effusion is noted as well. Bony thorax is unremarkable. IMPRESSION: New large ill-defined rounded opacity is noted in right lower lobe which may represent pneumonia, but neoplasm cannot be excluded. CT scan of the chest with intravenous contrast is recommended for further evaluation. Aortic Atherosclerosis (ICD10-I70.0). Electronically Signed   By: Lupita RaiderJames  Green Jr M.D.   On: 05/01/2020 12:56   DG HIPS BILAT WITH PELVIS MIN  5 VIEWS  Result Date: 05/01/2020 CLINICAL DATA:  Left hip pain after possible fall. EXAM: DG HIP (WITH OR WITHOUT PELVIS) 5+V BILAT COMPARISON:  None. FINDINGS: There is no evidence of hip fracture or dislocation. Moderate narrowing of the right hip joint is noted. IMPRESSION: Moderate osteoarthritis of the right hip. No acute abnormality is noted in either hip. Electronically Signed   By: Lupita RaiderJames  Green Jr M.D.   On: 05/01/2020 12:59   Impression: Pleasant 87 year old lady admitted to the hospital with progressive weakness,  recent onset nonbloody diarrhea, acute on chronic anemia with Hemoccult positive stools, right pulmonary infiltrate and leukocytosis. I take note of the fact that this lady is chronically constipated and recently started having diarrhea.  She has been exposed to antibiotics. Need to rule out C. difficile associated diarrhea.  No recent GI evaluation.  History of GERD well-controlled on PPI; concomitant use of Celebrex.  CT of the chest abdomen pelvis have been ordered by the hospitalist.   Recommendations:   #1 continue supportive measures, PPI, trend H&H.  #2 send stool sample for C. difficile toxin testing.  #3 Will review CT scan results as they become available  #4 consider further evaluation of anemia and Hemoccult positive stool via colonoscopy and possible EGD when clinically appropriate.       Notice:  This dictation was prepared with Dragon dictation along with smaller phrase technology. Any transcriptional  errors that result from this process are unintentional and may not be corrected upon review.

## 2020-05-03 DIAGNOSIS — J189 Pneumonia, unspecified organism: Secondary | ICD-10-CM | POA: Diagnosis not present

## 2020-05-03 DIAGNOSIS — D649 Anemia, unspecified: Secondary | ICD-10-CM | POA: Diagnosis not present

## 2020-05-03 DIAGNOSIS — E785 Hyperlipidemia, unspecified: Secondary | ICD-10-CM | POA: Diagnosis not present

## 2020-05-03 DIAGNOSIS — R195 Other fecal abnormalities: Secondary | ICD-10-CM | POA: Diagnosis not present

## 2020-05-03 LAB — BLOOD CULTURE ID PANEL (REFLEXED) - BCID2

## 2020-05-03 LAB — CBC
HCT: 23.7 % — ABNORMAL LOW (ref 36.0–46.0)
Hemoglobin: 7.3 g/dL — ABNORMAL LOW (ref 12.0–15.0)
MCH: 23.9 pg — ABNORMAL LOW (ref 26.0–34.0)
MCHC: 30.8 g/dL (ref 30.0–36.0)
MCV: 77.7 fL — ABNORMAL LOW (ref 80.0–100.0)
Platelets: 548 10*3/uL — ABNORMAL HIGH (ref 150–400)
RBC: 3.05 MIL/uL — ABNORMAL LOW (ref 3.87–5.11)
RDW: 19.3 % — ABNORMAL HIGH (ref 11.5–15.5)
WBC: 15.3 10*3/uL — ABNORMAL HIGH (ref 4.0–10.5)
nRBC: 0 % (ref 0.0–0.2)

## 2020-05-03 LAB — COMPREHENSIVE METABOLIC PANEL
ALT: 12 U/L (ref 0–44)
AST: 19 U/L (ref 15–41)
Albumin: 1.8 g/dL — ABNORMAL LOW (ref 3.5–5.0)
Alkaline Phosphatase: 109 U/L (ref 38–126)
Anion gap: 8 (ref 5–15)
BUN: 12 mg/dL (ref 8–23)
CO2: 25 mmol/L (ref 22–32)
Calcium: 8.2 mg/dL — ABNORMAL LOW (ref 8.9–10.3)
Chloride: 99 mmol/L (ref 98–111)
Creatinine, Ser: 0.42 mg/dL — ABNORMAL LOW (ref 0.44–1.00)
GFR, Estimated: >60 mL/min (ref >60)
Glucose, Bld: 100 mg/dL — ABNORMAL HIGH (ref 70–99)
Potassium: 3.4 mmol/L — ABNORMAL LOW (ref 3.5–5.1)
Sodium: 132 mmol/L — ABNORMAL LOW (ref 135–145)
Total Bilirubin: 0.7 mg/dL (ref 0.3–1.2)
Total Protein: 6.1 g/dL — ABNORMAL LOW (ref 6.5–8.1)

## 2020-05-03 MED ORDER — AMLODIPINE BESYLATE 5 MG PO TABS
5.0000 mg | ORAL_TABLET | Freq: Every day | ORAL | Status: DC
  Administered 2020-05-03: 5 mg via ORAL
  Filled 2020-05-03: qty 1

## 2020-05-03 MED ORDER — CARVEDILOL 3.125 MG PO TABS
6.2500 mg | ORAL_TABLET | Freq: Two times a day (BID) | ORAL | Status: DC
  Administered 2020-05-04 – 2020-05-09 (×11): 6.25 mg via ORAL
  Filled 2020-05-03 (×11): qty 2

## 2020-05-03 NOTE — Evaluation (Signed)
Physical Therapy Evaluation Patient Details Name: Beverly [name] MRN: [id number] DOB: 1932-07-21 Today's Date: 05/03/2020   History of Present Illness  Beverly [name] is a 87 y.o. female with medical history significant of seizure disorder, hypertension, hyperlipidemia, dementia, is brought to the hospital for generalized weakness.  Patient's granddaughter is at bedside and provides history.  Patient has been having intermittent fevers for the past 2 weeks.  She had associated cough.  They have not noticed any significant shortness of breath.  She has had decreased p.o. intake and progressive generalized weakness.  She normally ambulates with a walker.  Due to her progressive weakness, they have had difficulty getting her out of bed.  She had gone to her primary care physician today who gave her some IV fluids.  Unfortunately, her symptoms not improved.  Today she was noted to be less responsive.  There has not been any melena, hematochezia.  No vomiting.  No abdominal pain.  Family does note that she is normally constipated, but has had diarrhea over the past 2 weeks.   Clinical Impression   Patient exhibits new onset of decline in functional independence and mobility requiring increased level of assistance for mobility and ADL.  Demonstrates generalized weakness, balance deficits, unsteadiness on feet, reduced functional activity tolerance, higher risk for falls, and decreased ability to safely ambulate.  Continued PT services indicated to improve strength, balance, and functional activity tolerance to restore capabilities to PLOF    Follow Up Recommendations SNF;Supervision for mobility/OOB;Supervision/Assistance - 24 hour    Equipment Recommendations  None recommended by PT    Recommendations for Other Services OT consult     Precautions / Restrictions        Mobility  Bed Mobility Overal bed mobility: Needs Assistance Bed Mobility: Rolling;Supine to Sit;Sit to Supine Rolling: Min  assist   Supine to sit: Max assist;Mod assist Sit to supine: Mod assist     Patient Response: Cooperative  Transfers Overall transfer level: Needs assistance Equipment used: Rolling walker (2 wheeled) Transfers: Sit to/from [employer] Sit to Stand: Mod assist Stand pivot transfers: Mod assist          Ambulation/Gait Ambulation/Gait assistance: Min assist Gait Distance (Feet): 15 Feet Assistive device: Rolling walker (2 wheeled) Gait Pattern/deviations: Step-to pattern Gait velocity: decreased      Stairs            Wheelchair Mobility    Modified Rankin (Stroke Patients Only)       Balance Overall balance assessment: Needs assistance   Sitting balance-Leahy Scale: Fair   Postural control: Posterior lean Standing balance support: Bilateral upper extremity supported Standing balance-Leahy Scale: Fair               High level balance activites: Side stepping               Pertinent Vitals/Pain Pain Assessment: No/denies pain    Home Living Family/patient expects to be discharged to:: Private residence Living Arrangements: Children Available Help at Discharge: Family;Available 24 hours/day Type of Home: House Home Access: Stairs to enter Entrance Stairs-Rails: Right Entrance Stairs-Number of Steps: 3 Home Layout: One level Home Equipment: Walker - 2 wheels Additional Comments: family members live in home with pt    Prior Function Level of Independence: Independent with assistive device(s)         Comments: household ambulator     Hand Dominance        Extremity/Trunk Assessment   Upper Extremity Assessment Upper  Extremity Assessment: Generalized weakness    Lower Extremity Assessment Lower Extremity Assessment: Generalized weakness       Communication   Communication: No difficulties  Cognition Arousal/Alertness: Lethargic Behavior During Therapy: WFL for tasks assessed/performed Overall  Cognitive Status: No family/caregiver present to determine baseline cognitive functioning                                        General Comments      Exercises     Assessment/Plan    PT Assessment Patient needs continued PT services  PT Problem List Decreased strength;Decreased activity tolerance;Decreased balance;Decreased safety awareness;Cardiopulmonary status limiting activity       PT Treatment Interventions DME instruction;Gait training;Stair training;Functional mobility training;Therapeutic activities;Patient/family education;Balance training;Therapeutic exercise;Manual techniques    PT Goals (Current goals can be found in the Care Plan section)  Acute Rehab PT Goals Patient Stated Goal: Decrease level of assistance PT Goal Formulation: With patient Time For Goal Achievement: 05/09/20 Potential to Achieve Goals: Good    Frequency Min 3X/week   Barriers to discharge Other (comment) level of assistance needed at this time    Co-evaluation               AM-PAC PT "6 Clicks" Mobility  Outcome Measure Help needed turning from your back to your side while in a flat bed without using bedrails?: A Little Help needed moving from lying on your back to sitting on the side of a flat bed without using bedrails?: A Lot Help needed moving to and from a bed to a chair (including a wheelchair)?: A Little Help needed standing up from a chair using your arms (e.g., wheelchair or bedside chair)?: A Little Help needed to walk in hospital room?: A Lot Help needed climbing 3-5 steps with a railing? : A Lot 6 Click Score: 15    End of Session Equipment Utilized During Treatment: Gait belt Activity Tolerance: Patient limited by fatigue Patient left: in bed;with bed alarm set Nurse Communication: Mobility status PT Visit Diagnosis: Unsteadiness on feet (R26.81);Other abnormalities of gait and mobility (R26.89);Muscle weakness (generalized) (M62.81);Difficulty in  walking, not elsewhere classified (R26.2)    Time: 0100-7121 PT Time Calculation (min) (ACUTE ONLY): 30 min   Charges:   PT Evaluation $PT Eval Low Complexity: 1 Low PT Treatments $Gait Training: 8-22 mins       12:06 PM, 05/03/20 M. Shary Decamp, PT, DPT Physical Therapist- [place] Office Number: [phone]

## 2020-05-03 NOTE — Plan of Care (Signed)
  Problem: Acute Rehab PT Goals(only PT should resolve) Goal: Pt will Roll Supine to Side Outcome: Progressing Flowsheets (Taken 05/03/2020 1207) Pt will Roll Supine to Side: with supervision Goal: Pt Will Go Supine/Side To Sit Outcome: Progressing Flowsheets (Taken 05/03/2020 1207) Pt will go Supine/Side to Sit: with supervision Goal: Pt Will Go Sit To Supine/Side Outcome: Progressing Flowsheets (Taken 05/03/2020 1207) Pt will go Sit to Supine/Side: with supervision Goal: Patient Will Transfer Sit To/From Stand Outcome: Progressing Flowsheets (Taken 05/03/2020 1207) Patient will transfer sit to/from stand: with supervision Goal: Pt Will Transfer Bed To Chair/Chair To Bed Outcome: Progressing Flowsheets (Taken 05/03/2020 1207) Pt will Transfer Bed to Chair/Chair to Bed: with supervision Goal: Pt Will Ambulate Outcome: Progressing Flowsheets (Taken 05/03/2020 1207) Pt will Ambulate:  75 feet  with supervision  with rolling walker Goal: Pt Will Go Up/Down Stairs Outcome: Progressing Flowsheets (Taken 05/03/2020 1207) Pt will Go Up / Down Stairs:  3-5 stairs  with min guard assist  with rail(s)   12:08 PM, 05/03/20 M. Shary Decamp, PT, DPT Physical Therapist- [place] Office Number: [phone]

## 2020-05-03 NOTE — Progress Notes (Addendum)
Pt denies abdominal pain; no overt GI bleeding.  No report of diarrhea.  Vital signs in last 24 hours: Temp:  [99.5 F (37.5 C)] 99.5 F (37.5 C) (05/01 0428) Pulse Rate:  [88-111] 101 (05/01 0428) Resp:  [20] 20 (05/01 0428) BP: (142-164)/(74-84) 164/84 (05/01 0428) SpO2:  [95 %] 95 % (05/01 0428) Last BM Date: 05/02/20 General:   Alert,  pleasant and cooperative in NAD Abdomen:   Extremities:  Without clubbing or edema.    Intake/Output from previous day: 04/30 0701 - 05/01 0700 In: 3105.6 [P.O.:300; I.V.:2005.6; IV Piggyback:800] Out: 1900 [Urine:1900] Intake/Output this shift: Total I/O In: -  Out: 450 [Urine:450]  Lab Results: Recent Labs    05/01/20 1943 05/02/20 0603 05/03/20 0455  WBC 23.0* 21.3* 15.3*  HGB 7.0* 7.4* 7.3*  HCT 22.8* 24.6* 23.7*  PLT 505* 555* 548*   BMET Recent Labs    05/01/20 1140 05/02/20 0603 05/03/20 0455  NA 131* 133* 132*  K 4.8 4.2 3.4*  CL 98 100 99  CO2 26 26 25   GLUCOSE 109* 96 100*  BUN 31* 21 12  CREATININE 0.63 0.49 0.42*  CALCIUM 8.5* 8.5* 8.2*   LFT Recent Labs    05/03/20 0455  PROT 6.1*  ALBUMIN 1.8*  AST 19  ALT 12  ALKPHOS 109  BILITOT 0.7   PT/INR Recent Labs    05/01/20 1140 05/02/20 0603  LABPROT 14.4 14.8  INR 1.1 1.2   Hepatitis Panel No results for input(s): HEPBSAG, HCVAB, HEPAIGM, HEPBIGM in the last 72 hours. C-Diff Recent Labs    05/02/20 1539  CDIFFTOX NEGATIVE    Studies/Results: CT CHEST ABDOMEN PELVIS W CONTRAST  Result Date: 05/02/2020 CLINICAL DATA:  Sepsis.  Community-acquired pneumonia. EXAM: CT CHEST, ABDOMEN, AND PELVIS WITH CONTRAST TECHNIQUE: Multidetector CT imaging of the chest, abdomen and pelvis was performed following the standard protocol during bolus administration of intravenous contrast. CONTRAST:  05/04/2020 OMNIPAQUE IOHEXOL 300 MG/ML  SOLN COMPARISON:  No comparison studies available. FINDINGS: CT CHEST FINDINGS Cardiovascular: The heart size is normal. No  substantial pericardial effusion. Coronary artery calcification is evident. Mitral annular calcification noted. Atherosclerotic calcification is noted in the wall of the thoracic aorta. Mediastinum/Nodes: Mediastinal lymphadenopathy evident. 15 mm short axis precarinal node shows central necrosis. 2.3 cm short axis subcarinal lymph node evident. No left hilar lymphadenopathy. Soft tissue fullness noted in the right hilum. Moderate to large hiatal hernia. There is no axillary lymphadenopathy. Lungs/Pleura: Dense consolidative opacity noted in the right lower lobe with 3 cm central low-density lesion in the lung parenchyma. This is associated with mild left base collapse/consolidation, moderate right pleural effusion and small left pleural effusion. Patchy areas of peripheral airspace disease are noted in the left upper lobe. Musculoskeletal: Bones are diffusely demineralized age-indeterminate nondisplaced fractures of the anterior right third and fourth ribs noted. Degenerative changes are evident in both shoulders, right greater than left. CT ABDOMEN PELVIS FINDINGS Hepatobiliary: No suspicious focal abnormality within the liver parenchyma. Gallbladder is distended. No intrahepatic or extrahepatic biliary dilation. Pancreas: No focal mass lesion. No dilatation of the main duct. No intraparenchymal cyst. No peripancreatic edema. Spleen: No splenomegaly. No focal mass lesion. Adrenals/Urinary Tract: No adrenal nodule or mass. Kidneys unremarkable. No evidence for hydroureter. The urinary bladder appears normal for the degree of distention. Stomach/Bowel: Moderate to large hiatal hernia Duodenum is normally positioned as is the ligament of Treitz. No small bowel wall thickening. No small bowel dilatation. The terminal ileum is normal. The appendix  is not well visualized, but there is no edema or inflammation in the region of the cecum. Mild wall thickening noted in the sigmoid colon, potentially related to  underdistention although infectious/inflammatory colitis not excluded. Vascular/Lymphatic: There is abdominal aortic atherosclerosis without aneurysm. There is no gastrohepatic or hepatoduodenal ligament lymphadenopathy. No retroperitoneal or mesenteric lymphadenopathy. No pelvic sidewall lymphadenopathy. Reproductive: Uterus surgically absent.  There is no adnexal mass. Other: Trace free fluid seen in the pelvis and in the left paracolic gutter. Musculoskeletal: No worrisome lytic or sclerotic osseous abnormality. Advanced degenerative disc disease noted lumbar spine. IMPRESSION: 1. Dense consolidative opacity in the right lower lobe with 3 cm central low-density lesion in the lung parenchyma. This lesion may be related to necrotic pneumonia/pulmonary abscess although neoplasm not excluded. 2. Mediastinal and right hilar lymphadenopathy is. Subcarinal lymph node is somewhat more bulky than typically seen for reactive etiology and metastatic disease is a concern. 3. Patchy areas of peripheral airspace disease in the left upper lobe, likely infectious/inflammatory. 4. Moderate to large hiatal hernia. 5. Mild wall thickening in the sigmoid colon, potentially related to underdistention although infectious/inflammatory colitis not excluded. 6. Trace free fluid in the pelvis and left paracolic gutter. 7. Aortic Atherosclerosis (ICD10-I70.0). Electronically Signed   By: Kennith Center M.D.   On: 05/02/2020 14:21   DG Chest Port 1 View  Result Date: 05/01/2020 CLINICAL DATA:  Sepsis. EXAM: PORTABLE CHEST 1 VIEW COMPARISON:  January 10, 2019. FINDINGS: Stable cardiomediastinal silhouette. Stable hiatal hernia. No pneumothorax is noted. Left lung is clear. New large rounded opacity is noted in right lower lobe which may represent pneumonia, but neoplasm cannot be excluded. Mild right basilar subsegmental atelectasis and right pleural effusion is noted as well. Bony thorax is unremarkable. IMPRESSION: New large ill-defined  rounded opacity is noted in right lower lobe which may represent pneumonia, but neoplasm cannot be excluded. CT scan of the chest with intravenous contrast is recommended for further evaluation. Aortic Atherosclerosis (ICD10-I70.0). Electronically Signed   By: Lupita Raider M.D.   On: 05/01/2020 12:56   DG HIPS BILAT WITH PELVIS MIN 5 VIEWS  Result Date: 05/01/2020 CLINICAL DATA:  Left hip pain after possible fall. EXAM: DG HIP (WITH OR WITHOUT PELVIS) 5+V BILAT COMPARISON:  None. FINDINGS: There is no evidence of hip fracture or dislocation. Moderate narrowing of the right hip joint is noted. IMPRESSION: Moderate osteoarthritis of the right hip. No acute abnormality is noted in either hip. Electronically Signed   By: Lupita Raider M.D.   On: 05/01/2020 12:59   CT abdomen pelvis and chest reviewed.  Moderate size hiatal hernia.  Mild thickening of the sigmoid-nonspecific Right lower lobe infiltrate.    Cdiff negative  Impression: 87 year old lady admitted to the hospital right lower lobe infiltrate, leukocytosis, microcytic anemia and Hemoccult positive stool.  No overt GI bleeding.  Paucity of any GI symptoms at this time.   Leukocytosis has improved-on antibiotics.   Recent diarrhea by hx; Cdiff negative  Recommendations: Continue PPI empirically.  Follow H&H.  She will need a GI evaluation for microcytic anemia/Hemoccult positive stool at some point in the future.  Timing to be determined.  Will continue to follow with you.

## 2020-05-03 NOTE — Progress Notes (Signed)
PROGRESS NOTE    Beverly [name]  ZOX:[id number] DOB: 1932-07-24 DOA: 05/01/2020 PCP: Benita Stabile, MD    Brief Narrative:  87 year old female with a history of seizure disorder, hypertension, hyperlipidemia, mild dementia, admitted to the hospital with generalized weakness, fever and cough.  Found to have significant right lower lobe pneumonia.  CT indicates possible underlying lung abscess versus mass.  She is on IV antibiotics.  Also noted to have heme positive stools and anemia.  GI following.   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 -Currently on IV antibiotics -CT chest shows underlying lung abscess versus mass -We will need pulmonology input on Monday -Continue current antibiotic coverage with cefepime, azithromycin, Flagyl -Sputum culture ordered, but has not been sent as of yet -Strep pneumonia antigen negative, Legionella antigen still in process  Diarrhea -Noted to have significant diarrhea over the past 2 weeks. -CT abdomen does not indicate any clear-cut colitis -Stool for C. difficile is negative -Use Imodium as needed  Urinary tract infection -Urine culture positive for E. Coli -This is being covered by current antibiotics  Anemia -Unclear baseline hemoglobin -She was noted to have heme positive stools -Per patient, she does not have any melena or hematochezia -Continue on PPI -Anemia panel indicates iron deficiency -Seen by GI, appreciate input -She will need EGD/colonoscopy once medically stable -Can consider iron infusion once infectious process has stabilized -Overall hemoglobin appears to be stable -Transfuse for hemoglobin less than  7  Seizure disorder -Continue on Keppra  Hyperlipidemia -Continue on statin  Hypertension -Blood pressure is trending up -Resume Coreg and amlodipine  Anxiety -Continue on home dose of Xanax  Generalized weakness -Seen by physical therapy with recommendations for skilled nursing facility placement   DVT prophylaxis: SCDs Start: 05/01/20 1907  Code Status: DNR Family Communication: Discussed with son at the bedside Disposition Plan: Status is: Inpatient  The patient will require care spanning > 2 midnights and should be moved to inpatient because: IV treatments appropriate due to intensity of illness or inability to take PO  Dispo: The patient is from: Home              Anticipated d/c is to: TBD, may need placement              Patient currently is not medically stable to d/c.   Difficult to place patient No         Consultants:   GI  Procedures:     Antimicrobials:   Ceftriaxone 4/29 > 4/30  Cefepime 4/30 >  Azithromycin 4/29 >  Flagyl 4/30 >   Subjective: She feels as though her appetite is coming back.  Feels that shortness of breath is improving.  Objective: Vitals:   05/02/20 2121 05/02/20 2200 05/03/20 0428 05/03/20 1325  BP: (!) 142/74  (!) 164/84 (!) 182/87  Pulse: (!) 111 88 (!) 101 99  Resp: 20  20 18   Temp:   99.5 F (37.5 C) 99 F (37.2 C)  TempSrc:   Oral Oral  SpO2: 95%  95% 97%  Weight:      Height:        Intake/Output Summary (Last 24 hours) at 05/03/2020 07/03/2020 Last data filed  at 05/03/2020 1700 Gross per 24 hour  Intake 1650.63 ml  Output 3150 ml  Net -1499.37 ml   Filed Weights   05/01/20 1127 05/01/20 1751  Weight: 72.6 kg 57.1 kg    Examination:  General exam: Alert, awake, oriented x 3 Respiratory system: Diminished breath sounds at right base. Respiratory effort normal. Cardiovascular system:RRR. No murmurs, rubs, gallops. Gastrointestinal system: Abdomen is nondistended, soft and nontender. No  organomegaly or masses felt. Normal bowel sounds heard. Central nervous system: Alert and oriented. No focal neurological deficits. Extremities: No C/C/E, +pedal pulses Skin: No rashes, lesions or ulcers Psychiatry: Judgement and insight appear normal. Mood & affect appropriate.       Data Reviewed: I have personally reviewed following labs and imaging studies  CBC: Recent Labs  Lab 05/01/20 1140 05/01/20 1943 05/02/20 0603 05/03/20 0455  WBC 29.1* 23.0* 21.3* 15.3*  NEUTROABS 25.1* 19.5*  --   --   HGB 7.8* 7.0* 7.4* 7.3*  HCT 26.0* 22.8* 24.6* 23.7*  MCV 80.5 79.4* 79.4* 77.7*  PLT 522* 505* 555* 548*   Basic Metabolic Panel: Recent Labs  Lab 05/01/20 1140 05/02/20 0603 05/03/20 0455  NA 131* 133* 132*  K 4.8 4.2 3.4*  CL 98 100 99  CO2 26 26 25   GLUCOSE 109* 96 100*  BUN 31* 21 12  CREATININE 0.63 0.49 0.42*  CALCIUM 8.5* 8.5* 8.2*   GFR: Estimated Creatinine Clearance: 38.5 mL/min (A) (by C-G formula based on SCr of 0.42 mg/dL (L)). Liver Function Tests: Recent Labs  Lab 05/01/20 1140 05/02/20 0603 05/03/20 0455  AST 14* 17 19  ALT 10 10 12   ALKPHOS 129* 121 109  BILITOT 0.6 0.7 0.7  PROT 6.5 6.2* 6.1*  ALBUMIN 2.1* 1.9* 1.8*   No results for input(s): LIPASE, AMYLASE in the last 168 hours. No results for input(s): AMMONIA in the last 168 hours. Coagulation Profile: Recent Labs  Lab 05/01/20 1140 05/02/20 0603  INR 1.1 1.2   Cardiac Enzymes: No results for input(s): CKTOTAL, CKMB, CKMBINDEX, TROPONINI in the last 168 hours. BNP (last 3 results) No results for input(s): PROBNP in the last 8760 hours. HbA1C: No results for input(s): HGBA1C in the last 72 hours. CBG: No results for input(s): GLUCAP in the last 168 hours. Lipid Profile: No results for input(s): CHOL, HDL, LDLCALC, TRIG, CHOLHDL, LDLDIRECT in the last 72 hours. Thyroid Function Tests: No results for input(s): TSH, T4TOTAL, FREET4, T3FREE, THYROIDAB in the last 72  hours. Anemia Panel: Recent Labs    05/01/20 1943  VITAMINB12 1,840*  FOLATE 13.7  FERRITIN 70  TIBC UNABLE TO CALCULATE DUE TO LINEARITY  IRON <5*  RETICCTPCT 1.1   Sepsis Labs: Recent Labs  Lab 05/01/20 1140  LATICACIDVEN 1.1    Recent Results (from the past 240 hour(s))  Blood Culture (routine x 2)     Status: None (Preliminary result)   Collection Time: 05/01/20 11:41 AM   Specimen: BLOOD  Result Value Ref Range Status   Specimen Description BLOOD RIGHT ARM  Final   Special Requests   Final    BOTTLES DRAWN AEROBIC AND ANAEROBIC Blood Culture adequate volume   Culture   Final    NO GROWTH 2 DAYS Performed at [facility], [address] [place], [address] [place]    Report Status PENDING  Incomplete  Blood Culture (routine x 2)     Status: None (Preliminary result)   Collection Time: 05/01/20 11:42 AM   Specimen: BLOOD  Result Value  Ref Range Status   Specimen Description BLOOD LEFT ARM  Final   Special Requests   Final    BOTTLES DRAWN AEROBIC AND ANAEROBIC Blood Culture adequate volume   Culture  Setup Time   Final    GRAM POSITIVE COCCI ANAEROBIC BOTTLE Gram Stain Report Called to,Read Back By and Verified With: HOWARDSON,M@1416  BY MATTHEWS, B 5.1.22    Culture   Final    NO GROWTH 2 DAYS Performed at [facility], [address], [place], [place] [address]    Report Status PENDING  Incomplete  Urine culture     Status: Abnormal (Preliminary result)   Collection Time: 05/01/20 12:15 PM   Specimen: Urine, Clean Catch  Result Value Ref Range Status   Specimen Description   Final    URINE, CLEAN CATCH Performed at [facility], [address] [place], [place] [address]    Special Requests   Final    NONE Performed at [facility], [address] [place], [place] [address]    Culture (A)  Final    >=100,000 COLONIES/mL ESCHERICHIA COLI SUSCEPTIBILITIES TO FOLLOW Performed at [facility], [address] [place], [place] [address]    Report  Status PENDING  Incomplete  Resp Panel by RT-PCR (Flu A&B, Covid) Nasopharyngeal Swab     Status: None   Collection Time: 05/01/20 12:15 PM   Specimen: Nasopharyngeal Swab; Nasopharyngeal(NP) swabs in vial transport medium  Result Value Ref Range Status   SARS Coronavirus 2 by RT PCR NEGATIVE NEGATIVE Final    Comment: (NOTE) SARS-CoV-2 target nucleic acids are NOT DETECTED.  The SARS-CoV-2 RNA is generally detectable in upper respiratory specimens during the acute phase of infection. The lowest concentration of SARS-CoV-2 viral copies this assay can detect is 138 copies/mL. A negative result does not preclude SARS-Cov-2 infection and should not be used as the sole basis for treatment or other patient management decisions. A negative result may occur with  improper specimen collection/handling, submission of specimen other than nasopharyngeal swab, presence of viral mutation(s) within the areas targeted by this assay, and inadequate number of viral copies(<138 copies/mL). A negative result must be combined with clinical observations, patient history, and epidemiological information. The expected result is Negative.  Fact Sheet for Patients:  BloggerCourse.com  Fact Sheet for Healthcare Providers:  SeriousBroker.it  This test is no t yet approved or cleared by the [place] FDA and  has been authorized for detection and/or diagnosis of SARS-CoV-2 by FDA under an Emergency Use Authorization (EUA). This EUA will remain  in effect (meaning this test can be used) for the duration of the COVID-19 declaration under Section 564(b)(1) of the Act, 21 U.S.C.section 360bbb-3(b)(1), unless the authorization is terminated  or revoked sooner.       Influenza A by PCR NEGATIVE NEGATIVE Final   Influenza B by PCR NEGATIVE NEGATIVE Final    Comment: (NOTE) The Xpert Xpress SARS-CoV-2/FLU/RSV plus assay is intended as an aid in the diagnosis  of influenza from Nasopharyngeal swab specimens and should not be used as a sole basis for treatment. Nasal washings and aspirates are unacceptable for Xpert Xpress SARS-CoV-2/FLU/RSV testing.  Fact Sheet for Patients: BloggerCourse.com  Fact Sheet for Healthcare Providers: SeriousBroker.it  This test is not yet approved or cleared by the [place] FDA and has been authorized for detection and/or diagnosis of SARS-CoV-2 by FDA under an Emergency Use Authorization (EUA). This EUA will remain in effect (meaning this test can be used) for the  duration of the COVID-19 declaration under Section 564(b)(1) of the Act, 21 U.S.C. section 360bbb-3(b)(1), unless the authorization is terminated or revoked.  Performed at [facility], [address] [place], [place] [address]   C Difficile Quick Screen w PCR reflex     Status: None   Collection Time: 05/02/20  3:39 PM   Specimen: STOOL  Result Value Ref Range Status   C Diff antigen NEGATIVE NEGATIVE Final   C Diff toxin NEGATIVE NEGATIVE Final   C Diff interpretation No C. difficile detected.  Final    Comment: Performed at [facility], [address] [place], [place] [address]         Radiology Studies: CT CHEST ABDOMEN PELVIS W CONTRAST  Result Date: 05/02/2020 CLINICAL DATA:  Sepsis.  Community-acquired pneumonia. EXAM: CT CHEST, ABDOMEN, AND PELVIS WITH CONTRAST TECHNIQUE: Multidetector CT imaging of the chest, abdomen and pelvis was performed following the standard protocol during bolus administration of intravenous contrast. CONTRAST:  100mL OMNIPAQUE IOHEXOL 300 MG/ML  SOLN COMPARISON:  No comparison studies available. FINDINGS: CT CHEST FINDINGS Cardiovascular: The heart size is normal. No substantial pericardial effusion. Coronary artery calcification is evident. Mitral annular calcification noted. Atherosclerotic calcification is noted in the wall of the thoracic aorta.  Mediastinum/Nodes: Mediastinal lymphadenopathy evident. 15 mm short axis precarinal node shows central necrosis. 2.3 cm short axis subcarinal lymph node evident. No left hilar lymphadenopathy. Soft tissue fullness noted in the right hilum. Moderate to large hiatal hernia. There is no axillary lymphadenopathy. Lungs/Pleura: Dense consolidative opacity noted in the right lower lobe with 3 cm central low-density lesion in the lung parenchyma. This is associated with mild left base collapse/consolidation, moderate right pleural effusion and small left pleural effusion. Patchy areas of peripheral airspace disease are noted in the left upper lobe. Musculoskeletal: Bones are diffusely demineralized age-indeterminate nondisplaced fractures of the anterior right third and fourth ribs noted. Degenerative changes are evident in both shoulders, right greater than left. CT ABDOMEN PELVIS FINDINGS Hepatobiliary: No suspicious focal abnormality within the liver parenchyma. Gallbladder is distended. No intrahepatic or extrahepatic biliary dilation. Pancreas: No focal mass lesion. No dilatation of the main duct. No intraparenchymal cyst. No peripancreatic edema. Spleen: No splenomegaly. No focal mass lesion. Adrenals/Urinary Tract: No adrenal nodule or mass. Kidneys unremarkable. No evidence for hydroureter. The urinary bladder appears normal for the degree of distention. Stomach/Bowel: Moderate to large hiatal hernia Duodenum is normally positioned as is the ligament of Treitz. No small bowel wall thickening. No small bowel dilatation. The terminal ileum is normal. The appendix is not well visualized, but there is no edema or inflammation in the region of the cecum. Mild wall thickening noted in the sigmoid colon, potentially related to underdistention although infectious/inflammatory colitis not excluded. Vascular/Lymphatic: There is abdominal aortic atherosclerosis without aneurysm. There is no gastrohepatic or hepatoduodenal  ligament lymphadenopathy. No retroperitoneal or mesenteric lymphadenopathy. No pelvic sidewall lymphadenopathy. Reproductive: Uterus surgically absent.  There is no adnexal mass. Other: Trace free fluid seen in the pelvis and in the left paracolic gutter. Musculoskeletal: No worrisome lytic or sclerotic osseous abnormality. Advanced degenerative disc disease noted lumbar spine. IMPRESSION: 1. Dense consolidative opacity in the right lower lobe with 3 cm central low-density lesion in the lung parenchyma. This lesion may be related to necrotic pneumonia/pulmonary abscess although neoplasm not excluded. 2. Mediastinal and right hilar lymphadenopathy is. Subcarinal lymph node is somewhat more bulky than typically seen for reactive etiology and metastatic disease is a concern. 3. Patchy areas of  peripheral airspace disease in the left upper lobe, likely infectious/inflammatory. 4. Moderate to large hiatal hernia. 5. Mild wall thickening in the sigmoid colon, potentially related to underdistention although infectious/inflammatory colitis not excluded. 6. Trace free fluid in the pelvis and left paracolic gutter. 7. Aortic Atherosclerosis (ICD10-I70.0). Electronically Signed   By: Kennith Center M.D.   On: 05/02/2020 14:21        Scheduled Meds: . amLODipine  5 mg Oral Daily  . [START ON 05/04/2020] carvedilol  6.25 mg Oral BID WC  . guaiFENesin  600 mg Oral BID  . latanoprost  1 drop Both Eyes QHS  . levETIRAcetam  500 mg Oral BID  . pantoprazole (PROTONIX) IV  40 mg Intravenous Q12H  . risperiDONE  0.5 mg Oral BID   Continuous Infusions: . azithromycin 500 mg (05/02/20 2006)  . ceFEPime (MAXIPIME) IV 2 g (05/03/20 1013)  . metronidazole 500 mg (05/03/20 1208)     LOS: 1 day    Time spent:    Erick Blinks, MD [facility]   If 7PM-7AM, please contact night-coverage [email or web address]  05/03/2020, 6:34 PM

## 2020-05-04 DIAGNOSIS — A419 Sepsis, unspecified organism: Secondary | ICD-10-CM | POA: Diagnosis not present

## 2020-05-04 DIAGNOSIS — D500 Iron deficiency anemia secondary to blood loss (chronic): Secondary | ICD-10-CM | POA: Diagnosis not present

## 2020-05-04 DIAGNOSIS — R6520 Severe sepsis without septic shock: Secondary | ICD-10-CM | POA: Diagnosis not present

## 2020-05-04 DIAGNOSIS — E785 Hyperlipidemia, unspecified: Secondary | ICD-10-CM | POA: Diagnosis not present

## 2020-05-04 DIAGNOSIS — J189 Pneumonia, unspecified organism: Secondary | ICD-10-CM | POA: Diagnosis not present

## 2020-05-04 DIAGNOSIS — N179 Acute kidney failure, unspecified: Secondary | ICD-10-CM | POA: Diagnosis not present

## 2020-05-04 DIAGNOSIS — R195 Other fecal abnormalities: Secondary | ICD-10-CM

## 2020-05-04 LAB — CBC WITH DIFFERENTIAL/PLATELET
Abs Immature Granulocytes: 0.15 10*3/uL — ABNORMAL HIGH (ref 0.00–0.07)
Basophils Absolute: 0 10*3/uL (ref 0.0–0.1)
Basophils Relative: 0 %
Eosinophils Absolute: 0 10*3/uL (ref 0.0–0.5)
Eosinophils Relative: 0 %
HCT: 25.3 % — ABNORMAL LOW (ref 36.0–46.0)
Hemoglobin: 8.1 g/dL — ABNORMAL LOW (ref 12.0–15.0)
Immature Granulocytes: 1 %
Lymphocytes Relative: 6 %
Lymphs Abs: 0.9 10*3/uL (ref 0.7–4.0)
MCH: 24.4 pg — ABNORMAL LOW (ref 26.0–34.0)
MCHC: 32 g/dL (ref 30.0–36.0)
MCV: 76.2 fL — ABNORMAL LOW (ref 80.0–100.0)
Monocytes Absolute: 1.8 10*3/uL — ABNORMAL HIGH (ref 0.1–1.0)
Monocytes Relative: 11 %
Neutro Abs: 13.1 10*3/uL — ABNORMAL HIGH (ref 1.7–7.7)
Neutrophils Relative %: 82 %
Platelets: 597 10*3/uL — ABNORMAL HIGH (ref 150–400)
RBC: 3.32 MIL/uL — ABNORMAL LOW (ref 3.87–5.11)
RDW: 18.8 % — ABNORMAL HIGH (ref 11.5–15.5)
WBC: 15.9 10*3/uL — ABNORMAL HIGH (ref 4.0–10.5)
nRBC: 0 % (ref 0.0–0.2)

## 2020-05-04 LAB — CBC
HCT: 25.6 % — ABNORMAL LOW (ref 36.0–46.0)
Hemoglobin: 8 g/dL — ABNORMAL LOW (ref 12.0–15.0)
MCH: 23.7 pg — ABNORMAL LOW (ref 26.0–34.0)
MCHC: 31.3 g/dL (ref 30.0–36.0)
MCV: 76 fL — ABNORMAL LOW (ref 80.0–100.0)
Platelets: 608 10*3/uL — ABNORMAL HIGH (ref 150–400)
RBC: 3.37 MIL/uL — ABNORMAL LOW (ref 3.87–5.11)
RDW: 19 % — ABNORMAL HIGH (ref 11.5–15.5)
WBC: 16.1 10*3/uL — ABNORMAL HIGH (ref 4.0–10.5)
nRBC: 0 % (ref 0.0–0.2)

## 2020-05-04 LAB — RENAL FUNCTION PANEL
Albumin: 2 g/dL — ABNORMAL LOW (ref 3.5–5.0)
Anion gap: 11 (ref 5–15)
BUN: 7 mg/dL — ABNORMAL LOW (ref 8–23)
CO2: 27 mmol/L (ref 22–32)
Calcium: 8.3 mg/dL — ABNORMAL LOW (ref 8.9–10.3)
Chloride: 93 mmol/L — ABNORMAL LOW (ref 98–111)
Creatinine, Ser: 0.4 mg/dL — ABNORMAL LOW (ref 0.44–1.00)
GFR, Estimated: >60 mL/min (ref >60)
Glucose, Bld: 111 mg/dL — ABNORMAL HIGH (ref 70–99)
Phosphorus: 2.1 mg/dL — ABNORMAL LOW (ref 2.5–4.6)
Potassium: 2.7 mmol/L — CL (ref 3.5–5.1)
Sodium: 131 mmol/L — ABNORMAL LOW (ref 135–145)

## 2020-05-04 LAB — PROTIME-INR
INR: 1.2 (ref 0.8–1.2)
Prothrombin Time: 15.3 seconds — ABNORMAL HIGH (ref 11.4–15.2)

## 2020-05-04 LAB — URINE CULTURE: Culture: >=100000 — AB

## 2020-05-04 LAB — LEGIONELLA PNEUMOPHILA SEROGP 1 UR AG: L. pneumophila Serogp 1 Ur Ag: NEGATIVE

## 2020-05-04 LAB — MAGNESIUM: Magnesium: 1.1 mg/dL — ABNORMAL LOW (ref 1.7–2.4)

## 2020-05-04 LAB — PROCALCITONIN: Procalcitonin: 0.82 ng/mL

## 2020-05-04 LAB — LACTIC ACID, PLASMA: Lactic Acid, Venous: 1.1 mmol/L (ref 0.5–1.9)

## 2020-05-04 LAB — APTT: aPTT: 33 seconds (ref 24–36)

## 2020-05-04 MED ORDER — LACTATED RINGERS IV BOLUS (SEPSIS)
1000.0000 mL | Freq: Once | INTRAVENOUS | Status: AC
  Administered 2020-05-04: 1000 mL via INTRAVENOUS

## 2020-05-04 MED ORDER — LACTATED RINGERS IV BOLUS (SEPSIS)
250.0000 mL | Freq: Once | INTRAVENOUS | Status: AC
  Administered 2020-05-04: 250 mL via INTRAVENOUS

## 2020-05-04 MED ORDER — LACTATED RINGERS IV BOLUS (SEPSIS)
500.0000 mL | Freq: Once | INTRAVENOUS | Status: AC
  Administered 2020-05-04: 500 mL via INTRAVENOUS

## 2020-05-04 MED ORDER — MAGNESIUM SULFATE 2 GM/50ML IV SOLN
2.0000 g | Freq: Once | INTRAVENOUS | Status: AC
  Administered 2020-05-04: 2 g via INTRAVENOUS
  Filled 2020-05-04: qty 50

## 2020-05-04 MED ORDER — FLORANEX PO PACK
1.0000 g | PACK | Freq: Three times a day (TID) | ORAL | Status: DC
  Administered 2020-05-05 – 2020-05-09 (×12): 1 g via ORAL
  Filled 2020-05-04 (×21): qty 1

## 2020-05-04 MED ORDER — AMLODIPINE BESYLATE 5 MG PO TABS
10.0000 mg | ORAL_TABLET | Freq: Every day | ORAL | Status: DC
  Administered 2020-05-04 – 2020-05-09 (×6): 10 mg via ORAL
  Filled 2020-05-04 (×6): qty 2

## 2020-05-04 MED ORDER — POTASSIUM CHLORIDE CRYS ER 20 MEQ PO TBCR
60.0000 meq | EXTENDED_RELEASE_TABLET | Freq: Two times a day (BID) | ORAL | Status: DC
  Administered 2020-05-04 – 2020-05-06 (×5): 60 meq via ORAL
  Filled 2020-05-04 (×2): qty 3
  Filled 2020-05-04: qty 6
  Filled 2020-05-04 (×2): qty 3

## 2020-05-04 NOTE — Consult Note (Signed)
NAME:  Beverly [name], MRN:  [id number], DOB:  09/27/1932, LOS: 2 ADMISSION DATE:  05/01/2020, CONSULTATION DATE:  05/04/20 REFERRING MD: [facility]  CHIEF COMPLAINT:  Weak/fever   History of Present Illness:  [id number] yowf never smoker with medical history significant of seizure disorder, hypertension, hyperlipidemia, dementia, is brought to the hospital for generalized weakness.  Patient's granddaughter is at bedside and provides history.  Patient has been having intermittent fevers x 2 weeks assoc with diarrhea.  ED Course: She is noted to be mildly tachycardic, blood pressure is otherwise stable.  Chest x-ray indicated dense right lower lobe consolidation.  She was noted to have elevated WBC count of 29,000.  Renal function was otherwise normal.    Hgb 7.8 with  heme positive stools.  Pertinent  Medical History  Anxiety, Arthritis, COPD (chronic obstructive pulmonary disease) (HCC), Coronary artery disease, GERD (gastroesophageal reflux disease), Headache(784.0), Hearing loss, central, Hematoma, Hypertension, Memory changes, Mild aortic stenosis, Mild carotid artery disease (HCC), and Seizures (HCC) (10/2012).    Significant Hospital Events: Including procedures, antibiotic start and stop dates in addition to other pertinent events   . Bc x  2  4/29 >  1/2 with staph epi >>> . UC  4/29 > E coli over 100 k sensitive to ceph . Zmax 4/29>>> . Roc  4/29 only  . Flagyl 4/30 >>> . Maxepime 4/30 >>> . Urinary legionella antigen 5/2 >>> . Urinary strep antigen  5/2 >>>   Scheduled Meds: . amLODipine  10 mg Oral Daily  . carvedilol  6.25 mg Oral BID WC  . guaiFENesin  600 mg Oral BID  . latanoprost  1 drop Both Eyes QHS  . levETIRAcetam  500 mg Oral BID  . pantoprazole (PROTONIX) IV  40 mg Intravenous Q12H  . potassium chloride  60 mEq Oral BID  . risperiDONE  0.5 mg Oral BID   Continuous Infusions: . azithromycin Stopped (05/03/20 2032)  . ceFEPime (MAXIPIME) IV 2 g (05/04/20 0814)  . lactated  ringers     And  . lactated ringers    . metronidazole 500 mg (05/04/20 0410)   PRN Meds:.acetaminophen **OR** acetaminophen, albuterol, ALPRAZolam, guaiFENesin, HYDROcodone-acetaminophen, ondansetron **OR** ondansetron (ZOFRAN) IV   Interim History / Subjective:  Denies sob/ cough/ cp - no recent dental work or obvious asp hx   Objective   Blood pressure 130/77, pulse 88, temperature 98.8 F (37.1 C), temperature source Oral, resp. rate 20, height 4\' 10"  (1.473 m), weight 57.1 kg, SpO2 97 %.        Intake/Output Summary (Last 24 hours) at 05/04/2020 1321 Last data filed at 05/04/2020 [id number] Gross per 24 hour  Intake 2741.61 ml  Output 1850 ml  Net 891.61 ml   Filed Weights   05/01/20 1127 05/01/20 1751  Weight: 72.6 kg 57.1 kg    Examination: Tmax 101.7 General: pleasant somewhat frail elderly wf/ nad at 30 degrees hob/ puny cough  02 sats 97% on 2lpm  HENT: dentition intac Lungs: decreased bs / dullness R base Cardiovascular: RRR 2-3/6 sme Abdomen: soft/ benign Extremities: warm s calf tenderness/ clubbing Neuro: intact       I personally reviewed images and agree with radiology impression as follows:   Chest CT with contrast 4/30 1. Dense consolidative opacity in the right lower lobe with 3 cm central low-density lesion in the lung parenchyma. This lesion may be related to necrotic pneumonia/pulmonary abscess although neoplasm not excluded. 2. Mediastinal and right hilar lymphadenopathy is. Subcarinal lymph  node is somewhat more bulky than typically seen for reactive etiology and metastatic disease is a concern. 3. Patchy areas of peripheral airspace disease in the left upper lobe, likely infectious/inflammatory. 4. Moderate to large hiatal hernia.     Assessment & Plan:  1)  Likely CAP ? Asp mechanism with central necrosis  Risk evolving to lung abscess strongly favored over lung ca in setting of dementia and location in the RLL assoc with 2 weeks of a febrile  illness  >>> rx with broad abx as planned, no role for FOB here >>> daugter given my card for outpt f/u in 2 weeks with repeat cxr  2) Likely R paranpneumonic R effusion >>> rec Tcentesis to be sure not evolving empyema (if want to continue to be aggressive here   3) acute hypoxemic resp failure - sats ok on 2lpmok  but may need at d/c   Labs   CBC: Recent Labs  Lab 05/01/20 1140 05/01/20 1943 05/02/20 0603 05/03/20 0455 05/04/20 0608 05/04/20 0808  WBC 29.1* 23.0* 21.3* 15.3* 16.1* 15.9*  NEUTROABS 25.1* 19.5*  --   --   --  13.1*  HGB 7.8* 7.0* 7.4* 7.3* 8.0* 8.1*  HCT 26.0* 22.8* 24.6* 23.7* 25.6* 25.3*  MCV 80.5 79.4* 79.4* 77.7* 76.0* 76.2*  PLT 522* 505* 555* 548* 608* 597*    Basic Metabolic Panel: Recent Labs  Lab 05/01/20 1140 05/02/20 0603 05/03/20 0455 05/04/20 0608  NA 131* 133* 132* 131*  K 4.8 4.2 3.4* 2.7*  CL 98 100 99 93*  CO2 26 26 25 27   GLUCOSE 109* 96 100* 111*  BUN 31* 21 12 7*  CREATININE 0.63 0.49 0.42* 0.40*  CALCIUM 8.5* 8.5* 8.2* 8.3*  MG  --   --   --  1.1*  PHOS  --   --   --  2.1*   GFR: Estimated Creatinine Clearance: 38.5 mL/min (A) (by C-G formula based on SCr of 0.4 mg/dL (L)). Recent Labs  Lab 05/01/20 1140 05/01/20 1943 05/02/20 0603 05/03/20 0455 05/04/20 0608 05/04/20 0808 05/04/20 0809  PROCALCITON  --   --   --   --  0.82  --   --   WBC 29.1*   < > 21.3* 15.3* 16.1* 15.9*  --   LATICACIDVEN 1.1  --   --   --   --   --  1.1   < > = values in this interval not displayed.    Liver Function Tests: Recent Labs  Lab 05/01/20 1140 05/02/20 0603 05/03/20 0455 05/04/20 0608  AST 14* 17 19  --   ALT 10 10 12   --   ALKPHOS 129* 121 109  --   BILITOT 0.6 0.7 0.7  --   PROT 6.5 6.2* 6.1*  --   ALBUMIN 2.1* 1.9* 1.8* 2.0*   No results for input(s): LIPASE, AMYLASE in the last 168 hours. No results for input(s): AMMONIA in the last 168 hours.  ABG    Component Value Date/Time   PHART 7.470 (H) 01/10/2018 1346    PCO2ART 35.1 01/10/2018 1346   PO2ART 72.0 (L) 01/10/2018 1346   HCO3 25.5 01/10/2018 1346   TCO2 27 01/10/2018 1346   O2SAT 95.0 01/10/2018 1346     Coagulation Profile: Recent Labs  Lab 05/01/20 1140 05/02/20 0603 05/04/20 0808  INR 1.1 1.2 1.2    Cardiac Enzymes: No results for input(s): CKTOTAL, CKMB, CKMBINDEX, TROPONINI in the last 168 hours.  HbA1C: Hgb A1c MFr Bld  Date/Time Value Ref Range Status  10/19/2012 01:13 AM 6.1 (H) <5.7 % Final    Comment:    (NOTE)                                                                       According to the ADA Clinical Practice Recommendations for 2011, when HbA1c is used as a screening test:  >=6.5%   Diagnostic of Diabetes Mellitus           (if abnormal result is confirmed) 5.7-6.4%   Increased risk of developing Diabetes Mellitus References:Diagnosis and Classification of Diabetes Mellitus,Diabetes Care,2011,34(Suppl 1):S62-S69 and Standards of Medical Care in         Diabetes - 2011,Diabetes Care,2011,34 (Suppl 1):S11-S61.    CBG: No results for input(s): GLUCAP in the last 168 hours.     Past Medical History:  She,  has a past medical history of Anxiety, Arthritis, COPD (chronic obstructive pulmonary disease) (HCC), Coronary artery disease, GERD (gastroesophageal reflux disease), Headache(784.0), Hearing loss, central, Hematoma, Hypertension, Memory changes, Mild aortic stenosis, Mild carotid artery disease (HCC), and Seizures (HCC) (10/2012).   Surgical History:   Past Surgical History:  Procedure Laterality Date  . ABDOMINAL HYSTERECTOMY    . APPENDECTOMY    . CARDIAC CATHETERIZATION     15 yrs ago  . CORONARY/GRAFT ACUTE MI REVASCULARIZATION N/A 01/10/2018   Procedure: Coronary/Graft Acute MI Revascularization;  Surgeon: Runell Gess, MD;  Location: [facility] INVASIVE CV LAB;  Service: Cardiovascular;  Laterality: N/A;  . CRANIOTOMY Left 10/30/2012   Procedure: CRANIOTOMY HEMATOMA EVACUATION SUBDURAL;   Surgeon: Maeola Harman, MD;  Location: [facility] NEURO ORS;  Service: Neurosurgery;  Laterality: Left;  Left Craniotomy for evacuation of subdural hematoma  . JOINT REPLACEMENT Bilateral    knees  . LEFT HEART CATH AND CORONARY ANGIOGRAPHY N/A 01/10/2018   Procedure: LEFT HEART CATH AND CORONARY ANGIOGRAPHY;  Surgeon: Runell Gess, MD;  Location: [facility] INVASIVE CV LAB;  Service: Cardiovascular;  Laterality: N/A;     Social History:   reports that she has never smoked. She has never used smokeless tobacco. She reports that she does not drink alcohol and does not use drugs.   Family History:  Her family history includes Cancer in her father. There is no history of Ataxia, Chorea, Dementia, Mental retardation, Migraines, Multiple sclerosis, Neurofibromatosis, Neuropathy, Parkinsonism, Seizures, or Stroke.   Allergies Allergies  Allergen Reactions  . Lisinopril Swelling  . Eggs Or Egg-Derived Products Itching    Patient reports that she can eat eggs but does not tolerate egg derived products  . Morphine And Related     Headache  . Penicillins Itching    DID THE REACTION INVOLVE: Swelling of the face/tongue/throat, SOB, or low BP? No Sudden or severe rash/hives, skin peeling, or the inside of the mouth or nose? No Did it require medical treatment? No When did it last happen? Unknown If all above answers are "NO", may proceed with cephalosporin use.      Home Medications  Prior to Admission medications   Medication Sig Start Date End Date Taking? Authorizing Provider  acetaminophen (TYLENOL) 500 MG tablet Take 1 tablet (500 mg total) by mouth as needed (no more than 3 gm / day). 12/04/19  Yes Cleaver, Thomasene Ripple, NP  ALPRAZolam Prudy Feeler) 0.5 MG tablet Take 0.5 mg by mouth 3 (three) times daily as needed for anxiety.   Yes [provider]  aspirin 81 MG chewable tablet Chew 1 tablet (81 mg total) by mouth daily. 01/13/18  Yes Georgie Chard D, NP  atorvastatin (LIPITOR) 80 MG tablet Take 1  tablet (80 mg total) by mouth daily at 6 PM. 01/12/18  Yes Georgie Chard D, NP  carvedilol (COREG) 6.25 MG tablet TAKE ONE TABLET (6.25MG  TOTAL) BY MOUTH TWO TIMES DAILY 04/10/20  Yes Jonelle Sidle, MD  celecoxib (CELEBREX) 200 MG capsule Take 200 mg by mouth daily.   Yes [provider]  cholecalciferol (VITAMIN D) 1000 UNITS tablet Take 1,000 Units by mouth daily.    Yes [provider]  fluticasone (FLONASE) 50 MCG/ACT nasal spray Place 1 spray into both nostrils daily.  09/18/12  Yes [provider]  HYDROcodone-acetaminophen (NORCO) 10-325 MG tablet Take 1 tablet by mouth every 6 (six) hours as needed.   Yes [provider]  latanoprost (XALATAN) 0.005 % ophthalmic solution INSTILL ONE DROP IN BOTH EYES NIGHTLY 07/09/19  Yes Rankin, Alford Highland, MD  levETIRAcetam (KEPPRA) 500 MG tablet Take 500 mg by mouth 2 (two) times daily.   Yes [provider]  methocarbamol (ROBAXIN) 500 MG tablet Take 1 tablet by mouth in the morning and at bedtime.  02/22/19  Yes [provider]  nitroGLYCERIN (NITROSTAT) 0.4 MG SL tablet Place 1 tablet (0.4 mg total) under the tongue every 5 (five) minutes x 3 doses as needed for chest pain. 01/12/18  Yes Georgie Chard D, NP  pantoprazole (PROTONIX) 40 MG tablet Take 40 mg by mouth daily.   Yes [provider]  potassium chloride (K-DUR) 10 MEQ tablet Take 10 mEq by mouth daily.    Yes [provider]  Propylene Glycol (SYSTANE BALANCE OP) Place 1-2 drops into both eyes daily as needed (dry eyes).   Yes [provider]  risperiDONE (RISPERDAL) 0.5 MG tablet Take 0.5 mg by mouth 2 (two) times daily. 04/23/20  Yes [provider]  amLODipine (NORVASC) 5 MG tablet Take 1 tablet (5 mg total) by mouth daily. 05/29/19 12/04/19  Jonelle Sidle, MD  lisinopril (ZESTRIL) 20 MG tablet Take 1 tablet (20 mg total) by mouth 2 (two) times daily. 01/24/19 05/03/19  Ellsworth Lennox, PA-C         Sandrea Hughs, MD Pulmonary and Critical Care Medicine [facility] Cell [phone]   After 7:00 pm call Elink  [phone]

## 2020-05-04 NOTE — Progress Notes (Signed)
Subjective: Resting in bed. Daughter at bedside. Denies abdominal pain. Still with looser stool but 2 documented in past 24 hours. No overt GI bleeding. Febrile and tachycardic this morning.   Objective: Vital signs in last 24 hours: Temp:  [98.6 F (37 C)-101.7 F (38.7 C)] 101.7 F (38.7 C) (05/02 0700) Pulse Rate:  [99-116] 116 (05/02 0700) Resp:  [18-20] 20 (05/02 0700) BP: (151-187)/(87-92) 151/90 (05/02 0700) SpO2:  [90 %-100 %] 95 % (05/02 0700) Last BM Date: 06/01/20 General:   Acutely ill-appearing, pale, frail, resting with eyes closed Abdomen:  Bowel sounds present, soft, non-tender, non-distended. No HSM or hernias noted. No rebound or guarding. No masses appreciated  Msk:  Symmetrical without gross deformities. Normal posture. Neurologic:  Oriented to person. Resting and limited engagement.   Intake/Output from previous day: 05/01 0701 - 05/02 0700 In: 2261.6 [P.O.:75; I.V.:1536.6; IV Piggyback:650] Out: 2500 [Urine:2500] Intake/Output this shift: No intake/output data recorded.  Lab Results: Recent Labs    05/02/20 0603 05/03/20 0455 05/04/20 0608  WBC 21.3* 15.3* 16.1*  HGB 7.4* 7.3* 8.0*  HCT 24.6* 23.7* 25.6*  PLT 555* 548* 608*   BMET Recent Labs    05/02/20 0603 05/03/20 0455 05/04/20 0608  NA 133* 132* 131*  K 4.2 3.4* 2.7*  CL 100 99 93*  CO2 26 25 27   GLUCOSE 96 100* 111*  BUN 21 12 7*  CREATININE 0.49 0.42* 0.40*  CALCIUM 8.5* 8.2* 8.3*   LFT Recent Labs    05/01/20 1140 05/02/20 0603 05/03/20 0455 05/04/20 0608  PROT 6.5 6.2* 6.1*  --   ALBUMIN 2.1* 1.9* 1.8* 2.0*  AST 14* 17 19  --   ALT 10 10 12   --   ALKPHOS 129* 121 109  --   BILITOT 0.6 0.7 0.7  --    PT/INR Recent Labs    05/01/20 1140 05/02/20 0603  LABPROT 14.4 14.8  INR 1.1 1.2     Studies/Results: CT CHEST ABDOMEN PELVIS W CONTRAST  Result Date: 05/02/2020 CLINICAL DATA:  Sepsis.  Community-acquired pneumonia. EXAM: CT CHEST, ABDOMEN, AND PELVIS  WITH CONTRAST TECHNIQUE: Multidetector CT imaging of the chest, abdomen and pelvis was performed following the standard protocol during bolus administration of intravenous contrast. CONTRAST:  05/04/20 OMNIPAQUE IOHEXOL 300 MG/ML  SOLN COMPARISON:  No comparison studies available. FINDINGS: CT CHEST FINDINGS Cardiovascular: The heart size is normal. No substantial pericardial effusion. Coronary artery calcification is evident. Mitral annular calcification noted. Atherosclerotic calcification is noted in the wall of the thoracic aorta. Mediastinum/Nodes: Mediastinal lymphadenopathy evident. 15 mm short axis precarinal node shows central necrosis. 2.3 cm short axis subcarinal lymph node evident. No left hilar lymphadenopathy. Soft tissue fullness noted in the right hilum. Moderate to large hiatal hernia. There is no axillary lymphadenopathy. Lungs/Pleura: Dense consolidative opacity noted in the right lower lobe with 3 cm central low-density lesion in the lung parenchyma. This is associated with mild left base collapse/consolidation, moderate right pleural effusion and small left pleural effusion. Patchy areas of peripheral airspace disease are noted in the left upper lobe. Musculoskeletal: Bones are diffusely demineralized age-indeterminate nondisplaced fractures of the anterior right third and fourth ribs noted. Degenerative changes are evident in both shoulders, right greater than left. CT ABDOMEN PELVIS FINDINGS Hepatobiliary: No suspicious focal abnormality within the liver parenchyma. Gallbladder is distended. No intrahepatic or extrahepatic biliary dilation. Pancreas: No focal mass lesion. No dilatation of the main duct. No intraparenchymal cyst. No peripancreatic edema. Spleen: No splenomegaly. No focal  mass lesion. Adrenals/Urinary Tract: No adrenal nodule or mass. Kidneys unremarkable. No evidence for hydroureter. The urinary bladder appears normal for the degree of distention. Stomach/Bowel: Moderate to large  hiatal hernia Duodenum is normally positioned as is the ligament of Treitz. No small bowel wall thickening. No small bowel dilatation. The terminal ileum is normal. The appendix is not well visualized, but there is no edema or inflammation in the region of the cecum. Mild wall thickening noted in the sigmoid colon, potentially related to underdistention although infectious/inflammatory colitis not excluded. Vascular/Lymphatic: There is abdominal aortic atherosclerosis without aneurysm. There is no gastrohepatic or hepatoduodenal ligament lymphadenopathy. No retroperitoneal or mesenteric lymphadenopathy. No pelvic sidewall lymphadenopathy. Reproductive: Uterus surgically absent.  There is no adnexal mass. Other: Trace free fluid seen in the pelvis and in the left paracolic gutter. Musculoskeletal: No worrisome lytic or sclerotic osseous abnormality. Advanced degenerative disc disease noted lumbar spine. IMPRESSION: 1. Dense consolidative opacity in the right lower lobe with 3 cm central low-density lesion in the lung parenchyma. This lesion may be related to necrotic pneumonia/pulmonary abscess although neoplasm not excluded. 2. Mediastinal and right hilar lymphadenopathy is. Subcarinal lymph node is somewhat more bulky than typically seen for reactive etiology and metastatic disease is a concern. 3. Patchy areas of peripheral airspace disease in the left upper lobe, likely infectious/inflammatory. 4. Moderate to large hiatal hernia. 5. Mild wall thickening in the sigmoid colon, potentially related to underdistention although infectious/inflammatory colitis not excluded. 6. Trace free fluid in the pelvis and left paracolic gutter. 7. Aortic Atherosclerosis (ICD10-I70.0). Electronically Signed   By: Kennith Center M.D.   On: 05/02/2020 14:21    Assessment: 87 year old female admitted with community-acquired pneumonia and CT chest showing underlying lung abscess vs mass, UTI, diarrhea for past 2 weeks, anemia with  heme positive stool but without overt GI bleeding, now on sepsis protocol.   Diarrhea: seems to be slowing down. Thus far, Cdiff quick screen negative. No GI pathogen panel completed. Diarrhea likely multifactorial in setting of acute illness. Mild wall thickening in sigmoid colon noted and unable to rule out infectious etiology but also could be due to underdistension. 2 stools documented in past 24 hours. If persistent, check GI pathogen panel.  Anemia and heme positive stools: no overt GI bleeding. Not a candidate for endoscopic evaluation at this point. Recommend colonoscopy/EGD when clinically improved and may need to be as outpatient depending on clinical course.   Plan: PPI daily Follow H/H GI pathogen panel if further diarrhea Will continue to follow with you Not a candidate for endoscopic evaluation currently   Gelene Mink, PhD, ANP-BC [facility]    LOS: 2 days    05/04/2020, 8:24 AM

## 2020-05-04 NOTE — Progress Notes (Signed)
   05/04/20 0700  Assess: MEWS Score  Temp (!) 101.7 F (38.7 C)  BP (!) 151/90  Pulse Rate (!) 116  Resp 20  Level of Consciousness Alert  SpO2 95 %  O2 Device Room Air  Assess: MEWS Score  MEWS Temp 2  MEWS Systolic 0  MEWS Pulse 2  MEWS RR 0  MEWS LOC 0  MEWS Score 4  MEWS Score Color Red  Assess: if the MEWS score is Yellow or Red  Were vital signs taken at a resting state? Yes  Focused Assessment No change from prior assessment  Early Detection of Sepsis Score *See Row Information* High  MEWS guidelines implemented *See Row Information* Yes  Treat  MEWS Interventions Escalated (See documentation below)  Pain Scale 0-10  Pain Score 0  Take Vital Signs  Increase Vital Sign Frequency  Red: Q 1hr X 4 then Q 4hr X 4, if remains red, continue Q 4hrs  Escalate  MEWS: Escalate Red: discuss with charge nurse/RN and provider, consider discussing with RRT  Notify: Charge Nurse/RN  Name of Charge Nurse/RN Notified Sharol Roussel RN  Date Charge Nurse/RN Notified 05/04/20  Time Charge Nurse/RN Notified 0730  Notify: Provider  Provider Name/Title Nevin Bloodgood  Date Provider Notified 05/04/20  Time Provider Notified 0730  Notification Type Face-to-face  Notification Reason Other (Comment) (MEW 4)  Provider response At bedside  Date of Provider Response 05/04/20  Time of Provider Response 0730  Notify: Rapid Response  Name of Rapid Response RN Notified Juliette Alcide RN

## 2020-05-04 NOTE — TOC Initial Note (Signed)
Transition of Care [facility]) - Initial/Assessment Note    Patient Details  Name: Beverly [name] MRN: [id number] Date of Birth: December 09, 1932  Transition of Care [facility] CM/SW Contact:    Leitha Bleak, RN Phone Number: 05/04/2020, 3:02 PM  Clinical Narrative:   Patient admitted with pneumonia. PT recommending SNF. TOC spoke with her daughter.  She wants patient reevaluated closer to discharge. She is weak and now code sepsis. Family wishes to take patient home. They will have assistance, patient has a wheelchair and walker. Sarah with [facility] accepted the referral for Home health if patient goes home. TOC to follow.                 Expected Discharge Plan: Home w Home Health Services Barriers to Discharge: Continued Medical Work up Patient Goals and CMS Choice Patient states their goals for this hospitalization and ongoing recovery are:: to go home. CMS Medicare.gov Compare Post Acute Care list provided to:: Patient Represenative (must comment) Choice offered to / list presented to : Adult Children  Expected Discharge Plan and Services Expected Discharge Plan: Home w Home Health Services      Living arrangements for the past 2 months: Single Family Home                   HH Arranged: RN,PT   Date HH Agency Contacted: 05/04/20 Time HH Agency Contacted: 1201 Representative spoke with at [facility] Agency: Sarah  Prior Living Arrangements/Services Living arrangements for the past 2 months: Single Family Home Lives with:: Spouse Patient language and need for interpreter reviewed:: Yes Do you feel safe going back to the place where you live?: Yes      Need for Family Participation in Patient Care: Yes (Comment) Care giver support system in place?: Yes (comment) Current home services: DME Criminal Activity/Legal Involvement Pertinent to Current Situation/Hospitalization: No - Comment as needed  Activities of Daily Living Home Assistive Devices/Equipment: Hearing aid,Walker (specify  type),Bedside commode/3-in-1 ADL Screening (condition at time of admission) Patient's cognitive ability adequate to safely complete daily activities?: Yes Is the patient deaf or have difficulty hearing?: Yes Does the patient have difficulty seeing, even when wearing glasses/contacts?: No Does the patient have difficulty concentrating, remembering, or making decisions?: No Patient able to express need for assistance with ADLs?: Yes Does the patient have difficulty dressing or bathing?: Yes Independently performs ADLs?: No Communication: Independent Dressing (OT): Needs assistance Is this a change from baseline?: Change from baseline, expected to last <3days Grooming: Needs assistance Is this a change from baseline?: Change from baseline, expected to last <3 days Feeding: Needs assistance Is this a change from baseline?: Change from baseline, expected to last <3 days Bathing: Needs assistance Is this a change from baseline?: Change from baseline, expected to last <3 days Toileting: Needs assistance Is this a change from baseline?: Change from baseline, expected to last <3 days In/Out Bed: Needs assistance Is this a change from baseline?: Change from baseline, expected to last <3 days Walks in Home: Needs assistance Is this a change from baseline?: Change from baseline, expected to last <3 days Does the patient have difficulty walking or climbing stairs?: Yes Weakness of Legs: Both Weakness of Arms/Hands: Both  Permission Sought/Granted     Emotional Assessment      Alcohol / Substance Use: Not Applicable Psych Involvement: No (comment)  Admission diagnosis:  Lower urinary tract infectious disease [N39.0] Fall [W19.XXXA] Heme positive stool [R19.5] CAP (community acquired pneumonia) [J18.9] SIRS (systemic inflammatory response syndrome) (  HCC) [R65.10] Anemia, unspecified type [D64.9] Community acquired pneumonia of right lung, unspecified part of lung [J18.9] Pneumonia  [J18.9] Patient Active Problem List   Diagnosis Date Noted  . Sepsis (HCC) 05/04/2020  . Pneumonia 05/02/2020  . CAP (community acquired pneumonia) 05/01/2020  . Anemia 05/01/2020  . Heme positive stool 05/01/2020  . 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 Exudative age-related macular degeneration of left eye with active choroidal neovascularization (HCC) 05/13/2019  . 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 neovascularization (HCC) 05/13/2019  . Primary open angle glaucoma of both eyes, mild stage 05/13/2019  . Age-related nuclear cataract of left eye 05/13/2019  . Hypokalemia 01/12/2018  . Hypomagnesemia 01/12/2018  . COPD (chronic obstructive pulmonary disease) (HCC) 01/12/2018  . Acute ST elevation myocardial infarction (STEMI) of inferior wall (HCC) 01/10/2018  . Cardiac arrest (HCC)   . Dyslipidemia (high LDL; low HDL)   . 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 History of traumatic subdural hematoma 06/23/2014  . Subdural hematoma (HCC) 10/18/2012  . TIA (transient ischemic attack) 10/18/2012  . HTN (hypertension) 10/18/2012  . Hyponatremia 10/18/2012   PCP:  Benita Stabile, MD Pharmacy:   [facility] [place], [place] - [address] [place] [address] Phone: [phone] Fax: [phone]    Readmission Risk Interventions Readmission Risk Prevention Plan 05/04/2020  Transportation Screening Complete  Home Care Screening Complete  Medication Review (RN CM) Complete  Some recent data might be hidden

## 2020-05-04 NOTE — Progress Notes (Signed)
Physical Therapy Treatment Patient Details Name: Beverly [name] MRN: [id number] DOB: [birth year] Today's Date: 05/04/2020    History of Present Illness Beverly [name] is a 87 y.o. female with medical history significant of seizure disorder, hypertension, hyperlipidemia, dementia, is brought to the hospital for generalized weakness.  Patient's granddaughter is at bedside and provides history.  Patient has been having intermittent fevers for the past 2 weeks.  She had associated cough.  They have not noticed any significant shortness of breath.  She has had decreased p.o. intake and progressive generalized weakness.  She normally ambulates with a walker.  Due to her progressive weakness, they have had difficulty getting her out of bed.  She had gone to her primary care physician today who gave her some IV fluids.  Unfortunately, her symptoms not improved.  Today she was noted to be less responsive.  There has not been any melena, hematochezia.  No vomiting.  No abdominal pain.  Family does note that she is normally constipated, but has had diarrhea over the past 2 weeks.    PT Comments    Patient presents supine in bed with daughter at bedside. Patients daughter reports patient is currently on restriction and is not to transfer from bed until her IVs are removed. Patient stating that she has not been mobile for several days and feels very fatigued. Encouraged patient to transfer to seated position w/o success. Educated patient and daughter on LE strengthening and mobility exercise while in bed to facilitate return to standing activity. Performed supine LE strengthening exercise with education as to purpose of each. Informed patient and daughter that therapy will follow up in coming days when patient is feeling better to attempt ambulation using RW, which patient reports is baseline mobility. Patient left in bed with daughter present. Patient will benefit from continued physical therapy in hospital and  recommended venue below to increase strength, balance, endurance for safe ADLs and gait.     Follow Up Recommendations  SNF;Supervision for mobility/OOB;Supervision/Assistance - 24 hour     Equipment Recommendations  None recommended by PT    Recommendations for Other Services OT consult     Precautions / Restrictions      Mobility  Bed Mobility Overal bed mobility: Needs Assistance               Patient Response: Cooperative  Transfers                    Ambulation/Gait                 Stairs             Wheelchair Mobility    Modified Rankin (Stroke Patients Only)       Balance                                            Cognition Arousal/Alertness: Awake/alert Behavior During Therapy: WFL for tasks assessed/performed Overall Cognitive Status: Within Functional Limits for tasks assessed                                        Exercises General Exercises - Lower Extremity Ankle Circles/Pumps: 10 reps;Both;Supine Quad Sets: Strengthening;10 reps;Both;Supine Heel Slides: 10 reps;Strengthening;Supine    General Comments  Pertinent Vitals/Pain Pain Assessment: No/denies pain    Home Living Family/patient expects to be discharged to:: Private residence Living Arrangements: Children Available Help at Discharge: Family;Available 24 hours/day Type of Home: House Home Access: Stairs to enter Entrance Stairs-Rails: Right Home Layout: One level Home Equipment: Environmental consultant - 2 wheels Additional Comments: family members live in home with pt    Prior Function Level of Independence: Independent with assistive device(s)      Comments: household ambulator   PT Goals (current goals can now be found in the care plan section) Acute Rehab PT Goals Patient Stated Goal: Decrease level of assistance PT Goal Formulation: With patient Time For Goal Achievement: 05/09/20 Potential to Achieve Goals:  Good    Frequency    Min 3X/week      PT Plan      Co-evaluation              AM-PAC PT "6 Clicks" Mobility   Outcome Measure  Help needed turning from your back to your side while in a flat bed without using bedrails?: A Little Help needed moving from lying on your back to sitting on the side of a flat bed without using bedrails?: A Lot Help needed moving to and from a bed to a chair (including a wheelchair)?: A Little Help needed standing up from a chair using your arms (e.g., wheelchair or bedside chair)?: A Little Help needed to walk in hospital room?: A Lot Help needed climbing 3-5 steps with a railing? : A Lot 6 Click Score: 15    End of Session Equipment Utilized During Treatment: Gait belt Activity Tolerance: Patient limited by fatigue Patient left: in bed;with bed alarm set Nurse Communication: Mobility status PT Visit Diagnosis: Unsteadiness on feet (R26.81);Other abnormalities of gait and mobility (R26.89);Muscle weakness (generalized) (M62.81);Difficulty in walking, not elsewhere classified (R26.2)     Time: 0235-0250 PT Time Calculation (min) (ACUTE ONLY): 15 min  Charges:  $Therapeutic Exercise: 8-22 mins           6:36 PM, 05/04/20 Georges Lynch PT DPT  Physical Therapist with [facility]  [phone]

## 2020-05-04 NOTE — Sepsis Progress Note (Signed)
eLink is following this Code Sepsis. °

## 2020-05-04 NOTE — Plan of Care (Signed)
  Problem: Education: Goal: Knowledge of General Education information will improve Description: Including pain rating scale, medication(s)/side effects and non-pharmacologic comfort measures Outcome: Progressing   Problem: Health Behavior/Discharge Planning: Goal: Ability to manage health-related needs will improve Outcome: Progressing   Problem: Clinical Measurements: Goal: Ability to maintain clinical measurements within normal limits will improve Outcome: Progressing Goal: Will remain free from infection Outcome: Progressing Goal: Diagnostic test results will improve Outcome: Progressing Goal: Respiratory complications will improve Outcome: Progressing Goal: Cardiovascular complication will be avoided Outcome: Progressing   Problem: Activity: Goal: Risk for activity intolerance will decrease Outcome: Progressing   Problem: Nutrition: Goal: Adequate nutrition will be maintained Outcome: Progressing   Problem: Coping: Goal: Level of anxiety will decrease Outcome: Progressing   Problem: Elimination: Goal: Will not experience complications related to bowel motility Outcome: Progressing Goal: Will not experience complications related to urinary retention Outcome: Progressing   Problem: Pain Managment: Goal: General experience of comfort will improve Outcome: Progressing   Problem: Safety: Goal: Ability to remain free from injury will improve Outcome: Progressing   Problem: Skin Integrity: Goal: Risk for impaired skin integrity will decrease Outcome: Progressing   Problem: Activity: Goal: Ability to tolerate increased activity will improve Outcome: Progressing   Problem: Clinical Measurements: Goal: Ability to maintain a body temperature in the normal range will improve Outcome: Progressing   Problem: Respiratory: Goal: Ability to maintain adequate ventilation will improve Outcome: Progressing Goal: Ability to maintain a clear airway will improve Outcome:  Progressing   Problem: Urinary Elimination: Goal: Signs and symptoms of infection will decrease Outcome: Progressing   

## 2020-05-04 NOTE — Progress Notes (Addendum)
PROGRESS NOTE    Beverly [name]  XBJ:[id number] DOB: August 08, 1932 DOA: 05/01/2020 PCP: Benita Stabile, MD    Brief Narrative:  87 year old female with a history of seizure disorder, hypertension, hyperlipidemia, mild dementia, admitted to the hospital with generalized weakness, fever and cough.  Found to have significant right lower lobe pneumonia.  CT indicates possible underlying lung abscess versus mass.  She is on IV antibiotics.  Also noted to have heme positive stools and anemia.  GI following.   Subjective: The patient was seen and examined this morning... Awake alert, mildly confused, lethargic Follow command.... Daughter present at bedside  Morning temp 101.7, blood pressure 151/90, pulse 116, was satting 95% on room air, noted for leukocytosis 16.1, potassium 2.7,  Sepsis course was called, patient started on IV fluids, antibiotics recultured per sepsis protocol  Treatment parameter was discussed with the patient and her daughter at bedside she currently agrees with current pursuit of treatment Patient daughter was informed that she may get more shortness of breath due to aggressive IV fluid resuscitation per sepsis protocol..  Stressed understanding and agreement with the plan.  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 -Febrile this a.m. 101.7, worsening leukocytosis to 16.1, -Currently on IV antibiotics >>> antibiotic has been broadened..  To cefepime -CT chest shows underlying lung abscess versus mass -We will need pulmonology input on Monday -Continue current antibiotic coverage with cefepime, azithromycin, Flagyl -Sputum culture ordered, but has not been sent as of yet -Strep pneumonia antigen negative, Legionella antigen still in process  Diarrhea -Noted to have significant diarrhea over the past 2 weeks. -Improved -CT abdomen does not indicate any clear-cut colitis -Stool for C. difficile is negative -Use Imodium as needed -Initiating lactobacillus  Urinary tract infection -Urine culture positive for E. Coli -This is being covered by current antibiotics   Iron deficiency anemia -Unclear baseline hemoglobin -She was noted to have heme positive stools -Per patient, she does not have any melena or hematochezia -Continue on PPI -Anemia panel indicates iron deficiency -Seen by GI, appreciate input -She will need EGD/colonoscopy once medically stable -We will consider IV  iron infusion once infectious process has stabilized -Overall hemoglobin appears to be stable -Transfuse for hemoglobin less than 7  Seizure disorder -Continue on Keppra -Stable  Hyperlipidemia -Continue on statin  Hypertension -Blood pressure is trending up -Resume Coreg and amlodipine  Anxiety -Continue on home dose of Xanax -Stable  Generalized weakness -Seen by physical therapy with recommendations for skilled nursing facility placement -   DVT prophylaxis: SCDs Start: 05/01/20 1907  Code Status: DNR Family Communication: Discussed with son at the bedside Disposition Plan: Status is: Inpatient  The patient will require care spanning > 2 midnights and should be moved to inpatient because: IV treatments appropriate due to intensity of illness or inability to take PO  Dispo: The patient  is from:  Home              Anticipated d/c is to: TBD, may need placement--PT recommending SNF              Patient currently is not medically stable to d/c.   Difficult to place patient No         Consultants:   GI  Procedures:   None    Urine culture from 05/01/2020  >> > 100 K E. coli colonies  blood cultures from 05/01/2020  >> growing staph epididymis likely contaminant  5-22 blood cultures x2  >>>  antimicrobials:   Ceftriaxone 4/29 > 4/30  Cefepime 4/30 >  Azithromycin 4/29 >  Flagyl 4/30 >   Objective: Vitals:   05/04/20 0411 05/04/20 0700 05/04/20 0840 05/04/20 0955  BP: (!) 187/92 (!) 151/90 (!) 141/82 130/77  Pulse: (!) 101 (!) 116 (!) 113 88  Resp: Temp: 98.6 F (37 C) (!) 101.7 F (38.7 C) 98.8 F (37.1 C) 98.8 F (37.1 C)  TempSrc:  Axillary Oral Oral  SpO2: 100% 95% 96% 97%  Weight:      Height:        Intake/Output Summary (Last 24 hours) at 05/04/2020 1359 Last data filed at 05/04/2020 1300 Gross per 24 hour  Intake 3221.61 ml  Output 1850 ml  Net 1371.61 ml   Filed Weights   05/01/20 1127 05/01/20 1751  Weight: 72.6 kg 57.1 kg      Physical Exam:   General:   Cachectic elderly female-awake, alert, mild lethargic  HEENT:  Normocephalic, PERRL, otherwise with in Normal limits   Neuro:  CNII-XII intact. , normal motor and sensation, reflexes intact   Lungs:   Clear to auscultation BL, Respirations unlabored, no wheezes / crackles  Cardio:    S1/S2, RRR, No murmure, No Rubs or Gallops   Abdomen:   Soft, non-tender, bowel sounds active all four quadrants,  no guarding or peritoneal signs.  Muscular skeletal:   Generalized weaknesses Limited exam - in bed, able to move all 4 extremities, Normal strength,  2+ pulses,  symmetric, No pitting edema  Skin:  Dry, warm to touch, negative for any Rashes,  Wounds: Please see nursing documentation         Data Reviewed: I have personally reviewed following labs and imaging  studies  CBC: Recent Labs  Lab 05/01/20 1140 05/01/20 1943 05/02/20 0603 05/03/20 0455 05/04/20 0608 05/04/20 0808  WBC 29.1* 23.0* 21.3* 15.3* 16.1* 15.9*  NEUTROABS 25.1* 19.5*  --   --   --  13.1*  HGB 7.8* 7.0* 7.4* 7.3* 8.0* 8.1*  HCT 26.0* 22.8* 24.6* 23.7* 25.6* 25.3*  MCV 80.5 79.4* 79.4* 77.7* 76.0* 76.2*  PLT 522* 505* 555* 548* 608* 597*   Basic Metabolic Panel: Recent Labs  Lab 05/01/20 1140 05/02/20 0603 05/03/20 0455 05/04/20 0608  NA 131* 133* 132* 131*  K 4.8 4.2 3.4* 2.7*  CL 98 100 99 93*  CO2 GLUCOSE 109* 96 100* 111*  BUN 31* 21 12 7*  CREATININE 0.63 0.49 0.42* 0.40*  CALCIUM 8.5* 8.5* 8.2* 8.3*  MG  --   --   --  1.1*  PHOS  --   --   --  2.1*   GFR: Estimated Creatinine Clearance: 38.5 mL/min (A) (by C-G formula based on SCr of 0.4 mg/dL (L)). Liver Function Tests: Recent Labs  Lab 05/01/20 1140 05/02/20 0603 05/03/20 0455 05/04/20  5885  AST 14* 17 19  --   ALT 10 10 12   --   ALKPHOS 129* 121 109  --   BILITOT 0.6 0.7 0.7  --   PROT 6.5 6.2* 6.1*  --   ALBUMIN 2.1* 1.9* 1.8* 2.0*   No results for input(s): LIPASE, AMYLASE in the last 168 hours. No results for input(s): AMMONIA in the last 168 hours. Coagulation Profile: Recent Labs  Lab 05/01/20 1140 05/02/20 0603 05/04/20 0808  INR 1.1 1.2 1.2   Cardiac Enzymes: No results for input(s): CKTOTAL, CKMB, CKMBINDEX, TROPONINI in the last 168 hours. BNP (last 3 results) No results for input(s): PROBNP in the last 8760 hours. HbA1C: No results for input(s): HGBA1C in the last 72 hours. CBG: No results for input(s): GLUCAP in the last 168 hours. Lipid Profile: No results for input(s): CHOL, HDL, LDLCALC, TRIG, CHOLHDL, LDLDIRECT in the last 72 hours. Thyroid Function Tests: No results for input(s): TSH, T4TOTAL, FREET4, T3FREE, THYROIDAB in the last 72 hours. Anemia Panel: Recent Labs    05/01/20 1943  VITAMINB12 1,840*  FOLATE 13.7  FERRITIN 70  TIBC  UNABLE TO CALCULATE DUE TO LINEARITY  IRON <5*  RETICCTPCT 1.1   Sepsis Labs: Recent Labs  Lab 05/01/20 1140 05/04/20 0608 05/04/20 0809  PROCALCITON  --  0.82  --   LATICACIDVEN 1.1  --  1.1    Recent Results (from the past 240 hour(s))  Blood Culture (routine x 2)     Status: None (Preliminary result)   Collection Time: 05/01/20 11:41 AM   Specimen: BLOOD  Result Value Ref Range Status   Specimen Description BLOOD RIGHT ARM  Final   Special Requests   Final    BOTTLES DRAWN AEROBIC AND ANAEROBIC Blood Culture adequate volume   Culture   Final    NO GROWTH 3 DAYS Performed at [facility], [address] [place], [place]    Report Status PENDING  Incomplete  Blood Culture (routine x 2)     Status: Abnormal (Preliminary result)   Collection Time: 05/01/20 11:42 AM   Specimen: BLOOD  Result Value Ref Range Status   Specimen Description   Final    BLOOD LEFT ARM Performed at [facility], [address] [place], [place]    Special Requests   Final    BOTTLES DRAWN AEROBIC AND ANAEROBIC Blood Culture adequate volume Performed at [facility], [address] [place], [place]    Culture  Setup Time   Final    GRAM POSITIVE COCCI ANAEROBIC BOTTLE Gram Stain Report Called to,Read Back By and Verified With: HOWARDSON,M@1416  BY MATTHEWS, B 5.1.22 Organism ID to follow    Culture (A)  Final    STAPHYLOCOCCUS EPIDERMIDIS THE SIGNIFICANCE OF ISOLATING THIS ORGANISM FROM A SINGLE SET OF BLOOD CULTURES WHEN MULTIPLE SETS ARE DRAWN IS UNCERTAIN. PLEASE NOTIFY THE MICROBIOLOGY DEPARTMENT WITHIN ONE WEEK IF SPECIATION AND SENSITIVITIES ARE REQUIRED. Performed at [facility], [address], [place], [place]    Report Status PENDING  Incomplete  Blood Culture ID Panel (Reflexed)     Status: Abnormal   Collection Time: 05/01/20 11:42 AM  Result Value Ref Range Status   Enterococcus faecalis NOT DETECTED NOT DETECTED Final    Enterococcus Faecium NOT DETECTED NOT DETECTED Final   Listeria monocytogenes NOT DETECTED NOT DETECTED Final   Staphylococcus species DETECTED (A) NOT DETECTED Final    Comment: CRITICAL RESULT CALLED TO, READ BACK  BY AND VERIFIED WITH: NACKSON ONDARA,RN 05/03/2020 AT 2155 A.HUGHES    Staphylococcus aureus (BCID) NOT DETECTED NOT DETECTED Final   Staphylococcus epidermidis DETECTED (A) NOT DETECTED Final    Comment: Methicillin (oxacillin) resistant coagulase negative staphylococcus. Possible blood culture contaminant (unless isolated from more than one blood culture draw or clinical case suggests pathogenicity). No antibiotic treatment is indicated for blood  culture contaminants. CRITICAL RESULT CALLED TO, READ BACK BY AND VERIFIED WITH: NACKSON ONDARA,RN 05/03/2020 AT 2155 A.HUGHES    Staphylococcus lugdunensis NOT DETECTED NOT DETECTED Final   Streptococcus species NOT DETECTED NOT DETECTED Final   Streptococcus agalactiae NOT DETECTED NOT DETECTED Final   Streptococcus pneumoniae NOT DETECTED NOT DETECTED Final   Streptococcus pyogenes NOT DETECTED NOT DETECTED Final   A.calcoaceticus-baumannii NOT DETECTED NOT DETECTED Final   Bacteroides fragilis NOT DETECTED NOT DETECTED Final   Enterobacterales NOT DETECTED NOT DETECTED Final   Enterobacter cloacae complex NOT DETECTED NOT DETECTED Final   Escherichia coli NOT DETECTED NOT DETECTED Final   Klebsiella aerogenes NOT DETECTED NOT DETECTED Final   Klebsiella oxytoca NOT DETECTED NOT DETECTED Final   Klebsiella pneumoniae NOT DETECTED NOT DETECTED Final   Proteus species NOT DETECTED NOT DETECTED Final   Salmonella species NOT DETECTED NOT DETECTED Final   Serratia marcescens NOT DETECTED NOT DETECTED Final   Haemophilus influenzae NOT DETECTED NOT DETECTED Final   Neisseria meningitidis NOT DETECTED NOT DETECTED Final   Pseudomonas aeruginosa NOT DETECTED NOT DETECTED Final   Stenotrophomonas maltophilia NOT DETECTED NOT  DETECTED Final   Candida albicans NOT DETECTED NOT DETECTED Final   Candida auris NOT DETECTED NOT DETECTED Final   Candida glabrata NOT DETECTED NOT DETECTED Final   Candida krusei NOT DETECTED NOT DETECTED Final   Candida parapsilosis NOT DETECTED NOT DETECTED Final   Candida tropicalis NOT DETECTED NOT DETECTED Final   Cryptococcus neoformans/gattii NOT DETECTED NOT DETECTED Final   Methicillin resistance mecA/C DETECTED (A) NOT DETECTED Final    Comment: CRITICAL RESULT CALLED TO, READ BACK BY AND VERIFIED WITH: NACKSON ONDARA,RN 05/03/2020 AT 2155 A.HUGHES Performed at [facility], [address] [place], [place] [address]   Urine culture     Status: Abnormal   Collection Time: 05/01/20 12:15 PM   Specimen: Urine, Clean Catch  Result Value Ref Range Status   Specimen Description   Final    URINE, CLEAN CATCH Performed at [facility], [address] [place], [place] [address]    Special Requests   Final    NONE Performed at [facility], [address] [place], [place] [address]    Culture >=100,000 COLONIES/mL ESCHERICHIA COLI (A)  Final   Report Status 05/04/2020 FINAL  Final   Organism ID, Bacteria ESCHERICHIA COLI (A)  Final      Susceptibility   Escherichia coli - MIC*    AMPICILLIN >=32 RESISTANT Resistant     CEFAZOLIN <=4 SENSITIVE Sensitive     CEFEPIME <=0.12 SENSITIVE Sensitive     CEFTRIAXONE <=0.25 SENSITIVE Sensitive     CIPROFLOXACIN <=0.25 SENSITIVE Sensitive     GENTAMICIN >=16 RESISTANT Resistant     IMIPENEM <=0.25 SENSITIVE Sensitive     NITROFURANTOIN <=16 SENSITIVE Sensitive     TRIMETH/SULFA >=320 RESISTANT Resistant     AMPICILLIN/SULBACTAM >=32 RESISTANT Resistant     PIP/TAZO <=4 SENSITIVE Sensitive     * >=100,000 COLONIES/mL ESCHERICHIA COLI  Resp Panel by RT-PCR (Flu A&B, Covid) Nasopharyngeal Swab     Status: None  Collection Time: 05/01/20 12:15 PM   Specimen: Nasopharyngeal Swab; Nasopharyngeal(NP) swabs in vial transport  medium  Result Value Ref Range Status   SARS Coronavirus 2 by RT PCR NEGATIVE NEGATIVE Final    Comment: (NOTE) SARS-CoV-2 target nucleic acids are NOT DETECTED.  The SARS-CoV-2 RNA is generally detectable in upper respiratory specimens during the acute phase of infection. The lowest concentration of SARS-CoV-2 viral copies this assay can detect is 138 copies/mL. A negative result does not preclude SARS-Cov-2 infection and should not be used as the sole basis for treatment or other patient management decisions. A negative result may occur with  improper specimen collection/handling, submission of specimen other than nasopharyngeal swab, presence of viral mutation(s) within the areas targeted by this assay, and inadequate number of viral copies(<138 copies/mL). A negative result must be combined with clinical observations, patient history, and epidemiological information. The expected result is Negative.  Fact Sheet for Patients:  BloggerCourse.com  Fact Sheet for Healthcare Providers:  SeriousBroker.it  This test is no t yet approved or cleared by the [place] FDA and  has been authorized for detection and/or diagnosis of SARS-CoV-2 by FDA under an Emergency Use Authorization (EUA). This EUA will remain  in effect (meaning this test can be used) for the duration of the COVID-19 declaration under Section 564(b)(1) of the Act, 21 U.S.C.section 360bbb-3(b)(1), unless the authorization is terminated  or revoked sooner.       Influenza A by PCR NEGATIVE NEGATIVE Final   Influenza B by PCR NEGATIVE NEGATIVE Final    Comment: (NOTE) The Xpert Xpress SARS-CoV-2/FLU/RSV plus assay is intended as an aid in the diagnosis of influenza from Nasopharyngeal swab specimens and should not be used as a sole basis for treatment. Nasal washings and aspirates are unacceptable for Xpert Xpress SARS-CoV-2/FLU/RSV testing.  Fact Sheet for  Patients: BloggerCourse.com  Fact Sheet for Healthcare Providers: SeriousBroker.it  This test is not yet approved or cleared by the [place] FDA and has been authorized for detection and/or diagnosis of SARS-CoV-2 by FDA under an Emergency Use Authorization (EUA). This EUA will remain in effect (meaning this test can be used) for the duration of the COVID-19 declaration under Section 564(b)(1) of the Act, 21 U.S.C. section 360bbb-3(b)(1), unless the authorization is terminated or revoked.  Performed at [facility], [address] [place], [place] [address]   C Difficile Quick Screen w PCR reflex     Status: None   Collection Time: 05/02/20  3:39 PM   Specimen: STOOL  Result Value Ref Range Status   C Diff antigen NEGATIVE NEGATIVE Final   C Diff toxin NEGATIVE NEGATIVE Final   C Diff interpretation No C. difficile detected.  Final    Comment: Performed at [facility], [address] [place], [place] [address]  Culture, blood (x 2)     Status: None (Preliminary result)   Collection Time: 05/04/20  8:07 AM   Specimen: BLOOD  Result Value Ref Range Status   Specimen Description BLOOD LEFT ANTECUBITAL  Final   Special Requests   Final    BOTTLES DRAWN AEROBIC AND ANAEROBIC Blood Culture adequate volume   Culture   Final    NO GROWTH <12 HOURS Performed at [facility], [address] [place], [place] [address]    Report Status PENDING  Incomplete  Culture, blood (x 2)     Status: None (Preliminary result)   Collection Time: 05/04/20  8:09 AM   Specimen: BLOOD  Result Value Ref  Range Status   Specimen Description BLOOD BLOOD LEFT HAND  Final   Special Requests   Final    BOTTLES DRAWN AEROBIC AND ANAEROBIC Blood Culture adequate volume   Culture   Final    NO GROWTH <12 HOURS Performed at [facility], [address] [place], [place] [address]    Report Status PENDING  Incomplete         Radiology Studies: CT  CHEST ABDOMEN PELVIS W CONTRAST  Result Date: 05/02/2020 CLINICAL DATA:  Sepsis.  Community-acquired pneumonia. EXAM: CT CHEST, ABDOMEN, AND PELVIS WITH CONTRAST TECHNIQUE: Multidetector CT imaging of the chest, abdomen and pelvis was performed following the standard protocol during bolus administration of intravenous contrast. CONTRAST:  OMNIPAQUE IOHEXOL 300 MG/ML  SOLN COMPARISON:  No comparison studies available. FINDINGS: CT CHEST FINDINGS Cardiovascular: The heart size is normal. No substantial pericardial effusion. Coronary artery calcification is evident. Mitral annular calcification noted. Atherosclerotic calcification is noted in the wall of the thoracic aorta. Mediastinum/Nodes: Mediastinal lymphadenopathy evident. 15 mm short axis precarinal node shows central necrosis. 2.3 cm short axis subcarinal lymph node evident. No left hilar lymphadenopathy. Soft tissue fullness noted in the right hilum. Moderate to large hiatal hernia. There is no axillary lymphadenopathy. Lungs/Pleura: Dense consolidative opacity noted in the right lower lobe with 3 cm central low-density lesion in the lung parenchyma. This is associated with mild left base collapse/consolidation, moderate right pleural effusion and small left pleural effusion. Patchy areas of peripheral airspace disease are noted in the left upper lobe. Musculoskeletal: Bones are diffusely demineralized age-indeterminate nondisplaced fractures of the anterior right third and fourth ribs noted. Degenerative changes are evident in both shoulders, right greater than left. CT ABDOMEN PELVIS FINDINGS Hepatobiliary: No suspicious focal abnormality within the liver parenchyma. Gallbladder is distended. No intrahepatic or extrahepatic biliary dilation. Pancreas: No focal mass lesion. No dilatation of the main duct. No intraparenchymal cyst. No peripancreatic edema. Spleen: No splenomegaly. No focal mass lesion. Adrenals/Urinary Tract: No adrenal nodule or mass.  Kidneys unremarkable. No evidence for hydroureter. The urinary bladder appears normal for the degree of distention. Stomach/Bowel: Moderate to large hiatal hernia Duodenum is normally positioned as is the ligament of Treitz. No small bowel wall thickening. No small bowel dilatation. The terminal ileum is normal. The appendix is not well visualized, but there is no edema or inflammation in the region of the cecum. Mild wall thickening noted in the sigmoid colon, potentially related to underdistention although infectious/inflammatory colitis not excluded. Vascular/Lymphatic: There is abdominal aortic atherosclerosis without aneurysm. There is no gastrohepatic or hepatoduodenal ligament lymphadenopathy. No retroperitoneal or mesenteric lymphadenopathy. No pelvic sidewall lymphadenopathy. Reproductive: Uterus surgically absent.  There is no adnexal mass. Other: Trace free fluid seen in the pelvis and in the left paracolic gutter. Musculoskeletal: No worrisome lytic or sclerotic osseous abnormality. Advanced degenerative disc disease noted lumbar spine. IMPRESSION: 1. Dense consolidative opacity in the right lower lobe with 3 cm central low-density lesion in the lung parenchyma. This lesion may be related to necrotic pneumonia/pulmonary abscess although neoplasm not excluded. 2. Mediastinal and right hilar lymphadenopathy is. Subcarinal lymph node is somewhat more bulky than typically seen for reactive etiology and metastatic disease is a concern. 3. Patchy areas of peripheral airspace disease in the left upper lobe, likely infectious/inflammatory. 4. Moderate to large hiatal hernia. 5. Mild wall thickening in the sigmoid colon, potentially related to underdistention although infectious/inflammatory colitis not excluded. 6. Trace free fluid in the pelvis and left paracolic gutter. 7. Aortic  Atherosclerosis (ICD10-I70.0). Electronically Signed   By: Kennith Center M.D.   On: 05/02/2020 14:21        Scheduled  Meds: . amLODipine  10 mg Oral Daily  . carvedilol  6.25 mg Oral BID WC  . guaiFENesin  600 mg Oral BID  . lactobacillus  1 g Oral TID WC  . latanoprost  1 drop Both Eyes QHS  . levETIRAcetam  500 mg Oral BID  . pantoprazole (PROTONIX) IV  40 mg Intravenous Q12H  . potassium chloride  60 mEq Oral BID  . risperiDONE  0.5 mg Oral BID   Continuous Infusions: . azithromycin Stopped (05/03/20 2032)  . ceFEPime (MAXIPIME) IV 2 g (05/04/20 0814)  . lactated ringers     And  . lactated ringers    . metronidazole 500 mg (05/04/20 0410)     LOS: 2 days    Time spent:    Kendell Bane, MD [facility]   If 7PM-7AM, please contact night-coverage [email or web address]  05/04/2020, 1:59 PM

## 2020-05-05 ENCOUNTER — Inpatient Hospital Stay (HOSPITAL_COMMUNITY): Payer: Medicare Other

## 2020-05-05 ENCOUNTER — Other Ambulatory Visit (HOSPITAL_COMMUNITY): Payer: Medicare Other

## 2020-05-05 DIAGNOSIS — E785 Hyperlipidemia, unspecified: Secondary | ICD-10-CM | POA: Diagnosis not present

## 2020-05-05 DIAGNOSIS — J189 Pneumonia, unspecified organism: Secondary | ICD-10-CM | POA: Diagnosis not present

## 2020-05-05 DIAGNOSIS — D500 Iron deficiency anemia secondary to blood loss (chronic): Secondary | ICD-10-CM | POA: Diagnosis not present

## 2020-05-05 DIAGNOSIS — I10 Essential (primary) hypertension: Secondary | ICD-10-CM

## 2020-05-05 DIAGNOSIS — R195 Other fecal abnormalities: Secondary | ICD-10-CM

## 2020-05-05 DIAGNOSIS — A419 Sepsis, unspecified organism: Secondary | ICD-10-CM | POA: Diagnosis not present

## 2020-05-05 LAB — BASIC METABOLIC PANEL
Anion gap: 5 (ref 5–15)
BUN: 11 mg/dL (ref 8–23)
CO2: 29 mmol/L (ref 22–32)
Calcium: 7.9 mg/dL — ABNORMAL LOW (ref 8.9–10.3)
Chloride: 98 mmol/L (ref 98–111)
Creatinine, Ser: 0.41 mg/dL — ABNORMAL LOW (ref 0.44–1.00)
GFR, Estimated: >60 mL/min (ref >60)
Glucose, Bld: 103 mg/dL — ABNORMAL HIGH (ref 70–99)
Potassium: 4.1 mmol/L (ref 3.5–5.1)
Sodium: 132 mmol/L — ABNORMAL LOW (ref 135–145)

## 2020-05-05 LAB — CBC
HCT: 21.1 % — ABNORMAL LOW (ref 36.0–46.0)
Hemoglobin: 6.4 g/dL — CL (ref 12.0–15.0)
MCH: 23.4 pg — ABNORMAL LOW (ref 26.0–34.0)
MCHC: 30.3 g/dL (ref 30.0–36.0)
MCV: 77 fL — ABNORMAL LOW (ref 80.0–100.0)
Platelets: 510 10*3/uL — ABNORMAL HIGH (ref 150–400)
RBC: 2.74 MIL/uL — ABNORMAL LOW (ref 3.87–5.11)
RDW: 18.8 % — ABNORMAL HIGH (ref 11.5–15.5)
WBC: 16.4 10*3/uL — ABNORMAL HIGH (ref 4.0–10.5)
nRBC: 0 % (ref 0.0–0.2)

## 2020-05-05 LAB — HEMOGLOBIN AND HEMATOCRIT, BLOOD
HCT: 26 % — ABNORMAL LOW (ref 36.0–46.0)
HCT: 22.7 % — ABNORMAL LOW (ref 36.0–46.0)
HCT: 30.6 % — ABNORMAL LOW (ref 36.0–46.0)
Hemoglobin: 8 g/dL — ABNORMAL LOW (ref 12.0–15.0)
Hemoglobin: 7.1 g/dL — ABNORMAL LOW (ref 12.0–15.0)
Hemoglobin: 9.9 g/dL — ABNORMAL LOW (ref 12.0–15.0)

## 2020-05-05 LAB — PREPARE RBC (CROSSMATCH)

## 2020-05-05 LAB — OCCULT BLOOD X 1 CARD TO LAB, STOOL
Fecal Occult Bld: NEGATIVE
Fecal Occult Bld: NEGATIVE

## 2020-05-05 LAB — SEDIMENTATION RATE: Sed Rate: 91 mm/hr — ABNORMAL HIGH (ref 0–22)

## 2020-05-05 MED ORDER — ACETAMINOPHEN 325 MG PO TABS
650.0000 mg | ORAL_TABLET | Freq: Once | ORAL | Status: AC
  Administered 2020-05-05: 650 mg via ORAL
  Filled 2020-05-05: qty 2

## 2020-05-05 MED ORDER — DIPHENHYDRAMINE HCL 25 MG PO CAPS
25.0000 mg | ORAL_CAPSULE | Freq: Once | ORAL | Status: DC
  Filled 2020-05-05: qty 1

## 2020-05-05 MED ORDER — SODIUM CHLORIDE 0.9% IV SOLUTION: Freq: Once | INTRAVENOUS | Status: AC

## 2020-05-05 MED ORDER — FUROSEMIDE 10 MG/ML IJ SOLN
20.0000 mg | Freq: Once | INTRAMUSCULAR | Status: DC
  Filled 2020-05-05: qty 2

## 2020-05-05 MED ORDER — FUROSEMIDE 10 MG/ML IJ SOLN
20.0000 mg | Freq: Once | INTRAMUSCULAR | Status: AC
  Administered 2020-05-05: 20 mg via INTRAVENOUS
  Filled 2020-05-05: qty 2

## 2020-05-05 NOTE — Progress Notes (Addendum)
PROGRESS NOTE    Beverly [name]  CBS:[id number] DOB: Sep 22, 1932 DOA: 05/01/2020 PCP: Celene Squibb, MD    Brief Narrative:  87 year old female with a history of seizure disorder, hypertension, hyperlipidemia, mild dementia, admitted to the hospital with generalized weakness, fever and cough.  Found to have significant right lower lobe pneumonia.  CT indicates possible underlying lung abscess versus mass.  She is on IV antibiotics.  Also noted to have heme positive stools and anemia.  GI following.   Subjective: The patient was seen and examined this morning, stable in bed, in no acute distress hemodynamically stable awake alert comfortable.. Her vitals are stable this morning, satting 96% on 2 L.  But this morning's hemoglobin reported to be 6.4. The patient nursing staff reporting of no signs of bleeding..  She did consent for PRBC transfusion. 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 -"Developed sepsis criteria, with febrile, leukocytosis--- which improved today afebrile normotensive -Currently on IV antibiotics >>> antibiotic has been broadened..  To cefepime -CT chest shows underlying lung abscess versus mass -Pulmonology consulted appreciate  follow-up -Continue current antibiotic coverage with cefepime, azithromycin, Flagyl -Sputum culture ordered, but has not been sent as of yet -Strep pneumonia antigen negative, Legionella antigen still in process  Lung lesion -nodule versus versus abscess  -Findings on imaging -CT chest abdomen pelvis reviewed: Dense consolidative opacity in the right lower lobe with 3 cm central low-density lesion in the lung parenchyma. This lesion may be related to necrotic pneumonia/pulmonary abscess although neoplasm not excluded -Pulm Dr. Melvyn Novas consulted--recommended thoracentesis for fluid analysis to distinguish between infection or neoplasm --- at this point patient is reluctant to proceed with the procedure -He recommended continue current treatment IV antibiotics We will follow closely  Diarrhea -Noted to have significant diarrhea over the past 2 weeks. -Currently improved, reporting of 2 stools in past 24 hours -CT abdomen does not indicate any clear-cut colitis -Stool for C. difficile is negative -Use Imodium as needed -Initiating lactobacillus  Urinary tract infection -Urine culture positive for E. Coli -This is being covered by current antibiotics   Iron deficiency anemia -Hemoglobin dropped 8.0, 8.1, 6.4 >>> 7.1 -Pros and cons of blood transfusion discussed with patient and daughter at bedside Repeating H&H came anticipating 1 units of PRBC transfusion today -She was noted to have heme positive stools -Per patient, she does not have any melena or hematochezia -Continue on PPI -Anemia panel indicates iron deficiency -Seen by GI, appreciate input -She will need EGD/colonoscopy once medically stable -We will consider IV  iron infusion once infectious process has stabilized -Overall hemoglobin appears to be stable -Transfuse for hemoglobin less than 7  Seizure disorder -Continue on Keppra -Stable  Hyperlipidemia -Continue on statin  Hypertension -Blood pressure is  trending up -Resume Coreg and amlodipine  Anxiety -Continue on home dose of Xanax -Stable  Generalized weakness -Seen by physical therapy with recommendations for skilled nursing facility placement -   DVT prophylaxis: SCDs Start: 05/01/20 1907  Code Status: DNR Family Communication: Discussed with son at the bedside Disposition Plan: Status is: Inpatient  The patient will require care spanning > 2 midnights and should be moved to inpatient because: IV treatments appropriate due to intensity of illness or inability to take PO  Dispo: The patient is from: Home              Anticipated d/c is to: TBD, may need placement--PT recommending SNF              Patient currently is not medically stable to d/c.   Difficult to place patient No   Consultants:   GI  Procedures:   None    Urine culture from 05/01/2020  >> > 100 K E. coli colonies  blood cultures from 05/01/2020  >> growing staph epididymis likely contaminant  5-22 blood cultures x2  >>>  antimicrobials:   Ceftriaxone 4/29 > 4/30  Cefepime 4/30 >  Azithromycin 4/29 >  Flagyl 4/30 >   Objective: Vitals:   05/05/20 0603 05/05/20 0900 05/05/20 1145 05/05/20 1215  BP: (!) 168/80 134/68 140/79 139/62  Pulse: 93 (!) 101 87 88  Resp: _0 Temp: (!) 97.4 F (36.3 C)  98.5 F (36.9 C) 98 F (36.7 C)  TempSrc:   Oral Axillary  SpO2: 96%  96% 96%  Weight:      Height:        Intake/Output Summary (Last 24 hours) at 05/05/2020 1324 Last data filed at 05/05/2020 0900 Gross per 24 hour  Intake 240 ml  Output 200 ml  Net 40 ml   Filed Weights   05/01/20 1127 05/01/20 1751  Weight: 72.6 kg 57.1 kg     Physical Exam:   General:  Alert, oriented, cooperative, no distress;   HEENT:  Normocephalic, PERRL, otherwise with in Normal limits   Neuro:  CNII-XII intact. , normal motor and sensation, reflexes intact   Lungs:   Clear to auscultation BL, Respirations unlabored, no wheezes / crackles  Cardio:     S1/S2, RRR, No murmure, No Rubs or Gallops   Abdomen:   Soft, non-tender, bowel sounds active all four quadrants,  no guarding or peritoneal signs.  Muscular skeletal:  Limited exam - in bed, able to move all 4 extremities, Normal strength,  2+ pulses,  symmetric, No pitting edema  Skin:  Dry, warm to touch, negative for any Rashes,  Wounds: Please see nursing documentation      Data Reviewed: I have personally reviewed following labs and imaging studies  CBC: Recent Labs  Lab 05/01/20 1140 05/01/20 1943 05/02/20 0603 05/03/20 0455 05/04/20 0608 05/04/20 0808 05/05/20 0514 05/05/20 0830 05/05/20 1100  WBC 29.1* 23.0* 21.3* 15.3* 16.1* 15.9* 16.4*  --   --   NEUTROABS 25.1* 19.5*  --   --   --  13.1*  --   --   --   HGB 7.8* 7.0* 7.4* 7.3* 8.0* 8.1* 6.4* 8.0* 7.1*  HCT 26.0* 22.8* 24.6* 23.7* 25.6* 25.3* 21.1* 26.0* 22.7*  MCV 80.5 79.4* 79.4* 77.7* 76.0* 76.2* 77.0*  --   --   PLT 522* 505* 555* 548* 608* 597* 510*  --   --    Basic Metabolic Panel: Recent Labs  Lab 05/01/20 1140 05/02/20 0603 05/03/20 0455 05/04/20 0608 05/05/20 0514  NA 131* 133* 132* 131* 132*  K 4.8 4.2 3.4* 2.7* 4.1  CL 98 100 99 93* 98  CO2 _0 GLUCOSE 109* 96 100* 111* 103*  BUN 31* 21 12 7* 11  CREATININE 0.63 0.49 0.42* 0.40* 0.41*  CALCIUM 8.5* 8.5* 8.2* 8.3* 7.9*  MG  --   --   --  1.1*  --   PHOS  --   --   --  2.1*  --    GFR: Estimated Creatinine Clearance: 38.5 mL/min (A) (by C-G formula based on SCr of 0.41 mg/dL (L)). Liver Function Tests: Recent Labs  Lab 05/01/20 1140 05/02/20 0603 05/03/20 0455 05/04/20 0608  AST 14* 17 19  --   ALT _1 --   ALKPHOS 129* 121 109  --   BILITOT 0.6 0.7 0.7  --   PROT 6.5 6.2* 6.1*  --   ALBUMIN 2.1* 1.9* 1.8* 2.0*   No results for input(s): LIPASE, AMYLASE in the last 168 hours. No results for input(s): AMMONIA in the last 168 hours. Coagulation Profile: Recent Labs  Lab 05/01/20 1140 05/02/20 0603  05/04/20 0808  INR 1.1 1.2 1.2   Cardiac Enzymes: No results for input(s): CKTOTAL, CKMB, CKMBINDEX, TROPONINI in the last 168 hours. BNP (last 3 results) No results for input(s): PROBNP in the last 8760 hours. HbA1C: No results for input(s): HGBA1C in the last 72 hours. CBG: No results for input(s): GLUCAP in the last 168 hours. Lipid Profile: No results for input(s): CHOL, HDL, LDLCALC, TRIG, CHOLHDL, LDLDIRECT in the last 72 hours. Thyroid Function Tests: No results for input(s): TSH, T4TOTAL, FREET4, T3FREE, THYROIDAB in the last 72 hours. Anemia Panel: No results for input(s): VITAMINB12, FOLATE, FERRITIN, TIBC, IRON, RETICCTPCT in the last 72 hours. Sepsis Labs: Recent Labs  Lab 05/01/20 1140 05/04/20 0608 05/04/20 0809  PROCALCITON  --  0.82  --   LATICACIDVEN 1.1  --  1.1    Recent Results (from the past 240 hour(s))  Blood Culture (routine x 2)     Status: None (Preliminary result)   Collection Time: 05/01/20 11:41 AM   Specimen: BLOOD  Result Value Ref Range Status   Specimen Description BLOOD RIGHT ARM  Final   Special Requests   Final    BOTTLES DRAWN AEROBIC AND ANAEROBIC Blood Culture adequate volume   Culture   Final    NO GROWTH 4 DAYS Performed at [facility], [address], [place], [place] [address]    Report Status PENDING  Incomplete  Blood Culture (routine x 2)     Status: Abnormal (Preliminary result)   Collection Time: 05/01/20 11:42 AM   Specimen: BLOOD  Result Value Ref Range Status   Specimen Description   Final    BLOOD LEFT ARM Performed at [facility], [address], [place], [place] [address]    Special Requests   Final    BOTTLES DRAWN AEROBIC AND ANAEROBIC Blood Culture adequate volume Performed at [facility], [address] [place], [place] [address]    Culture  Setup Time   Final    GRAM POSITIVE COCCI ANAEROBIC BOTTLE Gram Stain Report Called to,Read Back By and Verified With: HOWARDSON,M_2  BY MATTHEWS, B  5.1.22 CRITICAL RESULT CALLED TO, READ BACK BY AND VERIFIED WITH: N ONDARA RN _3  05/03/20 AH    Culture (A)  Final    STAPHYLOCOCCUS EPIDERMIDIS THE SIGNIFICANCE OF ISOLATING THIS ORGANISM FROM A SINGLE SET OF BLOOD CULTURES WHEN MULTIPLE SETS ARE DRAWN IS UNCERTAIN.  PLEASE NOTIFY THE MICROBIOLOGY DEPARTMENT WITHIN ONE WEEK IF SPECIATION AND SENSITIVITIES ARE REQUIRED. CULTURE REINCUBATED FOR BETTER GROWTH Performed at [facility], [place] [address], [place], [place] [address]    Report Status PENDING  Incomplete  Blood Culture ID Panel (Reflexed)     Status: Abnormal   Collection Time: 05/01/20 11:42 AM  Result Value Ref Range Status   Enterococcus faecalis NOT DETECTED NOT DETECTED Final   Enterococcus Faecium NOT DETECTED NOT DETECTED Final   Listeria monocytogenes NOT DETECTED NOT DETECTED Final   Staphylococcus species DETECTED (A) NOT DETECTED Final    Comment: CRITICAL RESULT CALLED TO, READ BACK BY AND VERIFIED WITH: NACKSON ONDARA,RN 05/03/2020 AT 2155 A.HUGHES    Staphylococcus aureus (BCID) NOT DETECTED NOT DETECTED Final   Staphylococcus epidermidis DETECTED (A) NOT DETECTED Final    Comment: Methicillin (oxacillin) resistant coagulase negative staphylococcus. Possible blood culture contaminant (unless isolated from more than one blood culture draw or clinical case suggests pathogenicity). No antibiotic treatment is indicated for blood  culture contaminants. CRITICAL RESULT CALLED TO, READ BACK BY AND VERIFIED WITH: NACKSON ONDARA,RN 05/03/2020 AT 2155 A.HUGHES    Staphylococcus lugdunensis NOT DETECTED NOT DETECTED Final   Streptococcus species NOT DETECTED NOT DETECTED Final   Streptococcus agalactiae NOT DETECTED NOT DETECTED Final   Streptococcus pneumoniae NOT DETECTED NOT DETECTED Final   Streptococcus pyogenes NOT DETECTED NOT DETECTED Final   A.calcoaceticus-baumannii NOT DETECTED NOT DETECTED Final   Bacteroides fragilis NOT DETECTED NOT DETECTED Final    Enterobacterales NOT DETECTED NOT DETECTED Final   Enterobacter cloacae complex NOT DETECTED NOT DETECTED Final   Escherichia coli NOT DETECTED NOT DETECTED Final   Klebsiella aerogenes NOT DETECTED NOT DETECTED Final   Klebsiella oxytoca NOT DETECTED NOT DETECTED Final   Klebsiella pneumoniae NOT DETECTED NOT DETECTED Final   Proteus species NOT DETECTED NOT DETECTED Final   Salmonella species NOT DETECTED NOT DETECTED Final   Serratia marcescens NOT DETECTED NOT DETECTED Final   Haemophilus influenzae NOT DETECTED NOT DETECTED Final   Neisseria meningitidis NOT DETECTED NOT DETECTED Final   Pseudomonas aeruginosa NOT DETECTED NOT DETECTED Final   Stenotrophomonas maltophilia NOT DETECTED NOT DETECTED Final   Candida albicans NOT DETECTED NOT DETECTED Final   Candida auris NOT DETECTED NOT DETECTED Final   Candida glabrata NOT DETECTED NOT DETECTED Final   Candida krusei NOT DETECTED NOT DETECTED Final   Candida parapsilosis NOT DETECTED NOT DETECTED Final   Candida tropicalis NOT DETECTED NOT DETECTED Final   Cryptococcus neoformans/gattii NOT DETECTED NOT DETECTED Final   Methicillin resistance mecA/C DETECTED (A) NOT DETECTED Final    Comment: CRITICAL RESULT CALLED TO, READ BACK BY AND VERIFIED WITH: NACKSON ONDARA,RN 05/03/2020 AT 2155 A.HUGHES Performed at [facility], [place] [address] [place], [place] [address]   Urine culture     Status: Abnormal   Collection Time: 05/01/20 12:15 PM   Specimen: Urine, Clean Catch  Result Value Ref Range Status   Specimen Description   Final    URINE, CLEAN CATCH Performed at [facility], [address], [place], [place] [address]    Special Requests   Final    NONE Performed at [facility], [address] [place], [place] [address]    Culture >=100,000 COLONIES/mL ESCHERICHIA COLI (A)  Final   Report Status 05/04/2020 FINAL  Final   Organism ID, Bacteria ESCHERICHIA COLI (A)  Final      Susceptibility   Escherichia coli - MIC*  AMPICILLIN >=32 RESISTANT Resistant     CEFAZOLIN <=4 SENSITIVE Sensitive     CEFEPIME <=0.12 SENSITIVE Sensitive     CEFTRIAXONE <=0.25 SENSITIVE Sensitive     CIPROFLOXACIN <=0.25 SENSITIVE Sensitive     GENTAMICIN >=16 RESISTANT Resistant     IMIPENEM <=0.25 SENSITIVE Sensitive     NITROFURANTOIN <=16 SENSITIVE Sensitive     TRIMETH/SULFA >=320 RESISTANT Resistant     AMPICILLIN/SULBACTAM >=32 RESISTANT Resistant     PIP/TAZO <=4 SENSITIVE Sensitive     * >=100,000 COLONIES/mL ESCHERICHIA COLI  Resp Panel by RT-PCR (Flu A&B, Covid) Nasopharyngeal Swab     Status: None   Collection Time: 05/01/20 12:15 PM   Specimen: Nasopharyngeal Swab; Nasopharyngeal(NP) swabs in vial transport medium  Result Value Ref Range Status   SARS Coronavirus 2 by RT PCR NEGATIVE NEGATIVE Final    Comment: (NOTE) SARS-CoV-2 target nucleic acids are NOT DETECTED.  The SARS-CoV-2 RNA is generally detectable in upper respiratory specimens during the acute phase of infection. The lowest concentration of SARS-CoV-2 viral copies this assay can detect is 138 copies/mL. A negative result does not preclude SARS-Cov-2 infection and should not be used as the sole basis for treatment or other patient management decisions. A negative result may occur with  improper specimen collection/handling, submission of specimen other than nasopharyngeal swab, presence of viral mutation(s) within the areas targeted by this assay, and inadequate number of viral copies(<138 copies/mL). A negative result must be combined with clinical observations, patient history, and epidemiological information. The expected result is Negative.  Fact Sheet for Patients:  EntrepreneurPulse.com.au  Fact Sheet for Healthcare Providers:  IncredibleEmployment.be  This test is no t yet approved or cleared by the [place] FDA and  has been authorized for detection and/or diagnosis of SARS-CoV-2 by FDA  under an Emergency Use Authorization (EUA). This EUA will remain  in effect (meaning this test can be used) for the duration of the COVID-19 declaration under Section 564(b)(1) of the Act, 21 U.S.C.section 360bbb-3(b)(1), unless the authorization is terminated  or revoked sooner.       Influenza A by PCR NEGATIVE NEGATIVE Final   Influenza B by PCR NEGATIVE NEGATIVE Final    Comment: (NOTE) The Xpert Xpress SARS-CoV-2/FLU/RSV plus assay is intended as an aid in the diagnosis of influenza from Nasopharyngeal swab specimens and should not be used as a sole basis for treatment. Nasal washings and aspirates are unacceptable for Xpert Xpress SARS-CoV-2/FLU/RSV testing.  Fact Sheet for Patients: EntrepreneurPulse.com.au  Fact Sheet for Healthcare Providers: IncredibleEmployment.be  This test is not yet approved or cleared by the [place] FDA and has been authorized for detection and/or diagnosis of SARS-CoV-2 by FDA under an Emergency Use Authorization (EUA). This EUA will remain in effect (meaning this test can be used) for the duration of the COVID-19 declaration under Section 564(b)(1) of the Act, 21 U.S.C. section 360bbb-3(b)(1), unless the authorization is terminated or revoked.  Performed at [facility], [address] [place], [place] [address]   C Difficile Quick Screen w PCR reflex     Status: None   Collection Time: 05/02/20  3:39 PM   Specimen: STOOL  Result Value Ref Range Status   C Diff antigen NEGATIVE NEGATIVE Final   C Diff toxin NEGATIVE NEGATIVE Final   C Diff interpretation No C. difficile detected.  Final    Comment: Performed at [facility], [address] [place],  [address]  Culture, blood (x 2)     Status:  None (Preliminary result)   Collection Time: 05/04/20  8:07 AM   Specimen: BLOOD  Result Value Ref Range Status   Specimen Description BLOOD LEFT ANTECUBITAL  Final   Special Requests   Final     BOTTLES DRAWN AEROBIC AND ANAEROBIC Blood Culture adequate volume   Culture   Final    NO GROWTH < 24 HOURS Performed at [facility], [address], [place], [place] [address]    Report Status PENDING  Incomplete  Culture, blood (x 2)     Status: None (Preliminary result)   Collection Time: 05/04/20  8:09 AM   Specimen: BLOOD  Result Value Ref Range Status   Specimen Description BLOOD BLOOD LEFT HAND  Final   Special Requests   Final    BOTTLES DRAWN AEROBIC AND ANAEROBIC Blood Culture adequate volume   Culture   Final    NO GROWTH < 24 HOURS Performed at [facility], [address] [place],  [address]    Report Status PENDING  Incomplete         Radiology Studies: No results found.      Scheduled Meds: . amLODipine  10 mg Oral Daily  . carvedilol  6.25 mg Oral BID WC  . diphenhydrAMINE  25 mg Oral Once  . furosemide  20 mg Intravenous Once  . furosemide  20 mg Intravenous Once  . guaiFENesin  600 mg Oral BID  . lactobacillus  1 g Oral TID WC  . latanoprost  1 drop Both Eyes QHS  . levETIRAcetam  500 mg Oral BID  . pantoprazole (PROTONIX) IV  40 mg Intravenous Q12H  . potassium chloride  60 mEq Oral BID  . risperiDONE  0.5 mg Oral BID   Continuous Infusions: . azithromycin 500 mg (05/04/20 2032)  . ceFEPime (MAXIPIME) IV 2 g (05/05/20 0917)  . metronidazole 500 mg (05/05/20 1229)     LOS: 3 days    Time spent: 65mns    SDeatra James MD [facility]   If 7PM-7AM, please contact night-coverage [email or web address]  05/05/2020, 1:24 PM

## 2020-05-05 NOTE — Care Management Important Message (Signed)
Important Message  Patient Details  Name: Beverly [name] MRN: [id number] Date of Birth: June 29, 1932   Medicare Important Message Given:  Yes     Corey Harold 05/05/2020, 12:15 PM

## 2020-05-05 NOTE — Progress Notes (Signed)
Subjective: Feels ok this morning. Denies SOB on 2L nasal canula. Appetite is improving. No nausea or vomiting. Had 1 BM yesterday, loose in consistency. No abdominal pain. No overt GI bleeding per nursing staff. States she doesn't want to have a thoracentesis.   Objective: Vital signs in last 24 hours: Temp:  [97.4 F (36.3 C)-98.8 F (37.1 C)] 97.4 F (36.3 C) (05/03 0603) Pulse Rate:  [88-113] 93 (05/03 0603) Resp:  [16-20] 18 (05/03 0603) BP: (130-168)/(65-90) 168/80 (05/03 0603) SpO2:  [96 %-97 %] 96 % (05/03 0603) Last BM Date: 06/01/20 General:   Alert and oriented, pleasant, NAD, frail, ill-appearing.  Head:  Normocephalic and atraumatic. Eyes:  No icterus, sclera clear. Conjuctiva pink.  Abdomen:  Bowel sounds present, soft, non-tender, non-distended. No HSM or hernias noted. No rebound or guarding. No masses appreciated  Extremities:  Without edema. Neurologic:  Alert and  oriented x4 Skin:  Warm and dry, intact without significant lesions.  Psych:  Normal mood and affect.  Intake/Output from previous day: 05/02 0701 - 05/03 0700 In: 960 [P.O.:960] Out: 200 [Urine:200] Intake/Output this shift: No intake/output data recorded.  Lab Results: Recent Labs    05/04/20 0608 05/04/20 0808 05/05/20 0514  WBC 16.1* 15.9* 16.4*  HGB 8.0* 8.1* 6.4*  HCT 25.6* 25.3* 21.1*  PLT 608* 597* 510*   BMET Recent Labs    05/03/20 0455 05/04/20 0608 05/05/20 0514  NA 132* 131* 132*  K 3.4* 2.7* 4.1  CL 99 93* 98  CO2 25 27 29   GLUCOSE 100* 111* 103*  BUN 12 7* 11  CREATININE 0.42* 0.40* 0.41*  CALCIUM 8.2* 8.3* 7.9*   LFT Recent Labs    05/03/20 0455 05/04/20 0608  PROT 6.1*  --   ALBUMIN 1.8* 2.0*  AST 19  --   ALT 12  --   ALKPHOS 109  --   BILITOT 0.7  --    PT/INR Recent Labs    05/04/20 0808  LABPROT 15.3*  INR 1.2   Assessment: 87 year old female admitted with community-acquired pneumonia and CT chest showing underlying lung abscess vs  mass, UTI, diarrhea for past 2 weeks, anemia with heme positive stool but without overt GI bleeding, started on sepsis protocol 5/2. Pulmonology also now on board.Tachycardia and fever improved over the last 24 hours. Thoracentesis has been ordered though patient states she doesn't want to have this completed. I have notified Dr. 7/2.   Diarrhea: Mild wall thickening noted in the sigmoid colon, potentially related to underdistention versus infectious/inflammatory colitis.  C. difficile quick screen negative.  Diarrhea seems to be tapering off with 1 recorded BM yesterday. Denies abdominal pain. No GI pathogen panel has been ordered at this point. Will hold off as patient is clinically improving. Diarrhea likely multifactorial in setting of acute illness, can't rule out infectious etiology with CT findings. She will need colonoscopy at some point to follow-up on CT findings.   Anemia with heme positive stool: Hemoglobin 7.8 on admission, down from 11.4 in January 2021.  Ferritin 70, iron <5, saturation unable to calculate.  Folate within normal limits.  Vitamin B12 elevated.  Hemoglobin this admission improved to 8.1 on 5/2, down to 6.4 this morning. She received 1750 mL of IV fluid bolus yesterday on sepsis protocol which likely contributed to hemodilution.  No overt GI bleeding per nursing staff. We have offered EGD and Colonoscopy to further evaluate her anemia, but this will have to be completed once she has clinically improved  from her acute illness. Hospitalist has ordered 2 units PRBCs.   Plan: 1.  Continue PPI BID.  2.  Agree with transfusing PRBCs today.  3.  Continue to monitor H/H. 4.  Monitor for overt GI bleding.  5.  Monitor for worsening diarrhea. Obtain GI pathogen panel if this occurs.  6.  Not currently a candidate for endoscopic evaluation. Consider EGD/TCS prior to discharge if able.  7.  We will continue to follow with you. 8.  Please note, patient let me know today she did  not want thoracentesis. I have notified Dr. Flossie Dibble.    LOS: 3 days    05/05/2020, 7:28 AM   Ermalinda Memos, PA-C [facility]

## 2020-05-05 NOTE — Progress Notes (Signed)
Physical Therapy Treatment Patient Details Name: Beverly [name] MRN: [id number] DOB: [birth year] Today's Date: 05/05/2020    History of Present Illness Beverly [name] is a 87 y.o. female with medical history significant of seizure disorder, hypertension, hyperlipidemia, dementia, is brought to the hospital for generalized weakness.  Patient's granddaughter is at bedside and provides history.  Patient has been having intermittent fevers for the past 2 weeks.  She had associated cough.  They have not noticed any significant shortness of breath.  She has had decreased p.o. intake and progressive generalized weakness.  She normally ambulates with a walker.  Due to her progressive weakness, they have had difficulty getting her out of bed.  She had gone to her primary care physician today who gave her some IV fluids.  Unfortunately, her symptoms not improved.  Today she was noted to be less responsive.  There has not been any melena, hematochezia.  No vomiting.  No abdominal pain.  Family does note that she is normally constipated, but has had diarrhea over the past 2 weeks.    PT Comments    Pt was hesitant at first to participate stating that her doctor didn't want her to get out of bed.  Pt reported discomfort and immobility of Rt UE with hesitancy to use during treatment, however was able to grasp walker and use fine after standing.  Pt required min-mod assist with general bed mobility and transfers.  Pt remained extremely forward bent once at EOB and while ambulating to chair.  Therapist had to navigate walker to chair as she was unable to do this due to weakness and forward bent posturing.  Pt also with noted BM in bed and continued as walking over to chair. Nursing aware and therapist cleaned pt up prior to sitting in chair.  Pt remained upright in recliner with nursing present.. Pt with general fatigue reports but without any acute distress.     Follow Up Recommendations        Equipment  Recommendations       Recommendations for Other Services OT consult     Precautions / Restrictions      Mobility  Bed Mobility Overal bed mobility: Needs Assistance Bed Mobility: Rolling;Supine to Sit;Sit to Supine Rolling: Mod assist;Min assist   Supine to sit: Mod assist          Transfers Overall transfer level: Needs assistance Equipment used: Rolling walker (2 wheeled)   Sit to Stand: Mod assist Stand pivot transfers: Min assist          Ambulation/Gait Ambulation/Gait assistance: Min assist;Mod assist Gait Distance (Feet): 5 Feet Assistive device: Rolling walker (2 wheeled) Gait Pattern/deviations: Step-to pattern;Trunk flexed;Shuffle Gait velocity: decreased   General Gait Details: Very slow to mobilzie from EOB to chair; therapist had to navigate walker with pt being extremely forward flexed   Stairs             Wheelchair Mobility    Modified Rankin (Stroke Patients Only)          Cognition Arousal/Alertness: Awake/alert Behavior During Therapy: WFL for tasks assessed/performed Overall Cognitive Status: Within Functional Limits for tasks assessed                                               Pertinent Vitals/Pain Pain Assessment: Faces Faces Pain Scale: Hurts a little bit Pain Location:  Rt UE and general arthritis complaints; stiffness from immobility Pain Descriptors / Indicators: Aching           PT Goals (current goals can now be found in the care plan section) Progress towards PT goals: Progressing toward goals    Frequency    Min 3X/week      PT Plan Current plan remains appropriate       AM-PAC PT "6 Clicks" Mobility   Outcome Measure  Help needed turning from your back to your side while in a flat bed without using bedrails?: A Little Help needed moving from lying on your back to sitting on the side of a flat bed without using bedrails?: A Lot Help needed moving to and from a bed to a  chair (including a wheelchair)?: A Little Help needed standing up from a chair using your arms (e.g., wheelchair or bedside chair)?: A Little Help needed to walk in hospital room?: A Lot Help needed climbing 3-5 steps with a railing? : A Lot 6 Click Score: 15    End of Session Equipment Utilized During Treatment: Gait belt Activity Tolerance: Patient limited by fatigue Patient left: in chair;with nursing/sitter in room;with call bell/phone within reach Nurse Communication: Mobility status PT Visit Diagnosis: Unsteadiness on feet (R26.81);Other abnormalities of gait and mobility (R26.89);Muscle weakness (generalized) (M62.81);Difficulty in walking, not elsewhere classified (R26.2)     Time: 8891-6945 PT Time Calculation (min) (ACUTE ONLY): 30 min  Charges:  $Therapeutic Activity: 23-37 mins                    Lurena Nida, PTA/CLT [phone]  Bascom Levels, Daivik Overley B 05/05/2020, 11:56 AM

## 2020-05-05 NOTE — Progress Notes (Signed)
Pharmacy Antibiotic Note  Beverly [name] is a 87 y.o. female admitted on 05/01/2020 with pneumonia.  Pharmacy has been consulted for cefepime dosing.  Plan: cefepime 2gm iv q12h   Monitor labs, c/s, and transition to PO antibiotics.  Height: 4\' 10"  (147.3 cm) Weight: 57.1 kg (125 lb 12.8 oz) IBW/kg (Calculated) : 40.9  Temp (24hrs), Avg:98.3 F (36.8 C), Min:97.4 F (36.3 C), Max:98.8 F (37.1 C)  Recent Labs  Lab 05/01/20 1140 05/01/20 1943 05/02/20 0603 05/03/20 0455 05/04/20 0608 05/04/20 0808 05/04/20 0809 05/05/20 0514  WBC 29.1*   < > 21.3* 15.3* 16.1* 15.9*  --  16.4*  CREATININE 0.63  --  0.49 0.42* 0.40*  --   --  0.41*  LATICACIDVEN 1.1  --   --   --   --   --  1.1  --    < > = values in this interval not displayed.    Estimated Creatinine Clearance: 38.5 mL/min (A) (by C-G formula based on SCr of 0.41 mg/dL (L)).    Allergies  Allergen Reactions  . Lisinopril Swelling  . Eggs Or Egg-Derived Products Itching    Patient reports that she can eat eggs but does not tolerate egg derived products  . Morphine And Related     Headache  . Penicillins Itching    DID THE REACTION INVOLVE: Swelling of the face/tongue/throat, SOB, or low BP? No Sudden or severe rash/hives, skin peeling, or the inside of the mouth or nose? No Did it require medical treatment? No When did it last happen? Unknown If all above answers are "NO", may proceed with cephalosporin use.     Antimicrobials this admission: 4/30 cefepime >>  4/30 azithromycin >> 4/30 metronidazole >>   Microbiology results: 4/30 CDiff: negative  4/30 BCx: staph epi- contaminant  4/30 Ucx: >100k e. coli   Thank you for allowing pharmacy to be a part of this patient's care.  5/30, PharmD Clinical Pharmacist 05/05/2020 8:22 AM

## 2020-05-06 ENCOUNTER — Encounter (HOSPITAL_COMMUNITY): Payer: Self-pay | Admitting: Internal Medicine

## 2020-05-06 DIAGNOSIS — R195 Other fecal abnormalities: Secondary | ICD-10-CM | POA: Diagnosis not present

## 2020-05-06 DIAGNOSIS — R6510 Systemic inflammatory response syndrome (SIRS) of non-infectious origin without acute organ dysfunction: Secondary | ICD-10-CM

## 2020-05-06 DIAGNOSIS — Z7189 Other specified counseling: Secondary | ICD-10-CM | POA: Diagnosis not present

## 2020-05-06 DIAGNOSIS — A419 Sepsis, unspecified organism: Secondary | ICD-10-CM | POA: Diagnosis not present

## 2020-05-06 DIAGNOSIS — Z515 Encounter for palliative care: Secondary | ICD-10-CM | POA: Diagnosis not present

## 2020-05-06 DIAGNOSIS — J189 Pneumonia, unspecified organism: Secondary | ICD-10-CM | POA: Diagnosis not present

## 2020-05-06 LAB — CULTURE, BLOOD (ROUTINE X 2)
Culture: NO GROWTH
Special Requests: ADEQUATE
Special Requests: ADEQUATE

## 2020-05-06 LAB — CBC
HCT: 31.1 % — ABNORMAL LOW (ref 36.0–46.0)
Hemoglobin: 9.9 g/dL — ABNORMAL LOW (ref 12.0–15.0)
MCH: 24.9 pg — ABNORMAL LOW (ref 26.0–34.0)
MCHC: 31.8 g/dL (ref 30.0–36.0)
MCV: 78.1 fL — ABNORMAL LOW (ref 80.0–100.0)
Platelets: 578 10*3/uL — ABNORMAL HIGH (ref 150–400)
RBC: 3.98 MIL/uL (ref 3.87–5.11)
RDW: 18.8 % — ABNORMAL HIGH (ref 11.5–15.5)
WBC: 19.7 10*3/uL — ABNORMAL HIGH (ref 4.0–10.5)
nRBC: 0.1 % (ref 0.0–0.2)

## 2020-05-06 LAB — BASIC METABOLIC PANEL
Anion gap: 8 (ref 5–15)
BUN: 14 mg/dL (ref 8–23)
CO2: 28 mmol/L (ref 22–32)
Calcium: 8.3 mg/dL — ABNORMAL LOW (ref 8.9–10.3)
Chloride: 96 mmol/L — ABNORMAL LOW (ref 98–111)
Creatinine, Ser: 0.51 mg/dL (ref 0.44–1.00)
GFR, Estimated: >60 mL/min (ref >60)
Glucose, Bld: 89 mg/dL (ref 70–99)
Potassium: 4.1 mmol/L (ref 3.5–5.1)
Sodium: 132 mmol/L — ABNORMAL LOW (ref 135–145)

## 2020-05-06 LAB — MRSA PCR SCREENING: MRSA by PCR: POSITIVE — AB

## 2020-05-06 MED ORDER — MUPIROCIN 2 % EX OINT
1.0000 "application " | TOPICAL_OINTMENT | Freq: Two times a day (BID) | CUTANEOUS | Status: DC
  Administered 2020-05-06 – 2020-05-09 (×6): 1 via NASAL
  Filled 2020-05-06: qty 22

## 2020-05-06 MED ORDER — CHLORHEXIDINE GLUCONATE CLOTH 2 % EX PADS
6.0000 | MEDICATED_PAD | Freq: Every day | CUTANEOUS | Status: DC
  Administered 2020-05-09: 6 via TOPICAL

## 2020-05-06 NOTE — Progress Notes (Signed)
PROGRESS NOTE  Beverly [name] ZOX:[id number] DOB: 01/21/1932 DOA: 05/01/2020 PCP: Benita Stabile, MD  Brief History:  87 year old female with a history of seizure disorder, hypertension, hyperlipidemia, mild dementia, admitted to the hospital with generalized weakness, fever and cough.  Found to have significant right lower lobe pneumonia.  CT indicates possible underlying lung abscess versus mass.  She is on IV antibiotics.  Also noted to have heme positive stools and anemia.  GI following.  Assessment/Plan:  Sepsis -likely source pneumonia/UTI -was present on admission  -Per sepsis protocol status post IV fluid resuscitation, on broad-spectrum antibiotics cefepime/Flagyl azithromycin  community-acquired pneumonia. -"Developed sepsis criteria, with febrile, leukocytosis--- which improved today afebrile normotensive -Currently on IV antibiotics >>> antibiotic has been broadened..  To cefepime on 4/30 -CT chest shows underlying lung abscess versus mass -Pulmonology consulted appreciate follow-up -Continue current antibiotic coverage with cefepime, azithromycin, Flagyl -Strep pneumonia antigen negative, Legionella antigen neg  Lung lesion -nodule versus versus abscess  -Findings on imaging -CT chest abdomen pelvis reviewed: Dense consolidative opacity in the right lower lobe with 3 cm central low-density lesion in the lung parenchyma. This lesion may be related to necrotic pneumonia/pulmonary abscess although neoplasm not excluded -Pulm Dr. Sherene Sires consulted--recommended thoracentesis for fluid analysis to distinguish between infection or neoplasm --- at this point patient is reluctant to proceed with the procedure -He recommended continue current treatment IV antibiotics -WBC increasing -discussed possible thoracocentesis risks, benefits, alternatives--patient currently refuses  Diarrhea -Noted to have significant diarrhea over the past 2 weeks. -Currently improved, reporting  of 2 stools in past 24 hours -CT abdomen does not indicate any clear-cut colitis -Stool for C. difficile is negative -Use Imodium as needed -continuing lactobacillus  Urinary tract infection -Urine culture positive for E. Coli -This is being covered by current antibiotics   Iron deficiency anemia -Hemoglobin dropped 8.0, 8.1, 6.4 >>> 7.1 -Pros and cons of blood transfusion discussed with patient and daughter at bedside -She was noted to have heme positive stools -Per patient, she does not have any melena or hematochezia -Continue on PPI -Anemia panel indicates iron deficiency -Seen by GI, appreciate input -She will need EGD/colonoscopy once medically stable -We will consider IV  iron infusion once infectious process has stabilized -Overall hemoglobin appears to be stable -Transfuse for hemoglobin less than 7  Seizure disorder -Continue on Keppra -Stable  Hyperlipidemia -Continue on statin  Hypertension -Blood pressure is trending up -Resume Coreg and amlodipine  Anxiety -Continue on home dose of Xanax -Stable  Generalized weakness -Seen by physical therapy with recommendations for skilled nursing facility placement -patient refuses SNF  Goals of Care -palliative consult for GOC discussion -patient currently refuses thoracocentesis       Status is: Inpatient  Remains inpatient appropriate because:IV treatments appropriate due to intensity of illness or inability to take PO   Dispo: The patient is from: Home              Anticipated d/c is to: Home              Patient currently is not medically stable to d/c.   Difficult to place patient No        Family Communication:   Daughter updated at bedside 5/4  Consultants:  Palliative; pulmonary  Code Status:  DNR  DVT Prophylaxis:  SCDs   Procedures: As Listed in Progress Note Above  Antibiotics: Cefepime 4/30>> Metronidazole 4/30>>     Subjective: Patient denies  fevers, chills,  headache, chest pain, dyspnea, nausea, vomiting, diarrhea, abdominal pain, dysuria, hematuria, hematochezia, and melena.   Objective: Vitals:   05/06/20 0341 05/06/20 0900 05/06/20 0915 05/06/20 1119  BP: (!) 151/82 (!) 147/74  140/67  Pulse: (!) 106 (!) 128 (!) 111 86  Resp: Temp: 98.8 F (37.1 C) 98.5 F (36.9 C)  98.1 F (36.7 C)  TempSrc: Oral Oral  Oral  SpO2: 93%   100%  Weight:      Height:        Intake/Output Summary (Last 24 hours) at 05/06/2020 1258 Last data filed at 05/06/2020 0900 Gross per 24 hour  Intake 1455 ml  Output 2100 ml  Net -645 ml   Weight change:  Exam:   General:  Pt is alert, follows commands appropriately, not in acute distress  HEENT: No icterus, No thrush, No neck mass, [place]/AT  Cardiovascular: RRR, S1/S2, no rubs, no gallops  Respiratory: diminished BS bilateral R>L.  No wheeze  Abdomen: Soft/+BS, non tender, non distended, no guarding  Extremities: No edema, No lymphangitis, No petechiae, No rashes, no synovitis   Data Reviewed: I have personally reviewed following labs and imaging studies Basic Metabolic Panel: Recent Labs  Lab 05/02/20 0603 05/03/20 0455 05/04/20 0608 05/05/20 0514 05/06/20 0446  NA 133* 132* 131* 132* 132*  K 4.2 3.4* 2.7* 4.1 4.1  CL 100 99 93* 98 96*  CO2 GLUCOSE 96 100* 111* 103* 89  BUN 21 12 7* 11 14  CREATININE 0.49 0.42* 0.40* 0.41* 0.51  CALCIUM 8.5* 8.2* 8.3* 7.9* 8.3*  MG  --   --  1.1*  --   --   PHOS  --   --  2.1*  --   --    Liver Function Tests: Recent Labs  Lab 05/01/20 1140 05/02/20 0603 05/03/20 0455 05/04/20 0608  AST 14* 17 19  --   ALT --   ALKPHOS 129* 121 109  --   BILITOT 0.6 0.7 0.7  --   PROT 6.5 6.2* 6.1*  --   ALBUMIN 2.1* 1.9* 1.8* 2.0*   No results for input(s): LIPASE, AMYLASE in the last 168 hours. No results for input(s): AMMONIA in the last 168 hours. Coagulation Profile: Recent Labs  Lab 05/01/20 1140 05/02/20 0603  05/04/20 0808  INR 1.1 1.2 1.2   CBC: Recent Labs  Lab 05/01/20 1140 05/01/20 1943 05/02/20 0603 05/03/20 0455 05/04/20 6433 05/04/20 0808 05/05/20 0514 05/05/20 0830 05/05/20 1100 05/05/20 1553 05/06/20 0446  WBC 29.1* 23.0*   < > 15.3* 16.1* 15.9* 16.4*  --   --   --  19.7*  NEUTROABS 25.1* 19.5*  --   --   --  13.1*  --   --   --   --   --   HGB 7.8* 7.0*   < > 7.3* 8.0* 8.1* 6.4* 8.0* 7.1* 9.9* 9.9*  HCT 26.0* 22.8*   < > 23.7* 25.6* 25.3* 21.1* 26.0* 22.7* 30.6* 31.1*  MCV 80.5 79.4*   < > 77.7* 76.0* 76.2* 77.0*  --   --   --  78.1*  PLT 522* 505*   < > 548* 608* 597* 510*  --   --   --  578*   < > = values in this interval not displayed.   Cardiac Enzymes: No results for input(s): CKTOTAL, CKMB, CKMBINDEX, TROPONINI in the last 168 hours. BNP: Invalid  input(s): POCBNP CBG: No results for input(s): GLUCAP in the last 168 hours. HbA1C: No results for input(s): HGBA1C in the last 72 hours. Urine analysis:    Component Value Date/Time   COLORURINE YELLOW 05/01/2020 1215   APPEARANCEUR CLEAR 05/01/2020 1215   LABSPEC 1.019 05/01/2020 1215   PHURINE 5.0 05/01/2020 1215   GLUCOSEU NEGATIVE 05/01/2020 1215   HGBUR NEGATIVE 05/01/2020 1215   BILIRUBINUR NEGATIVE 05/01/2020 1215   KETONESUR NEGATIVE 05/01/2020 1215   PROTEINUR 30 (A) 05/01/2020 1215   UROBILINOGEN 0.2 10/18/2012 1944   NITRITE NEGATIVE 05/01/2020 1215   LEUKOCYTESUR MODERATE (A) 05/01/2020 1215   Sepsis Labs: @LABRCNTIP (procalcitonin:4,lacticidven:4) ) Recent Results (from the past 240 hour(s))  Blood Culture (routine x 2)     Status: None   Collection Time: 05/01/20 11:41 AM   Specimen: BLOOD  Result Value Ref Range Status   Specimen Description BLOOD RIGHT ARM  Final   Special Requests   Final    BOTTLES DRAWN AEROBIC AND ANAEROBIC Blood Culture adequate volume   Culture   Final    NO GROWTH 5 DAYS Performed at [facility], [address] [place], [place] [address]    Report Status  05/06/2020 FINAL  Final  Blood Culture (routine x 2)     Status: Abnormal   Collection Time: 05/01/20 11:42 AM   Specimen: BLOOD  Result Value Ref Range Status   Specimen Description   Final    BLOOD LEFT ARM Performed at [facility], [address] [place], [place] [address]    Special Requests   Final    BOTTLES DRAWN AEROBIC AND ANAEROBIC Blood Culture adequate volume Performed at [facility], [address] [place], [place] [address]    Culture  Setup Time   Final    GRAM POSITIVE COCCI ANAEROBIC BOTTLE Gram Stain Report Called to,Read Back By and Verified With: HOWARDSON,M@1416  BY MATTHEWS, B 5.1.22 CRITICAL RESULT CALLED TO, READ BACK BY AND VERIFIED WITH: N ONDARA RN @2155  05/03/20 AH    Culture (A)  Final    STAPHYLOCOCCUS EPIDERMIDIS THE SIGNIFICANCE OF ISOLATING THIS ORGANISM FROM A SINGLE SET OF BLOOD CULTURES WHEN MULTIPLE SETS ARE DRAWN IS UNCERTAIN. PLEASE NOTIFY THE MICROBIOLOGY DEPARTMENT WITHIN ONE WEEK IF SPECIATION AND SENSITIVITIES ARE REQUIRED. Performed at [facility], [address] [place], [place] [address]    Report Status 05/06/2020 FINAL  Final  Blood Culture ID Panel (Reflexed)     Status: Abnormal   Collection Time: 05/01/20 11:42 AM  Result Value Ref Range Status   Enterococcus faecalis NOT DETECTED NOT DETECTED Final   Enterococcus Faecium NOT DETECTED NOT DETECTED Final   Listeria monocytogenes NOT DETECTED NOT DETECTED Final   Staphylococcus species DETECTED (A) NOT DETECTED Final    Comment: CRITICAL RESULT CALLED TO, READ BACK BY AND VERIFIED WITH: NACKSON ONDARA,RN 05/03/2020 AT 2155 A.HUGHES    Staphylococcus aureus (BCID) NOT DETECTED NOT DETECTED Final   Staphylococcus epidermidis DETECTED (A) NOT DETECTED Final    Comment: Methicillin (oxacillin) resistant coagulase negative staphylococcus. Possible blood culture contaminant (unless isolated from more than one blood culture draw or clinical case suggests pathogenicity). No  antibiotic treatment is indicated for blood  culture contaminants. CRITICAL RESULT CALLED TO, READ BACK BY AND VERIFIED WITH: NACKSON ONDARA,RN 05/03/2020 AT 2155 A.HUGHES    Staphylococcus lugdunensis NOT DETECTED NOT DETECTED Final   Streptococcus species NOT DETECTED NOT DETECTED Final   Streptococcus agalactiae NOT DETECTED NOT DETECTED Final   Streptococcus pneumoniae NOT  DETECTED NOT DETECTED Final   Streptococcus pyogenes NOT DETECTED NOT DETECTED Final   A.calcoaceticus-baumannii NOT DETECTED NOT DETECTED Final   Bacteroides fragilis NOT DETECTED NOT DETECTED Final   Enterobacterales NOT DETECTED NOT DETECTED Final   Enterobacter cloacae complex NOT DETECTED NOT DETECTED Final   Escherichia coli NOT DETECTED NOT DETECTED Final   Klebsiella aerogenes NOT DETECTED NOT DETECTED Final   Klebsiella oxytoca NOT DETECTED NOT DETECTED Final   Klebsiella pneumoniae NOT DETECTED NOT DETECTED Final   Proteus species NOT DETECTED NOT DETECTED Final   Salmonella species NOT DETECTED NOT DETECTED Final   Serratia marcescens NOT DETECTED NOT DETECTED Final   Haemophilus influenzae NOT DETECTED NOT DETECTED Final   Neisseria meningitidis NOT DETECTED NOT DETECTED Final   Pseudomonas aeruginosa NOT DETECTED NOT DETECTED Final   Stenotrophomonas maltophilia NOT DETECTED NOT DETECTED Final   Candida albicans NOT DETECTED NOT DETECTED Final   Candida auris NOT DETECTED NOT DETECTED Final   Candida glabrata NOT DETECTED NOT DETECTED Final   Candida krusei NOT DETECTED NOT DETECTED Final   Candida parapsilosis NOT DETECTED NOT DETECTED Final   Candida tropicalis NOT DETECTED NOT DETECTED Final   Cryptococcus neoformans/gattii NOT DETECTED NOT DETECTED Final   Methicillin resistance mecA/C DETECTED (A) NOT DETECTED Final    Comment: CRITICAL RESULT CALLED TO, READ BACK BY AND VERIFIED WITH: NACKSON ONDARA,RN 05/03/2020 AT 2155 A.HUGHES Performed at [facility], [address],  [place], [place] [address]   Urine culture     Status: Abnormal   Collection Time: 05/01/20 12:15 PM   Specimen: Urine, Clean Catch  Result Value Ref Range Status   Specimen Description   Final    URINE, CLEAN CATCH Performed at [facility], [address] [place], [place] [address]    Special Requests   Final    NONE Performed at [facility], [address] [place], [place] [address]    Culture >=100,000 COLONIES/mL ESCHERICHIA COLI (A)  Final   Report Status 05/04/2020 FINAL  Final   Organism ID, Bacteria ESCHERICHIA COLI (A)  Final      Susceptibility   Escherichia coli - MIC*    AMPICILLIN >=32 RESISTANT Resistant     CEFAZOLIN <=4 SENSITIVE Sensitive     CEFEPIME <=0.12 SENSITIVE Sensitive     CEFTRIAXONE <=0.25 SENSITIVE Sensitive     CIPROFLOXACIN <=0.25 SENSITIVE Sensitive     GENTAMICIN >=16 RESISTANT Resistant     IMIPENEM <=0.25 SENSITIVE Sensitive     NITROFURANTOIN <=16 SENSITIVE Sensitive     TRIMETH/SULFA >=320 RESISTANT Resistant     AMPICILLIN/SULBACTAM >=32 RESISTANT Resistant     PIP/TAZO <=4 SENSITIVE Sensitive     * >=100,000 COLONIES/mL ESCHERICHIA COLI  Resp Panel by RT-PCR (Flu A&B, Covid) Nasopharyngeal Swab     Status: None   Collection Time: 05/01/20 12:15 PM   Specimen: Nasopharyngeal Swab; Nasopharyngeal(NP) swabs in vial transport medium  Result Value Ref Range Status   SARS Coronavirus 2 by RT PCR NEGATIVE NEGATIVE Final    Comment: (NOTE) SARS-CoV-2 target nucleic acids are NOT DETECTED.  The SARS-CoV-2 RNA is generally detectable in upper respiratory specimens during the acute phase of infection. The lowest concentration of SARS-CoV-2 viral copies this assay can detect is 138 copies/mL. A negative result does not preclude SARS-Cov-2 infection and should not be used as the sole basis for treatment or other patient management decisions. A negative result may occur with  improper specimen collection/handling, submission of specimen other than  nasopharyngeal swab, presence of viral mutation(s) within the areas targeted by this assay, and inadequate number of viral copies(<138 copies/mL). A negative result must be combined with clinical observations, patient history, and epidemiological information. The expected result is Negative.  Fact Sheet for Patients:  BloggerCourse.com  Fact Sheet for Healthcare Providers:  SeriousBroker.it  This test is no t yet approved or cleared by the [place] FDA and  has been authorized for detection and/or diagnosis of SARS-CoV-2 by FDA under an Emergency Use Authorization (EUA). This EUA will remain  in effect (meaning this test can be used) for the duration of the COVID-19 declaration under Section 564(b)(1) of the Act, 21 U.S.C.section 360bbb-3(b)(1), unless the authorization is terminated  or revoked sooner.       Influenza A by PCR NEGATIVE NEGATIVE Final   Influenza B by PCR NEGATIVE NEGATIVE Final    Comment: (NOTE) The Xpert Xpress SARS-CoV-2/FLU/RSV plus assay is intended as an aid in the diagnosis of influenza from Nasopharyngeal swab specimens and should not be used as a sole basis for treatment. Nasal washings and aspirates are unacceptable for Xpert Xpress SARS-CoV-2/FLU/RSV testing.  Fact Sheet for Patients: BloggerCourse.com  Fact Sheet for Healthcare Providers: SeriousBroker.it  This test is not yet approved or cleared by the [place] FDA and has been authorized for detection and/or diagnosis of SARS-CoV-2 by FDA under an Emergency Use Authorization (EUA). This EUA will remain in effect (meaning this test can be used) for the duration of the COVID-19 declaration under Section 564(b)(1) of the Act, 21 U.S.C. section 360bbb-3(b)(1), unless the authorization is terminated or revoked.  Performed at [facility], [address] [place], [place] [address]   C  Difficile Quick Screen w PCR reflex     Status: None   Collection Time: 05/02/20  3:39 PM   Specimen: STOOL  Result Value Ref Range Status   C Diff antigen NEGATIVE NEGATIVE Final   C Diff toxin NEGATIVE NEGATIVE Final   C Diff interpretation No C. difficile detected.  Final    Comment: Performed at [facility], [address] [place], [place] [address]  Culture, blood (x 2)     Status: None (Preliminary result)   Collection Time: 05/04/20  8:07 AM   Specimen: BLOOD  Result Value Ref Range Status   Specimen Description BLOOD LEFT ANTECUBITAL  Final   Special Requests   Final    BOTTLES DRAWN AEROBIC AND ANAEROBIC Blood Culture adequate volume   Culture   Final    NO GROWTH 2 DAYS Performed at [facility], [address] [place], [place] [address]    Report Status PENDING  Incomplete  Culture, blood (x 2)     Status: None (Preliminary result)   Collection Time: 05/04/20  8:09 AM   Specimen: BLOOD  Result Value Ref Range Status   Specimen Description BLOOD BLOOD LEFT HAND  Final   Special Requests   Final    BOTTLES DRAWN AEROBIC AND ANAEROBIC Blood Culture adequate volume   Culture   Final    NO GROWTH 2 DAYS Performed at [facility], [address], [place], [place] [address]    Report Status PENDING  Incomplete     Scheduled Meds: . amLODipine  10 mg Oral Daily  . carvedilol  6.25 mg Oral BID WC  . diphenhydrAMINE  25 mg Oral Once  . furosemide  20 mg Intravenous Once  . guaiFENesin  600 mg Oral BID  . lactobacillus  1 g Oral  TID WC  . latanoprost  1 drop Both Eyes QHS  . levETIRAcetam  500 mg Oral BID  . pantoprazole (PROTONIX) IV  40 mg Intravenous Q12H  . potassium chloride  60 mEq Oral BID  . risperiDONE  0.5 mg Oral BID   Continuous Infusions: . ceFEPime (MAXIPIME) IV 2 g (05/06/20 1046)  . metronidazole 500 mg (05/06/20 1214)    Procedures/Studies: CT CHEST ABDOMEN PELVIS W CONTRAST  Result Date: 05/02/2020 CLINICAL DATA:  Sepsis.  Community-acquired  pneumonia. EXAM: CT CHEST, ABDOMEN, AND PELVIS WITH CONTRAST TECHNIQUE: Multidetector CT imaging of the chest, abdomen and pelvis was performed following the standard protocol during bolus administration of intravenous contrast. CONTRAST:  OMNIPAQUE IOHEXOL 300 MG/ML  SOLN COMPARISON:  No comparison studies available. FINDINGS: CT CHEST FINDINGS Cardiovascular: The heart size is normal. No substantial pericardial effusion. Coronary artery calcification is evident. Mitral annular calcification noted. Atherosclerotic calcification is noted in the wall of the thoracic aorta. Mediastinum/Nodes: Mediastinal lymphadenopathy evident. 15 mm short axis precarinal node shows central necrosis. 2.3 cm short axis subcarinal lymph node evident. No left hilar lymphadenopathy. Soft tissue fullness noted in the right hilum. Moderate to large hiatal hernia. There is no axillary lymphadenopathy. Lungs/Pleura: Dense consolidative opacity noted in the right lower lobe with 3 cm central low-density lesion in the lung parenchyma. This is associated with mild left base collapse/consolidation, moderate right pleural effusion and small left pleural effusion. Patchy areas of peripheral airspace disease are noted in the left upper lobe. Musculoskeletal: Bones are diffusely demineralized age-indeterminate nondisplaced fractures of the anterior right third and fourth ribs noted. Degenerative changes are evident in both shoulders, right greater than left. CT ABDOMEN PELVIS FINDINGS Hepatobiliary: No suspicious focal abnormality within the liver parenchyma. Gallbladder is distended. No intrahepatic or extrahepatic biliary dilation. Pancreas: No focal mass lesion. No dilatation of the main duct. No intraparenchymal cyst. No peripancreatic edema. Spleen: No splenomegaly. No focal mass lesion. Adrenals/Urinary Tract: No adrenal nodule or mass. Kidneys unremarkable. No evidence for hydroureter. The urinary bladder appears normal for the degree  of distention. Stomach/Bowel: Moderate to large hiatal hernia Duodenum is normally positioned as is the ligament of Treitz. No small bowel wall thickening. No small bowel dilatation. The terminal ileum is normal. The appendix is not well visualized, but there is no edema or inflammation in the region of the cecum. Mild wall thickening noted in the sigmoid colon, potentially related to underdistention although infectious/inflammatory colitis not excluded. Vascular/Lymphatic: There is abdominal aortic atherosclerosis without aneurysm. There is no gastrohepatic or hepatoduodenal ligament lymphadenopathy. No retroperitoneal or mesenteric lymphadenopathy. No pelvic sidewall lymphadenopathy. Reproductive: Uterus surgically absent.  There is no adnexal mass. Other: Trace free fluid seen in the pelvis and in the left paracolic gutter. Musculoskeletal: No worrisome lytic or sclerotic osseous abnormality. Advanced degenerative disc disease noted lumbar spine. IMPRESSION: 1. Dense consolidative opacity in the right lower lobe with 3 cm central low-density lesion in the lung parenchyma. This lesion may be related to necrotic pneumonia/pulmonary abscess although neoplasm not excluded. 2. Mediastinal and right hilar lymphadenopathy is. Subcarinal lymph node is somewhat more bulky than typically seen for reactive etiology and metastatic disease is a concern. 3. Patchy areas of peripheral airspace disease in the left upper lobe, likely infectious/inflammatory. 4. Moderate to large hiatal hernia. 5. Mild wall thickening in the sigmoid colon, potentially related to underdistention although infectious/inflammatory colitis not excluded. 6. Trace free fluid in the pelvis and left paracolic gutter. 7. Aortic Atherosclerosis (ICD10-I70.0). Electronically Signed  By: Kennith Center M.D.   On: 05/02/2020 14:21   DG Chest Port 1 View  Result Date: 05/01/2020 CLINICAL DATA:  Sepsis. EXAM: PORTABLE CHEST 1 VIEW COMPARISON:  January 10, 2019. FINDINGS: Stable cardiomediastinal silhouette. Stable hiatal hernia. No pneumothorax is noted. Left lung is clear. New large rounded opacity is noted in right lower lobe which may represent pneumonia, but neoplasm cannot be excluded. Mild right basilar subsegmental atelectasis and right pleural effusion is noted as well. Bony thorax is unremarkable. IMPRESSION: New large ill-defined rounded opacity is noted in right lower lobe which may represent pneumonia, but neoplasm cannot be excluded. CT scan of the chest with intravenous contrast is recommended for further evaluation. Aortic Atherosclerosis (ICD10-I70.0). Electronically Signed   By: Lupita Raider M.D.   On: 05/01/2020 12:56   DG HIPS BILAT WITH PELVIS MIN 5 VIEWS  Result Date: 05/01/2020 CLINICAL DATA:  Left hip pain after possible fall. EXAM: DG HIP (WITH OR WITHOUT PELVIS) 5+V BILAT COMPARISON:  None. FINDINGS: There is no evidence of hip fracture or dislocation. Moderate narrowing of the right hip joint is noted. IMPRESSION: Moderate osteoarthritis of the right hip. No acute abnormality is noted in either hip. Electronically Signed   By: Lupita Raider M.D.   On: 05/01/2020 12:59    Catarina Hartshorn, DO  [facility]  If 7PM-7AM, please contact night-coverage [email or web address] Password [email or web address] 05/06/2020, 12:58 PM   LOS: 4 days

## 2020-05-06 NOTE — Progress Notes (Signed)
Physical Therapy Note  Patient Details  Name: Beverly [name] MRN: [id number] Date of Birth: [birth year] Today's Date: 05/06/2020    Pt declined treatment today stating she had been up most of the day and was very tired.  Hard to keep her eyes opened while talking with therapist.   Lurena Nida  05/06/2020, 4:39 PM

## 2020-05-06 NOTE — Progress Notes (Addendum)
Patient out of bed to chair this am, she also used the [facility]. B/P 147/74,hr 128,Dr Tat notified. No c/o pain or discomfort noted. Family at bedside.Wil continue to monitor patient.

## 2020-05-06 NOTE — Progress Notes (Signed)
    Subjective: Diarrhea resolved. No abdominal pain. No N/V. No overt GI bleeding. On nasal cannula oxygen. Denies shortness of breath. Family desires procedures to be undertaken prior to discharge instead of outpatient.   Objective: Vital signs in last 24 hours: Temp:  [98 F (36.7 C)-98.8 F (37.1 C)] 98.1 F (36.7 C) (05/04 1119) Pulse Rate:  [86-128] 86 (05/04 1119) Resp:  [17-19] 17 (05/04 1119) BP: (133-159)/(62-82) 140/67 (05/04 1119) SpO2:  [93 %-100 %] 100 % (05/04 1119) Last BM Date: 05/06/20 General:   Alert and oriented, pleasant, frail, chronically ill-appearing Head:  Normocephalic and atraumatic. Abdomen:  Bowel sounds present, soft, non-tender, non-distended. No HSM or hernias noted. No rebound or guarding. No masses appreciated  Extremities:  Without  edema. Neurologic:  Alert and  oriented x4   Intake/Output from previous day: 05/03 0701 - 05/04 0700 In: 1455 [P.O.:240; Blood:315; IV Piggyback:900] Out: 2100 [Urine:2100] Intake/Output this shift: Total I/O In: 240 [P.O.:240] Out: -   Lab Results: Recent Labs    05/04/20 0808 05/05/20 0514 05/05/20 0830 05/05/20 1100 05/05/20 1553 05/06/20 0446  WBC 15.9* 16.4*  --   --   --  19.7*  HGB 8.1* 6.4*   < > 7.1* 9.9* 9.9*  HCT 25.3* 21.1*   < > 22.7* 30.6* 31.1*  PLT 597* 510*  --   --   --  578*   < > = values in this interval not displayed.   BMET Recent Labs    05/04/20 0608 05/05/20 0514 05/06/20 0446  NA 131* 132* 132*  K 2.7* 4.1 4.1  CL 93* 98 96*  CO2 27 29 28   GLUCOSE 111* 103* 89  BUN 7* 11 14  CREATININE 0.40* 0.41* 0.51  CALCIUM 8.3* 7.9* 8.3*   LFT Recent Labs    05/04/20 0608  ALBUMIN 2.0*   PT/INR Recent Labs    05/04/20 0808  LABPROT 15.3*  INR 1.2    Assessment: 87 year old female admitted with sepsis secondary to community-acquired pneumonia and CT chest showing underlying lung abscess vs mass, UTI, diarrhea for past 2 weeks, anemia with heme positive stool.  Pulmonology also now on board. Thoracentesis ordered though patient declines.   Diarrhea:resolved. mild wall thickening noted in the sigmoid colon, potentially related to underdistention versus infectious/inflammatory colitis.  C. difficile quick screen negative.     Anemia with heme positive stool: Hemoglobin 7.8 on admission, down from 11.4 in January 2021.  Ferritin 70, iron <5, saturation unable to calculate.  Drop in Hgb yesterday to 6.4 and received 1 unit PRBCs yesterday with improvement to 9.9 today. Family denies any overt GI bleeding today. Some concern for small amount of  melena yesterday.  Family and patient desire endoscopic procedures prior to discharge if clinically appropriate. As of note, last colonoscopy in remote past (not in system) but no prior EGD.    Plan: Continue with PPI BID Transfuse as needed Monitor for overt GI bleeding Will continue to follow for candidacy of endoscopic procedures  February 2021, PhD, ANP-BC [facility]    LOS: 4 days    05/06/2020, 11:27 AM

## 2020-05-06 NOTE — Progress Notes (Signed)
Alert and oriented to person and place. C/o chronic pain. Pain medication given and went to sleep. Daughter at bedside this shift. No concerns voiced. Pt needs 1 person assist with adls. purewick in use. Call bell within reach. Continue plan of care.

## 2020-05-06 NOTE — Progress Notes (Signed)
   05/06/20 0900  Assess: MEWS Score  Temp 98.5 F (36.9 C)  BP (!) 147/74  Pulse Rate (!) 128  Resp 18  Assess: MEWS Score  MEWS Temp 0  MEWS Systolic 0  MEWS Pulse 2  MEWS RR 0  MEWS LOC 0  MEWS Score 2  MEWS Score Color Yellow  Assess: if the MEWS score is Yellow or Red  Were vital signs taken at a resting state? Yes  Focused Assessment No change from prior assessment  Treat  Pain Score 0  Breathing 0  Negative Vocalization 0  Notify: Provider  Provider Name/Title Dr Tat  Date Provider Notified 05/06/20  Time Provider Notified 0900  Notification Type Page  Notification Reason Other (Comment) (hr 128)  Provider response No new orders  Document  Patient Outcome Other (Comment) (patient stable.)  Progress note created (see row info) Yes

## 2020-05-06 NOTE — Consult Note (Signed)
Consultation Note Date: 05/06/2020   Patient Name: Beverly [name]  DOB: 12-27-1932  MRN: [id number]  Age / Sex: 87 y.o., female  PCP: Celene Squibb, MD Referring Physician: Orson Eva, MD  Reason for Consultation: Establishing goals of care and Psychosocial/spiritual support  HPI/Patient Profile: 87 y.o. female  with past medical history of COPD, CAD with STEMI, seizure disorder, HTN/HLD, mild dementia, anxiety, OA admitted on 05/01/2020 with sepsis, likely source UTI/PNA.   Clinical Assessment and Goals of Care:  I have reviewed medical records including EPIC notes, labs and imaging, received report from bedside nursing staff, examined the patient and met at bedside with son-in-law Shanon Brow to discuss diagnosis prognosis, [place], EOL wishes, disposition and options.  Beverly [name] is lying quietly in bed.  She greets me making and mostly keeping eye contact.  She is alert and oriented to person and place, but with known memory loss.  She is able to make her basic needs known.  I introduced Palliative Medicine as specialized medical care for people living with serious illness. It focuses on providing relief from the symptoms and stress of a serious illness.   We discussed a brief life review of the patient.  Beverly [name] is a widow.  She has lived with her daughter Butch Penny and son-in-law Shanon Brow for the last 3 years.  As far as functional and nutritional status, she has some assistance with ADLs  We discussed her current illness and what it means in the larger context of her on-going co-morbidities.  Beverly [name] states that she is unsure about thoracentesis for diagnostic purposes.  I asked her if she is concerned that this might be cancer, and that she just does not want to know if it he is.  She denies this.  After further discussion she somewhat endorses that she is fearful that she would have lung collapse or other issues after  testing.   I attempted to elicit values and goals of care important to the patient.   Son-in-law Shanon Brow asks her if she is "giving up", sharing his experiences with his father.  He states that he which she had "fought harder" for his father.  Shanon Brow does endorse that Beverly [name] is stating at home that she is 75, and feels that she has lived a full life.  He later shares in private that she has had hallucinations of her deceased husband's body in the bed with her decaying.  This has lessened after medications from primary care.  The difference between aggressive medical intervention and comfort care was considered in light of the patient's goals of care.  We talked about treating the treatable.  At this point Beverly [name] continues to decline thoracentesis.  She would clearly benefit from outpatient palliative services. Questions and concerns were addressed.  The family was encouraged to call with questions or concerns.  PMT to continue to follow.  Conference with attending, bedside nursing staff related to patient condition, needs, goals of care.   HCPOA   HCPOA -Beverly [name] tells me  that her daughter, Elby Showers, is her healthcare surrogate.  She has a son also.    SUMMARY OF RECOMMENDATIONS   At this point continue to treat the treatable but no CPR or intubation Continues to decline thoracentesis, unable to clearly verbalize why   Code Status/Advance Care Planning:  DNR  Symptom Management:   Per hospitalist, no additional needs at this time.  Palliative Prophylaxis:   Frequent Pain Assessment and Oral Care  Additional Recommendations (Limitations, Scope, Preferences):  Continue to treat the treatable but no CPR or intubation  Psycho-social/Spiritual:   Desire for further Chaplaincy support:no  Additional Recommendations: Caregiving  Support/Resources and Education on Hospice  Prognosis:  Unable to determine, based on outcomes, further test results. Discharge Planning:  Anticipate home with home health      Primary Diagnoses: Present on Admission: . CAP (community acquired pneumonia) . HTN (hypertension) . Dyslipidemia (high LDL; low HDL) . Anemia . Heme positive stool . Pneumonia   I have reviewed the medical record, interviewed the patient and family, and examined the patient. The following aspects are pertinent.  Past Medical History:  Diagnosis Date  . Anxiety   . Arthritis   . COPD (chronic obstructive pulmonary disease) ([place])   . Coronary artery disease    a. STEMI with cardiac arrest (polymorphic VT) s/p DES to RCA.  Marland Kitchen GERD (gastroesophageal reflux disease)   . Headache(784.0)   . Hearing loss, central    Left ear  . Hematoma   . Hypertension   . Memory changes   . Mild aortic stenosis   . Mild carotid artery disease ([place])    a. 1-39% by duplex 02/2018.  Marland Kitchen Seizures ([place]) 10/2012   No seizures, speech issues; after a fall, hitting head on left side   Social History   Socioeconomic History  . Marital status: Married    Spouse name: Not on file  . Number of children: Not on file  . Years of education: Not on file  . Highest education level: Not on file  Occupational History  . Not on file  Tobacco Use  . Smoking status: Never Smoker  . Smokeless tobacco: Never Used  Vaping Use  . Vaping Use: Never used  Substance and Sexual Activity  . Alcohol use: No  . Drug use: No  . Sexual activity: Not on file  Other Topics Concern  . Not on file  Social History Narrative  . Not on file   Social Determinants of Health   Financial Resource Strain: Not on file  Food Insecurity: Not on file  Transportation Needs: Not on file  Physical Activity: Not on file  Stress: Not on file  Social Connections: Not on file   Family History  Problem Relation Age of Onset  . Cancer Father   . Ataxia Neg Hx   . Chorea Neg Hx   . Dementia Neg Hx   . Mental retardation Neg Hx   . Migraines Neg Hx   . Multiple sclerosis Neg Hx   .  Neurofibromatosis Neg Hx   . Neuropathy Neg Hx   . Parkinsonism Neg Hx   . Seizures Neg Hx   . Stroke Neg Hx    Scheduled Meds: . amLODipine  10 mg Oral Daily  . carvedilol  6.25 mg Oral BID WC  . diphenhydrAMINE  25 mg Oral Once  . furosemide  20 mg Intravenous Once  . guaiFENesin  600 mg Oral BID  . lactobacillus  1 g Oral TID  WC  . latanoprost  1 drop Both Eyes QHS  . levETIRAcetam  500 mg Oral BID  . pantoprazole (PROTONIX) IV  40 mg Intravenous Q12H  . risperiDONE  0.5 mg Oral BID   Continuous Infusions: . ceFEPime (MAXIPIME) IV 2 g (05/06/20 1046)  . metronidazole 500 mg (05/06/20 1214)   PRN Meds:.acetaminophen **OR** acetaminophen, albuterol, ALPRAZolam, guaiFENesin, HYDROcodone-acetaminophen, ondansetron **OR** ondansetron (ZOFRAN) IV Medications Prior to Admission:  Prior to Admission medications   Medication Sig Start Date End Date Taking? Authorizing Provider  acetaminophen (TYLENOL) 500 MG tablet Take 1 tablet (500 mg total) by mouth as needed (no more than 3 gm / day). 12/04/19  Yes Cleaver, Jossie Ng, NP  ALPRAZolam Duanne Moron) 0.5 MG tablet Take 0.5 mg by mouth 3 (three) times daily as needed for anxiety.   Yes [provider]  aspirin 81 MG chewable tablet Chew 1 tablet (81 mg total) by mouth daily. 01/13/18  Yes Kathyrn Drown D, NP  atorvastatin (LIPITOR) 80 MG tablet Take 1 tablet (80 mg total) by mouth daily at 6 PM. 01/12/18  Yes Kathyrn Drown D, NP  carvedilol (COREG) 6.25 MG tablet TAKE ONE TABLET (6.25MG TOTAL) BY MOUTH TWO TIMES DAILY 04/10/20  Yes Satira Sark, MD  celecoxib (CELEBREX) 200 MG capsule Take 200 mg by mouth daily.   Yes [provider]  cholecalciferol (VITAMIN D) 1000 UNITS tablet Take 1,000 Units by mouth daily.    Yes [provider]  fluticasone (FLONASE) 50 MCG/ACT nasal spray Place 1 spray into both nostrils daily.  09/18/12  Yes [provider]  HYDROcodone-acetaminophen (NORCO) 10-325 MG tablet Take 1  tablet by mouth every 6 (six) hours as needed.   Yes [provider]  levETIRAcetam (KEPPRA) 500 MG tablet Take 500 mg by mouth 2 (two) times daily.   Yes [provider]  methocarbamol (ROBAXIN) 500 MG tablet Take 1 tablet by mouth in the morning and at bedtime.  02/22/19  Yes [provider]  nitroGLYCERIN (NITROSTAT) 0.4 MG SL tablet Place 1 tablet (0.4 mg total) under the tongue every 5 (five) minutes x 3 doses as needed for chest pain. 01/12/18  Yes Kathyrn Drown D, NP  pantoprazole (PROTONIX) 40 MG tablet Take 40 mg by mouth daily.   Yes [provider]  potassium chloride (K-DUR) 10 MEQ tablet Take 10 mEq by mouth daily.    Yes [provider]  Propylene Glycol (SYSTANE BALANCE OP) Place 1-2 drops into both eyes daily as needed (dry eyes).   Yes [provider]  risperiDONE (RISPERDAL) 0.5 MG tablet Take 0.5 mg by mouth 2 (two) times daily. 04/23/20  Yes [provider]  amLODipine (NORVASC) 5 MG tablet Take 1 tablet (5 mg total) by mouth daily. 05/29/19 12/04/19  Satira Sark, MD  latanoprost (XALATAN) 0.005 % ophthalmic solution INSTILL ONE DROP IN BOTH EYES NIGHTLY 05/06/20   Rankin, Clent Demark, MD  lisinopril (ZESTRIL) 20 MG tablet Take 1 tablet (20 mg total) by mouth 2 (two) times daily. 01/24/19 05/03/19  Erma Heritage, PA-C   Allergies  Allergen Reactions  . Lisinopril Swelling  . Eggs Or Egg-Derived Products Itching    Patient reports that she can eat eggs but does not tolerate egg derived products  . Morphine And Related     Headache  . Penicillins Itching    DID THE REACTION INVOLVE: Swelling of the face/tongue/throat, SOB, or low BP? No Sudden or severe rash/hives, skin peeling, or the  inside of the mouth or nose? No Did it require medical treatment? No When did it last happen? Unknown If all above answers are "NO", may proceed with cephalosporin use.    Review of Systems  Unable to perform ROS: Age     Physical Exam Vitals and nursing note reviewed.  Constitutional:      General: She is not in acute distress.    Appearance: She is ill-appearing.  HENT:     Head: Atraumatic.     Mouth/Throat:     Mouth: Mucous membranes are dry.  Cardiovascular:     Rate and Rhythm: Normal rate.  Pulmonary:     Effort: Pulmonary effort is normal. No respiratory distress.  Skin:    General: Skin is warm and dry.     Coloration: Skin is pale.  Neurological:     Mental Status: She is alert.     Comments: Oriented to person and place  Psychiatric:        Mood and Affect: Mood normal.        Behavior: Behavior normal.     Comments: Calm and cooperative, not fearful     Vital Signs: BP (!) 146/75 (BP Location: Left Arm)   Pulse 94   Temp (!) 97.5 F (36.4 C) (Oral)   Resp 17   Ht 4' 10" (1.473 m)   Wt 57.1 kg   SpO2 96%   BMI 26.29 kg/m  Pain Scale: 0-10 POSS *See Group Information*: S-Acceptable,Sleep, easy to arouse Pain Score: 0-No pain   SpO2: SpO2: 96 % O2 Device:SpO2: 96 % O2 Flow Rate: .O2 Flow Rate (L/min): 2 L/min  IO: Intake/output summary:   Intake/Output Summary (Last 24 hours) at 05/06/2020 1544 Last data filed at 05/06/2020 1300 Gross per 24 hour  Intake 1200 ml  Output 2100 ml  Net -900 ml    LBM: Last BM Date: 05/06/20 Baseline Weight: Weight: 72.6 kg Most recent weight: Weight: 57.1 kg     Palliative Assessment/Data:   Flowsheet Rows   Flowsheet Row Most Recent Value  Intake Tab   Referral Department Hospitalist  Unit at Time of Referral Med/Surg Unit  Palliative Care Primary Diagnosis Sepsis/Infectious Disease  Date Notified 05/06/20  Palliative Care Type New Palliative care  Reason for referral Clarify Goals of Care  Date of Admission 05/01/20  Date first seen by Palliative Care 05/06/20  # of days Palliative referral response time 0 Day(s)  # of days IP prior to Palliative referral 5  Clinical Assessment   Palliative Performance Scale Score  40%  Pain Max last 24 hours Not able to report  Pain Min Last 24 hours Not able to report  Dyspnea Max Last 24 Hours Not able to report  Dyspnea Min Last 24 hours Not able to report  Psychosocial & Spiritual Assessment   Palliative Care Outcomes       Time In: 1520 Time Out: 1610 Time Total: 50 minutes  Greater than 50%  of this time was spent counseling and coordinating care related to the above assessment and plan.  Signed by: Drue Novel, NP   Please contact Palliative Medicine Team phone at [phone] for questions and concerns.  For individual provider: See Shea Evans

## 2020-05-07 ENCOUNTER — Inpatient Hospital Stay (HOSPITAL_COMMUNITY): Payer: Medicare Other

## 2020-05-07 ENCOUNTER — Encounter (INDEPENDENT_AMBULATORY_CARE_PROVIDER_SITE_OTHER): Payer: Medicare Other | Admitting: Ophthalmology

## 2020-05-07 DIAGNOSIS — J189 Pneumonia, unspecified organism: Secondary | ICD-10-CM | POA: Diagnosis not present

## 2020-05-07 DIAGNOSIS — Z515 Encounter for palliative care: Secondary | ICD-10-CM | POA: Diagnosis not present

## 2020-05-07 DIAGNOSIS — J90 Pleural effusion, not elsewhere classified: Secondary | ICD-10-CM | POA: Diagnosis not present

## 2020-05-07 DIAGNOSIS — D649 Anemia, unspecified: Secondary | ICD-10-CM | POA: Diagnosis not present

## 2020-05-07 DIAGNOSIS — Z7189 Other specified counseling: Secondary | ICD-10-CM | POA: Diagnosis not present

## 2020-05-07 DIAGNOSIS — A419 Sepsis, unspecified organism: Secondary | ICD-10-CM

## 2020-05-07 DIAGNOSIS — R195 Other fecal abnormalities: Secondary | ICD-10-CM | POA: Diagnosis not present

## 2020-05-07 LAB — BASIC METABOLIC PANEL
Anion gap: 10 (ref 5–15)
BUN: 11 mg/dL (ref 8–23)
CO2: 26 mmol/L (ref 22–32)
Calcium: 8.2 mg/dL — ABNORMAL LOW (ref 8.9–10.3)
Chloride: 94 mmol/L — ABNORMAL LOW (ref 98–111)
Creatinine, Ser: 0.42 mg/dL — ABNORMAL LOW (ref 0.44–1.00)
GFR, Estimated: >60 mL/min (ref >60)
Glucose, Bld: 118 mg/dL — ABNORMAL HIGH (ref 70–99)
Potassium: 3.7 mmol/L (ref 3.5–5.1)
Sodium: 130 mmol/L — ABNORMAL LOW (ref 135–145)

## 2020-05-07 LAB — CBC
HCT: 31.5 % — ABNORMAL LOW (ref 36.0–46.0)
Hemoglobin: 9.7 g/dL — ABNORMAL LOW (ref 12.0–15.0)
MCH: 24.8 pg — ABNORMAL LOW (ref 26.0–34.0)
MCHC: 30.8 g/dL (ref 30.0–36.0)
MCV: 80.6 fL (ref 80.0–100.0)
Platelets: 401 10*3/uL — ABNORMAL HIGH (ref 150–400)
RBC: 3.91 MIL/uL (ref 3.87–5.11)
RDW: 20.5 % — ABNORMAL HIGH (ref 11.5–15.5)
WBC: 21.8 10*3/uL — ABNORMAL HIGH (ref 4.0–10.5)
nRBC: 0 % (ref 0.0–0.2)

## 2020-05-07 LAB — PROCALCITONIN: Procalcitonin: 0.95 ng/mL

## 2020-05-07 MED ORDER — MAGIC MOUTHWASH
2.0000 mL | Freq: Three times a day (TID) | ORAL | Status: DC | PRN
  Administered 2020-05-07: 2 mL via ORAL
  Filled 2020-05-07: qty 5

## 2020-05-07 MED ORDER — VANCOMYCIN HCL 1250 MG/250ML IV SOLN
1250.0000 mg | Freq: Once | INTRAVENOUS | Status: AC
  Administered 2020-05-07: 1250 mg via INTRAVENOUS
  Filled 2020-05-07: qty 250

## 2020-05-07 MED ORDER — VANCOMYCIN HCL 750 MG/150ML IV SOLN
750.0000 mg | INTRAVENOUS | Status: DC
  Administered 2020-05-08 – 2020-05-09 (×2): 750 mg via INTRAVENOUS
  Filled 2020-05-07 (×2): qty 150

## 2020-05-07 NOTE — Progress Notes (Signed)
    Rt thoracentesis requested in Radiology  Imaging performed in [place] Rt chest with little to no effusion noted Minimal effusion noted on left (slight larger than on Rt) No safe window to access this very small effusion  NO Thoracentesis performed today Pt sent back to room

## 2020-05-07 NOTE — Progress Notes (Signed)
Palliative: Beverly [name] is lying quietly in bed.  She is sleeping soundly with her mouth open.  She appears acutely/chronically ill and frail, pale.  She wakes as I call her name.  She will make an mostly keep eye contact.  She has known memory loss, but she is able to tell me her name.  I believe that she can make her basic needs known.  There is no family at bedside at this time.  I asked how she is feeling/doing, and she tells me "not well".  I tried to encourage her, but she tells me that she is not sure that she will be able to get better.  She complains of right arm pain and will not allow me to move her arm.  It seems that she has agreed to thoracentesis, but after ultrasound, there is no safe window to access what was described as a "very small effusion".   She has been accepted for home health services, and will need continued PT evaluation for disposition needs and continue goals of care discussions.  Call to daughter, Beverly [name].  We talked about thoracentesis, I share no safe window for thora.  I share that R wrist xray was done, with no acute injury.  It seems though that Beverly [name]'s upper arm/elbow may need imaging.  We also talked at length about Beverly [name]'s appearance and frailty.  We talked at length about pneumonia, the treatment plan including antibiotics, and still and yet, white blood cells are increasing daily.   Beverly [name] shares that Beverly [name] has had no strength due to in the pneumonia.  She talks about a sudden decline.  I share the our worries that if Beverly [name] is not showing improvements after 6 days in the hospital with treatment, that she may not be able to improve.  I share that she could qualify for hospice type care.  Beverly [name] shares that she would bring Beverly [name] home with home health if she is better, or hospice if she is not. 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   Conference with attending, bedside nursing staff, transition of care team related to patient  condition, needs, goals of care, disposition. [place] form/DNR completed.  Plan:   Continue to treat the treatable but no CPR or intubation.  Time for outcomes.  IR was unable to provide thoracentesis.  Considering home with hospice if no improvement.  45 minutes  Beverly Carmel, NP Palliative medicine team Team phone [phone] Greater than 50% of this time was spent counseling and coordinating care for the above assessment and plan

## 2020-05-07 NOTE — Progress Notes (Signed)
PROGRESS NOTE  Beverly [name] MVH:[id number] DOB: 08-06-1932 DOA: 05/01/2020 PCP: Benita Stabile, MD   Brief History:  87 year old female with a history of seizure disorder, hypertension, hyperlipidemia, mild dementia, admitted to the hospital with generalized weakness, fever and cough. Found to have significant right lower lobe pneumonia. CT indicates possible underlying lung abscess versus mass. She is on IV antibiotics. Also noted to have heme positive stools and anemia.  She was transfused one unit PRBC. GI following.  Pulmonary was consulted for her pneumonia vs lung mass-->No rule for bronchoscopy preently.  Assessment/Plan:  Sepsis -likely source pneumonia/UTI -waspresent on admission  -presented with fever, leukocytosis, hypoxia -Per sepsis protocol status post IV fluid resuscitation, on broad-spectrum antibiotics cefepime/Flagyl azithromycin -sepsis physiology now resolved  -PCT 0.82>>0.95  community-acquired pneumonia. -Currently on IV antibiotics >>>antibiotic has been broadened.. to cefepime on 4/30 -vanc added 5/4 -CT chest shows underlying lung abscess versus mass -Pulmonology consulted appreciate follow-up -Strep pneumonia antigen negative, Legionella antigen neg -WBC continues to rise -discussed risks/benefits/alternatives of thoracocentesis--pt agreed-->send for cell count, culture, cytology  Lung lesion-nodule versus versus abscess -Findings on imaging-CT chest abdomen pelvis reviewed: Dense consolidative opacity in the right lower lobe with 3 cm central low-density lesion in the lung parenchyma. This lesion may be related to necrotic pneumonia/pulmonary abscess although neoplasm not excluded -Pulm Dr. Wertconsulted--recommended thoracentesis for fluid analysis to distinguish between infection or neoplasm---after numerous discussions, pt has agreed to [place] -He recommended continue current treatment IV antibiotics  Diarrhea -Noted to  have significant diarrhea over the past 2 weeks. -Currently improved, reporting of 2 stools in past 24 hours -CT abdomen does not indicate any clear-cut colitis -Stool for C. difficile is negative -Use Imodium as needed -continuing lactobacillus -improved  Urinary tract infection -Urine culture positive for E. Coli -This is being covered by current antibiotics   Iron deficiency anemia -Hemoglobin dropped 8.0, 8.1, 6.4>>>7.1 -Pros and cons of blood transfusion discussed with patient and daughter at bedside -She was noted to have heme positive stools -Per patient, she does not have any melena or hematochezia -Continue on PPI -Anemia panel indicates iron deficiency -Seen by GI, appreciate input -She will need EGD/colonoscopy once medically stable -We will consider IV iron infusion once infectious process has stabilized -Overall hemoglobin appears to be stable -Transfuse for hemoglobin less than 7  Seizure disorder -Continue on Keppra -Stable  Hyperlipidemia -Continue on statin  Hypertension -Blood pressure is trending up -Resume Coreg and amlodipine  Anxiety -Continue on home dose of Xanax -Stable  Generalized weakness -Seen by physical therapy with recommendations for skilled nursing facility placement -patient refuses SNF  Goals of Care -palliative consult for GOC discussion -patient currently refuses thoracocentesis       Status is: Inpatient  Remains inpatient appropriate because:IV treatments appropriate due to intensity of illness or inability to take PO   Dispo: The patient is from: Home  Anticipated d/c is to: Home  Patient currently is not medically stable to d/c.              Difficult to place patient No        Family Communication:   Daughter updated at bedside 5/5  Consultants:  Palliative; pulmonary  Code Status:  DNR  DVT Prophylaxis:  SCDs   Procedures: As Listed in  Progress Note Above  Antibiotics: Cefepime 4/30>> Metronidazole 4/30>> Vanco 5/4>>    Subjective: Patient feels weak.  Denies f/c, cp, nv/d, sob, abd  pain  Objective: Vitals:   05/06/20 2022 05/06/20 2024 05/06/20 2149 05/07/20 0500  BP: 137/67   129/60  Pulse: (!) 106 99  100  Resp: 16   18  Temp: 98.3 F (36.8 C)   98 F (36.7 C)  TempSrc: Oral   Oral  SpO2: 91% 95% 95% 98%  Weight:      Height:        Intake/Output Summary (Last 24 hours) at 05/07/2020 1244 Last data filed at 05/07/2020 0500 Gross per 24 hour  Intake 420 ml  Output 1950 ml  Net -1530 ml   Weight change:  Exam:   General:  Pt is alert, follows commands appropriately, not in acute distress  HEENT: No icterus, No thrush, No neck mass, [place]/AT  Cardiovascular: RRR, S1/S2, no rubs, no gallops  Respiratory: diminished BS at bases.  No wheeze  Abdomen: Soft/+BS, non tender, non distended, no guarding  Extremities: No edema, No lymphangitis, No petechiae, No rashes, no synovitis   Data Reviewed: I have personally reviewed following labs and imaging studies Basic Metabolic Panel: Recent Labs  Lab 05/03/20 0455 05/04/20 0608 05/05/20 0514 05/06/20 0446 05/07/20 0559  NA 132* 131* 132* 132* 130*  K 3.4* 2.7* 4.1 4.1 3.7  CL 99 93* 98 96* 94*  CO2 25 27 29 28 26   GLUCOSE 100* 111* 103* 89 118*  BUN 12 7* 11 14 11   CREATININE 0.42* 0.40* 0.41* 0.51 0.42*  CALCIUM 8.2* 8.3* 7.9* 8.3* 8.2*  MG  --  1.1*  --   --   --   PHOS  --  2.1*  --   --   --    Liver Function Tests: Recent Labs  Lab 05/01/20 1140 05/02/20 0603 05/03/20 0455 05/04/20 0608  AST 14* 17 19  --   ALT 10 10 12   --   ALKPHOS 129* 121 109  --   BILITOT 0.6 0.7 0.7  --   PROT 6.5 6.2* 6.1*  --   ALBUMIN 2.1* 1.9* 1.8* 2.0*   No results for input(s): LIPASE, AMYLASE in the last 168 hours. No results for input(s): AMMONIA in the last 168 hours. Coagulation Profile: Recent Labs  Lab 05/01/20 1140 05/02/20 0603  05/04/20 0808  INR 1.1 1.2 1.2   CBC: Recent Labs  Lab 05/01/20 1140 05/01/20 1943 05/02/20 0603 05/04/20 0608 05/04/20 0808 05/05/20 0514 05/05/20 0830 05/05/20 1100 05/05/20 1553 05/06/20 0446 05/07/20 0559  WBC 29.1* 23.0*   < > 16.1* 15.9* 16.4*  --   --   --  19.7* 21.8*  NEUTROABS 25.1* 19.5*  --   --  13.1*  --   --   --   --   --   --   HGB 7.8* 7.0*   < > 8.0* 8.1* 6.4* 8.0* 7.1* 9.9* 9.9* 9.7*  HCT 26.0* 22.8*   < > 25.6* 25.3* 21.1* 26.0* 22.7* 30.6* 31.1* 31.5*  MCV 80.5 79.4*   < > 76.0* 76.2* 77.0*  --   --   --  78.1* 80.6  PLT 522* 505*   < > 608* 597* 510*  --   --   --  578* 401*   < > = values in this interval not displayed.   Cardiac Enzymes: No results for input(s): CKTOTAL, CKMB, CKMBINDEX, TROPONINI in the last 168 hours. BNP: Invalid input(s): POCBNP CBG: No results for input(s): GLUCAP in the last 168 hours. HbA1C: No results for input(s): HGBA1C in the last 72 hours.  Urine analysis:    Component Value Date/Time   COLORURINE YELLOW 05/01/2020 1215   APPEARANCEUR CLEAR 05/01/2020 1215   LABSPEC 1.019 05/01/2020 1215   PHURINE 5.0 05/01/2020 1215   GLUCOSEU NEGATIVE 05/01/2020 1215   HGBUR NEGATIVE 05/01/2020 1215   BILIRUBINUR NEGATIVE 05/01/2020 1215   KETONESUR NEGATIVE 05/01/2020 1215   PROTEINUR 30 (A) 05/01/2020 1215   UROBILINOGEN 0.2 10/18/2012 1944   NITRITE NEGATIVE 05/01/2020 1215   LEUKOCYTESUR MODERATE (A) 05/01/2020 1215   Sepsis Labs: @LABRCNTIP (procalcitonin:4,lacticidven:4) ) Recent Results (from the past 240 hour(s))  Blood Culture (routine x 2)     Status: None   Collection Time: 05/01/20 11:41 AM   Specimen: BLOOD  Result Value Ref Range Status   Specimen Description BLOOD RIGHT ARM  Final   Special Requests   Final    BOTTLES DRAWN AEROBIC AND ANAEROBIC Blood Culture adequate volume   Culture   Final    NO GROWTH 5 DAYS Performed at [facility], [address] [place], [place] [address]    Report Status  05/06/2020 FINAL  Final  Blood Culture (routine x 2)     Status: Abnormal   Collection Time: 05/01/20 11:42 AM   Specimen: BLOOD  Result Value Ref Range Status   Specimen Description   Final    BLOOD LEFT ARM Performed at [facility], [address] [place], [place] [address]    Special Requests   Final    BOTTLES DRAWN AEROBIC AND ANAEROBIC Blood Culture adequate volume Performed at [facility], [address] [place], [place] [address]    Culture  Setup Time   Final    GRAM POSITIVE COCCI ANAEROBIC BOTTLE Gram Stain Report Called to,Read Back By and Verified With: HOWARDSON,M@1416  BY MATTHEWS, B 5.1.22 CRITICAL RESULT CALLED TO, READ BACK BY AND VERIFIED WITH: N ONDARA RN @2155  05/03/20 AH    Culture (A)  Final    STAPHYLOCOCCUS EPIDERMIDIS THE SIGNIFICANCE OF ISOLATING THIS ORGANISM FROM A SINGLE SET OF BLOOD CULTURES WHEN MULTIPLE SETS ARE DRAWN IS UNCERTAIN. PLEASE NOTIFY THE MICROBIOLOGY DEPARTMENT WITHIN ONE WEEK IF SPECIATION AND SENSITIVITIES ARE REQUIRED. Performed at [facility], [address] [place], [place] [address]    Report Status 05/06/2020 FINAL  Final  Blood Culture ID Panel (Reflexed)     Status: Abnormal   Collection Time: 05/01/20 11:42 AM  Result Value Ref Range Status   Enterococcus faecalis NOT DETECTED NOT DETECTED Final   Enterococcus Faecium NOT DETECTED NOT DETECTED Final   Listeria monocytogenes NOT DETECTED NOT DETECTED Final   Staphylococcus species DETECTED (A) NOT DETECTED Final    Comment: CRITICAL RESULT CALLED TO, READ BACK BY AND VERIFIED WITH: NACKSON ONDARA,RN 05/03/2020 AT 2155 A.HUGHES    Staphylococcus aureus (BCID) NOT DETECTED NOT DETECTED Final   Staphylococcus epidermidis DETECTED (A) NOT DETECTED Final    Comment: Methicillin (oxacillin) resistant coagulase negative staphylococcus. Possible blood culture contaminant (unless isolated from more than one blood culture draw or clinical case suggests pathogenicity). No  antibiotic treatment is indicated for blood  culture contaminants. CRITICAL RESULT CALLED TO, READ BACK BY AND VERIFIED WITH: NACKSON ONDARA,RN 05/03/2020 AT 2155 A.HUGHES    Staphylococcus lugdunensis NOT DETECTED NOT DETECTED Final   Streptococcus species NOT DETECTED NOT DETECTED Final   Streptococcus agalactiae NOT DETECTED NOT DETECTED Final   Streptococcus pneumoniae NOT DETECTED NOT DETECTED Final   Streptococcus pyogenes NOT DETECTED NOT DETECTED Final   A.calcoaceticus-baumannii NOT DETECTED NOT DETECTED Final   Bacteroides  fragilis NOT DETECTED NOT DETECTED Final   Enterobacterales NOT DETECTED NOT DETECTED Final   Enterobacter cloacae complex NOT DETECTED NOT DETECTED Final   Escherichia coli NOT DETECTED NOT DETECTED Final   Klebsiella aerogenes NOT DETECTED NOT DETECTED Final   Klebsiella oxytoca NOT DETECTED NOT DETECTED Final   Klebsiella pneumoniae NOT DETECTED NOT DETECTED Final   Proteus species NOT DETECTED NOT DETECTED Final   Salmonella species NOT DETECTED NOT DETECTED Final   Serratia marcescens NOT DETECTED NOT DETECTED Final   Haemophilus influenzae NOT DETECTED NOT DETECTED Final   Neisseria meningitidis NOT DETECTED NOT DETECTED Final   Pseudomonas aeruginosa NOT DETECTED NOT DETECTED Final   Stenotrophomonas maltophilia NOT DETECTED NOT DETECTED Final   Candida albicans NOT DETECTED NOT DETECTED Final   Candida auris NOT DETECTED NOT DETECTED Final   Candida glabrata NOT DETECTED NOT DETECTED Final   Candida krusei NOT DETECTED NOT DETECTED Final   Candida parapsilosis NOT DETECTED NOT DETECTED Final   Candida tropicalis NOT DETECTED NOT DETECTED Final   Cryptococcus neoformans/gattii NOT DETECTED NOT DETECTED Final   Methicillin resistance mecA/C DETECTED (A) NOT DETECTED Final    Comment: CRITICAL RESULT CALLED TO, READ BACK BY AND VERIFIED WITH: NACKSON ONDARA,RN 05/03/2020 AT 2155 A.HUGHES Performed at [facility], [address]  [place], [place] [address]   Urine culture     Status: Abnormal   Collection Time: 05/01/20 12:15 PM   Specimen: Urine, Clean Catch  Result Value Ref Range Status   Specimen Description   Final    URINE, CLEAN CATCH Performed at [facility], [address], [place], [place] [address]    Special Requests   Final    NONE Performed at [facility], [address], [place], [place] [address]    Culture >=100,000 COLONIES/mL ESCHERICHIA COLI (A)  Final   Report Status 05/04/2020 FINAL  Final   Organism ID, Bacteria ESCHERICHIA COLI (A)  Final      Susceptibility   Escherichia coli - MIC*    AMPICILLIN >=32 RESISTANT Resistant     CEFAZOLIN <=4 SENSITIVE Sensitive     CEFEPIME <=0.12 SENSITIVE Sensitive     CEFTRIAXONE <=0.25 SENSITIVE Sensitive     CIPROFLOXACIN <=0.25 SENSITIVE Sensitive     GENTAMICIN >=16 RESISTANT Resistant     IMIPENEM <=0.25 SENSITIVE Sensitive     NITROFURANTOIN <=16 SENSITIVE Sensitive     TRIMETH/SULFA >=320 RESISTANT Resistant     AMPICILLIN/SULBACTAM >=32 RESISTANT Resistant     PIP/TAZO <=4 SENSITIVE Sensitive     * >=100,000 COLONIES/mL ESCHERICHIA COLI  Resp Panel by RT-PCR (Flu A&B, Covid) Nasopharyngeal Swab     Status: None   Collection Time: 05/01/20 12:15 PM   Specimen: Nasopharyngeal Swab; Nasopharyngeal(NP) swabs in vial transport medium  Result Value Ref Range Status   SARS Coronavirus 2 by RT PCR NEGATIVE NEGATIVE Final    Comment: (NOTE) SARS-CoV-2 target nucleic acids are NOT DETECTED.  The SARS-CoV-2 RNA is generally detectable in upper respiratory specimens during the acute phase of infection. The lowest concentration of SARS-CoV-2 viral copies this assay can detect is 138 copies/mL. A negative result does not preclude SARS-Cov-2 infection and should not be used as the sole basis for treatment or other patient management decisions. A negative result may occur with  improper specimen collection/handling, submission of specimen other than  nasopharyngeal swab, presence of viral mutation(s) within the areas targeted by this assay, and inadequate number of viral copies(<138 copies/mL). A negative result  must be combined with clinical observations, patient history, and epidemiological information. The expected result is Negative.  Fact Sheet for Patients:  BloggerCourse.com  Fact Sheet for Healthcare Providers:  SeriousBroker.it  This test is no t yet approved or cleared by the [place] FDA and  has been authorized for detection and/or diagnosis of SARS-CoV-2 by FDA under an Emergency Use Authorization (EUA). This EUA will remain  in effect (meaning this test can be used) for the duration of the COVID-19 declaration under Section 564(b)(1) of the Act, 21 U.S.C.section 360bbb-3(b)(1), unless the authorization is terminated  or revoked sooner.       Influenza A by PCR NEGATIVE NEGATIVE Final   Influenza B by PCR NEGATIVE NEGATIVE Final    Comment: (NOTE) The Xpert Xpress SARS-CoV-2/FLU/RSV plus assay is intended as an aid in the diagnosis of influenza from Nasopharyngeal swab specimens and should not be used as a sole basis for treatment. Nasal washings and aspirates are unacceptable for Xpert Xpress SARS-CoV-2/FLU/RSV testing.  Fact Sheet for Patients: BloggerCourse.com  Fact Sheet for Healthcare Providers: SeriousBroker.it  This test is not yet approved or cleared by the [place] FDA and has been authorized for detection and/or diagnosis of SARS-CoV-2 by FDA under an Emergency Use Authorization (EUA). This EUA will remain in effect (meaning this test can be used) for the duration of the COVID-19 declaration under Section 564(b)(1) of the Act, 21 U.S.C. section 360bbb-3(b)(1), unless the authorization is terminated or revoked.  Performed at [facility], [address], [place], [place] [address]   C  Difficile Quick Screen w PCR reflex     Status: None   Collection Time: 05/02/20  3:39 PM   Specimen: STOOL  Result Value Ref Range Status   C Diff antigen NEGATIVE NEGATIVE Final   C Diff toxin NEGATIVE NEGATIVE Final   C Diff interpretation No C. difficile detected.  Final    Comment: Performed at [facility], [address], [place], [place] [address]  Culture, blood (x 2)     Status: None (Preliminary result)   Collection Time: 05/04/20  8:07 AM   Specimen: BLOOD  Result Value Ref Range Status   Specimen Description BLOOD LEFT ANTECUBITAL  Final   Special Requests   Final    BOTTLES DRAWN AEROBIC AND ANAEROBIC Blood Culture adequate volume   Culture   Final    NO GROWTH 2 DAYS Performed at [facility], [address], [place], [place] [address]    Report Status PENDING  Incomplete  Culture, blood (x 2)     Status: None (Preliminary result)   Collection Time: 05/04/20  8:09 AM   Specimen: BLOOD  Result Value Ref Range Status   Specimen Description BLOOD BLOOD LEFT HAND  Final   Special Requests   Final    BOTTLES DRAWN AEROBIC AND ANAEROBIC Blood Culture adequate volume   Culture   Final    NO GROWTH 2 DAYS Performed at [facility], [address], [place], [place] [address]    Report Status PENDING  Incomplete  MRSA PCR Screening     Status: Abnormal   Collection Time: 05/06/20 12:47 PM   Specimen: Nasal Mucosa; Nasopharyngeal  Result Value Ref Range Status   MRSA by PCR POSITIVE (A) NEGATIVE Final    Comment:        The GeneXpert MRSA Assay (FDA approved for NASAL specimens only), is one component of a comprehensive MRSA colonization surveillance program. It is not intended to diagnose MRSA  infection nor to guide or monitor treatment for MRSA infections. RESULT CALLED TO, READ BACK BY AND VERIFIED WITH: DILDY,VAL RN  05/06/20 BY JONES,T Performed at [facility], [address], [place], [place] [address]      Scheduled Meds: . amLODipine  10 mg Oral Daily   . carvedilol  6.25 mg Oral BID WC  . Chlorhexidine Gluconate Cloth  6 each Topical Q0600  . diphenhydrAMINE  25 mg Oral Once  . furosemide  20 mg Intravenous Once  . guaiFENesin  600 mg Oral BID  . lactobacillus  1 g Oral TID WC  . latanoprost  1 drop Both Eyes QHS  . levETIRAcetam  500 mg Oral BID  . mupirocin ointment  1 application Nasal BID  . pantoprazole (PROTONIX) IV  40 mg Intravenous Q12H  . risperiDONE  0.5 mg Oral BID   Continuous Infusions: . ceFEPime (MAXIPIME) IV 2 g (05/07/20 1012)  . metronidazole 500 mg (05/07/20 0456)  . vancomycin 1,250 mg (05/07/20 1206)   Followed by  . [START ON 05/08/2020] vancomycin      Procedures/Studies: DG Wrist 2 Views Right  Result Date: 05/07/2020 CLINICAL DATA:  Right wrist pain, no known injury, initial encounter EXAM: RIGHT WRIST - 2 VIEW COMPARISON:  None. FINDINGS: Degenerative changes of the first [facility] joint are noted. No acute fracture or dislocation is noted. Mild osteopenia is seen. Some soft tissue swelling about the wrist is noted. IMPRESSION: Mild degenerative changes without acute abnormality. Electronically Signed   By: Alcide Clever M.D.   On: 05/07/2020 11:19   CT CHEST ABDOMEN PELVIS W CONTRAST  Result Date: 05/02/2020 CLINICAL DATA:  Sepsis.  Community-acquired pneumonia. EXAM: CT CHEST, ABDOMEN, AND PELVIS WITH CONTRAST TECHNIQUE: Multidetector CT imaging of the chest, abdomen and pelvis was performed following the standard protocol during bolus administration of intravenous contrast. CONTRAST:  OMNIPAQUE IOHEXOL 300 MG/ML  SOLN COMPARISON:  No comparison studies available. FINDINGS: CT CHEST FINDINGS Cardiovascular: The heart size is normal. No substantial pericardial effusion. Coronary artery calcification is evident. Mitral annular calcification noted. Atherosclerotic calcification is noted in the wall of the thoracic aorta. Mediastinum/Nodes: Mediastinal lymphadenopathy evident. 15 mm short axis precarinal node shows  central necrosis. 2.3 cm short axis subcarinal lymph node evident. No left hilar lymphadenopathy. Soft tissue fullness noted in the right hilum. Moderate to large hiatal hernia. There is no axillary lymphadenopathy. Lungs/Pleura: Dense consolidative opacity noted in the right lower lobe with 3 cm central low-density lesion in the lung parenchyma. This is associated with mild left base collapse/consolidation, moderate right pleural effusion and small left pleural effusion. Patchy areas of peripheral airspace disease are noted in the left upper lobe. Musculoskeletal: Bones are diffusely demineralized age-indeterminate nondisplaced fractures of the anterior right third and fourth ribs noted. Degenerative changes are evident in both shoulders, right greater than left. CT ABDOMEN PELVIS FINDINGS Hepatobiliary: No suspicious focal abnormality within the liver parenchyma. Gallbladder is distended. No intrahepatic or extrahepatic biliary dilation. Pancreas: No focal mass lesion. No dilatation of the main duct. No intraparenchymal cyst. No peripancreatic edema. Spleen: No splenomegaly. No focal mass lesion. Adrenals/Urinary Tract: No adrenal nodule or mass. Kidneys unremarkable. No evidence for hydroureter. The urinary bladder appears normal for the degree of distention. Stomach/Bowel: Moderate to large hiatal hernia Duodenum is normally positioned as is the ligament of Treitz. No small bowel wall thickening. No small bowel dilatation. The terminal ileum is normal. The appendix is not well visualized, but there is no edema or  inflammation in the region of the cecum. Mild wall thickening noted in the sigmoid colon, potentially related to underdistention although infectious/inflammatory colitis not excluded. Vascular/Lymphatic: There is abdominal aortic atherosclerosis without aneurysm. There is no gastrohepatic or hepatoduodenal ligament lymphadenopathy. No retroperitoneal or mesenteric lymphadenopathy. No pelvic sidewall  lymphadenopathy. Reproductive: Uterus surgically absent.  There is no adnexal mass. Other: Trace free fluid seen in the pelvis and in the left paracolic gutter. Musculoskeletal: No worrisome lytic or sclerotic osseous abnormality. Advanced degenerative disc disease noted lumbar spine. IMPRESSION: 1. Dense consolidative opacity in the right lower lobe with 3 cm central low-density lesion in the lung parenchyma. This lesion may be related to necrotic pneumonia/pulmonary abscess although neoplasm not excluded. 2. Mediastinal and right hilar lymphadenopathy is. Subcarinal lymph node is somewhat more bulky than typically seen for reactive etiology and metastatic disease is a concern. 3. Patchy areas of peripheral airspace disease in the left upper lobe, likely infectious/inflammatory. 4. Moderate to large hiatal hernia. 5. Mild wall thickening in the sigmoid colon, potentially related to underdistention although infectious/inflammatory colitis not excluded. 6. Trace free fluid in the pelvis and left paracolic gutter. 7. Aortic Atherosclerosis (ICD10-I70.0). Electronically Signed   By: Kennith Center M.D.   On: 05/02/2020 14:21   DG Chest Port 1 View  Result Date: 05/01/2020 CLINICAL DATA:  Sepsis. EXAM: PORTABLE CHEST 1 VIEW COMPARISON:  January 10, 2019. FINDINGS: Stable cardiomediastinal silhouette. Stable hiatal hernia. No pneumothorax is noted. Left lung is clear. New large rounded opacity is noted in right lower lobe which may represent pneumonia, but neoplasm cannot be excluded. Mild right basilar subsegmental atelectasis and right pleural effusion is noted as well. Bony thorax is unremarkable. IMPRESSION: New large ill-defined rounded opacity is noted in right lower lobe which may represent pneumonia, but neoplasm cannot be excluded. CT scan of the chest with intravenous contrast is recommended for further evaluation. Aortic Atherosclerosis (ICD10-I70.0). Electronically Signed   By: Lupita Raider M.D.   On:  05/01/2020 12:56   DG HIPS BILAT WITH PELVIS MIN 5 VIEWS  Result Date: 05/01/2020 CLINICAL DATA:  Left hip pain after possible fall. EXAM: DG HIP (WITH OR WITHOUT PELVIS) 5+V BILAT COMPARISON:  None. FINDINGS: There is no evidence of hip fracture or dislocation. Moderate narrowing of the right hip joint is noted. IMPRESSION: Moderate osteoarthritis of the right hip. No acute abnormality is noted in either hip. Electronically Signed   By: Lupita Raider M.D.   On: 05/01/2020 12:59    Catarina Hartshorn, DO  [facility]  If 7PM-7AM, please contact night-coverage [email or web address] Password [email or web address] 05/07/2020, 12:44 PM   LOS: 5 days

## 2020-05-07 NOTE — Progress Notes (Signed)
Pharmacy Antibiotic Note  Beverly [name] is a 87 y.o. female admitted on 05/01/2020 with pneumonia.  Pharmacy has been consulted for cefepime and vancomycin dosing. PCT 0.95, WBC increased, afebrile. CT chest shows underlying lung abscess versus mass. Plan for thoracenteses for fluid analysis.   Plan: Vancomycin 1250mg  IV loading dose, then 750mg  IV q24h  VT 15-6mcg/ml Continue cefepime 2gm iv q12h   Monitor labs, c/s, and levels as indicated  Height: 4\' 10"  (147.3 cm) Weight: 57.1 kg (125 lb 12.8 oz) IBW/kg (Calculated) : 40.9  Temp (24hrs), Avg:98 F (36.7 C), Min:97.5 F (36.4 C), Max:98.3 F (36.8 C)  Recent Labs  Lab 05/01/20 1140 05/01/20 1943 05/03/20 0455 05/04/20 05/03/20 05/04/20 0808 05/04/20 0809 05/05/20 0514 05/06/20 0446 05/07/20 0559  WBC 29.1*   < > 15.3* 16.1* 15.9*  --  16.4* 19.7* 21.8*  CREATININE 0.63   < > 0.42* 0.40*  --   --  0.41* 0.51 0.42*  LATICACIDVEN 1.1  --   --   --   --  1.1  --   --   --    < > = values in this interval not displayed.    Estimated Creatinine Clearance: 38.5 mL/min (A) (by C-G formula based on SCr of 0.42 mg/dL (L)).    Allergies  Allergen Reactions  . Lisinopril Swelling  . Eggs Or Egg-Derived Products Itching    Patient reports that she can eat eggs but does not tolerate egg derived products  . Morphine And Related     Headache  . Penicillins Itching    DID THE REACTION INVOLVE: Swelling of the face/tongue/throat, SOB, or low BP? No Sudden or severe rash/hives, skin peeling, or the inside of the mouth or nose? No Did it require medical treatment? No When did it last happen? Unknown If all above answers are "NO", may proceed with cephalosporin use.     Antimicrobials this admission: 4/30 cefepime >>  5/4 Vancomycin >> 4/30 azithromycin >> 5/3 4/30 metronidazole >>  Microbiology results: 4/30 CDiff: negative  5/4 MRSA PCR is positive 4/30 BCx: gram + cocci 1 bottle  BCID- staph epi- contaminant . 5/2 BCX:  ngtd 4/29 UCX: E.COli >100K CFU , s- cefazolin , cefepime, ceftriaxone, cipro  R: septra, amp, unasyn, gent 4/30 Resp Panel: negative   Thank you for allowing pharmacy to be a part of this patient's care.  7/2, BS Pharm D, 5/29 Clinical Pharmacist Pager [phone] 05/07/2020 9:01 AM

## 2020-05-07 NOTE — Progress Notes (Signed)
Subjective:  Appears frail. Daughter at bedside. Patient strangled trying to take her medication. Reports that does not happen often. Denies abdominal pain. Neck and arm pain today. No BM since yesterday. Denies N/V. No melena, brbpr. Patient interested in being discharged and do outpatient work up for GI issues, but family desires inpatient work up prior to discharge.   Objective: Vital signs in last 24 hours: Temp:  [97.5 F (36.4 C)-98.3 F (36.8 C)] 98 F (36.7 C) (05/05 0500) Pulse Rate:  [86-106] 100 (05/05 0500) Resp:  [16-18] 18 (05/05 0500) BP: (129-146)/(60-75) 129/60 (05/05 0500) SpO2:  [91 %-100 %] 98 % (05/05 0500) Last BM Date: 05/06/20 General:   Alert,  Frail appearing. pleasant and cooperative in NAD Head:  Normocephalic and atraumatic. Eyes:  Sclera clear, no icterus.  Abdomen:  Soft, nontender and nondistended. Normal bowel sounds, without guarding, and without rebound.   Extremities:  Without clubbing, deformity or edema. Neurologic:  Alert and  oriented x4;  grossly normal neurologically. Psych:  Alert and cooperative. Normal mood and affect.  Intake/Output from previous day: 05/04 0701 - 05/05 0700 In: 660 [P.O.:360; IV Piggyback:300] Out: 1950 [Urine:1950] Intake/Output this shift: No intake/output data recorded.  Lab Results: CBC Recent Labs    05/05/20 0514 05/05/20 0830 05/05/20 1553 05/06/20 0446 05/07/20 0559  WBC 16.4*  --   --  19.7* 21.8*  HGB 6.4*   < > 9.9* 9.9* 9.7*  HCT 21.1*   < > 30.6* 31.1* 31.5*  MCV 77.0*  --   --  78.1* 80.6  PLT 510*  --   --  578* 401*   < > = values in this interval not displayed.   BMET Recent Labs    05/05/20 0514 05/06/20 0446 05/07/20 0559  NA 132* 132* 130*  K 4.1 4.1 3.7  CL 98 96* 94*  CO2 29 28 26   GLUCOSE 103* 89 118*  BUN 11 14 11   CREATININE 0.41* 0.51 0.42*  CALCIUM 7.9* 8.3* 8.2*   LFTs No results for input(s): BILITOT, BILIDIR, IBILI, ALKPHOS, AST, ALT, PROT, ALBUMIN in the last 72  hours. No results for input(s): LIPASE in the last 72 hours. PT/INR No results for input(s): LABPROT, INR in the last 72 hours.    Imaging Studies: CT CHEST ABDOMEN PELVIS W CONTRAST  Result Date: 05/02/2020 CLINICAL DATA:  Sepsis.  Community-acquired pneumonia. EXAM: CT CHEST, ABDOMEN, AND PELVIS WITH CONTRAST TECHNIQUE: Multidetector CT imaging of the chest, abdomen and pelvis was performed following the standard protocol during bolus administration of intravenous contrast. CONTRAST:  OMNIPAQUE IOHEXOL 300 MG/ML  SOLN COMPARISON:  No comparison studies available. FINDINGS: CT CHEST FINDINGS Cardiovascular: The heart size is normal. No substantial pericardial effusion. Coronary artery calcification is evident. Mitral annular calcification noted. Atherosclerotic calcification is noted in the wall of the thoracic aorta. Mediastinum/Nodes: Mediastinal lymphadenopathy evident. 15 mm short axis precarinal node shows central necrosis. 2.3 cm short axis subcarinal lymph node evident. No left hilar lymphadenopathy. Soft tissue fullness noted in the right hilum. Moderate to large hiatal hernia. There is no axillary lymphadenopathy. Lungs/Pleura: Dense consolidative opacity noted in the right lower lobe with 3 cm central low-density lesion in the lung parenchyma. This is associated with mild left base collapse/consolidation, moderate right pleural effusion and small left pleural effusion. Patchy areas of peripheral airspace disease are noted in the left upper lobe. Musculoskeletal: Bones are diffusely demineralized age-indeterminate nondisplaced fractures of the anterior right third and fourth ribs noted. Degenerative changes are  evident in both shoulders, right greater than left. CT ABDOMEN PELVIS FINDINGS Hepatobiliary: No suspicious focal abnormality within the liver parenchyma. Gallbladder is distended. No intrahepatic or extrahepatic biliary dilation. Pancreas: No focal mass lesion. No dilatation of the  main duct. No intraparenchymal cyst. No peripancreatic edema. Spleen: No splenomegaly. No focal mass lesion. Adrenals/Urinary Tract: No adrenal nodule or mass. Kidneys unremarkable. No evidence for hydroureter. The urinary bladder appears normal for the degree of distention. Stomach/Bowel: Moderate to large hiatal hernia Duodenum is normally positioned as is the ligament of Treitz. No small bowel wall thickening. No small bowel dilatation. The terminal ileum is normal. The appendix is not well visualized, but there is no edema or inflammation in the region of the cecum. Mild wall thickening noted in the sigmoid colon, potentially related to underdistention although infectious/inflammatory colitis not excluded. Vascular/Lymphatic: There is abdominal aortic atherosclerosis without aneurysm. There is no gastrohepatic or hepatoduodenal ligament lymphadenopathy. No retroperitoneal or mesenteric lymphadenopathy. No pelvic sidewall lymphadenopathy. Reproductive: Uterus surgically absent.  There is no adnexal mass. Other: Trace free fluid seen in the pelvis and in the left paracolic gutter. Musculoskeletal: No worrisome lytic or sclerotic osseous abnormality. Advanced degenerative disc disease noted lumbar spine. IMPRESSION: 1. Dense consolidative opacity in the right lower lobe with 3 cm central low-density lesion in the lung parenchyma. This lesion may be related to necrotic pneumonia/pulmonary abscess although neoplasm not excluded. 2. Mediastinal and right hilar lymphadenopathy is. Subcarinal lymph node is somewhat more bulky than typically seen for reactive etiology and metastatic disease is a concern. 3. Patchy areas of peripheral airspace disease in the left upper lobe, likely infectious/inflammatory. 4. Moderate to large hiatal hernia. 5. Mild wall thickening in the sigmoid colon, potentially related to underdistention although infectious/inflammatory colitis not excluded. 6. Trace free fluid in the pelvis and left  paracolic gutter. 7. Aortic Atherosclerosis (ICD10-I70.0). Electronically Signed   By: Kennith Center M.D.   On: 05/02/2020 14:21   DG Chest Port 1 View  Result Date: 05/01/2020 CLINICAL DATA:  Sepsis. EXAM: PORTABLE CHEST 1 VIEW COMPARISON:  January 10, 2019. FINDINGS: Stable cardiomediastinal silhouette. Stable hiatal hernia. No pneumothorax is noted. Left lung is clear. New large rounded opacity is noted in right lower lobe which may represent pneumonia, but neoplasm cannot be excluded. Mild right basilar subsegmental atelectasis and right pleural effusion is noted as well. Bony thorax is unremarkable. IMPRESSION: New large ill-defined rounded opacity is noted in right lower lobe which may represent pneumonia, but neoplasm cannot be excluded. CT scan of the chest with intravenous contrast is recommended for further evaluation. Aortic Atherosclerosis (ICD10-I70.0). Electronically Signed   By: Lupita Raider M.D.   On: 05/01/2020 12:56   DG HIPS BILAT WITH PELVIS MIN 5 VIEWS  Result Date: 05/01/2020 CLINICAL DATA:  Left hip pain after possible fall. EXAM: DG HIP (WITH OR WITHOUT PELVIS) 5+V BILAT COMPARISON:  None. FINDINGS: There is no evidence of hip fracture or dislocation. Moderate narrowing of the right hip joint is noted. IMPRESSION: Moderate osteoarthritis of the right hip. No acute abnormality is noted in either hip. Electronically Signed   By: Lupita Raider M.D.   On: 05/01/2020 12:59  [2 weeks]   Assessment: 87 year old female admitted with sepsis secondary to community-acquired pneumonia and chest CT showing underlying lung abscess versus mass, UTI, diarrhea for the past 2 weeks, anemia with heme positive stool.  Thoracentesis ordered but patient declined.  Pulmonology on board.  Diarrhea: Resolved.  Mild wall thickening noted  in the sigmoid colon, potentially related to underdistention versus infectious/inflammatory colitis.  C. difficile quick screen negative.  Anemia with heme  positive stool: Hemoglobin 7.8 on admission, down from 11.4 in January 2021.  Ferritin 70, iron less than 5, saturation unable to calculate.  Drop in hemoglobin yesterday to 6.4.  Received 1 unit of packed red blood cells with improvement to hemoglobin of 9.9.  Some concern for small amount of melena 2 days ago.  Family desires endoscopic procedures prior to discharge if clinically appropriate.  Last colonoscopy in the remote past, no prior EGD.  Leukocytosis: mild bump in WBC today. Remains on antibiotic therapy.   Plan: 1. Continue PPI twice daily. 2. Monitor hemoglobin, transfuse as needed. 3. Monitor for overt GI bleeding. 4. We will continue to reassess/follow for candidacy for endoscopic procedures.  She remains frail today.  Discussed with daughter and patient, would hold off on consideration of bowel preparation at this time.  We will reassess in the morning.  Leanna Battles. [facility] [phone] 5/5/20229:36 AM     LOS: 5 days

## 2020-05-08 ENCOUNTER — Inpatient Hospital Stay (HOSPITAL_COMMUNITY): Payer: Medicare Other

## 2020-05-08 DIAGNOSIS — R6520 Severe sepsis without septic shock: Secondary | ICD-10-CM | POA: Diagnosis not present

## 2020-05-08 DIAGNOSIS — N390 Urinary tract infection, site not specified: Secondary | ICD-10-CM

## 2020-05-08 DIAGNOSIS — N179 Acute kidney failure, unspecified: Secondary | ICD-10-CM | POA: Diagnosis not present

## 2020-05-08 DIAGNOSIS — J189 Pneumonia, unspecified organism: Secondary | ICD-10-CM | POA: Diagnosis not present

## 2020-05-08 DIAGNOSIS — R195 Other fecal abnormalities: Secondary | ICD-10-CM | POA: Diagnosis not present

## 2020-05-08 DIAGNOSIS — A419 Sepsis, unspecified organism: Secondary | ICD-10-CM | POA: Diagnosis not present

## 2020-05-08 LAB — CBC
HCT: 30.2 % — ABNORMAL LOW (ref 36.0–46.0)
Hemoglobin: 9.5 g/dL — ABNORMAL LOW (ref 12.0–15.0)
MCH: 25.1 pg — ABNORMAL LOW (ref 26.0–34.0)
MCHC: 31.5 g/dL (ref 30.0–36.0)
MCV: 79.7 fL — ABNORMAL LOW (ref 80.0–100.0)
Platelets: 502 10*3/uL — ABNORMAL HIGH (ref 150–400)
RBC: 3.79 MIL/uL — ABNORMAL LOW (ref 3.87–5.11)
RDW: 20.5 % — ABNORMAL HIGH (ref 11.5–15.5)
WBC: 22.4 10*3/uL — ABNORMAL HIGH (ref 4.0–10.5)
nRBC: 0 % (ref 0.0–0.2)

## 2020-05-08 LAB — BASIC METABOLIC PANEL
Anion gap: 7 (ref 5–15)
BUN: 11 mg/dL (ref 8–23)
CO2: 27 mmol/L (ref 22–32)
Calcium: 8 mg/dL — ABNORMAL LOW (ref 8.9–10.3)
Chloride: 97 mmol/L — ABNORMAL LOW (ref 98–111)
Creatinine, Ser: 0.43 mg/dL — ABNORMAL LOW (ref 0.44–1.00)
GFR, Estimated: >60 mL/min (ref >60)
Glucose, Bld: 128 mg/dL — ABNORMAL HIGH (ref 70–99)
Potassium: 3 mmol/L — ABNORMAL LOW (ref 3.5–5.1)
Sodium: 131 mmol/L — ABNORMAL LOW (ref 135–145)

## 2020-05-08 LAB — URIC ACID: Uric Acid, Serum: 1.4 mg/dL — ABNORMAL LOW (ref 2.5–7.1)

## 2020-05-08 LAB — MAGNESIUM: Magnesium: 1.2 mg/dL — ABNORMAL LOW (ref 1.7–2.4)

## 2020-05-08 MED ORDER — MAGNESIUM SULFATE 2 GM/50ML IV SOLN
2.0000 g | Freq: Once | INTRAVENOUS | Status: AC
  Administered 2020-05-08: 2 g via INTRAVENOUS
  Filled 2020-05-08: qty 50

## 2020-05-08 MED ORDER — POTASSIUM CHLORIDE CRYS ER 20 MEQ PO TBCR
40.0000 meq | EXTENDED_RELEASE_TABLET | Freq: Two times a day (BID) | ORAL | Status: DC
  Administered 2020-05-08 – 2020-05-09 (×2): 40 meq via ORAL
  Filled 2020-05-08 (×3): qty 2

## 2020-05-08 MED ORDER — NYSTATIN 100000 UNIT/ML MT SUSP
5.0000 mL | Freq: Four times a day (QID) | OROMUCOSAL | Status: DC
  Administered 2020-05-08 – 2020-05-09 (×4): 500000 [IU] via ORAL
  Filled 2020-05-08 (×4): qty 5

## 2020-05-08 NOTE — Care Management Important Message (Signed)
Important Message  Patient Details  Name: Beverly [name] MRN: [id number] Date of Birth: 19-Jan-1932   Medicare Important Message Given:  Yes     Corey Harold 05/08/2020, 11:29 AM

## 2020-05-08 NOTE — Progress Notes (Addendum)
Patient was sleeping when in the room.  Parents at bedside.  We had a long discussion about the patient's clinical course and prognosis of her pneumonia.  She states that they are not sure that the patient will do very well.  She is not having active bleeding and her hemoglobin is now stable.  Because of this the family has decided to hold off on any inpatient anoscopic evaluation.  Pending her clinical course, we discussed possibility of reaching out as an outpatient to have an office follow-up and possible reevaluation of her anemia at that time.   We will sign off for now, will remove consult order. Please contact us and enter a new consult order if we can be of further assistance.   Thank you for allowing [place] to participate in the care of Beverly Gunner, DNP, AGNP-C Adult & Gerontological Nurse Practitioner [facility]

## 2020-05-08 NOTE — Plan of Care (Signed)
  Problem: Education: Goal: Knowledge of General Education information will improve Description: Including pain rating scale, medication(s)/side effects and non-pharmacologic comfort measures Outcome: Progressing   Problem: Health Behavior/Discharge Planning: Goal: Ability to manage health-related needs will improve Outcome: Progressing   Problem: Clinical Measurements: Goal: Ability to maintain clinical measurements within normal limits will improve Outcome: Progressing Goal: Will remain free from infection Outcome: Progressing Goal: Diagnostic test results will improve Outcome: Progressing Goal: Respiratory complications will improve Outcome: Progressing Goal: Cardiovascular complication will be avoided Outcome: Progressing   Problem: Activity: Goal: Risk for activity intolerance will decrease Outcome: Progressing   Problem: Nutrition: Goal: Adequate nutrition will be maintained Outcome: Progressing   Problem: Coping: Goal: Level of anxiety will decrease Outcome: Progressing   Problem: Elimination: Goal: Will not experience complications related to bowel motility Outcome: Progressing Goal: Will not experience complications related to urinary retention Outcome: Progressing   Problem: Pain Managment: Goal: General experience of comfort will improve Outcome: Progressing   Problem: Safety: Goal: Ability to remain free from injury will improve Outcome: Progressing   Problem: Skin Integrity: Goal: Risk for impaired skin integrity will decrease Outcome: Progressing   Problem: Activity: Goal: Ability to tolerate increased activity will improve Outcome: Progressing   Problem: Clinical Measurements: Goal: Ability to maintain a body temperature in the normal range will improve Outcome: Progressing   Problem: Respiratory: Goal: Ability to maintain adequate ventilation will improve Outcome: Progressing Goal: Ability to maintain a clear airway will improve Outcome:  Progressing   Problem: Urinary Elimination: Goal: Signs and symptoms of infection will decrease Outcome: Progressing   

## 2020-05-08 NOTE — Progress Notes (Signed)
NAME:  Beverly [name], MRN:  [id number], DOB:  03/12/1932, LOS: 6 ADMISSION DATE:  05/01/2020, CONSULTATION DATE:  05/04/20 REFERRING MD: [facility]  CHIEF COMPLAINT:  Weak/fever   History of Present Illness:  52 yowf never smoker with medical history significant of seizure disorder, hypertension, hyperlipidemia, dementia, is brought to the hospital for generalized weakness.  Patient's granddaughter is at bedside and provides history.  Patient has been having intermittent fevers x 2 weeks assoc with diarrhea.  ED Course: She is noted to be mildly tachycardic, blood pressure is otherwise stable.  Chest x-ray indicated dense right lower lobe consolidation.  She was noted to have elevated WBC count of 29,000.  Renal function was otherwise normal.    Hgb 7.8 with  heme positive stools.  Pertinent  Medical History  Anxiety, Arthritis, COPD (chronic obstructive pulmonary disease) (HCC), Coronary artery disease, GERD (gastroesophageal reflux disease), Headache(784.0), Hearing loss, central, Hematoma, Hypertension, Memory changes, Mild aortic stenosis, Mild carotid artery disease (HCC), and Seizures (HCC) (10/2012).    Significant Hospital Events: Including procedures, antibiotic start and stop dates in addition to other pertinent events   . Bc x  2  4/29 >  1/2 with staph epi   . UC  4/29 > E coli over 100 k sensitive to ceph . Zmax 4/29 - 5/3  . Roc  4/29 only  . Flagyl 4/30 >>> . Maxepime 4/30 >>> . Urinary legionella antigen 5/2 neg . Urinary strep antigen  5/2  Neg  . BC x 2  5/2  > neg  . MRSA PCR 5/4  POS . Vanc IV 5/5 >>> . U/s pleural 5/5 not enough to tap on either side    Scheduled Meds: . amLODipine  10 mg Oral Daily  . carvedilol  6.25 mg Oral BID WC  . Chlorhexidine Gluconate Cloth  6 each Topical Q0600  . diphenhydrAMINE  25 mg Oral Once  . furosemide  20 mg Intravenous Once  . guaiFENesin  600 mg Oral BID  . lactobacillus  1 g Oral TID WC  . latanoprost  1 drop Both Eyes QHS   . levETIRAcetam  500 mg Oral BID  . mupirocin ointment  1 application Nasal BID  . nystatin  5 mL Oral QID  . pantoprazole (PROTONIX) IV  40 mg Intravenous Q12H  . risperiDONE  0.5 mg Oral BID   Continuous Infusions: . ceFEPime (MAXIPIME) IV 2 g (05/08/20 0855)  . metronidazole 500 mg (05/08/20 1238)  . vancomycin 750 mg (05/08/20 1134)   PRN Meds:.acetaminophen **OR** acetaminophen, albuterol, ALPRAZolam, guaiFENesin, HYDROcodone-acetaminophen, magic mouthwash, ondansetron **OR** ondansetron (ZOFRAN) IV   Interim History / Subjective:  Taking yogurt/ frosty ok s choking, cough is non-productive no sob or need for 02   Objective   Blood pressure (!) 141/69, pulse (!) 107, temperature 97.8 F (36.6 C), resp. rate 19, height 4\' 10"  (1.473 m), weight 57.1 kg, SpO2 98 %.    FiO2 (%):  [28 %] 28 %   Intake/Output Summary (Last 24 hours) at 05/08/2020 1310 Last data filed at 05/07/2020 1700 Gross per 24 hour  Intake 120 ml  Output --  Net 120 ml   Filed Weights   05/01/20 1127 05/01/20 1751  Weight: 72.6 kg 57.1 kg    Examination: Tmax 99.6 General appearance:    Pale / weak chronically ill appearing  At Rest 02 sats  95% RA  No jvd Oropharynx clear,  mucosa nl Neck supple Lungs with a few scattered exp >  insp rhonchi bilaterally RRR no s3 or or sign murmur Abd soft/ nl excursion  Extr warm with no edema or clubbing noted       Assessment & Plan:  1)  Likely CAP ? Asp mechanism with central necrosis  Risk evolving to lung abscess strongly favored over lung ca in setting of dementia and location in the RLL assoc with 2 weeks of a febrile illness  >>> rx with broad abx as planned, no role for FOB here >>> daugter given my card for outpt f/u in 2 weeks with repeat cxr if she wished (vs palliative approach)  2) Likely R paranpneumonic R effusion >>>too small to tap by u/s 5/5    3) acute hypoxemic resp failure> resolved - no wob or desats on RA 5/6   Grand daughter at  bedside wants to look into taking her home and that's fine with me if POA agrees as not much more medical science can do here given underlying frailty     Labs   CBC: Recent Labs  Lab 05/01/20 1943 05/02/20 0603 05/04/20 0808 05/05/20 0514 05/05/20 0830 05/05/20 1100 05/05/20 1553 05/06/20 0446 05/07/20 0559 05/08/20 0551  WBC 23.0*   < > 15.9* 16.4*  --   --   --  19.7* 21.8* 22.4*  NEUTROABS 19.5*  --  13.1*  --   --   --   --   --   --   --   HGB 7.0*   < > 8.1* 6.4*   < > 7.1* 9.9* 9.9* 9.7* 9.5*  HCT 22.8*   < > 25.3* 21.1*   < > 22.7* 30.6* 31.1* 31.5* 30.2*  MCV 79.4*   < > 76.2* 77.0*  --   --   --  78.1* 80.6 79.7*  PLT 505*   < > 597* 510*  --   --   --  578* 401* 502*   < > = values in this interval not displayed.    Basic Metabolic Panel: Recent Labs  Lab 05/04/20 0608 05/05/20 0514 05/06/20 0446 05/07/20 0559 05/08/20 0551  NA 131* 132* 132* 130* 131*  K 2.7* 4.1 4.1 3.7 3.0*  CL 93* 98 96* 94* 97*  CO2 27 29 28 26 27   GLUCOSE 111* 103* 89 118* 128*  BUN 7* 11 14 11 11   CREATININE 0.40* 0.41* 0.51 0.42* 0.43*  CALCIUM 8.3* 7.9* 8.3* 8.2* 8.0*  MG 1.1*  --   --   --  1.2*  PHOS 2.1*  --   --   --   --    GFR: Estimated Creatinine Clearance: 38.5 mL/min (A) (by C-G formula based on SCr of 0.43 mg/dL (L)). Recent Labs  Lab 05/04/20 0608 05/04/20 0808 05/04/20 0809 05/05/20 0514 05/06/20 0446 05/07/20 0559 05/08/20 0551  PROCALCITON 0.82  --   --   --   --  0.95  --   WBC 16.1*   < >  --  16.4* 19.7* 21.8* 22.4*  LATICACIDVEN  --   --  1.1  --   --   --   --    < > = values in this interval not displayed.    Liver Function Tests: Recent Labs  Lab 05/02/20 0603 05/03/20 0455 05/04/20 0608  AST 17 19  --   ALT 10 12  --   ALKPHOS 121 109  --   BILITOT 0.7 0.7  --   PROT 6.2* 6.1*  --   ALBUMIN 1.9* 1.8*  2.0*   No results for input(s): LIPASE, AMYLASE in the last 168 hours. No results for input(s): AMMONIA in the last 168  hours.  ABG    Component Value Date/Time   PHART 7.470 (H) 01/10/2018 1346   PCO2ART 35.1 01/10/2018 1346   PO2ART 72.0 (L) 01/10/2018 1346   HCO3 25.5 01/10/2018 1346   TCO2 27 01/10/2018 1346   O2SAT 95.0 01/10/2018 1346     Coagulation Profile: Recent Labs  Lab 05/02/20 0603 05/04/20 0808  INR 1.2 1.2    Cardiac Enzymes: No results for input(s): CKTOTAL, CKMB, CKMBINDEX, TROPONINI in the last 168 hours.  HbA1C: Hgb A1c MFr Bld  Date/Time Value Ref Range Status  10/19/2012 01:13 AM 6.1 (H) <5.7 % Final    Comment:    (NOTE)                                                                       According to the ADA Clinical Practice Recommendations for 2011, when HbA1c is used as a screening test:  >=6.5%   Diagnostic of Diabetes Mellitus           (if abnormal result is confirmed) 5.7-6.4%   Increased risk of developing Diabetes Mellitus References:Diagnosis and Classification of Diabetes Mellitus,Diabetes Care,2011,34(Suppl 1):S62-S69 and Standards of Medical Care in         Diabetes - 2011,Diabetes Care,2011,34 (Suppl 1):S11-S61.    CBG: No results for input(s): GLUCAP in the last 168 hours.     Past Medical History:  She,  has a past medical history of Anxiety, Arthritis, COPD (chronic obstructive pulmonary disease) (HCC), Coronary artery disease, GERD (gastroesophageal reflux disease), Headache(784.0), Hearing loss, central, Hematoma, Hypertension, Memory changes, Mild aortic stenosis, Mild carotid artery disease (HCC), and Seizures (HCC) (10/2012).   Surgical History:   Past Surgical History:  Procedure Laterality Date  . ABDOMINAL HYSTERECTOMY    . APPENDECTOMY    . CARDIAC CATHETERIZATION     15 yrs ago  . CORONARY/GRAFT ACUTE MI REVASCULARIZATION N/A 01/10/2018   Procedure: Coronary/Graft Acute MI Revascularization;  Surgeon: Runell Gess, MD;  Location: [facility] INVASIVE CV LAB;  Service: Cardiovascular;  Laterality: N/A;  . CRANIOTOMY Left 10/30/2012    Procedure: CRANIOTOMY HEMATOMA EVACUATION SUBDURAL;  Surgeon: Maeola Harman, MD;  Location: [facility] NEURO ORS;  Service: Neurosurgery;  Laterality: Left;  Left Craniotomy for evacuation of subdural hematoma  . JOINT REPLACEMENT Bilateral    knees  . LEFT HEART CATH AND CORONARY ANGIOGRAPHY N/A 01/10/2018   Procedure: LEFT HEART CATH AND CORONARY ANGIOGRAPHY;  Surgeon: Runell Gess, MD;  Location: [facility] INVASIVE CV LAB;  Service: Cardiovascular;  Laterality: N/A;     Social History:   reports that she has never smoked. She has never used smokeless tobacco. She reports that she does not drink alcohol and does not use drugs.   Family History:  Her family history includes Cancer in her father. There is no history of Ataxia, Chorea, Dementia, Mental retardation, Migraines, Multiple sclerosis, Neurofibromatosis, Neuropathy, Parkinsonism, Seizures, or Stroke.   Allergies Allergies  Allergen Reactions  . Lisinopril Swelling  . Eggs Or Egg-Derived Products Itching    Patient reports that she can eat eggs but does not tolerate egg derived products  .  Morphine And Related     Headache  . Penicillins Itching    DID THE REACTION INVOLVE: Swelling of the face/tongue/throat, SOB, or low BP? No Sudden or severe rash/hives, skin peeling, or the inside of the mouth or nose? No Did it require medical treatment? No When did it last happen? Unknown If all above answers are "NO", may proceed with cephalosporin use.      Home Medications  Prior to Admission medications   Medication Sig Start Date End Date Taking? Authorizing Provider  acetaminophen (TYLENOL) 500 MG tablet Take 1 tablet (500 mg total) by mouth as needed (no more than 3 gm / day). 12/04/19  Yes Cleaver, Thomasene RippleJesse M, NP  ALPRAZolam Prudy Feeler(XANAX) 0.5 MG tablet Take 0.5 mg by mouth 3 (three) times daily as needed for anxiety.   Yes [provider]  aspirin 81 MG chewable tablet Chew 1 tablet (81 mg total) by mouth daily. 01/13/18  Yes Georgie ChardMcDaniel,  Jill D, NP  atorvastatin (LIPITOR) 80 MG tablet Take 1 tablet (80 mg total) by mouth daily at 6 PM. 01/12/18  Yes Georgie ChardMcDaniel, Jill D, NP  carvedilol (COREG) 6.25 MG tablet TAKE ONE TABLET (6.25MG  TOTAL) BY MOUTH TWO TIMES DAILY 04/10/20  Yes Jonelle SidleMcDowell, Samuel G, MD  celecoxib (CELEBREX) 200 MG capsule Take 200 mg by mouth daily.   Yes [provider]  cholecalciferol (VITAMIN D) 1000 UNITS tablet Take 1,000 Units by mouth daily.    Yes [provider]  fluticasone (FLONASE) 50 MCG/ACT nasal spray Place 1 spray into both nostrils daily.  09/18/12  Yes [provider]  HYDROcodone-acetaminophen (NORCO) 10-325 MG tablet Take 1 tablet by mouth every 6 (six) hours as needed.   Yes [provider]  latanoprost (XALATAN) 0.005 % ophthalmic solution INSTILL ONE DROP IN BOTH EYES NIGHTLY 07/09/19  Yes Rankin, Alford HighlandGary A, MD  levETIRAcetam (KEPPRA) 500 MG tablet Take 500 mg by mouth 2 (two) times daily.   Yes [provider]  methocarbamol (ROBAXIN) 500 MG tablet Take 1 tablet by mouth in the morning and at bedtime.  02/22/19  Yes [provider]  nitroGLYCERIN (NITROSTAT) 0.4 MG SL tablet Place 1 tablet (0.4 mg total) under the tongue every 5 (five) minutes x 3 doses as needed for chest pain. 01/12/18  Yes Georgie ChardMcDaniel, Jill D, NP  pantoprazole (PROTONIX) 40 MG tablet Take 40 mg by mouth daily.   Yes [provider]  potassium chloride (K-DUR) 10 MEQ tablet Take 10 mEq by mouth daily.    Yes [provider]  Propylene Glycol (SYSTANE BALANCE OP) Place 1-2 drops into both eyes daily as needed (dry eyes).   Yes [provider]  risperiDONE (RISPERDAL) 0.5 MG tablet Take 0.5 mg by mouth 2 (two) times daily. 04/23/20  Yes [provider]  amLODipine (NORVASC) 5 MG tablet Take 1 tablet (5 mg total) by mouth daily. 05/29/19 12/04/19  Jonelle SidleMcDowell, Samuel G, MD  lisinopril (ZESTRIL) 20 MG tablet Take 1 tablet (20 mg total) by mouth 2 (two) times daily.  01/24/19 05/03/19  Ellsworth LennoxStrader, Brittany M, PA-C        Sandrea HughsMichael Omari Mcmanaway, MD Pulmonary and Critical Care Medicine [facility] Cell [phone]   After 7:00 pm call Elink  [phone]

## 2020-05-08 NOTE — Progress Notes (Addendum)
PROGRESS NOTE  Beverly [name] OVF:[id number] DOB: September 15, 1932 DOA: 05/01/2020 PCP: Benita Stabile, MD  Brief History: 87 year old female with a history of seizure disorder, hypertension, hyperlipidemia, mild dementia, admitted to the hospital with generalized weakness, fever and cough. Found to have significant right lower lobe pneumonia. CT indicates possible underlying lung abscess versus mass. She is on IV antibiotics. Also noted to have heme positive stools and anemia.  She was transfused one unit PRBC. GI following.  Pulmonary was consulted for her pneumonia vs lung mass-->No rule for bronchoscopy presently.  Assessment/Plan:  Sepsis -likely source pneumonia/UTI -waspresent on admission  -presented with fever, leukocytosis, hypoxia -Per sepsis protocol status post IV fluid resuscitation, on broad-spectrum antibiotics cefepime/Flagyl azithromycin -sepsis physiology now resolved  -PCT 0.82>>0.95  community-acquired pneumonia. -Currently on IV antibiotics >>>antibiotic has been broadened.. to cefepimeon 4/30 -vanc added 5/4 -CT chest shows underlying lung abscess versus mass -Pulmonology consulted appreciate follow-up -Strep pneumonia antigen negative, Legionella antigenneg -WBC continues to rise -5/5 horacocentesis attempted--not enough effusion to safely perform -05/08/20--repeat CT chest as WBC continues to rise  Lung lesion-nodule versus versus abscess -Findings on imaging-CT chest abdomen pelvis reviewed: Dense consolidative opacity in the right lower lobe with 3 cm central low-density lesion in the lung parenchyma. This lesion may be related to necrotic pneumonia/pulmonary abscess although neoplasm not excluded -Pulm Dr. Wertconsulted--recommended thoracentesis for fluid analysis to distinguish between infection or neoplasm---after numerous discussions, pt has agreed to [place] -He recommended continue current treatment IV antibiotics --5/5  horacocentesis attempted--not enough effusion to safely perform -05/08/20--repeat CT chest as WBC continues to rise  Diarrhea -Noted to have significant diarrhea over the past 2 weeks. -Currently improved, reporting of 2 stools in past 24 hours -CT abdomen does not indicate any clear-cut colitis -Stool for C. difficile is negative -Use Imodium as needed -continuinglactobacillus -improved  Urinary tract infection -Urine culture positive for E. Coli -This is being covered by current antibiotics   Iron deficiency anemia -Hemoglobin dropped 8.0, 8.1, 6.4>>>7.1 -Pros and cons of blood transfusion discussed with patient and daughter at bedside -She was noted to have heme positive stools -Per patient, she does not have any melena or hematochezia -Continue on PPI -Anemia panel indicates iron deficiency -Seen by GI, appreciate input -She will need EGD/colonoscopy once medically stable--daughter has declined further eval unless pt worsens -We will consider IV iron infusion once infectious process has stabilized -Overall hemoglobin appears to be stable -Transfuse for hemoglobin less than 7  Seizure disorder -Continue on Keppra -Stable  Hyperlipidemia -Continue on statin  Hypertension -Blood pressure is trending up -Resume Coreg and amlodipine  Anxiety -Continue on home dose of Xanax -Stable  Generalized weakness -Seen by physical therapy with recommendations for skilled nursing facility placement -patient refuses SNF  Goals of Care -palliative consult for GOC discussion -05/08/20 GOC discussion with daughter--continue current treatment; confirmed DNR  Hypokalemia/Hypomagnesemia -replete  Hyponatremia -due to volume depletion and poor solute intake -start IV NS  Right arm edema -venous duplex --neg DVT -uric acid 1.4      Status is: Inpatient  Remains inpatient appropriate because:IV treatments appropriate due to intensity of illness or  inability to take PO   Dispo: The patient is from:Home Anticipated d/c is AC:[facility] Patient currently is not medically stable to d/c. Difficult to place patient No        Family Communication:Daughter updatedat bedside 5/5  Consultants:Palliative; pulmonary  Code Status:DNR  DVT Prophylaxis: SCDs  Procedures: As Listed in Progress Note Above  Antibiotics: Cefepime 4/30>> Metronidazole 4/30>> Vanco 5/4>>    Total time spent 35 minutes.  Greater than 50% spent face to face counseling and coordinating care.     Subjective: Patient denies fevers, chills, headache, chest pain, dyspnea, nausea, vomiting, diarrhea, abdominal pain   Objective: Vitals:   05/07/20 1412 05/07/20 2042 05/08/20 0523 05/08/20 1500  BP: (!) 153/81 132/68 (!) 141/69 (!) 149/73  Pulse: (!) 108 91 (!) 107 (!) 105  Resp: Temp: 99.6 F (37.6 C) 99.4 F (37.4 C) 97.8 F (36.6 C) 99.3 F (37.4 C)  TempSrc: Oral Oral  Oral  SpO2: 99% 100% 98% 95%  Weight:      Height:        Intake/Output Summary (Last 24 hours) at 05/08/2020 1527 Last data filed at 05/07/2020 1700 Gross per 24 hour  Intake 120 ml  Output --  Net 120 ml   Weight change:  Exam:   General:  Pt is alert, follows commands appropriately, not in acute distress  HEENT: No icterus, No thrush, No neck mass, [place]/AT  Cardiovascular: RRR, S1/S2, no rubs, no gallops  Respiratory: bibasilar rales. No wheeze  Abdomen: Soft/+BS, non tender, non distended, no guarding  Extremities: No edema, No lymphangitis, No petechiae, No rashes, no synovitis   Data Reviewed: I have personally reviewed following labs and imaging studies Basic Metabolic Panel: Recent Labs  Lab 05/04/20 0608 05/05/20 0514 05/06/20 0446 05/07/20 0559 05/08/20 0551  NA 131* 132* 132* 130* 131*  K 2.7* 4.1 4.1 3.7 3.0*  CL 93* 98 96* 94* 97*  CO2 GLUCOSE  111* 103* 89 118* 128*  BUN 7* CREATININE 0.40* 0.41* 0.51 0.42* 0.43*  CALCIUM 8.3* 7.9* 8.3* 8.2* 8.0*  MG 1.1*  --   --   --  1.2*  PHOS 2.1*  --   --   --   --    Liver Function Tests: Recent Labs  Lab 05/02/20 0603 05/03/20 0455 05/04/20 0608  AST 17 19  --   ALT 10 12  --   ALKPHOS 121 109  --   BILITOT 0.7 0.7  --   PROT 6.2* 6.1*  --   ALBUMIN 1.9* 1.8* 2.0*   No results for input(s): LIPASE, AMYLASE in the last 168 hours. No results for input(s): AMMONIA in the last 168 hours. Coagulation Profile: Recent Labs  Lab 05/02/20 0603 05/04/20 0808  INR 1.2 1.2   CBC: Recent Labs  Lab 05/01/20 1943 05/02/20 0603 05/04/20 0808 05/05/20 0514 05/05/20 0830 05/05/20 1100 05/05/20 1553 05/06/20 0446 05/07/20 0559 05/08/20 0551  WBC 23.0*   < > 15.9* 16.4*  --   --   --  19.7* 21.8* 22.4*  NEUTROABS 19.5*  --  13.1*  --   --   --   --   --   --   --   HGB 7.0*   < > 8.1* 6.4*   < > 7.1* 9.9* 9.9* 9.7* 9.5*  HCT 22.8*   < > 25.3* 21.1*   < > 22.7* 30.6* 31.1* 31.5* 30.2*  MCV 79.4*   < > 76.2* 77.0*  --   --   --  78.1* 80.6 79.7*  PLT 505*   < > 597* 510*  --   --   --  578* 401* 502*   < > = values in this interval  not displayed.   Cardiac Enzymes: No results for input(s): CKTOTAL, CKMB, CKMBINDEX, TROPONINI in the last 168 hours. BNP: Invalid input(s): POCBNP CBG: No results for input(s): GLUCAP in the last 168 hours. HbA1C: No results for input(s): HGBA1C in the last 72 hours. Urine analysis:    Component Value Date/Time   COLORURINE YELLOW 05/01/2020 1215   APPEARANCEUR CLEAR 05/01/2020 1215   LABSPEC 1.019 05/01/2020 1215   PHURINE 5.0 05/01/2020 1215   GLUCOSEU NEGATIVE 05/01/2020 1215   HGBUR NEGATIVE 05/01/2020 1215   BILIRUBINUR NEGATIVE 05/01/2020 1215   KETONESUR NEGATIVE 05/01/2020 1215   PROTEINUR 30 (A) 05/01/2020 1215   UROBILINOGEN 0.2 10/18/2012 1944   NITRITE NEGATIVE 05/01/2020 1215   LEUKOCYTESUR MODERATE (A)  05/01/2020 1215   Sepsis Labs: (procalcitonin:4,lacticidven:4) ) Recent Results (from the past 240 hour(s))  Blood Culture (routine x 2)     Status: None   Collection Time: 05/01/20 11:41 AM   Specimen: BLOOD  Result Value Ref Range Status   Specimen Description BLOOD RIGHT ARM  Final   Special Requests   Final    BOTTLES DRAWN AEROBIC AND ANAEROBIC Blood Culture adequate volume   Culture   Final    NO GROWTH 5 DAYS Performed at [facility] [address], [place], [place] [address]    Report Status 05/06/2020 FINAL  Final  Blood Culture (routine x 2)     Status: Abnormal   Collection Time: 05/01/20 11:42 AM   Specimen: BLOOD  Result Value Ref Range Status   Specimen Description   Final    BLOOD LEFT ARM Performed at [facility], [address], [place], [place] [address]    Special Requests   Final    BOTTLES DRAWN AEROBIC AND ANAEROBIC Blood Culture adequate volume Performed at [facility], [address], [place], [place] [address]    Culture  Setup Time   Final    GRAM POSITIVE COCCI ANAEROBIC BOTTLE Gram Stain Report Called to,Read Back By and Verified With: HOWARDSON,M@1416  BY MATTHEWS, B 5.1.22 CRITICAL RESULT CALLED TO, READ BACK BY AND VERIFIED WITH: N ONDARA RN  05/03/20 AH    Culture (A)  Final    STAPHYLOCOCCUS EPIDERMIDIS THE SIGNIFICANCE OF ISOLATING THIS ORGANISM FROM A SINGLE SET OF BLOOD CULTURES WHEN MULTIPLE SETS ARE DRAWN IS UNCERTAIN. PLEASE NOTIFY THE MICROBIOLOGY DEPARTMENT WITHIN ONE WEEK IF SPECIATION AND SENSITIVITIES ARE REQUIRED. Performed at [facility], [address], [place], [place] [address]    Report Status 05/06/2020 FINAL  Final  Blood Culture ID Panel (Reflexed)     Status: Abnormal   Collection Time: 05/01/20 11:42 AM  Result Value Ref Range Status   Enterococcus faecalis NOT DETECTED NOT DETECTED Final   Enterococcus Faecium NOT DETECTED NOT DETECTED Final   Listeria monocytogenes NOT DETECTED NOT DETECTED  Final   Staphylococcus species DETECTED (A) NOT DETECTED Final    Comment: CRITICAL RESULT CALLED TO, READ BACK BY AND VERIFIED WITH: NACKSON ONDARA,RN 05/03/2020 AT 2155 A.HUGHES    Staphylococcus aureus (BCID) NOT DETECTED NOT DETECTED Final   Staphylococcus epidermidis DETECTED (A) NOT DETECTED Final    Comment: Methicillin (oxacillin) resistant coagulase negative staphylococcus. Possible blood culture contaminant (unless isolated from more than one blood culture draw or clinical case suggests pathogenicity). No antibiotic treatment is indicated for blood  culture contaminants. CRITICAL RESULT CALLED TO, READ BACK BY AND VERIFIED WITH: NACKSON ONDARA,RN 05/03/2020 AT 2155 A.HUGHES    Staphylococcus lugdunensis NOT DETECTED NOT DETECTED Final  Streptococcus species NOT DETECTED NOT DETECTED Final   Streptococcus agalactiae NOT DETECTED NOT DETECTED Final   Streptococcus pneumoniae NOT DETECTED NOT DETECTED Final   Streptococcus pyogenes NOT DETECTED NOT DETECTED Final   A.calcoaceticus-baumannii NOT DETECTED NOT DETECTED Final   Bacteroides fragilis NOT DETECTED NOT DETECTED Final   Enterobacterales NOT DETECTED NOT DETECTED Final   Enterobacter cloacae complex NOT DETECTED NOT DETECTED Final   Escherichia coli NOT DETECTED NOT DETECTED Final   Klebsiella aerogenes NOT DETECTED NOT DETECTED Final   Klebsiella oxytoca NOT DETECTED NOT DETECTED Final   Klebsiella pneumoniae NOT DETECTED NOT DETECTED Final   Proteus species NOT DETECTED NOT DETECTED Final   Salmonella species NOT DETECTED NOT DETECTED Final   Serratia marcescens NOT DETECTED NOT DETECTED Final   Haemophilus influenzae NOT DETECTED NOT DETECTED Final   Neisseria meningitidis NOT DETECTED NOT DETECTED Final   Pseudomonas aeruginosa NOT DETECTED NOT DETECTED Final   Stenotrophomonas maltophilia NOT DETECTED NOT DETECTED Final   Candida albicans NOT DETECTED NOT DETECTED Final   Candida auris NOT DETECTED NOT DETECTED  Final   Candida glabrata NOT DETECTED NOT DETECTED Final   Candida krusei NOT DETECTED NOT DETECTED Final   Candida parapsilosis NOT DETECTED NOT DETECTED Final   Candida tropicalis NOT DETECTED NOT DETECTED Final   Cryptococcus neoformans/gattii NOT DETECTED NOT DETECTED Final   Methicillin resistance mecA/C DETECTED (A) NOT DETECTED Final    Comment: CRITICAL RESULT CALLED TO, READ BACK BY AND VERIFIED WITH: NACKSON ONDARA,RN 05/03/2020 AT 2155 A.HUGHES Performed at [facility], [address] [place], [place] [address]   Urine culture     Status: Abnormal   Collection Time: 05/01/20 12:15 PM   Specimen: Urine, Clean Catch  Result Value Ref Range Status   Specimen Description   Final    URINE, CLEAN CATCH Performed at [facility], [address] [place], [place] [address]    Special Requests   Final    NONE Performed at [facility], [address] [place], [place] [address]    Culture >=100,000 COLONIES/mL ESCHERICHIA COLI (A)  Final   Report Status 05/04/2020 FINAL  Final   Organism ID, Bacteria ESCHERICHIA COLI (A)  Final      Susceptibility   Escherichia coli - MIC*    AMPICILLIN >=32 RESISTANT Resistant     CEFAZOLIN <=4 SENSITIVE Sensitive     CEFEPIME <=0.12 SENSITIVE Sensitive     CEFTRIAXONE <=0.25 SENSITIVE Sensitive     CIPROFLOXACIN <=0.25 SENSITIVE Sensitive     GENTAMICIN >=16 RESISTANT Resistant     IMIPENEM <=0.25 SENSITIVE Sensitive     NITROFURANTOIN <=16 SENSITIVE Sensitive     TRIMETH/SULFA >=320 RESISTANT Resistant     AMPICILLIN/SULBACTAM >=32 RESISTANT Resistant     PIP/TAZO <=4 SENSITIVE Sensitive     * >=100,000 COLONIES/mL ESCHERICHIA COLI  Resp Panel by RT-PCR (Flu A&B, Covid) Nasopharyngeal Swab     Status: None   Collection Time: 05/01/20 12:15 PM   Specimen: Nasopharyngeal Swab; Nasopharyngeal(NP) swabs in vial transport medium  Result Value Ref Range Status   SARS Coronavirus 2 by RT PCR NEGATIVE NEGATIVE Final    Comment:  (NOTE) SARS-CoV-2 target nucleic acids are NOT DETECTED.  The SARS-CoV-2 RNA is generally detectable in upper respiratory specimens during the acute phase of infection. The lowest concentration of SARS-CoV-2 viral copies this assay can detect is 138 copies/mL. A negative result does not preclude SARS-Cov-2 infection and should not be used as the sole basis  for treatment or other patient management decisions. A negative result may occur with  improper specimen collection/handling, submission of specimen other than nasopharyngeal swab, presence of viral mutation(s) within the areas targeted by this assay, and inadequate number of viral copies(<138 copies/mL). A negative result must be combined with clinical observations, patient history, and epidemiological information. The expected result is Negative.  Fact Sheet for Patients:  BloggerCourse.com  Fact Sheet for Healthcare Providers:  SeriousBroker.it  This test is no t yet approved or cleared by the [place] FDA and  has been authorized for detection and/or diagnosis of SARS-CoV-2 by FDA under an Emergency Use Authorization (EUA). This EUA will remain  in effect (meaning this test can be used) for the duration of the COVID-19 declaration under Section 564(b)(1) of the Act, 21 U.S.C.section 360bbb-3(b)(1), unless the authorization is terminated  or revoked sooner.       Influenza A by PCR NEGATIVE NEGATIVE Final   Influenza B by PCR NEGATIVE NEGATIVE Final    Comment: (NOTE) The Xpert Xpress SARS-CoV-2/FLU/RSV plus assay is intended as an aid in the diagnosis of influenza from Nasopharyngeal swab specimens and should not be used as a sole basis for treatment. Nasal washings and aspirates are unacceptable for Xpert Xpress SARS-CoV-2/FLU/RSV testing.  Fact Sheet for Patients: BloggerCourse.com  Fact Sheet for Healthcare  Providers: SeriousBroker.it  This test is not yet approved or cleared by the [place] FDA and has been authorized for detection and/or diagnosis of SARS-CoV-2 by FDA under an Emergency Use Authorization (EUA). This EUA will remain in effect (meaning this test can be used) for the duration of the COVID-19 declaration under Section 564(b)(1) of the Act, 21 U.S.C. section 360bbb-3(b)(1), unless the authorization is terminated or revoked.  Performed at [facility], [address], [place], [place] [address]   C Difficile Quick Screen w PCR reflex     Status: None   Collection Time: 05/02/20  3:39 PM   Specimen: STOOL  Result Value Ref Range Status   C Diff antigen NEGATIVE NEGATIVE Final   C Diff toxin NEGATIVE NEGATIVE Final   C Diff interpretation No C. difficile detected.  Final    Comment: Performed at [facility], [address] [place], [place] [address]  Culture, blood (x 2)     Status: None (Preliminary result)   Collection Time: 05/04/20  8:07 AM   Specimen: BLOOD  Result Value Ref Range Status   Specimen Description BLOOD LEFT ANTECUBITAL  Final   Special Requests   Final    BOTTLES DRAWN AEROBIC AND ANAEROBIC Blood Culture adequate volume   Culture   Final    NO GROWTH 4 DAYS Performed at [facility], [address], [place], [place] [address]    Report Status PENDING  Incomplete  Culture, blood (x 2)     Status: None (Preliminary result)   Collection Time: 05/04/20  8:09 AM   Specimen: BLOOD  Result Value Ref Range Status   Specimen Description BLOOD BLOOD LEFT HAND  Final   Special Requests   Final    BOTTLES DRAWN AEROBIC AND ANAEROBIC Blood Culture adequate volume   Culture   Final    NO GROWTH 4 DAYS Performed at [facility], [address], [place], [place] [address]    Report Status PENDING  Incomplete  MRSA PCR Screening     Status: Abnormal   Collection Time: 05/06/20 12:47 PM   Specimen: Nasal Mucosa; Nasopharyngeal   Result Value Ref Range Status  MRSA by PCR POSITIVE (A) NEGATIVE Final    Comment:        The GeneXpert MRSA Assay (FDA approved for NASAL specimens only), is one component of a comprehensive MRSA colonization surveillance program. It is not intended to diagnose MRSA infection nor to guide or monitor treatment for MRSA infections. RESULT CALLED TO, READ BACK BY AND VERIFIED WITH: DILDY,VAL RN  05/06/20 BY JONES,T Performed at [facility], [address] [place], [place] [address]      Scheduled Meds: . amLODipine  10 mg Oral Daily  . carvedilol  6.25 mg Oral BID WC  . Chlorhexidine Gluconate Cloth  6 each Topical Q0600  . diphenhydrAMINE  25 mg Oral Once  . furosemide  20 mg Intravenous Once  . guaiFENesin  600 mg Oral BID  . lactobacillus  1 g Oral TID WC  . latanoprost  1 drop Both Eyes QHS  . levETIRAcetam  500 mg Oral BID  . mupirocin ointment  1 application Nasal BID  . nystatin  5 mL Oral QID  . pantoprazole (PROTONIX) IV  40 mg Intravenous Q12H  . risperiDONE  0.5 mg Oral BID   Continuous Infusions: . ceFEPime (MAXIPIME) IV 2 g (05/08/20 0855)  . metronidazole 500 mg (05/08/20 1238)  . vancomycin 750 mg (05/08/20 1134)    Procedures/Studies: DG Elbow 2 Views Right  Result Date: 05/07/2020 CLINICAL DATA:  Arm pain with limited range of motion EXAM: RIGHT ELBOW - 2 VIEW COMPARISON:  None. FINDINGS: No fracture or malalignment. Suspected elbow effusion. There is soft tissue swelling. IMPRESSION: Probable elbow effusion.  No definite acute osseous abnormality. Electronically Signed   By: Jasmine Pang M.D.   On: 05/07/2020 18:18   DG Wrist 2 Views Right  Result Date: 05/07/2020 CLINICAL DATA:  Right wrist pain, no known injury, initial encounter EXAM: RIGHT WRIST - 2 VIEW COMPARISON:  None. FINDINGS: Degenerative changes of the first [facility] joint are noted. No acute fracture or dislocation is noted. Mild osteopenia is seen. Some soft tissue swelling about the wrist  is noted. IMPRESSION: Mild degenerative changes without acute abnormality. Electronically Signed   By: Alcide Clever M.D.   On: 05/07/2020 11:19   [place] CHEST (PLEURAL EFFUSION)  Result Date: 05/07/2020 CLINICAL DATA:  RIGHT pleural effusion question sufficient for thoracentesis EXAM: CHEST ULTRASOUND COMPARISON:  CT chest 05/02/2020 FINDINGS: Minimal LEFT pleural effusion. Trace RIGHT pleural effusion. Insufficient RIGHT pleural fluid for diagnostic thoracentesis. IMPRESSION: Insufficient RIGHT pleural fluid for diagnostic thoracentesis. Electronically Signed   By: Ulyses Southward M.D.   On: 05/07/2020 15:08   CT CHEST ABDOMEN PELVIS W CONTRAST  Result Date: 05/02/2020 CLINICAL DATA:  Sepsis.  Community-acquired pneumonia. EXAM: CT CHEST, ABDOMEN, AND PELVIS WITH CONTRAST TECHNIQUE: Multidetector CT imaging of the chest, abdomen and pelvis was performed following the standard protocol during bolus administration of intravenous contrast. CONTRAST:  OMNIPAQUE IOHEXOL 300 MG/ML  SOLN COMPARISON:  No comparison studies available. FINDINGS: CT CHEST FINDINGS Cardiovascular: The heart size is normal. No substantial pericardial effusion. Coronary artery calcification is evident. Mitral annular calcification noted. Atherosclerotic calcification is noted in the wall of the thoracic aorta. Mediastinum/Nodes: Mediastinal lymphadenopathy evident. 15 mm short axis precarinal node shows central necrosis. 2.3 cm short axis subcarinal lymph node evident. No left hilar lymphadenopathy. Soft tissue fullness noted in the right hilum. Moderate to large hiatal hernia. There is no axillary lymphadenopathy. Lungs/Pleura: Dense consolidative opacity noted in the right lower lobe with 3 cm central low-density lesion  in the lung parenchyma. This is associated with mild left base collapse/consolidation, moderate right pleural effusion and small left pleural effusion. Patchy areas of peripheral airspace disease are noted in the left  upper lobe. Musculoskeletal: Bones are diffusely demineralized age-indeterminate nondisplaced fractures of the anterior right third and fourth ribs noted. Degenerative changes are evident in both shoulders, right greater than left. CT ABDOMEN PELVIS FINDINGS Hepatobiliary: No suspicious focal abnormality within the liver parenchyma. Gallbladder is distended. No intrahepatic or extrahepatic biliary dilation. Pancreas: No focal mass lesion. No dilatation of the main duct. No intraparenchymal cyst. No peripancreatic edema. Spleen: No splenomegaly. No focal mass lesion. Adrenals/Urinary Tract: No adrenal nodule or mass. Kidneys unremarkable. No evidence for hydroureter. The urinary bladder appears normal for the degree of distention. Stomach/Bowel: Moderate to large hiatal hernia Duodenum is normally positioned as is the ligament of Treitz. No small bowel wall thickening. No small bowel dilatation. The terminal ileum is normal. The appendix is not well visualized, but there is no edema or inflammation in the region of the cecum. Mild wall thickening noted in the sigmoid colon, potentially related to underdistention although infectious/inflammatory colitis not excluded. Vascular/Lymphatic: There is abdominal aortic atherosclerosis without aneurysm. There is no gastrohepatic or hepatoduodenal ligament lymphadenopathy. No retroperitoneal or mesenteric lymphadenopathy. No pelvic sidewall lymphadenopathy. Reproductive: Uterus surgically absent.  There is no adnexal mass. Other: Trace free fluid seen in the pelvis and in the left paracolic gutter. Musculoskeletal: No worrisome lytic or sclerotic osseous abnormality. Advanced degenerative disc disease noted lumbar spine. IMPRESSION: 1. Dense consolidative opacity in the right lower lobe with 3 cm central low-density lesion in the lung parenchyma. This lesion may be related to necrotic pneumonia/pulmonary abscess although neoplasm not excluded. 2. Mediastinal and right hilar  lymphadenopathy is. Subcarinal lymph node is somewhat more bulky than typically seen for reactive etiology and metastatic disease is a concern. 3. Patchy areas of peripheral airspace disease in the left upper lobe, likely infectious/inflammatory. 4. Moderate to large hiatal hernia. 5. Mild wall thickening in the sigmoid colon, potentially related to underdistention although infectious/inflammatory colitis not excluded. 6. Trace free fluid in the pelvis and left paracolic gutter. 7. Aortic Atherosclerosis (ICD10-I70.0). Electronically Signed   By: Kennith Center M.D.   On: 05/02/2020 14:21   US Venous Img Upper Uni Right(DVT)  Result Date: 05/08/2020 CLINICAL DATA:  87 year old female with right upper extremity pain and limited range of motion. EXAM: RIGHT UPPER EXTREMITY VENOUS DOPPLER ULTRASOUND TECHNIQUE: Gray-scale sonography with graded compression, as well as color Doppler and duplex ultrasound were performed to evaluate the upper extremity deep venous system from the level of the subclavian vein and including the jugular, axillary, basilic, radial, ulnar and upper cephalic vein. Spectral Doppler was utilized to evaluate flow at rest and with distal augmentation maneuvers. COMPARISON:  None. FINDINGS: Contralateral Subclavian Vein: Respiratory phasicity is normal and symmetric with the symptomatic side. No evidence of thrombus. Normal compressibility. Internal Jugular Vein: No evidence of thrombus. Normal compressibility, respiratory phasicity and response to augmentation. Subclavian Vein: No evidence of thrombus. Normal compressibility, respiratory phasicity and response to augmentation. Axillary Vein: No evidence of thrombus. Normal compressibility, respiratory phasicity and response to augmentation. Cephalic Vein: No evidence of thrombus. Normal compressibility, respiratory phasicity and response to augmentation. Basilic Vein: No evidence of thrombus. Normal compressibility, respiratory phasicity and  response to augmentation. Brachial Veins: No evidence of thrombus. Normal compressibility, respiratory phasicity and response to augmentation. Radial Veins: No evidence of thrombus. Normal compressibility, respiratory phasicity and response  to augmentation. Ulnar Veins: No evidence of thrombus. Normal compressibility, respiratory phasicity and response to augmentation. Venous Reflux:  None visualized. Other Findings:  None visualized. IMPRESSION: No evidence of DVT within the right upper extremity. Electronically Signed   By: Malachy Moan M.D.   On: 05/08/2020 15:14   DG Chest Port 1 View  Result Date: 05/01/2020 CLINICAL DATA:  Sepsis. EXAM: PORTABLE CHEST 1 VIEW COMPARISON:  January 10, 2019. FINDINGS: Stable cardiomediastinal silhouette. Stable hiatal hernia. No pneumothorax is noted. Left lung is clear. New large rounded opacity is noted in right lower lobe which may represent pneumonia, but neoplasm cannot be excluded. Mild right basilar subsegmental atelectasis and right pleural effusion is noted as well. Bony thorax is unremarkable. IMPRESSION: New large ill-defined rounded opacity is noted in right lower lobe which may represent pneumonia, but neoplasm cannot be excluded. CT scan of the chest with intravenous contrast is recommended for further evaluation. Aortic Atherosclerosis (ICD10-I70.0). Electronically Signed   By: Lupita Raider M.D.   On: 05/01/2020 12:56   DG Humerus Right  Result Date: 05/07/2020 CLINICAL DATA:  Arm pain EXAM: RIGHT HUMERUS - 2+ VIEW COMPARISON:  Shoulder radiograph 05/03/2019 FINDINGS: Moderate AC joint degenerative change and glenohumeral degenerative change. No fracture or malalignment. High-riding appearance of humeral head consistent with rotator cuff disease. Linear soft tissue calcifications in the region of the deltoid, possibly due to prior injury or dystrophic calcification. IMPRESSION: 1. No acute osseous abnormality 2. Arthritis of the [facility] joint and  glenohumeral interval 3. High-riding appearance of humeral head consistent with rotator cuff disease. Electronically Signed   By: Jasmine Pang M.D.   On: 05/07/2020 18:21   DG HIPS BILAT WITH PELVIS MIN 5 VIEWS  Result Date: 05/01/2020 CLINICAL DATA:  Left hip pain after possible fall. EXAM: DG HIP (WITH OR WITHOUT PELVIS) 5+V BILAT COMPARISON:  None. FINDINGS: There is no evidence of hip fracture or dislocation. Moderate narrowing of the right hip joint is noted. IMPRESSION: Moderate osteoarthritis of the right hip. No acute abnormality is noted in either hip. Electronically Signed   By: Lupita Raider M.D.   On: 05/01/2020 12:59    Catarina Hartshorn, DO  [facility]  If 7PM-7AM, please contact night-coverage [email or web address] Password [email or web address] 05/08/2020, 3:27 PM   LOS: 6 days

## 2020-05-09 DIAGNOSIS — Z7189 Other specified counseling: Secondary | ICD-10-CM | POA: Diagnosis not present

## 2020-05-09 DIAGNOSIS — J181 Lobar pneumonia, unspecified organism: Secondary | ICD-10-CM | POA: Diagnosis not present

## 2020-05-09 DIAGNOSIS — N390 Urinary tract infection, site not specified: Secondary | ICD-10-CM | POA: Diagnosis not present

## 2020-05-09 DIAGNOSIS — A419 Sepsis, unspecified organism: Secondary | ICD-10-CM | POA: Diagnosis not present

## 2020-05-09 LAB — BPAM RBC
Blood Product Expiration Date: 202205222359
Blood Product Expiration Date: 202205222359
ISSUE DATE / TIME: 202205031152
Unit Type and Rh: 5100
Unit Type and Rh: 5100

## 2020-05-09 LAB — TYPE AND SCREEN
ABO/RH(D): O POS
Antibody Screen: NEGATIVE
Unit division: 0
Unit division: 0

## 2020-05-09 LAB — CBC
HCT: 28 % — ABNORMAL LOW (ref 36.0–46.0)
Hemoglobin: 8.7 g/dL — ABNORMAL LOW (ref 12.0–15.0)
MCH: 24.9 pg — ABNORMAL LOW (ref 26.0–34.0)
MCHC: 31.1 g/dL (ref 30.0–36.0)
MCV: 80 fL (ref 80.0–100.0)
Platelets: 478 10*3/uL — ABNORMAL HIGH (ref 150–400)
RBC: 3.5 MIL/uL — ABNORMAL LOW (ref 3.87–5.11)
RDW: 21.2 % — ABNORMAL HIGH (ref 11.5–15.5)
WBC: 23.7 10*3/uL — ABNORMAL HIGH (ref 4.0–10.5)
nRBC: 0 % (ref 0.0–0.2)

## 2020-05-09 LAB — BASIC METABOLIC PANEL
Anion gap: 7 (ref 5–15)
BUN: 17 mg/dL (ref 8–23)
CO2: 26 mmol/L (ref 22–32)
Calcium: 8.3 mg/dL — ABNORMAL LOW (ref 8.9–10.3)
Chloride: 97 mmol/L — ABNORMAL LOW (ref 98–111)
Creatinine, Ser: 0.46 mg/dL (ref 0.44–1.00)
GFR, Estimated: >60 mL/min (ref >60)
Glucose, Bld: 120 mg/dL — ABNORMAL HIGH (ref 70–99)
Potassium: 3.4 mmol/L — ABNORMAL LOW (ref 3.5–5.1)
Sodium: 130 mmol/L — ABNORMAL LOW (ref 135–145)

## 2020-05-09 LAB — CULTURE, BLOOD (ROUTINE X 2)
Culture: NO GROWTH
Culture: NO GROWTH
Special Requests: ADEQUATE
Special Requests: ADEQUATE

## 2020-05-09 LAB — PROCALCITONIN: Procalcitonin: 0.47 ng/mL

## 2020-05-09 LAB — MAGNESIUM: Magnesium: 1.8 mg/dL (ref 1.7–2.4)

## 2020-05-09 MED ORDER — CEFDINIR 300 MG PO CAPS: 300.0000 mg | ORAL_CAPSULE | Freq: Two times a day (BID) | ORAL | 0 refills | Status: DC

## 2020-05-09 MED ORDER — MAGNESIUM SULFATE 2 GM/50ML IV SOLN
2.0000 g | Freq: Once | INTRAVENOUS | Status: AC
  Administered 2020-05-09: 2 g via INTRAVENOUS
  Filled 2020-05-09: qty 50

## 2020-05-09 MED ORDER — PREDNISONE 50 MG PO TABS: 50.0000 mg | ORAL_TABLET | Freq: Every day | ORAL | 0 refills | Status: DC

## 2020-05-09 MED ORDER — MAGNESIUM OXIDE -MG SUPPLEMENT 400 (240 MG) MG PO TABS
400.0000 mg | ORAL_TABLET | Freq: Every day | ORAL | Status: DC
  Filled 2020-05-09: qty 1

## 2020-05-09 MED ORDER — ALUM & MAG HYDROXIDE-SIMETH 200-200-20 MG/5ML PO SUSP
30.0000 mL | Freq: Once | ORAL | Status: AC
  Administered 2020-05-09: 30 mL via ORAL
  Filled 2020-05-09: qty 30

## 2020-05-09 MED ORDER — DOXYCYCLINE HYCLATE 100 MG PO TABS
100.0000 mg | ORAL_TABLET | Freq: Two times a day (BID) | ORAL | Status: DC
  Filled 2020-05-09: qty 1

## 2020-05-09 MED ORDER — PREDNISONE 20 MG PO TABS: 50.0000 mg | ORAL_TABLET | Freq: Every day | ORAL | Status: DC

## 2020-05-09 MED ORDER — CLOTRIMAZOLE 10 MG MT TROC
10.0000 mg | Freq: Every day | OROMUCOSAL | Status: DC
  Filled 2020-05-09 (×11): qty 1

## 2020-05-09 MED ORDER — CLOTRIMAZOLE 10 MG MT TROC: 10.0000 mg | Freq: Every day | OROMUCOSAL | 0 refills | Status: DC

## 2020-05-09 MED ORDER — CEFDINIR 300 MG PO CAPS
300.0000 mg | ORAL_CAPSULE | Freq: Two times a day (BID) | ORAL | Status: DC
  Filled 2020-05-09: qty 1

## 2020-05-09 MED ORDER — DOXYCYCLINE HYCLATE 100 MG PO TABS: 100.0000 mg | ORAL_TABLET | Freq: Two times a day (BID) | ORAL | 0 refills | Status: DC

## 2020-05-09 NOTE — Discharge Summary (Signed)
Physician Discharge Summary  Beverly [name] OFB:[id number] DOB: 03-17-1932 DOA: 05/01/2020  PCP: Benita Stabile, MD  Admit date: 05/01/2020 Discharge date: 05/09/2020  Admitted From: Home Disposition:  Home with Hospice  Recommendations for Outpatient Follow-up:  1. Follow up with PCP in 1-2 weeks 2. Please obtain BMP/CBC in one week   Home Health: Home with Hospice   Discharge Condition: Stable CODE STATUS: DNR Diet recommendation: Regular   Brief/Interim Summary: 87 year old female with a history of seizure disorder, hypertension, hyperlipidemia, mild dementia, admitted to the hospital with generalized weakness, fever and cough. Found to have significant right lower lobe pneumonia. CT indicates possible underlying lung abscess versus mass. She is on IV antibiotics. Also noted to have heme positive stools and anemia.She was transfused one unit PRBC.GI following.Pulmonary was consulted for her pneumonia vs lung mass-->No role for bronchoscopy presently.  The patient was continued on broad spectrum antibotics.  She did not improve clinically.  In fact, she gradually deteriorated with continued worsening leukocytosis and development of persistent fever.  Repeat CT chest Interval increase in ground-glass airspace opacities within the bilateral upper lobes suggestive of worsening infection/inflammation. Persistent right lower lobe consolidation. Underlying abscess formation or malignancy not excluded.  The patient continued to worsen with progressive generalized weakness and decreased po intake.  Palliative medicine was consulted.  Including myself, multiple GOC discussions took place with the patient's daughter.  On 05/09/20, the patient continued to have persistent fever and leukocytosis.  Patient expressed that she was "tired" and wanted to go home.  After discussion with patient's daughter, she wanted to take the patient home with hospice care.  With the assistance of the [facility] team, home  hospice was set up for discharge home.   Discharge Diagnoses:  Sepsis -likely source pneumonia/UTI -waspresent on admission -presented with fever, leukocytosis, hypoxia -Per sepsis protocol status post IV fluid resuscitation, on broad-spectrum antibiotics cefepime/Flagyl azithromycin -sepsis physiology now resolved  -PCT 0.82>>0.95 -WBC continues to rise with fever 102  community-acquired pneumonia. -Currently on IV antibiotics >>>antibiotic has been broadened..to cefepimeon 4/30 -vanc added 5/4 -CT chest shows underlying lung abscess versus mass -Pulmonology consulted appreciate follow-up -Strep pneumonia antigen negative, Legionella antigenneg -WBC continues to rise -5/5 horacocentesis attempted--not enough effusion to safely perform -05/08/20--repeat CT chest as discussed above -home with cefdinir and doxy x 4 more days  Lung lesion-nodule versus versus abscess -Findings on imaging-CT chest abdomen pelvis reviewed: Dense consolidative opacity in the right lower lobe with 3 cm central low-density lesion in the lung parenchyma. This lesion may be related to necrotic pneumonia/pulmonary abscess although neoplasm not excluded -Pulm Dr. Wertconsulted--recommended thoracentesis for fluid analysis to distinguish between infection or neoplasm---after numerous discussions, pt has agreed to [place] -He recommended continue current treatment IV antibiotics --5/5 horacocentesis attempted--not enough effusion to safely perform -05/08/20--repeat CT chest as discussed above  Diarrhea -Noted to have significant diarrhea over the past 2 weeks. -Currently improved, reporting of 2 stools in past 24 hours -CT abdomen does not indicate any clear-cut colitis -Stool for C. difficile is negative -Use Imodium as needed -continuinglactobacillus -improved  Urinary tract infection -Urine culture positive for E. Coli -This is being covered by current antibiotics   Iron deficiency  anemia -Hemoglobin dropped 8.0, 8.1, 6.4>>>7.1 -Pros and cons of blood transfusion discussed with patient and daughter at bedside -She was noted to have heme positive stools -Per patient, she does not have any melena or hematochezia -Continue on PPI -Anemia panel indicates iron deficiency -Seen by GI, appreciate input -  She will need EGD/colonoscopy once medically stable--daughter has declined further eval unless pt worsens -We will consider IV iron infusion once infectious process has stabilized -Overall hemoglobin appears to be stable -Transfuse for hemoglobin less than 7  Seizure disorder -Continue on Keppra -Stable  Hyperlipidemia -Continue on statin  Hypertension -Blood pressure is trending up -Resume Coreg and amlodipine  Anxiety -Continue on home dose of Xanax -Stable  Generalized weakness -Seen by physical therapy with recommendations for skilled nursing facility placement -patient refuses SNF  Goals of Care -palliative consult for GOC discussion -05/08/20 GOC discussion with daughter--continue current treatment; confirmed DNR  Hypokalemia/Hypomagnesemia -replete  Hyponatremia -due to volume depletion and poor solute intake -started IV NS  Right arm edema/Right wrist pain -venous duplex --neg DVT -uric acid 1.4 -suspect gouty arthritis -05/08/20 CT chest--Severe right shoulder degenerative changes with interval increase in periarticular fluid which could represent bursitis versus joint effusion -patient and daughter wound not want any operative intervention -treat with opioids and empiric steroids  Discharge Instructions   Allergies as of 05/09/2020      Reactions   Lisinopril Swelling   Eggs Or Egg-derived Products Itching   Patient reports that she can eat eggs but does not tolerate egg derived products   Morphine And Related    Headache   Penicillins Itching   DID THE REACTION INVOLVE: Swelling of the face/tongue/throat, SOB, or low BP?  No Sudden or severe rash/hives, skin peeling, or the inside of the mouth or nose? No Did it require medical treatment? No When did it last happen? Unknown If all above answers are "NO", may proceed with cephalosporin use.      Medication List    TAKE these medications   acetaminophen 500 MG tablet Commonly known as: TYLENOL Take 1 tablet (500 mg total) by mouth as needed (no more than 3 gm / day).   ALPRAZolam 0.5 MG tablet Commonly known as: XANAX Take 0.5 mg by mouth 3 (three) times daily as needed for anxiety.   amLODipine 5 MG tablet Commonly known as: NORVASC Take 1 tablet (5 mg total) by mouth daily.   aspirin 81 MG chewable tablet Chew 1 tablet (81 mg total) by mouth daily.   atorvastatin 80 MG tablet Commonly known as: LIPITOR Take 1 tablet (80 mg total) by mouth daily at 6 PM.   carvedilol 6.25 MG tablet Commonly known as: COREG TAKE ONE TABLET (6.25MG  TOTAL) BY MOUTH TWO TIMES DAILY   cefdinir 300 MG capsule Commonly known as: OMNICEF Take 1 capsule (300 mg total) by mouth every 12 (twelve) hours.   celecoxib 200 MG capsule Commonly known as: CELEBREX Take 200 mg by mouth daily.   cholecalciferol 1000 units tablet Commonly known as: VITAMIN D Take 1,000 Units by mouth daily.   doxycycline 100 MG tablet Commonly known as: VIBRA-TABS Take 1 tablet (100 mg total) by mouth every 12 (twelve) hours.   fluticasone 50 MCG/ACT nasal spray Commonly known as: FLONASE Place 1 spray into both nostrils daily.   HYDROcodone-acetaminophen 10-325 MG tablet Commonly known as: NORCO Take 1 tablet by mouth every 6 (six) hours as needed.   latanoprost 0.005 % ophthalmic solution Commonly known as: XALATAN INSTILL ONE DROP IN BOTH EYES NIGHTLY   levETIRAcetam 500 MG tablet Commonly known as: KEPPRA Take 500 mg by mouth 2 (two) times daily.   methocarbamol 500 MG tablet Commonly known as: ROBAXIN Take 1 tablet by mouth in the morning and at bedtime.    nitroGLYCERIN 0.4 MG  SL tablet Commonly known as: NITROSTAT Place 1 tablet (0.4 mg total) under the tongue every 5 (five) minutes x 3 doses as needed for chest pain.   pantoprazole 40 MG tablet Commonly known as: PROTONIX Take 40 mg by mouth daily.   potassium chloride 10 MEQ tablet Commonly known as: KLOR-CON Take 10 mEq by mouth daily.   predniSONE 50 MG tablet Commonly known as: DELTASONE Take 1 tablet (50 mg total) by mouth daily with breakfast.   risperiDONE 0.5 MG tablet Commonly known as: RISPERDAL Take 0.5 mg by mouth 2 (two) times daily.   SYSTANE BALANCE OP Place 1-2 drops into both eyes daily as needed (dry eyes).       Allergies  Allergen Reactions  . Lisinopril Swelling  . Eggs Or Egg-Derived Products Itching    Patient reports that she can eat eggs but does not tolerate egg derived products  . Morphine And Related     Headache  . Penicillins Itching    DID THE REACTION INVOLVE: Swelling of the face/tongue/throat, SOB, or low BP? No Sudden or severe rash/hives, skin peeling, or the inside of the mouth or nose? No Did it require medical treatment? No When did it last happen? Unknown If all above answers are "NO", may proceed with cephalosporin use.     Consultations:  Palliative medicine   Procedures/Studies: DG Elbow 2 Views Right  Result Date: 05/07/2020 CLINICAL DATA:  Arm pain with limited range of motion EXAM: RIGHT ELBOW - 2 VIEW COMPARISON:  None. FINDINGS: No fracture or malalignment. Suspected elbow effusion. There is soft tissue swelling. IMPRESSION: Probable elbow effusion.  No definite acute osseous abnormality. Electronically Signed   By: Jasmine Pang M.D.   On: 05/07/2020 18:18   DG Wrist 2 Views Right  Result Date: 05/07/2020 CLINICAL DATA:  Right wrist pain, no known injury, initial encounter EXAM: RIGHT WRIST - 2 VIEW COMPARISON:  None. FINDINGS: Degenerative changes of the first [facility] joint are noted. No acute fracture or dislocation  is noted. Mild osteopenia is seen. Some soft tissue swelling about the wrist is noted. IMPRESSION: Mild degenerative changes without acute abnormality. Electronically Signed   By: Alcide Clever M.D.   On: 05/07/2020 11:19   CT CHEST WO CONTRAST  Result Date: 05/09/2020 CLINICAL DATA:  Pneumonia, effusion, or abscess suspected.worsening white blood cell count. EXAM: CT CHEST WITHOUT CONTRAST TECHNIQUE: Multidetector CT imaging of the chest was performed following the standard protocol without IV contrast. COMPARISON:  CT chest 05/02/2020. chest x-ray 01/10/2019 FINDINGS: Cardiovascular: Normal heart size. No significant pericardial effusion. The thoracic aorta is normal in caliber. At least moderate atherosclerotic plaque of the thoracic aorta. At least 2 vessel coronary artery calcifications. Mitral annular calcifications. Mediastinum/Nodes: No gross hilar adenopathy, noting limited sensitivity for the detection of hilar adenopathy on this noncontrast study. Persistent enlarged mediastinal lymph nodes. Likely enlarged left hilar lymph node. No axillary lymph nodes. Thyroid gland, trachea, and esophagus demonstrate no significant findings. At least moderate volume hiatal hernia. Lungs/Pleura:Biapical pleural/pulmonary scarring. Interval increase in ground-glass airspace opacity within the left upper lobe measuring approximately 3.5 cm (4:49) as well as 1.5 cm (4:55). Question 0.9 cm ground-glass airspace opacity within the anterior subpleural right upper lobe (4:45). Diffuse bronchial wall thickening. Slightly decreased trace bilateral, right greater than left, pleural effusions. Right lower lobe passive atelectasis. Redemonstration of a cluster of pulmonary micronodules within the right middle lobe (4: 89-92). No pneumothorax. Upper Abdomen: No acute abnormality. Musculoskeletal: No chest wall abnormality. No  suspicious lytic or blastic osseous lesions. No acute displaced fracture. Multilevel degenerative changes  of the spine. Chronic, impacted sternal body fracture. Old healed rib fractures. Severe right shoulder degenerative changes with interval increase in periarticular fluid. Grade 1 anterolisthesis of T1 on T2 and T2 on T3. IMPRESSION: 1. Interval increase in ground-glass airspace opacities within the bilateral upper lobes suggestive of worsening infection/inflammation. 2. Severe right shoulder degenerative changes with interval increase in periarticular fluid which could represent bursitis versus joint effusion. Limited evaluation for infection on this nondedicated and noncontrast study. 3. Persistent right lower lobe consolidation. Underlying abscess formation or malignancy not excluded. Markedly limited evaluation on this noncontrast study. 4. Slightly decreased trace bilateral pleural effusions, right greater than left. 5. Persistent mediastinal and likely left hilar lymphadenopathy. Limited evaluation on this noncontrast study. Recommend attention on follow-up. 6. At least moderate volume hiatal hernia. 7. Aortic Atherosclerosis (ICD10-I70.0) including coronary artery and mitral annular calcifications. These results will be called to the ordering clinician or representative by the Radiologist Assistant, and communication documented in the PACS or [employer]. Electronically Signed   By: Tish Frederickson M.D.   On: 05/09/2020 06:11   [place] CHEST (PLEURAL EFFUSION)  Result Date: 05/07/2020 CLINICAL DATA:  RIGHT pleural effusion question sufficient for thoracentesis EXAM: CHEST ULTRASOUND COMPARISON:  CT chest 05/02/2020 FINDINGS: Minimal LEFT pleural effusion. Trace RIGHT pleural effusion. Insufficient RIGHT pleural fluid for diagnostic thoracentesis. IMPRESSION: Insufficient RIGHT pleural fluid for diagnostic thoracentesis. Electronically Signed   By: Ulyses Southward M.D.   On: 05/07/2020 15:08   CT CHEST ABDOMEN PELVIS W CONTRAST  Result Date: 05/02/2020 CLINICAL DATA:  Sepsis.  Community-acquired pneumonia.  EXAM: CT CHEST, ABDOMEN, AND PELVIS WITH CONTRAST TECHNIQUE: Multidetector CT imaging of the chest, abdomen and pelvis was performed following the standard protocol during bolus administration of intravenous contrast. CONTRAST:  OMNIPAQUE IOHEXOL 300 MG/ML  SOLN COMPARISON:  No comparison studies available. FINDINGS: CT CHEST FINDINGS Cardiovascular: The heart size is normal. No substantial pericardial effusion. Coronary artery calcification is evident. Mitral annular calcification noted. Atherosclerotic calcification is noted in the wall of the thoracic aorta. Mediastinum/Nodes: Mediastinal lymphadenopathy evident. 15 mm short axis precarinal node shows central necrosis. 2.3 cm short axis subcarinal lymph node evident. No left hilar lymphadenopathy. Soft tissue fullness noted in the right hilum. Moderate to large hiatal hernia. There is no axillary lymphadenopathy. Lungs/Pleura: Dense consolidative opacity noted in the right lower lobe with 3 cm central low-density lesion in the lung parenchyma. This is associated with mild left base collapse/consolidation, moderate right pleural effusion and small left pleural effusion. Patchy areas of peripheral airspace disease are noted in the left upper lobe. Musculoskeletal: Bones are diffusely demineralized age-indeterminate nondisplaced fractures of the anterior right third and fourth ribs noted. Degenerative changes are evident in both shoulders, right greater than left. CT ABDOMEN PELVIS FINDINGS Hepatobiliary: No suspicious focal abnormality within the liver parenchyma. Gallbladder is distended. No intrahepatic or extrahepatic biliary dilation. Pancreas: No focal mass lesion. No dilatation of the main duct. No intraparenchymal cyst. No peripancreatic edema. Spleen: No splenomegaly. No focal mass lesion. Adrenals/Urinary Tract: No adrenal nodule or mass. Kidneys unremarkable. No evidence for hydroureter. The urinary bladder appears normal for the degree of  distention. Stomach/Bowel: Moderate to large hiatal hernia Duodenum is normally positioned as is the ligament of Treitz. No small bowel wall thickening. No small bowel dilatation. The terminal ileum is normal. The appendix is not well visualized, but there is no edema or inflammation in the  region of the cecum. Mild wall thickening noted in the sigmoid colon, potentially related to underdistention although infectious/inflammatory colitis not excluded. Vascular/Lymphatic: There is abdominal aortic atherosclerosis without aneurysm. There is no gastrohepatic or hepatoduodenal ligament lymphadenopathy. No retroperitoneal or mesenteric lymphadenopathy. No pelvic sidewall lymphadenopathy. Reproductive: Uterus surgically absent.  There is no adnexal mass. Other: Trace free fluid seen in the pelvis and in the left paracolic gutter. Musculoskeletal: No worrisome lytic or sclerotic osseous abnormality. Advanced degenerative disc disease noted lumbar spine. IMPRESSION: 1. Dense consolidative opacity in the right lower lobe with 3 cm central low-density lesion in the lung parenchyma. This lesion may be related to necrotic pneumonia/pulmonary abscess although neoplasm not excluded. 2. Mediastinal and right hilar lymphadenopathy is. Subcarinal lymph node is somewhat more bulky than typically seen for reactive etiology and metastatic disease is a concern. 3. Patchy areas of peripheral airspace disease in the left upper lobe, likely infectious/inflammatory. 4. Moderate to large hiatal hernia. 5. Mild wall thickening in the sigmoid colon, potentially related to underdistention although infectious/inflammatory colitis not excluded. 6. Trace free fluid in the pelvis and left paracolic gutter. 7. Aortic Atherosclerosis (ICD10-I70.0). Electronically Signed   By: Kennith Center M.D.   On: 05/02/2020 14:21   US Venous Img Upper Uni Right(DVT)  Result Date: 05/08/2020 CLINICAL DATA:  87 year old female with right upper extremity pain and  limited range of motion. EXAM: RIGHT UPPER EXTREMITY VENOUS DOPPLER ULTRASOUND TECHNIQUE: Gray-scale sonography with graded compression, as well as color Doppler and duplex ultrasound were performed to evaluate the upper extremity deep venous system from the level of the subclavian vein and including the jugular, axillary, basilic, radial, ulnar and upper cephalic vein. Spectral Doppler was utilized to evaluate flow at rest and with distal augmentation maneuvers. COMPARISON:  None. FINDINGS: Contralateral Subclavian Vein: Respiratory phasicity is normal and symmetric with the symptomatic side. No evidence of thrombus. Normal compressibility. Internal Jugular Vein: No evidence of thrombus. Normal compressibility, respiratory phasicity and response to augmentation. Subclavian Vein: No evidence of thrombus. Normal compressibility, respiratory phasicity and response to augmentation. Axillary Vein: No evidence of thrombus. Normal compressibility, respiratory phasicity and response to augmentation. Cephalic Vein: No evidence of thrombus. Normal compressibility, respiratory phasicity and response to augmentation. Basilic Vein: No evidence of thrombus. Normal compressibility, respiratory phasicity and response to augmentation. Brachial Veins: No evidence of thrombus. Normal compressibility, respiratory phasicity and response to augmentation. Radial Veins: No evidence of thrombus. Normal compressibility, respiratory phasicity and response to augmentation. Ulnar Veins: No evidence of thrombus. Normal compressibility, respiratory phasicity and response to augmentation. Venous Reflux:  None visualized. Other Findings:  None visualized. IMPRESSION: No evidence of DVT within the right upper extremity. Electronically Signed   By: Malachy Moan M.D.   On: 05/08/2020 15:14   DG Chest Port 1 View  Result Date: 05/01/2020 CLINICAL DATA:  Sepsis. EXAM: PORTABLE CHEST 1 VIEW COMPARISON:  January 10, 2019. FINDINGS: Stable  cardiomediastinal silhouette. Stable hiatal hernia. No pneumothorax is noted. Left lung is clear. New large rounded opacity is noted in right lower lobe which may represent pneumonia, but neoplasm cannot be excluded. Mild right basilar subsegmental atelectasis and right pleural effusion is noted as well. Bony thorax is unremarkable. IMPRESSION: New large ill-defined rounded opacity is noted in right lower lobe which may represent pneumonia, but neoplasm cannot be excluded. CT scan of the chest with intravenous contrast is recommended for further evaluation. Aortic Atherosclerosis (ICD10-I70.0). Electronically Signed   By: Lupita Raider M.D.   On: 05/01/2020  12:56   DG Humerus Right  Result Date: 05/07/2020 CLINICAL DATA:  Arm pain EXAM: RIGHT HUMERUS - 2+ VIEW COMPARISON:  Shoulder radiograph 05/03/2019 FINDINGS: Moderate AC joint degenerative change and glenohumeral degenerative change. No fracture or malalignment. High-riding appearance of humeral head consistent with rotator cuff disease. Linear soft tissue calcifications in the region of the deltoid, possibly due to prior injury or dystrophic calcification. IMPRESSION: 1. No acute osseous abnormality 2. Arthritis of the [facility] joint and glenohumeral interval 3. High-riding appearance of humeral head consistent with rotator cuff disease. Electronically Signed   By: Jasmine Pang M.D.   On: 05/07/2020 18:21   DG HIPS BILAT WITH PELVIS MIN 5 VIEWS  Result Date: 05/01/2020 CLINICAL DATA:  Left hip pain after possible fall. EXAM: DG HIP (WITH OR WITHOUT PELVIS) 5+V BILAT COMPARISON:  None. FINDINGS: There is no evidence of hip fracture or dislocation. Moderate narrowing of the right hip joint is noted. IMPRESSION: Moderate osteoarthritis of the right hip. No acute abnormality is noted in either hip. Electronically Signed   By: Lupita Raider M.D.   On: 05/01/2020 12:59         Discharge Exam: Vitals:   05/09/20 0145 05/09/20 0534  BP: (!) 112/53 (!)  151/73  Pulse: 97 99  Resp: 15 17  Temp: 97.9 F (36.6 C) 98.5 F (36.9 C)  SpO2: 91% 95%   Vitals:   05/08/20 2056 05/09/20 0100 05/09/20 0145 05/09/20 0534  BP: (!) 143/68  (!) 112/53 (!) 151/73  Pulse: (!) 110  97 99  Resp:   15 17  Temp: (!) 102.3 F (39.1 C) 98.3 F (36.8 C) 97.9 F (36.6 C) 98.5 F (36.9 C)  TempSrc: Oral Oral Oral Oral  SpO2: 95%  91% 95%  Weight:      Height:        General: Pt is alert, awake, not in acute distress Cardiovascular: RRR, S1/S2 +, no rubs, no gallops Respiratory: bibasilar rales. No wheeze Abdominal: Soft, NT, ND, bowel sounds + Extremities: RUE edema.  Right wrist with erythema and synovitis   The results of significant diagnostics from this hospitalization (including imaging, microbiology, ancillary and laboratory) are listed below for reference.    Significant Diagnostic Studies: DG Elbow 2 Views Right  Result Date: 05/07/2020 CLINICAL DATA:  Arm pain with limited range of motion EXAM: RIGHT ELBOW - 2 VIEW COMPARISON:  None. FINDINGS: No fracture or malalignment. Suspected elbow effusion. There is soft tissue swelling. IMPRESSION: Probable elbow effusion.  No definite acute osseous abnormality. Electronically Signed   By: Jasmine Pang M.D.   On: 05/07/2020 18:18   DG Wrist 2 Views Right  Result Date: 05/07/2020 CLINICAL DATA:  Right wrist pain, no known injury, initial encounter EXAM: RIGHT WRIST - 2 VIEW COMPARISON:  None. FINDINGS: Degenerative changes of the first [facility] joint are noted. No acute fracture or dislocation is noted. Mild osteopenia is seen. Some soft tissue swelling about the wrist is noted. IMPRESSION: Mild degenerative changes without acute abnormality. Electronically Signed   By: Alcide Clever M.D.   On: 05/07/2020 11:19   CT CHEST WO CONTRAST  Result Date: 05/09/2020 CLINICAL DATA:  Pneumonia, effusion, or abscess suspected.worsening white blood cell count. EXAM: CT CHEST WITHOUT CONTRAST TECHNIQUE: Multidetector CT  imaging of the chest was performed following the standard protocol without IV contrast. COMPARISON:  CT chest 05/02/2020. chest x-ray 01/10/2019 FINDINGS: Cardiovascular: Normal heart size. No significant pericardial effusion. The thoracic aorta is normal in caliber.  At least moderate atherosclerotic plaque of the thoracic aorta. At least 2 vessel coronary artery calcifications. Mitral annular calcifications. Mediastinum/Nodes: No gross hilar adenopathy, noting limited sensitivity for the detection of hilar adenopathy on this noncontrast study. Persistent enlarged mediastinal lymph nodes. Likely enlarged left hilar lymph node. No axillary lymph nodes. Thyroid gland, trachea, and esophagus demonstrate no significant findings. At least moderate volume hiatal hernia. Lungs/Pleura:Biapical pleural/pulmonary scarring. Interval increase in ground-glass airspace opacity within the left upper lobe measuring approximately 3.5 cm (4:49) as well as 1.5 cm (4:55). Question 0.9 cm ground-glass airspace opacity within the anterior subpleural right upper lobe (4:45). Diffuse bronchial wall thickening. Slightly decreased trace bilateral, right greater than left, pleural effusions. Right lower lobe passive atelectasis. Redemonstration of a cluster of pulmonary micronodules within the right middle lobe (4: 89-92). No pneumothorax. Upper Abdomen: No acute abnormality. Musculoskeletal: No chest wall abnormality. No suspicious lytic or blastic osseous lesions. No acute displaced fracture. Multilevel degenerative changes of the spine. Chronic, impacted sternal body fracture. Old healed rib fractures. Severe right shoulder degenerative changes with interval increase in periarticular fluid. Grade 1 anterolisthesis of T1 on T2 and T2 on T3. IMPRESSION: 1. Interval increase in ground-glass airspace opacities within the bilateral upper lobes suggestive of worsening infection/inflammation. 2. Severe right shoulder degenerative changes with  interval increase in periarticular fluid which could represent bursitis versus joint effusion. Limited evaluation for infection on this nondedicated and noncontrast study. 3. Persistent right lower lobe consolidation. Underlying abscess formation or malignancy not excluded. Markedly limited evaluation on this noncontrast study. 4. Slightly decreased trace bilateral pleural effusions, right greater than left. 5. Persistent mediastinal and likely left hilar lymphadenopathy. Limited evaluation on this noncontrast study. Recommend attention on follow-up. 6. At least moderate volume hiatal hernia. 7. Aortic Atherosclerosis (ICD10-I70.0) including coronary artery and mitral annular calcifications. These results will be called to the ordering clinician or representative by the Radiologist Assistant, and communication documented in the PACS or [employer]. Electronically Signed   By: Tish Frederickson M.D.   On: 05/09/2020 06:11   [place] CHEST (PLEURAL EFFUSION)  Result Date: 05/07/2020 CLINICAL DATA:  RIGHT pleural effusion question sufficient for thoracentesis EXAM: CHEST ULTRASOUND COMPARISON:  CT chest 05/02/2020 FINDINGS: Minimal LEFT pleural effusion. Trace RIGHT pleural effusion. Insufficient RIGHT pleural fluid for diagnostic thoracentesis. IMPRESSION: Insufficient RIGHT pleural fluid for diagnostic thoracentesis. Electronically Signed   By: Ulyses Southward M.D.   On: 05/07/2020 15:08   CT CHEST ABDOMEN PELVIS W CONTRAST  Result Date: 05/02/2020 CLINICAL DATA:  Sepsis.  Community-acquired pneumonia. EXAM: CT CHEST, ABDOMEN, AND PELVIS WITH CONTRAST TECHNIQUE: Multidetector CT imaging of the chest, abdomen and pelvis was performed following the standard protocol during bolus administration of intravenous contrast. CONTRAST:  OMNIPAQUE IOHEXOL 300 MG/ML  SOLN COMPARISON:  No comparison studies available. FINDINGS: CT CHEST FINDINGS Cardiovascular: The heart size is normal. No substantial pericardial effusion.  Coronary artery calcification is evident. Mitral annular calcification noted. Atherosclerotic calcification is noted in the wall of the thoracic aorta. Mediastinum/Nodes: Mediastinal lymphadenopathy evident. 15 mm short axis precarinal node shows central necrosis. 2.3 cm short axis subcarinal lymph node evident. No left hilar lymphadenopathy. Soft tissue fullness noted in the right hilum. Moderate to large hiatal hernia. There is no axillary lymphadenopathy. Lungs/Pleura: Dense consolidative opacity noted in the right lower lobe with 3 cm central low-density lesion in the lung parenchyma. This is associated with mild left base collapse/consolidation, moderate right pleural effusion and small left pleural effusion. Patchy areas of peripheral  airspace disease are noted in the left upper lobe. Musculoskeletal: Bones are diffusely demineralized age-indeterminate nondisplaced fractures of the anterior right third and fourth ribs noted. Degenerative changes are evident in both shoulders, right greater than left. CT ABDOMEN PELVIS FINDINGS Hepatobiliary: No suspicious focal abnormality within the liver parenchyma. Gallbladder is distended. No intrahepatic or extrahepatic biliary dilation. Pancreas: No focal mass lesion. No dilatation of the main duct. No intraparenchymal cyst. No peripancreatic edema. Spleen: No splenomegaly. No focal mass lesion. Adrenals/Urinary Tract: No adrenal nodule or mass. Kidneys unremarkable. No evidence for hydroureter. The urinary bladder appears normal for the degree of distention. Stomach/Bowel: Moderate to large hiatal hernia Duodenum is normally positioned as is the ligament of Treitz. No small bowel wall thickening. No small bowel dilatation. The terminal ileum is normal. The appendix is not well visualized, but there is no edema or inflammation in the region of the cecum. Mild wall thickening noted in the sigmoid colon, potentially related to underdistention although  infectious/inflammatory colitis not excluded. Vascular/Lymphatic: There is abdominal aortic atherosclerosis without aneurysm. There is no gastrohepatic or hepatoduodenal ligament lymphadenopathy. No retroperitoneal or mesenteric lymphadenopathy. No pelvic sidewall lymphadenopathy. Reproductive: Uterus surgically absent.  There is no adnexal mass. Other: Trace free fluid seen in the pelvis and in the left paracolic gutter. Musculoskeletal: No worrisome lytic or sclerotic osseous abnormality. Advanced degenerative disc disease noted lumbar spine. IMPRESSION: 1. Dense consolidative opacity in the right lower lobe with 3 cm central low-density lesion in the lung parenchyma. This lesion may be related to necrotic pneumonia/pulmonary abscess although neoplasm not excluded. 2. Mediastinal and right hilar lymphadenopathy is. Subcarinal lymph node is somewhat more bulky than typically seen for reactive etiology and metastatic disease is a concern. 3. Patchy areas of peripheral airspace disease in the left upper lobe, likely infectious/inflammatory. 4. Moderate to large hiatal hernia. 5. Mild wall thickening in the sigmoid colon, potentially related to underdistention although infectious/inflammatory colitis not excluded. 6. Trace free fluid in the pelvis and left paracolic gutter. 7. Aortic Atherosclerosis (ICD10-I70.0). Electronically Signed   By: Kennith Center M.D.   On: 05/02/2020 14:21   US Venous Img Upper Uni Right(DVT)  Result Date: 05/08/2020 CLINICAL DATA:  87 year old female with right upper extremity pain and limited range of motion. EXAM: RIGHT UPPER EXTREMITY VENOUS DOPPLER ULTRASOUND TECHNIQUE: Gray-scale sonography with graded compression, as well as color Doppler and duplex ultrasound were performed to evaluate the upper extremity deep venous system from the level of the subclavian vein and including the jugular, axillary, basilic, radial, ulnar and upper cephalic vein. Spectral Doppler was utilized to  evaluate flow at rest and with distal augmentation maneuvers. COMPARISON:  None. FINDINGS: Contralateral Subclavian Vein: Respiratory phasicity is normal and symmetric with the symptomatic side. No evidence of thrombus. Normal compressibility. Internal Jugular Vein: No evidence of thrombus. Normal compressibility, respiratory phasicity and response to augmentation. Subclavian Vein: No evidence of thrombus. Normal compressibility, respiratory phasicity and response to augmentation. Axillary Vein: No evidence of thrombus. Normal compressibility, respiratory phasicity and response to augmentation. Cephalic Vein: No evidence of thrombus. Normal compressibility, respiratory phasicity and response to augmentation. Basilic Vein: No evidence of thrombus. Normal compressibility, respiratory phasicity and response to augmentation. Brachial Veins: No evidence of thrombus. Normal compressibility, respiratory phasicity and response to augmentation. Radial Veins: No evidence of thrombus. Normal compressibility, respiratory phasicity and response to augmentation. Ulnar Veins: No evidence of thrombus. Normal compressibility, respiratory phasicity and response to augmentation. Venous Reflux:  None visualized. Other Findings:  None visualized.  IMPRESSION: No evidence of DVT within the right upper extremity. Electronically Signed   By: Malachy MoanHeath  McCullough M.D.   On: 05/08/2020 15:14   DG Chest Port 1 View  Result Date: 05/01/2020 CLINICAL DATA:  Sepsis. EXAM: PORTABLE CHEST 1 VIEW COMPARISON:  January 10, 2019. FINDINGS: Stable cardiomediastinal silhouette. Stable hiatal hernia. No pneumothorax is noted. Left lung is clear. New large rounded opacity is noted in right lower lobe which may represent pneumonia, but neoplasm cannot be excluded. Mild right basilar subsegmental atelectasis and right pleural effusion is noted as well. Bony thorax is unremarkable. IMPRESSION: New large ill-defined rounded opacity is noted in right lower lobe  which may represent pneumonia, but neoplasm cannot be excluded. CT scan of the chest with intravenous contrast is recommended for further evaluation. Aortic Atherosclerosis (ICD10-I70.0). Electronically Signed   By: Lupita RaiderJames  Green Jr M.D.   On: 05/01/2020 12:56   DG Humerus Right  Result Date: 05/07/2020 CLINICAL DATA:  Arm pain EXAM: RIGHT HUMERUS - 2+ VIEW COMPARISON:  Shoulder radiograph 05/03/2019 FINDINGS: Moderate AC joint degenerative change and glenohumeral degenerative change. No fracture or malalignment. High-riding appearance of humeral head consistent with rotator cuff disease. Linear soft tissue calcifications in the region of the deltoid, possibly due to prior injury or dystrophic calcification. IMPRESSION: 1. No acute osseous abnormality 2. Arthritis of the [facility] joint and glenohumeral interval 3. High-riding appearance of humeral head consistent with rotator cuff disease. Electronically Signed   By: Jasmine PangKim  Fujinaga M.D.   On: 05/07/2020 18:21   DG HIPS BILAT WITH PELVIS MIN 5 VIEWS  Result Date: 05/01/2020 CLINICAL DATA:  Left hip pain after possible fall. EXAM: DG HIP (WITH OR WITHOUT PELVIS) 5+V BILAT COMPARISON:  None. FINDINGS: There is no evidence of hip fracture or dislocation. Moderate narrowing of the right hip joint is noted. IMPRESSION: Moderate osteoarthritis of the right hip. No acute abnormality is noted in either hip. Electronically Signed   By: Lupita RaiderJames  Green Jr M.D.   On: 05/01/2020 12:59     Microbiology: Recent Results (from the past 240 hour(s))  Blood Culture (routine x 2)     Status: None   Collection Time: 05/01/20 11:41 AM   Specimen: BLOOD  Result Value Ref Range Status   Specimen Description BLOOD RIGHT ARM  Final   Special Requests   Final    BOTTLES DRAWN AEROBIC AND ANAEROBIC Blood Culture adequate volume   Culture   Final    NO GROWTH 5 DAYS Performed at [facility], [address] [place], [place] [address]    Report Status 05/06/2020 FINAL  Final  Blood  Culture (routine x 2)     Status: Abnormal   Collection Time: 05/01/20 11:42 AM   Specimen: BLOOD  Result Value Ref Range Status   Specimen Description   Final    BLOOD LEFT ARM Performed at [facility], [address] [place], [place] [address]    Special Requests   Final    BOTTLES DRAWN AEROBIC AND ANAEROBIC Blood Culture adequate volume Performed at [facility], [address] [place], [place] [address]    Culture  Setup Time   Final    GRAM POSITIVE COCCI ANAEROBIC BOTTLE Gram Stain Report Called to,Read Back By and Verified With: HOWARDSON,M@1416  BY MATTHEWS, B 5.1.22 CRITICAL RESULT CALLED TO, READ BACK BY AND VERIFIED WITH: N ONDARA RN @2155  05/03/20 AH    Culture (A)  Final    STAPHYLOCOCCUS EPIDERMIDIS THE SIGNIFICANCE OF ISOLATING THIS ORGANISM FROM A  SINGLE SET OF BLOOD CULTURES WHEN MULTIPLE SETS ARE DRAWN IS UNCERTAIN. PLEASE NOTIFY THE MICROBIOLOGY DEPARTMENT WITHIN ONE WEEK IF SPECIATION AND SENSITIVITIES ARE REQUIRED. Performed at [facility], [address], [place], [place] [address]    Report Status 05/06/2020 FINAL  Final  Blood Culture ID Panel (Reflexed)     Status: Abnormal   Collection Time: 05/01/20 11:42 AM  Result Value Ref Range Status   Enterococcus faecalis NOT DETECTED NOT DETECTED Final   Enterococcus Faecium NOT DETECTED NOT DETECTED Final   Listeria monocytogenes NOT DETECTED NOT DETECTED Final   Staphylococcus species DETECTED (A) NOT DETECTED Final    Comment: CRITICAL RESULT CALLED TO, READ BACK BY AND VERIFIED WITH: NACKSON ONDARA,RN 05/03/2020 AT 2155 A.HUGHES    Staphylococcus aureus (BCID) NOT DETECTED NOT DETECTED Final   Staphylococcus epidermidis DETECTED (A) NOT DETECTED Final    Comment: Methicillin (oxacillin) resistant coagulase negative staphylococcus. Possible blood culture contaminant (unless isolated from more than one blood culture draw or clinical case suggests pathogenicity). No antibiotic treatment is indicated for  blood  culture contaminants. CRITICAL RESULT CALLED TO, READ BACK BY AND VERIFIED WITH: NACKSON ONDARA,RN 05/03/2020 AT 2155 A.HUGHES    Staphylococcus lugdunensis NOT DETECTED NOT DETECTED Final   Streptococcus species NOT DETECTED NOT DETECTED Final   Streptococcus agalactiae NOT DETECTED NOT DETECTED Final   Streptococcus pneumoniae NOT DETECTED NOT DETECTED Final   Streptococcus pyogenes NOT DETECTED NOT DETECTED Final   A.calcoaceticus-baumannii NOT DETECTED NOT DETECTED Final   Bacteroides fragilis NOT DETECTED NOT DETECTED Final   Enterobacterales NOT DETECTED NOT DETECTED Final   Enterobacter cloacae complex NOT DETECTED NOT DETECTED Final   Escherichia coli NOT DETECTED NOT DETECTED Final   Klebsiella aerogenes NOT DETECTED NOT DETECTED Final   Klebsiella oxytoca NOT DETECTED NOT DETECTED Final   Klebsiella pneumoniae NOT DETECTED NOT DETECTED Final   Proteus species NOT DETECTED NOT DETECTED Final   Salmonella species NOT DETECTED NOT DETECTED Final   Serratia marcescens NOT DETECTED NOT DETECTED Final   Haemophilus influenzae NOT DETECTED NOT DETECTED Final   Neisseria meningitidis NOT DETECTED NOT DETECTED Final   Pseudomonas aeruginosa NOT DETECTED NOT DETECTED Final   Stenotrophomonas maltophilia NOT DETECTED NOT DETECTED Final   Candida albicans NOT DETECTED NOT DETECTED Final   Candida auris NOT DETECTED NOT DETECTED Final   Candida glabrata NOT DETECTED NOT DETECTED Final   Candida krusei NOT DETECTED NOT DETECTED Final   Candida parapsilosis NOT DETECTED NOT DETECTED Final   Candida tropicalis NOT DETECTED NOT DETECTED Final   Cryptococcus neoformans/gattii NOT DETECTED NOT DETECTED Final   Methicillin resistance mecA/C DETECTED (A) NOT DETECTED Final    Comment: CRITICAL RESULT CALLED TO, READ BACK BY AND VERIFIED WITH: NACKSON ONDARA,RN 05/03/2020 AT 2155 A.HUGHES Performed at [facility], [address] [place], [place] [address]   Urine culture      Status: Abnormal   Collection Time: 05/01/20 12:15 PM   Specimen: Urine, Clean Catch  Result Value Ref Range Status   Specimen Description   Final    URINE, CLEAN CATCH Performed at [facility], [address], [place], [place] [address]    Special Requests   Final    NONE Performed at [facility], [address] [place], [place] [address]    Culture >=100,000 COLONIES/mL ESCHERICHIA COLI (A)  Final   Report Status 05/04/2020 FINAL  Final   Organism ID, Bacteria ESCHERICHIA COLI (A)  Final  Susceptibility   Escherichia coli - MIC*    AMPICILLIN >=32 RESISTANT Resistant     CEFAZOLIN <=4 SENSITIVE Sensitive     CEFEPIME <=0.12 SENSITIVE Sensitive     CEFTRIAXONE <=0.25 SENSITIVE Sensitive     CIPROFLOXACIN <=0.25 SENSITIVE Sensitive     GENTAMICIN >=16 RESISTANT Resistant     IMIPENEM <=0.25 SENSITIVE Sensitive     NITROFURANTOIN <=16 SENSITIVE Sensitive     TRIMETH/SULFA >=320 RESISTANT Resistant     AMPICILLIN/SULBACTAM >=32 RESISTANT Resistant     PIP/TAZO <=4 SENSITIVE Sensitive     * >=100,000 COLONIES/mL ESCHERICHIA COLI  Resp Panel by RT-PCR (Flu A&B, Covid) Nasopharyngeal Swab     Status: None   Collection Time: 05/01/20 12:15 PM   Specimen: Nasopharyngeal Swab; Nasopharyngeal(NP) swabs in vial transport medium  Result Value Ref Range Status   SARS Coronavirus 2 by RT PCR NEGATIVE NEGATIVE Final    Comment: (NOTE) SARS-CoV-2 target nucleic acids are NOT DETECTED.  The SARS-CoV-2 RNA is generally detectable in upper respiratory specimens during the acute phase of infection. The lowest concentration of SARS-CoV-2 viral copies this assay can detect is 138 copies/mL. A negative result does not preclude SARS-Cov-2 infection and should not be used as the sole basis for treatment or other patient management decisions. A negative result may occur with  improper specimen collection/handling, submission of specimen other than nasopharyngeal swab, presence of viral  mutation(s) within the areas targeted by this assay, and inadequate number of viral copies(<138 copies/mL). A negative result must be combined with clinical observations, patient history, and epidemiological information. The expected result is Negative.  Fact Sheet for Patients:  BloggerCourse.com  Fact Sheet for Healthcare Providers:  SeriousBroker.it  This test is no t yet approved or cleared by the [place] FDA and  has been authorized for detection and/or diagnosis of SARS-CoV-2 by FDA under an Emergency Use Authorization (EUA). This EUA will remain  in effect (meaning this test can be used) for the duration of the COVID-19 declaration under Section 564(b)(1) of the Act, 21 U.S.C.section 360bbb-3(b)(1), unless the authorization is terminated  or revoked sooner.       Influenza A by PCR NEGATIVE NEGATIVE Final   Influenza B by PCR NEGATIVE NEGATIVE Final    Comment: (NOTE) The Xpert Xpress SARS-CoV-2/FLU/RSV plus assay is intended as an aid in the diagnosis of influenza from Nasopharyngeal swab specimens and should not be used as a sole basis for treatment. Nasal washings and aspirates are unacceptable for Xpert Xpress SARS-CoV-2/FLU/RSV testing.  Fact Sheet for Patients: BloggerCourse.com  Fact Sheet for Healthcare Providers: SeriousBroker.it  This test is not yet approved or cleared by the [place] FDA and has been authorized for detection and/or diagnosis of SARS-CoV-2 by FDA under an Emergency Use Authorization (EUA). This EUA will remain in effect (meaning this test can be used) for the duration of the COVID-19 declaration under Section 564(b)(1) of the Act, 21 U.S.C. section 360bbb-3(b)(1), unless the authorization is terminated or revoked.  Performed at [facility], [address] [place], [place] [address]   C Difficile Quick Screen w PCR reflex      Status: None   Collection Time: 05/02/20  3:39 PM   Specimen: STOOL  Result Value Ref Range Status   C Diff antigen NEGATIVE NEGATIVE Final   C Diff toxin NEGATIVE NEGATIVE Final   C Diff interpretation No C. difficile detected.  Final    Comment: Performed at [facility], [address] [place], [place]  [address]  Culture, blood (x 2)     Status: None (Preliminary result)   Collection Time: 05/04/20  8:07 AM   Specimen: BLOOD  Result Value Ref Range Status   Specimen Description BLOOD LEFT ANTECUBITAL  Final   Special Requests   Final    BOTTLES DRAWN AEROBIC AND ANAEROBIC Blood Culture adequate volume   Culture   Final    NO GROWTH 4 DAYS Performed at [facility], [address], [place], [place] [address]    Report Status PENDING  Incomplete  Culture, blood (x 2)     Status: None (Preliminary result)   Collection Time: 05/04/20  8:09 AM   Specimen: BLOOD  Result Value Ref Range Status   Specimen Description BLOOD BLOOD LEFT HAND  Final   Special Requests   Final    BOTTLES DRAWN AEROBIC AND ANAEROBIC Blood Culture adequate volume   Culture   Final    NO GROWTH 4 DAYS Performed at [facility], [address] [place], [place] [address]    Report Status PENDING  Incomplete  MRSA PCR Screening     Status: Abnormal   Collection Time: 05/06/20 12:47 PM   Specimen: Nasal Mucosa; Nasopharyngeal  Result Value Ref Range Status   MRSA by PCR POSITIVE (A) NEGATIVE Final    Comment:        The GeneXpert MRSA Assay (FDA approved for NASAL specimens only), is one component of a comprehensive MRSA colonization surveillance program. It is not intended to diagnose MRSA infection nor to guide or monitor treatment for MRSA infections. RESULT CALLED TO, READ BACK BY AND VERIFIED WITH: DILDY,VAL RN  05/06/20 BY JONES,T Performed at [facility], [address] [place], [place] [address]      Labs: Basic Metabolic Panel: Recent Labs  Lab 05/04/20 [phone] 05/05/20 0514  05/06/20 0446 05/07/20 0559 05/08/20 0551 05/09/20 0545  NA 131* 132* 132* 130* 131* 130*  K 2.7* 4.1 4.1 3.7 3.0* 3.4*  CL 93* 98 96* 94* 97* 97*  CO2 GLUCOSE 111* 103* 89 118* 128* 120*  BUN 7* CREATININE 0.40* 0.41* 0.51 0.42* 0.43* 0.46  CALCIUM 8.3* 7.9* 8.3* 8.2* 8.0* 8.3*  MG 1.1*  --   --   --  1.2* 1.8  PHOS 2.1*  --   --   --   --   --    Liver Function Tests: Recent Labs  Lab 05/03/20 0455 05/04/20 0608  AST 19  --   ALT 12  --   ALKPHOS 109  --   BILITOT 0.7  --   PROT 6.1*  --   ALBUMIN 1.8* 2.0*   No results for input(s): LIPASE, AMYLASE in the last 168 hours. No results for input(s): AMMONIA in the last 168 hours. CBC: Recent Labs  Lab 05/04/20 0808 05/05/20 0514 05/05/20 0830 05/05/20 1553 05/06/20 0446 05/07/20 0559 05/08/20 0551 05/09/20 0545  WBC 15.9* 16.4*  --   --  19.7* 21.8* 22.4* 23.7*  NEUTROABS 13.1*  --   --   --   --   --   --   --   HGB 8.1* 6.4*   < > 9.9* 9.9* 9.7* 9.5* 8.7*  HCT 25.3* 21.1*   < > 30.6* 31.1* 31.5* 30.2* 28.0*  MCV 76.2* 77.0*  --   --  78.1* 80.6 79.7* 80.0  PLT 597* 510*  --   --  578* 401* 502*  478*   < > = values in this interval not displayed.   Cardiac Enzymes: No results for input(s): CKTOTAL, CKMB, CKMBINDEX, TROPONINI in the last 168 hours. BNP: Invalid input(s): POCBNP CBG: No results for input(s): GLUCAP in the last 168 hours.  Time coordinating discharge:  36 minutes  Signed:  Catarina Hartshorn, DO [facility] Pager: [phone] 05/09/2020, 10:11 AM

## 2020-05-09 NOTE — Progress Notes (Signed)
Pharmacy Antibiotic Note  Beverly [name] is a 87 y.o. female admitted on 05/01/2020 with pneumonia.  Pharmacy has been consulted for cefepime and vancomycin dosing. PCT 0.95, WBC increased, afebrile. CT chest shows underlying lung abscess versus mass. Plan for thoracenteses for fluid analysis.   Plan: Vancomycin 1250mg  IV loading dose, then 750mg  IV q24h  VT 15-86mcg/ml Continue cefepime 2gm iv q12h   Monitor labs, c/s, and levels as indicated  Height: 4\' 10"  (147.3 cm) Weight: 57.1 kg (125 lb 12.8 oz) IBW/kg (Calculated) : 40.9  Temp (24hrs), Avg:99.3 F (37.4 C), Min:97.9 F (36.6 C), Max:102.3 F (39.1 C)  Recent Labs  Lab 05/04/20 0809 05/05/20 0514 05/06/20 0446 05/07/20 0559 05/08/20 0551 05/09/20 0545  WBC  --  16.4* 19.7* 21.8* 22.4* 23.7*  CREATININE  --  0.41* 0.51 0.42* 0.43* 0.46  LATICACIDVEN 1.1  --   --   --   --   --     Estimated Creatinine Clearance: 38.5 mL/min (by C-G formula based on SCr of 0.46 mg/dL).    Allergies  Allergen Reactions  . Lisinopril Swelling  . Eggs Or Egg-Derived Products Itching    Patient reports that she can eat eggs but does not tolerate egg derived products  . Morphine And Related     Headache  . Penicillins Itching    DID THE REACTION INVOLVE: Swelling of the face/tongue/throat, SOB, or low BP? No Sudden or severe rash/hives, skin peeling, or the inside of the mouth or nose? No Did it require medical treatment? No When did it last happen? Unknown If all above answers are "NO", may proceed with cephalosporin use.     Antimicrobials this admission: 4/30 cefepime >>  5/4 Vancomycin >> 4/30 azithromycin >> 5/3 4/30 metronidazole >>  Microbiology results: 4/30 CDiff: negative  5/4 MRSA PCR is positive 4/30 BCx: gram + cocci 1 bottle  BCID- staph epi- contaminant . 5/2 BCX: ngtd 4/29 UCX: E.COli >100K CFU , s- cefazolin , cefepime, ceftriaxone, cipro  R: septra, amp, unasyn, gent 4/30 Resp Panel: negative   Thank you  for allowing pharmacy to be a part of this patient's care.  7/2, PharmD, MBA, BCGP Clinical Pharmacist  05/09/2020 7:34 AM

## 2020-05-09 NOTE — TOC Transition Note (Addendum)
Transition of Care [facility]) - CM/SW Discharge Note   Patient Details  Name: Beverly [name] MRN: [id number] Date of Birth: February 27, 1932  Transition of Care [facility]) CM/SW Contact:  Barry Brunner, LCSW Phone Number: 05/09/2020, 11:23 AM   Clinical Narrative:    CSW notified of patient's readiness for discharge home with hospice. CSW contacted [facility] hospice for referral. Inetta Fermo with [facility] hospice agreeable to provide hospice services and reported that the patient's daughter requested to continue to keep patient's own PCP and to be seen by hospice nurse on Monday 05/09. CSW completed med necessity. Nurse to call EMS. TOC signing off.  Addendum Patient's daughter requested resources for home care services. CSW provided resource list for [facility]. TOC signing off.  Final next level of care: Home w Hospice Care Barriers to Discharge: Barriers Resolved   Patient Goals and CMS Choice Patient states their goals for this hospitalization and ongoing recovery are:: Return home with hospice CMS Medicare.gov Compare Post Acute Care list provided to:: Patient Choice offered to / list presented to : Patient  Discharge Placement                Patient to be transferred to facility by: [facility] Name of family member notified: [name] Patient and family notified of of transfer: 05/09/20  Discharge Plan and Services                DME Arranged: N/A DME Agency: NA       HH Arranged: NA HH Agency: NA Date HH Agency Contacted: 05/04/20 Time HH Agency Contacted: 1201 Representative spoke with at [facility] Agency: Maralyn Sago  Social Determinants of Health (SDOH) Interventions     Readmission Risk Interventions Readmission Risk Prevention Plan 05/04/2020  Transportation Screening Complete  Home Care Screening Complete  Medication Review (RN CM) Complete  Some recent data might be hidden

## 2020-06-04 ENCOUNTER — Other Ambulatory Visit (HOSPITAL_COMMUNITY): Payer: Self-pay | Admitting: Radiology

## 2020-06-04 DIAGNOSIS — J45909 Unspecified asthma, uncomplicated: Secondary | ICD-10-CM

## 2020-08-24 ENCOUNTER — Encounter (INDEPENDENT_AMBULATORY_CARE_PROVIDER_SITE_OTHER): Payer: Medicare Other | Admitting: Ophthalmology

## 2020-09-03 ENCOUNTER — Encounter (INDEPENDENT_AMBULATORY_CARE_PROVIDER_SITE_OTHER): Payer: Self-pay | Admitting: Ophthalmology

## 2020-09-03 ENCOUNTER — Ambulatory Visit (INDEPENDENT_AMBULATORY_CARE_PROVIDER_SITE_OTHER): Payer: Medicare Other | Admitting: Ophthalmology

## 2020-09-03 ENCOUNTER — Other Ambulatory Visit: Payer: Self-pay

## 2020-09-03 DIAGNOSIS — H2512 Age-related nuclear cataract, left eye: Secondary | ICD-10-CM | POA: Diagnosis not present

## 2020-09-03 DIAGNOSIS — H353221 Exudative age-related macular degeneration, left eye, with active choroidal neovascularization: Secondary | ICD-10-CM

## 2020-09-03 DIAGNOSIS — D509 Iron deficiency anemia, unspecified: Secondary | ICD-10-CM | POA: Diagnosis not present

## 2020-09-03 DIAGNOSIS — I10 Essential (primary) hypertension: Secondary | ICD-10-CM | POA: Diagnosis not present

## 2020-09-03 DIAGNOSIS — H353212 Exudative age-related macular degeneration, right eye, with inactive choroidal neovascularization: Secondary | ICD-10-CM

## 2020-09-03 DIAGNOSIS — M6281 Muscle weakness (generalized): Secondary | ICD-10-CM | POA: Diagnosis not present

## 2020-09-03 DIAGNOSIS — K219 Gastro-esophageal reflux disease without esophagitis: Secondary | ICD-10-CM | POA: Diagnosis not present

## 2020-09-03 DIAGNOSIS — K582 Mixed irritable bowel syndrome: Secondary | ICD-10-CM | POA: Diagnosis not present

## 2020-09-03 DIAGNOSIS — E782 Mixed hyperlipidemia: Secondary | ICD-10-CM | POA: Diagnosis not present

## 2020-09-03 DIAGNOSIS — I2510 Atherosclerotic heart disease of native coronary artery without angina pectoris: Secondary | ICD-10-CM | POA: Diagnosis not present

## 2020-09-03 NOTE — Assessment & Plan Note (Signed)
Stable OD will observe 

## 2020-09-03 NOTE — Progress Notes (Signed)
09/03/2020     CHIEF COMPLAINT Patient presents for  Chief Complaint  Patient presents with   Retina Evaluation      HISTORY OF PRESENT ILLNESS: Beverly [name] is a 87 y.o. female who presents to the clinic today for:   HPI     Retina Evaluation   I, the attending physician,  performed the HPI with the patient and updated documentation appropriately.        Comments   5 months fu os oct, possible avastin OS (r/s from May). Pt states she had cataract sx in the right eye in February or March 2022, left eye was not done because pt was in hospital. Vision is stable from last visit, no new FOL or floaters, denies pain in eyes. Pt is using latanoprost qhs ou, pt uses systane ou prn.      Last edited by Edmon Crape, MD on 09/03/2020  2:25 PM.     Overall no interval change Referring physician: Benita Stabile, MD [address] [place],  [place] [address]  HISTORICAL INFORMATION:   Selected notes from the MEDICAL RECORD NUMBER    Lab Results  Component Value Date   HGBA1C 6.1 (H) 10/19/2012     CURRENT MEDICATIONS: Current Outpatient Medications (Ophthalmic Drugs)  Medication Sig   latanoprost (XALATAN) 0.005 % ophthalmic solution INSTILL ONE DROP IN BOTH EYES NIGHTLY   Propylene Glycol (SYSTANE BALANCE OP) Place 1-2 drops into both eyes daily as needed (dry eyes).   No current facility-administered medications for this visit. (Ophthalmic Drugs)   Current Outpatient Medications (Other)  Medication Sig   acetaminophen (TYLENOL) 500 MG tablet Take 1 tablet (500 mg total) by mouth as needed (no more than 3 gm / day).   ALPRAZolam (XANAX) 0.5 MG tablet Take 0.5 mg by mouth 3 (three) times daily as needed for anxiety.   amLODipine (NORVASC) 5 MG tablet Take 1 tablet (5 mg total) by mouth daily.   aspirin 81 MG chewable tablet Chew 1 tablet (81 mg total) by mouth daily.   atorvastatin (LIPITOR) 80 MG tablet Take 1 tablet (80 mg total) by mouth daily at 6 PM.    carvedilol (COREG) 6.25 MG tablet TAKE ONE TABLET (6.25MG  TOTAL) BY MOUTH TWO TIMES DAILY   cefdinir (OMNICEF) 300 MG capsule Take 1 capsule (300 mg total) by mouth every 12 (twelve) hours.   celecoxib (CELEBREX) 200 MG capsule Take 200 mg by mouth daily.   cholecalciferol (VITAMIN D) 1000 UNITS tablet Take 1,000 Units by mouth daily.    clotrimazole (MYCELEX) 10 MG troche Take 1 tablet (10 mg total) by mouth 5 (five) times daily.   doxycycline (VIBRA-TABS) 100 MG tablet Take 1 tablet (100 mg total) by mouth every 12 (twelve) hours.   fluticasone (FLONASE) 50 MCG/ACT nasal spray Place 1 spray into both nostrils daily.    HYDROcodone-acetaminophen (NORCO) 10-325 MG tablet Take 1 tablet by mouth every 6 (six) hours as needed.   levETIRAcetam (KEPPRA) 500 MG tablet Take 500 mg by mouth 2 (two) times daily.   methocarbamol (ROBAXIN) 500 MG tablet Take 1 tablet by mouth in the morning and at bedtime.    nitroGLYCERIN (NITROSTAT) 0.4 MG SL tablet Place 1 tablet (0.4 mg total) under the tongue every 5 (five) minutes x 3 doses as needed for chest pain.   pantoprazole (PROTONIX) 40 MG tablet Take 40 mg by mouth daily.   potassium chloride (K-DUR) 10 MEQ tablet Take 10 mEq by  mouth daily.    predniSONE (DELTASONE) 50 MG tablet Take 1 tablet (50 mg total) by mouth daily with breakfast.   risperiDONE (RISPERDAL) 0.5 MG tablet Take 0.5 mg by mouth 2 (two) times daily.   No current facility-administered medications for this visit. (Other)      REVIEW OF SYSTEMS:    ALLERGIES Allergies  Allergen Reactions   Lisinopril Swelling   Eggs Or Egg-Derived Products Itching    Patient reports that she can eat eggs but does not tolerate egg derived products   Morphine And Related     Headache   Penicillins Itching    DID THE REACTION INVOLVE: Swelling of the face/tongue/throat, SOB, or low BP? No Sudden or severe rash/hives, skin peeling, or the inside of the mouth or nose? No Did it require medical  treatment? No When did it last happen? Unknown If all above answers are "NO", may proceed with cephalosporin use.     PAST MEDICAL HISTORY Past Medical History:  Diagnosis Date   Anxiety    Arthritis    COPD (chronic obstructive pulmonary disease) (HCC)    Coronary artery disease    a. STEMI with cardiac arrest (polymorphic VT) s/p DES to RCA.   GERD (gastroesophageal reflux disease)    Headache(784.0)    Hearing loss, central    Left ear   Hematoma    Hypertension    Memory changes    Mild aortic stenosis    Mild carotid artery disease (HCC)    a. 1-39% by duplex 02/2018.   Seizures (HCC) 10/2012   No seizures, speech issues; after a fall, hitting head on left side   Past Surgical History:  Procedure Laterality Date   ABDOMINAL HYSTERECTOMY     APPENDECTOMY     CARDIAC CATHETERIZATION     15 yrs ago   CORONARY/GRAFT ACUTE MI REVASCULARIZATION N/A 01/10/2018   Procedure: Coronary/Graft Acute MI Revascularization;  Surgeon: Runell Gess, MD;  Location: [facility] INVASIVE CV LAB;  Service: Cardiovascular;  Laterality: N/A;   CRANIOTOMY Left 10/30/2012   Procedure: CRANIOTOMY HEMATOMA EVACUATION SUBDURAL;  Surgeon: Maeola Harman, MD;  Location: [facility] NEURO ORS;  Service: Neurosurgery;  Laterality: Left;  Left Craniotomy for evacuation of subdural hematoma   JOINT REPLACEMENT Bilateral    knees   LEFT HEART CATH AND CORONARY ANGIOGRAPHY N/A 01/10/2018   Procedure: LEFT HEART CATH AND CORONARY ANGIOGRAPHY;  Surgeon: Runell Gess, MD;  Location: [facility] INVASIVE CV LAB;  Service: Cardiovascular;  Laterality: N/A;    FAMILY HISTORY Family History  Problem Relation Age of Onset   Cancer Father    Ataxia Neg Hx    Chorea Neg Hx    Dementia Neg Hx    Mental retardation Neg Hx    Migraines Neg Hx    Multiple sclerosis Neg Hx    Neurofibromatosis Neg Hx    Neuropathy Neg Hx    Parkinsonism Neg Hx    Seizures Neg Hx    Stroke Neg Hx     SOCIAL HISTORY Social History   Tobacco  Use   Smoking status: Never   Smokeless tobacco: Never  Vaping Use   Vaping Use: Never used  Substance Use Topics   Alcohol use: No   Drug use: No         OPHTHALMIC EXAM:  Base Eye Exam     Visual Acuity (ETDRS)       Right Left   Dist cc CF at 5' CF at 3'  Correction: Glasses         Tonometry (Tonopen, 1:59 PM)       Right Left   Pressure 16 20         Pupils       Pupils Dark Light APD   Right PERRL 5 4 None   Left PERRL 5 4 None         Extraocular Movement       Right Left    Full Full         Neuro/Psych     Oriented x3: Yes   Mood/Affect: Normal         Dilation     Both eyes: 1.0% Mydriacyl, 2.5% Phenylephrine @ 1:59 PM           Slit Lamp and Fundus Exam     External Exam       Right Left   External Normal Normal         Slit Lamp Exam       Right Left   Lids/Lashes Normal Normal   Conjunctiva/Sclera White and quiet White and quiet   Cornea Clear Clear   Anterior Chamber Deep and quiet Deep and quiet   Iris Round and reactive Round and reactive   Lens Centered posterior chamber intraocular lens 3+ Nuclear sclerosis   Anterior Vitreous Normal Normal         Fundus Exam       Right Left   Posterior Vitreous Posterior vitreous detachment Posterior vitreous detachment   Disc Normal Normal   C/D Ratio 0.2 0.3   Macula Geographic atrophy, Advanced age related macular degeneration, no exudates, no macular thickening, Retinal pigment epithelial mottling Soft drusen,  Pigmented atrophy, with subfoveal white fibrotic disciform scar, no fluid, no hemorrhage,  in wheelchair, 20 diopter lens only   Vessels Normal Normal   Periphery Normal Normal            IMAGING AND PROCEDURES  Imaging and Procedures for 09/03/20  OCT, Retina - OU - Both Eyes       Right Eye Quality was good. Scan locations included subfoveal. Central Foveal Thickness: 312. Progression has been stable. Findings include abnormal foveal  contour, subretinal hyper-reflective material, intraretinal hyper-reflective material.   Left Eye Quality was good. Scan locations included subfoveal. Central Foveal Thickness: 482. Progression has been stable. Findings include abnormal foveal contour, cystoid macular edema, disciform scar, subretinal hyper-reflective material, intraretinal fluid.   Notes OD, disciform scar macula right eye with perifoveal CME yet stable overall.  OS with increased intraretinal fluid and active CNVM, at 2844-month follow-up.  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  No interval change in the last year.  Whether treated or not no change will observe thus  Age-related nuclear cataract of left eye Follow-up and consider cataract extraction should patient want brighter vision but yet poor chance of acuity improvement also can observe OS     ICD-10-CM   1. Exudative age-related macular degeneration of left eye with active choroidal neovascularization (HCC)  H35.3221 OCT, Retina - OU - Both Eyes    CANCELED: Intravitreal Injection, Pharmacologic Agent - OS - Left  Eye    2. 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 neovascularization (HCC)  H35.3212     3. Age-related nuclear cataract of left eye  H25.12       1.  Bilateral history of CNVM active, and OD is in active and OS chronically active.  At 5 months post last evaluation treatment left eye there is been no interval change today off therapy due to other medical illnesses.  Therefore we will simply observe the left eye.  2.  Patient says the option of cataract  extraction with intraocular displacement left eye.  However little chance of visual acuity improvement  3.  Patient scheduled to begin rehabilitation for strengthening general strengthening purposes.  Ophthalmic Meds Ordered this visit:  No orders of the defined types were placed in this encounter.      Return in about 6 months (around 03/03/2021) for dilate, COLOR FP, OCT.  There are no Patient Instructions on file for this visit.   Explained the diagnoses, plan, and follow up with the patient and they expressed understanding.  Patient expressed understanding of the importance of proper follow up care.   Alford Highland Abagael Kramm M.D. Diseases & Surgery of the Retina and Vitreous [facility] 09/03/20     Abbreviations: M myopia (nearsighted); A astigmatism; H hyperopia (farsighted); P presbyopia; Mrx spectacle prescription;  CTL contact lenses; OD right eye; OS left eye; OU both eyes  XT exotropia; ET esotropia; PEK punctate epithelial keratitis; PEE punctate epithelial erosions; DES dry eye syndrome; MGD meibomian gland dysfunction; ATs artificial tears; PFAT's preservative free artificial tears; NSC nuclear sclerotic cataract; PSC posterior subcapsular cataract; ERM epi-retinal membrane; PVD posterior vitreous detachment; RD retinal detachment; DM diabetes mellitus; DR diabetic retinopathy; NPDR non-proliferative diabetic retinopathy; PDR proliferative diabetic retinopathy; CSME clinically significant macular edema; DME diabetic macular edema; dbh dot blot hemorrhages; CWS cotton wool spot; POAG primary open angle glaucoma; C/D cup-to-disc ratio; HVF humphrey visual field; GVF goldmann visual field; OCT optical coherence tomography; IOP intraocular pressure; BRVO Branch retinal vein occlusion; CRVO central retinal vein occlusion; CRAO central retinal artery occlusion; BRAO branch retinal artery occlusion; RT retinal tear; SB scleral buckle; PPV pars plana vitrectomy; VH Vitreous  hemorrhage; PRP panretinal laser photocoagulation; IVK intravitreal kenalog; VMT vitreomacular traction; MH Macular hole;  NVD neovascularization of the disc; NVE neovascularization elsewhere; AREDS age related eye disease study; ARMD age related macular degeneration; POAG primary open angle glaucoma; EBMD epithelial/anterior basement membrane dystrophy; ACIOL anterior chamber intraocular lens; IOL intraocular lens; PCIOL posterior chamber intraocular lens; Phaco/IOL phacoemulsification with intraocular lens placement; PRK photorefractive keratectomy; LASIK laser assisted in situ keratomileusis; HTN hypertension; DM diabetes mellitus; COPD chronic obstructive pulmonary disease

## 2020-09-03 NOTE — Assessment & Plan Note (Signed)
OS chronic large disciform no interval change in the 5 months since last visit and treatment.  No interval change in the last year.  Whether treated or not no change will observe thus

## 2020-09-03 NOTE — Assessment & Plan Note (Signed)
Follow-up and consider cataract extraction should patient want brighter vision but yet poor chance of acuity improvement also can observe OS

## 2020-09-09 DIAGNOSIS — R410 Disorientation, unspecified: Secondary | ICD-10-CM | POA: Diagnosis not present

## 2020-09-09 DIAGNOSIS — N390 Urinary tract infection, site not specified: Secondary | ICD-10-CM | POA: Diagnosis not present

## 2020-09-09 DIAGNOSIS — Z791 Long term (current) use of non-steroidal anti-inflammatories (NSAID): Secondary | ICD-10-CM | POA: Diagnosis not present

## 2020-09-09 DIAGNOSIS — K219 Gastro-esophageal reflux disease without esophagitis: Secondary | ICD-10-CM | POA: Diagnosis not present

## 2020-09-09 DIAGNOSIS — M6281 Muscle weakness (generalized): Secondary | ICD-10-CM | POA: Diagnosis not present

## 2020-09-09 DIAGNOSIS — K582 Mixed irritable bowel syndrome: Secondary | ICD-10-CM | POA: Diagnosis not present

## 2020-09-09 DIAGNOSIS — E782 Mixed hyperlipidemia: Secondary | ICD-10-CM | POA: Diagnosis not present

## 2020-09-09 DIAGNOSIS — D509 Iron deficiency anemia, unspecified: Secondary | ICD-10-CM | POA: Diagnosis not present

## 2020-09-09 DIAGNOSIS — I2510 Atherosclerotic heart disease of native coronary artery without angina pectoris: Secondary | ICD-10-CM | POA: Diagnosis not present

## 2020-09-09 DIAGNOSIS — Z79891 Long term (current) use of opiate analgesic: Secondary | ICD-10-CM | POA: Diagnosis not present

## 2020-09-09 DIAGNOSIS — R5383 Other fatigue: Secondary | ICD-10-CM | POA: Diagnosis not present

## 2020-09-09 DIAGNOSIS — I10 Essential (primary) hypertension: Secondary | ICD-10-CM | POA: Diagnosis not present

## 2020-09-09 DIAGNOSIS — G4710 Hypersomnia, unspecified: Secondary | ICD-10-CM | POA: Diagnosis not present

## 2020-09-09 DIAGNOSIS — Z9181 History of falling: Secondary | ICD-10-CM | POA: Diagnosis not present

## 2020-09-11 DIAGNOSIS — Z791 Long term (current) use of non-steroidal anti-inflammatories (NSAID): Secondary | ICD-10-CM | POA: Diagnosis not present

## 2020-09-11 DIAGNOSIS — D509 Iron deficiency anemia, unspecified: Secondary | ICD-10-CM | POA: Diagnosis not present

## 2020-09-11 DIAGNOSIS — K219 Gastro-esophageal reflux disease without esophagitis: Secondary | ICD-10-CM | POA: Diagnosis not present

## 2020-09-11 DIAGNOSIS — E782 Mixed hyperlipidemia: Secondary | ICD-10-CM | POA: Diagnosis not present

## 2020-09-11 DIAGNOSIS — Z9181 History of falling: Secondary | ICD-10-CM | POA: Diagnosis not present

## 2020-09-11 DIAGNOSIS — K582 Mixed irritable bowel syndrome: Secondary | ICD-10-CM | POA: Diagnosis not present

## 2020-09-11 DIAGNOSIS — I2510 Atherosclerotic heart disease of native coronary artery without angina pectoris: Secondary | ICD-10-CM | POA: Diagnosis not present

## 2020-09-11 DIAGNOSIS — Z79891 Long term (current) use of opiate analgesic: Secondary | ICD-10-CM | POA: Diagnosis not present

## 2020-09-11 DIAGNOSIS — M6281 Muscle weakness (generalized): Secondary | ICD-10-CM | POA: Diagnosis not present

## 2020-09-11 DIAGNOSIS — I10 Essential (primary) hypertension: Secondary | ICD-10-CM | POA: Diagnosis not present

## 2020-09-14 DIAGNOSIS — Z79891 Long term (current) use of opiate analgesic: Secondary | ICD-10-CM | POA: Diagnosis not present

## 2020-09-14 DIAGNOSIS — E782 Mixed hyperlipidemia: Secondary | ICD-10-CM | POA: Diagnosis not present

## 2020-09-14 DIAGNOSIS — K219 Gastro-esophageal reflux disease without esophagitis: Secondary | ICD-10-CM | POA: Diagnosis not present

## 2020-09-14 DIAGNOSIS — Z791 Long term (current) use of non-steroidal anti-inflammatories (NSAID): Secondary | ICD-10-CM | POA: Diagnosis not present

## 2020-09-14 DIAGNOSIS — I2510 Atherosclerotic heart disease of native coronary artery without angina pectoris: Secondary | ICD-10-CM | POA: Diagnosis not present

## 2020-09-14 DIAGNOSIS — K582 Mixed irritable bowel syndrome: Secondary | ICD-10-CM | POA: Diagnosis not present

## 2020-09-14 DIAGNOSIS — D509 Iron deficiency anemia, unspecified: Secondary | ICD-10-CM | POA: Diagnosis not present

## 2020-09-14 DIAGNOSIS — I10 Essential (primary) hypertension: Secondary | ICD-10-CM | POA: Diagnosis not present

## 2020-09-14 DIAGNOSIS — M6281 Muscle weakness (generalized): Secondary | ICD-10-CM | POA: Diagnosis not present

## 2020-09-14 DIAGNOSIS — Z9181 History of falling: Secondary | ICD-10-CM | POA: Diagnosis not present

## 2020-09-15 DIAGNOSIS — J449 Chronic obstructive pulmonary disease, unspecified: Secondary | ICD-10-CM | POA: Diagnosis not present

## 2020-09-15 DIAGNOSIS — Z515 Encounter for palliative care: Secondary | ICD-10-CM | POA: Diagnosis not present

## 2020-09-16 DIAGNOSIS — K582 Mixed irritable bowel syndrome: Secondary | ICD-10-CM | POA: Diagnosis not present

## 2020-09-16 DIAGNOSIS — I10 Essential (primary) hypertension: Secondary | ICD-10-CM | POA: Diagnosis not present

## 2020-09-16 DIAGNOSIS — Z791 Long term (current) use of non-steroidal anti-inflammatories (NSAID): Secondary | ICD-10-CM | POA: Diagnosis not present

## 2020-09-16 DIAGNOSIS — M6281 Muscle weakness (generalized): Secondary | ICD-10-CM | POA: Diagnosis not present

## 2020-09-16 DIAGNOSIS — Z79891 Long term (current) use of opiate analgesic: Secondary | ICD-10-CM | POA: Diagnosis not present

## 2020-09-16 DIAGNOSIS — Z9181 History of falling: Secondary | ICD-10-CM | POA: Diagnosis not present

## 2020-09-16 DIAGNOSIS — K219 Gastro-esophageal reflux disease without esophagitis: Secondary | ICD-10-CM | POA: Diagnosis not present

## 2020-09-16 DIAGNOSIS — E782 Mixed hyperlipidemia: Secondary | ICD-10-CM | POA: Diagnosis not present

## 2020-09-16 DIAGNOSIS — I2510 Atherosclerotic heart disease of native coronary artery without angina pectoris: Secondary | ICD-10-CM | POA: Diagnosis not present

## 2020-09-16 DIAGNOSIS — D509 Iron deficiency anemia, unspecified: Secondary | ICD-10-CM | POA: Diagnosis not present

## 2020-09-17 DIAGNOSIS — I10 Essential (primary) hypertension: Secondary | ICD-10-CM | POA: Diagnosis not present

## 2020-09-17 DIAGNOSIS — B370 Candidal stomatitis: Secondary | ICD-10-CM | POA: Diagnosis not present

## 2020-09-17 DIAGNOSIS — Z9181 History of falling: Secondary | ICD-10-CM | POA: Diagnosis not present

## 2020-09-17 DIAGNOSIS — E782 Mixed hyperlipidemia: Secondary | ICD-10-CM | POA: Diagnosis not present

## 2020-09-17 DIAGNOSIS — Z79891 Long term (current) use of opiate analgesic: Secondary | ICD-10-CM | POA: Diagnosis not present

## 2020-09-17 DIAGNOSIS — Z791 Long term (current) use of non-steroidal anti-inflammatories (NSAID): Secondary | ICD-10-CM | POA: Diagnosis not present

## 2020-09-17 DIAGNOSIS — I2510 Atherosclerotic heart disease of native coronary artery without angina pectoris: Secondary | ICD-10-CM | POA: Diagnosis not present

## 2020-09-17 DIAGNOSIS — D509 Iron deficiency anemia, unspecified: Secondary | ICD-10-CM | POA: Diagnosis not present

## 2020-09-17 DIAGNOSIS — K582 Mixed irritable bowel syndrome: Secondary | ICD-10-CM | POA: Diagnosis not present

## 2020-09-17 DIAGNOSIS — M6281 Muscle weakness (generalized): Secondary | ICD-10-CM | POA: Diagnosis not present

## 2020-09-17 DIAGNOSIS — K219 Gastro-esophageal reflux disease without esophagitis: Secondary | ICD-10-CM | POA: Diagnosis not present

## 2020-09-18 DIAGNOSIS — Z79891 Long term (current) use of opiate analgesic: Secondary | ICD-10-CM | POA: Diagnosis not present

## 2020-09-18 DIAGNOSIS — I10 Essential (primary) hypertension: Secondary | ICD-10-CM | POA: Diagnosis not present

## 2020-09-18 DIAGNOSIS — K219 Gastro-esophageal reflux disease without esophagitis: Secondary | ICD-10-CM | POA: Diagnosis not present

## 2020-09-18 DIAGNOSIS — I2510 Atherosclerotic heart disease of native coronary artery without angina pectoris: Secondary | ICD-10-CM | POA: Diagnosis not present

## 2020-09-18 DIAGNOSIS — Z791 Long term (current) use of non-steroidal anti-inflammatories (NSAID): Secondary | ICD-10-CM | POA: Diagnosis not present

## 2020-09-18 DIAGNOSIS — D509 Iron deficiency anemia, unspecified: Secondary | ICD-10-CM | POA: Diagnosis not present

## 2020-09-18 DIAGNOSIS — M6281 Muscle weakness (generalized): Secondary | ICD-10-CM | POA: Diagnosis not present

## 2020-09-18 DIAGNOSIS — Z9181 History of falling: Secondary | ICD-10-CM | POA: Diagnosis not present

## 2020-09-18 DIAGNOSIS — K582 Mixed irritable bowel syndrome: Secondary | ICD-10-CM | POA: Diagnosis not present

## 2020-09-18 DIAGNOSIS — E782 Mixed hyperlipidemia: Secondary | ICD-10-CM | POA: Diagnosis not present

## 2020-09-22 DIAGNOSIS — I10 Essential (primary) hypertension: Secondary | ICD-10-CM | POA: Diagnosis not present

## 2020-09-22 DIAGNOSIS — Z79891 Long term (current) use of opiate analgesic: Secondary | ICD-10-CM | POA: Diagnosis not present

## 2020-09-22 DIAGNOSIS — I2510 Atherosclerotic heart disease of native coronary artery without angina pectoris: Secondary | ICD-10-CM | POA: Diagnosis not present

## 2020-09-22 DIAGNOSIS — E782 Mixed hyperlipidemia: Secondary | ICD-10-CM | POA: Diagnosis not present

## 2020-09-22 DIAGNOSIS — K219 Gastro-esophageal reflux disease without esophagitis: Secondary | ICD-10-CM | POA: Diagnosis not present

## 2020-09-22 DIAGNOSIS — M6281 Muscle weakness (generalized): Secondary | ICD-10-CM | POA: Diagnosis not present

## 2020-09-22 DIAGNOSIS — D509 Iron deficiency anemia, unspecified: Secondary | ICD-10-CM | POA: Diagnosis not present

## 2020-09-22 DIAGNOSIS — Z791 Long term (current) use of non-steroidal anti-inflammatories (NSAID): Secondary | ICD-10-CM | POA: Diagnosis not present

## 2020-09-22 DIAGNOSIS — Z9181 History of falling: Secondary | ICD-10-CM | POA: Diagnosis not present

## 2020-09-22 DIAGNOSIS — K582 Mixed irritable bowel syndrome: Secondary | ICD-10-CM | POA: Diagnosis not present

## 2020-09-23 DIAGNOSIS — E782 Mixed hyperlipidemia: Secondary | ICD-10-CM | POA: Diagnosis not present

## 2020-09-23 DIAGNOSIS — I10 Essential (primary) hypertension: Secondary | ICD-10-CM | POA: Diagnosis not present

## 2020-09-23 DIAGNOSIS — Z791 Long term (current) use of non-steroidal anti-inflammatories (NSAID): Secondary | ICD-10-CM | POA: Diagnosis not present

## 2020-09-23 DIAGNOSIS — K582 Mixed irritable bowel syndrome: Secondary | ICD-10-CM | POA: Diagnosis not present

## 2020-09-23 DIAGNOSIS — K219 Gastro-esophageal reflux disease without esophagitis: Secondary | ICD-10-CM | POA: Diagnosis not present

## 2020-09-23 DIAGNOSIS — Z9181 History of falling: Secondary | ICD-10-CM | POA: Diagnosis not present

## 2020-09-23 DIAGNOSIS — Z79891 Long term (current) use of opiate analgesic: Secondary | ICD-10-CM | POA: Diagnosis not present

## 2020-09-23 DIAGNOSIS — M6281 Muscle weakness (generalized): Secondary | ICD-10-CM | POA: Diagnosis not present

## 2020-09-23 DIAGNOSIS — I2510 Atherosclerotic heart disease of native coronary artery without angina pectoris: Secondary | ICD-10-CM | POA: Diagnosis not present

## 2020-09-23 DIAGNOSIS — D509 Iron deficiency anemia, unspecified: Secondary | ICD-10-CM | POA: Diagnosis not present

## 2020-09-24 DIAGNOSIS — E782 Mixed hyperlipidemia: Secondary | ICD-10-CM | POA: Diagnosis not present

## 2020-09-24 DIAGNOSIS — D509 Iron deficiency anemia, unspecified: Secondary | ICD-10-CM | POA: Diagnosis not present

## 2020-09-24 DIAGNOSIS — Z79891 Long term (current) use of opiate analgesic: Secondary | ICD-10-CM | POA: Diagnosis not present

## 2020-09-24 DIAGNOSIS — M6281 Muscle weakness (generalized): Secondary | ICD-10-CM | POA: Diagnosis not present

## 2020-09-24 DIAGNOSIS — Z9181 History of falling: Secondary | ICD-10-CM | POA: Diagnosis not present

## 2020-09-24 DIAGNOSIS — I10 Essential (primary) hypertension: Secondary | ICD-10-CM | POA: Diagnosis not present

## 2020-09-24 DIAGNOSIS — Z791 Long term (current) use of non-steroidal anti-inflammatories (NSAID): Secondary | ICD-10-CM | POA: Diagnosis not present

## 2020-09-24 DIAGNOSIS — K582 Mixed irritable bowel syndrome: Secondary | ICD-10-CM | POA: Diagnosis not present

## 2020-09-24 DIAGNOSIS — I2510 Atherosclerotic heart disease of native coronary artery without angina pectoris: Secondary | ICD-10-CM | POA: Diagnosis not present

## 2020-09-24 DIAGNOSIS — K219 Gastro-esophageal reflux disease without esophagitis: Secondary | ICD-10-CM | POA: Diagnosis not present

## 2020-09-25 DIAGNOSIS — Z79891 Long term (current) use of opiate analgesic: Secondary | ICD-10-CM | POA: Diagnosis not present

## 2020-09-25 DIAGNOSIS — Z9181 History of falling: Secondary | ICD-10-CM | POA: Diagnosis not present

## 2020-09-25 DIAGNOSIS — D509 Iron deficiency anemia, unspecified: Secondary | ICD-10-CM | POA: Diagnosis not present

## 2020-09-25 DIAGNOSIS — K582 Mixed irritable bowel syndrome: Secondary | ICD-10-CM | POA: Diagnosis not present

## 2020-09-25 DIAGNOSIS — I10 Essential (primary) hypertension: Secondary | ICD-10-CM | POA: Diagnosis not present

## 2020-09-25 DIAGNOSIS — E782 Mixed hyperlipidemia: Secondary | ICD-10-CM | POA: Diagnosis not present

## 2020-09-25 DIAGNOSIS — Z791 Long term (current) use of non-steroidal anti-inflammatories (NSAID): Secondary | ICD-10-CM | POA: Diagnosis not present

## 2020-09-25 DIAGNOSIS — K219 Gastro-esophageal reflux disease without esophagitis: Secondary | ICD-10-CM | POA: Diagnosis not present

## 2020-09-25 DIAGNOSIS — I2510 Atherosclerotic heart disease of native coronary artery without angina pectoris: Secondary | ICD-10-CM | POA: Diagnosis not present

## 2020-09-25 DIAGNOSIS — M6281 Muscle weakness (generalized): Secondary | ICD-10-CM | POA: Diagnosis not present

## 2020-09-29 DIAGNOSIS — I2510 Atherosclerotic heart disease of native coronary artery without angina pectoris: Secondary | ICD-10-CM | POA: Diagnosis not present

## 2020-09-29 DIAGNOSIS — Z791 Long term (current) use of non-steroidal anti-inflammatories (NSAID): Secondary | ICD-10-CM | POA: Diagnosis not present

## 2020-09-29 DIAGNOSIS — K582 Mixed irritable bowel syndrome: Secondary | ICD-10-CM | POA: Diagnosis not present

## 2020-09-29 DIAGNOSIS — Z79891 Long term (current) use of opiate analgesic: Secondary | ICD-10-CM | POA: Diagnosis not present

## 2020-09-29 DIAGNOSIS — D509 Iron deficiency anemia, unspecified: Secondary | ICD-10-CM | POA: Diagnosis not present

## 2020-09-29 DIAGNOSIS — I10 Essential (primary) hypertension: Secondary | ICD-10-CM | POA: Diagnosis not present

## 2020-09-29 DIAGNOSIS — K219 Gastro-esophageal reflux disease without esophagitis: Secondary | ICD-10-CM | POA: Diagnosis not present

## 2020-09-29 DIAGNOSIS — M6281 Muscle weakness (generalized): Secondary | ICD-10-CM | POA: Diagnosis not present

## 2020-09-29 DIAGNOSIS — Z9181 History of falling: Secondary | ICD-10-CM | POA: Diagnosis not present

## 2020-09-29 DIAGNOSIS — E782 Mixed hyperlipidemia: Secondary | ICD-10-CM | POA: Diagnosis not present

## 2020-10-05 DIAGNOSIS — I2510 Atherosclerotic heart disease of native coronary artery without angina pectoris: Secondary | ICD-10-CM | POA: Diagnosis not present

## 2020-10-05 DIAGNOSIS — Z79891 Long term (current) use of opiate analgesic: Secondary | ICD-10-CM | POA: Diagnosis not present

## 2020-10-05 DIAGNOSIS — Z9181 History of falling: Secondary | ICD-10-CM | POA: Diagnosis not present

## 2020-10-05 DIAGNOSIS — M6281 Muscle weakness (generalized): Secondary | ICD-10-CM | POA: Diagnosis not present

## 2020-10-05 DIAGNOSIS — K582 Mixed irritable bowel syndrome: Secondary | ICD-10-CM | POA: Diagnosis not present

## 2020-10-05 DIAGNOSIS — D509 Iron deficiency anemia, unspecified: Secondary | ICD-10-CM | POA: Diagnosis not present

## 2020-10-05 DIAGNOSIS — I10 Essential (primary) hypertension: Secondary | ICD-10-CM | POA: Diagnosis not present

## 2020-10-05 DIAGNOSIS — Z791 Long term (current) use of non-steroidal anti-inflammatories (NSAID): Secondary | ICD-10-CM | POA: Diagnosis not present

## 2020-10-05 DIAGNOSIS — E782 Mixed hyperlipidemia: Secondary | ICD-10-CM | POA: Diagnosis not present

## 2020-10-05 DIAGNOSIS — K219 Gastro-esophageal reflux disease without esophagitis: Secondary | ICD-10-CM | POA: Diagnosis not present

## 2020-10-07 DIAGNOSIS — D509 Iron deficiency anemia, unspecified: Secondary | ICD-10-CM | POA: Diagnosis not present

## 2020-10-07 DIAGNOSIS — I2510 Atherosclerotic heart disease of native coronary artery without angina pectoris: Secondary | ICD-10-CM | POA: Diagnosis not present

## 2020-10-07 DIAGNOSIS — E782 Mixed hyperlipidemia: Secondary | ICD-10-CM | POA: Diagnosis not present

## 2020-10-07 DIAGNOSIS — Z9181 History of falling: Secondary | ICD-10-CM | POA: Diagnosis not present

## 2020-10-07 DIAGNOSIS — I10 Essential (primary) hypertension: Secondary | ICD-10-CM | POA: Diagnosis not present

## 2020-10-07 DIAGNOSIS — K582 Mixed irritable bowel syndrome: Secondary | ICD-10-CM | POA: Diagnosis not present

## 2020-10-07 DIAGNOSIS — M6281 Muscle weakness (generalized): Secondary | ICD-10-CM | POA: Diagnosis not present

## 2020-10-07 DIAGNOSIS — Z79891 Long term (current) use of opiate analgesic: Secondary | ICD-10-CM | POA: Diagnosis not present

## 2020-10-07 DIAGNOSIS — Z791 Long term (current) use of non-steroidal anti-inflammatories (NSAID): Secondary | ICD-10-CM | POA: Diagnosis not present

## 2020-10-07 DIAGNOSIS — K219 Gastro-esophageal reflux disease without esophagitis: Secondary | ICD-10-CM | POA: Diagnosis not present

## 2020-10-09 DIAGNOSIS — N390 Urinary tract infection, site not specified: Secondary | ICD-10-CM | POA: Diagnosis not present

## 2020-10-13 DIAGNOSIS — M6281 Muscle weakness (generalized): Secondary | ICD-10-CM | POA: Diagnosis not present

## 2020-10-13 DIAGNOSIS — K219 Gastro-esophageal reflux disease without esophagitis: Secondary | ICD-10-CM | POA: Diagnosis not present

## 2020-10-13 DIAGNOSIS — E782 Mixed hyperlipidemia: Secondary | ICD-10-CM | POA: Diagnosis not present

## 2020-10-13 DIAGNOSIS — Z79891 Long term (current) use of opiate analgesic: Secondary | ICD-10-CM | POA: Diagnosis not present

## 2020-10-13 DIAGNOSIS — I10 Essential (primary) hypertension: Secondary | ICD-10-CM | POA: Diagnosis not present

## 2020-10-13 DIAGNOSIS — K582 Mixed irritable bowel syndrome: Secondary | ICD-10-CM | POA: Diagnosis not present

## 2020-10-13 DIAGNOSIS — I2510 Atherosclerotic heart disease of native coronary artery without angina pectoris: Secondary | ICD-10-CM | POA: Diagnosis not present

## 2020-10-13 DIAGNOSIS — Z791 Long term (current) use of non-steroidal anti-inflammatories (NSAID): Secondary | ICD-10-CM | POA: Diagnosis not present

## 2020-10-13 DIAGNOSIS — Z9181 History of falling: Secondary | ICD-10-CM | POA: Diagnosis not present

## 2020-10-13 DIAGNOSIS — D509 Iron deficiency anemia, unspecified: Secondary | ICD-10-CM | POA: Diagnosis not present

## 2020-10-15 DIAGNOSIS — E782 Mixed hyperlipidemia: Secondary | ICD-10-CM | POA: Diagnosis not present

## 2020-10-15 DIAGNOSIS — M6281 Muscle weakness (generalized): Secondary | ICD-10-CM | POA: Diagnosis not present

## 2020-10-15 DIAGNOSIS — I2510 Atherosclerotic heart disease of native coronary artery without angina pectoris: Secondary | ICD-10-CM | POA: Diagnosis not present

## 2020-10-15 DIAGNOSIS — K582 Mixed irritable bowel syndrome: Secondary | ICD-10-CM | POA: Diagnosis not present

## 2020-10-15 DIAGNOSIS — Z791 Long term (current) use of non-steroidal anti-inflammatories (NSAID): Secondary | ICD-10-CM | POA: Diagnosis not present

## 2020-10-15 DIAGNOSIS — Z79891 Long term (current) use of opiate analgesic: Secondary | ICD-10-CM | POA: Diagnosis not present

## 2020-10-15 DIAGNOSIS — Z9181 History of falling: Secondary | ICD-10-CM | POA: Diagnosis not present

## 2020-10-15 DIAGNOSIS — K219 Gastro-esophageal reflux disease without esophagitis: Secondary | ICD-10-CM | POA: Diagnosis not present

## 2020-10-15 DIAGNOSIS — D509 Iron deficiency anemia, unspecified: Secondary | ICD-10-CM | POA: Diagnosis not present

## 2020-10-15 DIAGNOSIS — I10 Essential (primary) hypertension: Secondary | ICD-10-CM | POA: Diagnosis not present

## 2020-10-19 DIAGNOSIS — Z515 Encounter for palliative care: Secondary | ICD-10-CM | POA: Diagnosis not present

## 2020-10-19 DIAGNOSIS — J449 Chronic obstructive pulmonary disease, unspecified: Secondary | ICD-10-CM | POA: Diagnosis not present

## 2020-11-05 DIAGNOSIS — N390 Urinary tract infection, site not specified: Secondary | ICD-10-CM | POA: Diagnosis not present

## 2020-11-10 DIAGNOSIS — D509 Iron deficiency anemia, unspecified: Secondary | ICD-10-CM | POA: Diagnosis not present

## 2020-11-12 DIAGNOSIS — E782 Mixed hyperlipidemia: Secondary | ICD-10-CM | POA: Diagnosis not present

## 2020-11-12 DIAGNOSIS — E875 Hyperkalemia: Secondary | ICD-10-CM | POA: Diagnosis not present

## 2020-11-12 DIAGNOSIS — I2510 Atherosclerotic heart disease of native coronary artery without angina pectoris: Secondary | ICD-10-CM | POA: Diagnosis not present

## 2020-11-12 DIAGNOSIS — K219 Gastro-esophageal reflux disease without esophagitis: Secondary | ICD-10-CM | POA: Diagnosis not present

## 2020-11-12 DIAGNOSIS — M6281 Muscle weakness (generalized): Secondary | ICD-10-CM | POA: Diagnosis not present

## 2020-11-12 DIAGNOSIS — I10 Essential (primary) hypertension: Secondary | ICD-10-CM | POA: Diagnosis not present

## 2020-11-12 DIAGNOSIS — D509 Iron deficiency anemia, unspecified: Secondary | ICD-10-CM | POA: Diagnosis not present

## 2020-11-12 DIAGNOSIS — K582 Mixed irritable bowel syndrome: Secondary | ICD-10-CM | POA: Diagnosis not present

## 2020-11-19 DIAGNOSIS — J449 Chronic obstructive pulmonary disease, unspecified: Secondary | ICD-10-CM | POA: Diagnosis not present

## 2020-11-19 DIAGNOSIS — Z515 Encounter for palliative care: Secondary | ICD-10-CM | POA: Diagnosis not present

## 2021-01-11 DIAGNOSIS — J449 Chronic obstructive pulmonary disease, unspecified: Secondary | ICD-10-CM | POA: Diagnosis not present

## 2021-01-11 DIAGNOSIS — Z515 Encounter for palliative care: Secondary | ICD-10-CM | POA: Diagnosis not present

## 2021-01-19 DIAGNOSIS — R350 Frequency of micturition: Secondary | ICD-10-CM | POA: Diagnosis not present

## 2021-02-03 DIAGNOSIS — N390 Urinary tract infection, site not specified: Secondary | ICD-10-CM | POA: Diagnosis not present

## 2021-02-03 DIAGNOSIS — I10 Essential (primary) hypertension: Secondary | ICD-10-CM | POA: Diagnosis not present

## 2021-02-03 DIAGNOSIS — E782 Mixed hyperlipidemia: Secondary | ICD-10-CM | POA: Diagnosis not present

## 2021-02-03 DIAGNOSIS — F03B18 Unspecified dementia, moderate, with other behavioral disturbance: Secondary | ICD-10-CM | POA: Diagnosis not present

## 2021-02-03 DIAGNOSIS — M6281 Muscle weakness (generalized): Secondary | ICD-10-CM | POA: Diagnosis not present

## 2021-02-03 DIAGNOSIS — K582 Mixed irritable bowel syndrome: Secondary | ICD-10-CM | POA: Diagnosis not present

## 2021-02-03 DIAGNOSIS — D509 Iron deficiency anemia, unspecified: Secondary | ICD-10-CM | POA: Diagnosis not present

## 2021-02-03 DIAGNOSIS — I2510 Atherosclerotic heart disease of native coronary artery without angina pectoris: Secondary | ICD-10-CM | POA: Diagnosis not present

## 2021-02-03 DIAGNOSIS — K219 Gastro-esophageal reflux disease without esophagitis: Secondary | ICD-10-CM | POA: Diagnosis not present

## 2021-03-02 ENCOUNTER — Encounter: Payer: Self-pay | Admitting: Neurology

## 2021-03-02 ENCOUNTER — Ambulatory Visit: Payer: Medicare Other | Admitting: Neurology

## 2021-03-02 ENCOUNTER — Other Ambulatory Visit: Payer: Self-pay

## 2021-03-02 VITALS — BP 143/82 | HR 93 | Ht 60.0 in | Wt 136.4 lb

## 2021-03-02 DIAGNOSIS — R443 Hallucinations, unspecified: Secondary | ICD-10-CM

## 2021-03-02 DIAGNOSIS — R413 Other amnesia: Secondary | ICD-10-CM

## 2021-03-02 DIAGNOSIS — I10 Essential (primary) hypertension: Secondary | ICD-10-CM | POA: Diagnosis not present

## 2021-03-02 DIAGNOSIS — Z79899 Other long term (current) drug therapy: Secondary | ICD-10-CM

## 2021-03-02 DIAGNOSIS — E782 Mixed hyperlipidemia: Secondary | ICD-10-CM | POA: Diagnosis not present

## 2021-03-02 NOTE — Patient Instructions (Addendum)
It was nice to see you both today.  I agree with you continuing the memory medication called memantine 5 mg twice daily at this time.  Unfortunately, I do not have a good solution for the hallucinations, I would recommend that you talk to your primary care about a referral to a geriatric psychiatrist for further input and help.  Please continue to provide supervision at home and be aware of the fall risk.  I would not recommend that you stay without supervision at any time.  I do believe that your hallucinations tie in with certain medications that you are on including hydrocodone, Xanax, and Robaxin.  Please follow-up closely with your primary care.  We can see you back in this clinic on an as-needed basis.  Recurrent urinary tract infections can also contribute to worsening hallucinations and confusion as you know.  Please try to hydrate really well with water, I would recommend at least 6 cups of water per day.

## 2021-03-02 NOTE — Progress Notes (Signed)
Subjective:    Patient ID: Beverly [name] is a 88 y.o. female.  HPI    Interim history:   Ms. Beverly [name] is an 88 year old right-handed woman with an underlying complex medical history of subdural hematoma with status post craniotomy in 2014, recurrent falls, seizure disorder on Keppra (had seen Dr. Tomi Likens at [facility] neurology in 2017), hypertension, carotid artery disease, aortic stenosis, hearing loss, reflux disease, COPD, arthritis, and anxiety, who presents for re-evaluation of her memory loss associated with hallucinations.  The patient is accompanied by her daughter. The patient is referred back by her primary care physician, Dr. Nevada Crane.  I first met her at the request of her primary care nurse practitioner on 07/24/2019, at which time her daughter reported a history of memory loss and an approximately 74-monthhistory of hallucinations and delusions.  Of note, the patient was on multiple centrally acting medications at the time including Xanax, gabapentin, hydrocodone, Robaxin, Keppra, sertraline.  Her MMSE was 26 out of 30 at the time.  She had tried Seroquel in the past she reported trouble sleeping and I suggested a cautious trial of melatonin.  I suggested we proceed with a brain MRI as well as EEG.  She had a EEG on 08/26/2019 and I reviewed the results:  CONCLUSION: This is a mild abnormal awake EEG.  There is evidence of mild background slowing, consistent with mild bihemispheric malfunction, in addition, there is evidence of left parietal region irritability.   We called her daughter with the results.  She had a brain MRI without contrast on 08/18/2019 and I reviewed the results:   IMPRESSION: This MRI of the brain without contrast shows the following: 1.    Mild generalized cortical atrophy a little more pronounced in the medial temporal lobes. 2.    Some scattered T2/FLAIR hyperintense foci in the subcortical and deep white matter consistent with minimal chronic microvascular ischemic  change, normal for age. 3.    Remote burr hole surgery in the left frontal convexity.   4.    Degenerative spine changes as detailed above with anterolisthesis at C3-C4.   5.    No acute findings.   I reviewed Dr. HJuel Burrowoffice note from 02/03/2021.  The patient was recently treated for UTI with antibiotics and had worsening confusion and hallucinations.  She was felt to be more depressed.  She has been on memantine 5 mg twice daily.  She also takes alprazolam 0.5 mg 3 times daily as needed, she is on hydrocodone 10 mg strength 1 pill every 4 hours, Keppra 500 mg twice daily, methocarbamol 500 mg 4 times daily, risperidone 0.5 mg twice daily, sertraline currently 50 mg daily, which was recently reduced from 100 mg daily.  Today, 03/02/2021: Patient reports very little, daughter provides history and reports no recent seizure-like activity, she has had no confusion at night, she has had recurrent urinary tract infections.  She is taking hydrocodone about 5 times a day, Xanax half a pill 3 times a day and 1 pill at bedtime, Robaxin is 500 mg twice daily.  Patient's daughter reports that she is not convinced that patient can go without her pain medication.  She had evaluation through palliative care last year, there was a suspicion for lung cancer but this was not verified.  Daughter works as a h[employer] park manager she has sitters come in as needed if she or her husband cannot be there to supervise the patient.  Patient is not able to walk without assistance, she has a  walker and only walks when someone is around.  She has fallen but not recently thankfully. She hydrates fairly, daughter estimates that she drinks about 32 ounces of water.  The patient's allergies, current medications, family history, past medical history, past social history, past surgical history and problem list were reviewed and updated as appropriate.   Previously:   07/24/19: (She) reports problems sleeping at night, chronic pain, occasionally  seeing things and hearing things that are not there.  The patient is very hard of hearing.  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  Her history is primarily provided by her daughter.  The daughter reports that for the past 6+ months the patient has had visual hallucinations and also some delusions, being very convinced that what she sees is real.  She has also heard voices and noises that are not audible to her family.  Patient's daughter and daughter's husband live with her, patient lost her husband in June 2020.  Her son also stays with her during the day but works from home so he is not necessarily immediately around her. Of note, she is on multiple medications including high risk medications including a combination of narcotic pain medication and benzodiazepine medication.  She is followed by [facility] medical for pain management and is on Norco 1 pill every 4 hours, around-the-clock and takes Xanax only if needed at this time.  She also takes Robaxin for back pain.  In addition, she is on sertraline and gabapentin, she also takes Keppra twice daily and has been on it for over 5 years.  She has been on narcotic pain medication and benzodiazepine medication for many years.  Patient is a retired Marine scientist.  Her mother may have had dementia, mom lived to be 78 and her father lived to be 36 and died from lung cancer.  Patient is a non-smoker and drinks very little caffeine, not a whole lot of water, some flavored water during the day.  She has fallen multiple times.  She has a 2 wheeled walker at the house and is currently situated in a wheelchair.   I reviewed your office note from 07/08/2016.  She has no been sleeping well, was recently on Seroquel which was stopped.  Her daughter was concerned about worsening symptoms at night including night terrors.  Patient has had hallucinations and delusions.  She has had recent falls.  She had a head CT and cervical spine CT without contrast on  05/03/2019 and I reviewed the results: IMPRESSION: 1. No acute intracranial pathology. Small-vessel white matter disease.   2.  Soft tissue contusion of the right frontal scalp.   3.  No fracture or static subluxation of the cervical spine.   4. Severe multilevel disc degenerative disease and osteophytosis of cervical spine notable for degenerative anterolisthesis of C3 on C4, unchanged compared to prior examination.   She has severe arthritis, she is status post bilateral knee replacement surgeries.  She also needs cataract surgeries.  She was also recently diagnosed with macular degeneration.   Her Past Medical History Is Significant For: Past Medical History:  Diagnosis Date   Anxiety    Arthritis    COPD (chronic obstructive pulmonary disease) ([place])    Coronary artery disease    a. STEMI with cardiac arrest (polymorphic VT) s/p DES to RCA.   GERD (gastroesophageal reflux disease)    Headache(784.0)    Hearing loss, central    Left ear   Hematoma  Hypertension    Memory changes    Mild aortic stenosis    Mild carotid artery disease (HCC)    a. 1-39% by duplex 02/2018.   Seizures ([place]) 10/2012   No seizures, speech issues; after a fall, hitting head on left side   UTI (urinary tract infection)    June 2022    Her Past Surgical History Is Significant For: Past Surgical History:  Procedure Laterality Date   ABDOMINAL HYSTERECTOMY     APPENDECTOMY     CARDIAC CATHETERIZATION     15 yrs ago   CORONARY/GRAFT ACUTE MI REVASCULARIZATION N/A 01/10/2018   Procedure: Coronary/Graft Acute MI Revascularization;  Surgeon: Lorretta Harp, MD;  Location: [facility];  Service: Cardiovascular;  Laterality: N/A;   CRANIOTOMY Left 10/30/2012   Procedure: CRANIOTOMY HEMATOMA EVACUATION SUBDURAL;  Surgeon: Erline Levine, MD;  Location: [facility];  Service: Neurosurgery;  Laterality: Left;  Left Craniotomy for evacuation of subdural hematoma   JOINT REPLACEMENT Bilateral     knees   LEFT HEART CATH AND CORONARY ANGIOGRAPHY N/A 01/10/2018   Procedure: LEFT HEART CATH AND CORONARY ANGIOGRAPHY;  Surgeon: Lorretta Harp, MD;  Location: [facility];  Service: Cardiovascular;  Laterality: N/A;    Her Family History Is Significant For: Family History  Problem Relation Age of Onset   Dementia Mother    Cancer Father    Dementia Maternal Aunt    Ataxia Neg Hx    Chorea Neg Hx    Mental retardation Neg Hx    Migraines Neg Hx    Multiple sclerosis Neg Hx    Neurofibromatosis Neg Hx    Neuropathy Neg Hx    Parkinsonism Neg Hx    Seizures Neg Hx    Stroke Neg Hx     Her Social History Is Significant For: Social History   Socioeconomic History   Marital status: Married    Spouse name: Not on file   Number of children: Not on file   Years of education: Not on file   Highest education level: Not on file  Occupational History   Not on file  Tobacco Use   Smoking status: Never   Smokeless tobacco: Never  Vaping Use   Vaping Use: Never used  Substance and Sexual Activity   Alcohol use: No   Drug use: No   Sexual activity: Not on file  Other Topics Concern   Not on file  Social History Narrative   Not on file   Social Determinants of Health   Financial Resource Strain: Not on file  Food Insecurity: Not on file  Transportation Needs: Not on file  Physical Activity: Not on file  Stress: Not on file  Social Connections: Not on file    Her Allergies Are:  Allergies  Allergen Reactions   Lisinopril Swelling   Eggs Or Egg-Derived Products Itching    Patient reports that she can eat eggs but does not tolerate egg derived products   Morphine And Related     Headache   Penicillins Itching    DID THE REACTION INVOLVE: Swelling of the face/tongue/throat, SOB, or low BP? No Sudden or severe rash/hives, skin peeling, or the inside of the mouth or nose? No Did it require medical treatment? No When did it last happen? Unknown If all above  answers are NO, may proceed with cephalosporin use.   :   Her Current Medications Are:  Outpatient Encounter Medications as of 03/02/2021  Medication Sig  acetaminophen (TYLENOL) 500 MG tablet Take 1 tablet (500 mg total) by mouth as needed (no more than 3 gm / day).   ALPRAZolam (XANAX) 0.5 MG tablet Take 0.5 mg by mouth 3 (three) times daily as needed for anxiety.   aspirin 81 MG chewable tablet Chew 1 tablet (81 mg total) by mouth daily.   atorvastatin (LIPITOR) 80 MG tablet Take 1 tablet (80 mg total) by mouth daily at 6 PM.   carvedilol (COREG) 6.25 MG tablet TAKE ONE TABLET (6.25MG TOTAL) BY MOUTH TWO TIMES DAILY   cefdinir (OMNICEF) 300 MG capsule Take 1 capsule (300 mg total) by mouth every 12 (twelve) hours.   celecoxib (CELEBREX) 200 MG capsule Take 200 mg by mouth daily.   cholecalciferol (VITAMIN D) 1000 UNITS tablet Take 1,000 Units by mouth daily.    clotrimazole (MYCELEX) 10 MG troche Take 1 tablet (10 mg total) by mouth 5 (five) times daily.   dicyclomine (BENTYL) 20 MG tablet Take 20 mg by mouth in the morning and at bedtime.   doxycycline (VIBRA-TABS) 100 MG tablet Take 1 tablet (100 mg total) by mouth every 12 (twelve) hours.   fluticasone (FLONASE) 50 MCG/ACT nasal spray Place 1 spray into both nostrils daily.    HYDROcodone-acetaminophen (NORCO) 10-325 MG tablet Take 1 tablet by mouth every 6 (six) hours as needed.   latanoprost (XALATAN) 0.005 % ophthalmic solution INSTILL ONE DROP IN BOTH EYES NIGHTLY   levETIRAcetam (KEPPRA) 500 MG tablet Take 500 mg by mouth 2 (two) times daily.   memantine (NAMENDA) 5 MG tablet Take 5 mg by mouth 2 (two) times daily.   methocarbamol (ROBAXIN) 500 MG tablet Take 1 tablet by mouth in the morning and at bedtime.    nitroGLYCERIN (NITROSTAT) 0.4 MG SL tablet Place 1 tablet (0.4 mg total) under the tongue every 5 (five) minutes x 3 doses as needed for chest pain.   pantoprazole (PROTONIX) 40 MG tablet Take 40 mg by mouth daily.    predniSONE (DELTASONE) 50 MG tablet Take 1 tablet (50 mg total) by mouth daily with breakfast. (Patient taking differently: Take 50 mg by mouth as needed.)   Propylene Glycol (SYSTANE BALANCE OP) Place 1-2 drops into both eyes daily as needed (dry eyes).   risperiDONE (RISPERDAL) 0.5 MG tablet Take 0.5 mg by mouth 2 (two) times daily.   amLODipine (NORVASC) 5 MG tablet Take 1 tablet (5 mg total) by mouth daily.   potassium chloride (K-DUR) 10 MEQ tablet Take 10 mEq by mouth daily.    [DISCONTINUED] lisinopril (ZESTRIL) 20 MG tablet Take 1 tablet (20 mg total) by mouth 2 (two) times daily.   No facility-administered encounter medications on file as of 03/02/2021.  :  Review of Systems:  Out of a complete 14 point review of systems, all are reviewed and negative with the exception of these symptoms as listed below:  Review of Systems  Neurological:        Pt is here for worsening hallucinations. Daughter states patients short term memory is good . Her long term memory is worse. . Hallucinations is all the time    Objective:  Neurological Exam  Physical Exam Physical Examination:   Vitals:   03/02/21 1048  BP: (!) 143/82  Pulse: 93    General Examination: The patient is a very pleasant 88 y.o. female in no acute distress. She appears frail, in a wheelchair.  Well-groomed.  Minimally verbal.  HEENT: Normocephalic, atraumatic, pupils are equal, round and reactive to  light and accommodation.  Evidence of cataracts, tracking is preserved, hearing is impaired.  She had a stooped neck and right tilt in her neck.   Face is symmetric, no dysarthria noted, airway examination reveals moderate mouth dryness, tongue protrudes centrally and palate elevates symmetrically.  Minimally verbal.   Chest: Clear to auscultation without wheezing, rhonchi or crackles noted.   Heart: S1+S2+0, regular and normal without murmurs, rubs or gallops noted.    Abdomen: Soft, non-tender and non-distended.    Extremities: There is no obvious pitting edema.     Skin: Warm and dry without trophic changes noted.    Musculoskeletal: exam reveals prominent arthritic changes in both hands. Increased curvature noted in her back, situated in a wheelchair.   Neurologically:  Mental status: The patient is awake, cooperative with the exam.     On 07/24/2019: CDT: 2/4, AFT: 8/min.  MMSE - Mini Mental State Exam 03/02/2021 07/24/2019  Orientation to time 2 4  Orientation to Place 2 4  Registration 3 3  Attention/ Calculation 0 5  Recall 2 3  Language- name 2 objects 2 2  Language- repeat 1 1  Language- follow 3 step command 3 2  Language- read & follow direction 0 1  Write a sentence 0 0  Copy design 0 1  Total score 15 26    On 03/02/2021: CDT: 1/4, AFT: 6/min.   There is no evidence of aphasia, agnosia, apraxia or anomia. Speech is clear with normal prosody and enunciation. Thought process is linear. Mood is normal and affect is normal.  Cranial nerves II - XII are as described above under HEENT exam.    Motor exam: Thin muscle bulk, global strength of 4 out of 5, fine motor skills and coordination: Globally mildly impaired, no lateralization noted.    Cerebellar testing: No obvious intention tremor.    Sensory exam: intact to light touch in the upper and lower extremities.  Gait, station and balance: I did not have her stand or walk for me today.  She is in a wheelchair.    Assessment and Plan:    In summary, THRESA DOZIER is an 88 year old female with an underlying complex medical history of subdural hematoma with status post craniotomy in 2014, recurrent falls, seizure disorder on Keppra (had seen Dr. Tomi Likens at [facility] neurology in 2017), hypertension, carotid artery disease, aortic stenosis, hearing loss, reflux disease, COPD, arthritis, and anxiety, who presents for reevaluation of her memory loss.  She has had prominent hallucinations for the past several years., I do agree with maintaining  her on Namenda at this time, she can continue with the current dose of 5 mg twice daily for now.I do believe that her hallucinations are at least in part secondary to multiple psychotropic and high risk centrally acting medications.we talked about the importance of supportive care, and the importance of fall prevention.  She is advised to have 24-7 supervision.  She is advised to try to hydrate better with water.  She is advised to talk to her primary care about the possibility of adjusting her centrally acting medications.  She may benefit from evaluation through a geriatric psychiatrist.  We evaluated her with an EEG and a brain MRI in 2021.  There were no acute findings.  Recurrent urinary tract infections are likely to contribute to worsening hallucinations and confusion at least on a temporary basis.  I suggested follow-up closely with primary care.  She can be seen in this clinic on an as-needed  basis.  I answered all the questions today and the patient and her daughter were in agreement. I spent 40 minutes in total face-to-face time and in reviewing records during pre-charting, more than 50% of which was spent in counseling and coordination of care, reviewing test results, reviewing medications and treatment regimen and/or in discussing or reviewing the diagnosis of memory loss, the prognosis and treatment options. Pertinent laboratory and imaging test results that were available during this visit with the patient were reviewed by me and considered in my medical decision making (see chart for details).

## 2021-03-04 ENCOUNTER — Encounter (INDEPENDENT_AMBULATORY_CARE_PROVIDER_SITE_OTHER): Payer: Medicare Other | Admitting: Ophthalmology

## 2021-03-08 DIAGNOSIS — J449 Chronic obstructive pulmonary disease, unspecified: Secondary | ICD-10-CM | POA: Diagnosis not present

## 2021-03-09 ENCOUNTER — Other Ambulatory Visit: Payer: Self-pay

## 2021-03-09 ENCOUNTER — Encounter (INDEPENDENT_AMBULATORY_CARE_PROVIDER_SITE_OTHER): Payer: Medicare Other | Admitting: Ophthalmology

## 2021-03-15 DIAGNOSIS — D509 Iron deficiency anemia, unspecified: Secondary | ICD-10-CM | POA: Diagnosis not present

## 2021-03-15 DIAGNOSIS — E782 Mixed hyperlipidemia: Secondary | ICD-10-CM | POA: Diagnosis not present

## 2021-03-24 DIAGNOSIS — K582 Mixed irritable bowel syndrome: Secondary | ICD-10-CM | POA: Diagnosis not present

## 2021-03-24 DIAGNOSIS — D509 Iron deficiency anemia, unspecified: Secondary | ICD-10-CM | POA: Diagnosis not present

## 2021-03-24 DIAGNOSIS — K219 Gastro-esophageal reflux disease without esophagitis: Secondary | ICD-10-CM | POA: Diagnosis not present

## 2021-03-24 DIAGNOSIS — I2510 Atherosclerotic heart disease of native coronary artery without angina pectoris: Secondary | ICD-10-CM | POA: Diagnosis not present

## 2021-03-24 DIAGNOSIS — N390 Urinary tract infection, site not specified: Secondary | ICD-10-CM | POA: Diagnosis not present

## 2021-03-24 DIAGNOSIS — M6281 Muscle weakness (generalized): Secondary | ICD-10-CM | POA: Diagnosis not present

## 2021-03-24 DIAGNOSIS — F03B18 Unspecified dementia, moderate, with other behavioral disturbance: Secondary | ICD-10-CM | POA: Diagnosis not present

## 2021-03-24 DIAGNOSIS — E782 Mixed hyperlipidemia: Secondary | ICD-10-CM | POA: Diagnosis not present

## 2021-03-24 DIAGNOSIS — I10 Essential (primary) hypertension: Secondary | ICD-10-CM | POA: Diagnosis not present

## 2021-03-29 ENCOUNTER — Encounter (INDEPENDENT_AMBULATORY_CARE_PROVIDER_SITE_OTHER): Payer: Medicare Other | Admitting: Ophthalmology

## 2021-04-13 ENCOUNTER — Encounter (INDEPENDENT_AMBULATORY_CARE_PROVIDER_SITE_OTHER): Payer: Self-pay | Admitting: Ophthalmology

## 2021-04-13 ENCOUNTER — Ambulatory Visit (INDEPENDENT_AMBULATORY_CARE_PROVIDER_SITE_OTHER): Payer: Medicare Other | Admitting: Ophthalmology

## 2021-04-13 DIAGNOSIS — H353221 Exudative age-related macular degeneration, left eye, with active choroidal neovascularization: Secondary | ICD-10-CM | POA: Diagnosis not present

## 2021-04-13 DIAGNOSIS — H2512 Age-related nuclear cataract, left eye: Secondary | ICD-10-CM | POA: Diagnosis not present

## 2021-04-13 DIAGNOSIS — H353212 Exudative age-related macular degeneration, right eye, with inactive choroidal neovascularization: Secondary | ICD-10-CM | POA: Diagnosis not present

## 2021-04-13 DIAGNOSIS — H353114 Nonexudative age-related macular degeneration, right eye, advanced atrophic with subfoveal involvement: Secondary | ICD-10-CM

## 2021-04-13 DIAGNOSIS — H35371 Puckering of macula, right eye: Secondary | ICD-10-CM

## 2021-04-13 NOTE — Assessment & Plan Note (Signed)
Observe this condition this condition ?

## 2021-04-13 NOTE — Assessment & Plan Note (Signed)
Currently I do not recommend proceeding with cataract surgery at this time ?

## 2021-04-13 NOTE — Assessment & Plan Note (Signed)
New atrophy fovea right eye, accounts for acuity, no active CNVM will observe ?

## 2021-04-13 NOTE — Assessment & Plan Note (Signed)
Chronic active subfoveal disciform, with permanent vision loss.  Thus making the need for cataract surgery moot ? ?

## 2021-04-13 NOTE — Progress Notes (Signed)
? ? ?04/13/2021 ? ?  ? ?CHIEF COMPLAINT ?Patient presents for  ?Chief Complaint  ?Patient presents with  ? Macular Degeneration  ? ? ? ? ?HISTORY OF PRESENT ILLNESS: ?Beverly [name] is a 88 y.o. female who presents to the clinic today for:  ? ?HPI   ?7 mos fu ou oct fp. ?Pt stated vision has gotten worse especially in the left eye. Pt stated vision seems a little fuzzy when watching TV or seeing things from a distance.  ?Pt denies FOL but sees floaters.  ? ? ? ?Last edited by Silvestre Moment on 04/13/2021  2:28 PM.  ?  ? ? ?Referring physician: ?Celene Squibb, MD ?[address] ?[place] ?[place],   [address] ? ?HISTORICAL INFORMATION:  ? ?Selected notes from the [facility] ?  ? ?Lab Results  ?Component Value Date  ? HGBA1C 6.1 (H) 10/19/2012  ?  ? ?CURRENT MEDICATIONS: ?Current Outpatient Medications (Ophthalmic Drugs)  ?Medication Sig  ? latanoprost (XALATAN) 0.005 % ophthalmic solution INSTILL ONE DROP IN BOTH EYES NIGHTLY  ? Propylene Glycol (SYSTANE BALANCE OP) Place 1-2 drops into both eyes daily as needed (dry eyes).  ? ?No current facility-administered medications for this visit. (Ophthalmic Drugs)  ? ?Current Outpatient Medications (Other)  ?Medication Sig  ? acetaminophen (TYLENOL) 500 MG tablet Take 1 tablet (500 mg total) by mouth as needed (no more than 3 gm / day).  ? ALPRAZolam (XANAX) 0.5 MG tablet Take 0.5 mg by mouth 3 (three) times daily as needed for anxiety.  ? amLODipine (NORVASC) 5 MG tablet Take 1 tablet (5 mg total) by mouth daily.  ? aspirin 81 MG chewable tablet Chew 1 tablet (81 mg total) by mouth daily.  ? atorvastatin (LIPITOR) 80 MG tablet Take 1 tablet (80 mg total) by mouth daily at 6 PM.  ? carvedilol (COREG) 6.25 MG tablet TAKE ONE TABLET (6.25MG  TOTAL) BY MOUTH TWO TIMES DAILY  ? cefdinir (OMNICEF) 300 MG capsule Take 1 capsule (300 mg total) by mouth every 12 (twelve) hours.  ? celecoxib (CELEBREX) 200 MG capsule Take 200 mg by mouth daily.  ? cholecalciferol (VITAMIN D) 1000 UNITS  tablet Take 1,000 Units by mouth daily.   ? clotrimazole (MYCELEX) 10 MG troche Take 1 tablet (10 mg total) by mouth 5 (five) times daily.  ? dicyclomine (BENTYL) 20 MG tablet Take 20 mg by mouth in the morning and at bedtime.  ? doxycycline (VIBRA-TABS) 100 MG tablet Take 1 tablet (100 mg total) by mouth every 12 (twelve) hours.  ? fluticasone (FLONASE) 50 MCG/ACT nasal spray Place 1 spray into both nostrils daily.   ? HYDROcodone-acetaminophen (NORCO) 10-325 MG tablet Take 1 tablet by mouth every 6 (six) hours as needed.  ? levETIRAcetam (KEPPRA) 500 MG tablet Take 500 mg by mouth 2 (two) times daily.  ? memantine (NAMENDA) 5 MG tablet Take 5 mg by mouth 2 (two) times daily.  ? methocarbamol (ROBAXIN) 500 MG tablet Take 1 tablet by mouth in the morning and at bedtime.   ? nitroGLYCERIN (NITROSTAT) 0.4 MG SL tablet Place 1 tablet (0.4 mg total) under the tongue every 5 (five) minutes x 3 doses as needed for chest pain.  ? pantoprazole (PROTONIX) 40 MG tablet Take 40 mg by mouth daily.  ? potassium chloride (K-DUR) 10 MEQ tablet Take 10 mEq by mouth daily.   ? predniSONE (DELTASONE) 50 MG tablet Take 1 tablet (50 mg total) by mouth daily with breakfast. (Patient taking differently: Take  50 mg by mouth as needed.)  ? risperiDONE (RISPERDAL) 0.5 MG tablet Take 0.5 mg by mouth 2 (two) times daily.  ? ?No current facility-administered medications for this visit. (Other)  ? ? ? ? ?REVIEW OF SYSTEMS: ?ROS   ?Negative for: Constitutional, Gastrointestinal, Neurological, Skin, Genitourinary, Musculoskeletal, HENT, Endocrine, Cardiovascular, Eyes, Respiratory, Psychiatric, Allergic/Imm, Heme/Lymph ?Last edited by Silvestre Moment on 04/13/2021  2:28 PM.  ?  ? ? ? ?ALLERGIES ?Allergies  ?Allergen Reactions  ? Lisinopril Swelling  ? Eggs Or Egg-Derived Products Itching  ?  Patient reports that she can eat eggs but does not tolerate egg derived products  ? Morphine And Related   ?  Headache  ? Penicillins Itching  ?  DID THE REACTION  INVOLVE: Swelling of the face/tongue/throat, SOB, or low BP? No ?Sudden or severe rash/hives, skin peeling, or the inside of the mouth or nose? No ?Did it require medical treatment? No ?When did it last happen? Unknown ?If all above answers are ?NO?, may proceed with cephalosporin use. ?  ? ? ?PAST MEDICAL HISTORY ?Past Medical History:  ?Diagnosis Date  ? Anxiety   ? Arthritis   ? COPD (chronic obstructive pulmonary disease) ([place])   ? Coronary artery disease   ? a. STEMI with cardiac arrest (polymorphic VT) s/p DES to RCA.  ? GERD (gastroesophageal reflux disease)   ? Headache(784.0)   ? Hearing loss, central   ? Left ear  ? Hematoma   ? Hypertension   ? Memory changes   ? Mild aortic stenosis   ? Mild carotid artery disease ([place])   ? a. 1-39% by duplex 02/2018.  ? Seizures ([place]) 10/2012  ? No seizures, speech issues; after a fall, hitting head on left side  ? UTI (urinary tract infection)   ? June 2022  ? ?Past Surgical History:  ?Procedure Laterality Date  ? ABDOMINAL HYSTERECTOMY    ? APPENDECTOMY    ? CARDIAC CATHETERIZATION    ? 15 yrs ago  ? CORONARY/GRAFT ACUTE MI REVASCULARIZATION N/A 01/10/2018  ? Procedure: Coronary/Graft Acute MI Revascularization;  Surgeon: Lorretta Harp, MD;  Location: [facility];  Service: Cardiovascular;  Laterality: N/A;  ? CRANIOTOMY Left 10/30/2012  ? Procedure: CRANIOTOMY HEMATOMA EVACUATION SUBDURAL;  Surgeon: Erline Levine, MD;  Location: [facility];  Service: Neurosurgery;  Laterality: Left;  Left Craniotomy for evacuation of subdural hematoma  ? JOINT REPLACEMENT Bilateral   ? knees  ? LEFT HEART CATH AND CORONARY ANGIOGRAPHY N/A 01/10/2018  ? Procedure: LEFT HEART CATH AND CORONARY ANGIOGRAPHY;  Surgeon: Lorretta Harp, MD;  Location: [facility];  Service: Cardiovascular;  Laterality: N/A;  ? ? ?FAMILY HISTORY ?Family History  ?Problem Relation Age of Onset  ? Dementia Mother   ? Cancer Father   ? Dementia Maternal Aunt   ? Ataxia Neg Hx   ? Chorea Neg Hx    ? Mental retardation Neg Hx   ? Migraines Neg Hx   ? Multiple sclerosis Neg Hx   ? Neurofibromatosis Neg Hx   ? Neuropathy Neg Hx   ? Parkinsonism Neg Hx   ? Seizures Neg Hx   ? Stroke Neg Hx   ? ? ?SOCIAL HISTORY ?Social History  ? ?Tobacco Use  ? Smoking status: Never  ? Smokeless tobacco: Never  ?Vaping Use  ? Vaping Use: Never used  ?Substance Use Topics  ? Alcohol use: No  ? Drug use: No  ? ?  ? ?  ? ?  OPHTHALMIC EXAM: ? ?Base Eye Exam   ? ? Visual Acuity (ETDRS)   ? ?   Right Left  ? Dist cc CF at 3' CF at 3'  ? ?  ?  ? ? Tonometry (Tonopen, 2:33 PM)   ? ?   Right Left  ? Pressure 15 14  ? ?  ?  ? ? Pupils   ? ?   Pupils APD  ? Right PERRL None  ? Left PERRL None  ? ?  ?  ? ? Visual Fields   ? ?   Left Right  ?  Full Full  ? ?  ?  ? ? Extraocular Movement   ? ?   Right Left  ?  Full Full  ? ?  ?  ? ? Neuro/Psych   ? ? Oriented x3: Yes  ? Mood/Affect: Normal  ? ?  ?  ? ? Dilation   ? ? Both eyes: 1.0% Mydriacyl, 2.5% Phenylephrine @ 2:32 PM  ? ?  ?  ? ?  ? ?Slit Lamp and Fundus Exam   ? ? External Exam   ? ?   Right Left  ? External Normal Normal  ? ?  ?  ? ? Slit Lamp Exam   ? ?   Right Left  ? Lids/Lashes Normal Normal  ? Conjunctiva/Sclera White and quiet White and quiet  ? Cornea Clear Clear  ? Anterior Chamber Deep and quiet Deep and quiet  ? Iris Round and reactive Round and reactive  ? Lens Centered posterior chamber intraocular lens 3+ Nuclear sclerosis  ? Anterior Vitreous Normal Normal  ? ?  ?  ? ? Fundus Exam   ? ?   Right Left  ? Posterior Vitreous Posterior vitreous detachment Posterior vitreous detachment  ? Disc Normal Normal  ? C/D Ratio 0.2 0.3  ? Macula Geographic atrophy, Advanced age related macular degeneration, no exudates, no macular thickening, Retinal pigment epithelial mottling Soft drusen, Pigmented atrophy, with subfoveal white fibrotic disciform scar, no fluid, no hemorrhage, in wheelchair, 20 diopter lens only  ? Vessels Normal Normal  ? Periphery Normal Normal  ? ?  ?  ? ?   ? ? ?IMAGING AND PROCEDURES  ?Imaging and Procedures for 04/13/21 ? ?OCT, Retina - OU - Both Eyes   ? ?   ?Right Eye ?Quality was good. Scan locations included subfoveal. Central Foveal Thickness: 307. Progressio

## 2021-04-19 DIAGNOSIS — J449 Chronic obstructive pulmonary disease, unspecified: Secondary | ICD-10-CM | POA: Diagnosis not present

## 2021-07-12 DIAGNOSIS — F03A2 Unspecified dementia, mild, with psychotic disturbance: Secondary | ICD-10-CM | POA: Diagnosis not present

## 2021-07-12 DIAGNOSIS — J449 Chronic obstructive pulmonary disease, unspecified: Secondary | ICD-10-CM | POA: Diagnosis not present

## 2021-07-19 DIAGNOSIS — D508 Other iron deficiency anemias: Secondary | ICD-10-CM | POA: Diagnosis not present

## 2021-07-19 DIAGNOSIS — I10 Essential (primary) hypertension: Secondary | ICD-10-CM | POA: Diagnosis not present

## 2021-07-21 DIAGNOSIS — F03B18 Unspecified dementia, moderate, with other behavioral disturbance: Secondary | ICD-10-CM | POA: Diagnosis not present

## 2021-07-21 DIAGNOSIS — I2510 Atherosclerotic heart disease of native coronary artery without angina pectoris: Secondary | ICD-10-CM | POA: Diagnosis not present

## 2021-07-21 DIAGNOSIS — K582 Mixed irritable bowel syndrome: Secondary | ICD-10-CM | POA: Diagnosis not present

## 2021-07-21 DIAGNOSIS — D509 Iron deficiency anemia, unspecified: Secondary | ICD-10-CM | POA: Diagnosis not present

## 2021-07-21 DIAGNOSIS — K219 Gastro-esophageal reflux disease without esophagitis: Secondary | ICD-10-CM | POA: Diagnosis not present

## 2021-07-21 DIAGNOSIS — Z0001 Encounter for general adult medical examination with abnormal findings: Secondary | ICD-10-CM | POA: Diagnosis not present

## 2021-07-21 DIAGNOSIS — M6281 Muscle weakness (generalized): Secondary | ICD-10-CM | POA: Diagnosis not present

## 2021-07-21 DIAGNOSIS — I10 Essential (primary) hypertension: Secondary | ICD-10-CM | POA: Diagnosis not present

## 2021-07-21 DIAGNOSIS — E782 Mixed hyperlipidemia: Secondary | ICD-10-CM | POA: Diagnosis not present

## 2021-08-03 DIAGNOSIS — 419620001 Death: Secondary | SNOMED CT | POA: Diagnosis not present

## 2021-08-03 DEATH — deceased

## 2021-09-02 DIAGNOSIS — I2510 Atherosclerotic heart disease of native coronary artery without angina pectoris: Secondary | ICD-10-CM | POA: Diagnosis not present

## 2021-09-02 DIAGNOSIS — F03B18 Unspecified dementia, moderate, with other behavioral disturbance: Secondary | ICD-10-CM | POA: Diagnosis not present

## 2021-09-02 DIAGNOSIS — I10 Essential (primary) hypertension: Secondary | ICD-10-CM | POA: Diagnosis not present

## 2021-09-02 DIAGNOSIS — E782 Mixed hyperlipidemia: Secondary | ICD-10-CM | POA: Diagnosis not present

## 2021-09-27 DIAGNOSIS — F03A2 Unspecified dementia, mild, with psychotic disturbance: Secondary | ICD-10-CM | POA: Diagnosis not present

## 2021-09-27 DIAGNOSIS — J449 Chronic obstructive pulmonary disease, unspecified: Secondary | ICD-10-CM | POA: Diagnosis not present

## 2021-09-29 DIAGNOSIS — R350 Frequency of micturition: Secondary | ICD-10-CM | POA: Diagnosis not present

## 2021-09-29 DIAGNOSIS — M6281 Muscle weakness (generalized): Secondary | ICD-10-CM | POA: Diagnosis not present

## 2021-09-29 DIAGNOSIS — F03B18 Unspecified dementia, moderate, with other behavioral disturbance: Secondary | ICD-10-CM | POA: Diagnosis not present

## 2021-10-01 DIAGNOSIS — I10 Essential (primary) hypertension: Secondary | ICD-10-CM | POA: Diagnosis not present

## 2021-10-01 DIAGNOSIS — K589 Irritable bowel syndrome without diarrhea: Secondary | ICD-10-CM | POA: Diagnosis not present

## 2021-10-01 DIAGNOSIS — D509 Iron deficiency anemia, unspecified: Secondary | ICD-10-CM | POA: Diagnosis not present

## 2021-10-01 DIAGNOSIS — Z7982 Long term (current) use of aspirin: Secondary | ICD-10-CM | POA: Diagnosis not present

## 2021-10-01 DIAGNOSIS — F02B3 Dementia in other diseases classified elsewhere, moderate, with mood disturbance: Secondary | ICD-10-CM | POA: Diagnosis not present

## 2021-10-01 DIAGNOSIS — G894 Chronic pain syndrome: Secondary | ICD-10-CM | POA: Diagnosis not present

## 2021-10-01 DIAGNOSIS — G4710 Hypersomnia, unspecified: Secondary | ICD-10-CM | POA: Diagnosis not present

## 2021-10-01 DIAGNOSIS — Z791 Long term (current) use of non-steroidal anti-inflammatories (NSAID): Secondary | ICD-10-CM | POA: Diagnosis not present

## 2021-10-01 DIAGNOSIS — Z9181 History of falling: Secondary | ICD-10-CM | POA: Diagnosis not present

## 2021-10-01 DIAGNOSIS — I2510 Atherosclerotic heart disease of native coronary artery without angina pectoris: Secondary | ICD-10-CM | POA: Diagnosis not present

## 2021-10-01 DIAGNOSIS — Z87440 Personal history of urinary (tract) infections: Secondary | ICD-10-CM | POA: Diagnosis not present

## 2021-10-01 DIAGNOSIS — E782 Mixed hyperlipidemia: Secondary | ICD-10-CM | POA: Diagnosis not present

## 2021-10-01 DIAGNOSIS — Z79891 Long term (current) use of opiate analgesic: Secondary | ICD-10-CM | POA: Diagnosis not present

## 2021-10-01 DIAGNOSIS — M6281 Muscle weakness (generalized): Secondary | ICD-10-CM | POA: Diagnosis not present

## 2021-10-01 DIAGNOSIS — R350 Frequency of micturition: Secondary | ICD-10-CM | POA: Diagnosis not present

## 2021-10-01 DIAGNOSIS — F02B18 Dementia in other diseases classified elsewhere, moderate, with other behavioral disturbance: Secondary | ICD-10-CM | POA: Diagnosis not present

## 2021-10-02 DIAGNOSIS — Z9181 History of falling: Secondary | ICD-10-CM | POA: Diagnosis not present

## 2021-10-02 DIAGNOSIS — G894 Chronic pain syndrome: Secondary | ICD-10-CM | POA: Diagnosis not present

## 2021-10-02 DIAGNOSIS — F02B3 Dementia in other diseases classified elsewhere, moderate, with mood disturbance: Secondary | ICD-10-CM | POA: Diagnosis not present

## 2021-10-02 DIAGNOSIS — R350 Frequency of micturition: Secondary | ICD-10-CM | POA: Diagnosis not present

## 2021-10-02 DIAGNOSIS — D509 Iron deficiency anemia, unspecified: Secondary | ICD-10-CM | POA: Diagnosis not present

## 2021-10-02 DIAGNOSIS — I10 Essential (primary) hypertension: Secondary | ICD-10-CM | POA: Diagnosis not present

## 2021-10-02 DIAGNOSIS — Z791 Long term (current) use of non-steroidal anti-inflammatories (NSAID): Secondary | ICD-10-CM | POA: Diagnosis not present

## 2021-10-02 DIAGNOSIS — M6281 Muscle weakness (generalized): Secondary | ICD-10-CM | POA: Diagnosis not present

## 2021-10-02 DIAGNOSIS — Z7982 Long term (current) use of aspirin: Secondary | ICD-10-CM | POA: Diagnosis not present

## 2021-10-02 DIAGNOSIS — F02B18 Dementia in other diseases classified elsewhere, moderate, with other behavioral disturbance: Secondary | ICD-10-CM | POA: Diagnosis not present

## 2021-10-02 DIAGNOSIS — G4710 Hypersomnia, unspecified: Secondary | ICD-10-CM | POA: Diagnosis not present

## 2021-10-02 DIAGNOSIS — I2510 Atherosclerotic heart disease of native coronary artery without angina pectoris: Secondary | ICD-10-CM | POA: Diagnosis not present

## 2021-10-02 DIAGNOSIS — K589 Irritable bowel syndrome without diarrhea: Secondary | ICD-10-CM | POA: Diagnosis not present

## 2021-10-02 DIAGNOSIS — E782 Mixed hyperlipidemia: Secondary | ICD-10-CM | POA: Diagnosis not present

## 2021-10-02 DIAGNOSIS — Z79891 Long term (current) use of opiate analgesic: Secondary | ICD-10-CM | POA: Diagnosis not present

## 2021-10-02 DIAGNOSIS — Z87440 Personal history of urinary (tract) infections: Secondary | ICD-10-CM | POA: Diagnosis not present

## 2021-10-05 DIAGNOSIS — Z9181 History of falling: Secondary | ICD-10-CM | POA: Diagnosis not present

## 2021-10-05 DIAGNOSIS — D509 Iron deficiency anemia, unspecified: Secondary | ICD-10-CM | POA: Diagnosis not present

## 2021-10-05 DIAGNOSIS — F02B3 Dementia in other diseases classified elsewhere, moderate, with mood disturbance: Secondary | ICD-10-CM | POA: Diagnosis not present

## 2021-10-05 DIAGNOSIS — Z7982 Long term (current) use of aspirin: Secondary | ICD-10-CM | POA: Diagnosis not present

## 2021-10-05 DIAGNOSIS — Z791 Long term (current) use of non-steroidal anti-inflammatories (NSAID): Secondary | ICD-10-CM | POA: Diagnosis not present

## 2021-10-05 DIAGNOSIS — R350 Frequency of micturition: Secondary | ICD-10-CM | POA: Diagnosis not present

## 2021-10-05 DIAGNOSIS — E782 Mixed hyperlipidemia: Secondary | ICD-10-CM | POA: Diagnosis not present

## 2021-10-05 DIAGNOSIS — M6281 Muscle weakness (generalized): Secondary | ICD-10-CM | POA: Diagnosis not present

## 2021-10-05 DIAGNOSIS — I2510 Atherosclerotic heart disease of native coronary artery without angina pectoris: Secondary | ICD-10-CM | POA: Diagnosis not present

## 2021-10-05 DIAGNOSIS — G4710 Hypersomnia, unspecified: Secondary | ICD-10-CM | POA: Diagnosis not present

## 2021-10-05 DIAGNOSIS — Z79891 Long term (current) use of opiate analgesic: Secondary | ICD-10-CM | POA: Diagnosis not present

## 2021-10-05 DIAGNOSIS — G894 Chronic pain syndrome: Secondary | ICD-10-CM | POA: Diagnosis not present

## 2021-10-05 DIAGNOSIS — Z87440 Personal history of urinary (tract) infections: Secondary | ICD-10-CM | POA: Diagnosis not present

## 2021-10-05 DIAGNOSIS — K589 Irritable bowel syndrome without diarrhea: Secondary | ICD-10-CM | POA: Diagnosis not present

## 2021-10-05 DIAGNOSIS — F02B18 Dementia in other diseases classified elsewhere, moderate, with other behavioral disturbance: Secondary | ICD-10-CM | POA: Diagnosis not present

## 2021-10-05 DIAGNOSIS — I10 Essential (primary) hypertension: Secondary | ICD-10-CM | POA: Diagnosis not present

## 2021-10-06 ENCOUNTER — Encounter (INDEPENDENT_AMBULATORY_CARE_PROVIDER_SITE_OTHER): Payer: Self-pay | Admitting: Ophthalmology

## 2021-10-06 ENCOUNTER — Ambulatory Visit (INDEPENDENT_AMBULATORY_CARE_PROVIDER_SITE_OTHER): Payer: Medicare Other | Admitting: Ophthalmology

## 2021-10-06 DIAGNOSIS — H353212 Exudative age-related macular degeneration, right eye, with inactive choroidal neovascularization: Secondary | ICD-10-CM | POA: Diagnosis not present

## 2021-10-06 DIAGNOSIS — H353114 Nonexudative age-related macular degeneration, right eye, advanced atrophic with subfoveal involvement: Secondary | ICD-10-CM | POA: Diagnosis not present

## 2021-10-06 DIAGNOSIS — H353221 Exudative age-related macular degeneration, left eye, with active choroidal neovascularization: Secondary | ICD-10-CM | POA: Diagnosis not present

## 2021-10-06 DIAGNOSIS — H2512 Age-related nuclear cataract, left eye: Secondary | ICD-10-CM | POA: Diagnosis not present

## 2021-10-06 NOTE — Progress Notes (Signed)
10/06/2021     CHIEF COMPLAINT Patient presents for  Chief Complaint  Patient presents with   Macular Degeneration      HISTORY OF PRESENT ILLNESS: Beverly [name] is a 88 y.o. female who presents to the clinic today for:   HPI   Age related macular degeneration of left eye 6 mths dilate ou color fp oct Pt states her vision has not been stable Pt denies any new floaters  Pt admits to FOL  Last edited by Aleene Davidson, CMA on 10/06/2021  1:42 PM.      Referring physician: Benita Stabile, MD [address] [place],  [place] [address]  HISTORICAL INFORMATION:   Selected notes from the MEDICAL RECORD NUMBER    Lab Results  Component Value Date   HGBA1C 6.1 (H) 10/19/2012     CURRENT MEDICATIONS: Current Outpatient Medications (Ophthalmic Drugs)  Medication Sig   latanoprost (XALATAN) 0.005 % ophthalmic solution INSTILL ONE DROP IN BOTH EYES NIGHTLY   Propylene Glycol (SYSTANE BALANCE OP) Place 1-2 drops into both eyes daily as needed (dry eyes).   No current facility-administered medications for this visit. (Ophthalmic Drugs)   Current Outpatient Medications (Other)  Medication Sig   acetaminophen (TYLENOL) 500 MG tablet Take 1 tablet (500 mg total) by mouth as needed (no more than 3 gm / day).   ALPRAZolam (XANAX) 0.5 MG tablet Take 0.5 mg by mouth 3 (three) times daily as needed for anxiety.   amLODipine (NORVASC) 5 MG tablet Take 1 tablet (5 mg total) by mouth daily.   aspirin 81 MG chewable tablet Chew 1 tablet (81 mg total) by mouth daily.   atorvastatin (LIPITOR) 80 MG tablet Take 1 tablet (80 mg total) by mouth daily at 6 PM.   carvedilol (COREG) 6.25 MG tablet TAKE ONE TABLET (6.25MG  TOTAL) BY MOUTH TWO TIMES DAILY   cefdinir (OMNICEF) 300 MG capsule Take 1 capsule (300 mg total) by mouth every 12 (twelve) hours.   celecoxib (CELEBREX) 200 MG capsule Take 200 mg by mouth daily.   cholecalciferol (VITAMIN D) 1000 UNITS tablet Take 1,000 Units by mouth  daily.    clotrimazole (MYCELEX) 10 MG troche Take 1 tablet (10 mg total) by mouth 5 (five) times daily.   dicyclomine (BENTYL) 20 MG tablet Take 20 mg by mouth in the morning and at bedtime.   doxycycline (VIBRA-TABS) 100 MG tablet Take 1 tablet (100 mg total) by mouth every 12 (twelve) hours.   fluticasone (FLONASE) 50 MCG/ACT nasal spray Place 1 spray into both nostrils daily.    HYDROcodone-acetaminophen (NORCO) 10-325 MG tablet Take 1 tablet by mouth every 6 (six) hours as needed.   levETIRAcetam (KEPPRA) 500 MG tablet Take 500 mg by mouth 2 (two) times daily.   memantine (NAMENDA) 5 MG tablet Take 5 mg by mouth 2 (two) times daily.   methocarbamol (ROBAXIN) 500 MG tablet Take 1 tablet by mouth in the morning and at bedtime.    nitroGLYCERIN (NITROSTAT) 0.4 MG SL tablet Place 1 tablet (0.4 mg total) under the tongue every 5 (five) minutes x 3 doses as needed for chest pain.   pantoprazole (PROTONIX) 40 MG tablet Take 40 mg by mouth daily.   potassium chloride (K-DUR) 10 MEQ tablet Take 10 mEq by mouth daily.    predniSONE (DELTASONE) 50 MG tablet Take 1 tablet (50 mg total) by mouth daily with breakfast. (Patient taking differently: Take 50 mg by mouth as needed.)   risperiDONE (  RISPERDAL) 0.5 MG tablet Take 0.5 mg by mouth 2 (two) times daily.   No current facility-administered medications for this visit. (Other)      REVIEW OF SYSTEMS: ROS   Negative for: Constitutional, Gastrointestinal, Neurological, Skin, Genitourinary, Musculoskeletal, HENT, Endocrine, Cardiovascular, Eyes, Respiratory, Psychiatric, Allergic/Imm, Heme/Lymph Last edited by Aleene Davidson, CMA on 10/06/2021  1:42 PM.       ALLERGIES Allergies  Allergen Reactions   Lisinopril Swelling   Eggs Or Egg-Derived Products Itching    Patient reports that she can eat eggs but does not tolerate egg derived products   Morphine And Related     Headache   Penicillins Itching    DID THE REACTION INVOLVE: Swelling of  the face/tongue/throat, SOB, or low BP? No Sudden or severe rash/hives, skin peeling, or the inside of the mouth or nose? No Did it require medical treatment? No When did it last happen? Unknown If all above answers are "NO", may proceed with cephalosporin use.     PAST MEDICAL HISTORY Past Medical History:  Diagnosis Date   Anxiety    Arthritis    COPD (chronic obstructive pulmonary disease) ([facility])    Coronary artery disease    a. STEMI with cardiac arrest (polymorphic VT) s/p DES to RCA.   GERD (gastroesophageal reflux disease)    Headache(784.0)    Hearing loss, central    Left ear   Hematoma    Hypertension    Memory changes    Mild aortic stenosis    Mild carotid artery disease ([facility])    a. 1-39% by duplex 02/2018.   Seizures ([facility]) 10/2012   No seizures, speech issues; after a fall, hitting head on left side   UTI (urinary tract infection)    June 2022   Past Surgical History:  Procedure Laterality Date   ABDOMINAL HYSTERECTOMY     APPENDECTOMY     CARDIAC CATHETERIZATION     15 yrs ago   CORONARY/GRAFT ACUTE MI REVASCULARIZATION N/A 01/10/2018   Procedure: Coronary/Graft Acute MI Revascularization;  Surgeon: Runell Gess, MD;  Location: [facility];  Service: Cardiovascular;  Laterality: N/A;   CRANIOTOMY Left 10/30/2012   Procedure: CRANIOTOMY HEMATOMA EVACUATION SUBDURAL;  Surgeon: Maeola Harman, MD;  Location: [facility];  Service: Neurosurgery;  Laterality: Left;  Left Craniotomy for evacuation of subdural hematoma   JOINT REPLACEMENT Bilateral    knees   LEFT HEART CATH AND CORONARY ANGIOGRAPHY N/A 01/10/2018   Procedure: LEFT HEART CATH AND CORONARY ANGIOGRAPHY;  Surgeon: Runell Gess, MD;  Location: [facility];  Service: Cardiovascular;  Laterality: N/A;    FAMILY HISTORY Family History  Problem Relation Age of Onset   Dementia Mother    Cancer Father    Dementia Maternal Aunt    Ataxia Neg Hx    Chorea Neg Hx    Mental  retardation Neg Hx    Migraines Neg Hx    Multiple sclerosis Neg Hx    Neurofibromatosis Neg Hx    Neuropathy Neg Hx    Parkinsonism Neg Hx    Seizures Neg Hx    Stroke Neg Hx     SOCIAL HISTORY Social History   Tobacco Use   Smoking status: Never   Smokeless tobacco: Never  Vaping Use   Vaping Use: Never used  Substance Use Topics   Alcohol use: No   Drug use: No         OPHTHALMIC EXAM:  Base Eye Exam  Visual Acuity (Snellen - Linear)       Right Left   Dist [place] CF at 6' CF at face'         Tonometry (Tonopen, 1:46 PM)       Right Left   Pressure 7 8         Pupils       Pupils   Right PERRL   Left PERRL         Neuro/Psych     Oriented x3: Yes   Mood/Affect: Normal         Dilation     Both eyes: 1.0% Mydriacyl, 2.5% Phenylephrine @ 1:44 PM           Slit Lamp and Fundus Exam     External Exam       Right Left   External Normal, patient weak markedly kyphotic Normal, patient weak markedly kyphotic         Slit Lamp Exam       Right Left   Lids/Lashes Normal Normal   Conjunctiva/Sclera White and quiet White and quiet   Cornea Clear Clear   Anterior Chamber Deep and quiet Deep and quiet   Iris Round and reactive Round and reactive   Lens Centered posterior chamber intraocular lens 3+ Nuclear sclerosis   Anterior Vitreous Normal Normal         Fundus Exam       Right Left   Posterior Vitreous Posterior vitreous detachment Posterior vitreous detachment   Disc Normal Normal   C/D Ratio 0.2 0.3   Macula Geographic atrophy, Advanced age related macular degeneration, no exudates, no macular thickening, Retinal pigment epithelial mottling Soft drusen, Pigmented atrophy, with subfoveal white fibrotic disciform scar, no fluid, in wheelchair, 20 diopter lens only, Subretinal hemorrhage   Vessels Normal Normal   Periphery Normal Normal            IMAGING AND PROCEDURES  Imaging and Procedures for 10/06/21            ASSESSMENT/PLAN:  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 neovascularization ([facility]) Central geographic atrophy no change  Advanced nonexudative age-related macular degeneration of right eye with subfoveal involvement No sign of CNVM  Exudative age-related macular degeneration of left eye with active choroidal neovascularization ([facility]) Large disciform encompasses the clinicians macula of the retina  Age-related nuclear cataract of left eye OS patient too fatigued and too weak to accomplish this cataract surgery will continue to monitor and observe     ICD-10-CM   1. Exudative age-related macular degeneration of left eye with active choroidal neovascularization ([place])  H35.3221 CANCELED: OCT, Retina - OU - Both Eyes    2. 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 neovascularization ([place])  H35.3212     3. Advanced nonexudative age-related macular degeneration of right eye with subfoveal involvement  H35.3114     4. Age-related nuclear cataract of left eye  H25.12       1.  OD not requiring of therapy.  2.  OS, large disciform scar hampers need for any intervention.  3.  Ophthalmic Meds Ordered this visit:  No orders of the defined types were placed in this encounter.      Return in about 6 months (around 04/07/2022) for DILATE OU, COLOR FP.  There are no Patient Instructions on file for this visit.   Explained the diagnoses, plan, and follow up with the patient and they expressed understanding.  Patient expressed understanding of the importance of proper follow up care.   Alford Highland Marithza Malachi M.D. Diseases & Surgery of the Retina and Vitreous [facility] 10/06/21     Abbreviations: M myopia (nearsighted); A astigmatism; H hyperopia (farsighted); P presbyopia; Mrx spectacle prescription;  CTL contact lenses; OD right eye; OS left eye; OU both eyes  XT exotropia; ET esotropia;  PEK punctate epithelial keratitis; PEE punctate epithelial erosions; DES dry eye syndrome; MGD meibomian gland dysfunction; ATs artificial tears; PFAT's preservative free artificial tears; NSC nuclear sclerotic cataract; PSC posterior subcapsular cataract; ERM epi-retinal membrane; PVD posterior vitreous detachment; RD retinal detachment; DM diabetes mellitus; DR diabetic retinopathy; NPDR non-proliferative diabetic retinopathy; PDR proliferative diabetic retinopathy; CSME clinically significant macular edema; DME diabetic macular edema; dbh dot blot hemorrhages; CWS cotton wool spot; POAG primary open angle glaucoma; C/D cup-to-disc ratio; HVF humphrey visual field; GVF goldmann visual field; OCT optical coherence tomography; IOP intraocular pressure; BRVO Branch retinal vein occlusion; CRVO central retinal vein occlusion; CRAO central retinal artery occlusion; BRAO branch retinal artery occlusion; RT retinal tear; SB scleral buckle; PPV pars plana vitrectomy; VH Vitreous hemorrhage; PRP panretinal laser photocoagulation; IVK intravitreal kenalog; VMT vitreomacular traction; MH Macular hole;  NVD neovascularization of the disc; NVE neovascularization elsewhere; AREDS age related eye disease study; ARMD age related macular degeneration; POAG primary open angle glaucoma; EBMD epithelial/anterior basement membrane dystrophy; ACIOL anterior chamber intraocular lens; IOL intraocular lens; PCIOL posterior chamber intraocular lens; Phaco/IOL phacoemulsification with intraocular lens placement; PRK photorefractive keratectomy; LASIK laser assisted in situ keratomileusis; HTN hypertension; DM diabetes mellitus; COPD chronic obstructive pulmonary disease

## 2021-10-06 NOTE — Assessment & Plan Note (Signed)
Central geographic atrophy no change

## 2021-10-06 NOTE — Assessment & Plan Note (Signed)
No sign of CNVM 

## 2021-10-06 NOTE — Assessment & Plan Note (Signed)
OS patient too fatigued and too weak to accomplish this cataract surgery will continue to monitor and observe

## 2021-10-06 NOTE — Assessment & Plan Note (Signed)
Large disciform encompasses the clinicians macula of the retina

## 2021-10-08 DIAGNOSIS — E782 Mixed hyperlipidemia: Secondary | ICD-10-CM | POA: Diagnosis not present

## 2021-10-08 DIAGNOSIS — I10 Essential (primary) hypertension: Secondary | ICD-10-CM | POA: Diagnosis not present

## 2021-10-08 DIAGNOSIS — Z87440 Personal history of urinary (tract) infections: Secondary | ICD-10-CM | POA: Diagnosis not present

## 2021-10-08 DIAGNOSIS — F02B3 Dementia in other diseases classified elsewhere, moderate, with mood disturbance: Secondary | ICD-10-CM | POA: Diagnosis not present

## 2021-10-08 DIAGNOSIS — G894 Chronic pain syndrome: Secondary | ICD-10-CM | POA: Diagnosis not present

## 2021-10-08 DIAGNOSIS — I2510 Atherosclerotic heart disease of native coronary artery without angina pectoris: Secondary | ICD-10-CM | POA: Diagnosis not present

## 2021-10-08 DIAGNOSIS — Z7982 Long term (current) use of aspirin: Secondary | ICD-10-CM | POA: Diagnosis not present

## 2021-10-08 DIAGNOSIS — G4710 Hypersomnia, unspecified: Secondary | ICD-10-CM | POA: Diagnosis not present

## 2021-10-08 DIAGNOSIS — Z791 Long term (current) use of non-steroidal anti-inflammatories (NSAID): Secondary | ICD-10-CM | POA: Diagnosis not present

## 2021-10-08 DIAGNOSIS — M6281 Muscle weakness (generalized): Secondary | ICD-10-CM | POA: Diagnosis not present

## 2021-10-08 DIAGNOSIS — Z9181 History of falling: Secondary | ICD-10-CM | POA: Diagnosis not present

## 2021-10-08 DIAGNOSIS — K589 Irritable bowel syndrome without diarrhea: Secondary | ICD-10-CM | POA: Diagnosis not present

## 2021-10-08 DIAGNOSIS — R350 Frequency of micturition: Secondary | ICD-10-CM | POA: Diagnosis not present

## 2021-10-08 DIAGNOSIS — D509 Iron deficiency anemia, unspecified: Secondary | ICD-10-CM | POA: Diagnosis not present

## 2021-10-08 DIAGNOSIS — F02B18 Dementia in other diseases classified elsewhere, moderate, with other behavioral disturbance: Secondary | ICD-10-CM | POA: Diagnosis not present

## 2021-10-08 DIAGNOSIS — Z79891 Long term (current) use of opiate analgesic: Secondary | ICD-10-CM | POA: Diagnosis not present

## 2021-10-12 DIAGNOSIS — I2510 Atherosclerotic heart disease of native coronary artery without angina pectoris: Secondary | ICD-10-CM | POA: Diagnosis not present

## 2021-10-12 DIAGNOSIS — Z9181 History of falling: Secondary | ICD-10-CM | POA: Diagnosis not present

## 2021-10-12 DIAGNOSIS — Z87440 Personal history of urinary (tract) infections: Secondary | ICD-10-CM | POA: Diagnosis not present

## 2021-10-12 DIAGNOSIS — F02B18 Dementia in other diseases classified elsewhere, moderate, with other behavioral disturbance: Secondary | ICD-10-CM | POA: Diagnosis not present

## 2021-10-12 DIAGNOSIS — Z7982 Long term (current) use of aspirin: Secondary | ICD-10-CM | POA: Diagnosis not present

## 2021-10-12 DIAGNOSIS — G4710 Hypersomnia, unspecified: Secondary | ICD-10-CM | POA: Diagnosis not present

## 2021-10-12 DIAGNOSIS — K589 Irritable bowel syndrome without diarrhea: Secondary | ICD-10-CM | POA: Diagnosis not present

## 2021-10-12 DIAGNOSIS — M6281 Muscle weakness (generalized): Secondary | ICD-10-CM | POA: Diagnosis not present

## 2021-10-12 DIAGNOSIS — R350 Frequency of micturition: Secondary | ICD-10-CM | POA: Diagnosis not present

## 2021-10-12 DIAGNOSIS — E782 Mixed hyperlipidemia: Secondary | ICD-10-CM | POA: Diagnosis not present

## 2021-10-12 DIAGNOSIS — D509 Iron deficiency anemia, unspecified: Secondary | ICD-10-CM | POA: Diagnosis not present

## 2021-10-12 DIAGNOSIS — Z79891 Long term (current) use of opiate analgesic: Secondary | ICD-10-CM | POA: Diagnosis not present

## 2021-10-12 DIAGNOSIS — I10 Essential (primary) hypertension: Secondary | ICD-10-CM | POA: Diagnosis not present

## 2021-10-12 DIAGNOSIS — G894 Chronic pain syndrome: Secondary | ICD-10-CM | POA: Diagnosis not present

## 2021-10-12 DIAGNOSIS — F02B3 Dementia in other diseases classified elsewhere, moderate, with mood disturbance: Secondary | ICD-10-CM | POA: Diagnosis not present

## 2021-10-12 DIAGNOSIS — Z791 Long term (current) use of non-steroidal anti-inflammatories (NSAID): Secondary | ICD-10-CM | POA: Diagnosis not present

## 2021-10-13 DIAGNOSIS — F02B3 Dementia in other diseases classified elsewhere, moderate, with mood disturbance: Secondary | ICD-10-CM | POA: Diagnosis not present

## 2021-10-13 DIAGNOSIS — K589 Irritable bowel syndrome without diarrhea: Secondary | ICD-10-CM | POA: Diagnosis not present

## 2021-10-13 DIAGNOSIS — R350 Frequency of micturition: Secondary | ICD-10-CM | POA: Diagnosis not present

## 2021-10-13 DIAGNOSIS — I2510 Atherosclerotic heart disease of native coronary artery without angina pectoris: Secondary | ICD-10-CM | POA: Diagnosis not present

## 2021-10-13 DIAGNOSIS — Z79891 Long term (current) use of opiate analgesic: Secondary | ICD-10-CM | POA: Diagnosis not present

## 2021-10-13 DIAGNOSIS — G4710 Hypersomnia, unspecified: Secondary | ICD-10-CM | POA: Diagnosis not present

## 2021-10-13 DIAGNOSIS — Z9181 History of falling: Secondary | ICD-10-CM | POA: Diagnosis not present

## 2021-10-13 DIAGNOSIS — Z87440 Personal history of urinary (tract) infections: Secondary | ICD-10-CM | POA: Diagnosis not present

## 2021-10-13 DIAGNOSIS — F02B18 Dementia in other diseases classified elsewhere, moderate, with other behavioral disturbance: Secondary | ICD-10-CM | POA: Diagnosis not present

## 2021-10-13 DIAGNOSIS — I10 Essential (primary) hypertension: Secondary | ICD-10-CM | POA: Diagnosis not present

## 2021-10-13 DIAGNOSIS — D509 Iron deficiency anemia, unspecified: Secondary | ICD-10-CM | POA: Diagnosis not present

## 2021-10-13 DIAGNOSIS — Z7982 Long term (current) use of aspirin: Secondary | ICD-10-CM | POA: Diagnosis not present

## 2021-10-13 DIAGNOSIS — M6281 Muscle weakness (generalized): Secondary | ICD-10-CM | POA: Diagnosis not present

## 2021-10-13 DIAGNOSIS — G894 Chronic pain syndrome: Secondary | ICD-10-CM | POA: Diagnosis not present

## 2021-10-13 DIAGNOSIS — E782 Mixed hyperlipidemia: Secondary | ICD-10-CM | POA: Diagnosis not present

## 2021-10-13 DIAGNOSIS — Z791 Long term (current) use of non-steroidal anti-inflammatories (NSAID): Secondary | ICD-10-CM | POA: Diagnosis not present

## 2021-10-14 ENCOUNTER — Encounter (INDEPENDENT_AMBULATORY_CARE_PROVIDER_SITE_OTHER): Payer: Medicare Other | Admitting: Ophthalmology

## 2021-10-15 DIAGNOSIS — F02B18 Dementia in other diseases classified elsewhere, moderate, with other behavioral disturbance: Secondary | ICD-10-CM | POA: Diagnosis not present

## 2021-10-15 DIAGNOSIS — I10 Essential (primary) hypertension: Secondary | ICD-10-CM | POA: Diagnosis not present

## 2021-10-15 DIAGNOSIS — Z79891 Long term (current) use of opiate analgesic: Secondary | ICD-10-CM | POA: Diagnosis not present

## 2021-10-15 DIAGNOSIS — G894 Chronic pain syndrome: Secondary | ICD-10-CM | POA: Diagnosis not present

## 2021-10-15 DIAGNOSIS — Z9181 History of falling: Secondary | ICD-10-CM | POA: Diagnosis not present

## 2021-10-15 DIAGNOSIS — Z791 Long term (current) use of non-steroidal anti-inflammatories (NSAID): Secondary | ICD-10-CM | POA: Diagnosis not present

## 2021-10-15 DIAGNOSIS — I2510 Atherosclerotic heart disease of native coronary artery without angina pectoris: Secondary | ICD-10-CM | POA: Diagnosis not present

## 2021-10-15 DIAGNOSIS — F02B3 Dementia in other diseases classified elsewhere, moderate, with mood disturbance: Secondary | ICD-10-CM | POA: Diagnosis not present

## 2021-10-15 DIAGNOSIS — Z87440 Personal history of urinary (tract) infections: Secondary | ICD-10-CM | POA: Diagnosis not present

## 2021-10-15 DIAGNOSIS — Z7982 Long term (current) use of aspirin: Secondary | ICD-10-CM | POA: Diagnosis not present

## 2021-10-15 DIAGNOSIS — K589 Irritable bowel syndrome without diarrhea: Secondary | ICD-10-CM | POA: Diagnosis not present

## 2021-10-15 DIAGNOSIS — E782 Mixed hyperlipidemia: Secondary | ICD-10-CM | POA: Diagnosis not present

## 2021-10-15 DIAGNOSIS — G4710 Hypersomnia, unspecified: Secondary | ICD-10-CM | POA: Diagnosis not present

## 2021-10-15 DIAGNOSIS — R350 Frequency of micturition: Secondary | ICD-10-CM | POA: Diagnosis not present

## 2021-10-15 DIAGNOSIS — D509 Iron deficiency anemia, unspecified: Secondary | ICD-10-CM | POA: Diagnosis not present

## 2021-10-15 DIAGNOSIS — M6281 Muscle weakness (generalized): Secondary | ICD-10-CM | POA: Diagnosis not present

## 2021-10-18 DIAGNOSIS — I10 Essential (primary) hypertension: Secondary | ICD-10-CM | POA: Diagnosis not present

## 2021-10-18 DIAGNOSIS — E782 Mixed hyperlipidemia: Secondary | ICD-10-CM | POA: Diagnosis not present

## 2021-10-18 DIAGNOSIS — Z87440 Personal history of urinary (tract) infections: Secondary | ICD-10-CM | POA: Diagnosis not present

## 2021-10-18 DIAGNOSIS — G894 Chronic pain syndrome: Secondary | ICD-10-CM | POA: Diagnosis not present

## 2021-10-18 DIAGNOSIS — R350 Frequency of micturition: Secondary | ICD-10-CM | POA: Diagnosis not present

## 2021-10-18 DIAGNOSIS — K589 Irritable bowel syndrome without diarrhea: Secondary | ICD-10-CM | POA: Diagnosis not present

## 2021-10-18 DIAGNOSIS — F02B18 Dementia in other diseases classified elsewhere, moderate, with other behavioral disturbance: Secondary | ICD-10-CM | POA: Diagnosis not present

## 2021-10-18 DIAGNOSIS — Z79891 Long term (current) use of opiate analgesic: Secondary | ICD-10-CM | POA: Diagnosis not present

## 2021-10-18 DIAGNOSIS — M6281 Muscle weakness (generalized): Secondary | ICD-10-CM | POA: Diagnosis not present

## 2021-10-18 DIAGNOSIS — D509 Iron deficiency anemia, unspecified: Secondary | ICD-10-CM | POA: Diagnosis not present

## 2021-10-18 DIAGNOSIS — Z791 Long term (current) use of non-steroidal anti-inflammatories (NSAID): Secondary | ICD-10-CM | POA: Diagnosis not present

## 2021-10-18 DIAGNOSIS — I2510 Atherosclerotic heart disease of native coronary artery without angina pectoris: Secondary | ICD-10-CM | POA: Diagnosis not present

## 2021-10-18 DIAGNOSIS — Z7982 Long term (current) use of aspirin: Secondary | ICD-10-CM | POA: Diagnosis not present

## 2021-10-18 DIAGNOSIS — Z9181 History of falling: Secondary | ICD-10-CM | POA: Diagnosis not present

## 2021-10-18 DIAGNOSIS — G4710 Hypersomnia, unspecified: Secondary | ICD-10-CM | POA: Diagnosis not present

## 2021-10-18 DIAGNOSIS — F02B3 Dementia in other diseases classified elsewhere, moderate, with mood disturbance: Secondary | ICD-10-CM | POA: Diagnosis not present

## 2021-10-21 DIAGNOSIS — F02B18 Dementia in other diseases classified elsewhere, moderate, with other behavioral disturbance: Secondary | ICD-10-CM | POA: Diagnosis not present

## 2021-10-21 DIAGNOSIS — F02B3 Dementia in other diseases classified elsewhere, moderate, with mood disturbance: Secondary | ICD-10-CM | POA: Diagnosis not present

## 2021-10-21 DIAGNOSIS — E782 Mixed hyperlipidemia: Secondary | ICD-10-CM | POA: Diagnosis not present

## 2021-10-21 DIAGNOSIS — I2510 Atherosclerotic heart disease of native coronary artery without angina pectoris: Secondary | ICD-10-CM | POA: Diagnosis not present

## 2021-10-21 DIAGNOSIS — Z79891 Long term (current) use of opiate analgesic: Secondary | ICD-10-CM | POA: Diagnosis not present

## 2021-10-21 DIAGNOSIS — Z87440 Personal history of urinary (tract) infections: Secondary | ICD-10-CM | POA: Diagnosis not present

## 2021-10-21 DIAGNOSIS — Z9181 History of falling: Secondary | ICD-10-CM | POA: Diagnosis not present

## 2021-10-21 DIAGNOSIS — G894 Chronic pain syndrome: Secondary | ICD-10-CM | POA: Diagnosis not present

## 2021-10-21 DIAGNOSIS — Z791 Long term (current) use of non-steroidal anti-inflammatories (NSAID): Secondary | ICD-10-CM | POA: Diagnosis not present

## 2021-10-21 DIAGNOSIS — R350 Frequency of micturition: Secondary | ICD-10-CM | POA: Diagnosis not present

## 2021-10-21 DIAGNOSIS — D509 Iron deficiency anemia, unspecified: Secondary | ICD-10-CM | POA: Diagnosis not present

## 2021-10-21 DIAGNOSIS — Z7982 Long term (current) use of aspirin: Secondary | ICD-10-CM | POA: Diagnosis not present

## 2021-10-21 DIAGNOSIS — G4710 Hypersomnia, unspecified: Secondary | ICD-10-CM | POA: Diagnosis not present

## 2021-10-21 DIAGNOSIS — K589 Irritable bowel syndrome without diarrhea: Secondary | ICD-10-CM | POA: Diagnosis not present

## 2021-10-21 DIAGNOSIS — M6281 Muscle weakness (generalized): Secondary | ICD-10-CM | POA: Diagnosis not present

## 2021-10-21 DIAGNOSIS — I10 Essential (primary) hypertension: Secondary | ICD-10-CM | POA: Diagnosis not present

## 2021-10-22 DIAGNOSIS — G4710 Hypersomnia, unspecified: Secondary | ICD-10-CM | POA: Diagnosis not present

## 2021-10-22 DIAGNOSIS — R350 Frequency of micturition: Secondary | ICD-10-CM | POA: Diagnosis not present

## 2021-10-22 DIAGNOSIS — G894 Chronic pain syndrome: Secondary | ICD-10-CM | POA: Diagnosis not present

## 2021-10-22 DIAGNOSIS — Z9181 History of falling: Secondary | ICD-10-CM | POA: Diagnosis not present

## 2021-10-22 DIAGNOSIS — Z7982 Long term (current) use of aspirin: Secondary | ICD-10-CM | POA: Diagnosis not present

## 2021-10-22 DIAGNOSIS — Z79891 Long term (current) use of opiate analgesic: Secondary | ICD-10-CM | POA: Diagnosis not present

## 2021-10-22 DIAGNOSIS — D509 Iron deficiency anemia, unspecified: Secondary | ICD-10-CM | POA: Diagnosis not present

## 2021-10-22 DIAGNOSIS — F02B18 Dementia in other diseases classified elsewhere, moderate, with other behavioral disturbance: Secondary | ICD-10-CM | POA: Diagnosis not present

## 2021-10-22 DIAGNOSIS — F02B3 Dementia in other diseases classified elsewhere, moderate, with mood disturbance: Secondary | ICD-10-CM | POA: Diagnosis not present

## 2021-10-22 DIAGNOSIS — E782 Mixed hyperlipidemia: Secondary | ICD-10-CM | POA: Diagnosis not present

## 2021-10-22 DIAGNOSIS — K589 Irritable bowel syndrome without diarrhea: Secondary | ICD-10-CM | POA: Diagnosis not present

## 2021-10-22 DIAGNOSIS — Z791 Long term (current) use of non-steroidal anti-inflammatories (NSAID): Secondary | ICD-10-CM | POA: Diagnosis not present

## 2021-10-22 DIAGNOSIS — I2510 Atherosclerotic heart disease of native coronary artery without angina pectoris: Secondary | ICD-10-CM | POA: Diagnosis not present

## 2021-10-22 DIAGNOSIS — I10 Essential (primary) hypertension: Secondary | ICD-10-CM | POA: Diagnosis not present

## 2021-10-22 DIAGNOSIS — Z87440 Personal history of urinary (tract) infections: Secondary | ICD-10-CM | POA: Diagnosis not present

## 2021-10-22 DIAGNOSIS — M6281 Muscle weakness (generalized): Secondary | ICD-10-CM | POA: Diagnosis not present

## 2021-10-28 DIAGNOSIS — I10 Essential (primary) hypertension: Secondary | ICD-10-CM | POA: Diagnosis not present

## 2021-10-28 DIAGNOSIS — D509 Iron deficiency anemia, unspecified: Secondary | ICD-10-CM | POA: Diagnosis not present

## 2021-11-01 DIAGNOSIS — J449 Chronic obstructive pulmonary disease, unspecified: Secondary | ICD-10-CM | POA: Diagnosis not present

## 2021-11-01 DIAGNOSIS — F03A2 Unspecified dementia, mild, with psychotic disturbance: Secondary | ICD-10-CM | POA: Diagnosis not present

## 2021-11-03 DIAGNOSIS — I2510 Atherosclerotic heart disease of native coronary artery without angina pectoris: Secondary | ICD-10-CM | POA: Diagnosis not present

## 2021-11-03 DIAGNOSIS — Z79891 Long term (current) use of opiate analgesic: Secondary | ICD-10-CM | POA: Diagnosis not present

## 2021-11-03 DIAGNOSIS — F02B18 Dementia in other diseases classified elsewhere, moderate, with other behavioral disturbance: Secondary | ICD-10-CM | POA: Diagnosis not present

## 2021-11-03 DIAGNOSIS — D509 Iron deficiency anemia, unspecified: Secondary | ICD-10-CM | POA: Diagnosis not present

## 2021-11-03 DIAGNOSIS — F02B3 Dementia in other diseases classified elsewhere, moderate, with mood disturbance: Secondary | ICD-10-CM | POA: Diagnosis not present

## 2021-11-03 DIAGNOSIS — G4710 Hypersomnia, unspecified: Secondary | ICD-10-CM | POA: Diagnosis not present

## 2021-11-03 DIAGNOSIS — M6281 Muscle weakness (generalized): Secondary | ICD-10-CM | POA: Diagnosis not present

## 2021-11-03 DIAGNOSIS — R350 Frequency of micturition: Secondary | ICD-10-CM | POA: Diagnosis not present

## 2021-11-03 DIAGNOSIS — Z7982 Long term (current) use of aspirin: Secondary | ICD-10-CM | POA: Diagnosis not present

## 2021-11-03 DIAGNOSIS — Z87440 Personal history of urinary (tract) infections: Secondary | ICD-10-CM | POA: Diagnosis not present

## 2021-11-03 DIAGNOSIS — Z9181 History of falling: Secondary | ICD-10-CM | POA: Diagnosis not present

## 2021-11-03 DIAGNOSIS — I10 Essential (primary) hypertension: Secondary | ICD-10-CM | POA: Diagnosis not present

## 2021-11-03 DIAGNOSIS — K589 Irritable bowel syndrome without diarrhea: Secondary | ICD-10-CM | POA: Diagnosis not present

## 2021-11-03 DIAGNOSIS — Z791 Long term (current) use of non-steroidal anti-inflammatories (NSAID): Secondary | ICD-10-CM | POA: Diagnosis not present

## 2021-11-03 DIAGNOSIS — E782 Mixed hyperlipidemia: Secondary | ICD-10-CM | POA: Diagnosis not present

## 2021-11-03 DIAGNOSIS — G894 Chronic pain syndrome: Secondary | ICD-10-CM | POA: Diagnosis not present

## 2021-11-08 DIAGNOSIS — R350 Frequency of micturition: Secondary | ICD-10-CM | POA: Diagnosis not present

## 2021-11-08 DIAGNOSIS — K589 Irritable bowel syndrome without diarrhea: Secondary | ICD-10-CM | POA: Diagnosis not present

## 2021-11-08 DIAGNOSIS — F02B3 Dementia in other diseases classified elsewhere, moderate, with mood disturbance: Secondary | ICD-10-CM | POA: Diagnosis not present

## 2021-11-08 DIAGNOSIS — Z7982 Long term (current) use of aspirin: Secondary | ICD-10-CM | POA: Diagnosis not present

## 2021-11-08 DIAGNOSIS — G894 Chronic pain syndrome: Secondary | ICD-10-CM | POA: Diagnosis not present

## 2021-11-08 DIAGNOSIS — M6281 Muscle weakness (generalized): Secondary | ICD-10-CM | POA: Diagnosis not present

## 2021-11-08 DIAGNOSIS — I2510 Atherosclerotic heart disease of native coronary artery without angina pectoris: Secondary | ICD-10-CM | POA: Diagnosis not present

## 2021-11-08 DIAGNOSIS — G4710 Hypersomnia, unspecified: Secondary | ICD-10-CM | POA: Diagnosis not present

## 2021-11-08 DIAGNOSIS — E782 Mixed hyperlipidemia: Secondary | ICD-10-CM | POA: Diagnosis not present

## 2021-11-08 DIAGNOSIS — Z9181 History of falling: Secondary | ICD-10-CM | POA: Diagnosis not present

## 2021-11-08 DIAGNOSIS — Z79891 Long term (current) use of opiate analgesic: Secondary | ICD-10-CM | POA: Diagnosis not present

## 2021-11-08 DIAGNOSIS — D509 Iron deficiency anemia, unspecified: Secondary | ICD-10-CM | POA: Diagnosis not present

## 2021-11-08 DIAGNOSIS — F02B18 Dementia in other diseases classified elsewhere, moderate, with other behavioral disturbance: Secondary | ICD-10-CM | POA: Diagnosis not present

## 2021-11-08 DIAGNOSIS — Z87440 Personal history of urinary (tract) infections: Secondary | ICD-10-CM | POA: Diagnosis not present

## 2021-11-08 DIAGNOSIS — Z791 Long term (current) use of non-steroidal anti-inflammatories (NSAID): Secondary | ICD-10-CM | POA: Diagnosis not present

## 2021-11-08 DIAGNOSIS — I10 Essential (primary) hypertension: Secondary | ICD-10-CM | POA: Diagnosis not present

## 2021-11-11 DIAGNOSIS — Z791 Long term (current) use of non-steroidal anti-inflammatories (NSAID): Secondary | ICD-10-CM | POA: Diagnosis not present

## 2021-11-11 DIAGNOSIS — Z9181 History of falling: Secondary | ICD-10-CM | POA: Diagnosis not present

## 2021-11-11 DIAGNOSIS — D509 Iron deficiency anemia, unspecified: Secondary | ICD-10-CM | POA: Diagnosis not present

## 2021-11-11 DIAGNOSIS — M6281 Muscle weakness (generalized): Secondary | ICD-10-CM | POA: Diagnosis not present

## 2021-11-11 DIAGNOSIS — I2510 Atherosclerotic heart disease of native coronary artery without angina pectoris: Secondary | ICD-10-CM | POA: Diagnosis not present

## 2021-11-11 DIAGNOSIS — Z7982 Long term (current) use of aspirin: Secondary | ICD-10-CM | POA: Diagnosis not present

## 2021-11-11 DIAGNOSIS — I10 Essential (primary) hypertension: Secondary | ICD-10-CM | POA: Diagnosis not present

## 2021-11-11 DIAGNOSIS — Z87440 Personal history of urinary (tract) infections: Secondary | ICD-10-CM | POA: Diagnosis not present

## 2021-11-11 DIAGNOSIS — G4710 Hypersomnia, unspecified: Secondary | ICD-10-CM | POA: Diagnosis not present

## 2021-11-11 DIAGNOSIS — F02B3 Dementia in other diseases classified elsewhere, moderate, with mood disturbance: Secondary | ICD-10-CM | POA: Diagnosis not present

## 2021-11-11 DIAGNOSIS — K589 Irritable bowel syndrome without diarrhea: Secondary | ICD-10-CM | POA: Diagnosis not present

## 2021-11-11 DIAGNOSIS — F02B18 Dementia in other diseases classified elsewhere, moderate, with other behavioral disturbance: Secondary | ICD-10-CM | POA: Diagnosis not present

## 2021-11-11 DIAGNOSIS — E782 Mixed hyperlipidemia: Secondary | ICD-10-CM | POA: Diagnosis not present

## 2021-11-11 DIAGNOSIS — R350 Frequency of micturition: Secondary | ICD-10-CM | POA: Diagnosis not present

## 2021-11-11 DIAGNOSIS — G894 Chronic pain syndrome: Secondary | ICD-10-CM | POA: Diagnosis not present

## 2021-11-11 DIAGNOSIS — Z79891 Long term (current) use of opiate analgesic: Secondary | ICD-10-CM | POA: Diagnosis not present

## 2021-11-15 DIAGNOSIS — I2510 Atherosclerotic heart disease of native coronary artery without angina pectoris: Secondary | ICD-10-CM | POA: Diagnosis not present

## 2021-11-15 DIAGNOSIS — R350 Frequency of micturition: Secondary | ICD-10-CM | POA: Diagnosis not present

## 2021-11-15 DIAGNOSIS — Z79891 Long term (current) use of opiate analgesic: Secondary | ICD-10-CM | POA: Diagnosis not present

## 2021-11-15 DIAGNOSIS — G4710 Hypersomnia, unspecified: Secondary | ICD-10-CM | POA: Diagnosis not present

## 2021-11-15 DIAGNOSIS — D509 Iron deficiency anemia, unspecified: Secondary | ICD-10-CM | POA: Diagnosis not present

## 2021-11-15 DIAGNOSIS — M6281 Muscle weakness (generalized): Secondary | ICD-10-CM | POA: Diagnosis not present

## 2021-11-15 DIAGNOSIS — F02B18 Dementia in other diseases classified elsewhere, moderate, with other behavioral disturbance: Secondary | ICD-10-CM | POA: Diagnosis not present

## 2021-11-15 DIAGNOSIS — E782 Mixed hyperlipidemia: Secondary | ICD-10-CM | POA: Diagnosis not present

## 2021-11-15 DIAGNOSIS — Z9181 History of falling: Secondary | ICD-10-CM | POA: Diagnosis not present

## 2021-11-15 DIAGNOSIS — F02B3 Dementia in other diseases classified elsewhere, moderate, with mood disturbance: Secondary | ICD-10-CM | POA: Diagnosis not present

## 2021-11-15 DIAGNOSIS — I10 Essential (primary) hypertension: Secondary | ICD-10-CM | POA: Diagnosis not present

## 2021-11-15 DIAGNOSIS — Z791 Long term (current) use of non-steroidal anti-inflammatories (NSAID): Secondary | ICD-10-CM | POA: Diagnosis not present

## 2021-11-15 DIAGNOSIS — G894 Chronic pain syndrome: Secondary | ICD-10-CM | POA: Diagnosis not present

## 2021-11-15 DIAGNOSIS — Z87440 Personal history of urinary (tract) infections: Secondary | ICD-10-CM | POA: Diagnosis not present

## 2021-11-15 DIAGNOSIS — Z7982 Long term (current) use of aspirin: Secondary | ICD-10-CM | POA: Diagnosis not present

## 2021-11-15 DIAGNOSIS — K589 Irritable bowel syndrome without diarrhea: Secondary | ICD-10-CM | POA: Diagnosis not present

## 2021-11-17 DIAGNOSIS — R350 Frequency of micturition: Secondary | ICD-10-CM | POA: Diagnosis not present

## 2021-11-18 ENCOUNTER — Emergency Department (HOSPITAL_COMMUNITY): Payer: Medicare Other

## 2021-11-18 ENCOUNTER — Inpatient Hospital Stay (HOSPITAL_COMMUNITY): Payer: Medicare Other

## 2021-11-18 ENCOUNTER — Encounter (HOSPITAL_COMMUNITY): Payer: Self-pay

## 2021-11-18 ENCOUNTER — Inpatient Hospital Stay (HOSPITAL_COMMUNITY)
Admission: EM | Admit: 2021-11-18 | Discharge: 2021-11-23 | DRG: 871 | Disposition: A | Discharge status: Skilled Nursing Facility | Payer: Medicare Other | Attending: Internal Medicine | Admitting: Internal Medicine

## 2021-11-18 ENCOUNTER — Emergency Department: Payer: Self-pay

## 2021-11-18 ENCOUNTER — Other Ambulatory Visit: Payer: Self-pay

## 2021-11-18 DIAGNOSIS — E669 Obesity, unspecified: Secondary | ICD-10-CM | POA: Diagnosis present

## 2021-11-18 DIAGNOSIS — J189 Pneumonia, unspecified organism: Secondary | ICD-10-CM | POA: Diagnosis present

## 2021-11-18 DIAGNOSIS — I2510 Atherosclerotic heart disease of native coronary artery without angina pectoris: Secondary | ICD-10-CM | POA: Diagnosis not present

## 2021-11-18 DIAGNOSIS — E871 Hypo-osmolality and hyponatremia: Secondary | ICD-10-CM | POA: Diagnosis not present

## 2021-11-18 DIAGNOSIS — I959 Hypotension, unspecified: Secondary | ICD-10-CM | POA: Diagnosis not present

## 2021-11-18 DIAGNOSIS — Z955 Presence of coronary angioplasty implant and graft: Secondary | ICD-10-CM | POA: Diagnosis not present

## 2021-11-18 DIAGNOSIS — J9811 Atelectasis: Secondary | ICD-10-CM | POA: Diagnosis not present

## 2021-11-18 DIAGNOSIS — H353222 Exudative age-related macular degeneration, left eye, with inactive choroidal neovascularization: Secondary | ICD-10-CM | POA: Diagnosis not present

## 2021-11-18 DIAGNOSIS — Z7189 Other specified counseling: Secondary | ICD-10-CM

## 2021-11-18 DIAGNOSIS — G9341 Metabolic encephalopathy: Secondary | ICD-10-CM | POA: Diagnosis not present

## 2021-11-18 DIAGNOSIS — Z888 Allergy status to other drugs, medicaments and biological substances status: Secondary | ICD-10-CM

## 2021-11-18 DIAGNOSIS — Z96653 Presence of artificial knee joint, bilateral: Secondary | ICD-10-CM | POA: Diagnosis present

## 2021-11-18 DIAGNOSIS — K219 Gastro-esophageal reflux disease without esophagitis: Secondary | ICD-10-CM | POA: Diagnosis not present

## 2021-11-18 DIAGNOSIS — Z7982 Long term (current) use of aspirin: Secondary | ICD-10-CM

## 2021-11-18 DIAGNOSIS — H9042 Sensorineural hearing loss, unilateral, left ear, with unrestricted hearing on the contralateral side: Secondary | ICD-10-CM | POA: Diagnosis present

## 2021-11-18 DIAGNOSIS — R6521 Severe sepsis with septic shock: Secondary | ICD-10-CM | POA: Diagnosis present

## 2021-11-18 DIAGNOSIS — E8809 Other disorders of plasma-protein metabolism, not elsewhere classified: Secondary | ICD-10-CM | POA: Diagnosis present

## 2021-11-18 DIAGNOSIS — Z6830 Body mass index (BMI) 30.0-30.9, adult: Secondary | ICD-10-CM | POA: Diagnosis not present

## 2021-11-18 DIAGNOSIS — Z743 Need for continuous supervision: Secondary | ICD-10-CM | POA: Diagnosis not present

## 2021-11-18 DIAGNOSIS — Z91012 Allergy to eggs: Secondary | ICD-10-CM

## 2021-11-18 DIAGNOSIS — Z792 Long term (current) use of antibiotics: Secondary | ICD-10-CM

## 2021-11-18 DIAGNOSIS — E785 Hyperlipidemia, unspecified: Secondary | ICD-10-CM | POA: Diagnosis not present

## 2021-11-18 DIAGNOSIS — F419 Anxiety disorder, unspecified: Secondary | ICD-10-CM | POA: Diagnosis present

## 2021-11-18 DIAGNOSIS — Z880 Allergy status to penicillin: Secondary | ICD-10-CM

## 2021-11-18 DIAGNOSIS — N179 Acute kidney failure, unspecified: Secondary | ICD-10-CM | POA: Diagnosis not present

## 2021-11-18 DIAGNOSIS — H353114 Nonexudative age-related macular degeneration, right eye, advanced atrophic with subfoveal involvement: Secondary | ICD-10-CM | POA: Diagnosis not present

## 2021-11-18 DIAGNOSIS — F03A0 Unspecified dementia, mild, without behavioral disturbance, psychotic disturbance, mood disturbance, and anxiety: Secondary | ICD-10-CM | POA: Diagnosis present

## 2021-11-18 DIAGNOSIS — R0902 Hypoxemia: Secondary | ICD-10-CM | POA: Diagnosis not present

## 2021-11-18 DIAGNOSIS — Z9181 History of falling: Secondary | ICD-10-CM | POA: Diagnosis not present

## 2021-11-18 DIAGNOSIS — Z515 Encounter for palliative care: Secondary | ICD-10-CM

## 2021-11-18 DIAGNOSIS — R569 Unspecified convulsions: Secondary | ICD-10-CM | POA: Diagnosis not present

## 2021-11-18 DIAGNOSIS — I252 Old myocardial infarction: Secondary | ICD-10-CM

## 2021-11-18 DIAGNOSIS — D649 Anemia, unspecified: Secondary | ICD-10-CM | POA: Diagnosis not present

## 2021-11-18 DIAGNOSIS — R278 Other lack of coordination: Secondary | ICD-10-CM | POA: Diagnosis not present

## 2021-11-18 DIAGNOSIS — J440 Chronic obstructive pulmonary disease with acute lower respiratory infection: Secondary | ICD-10-CM | POA: Diagnosis not present

## 2021-11-18 DIAGNOSIS — Z66 Do not resuscitate: Secondary | ICD-10-CM | POA: Diagnosis present

## 2021-11-18 DIAGNOSIS — J90 Pleural effusion, not elsewhere classified: Secondary | ICD-10-CM | POA: Diagnosis not present

## 2021-11-18 DIAGNOSIS — Z809 Family history of malignant neoplasm, unspecified: Secondary | ICD-10-CM

## 2021-11-18 DIAGNOSIS — M1990 Unspecified osteoarthritis, unspecified site: Secondary | ICD-10-CM | POA: Diagnosis present

## 2021-11-18 DIAGNOSIS — R4182 Altered mental status, unspecified: Secondary | ICD-10-CM | POA: Diagnosis present

## 2021-11-18 DIAGNOSIS — I10 Essential (primary) hypertension: Secondary | ICD-10-CM | POA: Diagnosis present

## 2021-11-18 DIAGNOSIS — N390 Urinary tract infection, site not specified: Secondary | ICD-10-CM | POA: Diagnosis not present

## 2021-11-18 DIAGNOSIS — Z885 Allergy status to narcotic agent status: Secondary | ICD-10-CM

## 2021-11-18 DIAGNOSIS — M6281 Muscle weakness (generalized): Secondary | ICD-10-CM | POA: Diagnosis not present

## 2021-11-18 DIAGNOSIS — R404 Transient alteration of awareness: Secondary | ICD-10-CM | POA: Diagnosis not present

## 2021-11-18 DIAGNOSIS — R410 Disorientation, unspecified: Secondary | ICD-10-CM | POA: Diagnosis not present

## 2021-11-18 DIAGNOSIS — Z7952 Long term (current) use of systemic steroids: Secondary | ICD-10-CM

## 2021-11-18 DIAGNOSIS — J449 Chronic obstructive pulmonary disease, unspecified: Secondary | ICD-10-CM | POA: Diagnosis not present

## 2021-11-18 DIAGNOSIS — Z741 Need for assistance with personal care: Secondary | ICD-10-CM | POA: Diagnosis not present

## 2021-11-18 DIAGNOSIS — R6889 Other general symptoms and signs: Secondary | ICD-10-CM | POA: Diagnosis not present

## 2021-11-18 DIAGNOSIS — E876 Hypokalemia: Secondary | ICD-10-CM | POA: Diagnosis not present

## 2021-11-18 DIAGNOSIS — A419 Sepsis, unspecified organism: Secondary | ICD-10-CM | POA: Diagnosis not present

## 2021-11-18 DIAGNOSIS — I517 Cardiomegaly: Secondary | ICD-10-CM | POA: Diagnosis not present

## 2021-11-18 DIAGNOSIS — R488 Other symbolic dysfunctions: Secondary | ICD-10-CM | POA: Diagnosis not present

## 2021-11-18 DIAGNOSIS — Z1152 Encounter for screening for COVID-19: Secondary | ICD-10-CM

## 2021-11-18 DIAGNOSIS — Z79899 Other long term (current) drug therapy: Secondary | ICD-10-CM

## 2021-11-18 DIAGNOSIS — H353221 Exudative age-related macular degeneration, left eye, with active choroidal neovascularization: Secondary | ICD-10-CM | POA: Diagnosis not present

## 2021-11-18 DIAGNOSIS — Z8674 Personal history of sudden cardiac arrest: Secondary | ICD-10-CM

## 2021-11-18 DIAGNOSIS — R262 Difficulty in walking, not elsewhere classified: Secondary | ICD-10-CM | POA: Diagnosis not present

## 2021-11-18 DIAGNOSIS — H401131 Primary open-angle glaucoma, bilateral, mild stage: Secondary | ICD-10-CM | POA: Diagnosis not present

## 2021-11-18 LAB — CBC WITH DIFFERENTIAL/PLATELET
Abs Immature Granulocytes: 0.33 10*3/uL — ABNORMAL HIGH (ref 0.00–0.07)
Basophils Absolute: 0 10*3/uL (ref 0.0–0.1)
Basophils Relative: 0 %
Eosinophils Absolute: 0 10*3/uL (ref 0.0–0.5)
Eosinophils Relative: 0 %
HCT: 36 % (ref 36.0–46.0)
Hemoglobin: 11.4 g/dL — ABNORMAL LOW (ref 12.0–15.0)
Immature Granulocytes: 2 %
Lymphocytes Relative: 9 %
Lymphs Abs: 2.1 10*3/uL (ref 0.7–4.0)
MCH: 29.5 pg (ref 26.0–34.0)
MCHC: 31.7 g/dL (ref 30.0–36.0)
MCV: 93 fL (ref 80.0–100.0)
Monocytes Absolute: 2.9 10*3/uL — ABNORMAL HIGH (ref 0.1–1.0)
Monocytes Relative: 13 %
Neutro Abs: 16.6 10*3/uL — ABNORMAL HIGH (ref 1.7–7.7)
Neutrophils Relative %: 76 %
Platelets: 259 10*3/uL (ref 150–400)
RBC: 3.87 MIL/uL (ref 3.87–5.11)
RDW: 17.9 % — ABNORMAL HIGH (ref 11.5–15.5)
WBC: 22 10*3/uL — ABNORMAL HIGH (ref 4.0–10.5)
nRBC: 0 % (ref 0.0–0.2)

## 2021-11-18 LAB — COMPREHENSIVE METABOLIC PANEL
ALT: 13 U/L (ref 0–44)
AST: 22 U/L (ref 15–41)
Albumin: 2.9 g/dL — ABNORMAL LOW (ref 3.5–5.0)
Alkaline Phosphatase: 93 U/L (ref 38–126)
Anion gap: 12 (ref 5–15)
BUN: 50 mg/dL — ABNORMAL HIGH (ref 8–23)
CO2: 24 mmol/L (ref 22–32)
Calcium: 9.2 mg/dL (ref 8.9–10.3)
Chloride: 97 mmol/L — ABNORMAL LOW (ref 98–111)
Creatinine, Ser: 1.84 mg/dL — ABNORMAL HIGH (ref 0.44–1.00)
GFR, Estimated: 26 mL/min — ABNORMAL LOW (ref >60)
Glucose, Bld: 161 mg/dL — ABNORMAL HIGH (ref 70–99)
Potassium: 4.9 mmol/L (ref 3.5–5.1)
Sodium: 133 mmol/L — ABNORMAL LOW (ref 135–145)
Total Bilirubin: 0.8 mg/dL (ref 0.3–1.2)
Total Protein: 6.5 g/dL (ref 6.5–8.1)

## 2021-11-18 LAB — URINALYSIS, ROUTINE W REFLEX MICROSCOPIC
Bilirubin Urine: NEGATIVE
Glucose, UA: NEGATIVE mg/dL
Hgb urine dipstick: NEGATIVE
Ketones, ur: NEGATIVE mg/dL
Leukocytes,Ua: NEGATIVE
Nitrite: NEGATIVE
Protein, ur: 100 mg/dL — AB
Specific Gravity, Urine: 1.028 (ref 1.005–1.030)
pH: 5 (ref 5.0–8.0)

## 2021-11-18 LAB — GLUCOSE, CAPILLARY
Glucose-Capillary: 159 mg/dL — ABNORMAL HIGH (ref 70–99)
Glucose-Capillary: 122 mg/dL — ABNORMAL HIGH (ref 70–99)
Glucose-Capillary: 117 mg/dL — ABNORMAL HIGH (ref 70–99)

## 2021-11-18 LAB — MRSA NEXT GEN BY PCR, NASAL: MRSA by PCR Next Gen: NOT DETECTED

## 2021-11-18 LAB — RESP PANEL BY RT-PCR (FLU A&B, COVID) ARPGX2
Influenza A by PCR: NEGATIVE
Influenza B by PCR: NEGATIVE
SARS Coronavirus 2 by RT PCR: NEGATIVE

## 2021-11-18 LAB — APTT: aPTT: 46 seconds — ABNORMAL HIGH (ref 24–36)

## 2021-11-18 LAB — PROTIME-INR
INR: 1.3 — ABNORMAL HIGH (ref 0.8–1.2)
Prothrombin Time: 15.6 seconds — ABNORMAL HIGH (ref 11.4–15.2)

## 2021-11-18 LAB — LACTIC ACID, PLASMA
Lactic Acid, Venous: 3.2 mmol/L (ref 0.5–1.9)
Lactic Acid, Venous: 2.2 mmol/L (ref 0.5–1.9)

## 2021-11-18 MED ORDER — ACETAMINOPHEN 325 MG PO TABS
650.0000 mg | ORAL_TABLET | Freq: Four times a day (QID) | ORAL | Status: DC | PRN
  Administered 2021-11-20 – 2021-11-23 (×4): 650 mg via ORAL
  Filled 2021-11-18 (×4): qty 2

## 2021-11-18 MED ORDER — ORAL CARE MOUTH RINSE
15.0000 mL | OROMUCOSAL | Status: DC
  Administered 2021-11-18 – 2021-11-19 (×4): 15 mL via OROMUCOSAL

## 2021-11-18 MED ORDER — LACTATED RINGERS IV BOLUS
1000.0000 mL | Freq: Once | INTRAVENOUS | Status: AC
  Administered 2021-11-18: 1000 mL via INTRAVENOUS

## 2021-11-18 MED ORDER — SODIUM CHLORIDE 0.9 % IV SOLN
500.0000 mg | Freq: Once | INTRAVENOUS | Status: AC
  Administered 2021-11-18: 500 mg via INTRAVENOUS
  Filled 2021-11-18: qty 5

## 2021-11-18 MED ORDER — ACETAMINOPHEN 650 MG RE SUPP: 650.0000 mg | Freq: Four times a day (QID) | RECTAL | Status: DC | PRN

## 2021-11-18 MED ORDER — SODIUM CHLORIDE 0.9 % IV SOLN
500.0000 mg | INTRAVENOUS | Status: DC
  Administered 2021-11-19 – 2021-11-22 (×4): 500 mg via INTRAVENOUS
  Filled 2021-11-18 (×5): qty 5

## 2021-11-18 MED ORDER — LATANOPROST 0.005 % OP SOLN
1.0000 [drp] | Freq: Every day | OPHTHALMIC | Status: DC
  Administered 2021-11-18 – 2021-11-22 (×5): 1 [drp] via OPHTHALMIC
  Filled 2021-11-18 (×2): qty 2.5

## 2021-11-18 MED ORDER — LACTATED RINGERS IV BOLUS (SEPSIS)
1000.0000 mL | Freq: Once | INTRAVENOUS | Status: AC
  Administered 2021-11-18: 1000 mL via INTRAVENOUS

## 2021-11-18 MED ORDER — ORAL CARE MOUTH RINSE: 15.0000 mL | OROMUCOSAL | Status: DC | PRN

## 2021-11-18 MED ORDER — HEPARIN SODIUM (PORCINE) 5000 UNIT/ML IJ SOLN
5000.0000 [IU] | Freq: Three times a day (TID) | INTRAMUSCULAR | Status: DC
  Administered 2021-11-18 – 2021-11-23 (×16): 5000 [IU] via SUBCUTANEOUS
  Filled 2021-11-18 (×16): qty 1

## 2021-11-18 MED ORDER — SODIUM CHLORIDE 0.9 % IV SOLN
2.0000 g | INTRAVENOUS | Status: DC
  Administered 2021-11-18 – 2021-11-23 (×6): 2 g via INTRAVENOUS
  Filled 2021-11-18 (×6): qty 20

## 2021-11-18 MED ORDER — CHLORHEXIDINE GLUCONATE CLOTH 2 % EX PADS
6.0000 | MEDICATED_PAD | Freq: Every day | CUTANEOUS | Status: DC
  Administered 2021-11-18 – 2021-11-23 (×6): 6 via TOPICAL

## 2021-11-18 MED ORDER — ONDANSETRON HCL 4 MG PO TABS: 4.0000 mg | ORAL_TABLET | Freq: Four times a day (QID) | ORAL | Status: DC | PRN

## 2021-11-18 MED ORDER — LACTATED RINGERS IV SOLN: INTRAVENOUS | Status: AC

## 2021-11-18 MED ORDER — NOREPINEPHRINE 4 MG/250ML-% IV SOLN
0.0000 ug/min | INTRAVENOUS | Status: DC
  Administered 2021-11-18: 7 ug/min via INTRAVENOUS
  Administered 2021-11-18: 2 ug/min via INTRAVENOUS
  Administered 2021-11-18: 13 ug/min via INTRAVENOUS
  Filled 2021-11-18 (×3): qty 250

## 2021-11-18 MED ORDER — ONDANSETRON HCL 4 MG/2ML IJ SOLN
4.0000 mg | Freq: Four times a day (QID) | INTRAMUSCULAR | Status: DC | PRN
  Administered 2021-11-21: 4 mg via INTRAVENOUS
  Filled 2021-11-18: qty 2

## 2021-11-18 MED ORDER — GUAIFENESIN 100 MG/5ML PO LIQD
5.0000 mL | ORAL | Status: DC | PRN
  Administered 2021-11-19 (×2): 5 mL via ORAL
  Filled 2021-11-18 (×2): qty 5

## 2021-11-18 MED ORDER — VASOPRESSIN 20 UNITS/100 ML INFUSION FOR SHOCK
0.0000 [IU]/min | INTRAVENOUS | Status: DC
  Administered 2021-11-18: 0.03 [IU]/min via INTRAVENOUS
  Filled 2021-11-18: qty 100

## 2021-11-18 NOTE — ED Triage Notes (Signed)
Pt diagnosed with UTI on 11/15 and began ABX treatment. Tonight family found pt slumped over in chair and confused. Pts baseline is ambulatory with assistance and A&Ox4. Bp was also in low 100s. Pt confused upon arrival and not answering questions.

## 2021-11-18 NOTE — ED Notes (Signed)
Date and time results received: 11/18/21 0628   Test: Lactic Critical Value: 3.2  Name of Provider Notified: [facility]  Orders Received? Or Actions Taken?: NA

## 2021-11-18 NOTE — H&P (Addendum)
History and Physical  [name]:[id number] DOB: 01/16/1932 DOA: 11/18/2021  Referring physician: Marily Memos MD PCP: Benita Stabile, MD   Chief Complaint: Altered Mental Status with hypotension  HPI: Beverly [name] is a 88 y.o. female wit ha significant history of HTN, seizure post fall with TBI, MI, HLD, and mild dementia presenting today with AMS with hypotension in the setting of UTI. Surgical history includes bilateral knee replacements and cranial surgery for hematoma post fall. History was obtained by daughter at bedside due to patient's AMS. Patient lives with her daughter and was at her baseline (alert and oriented with mild dementia) on Monday 11/15/21 and was able to walk and participate with home PT. On Tuesday she became weaker and could not verbalize her complaints, telling her daughter "I'm not sure how to answer that." She became increasingly confused throughout the day. She has constant sitters and home health who noted this change. She was only slightly better Wednesday morning and was found to have a UTI and was given antibiotics. When the daughter came home Wednesday night, the patient was minimally responsive. Later in the night, she began trying to get out of the chair she sleeps in and was acting vastly different than her baseline. Her daughter took her blood pressure and found it to be low at 70s/50s. She called EMS who recommended transport.  In the ED, WBC was 22, lactic acid was 3.2, and her blood pressure has been consistently low. Kidney labs showed AKI. She was given fluids, Rocephin and azithromycin for infection, and levophed for blood pressure. CVC line was placed due to the need for additional pressors. She was placed on 2.5L [place] due to low O2 sats, she is not normally on O2.  Today, Beverly [name] is minimally reactive to conversation. She is confused, alert only to self. Daughter is at bedside and provides history. Daughter confirms Beverly [name] has a DNR and her  wishes are limited to no CPR and no intubation. Palliative care sees the patient once a month at home, and daughter consents to them being involved in this hospital stay.   Review of Systems: Unable to obtain due to AMS  Past Medical History:  Diagnosis Date   Anxiety    Arthritis    COPD (chronic obstructive pulmonary disease) (HCC)    Coronary artery disease    a. STEMI with cardiac arrest (polymorphic VT) s/p DES to RCA.   GERD (gastroesophageal reflux disease)    Headache(784.0)    Hearing loss, central    Left ear   Hematoma    Hypertension    Memory changes    Mild aortic stenosis    Mild carotid artery disease (HCC)    a. 1-39% by duplex 02/2018.   Seizures (HCC) 10/2012   No seizures, speech issues; after a fall, hitting head on left side   UTI (urinary tract infection)    June 2022   Past Surgical History:  Procedure Laterality Date   ABDOMINAL HYSTERECTOMY     APPENDECTOMY     CARDIAC CATHETERIZATION     15 yrs ago   CORONARY/GRAFT ACUTE MI REVASCULARIZATION N/A 01/10/2018   Procedure: Coronary/Graft Acute MI Revascularization;  Surgeon: Runell Gess, MD;  Location: [facility];  Service: Cardiovascular;  Laterality: N/A;   CRANIOTOMY Left 10/30/2012   Procedure: CRANIOTOMY HEMATOMA EVACUATION SUBDURAL;  Surgeon: Maeola Harman, MD;  Location: [facility];  Service: Neurosurgery;  Laterality: Left;  Left Craniotomy for  evacuation of subdural hematoma   JOINT REPLACEMENT Bilateral    knees   LEFT HEART CATH AND CORONARY ANGIOGRAPHY N/A 01/10/2018   Procedure: LEFT HEART CATH AND CORONARY ANGIOGRAPHY;  Surgeon: Runell Gess, MD;  Location: [facility];  Service: Cardiovascular;  Laterality: N/A;   Social History:  reports that she has never smoked. She has never used smokeless tobacco. She reports that she does not drink alcohol and does not use drugs.  Allergies  Allergen Reactions   Lisinopril Swelling   Eggs Or Egg-Derived Products Itching     Patient reports that she can eat eggs but does not tolerate egg derived products   Morphine And Related     Headache   Penicillins Itching    DID THE REACTION INVOLVE: Swelling of the face/tongue/throat, SOB, or low BP? No Sudden or severe rash/hives, skin peeling, or the inside of the mouth or nose? No Did it require medical treatment? No When did it last happen? Unknown If all above answers are "NO", may proceed with cephalosporin use.     Family History  Problem Relation Age of Onset   Dementia Mother    Cancer Father    Dementia Maternal Aunt    Ataxia Neg Hx    Chorea Neg Hx    Mental retardation Neg Hx    Migraines Neg Hx    Multiple sclerosis Neg Hx    Neurofibromatosis Neg Hx    Neuropathy Neg Hx    Parkinsonism Neg Hx    Seizures Neg Hx    Stroke Neg Hx     Prior to Admission medications   Medication Sig Start Date End Date Taking? Authorizing Provider  acetaminophen (TYLENOL) 500 MG tablet Take 1 tablet (500 mg total) by mouth as needed (no more than 3 gm / day). 12/04/19   Ronney Asters, NP  ALPRAZolam Prudy Feeler) 0.5 MG tablet Take 0.5 mg by mouth 3 (three) times daily as needed for anxiety.    [provider]  amLODipine (NORVASC) 5 MG tablet Take 1 tablet (5 mg total) by mouth daily. 05/29/19 12/04/19  Jonelle Sidle, MD  aspirin 81 MG chewable tablet Chew 1 tablet (81 mg total) by mouth daily. 01/13/18   Filbert Schilder, NP  atorvastatin (LIPITOR) 80 MG tablet Take 1 tablet (80 mg total) by mouth daily at 6 PM. 01/12/18   Georgie Chard D, NP  carvedilol (COREG) 6.25 MG tablet TAKE ONE TABLET (6.25MG  TOTAL) BY MOUTH TWO TIMES DAILY 04/10/20   Jonelle Sidle, MD  cefdinir (OMNICEF) 300 MG capsule Take 1 capsule (300 mg total) by mouth every 12 (twelve) hours. 05/09/20   Catarina Hartshorn, MD  celecoxib (CELEBREX) 200 MG capsule Take 200 mg by mouth daily.    [provider]  cholecalciferol (VITAMIN D) 1000 UNITS tablet Take 1,000 Units by mouth  daily.     [provider]  clotrimazole (MYCELEX) 10 MG troche Take 1 tablet (10 mg total) by mouth 5 (five) times daily. 05/09/20   Catarina Hartshorn, MD  dicyclomine (BENTYL) 20 MG tablet Take 20 mg by mouth in the morning and at bedtime. 03/01/21   [provider]  doxycycline (VIBRA-TABS) 100 MG tablet Take 1 tablet (100 mg total) by mouth every 12 (twelve) hours. 05/09/20   Catarina Hartshorn, MD  fluticasone (FLONASE) 50 MCG/ACT nasal spray Place 1 spray into both nostrils daily.  09/18/12   [provider]  HYDROcodone-acetaminophen (NORCO) 10-325 MG tablet Take 1  tablet by mouth every 6 (six) hours as needed.    [provider]  latanoprost (XALATAN) 0.005 % ophthalmic solution INSTILL ONE DROP IN BOTH EYES NIGHTLY 05/06/20   Rankin, Alford HighlandGary A, MD  levETIRAcetam (KEPPRA) 500 MG tablet Take 500 mg by mouth 2 (two) times daily.    [provider]  memantine (NAMENDA) 5 MG tablet Take 5 mg by mouth 2 (two) times daily. 03/01/21   [provider]  methocarbamol (ROBAXIN) 500 MG tablet Take 1 tablet by mouth in the morning and at bedtime.  02/22/19   [provider]  nitroGLYCERIN (NITROSTAT) 0.4 MG SL tablet Place 1 tablet (0.4 mg total) under the tongue every 5 (five) minutes x 3 doses as needed for chest pain. 01/12/18   Georgie ChardMcDaniel, Jill D, NP  pantoprazole (PROTONIX) 40 MG tablet Take 40 mg by mouth daily.    [provider]  potassium chloride (K-DUR) 10 MEQ tablet Take 10 mEq by mouth daily.     [provider]  predniSONE (DELTASONE) 50 MG tablet Take 1 tablet (50 mg total) by mouth daily with breakfast. Patient taking differently: Take 50 mg by mouth as needed. 05/09/20   Catarina Hartshornat, David, MD  Propylene Glycol (SYSTANE BALANCE OP) Place 1-2 drops into both eyes daily as needed (dry eyes).    [provider]  risperiDONE (RISPERDAL) 0.5 MG tablet Take 0.5 mg by mouth 2 (two) times daily. 04/23/20   [provider]  lisinopril  (ZESTRIL) 20 MG tablet Take 1 tablet (20 mg total) by mouth 2 (two) times daily. 01/24/19 05/03/19  Ellsworth LennoxStrader, Brittany M, PA-C   Physical Exam: Vitals:   11/18/21 0730 11/18/21 0745 11/18/21 0748 11/18/21 0800  BP: (!) 99/56 (!) 89/50 (!) 86/57 (!) 104/55  Pulse: 78 75 80 76  Resp: 13 13 12 14   Temp:      TempSrc:      SpO2: 97% 98% 93% 96%  Weight:      Height:        General exam: Moderately built and nourished patient, lying comfortably supine on the gurney in no obvious distress. Head, eyes and ENT: Nontraumatic and normocephalic. Pupils equally reacting to light and accommodation. Oral mucosa moist. Neck: Supple. No JVD, carotid bruit or thyromegaly. Lymphatics: No lymphadenopathy. Respiratory system: Clear to auscultation. Mildly increased work of breathing. Cardiovascular system: S1 and S2 heard, RRR. No JVD, murmurs, gallops, clicks or pedal edema. Gastrointestinal system: Abdomen is nondistended, soft and nontender. Normal bowel sounds heard. No organomegaly or masses appreciated. Central nervous system: Alert and oriented only to self. No gross focal neurological deficits. Extremities: Unable to assess strength due to AMS  Skin: No rashes or acute findings. Musculoskeletal system: Negative exam, patient daughter states she is normally kyphotic, but is laying flat on the bed which is not normal for her.  Psychiatry: Minimally responsive.  Labs on Admission:  Basic Metabolic Panel: Recent Labs  Lab 11/18/21 0516  NA 133*  K 4.9  CL 97*  CO2 24  GLUCOSE 161*  BUN 50*  CREATININE 1.84*  CALCIUM 9.2   Liver Function Tests: Recent Labs  Lab 11/18/21 0516  AST 22  ALT 13  ALKPHOS 93  BILITOT 0.8  PROT 6.5  ALBUMIN 2.9*   No results for input(s): "LIPASE", "AMYLASE" in the last 168 hours. No results for input(s): "AMMONIA" in the last 168 hours. CBC: Recent Labs  Lab 11/18/21 0516  WBC 22.0*  NEUTROABS 16.6*  HGB 11.4*  HCT  36.0  MCV 93.0  PLT 259    Cardiac Enzymes: No results for input(s): "CKTOTAL", "CKMB", "CKMBINDEX", "TROPONINI" in the last 168 hours.  BNP (last 3 results) No results for input(s): "PROBNP" in the last 8760 hours. CBG: No results for input(s): "GLUCAP" in the last 168 hours.  Radiological Exams on Admission: [place] EKG SITE RITE  Result Date: 11/18/2021 If Site Rite image not attached, placement could not be confirmed due to current cardiac rhythm.  DG Chest Port 1 View  Result Date: 11/18/2021 CLINICAL DATA:  88 year old female with possible sepsis. EXAM: PORTABLE CHEST 1 VIEW COMPARISON:  Chest x-ray 05/01/2020. FINDINGS: Today's study is limited by considerable patient rotation to the right and slumped kyphotic positioning, which grossly distorts normal anatomical structures. With these limitations in mind, lung volumes are low. Opacity at the left base which may reflect atelectasis and/or consolidation, with superimposed small left pleural effusion. Within the limitations of today's examination, the right lung appears clear. No definite right pleural effusion. No pneumothorax. No evidence of pulmonary edema. Heart size is mildly enlarged. Mediastinal contours are grossly distorted. IMPRESSION: 1. Limited examination demonstrating low lung volumes with probable left left basilar subsegmental atelectasis and small left pleural effusion. Electronically Signed   By: Trudie Reed M.D.   On: 11/18/2021 05:41    EKG: Independently reviewed.   Assessment/Plan Principal Problem:   Septic shock (HCC)  Septic Shock secondary to UTI and/or Pneumonia -Continue IV Rocephin and IV Azithromycin -Continue IV levophed and vasopressin for blood pressure.  -Continue Lactated ringers infusion for blood pressure -Monitor WBC with CBC daily -Monitor Lactic acid for clearance (2.2 on 11/16) -High Likelihood of mortality given patient presentation --Daughter counseled and is understanding --Continue to provide family  support --Palliative care consulted   Acute Metabolic Encephalopathy secondary to sepsis -Daughter is at bedside and is decision maker -Monitor electrolytes with BMP daily -Monitor WBC with CBC daily -Continue to monitor for improvement of function.  -NPO due to AMS  AKI secondary to sepsis -Avoid nephrotoxic agents where possible. -Monitor Cr and BUN with BMP daily. -Creatine baseline 0.4 as of 05/09/20 -Current Creatine 1.84 on 11/18/21  Hyponatremia -Mild at 133 on presentation 11/16 -Monitor electrolytes with BMP daily  Hypoalbuminemia -2.9 on presentation 11/16 -Monitor with BMP daily  HTN -Hold home BP medication due to current hypotension  Seizure Hx -post fall with TBI -Daughter states no seizure since the one after her fall -Continue to monitor  History of MI -Hold Aspirin and amlodipine while in ICU and NPO  HLD -Hold atorvastatin while NPO  Mild dementia -Hold Namenda and other dementia agents while NPO  GERD -Hold Protonix while NPO  DVT Prophylaxis: Heparin SQ Code Status: DNR  Family Communication: Daughter at bedside  Disposition Plan: Pending clinical improvement, palliative recs, and family decision   Time spent:  Jake Shark, OMS IV [facility] How to contact the [facility] Attending or Consulting provider 7A - 7P or covering provider during after hours 7P -7A, for this patient?  Check the care team in [facility] and look for a) attending/consulting [facility] provider listed and b) the [facility] team listed Log into [email or web address] and use 's universal password to access. If you do not have the password, please contact the hospital operator. Locate the [facility] provider you are looking for under [facility] and page to a number that you can be directly reached. If you still have difficulty reaching the provider, please page the [facility] (Director on Call)  for the Hospitalists listed on amion for assistance.

## 2021-11-18 NOTE — Plan of Care (Signed)

## 2021-11-18 NOTE — Consult Note (Signed)
Consultation Note Date: 11/18/2021   Patient Name: Beverly [name]  DOB: 18-Mar-1932  MRN: [id number]  Age / Sex: 88 y.o., female  PCP: Benita Stabile, MD Referring Physician: Erick Blinks, DO  Reason for Consultation: Establishing goals of care  HPI/Patient Profile: 88 y.o. female  with past medical history of HTN/HLD, mild dementia, COPD, CAD, GERD, arthritis, anxiety, seizure post fall with TBI, MI-STEMI with cardiac arrest January 2020,, bilateral knee replacement, abdominal hysterectomy, appendectomy, admitted on 11/18/2021 with septic shock/UTI.   Clinical Assessment and Goals of Care: I have reviewed medical records including EPIC notes, labs and imaging, received report from RN, assessed the patient.    We meet at bedside along with daughter/healthcare surrogate, [name] to discuss diagnosis prognosis, GOC, EOL wishes, disposition and options.  I introduced Palliative Medicine as specialized medical care for people living with serious illness. It focuses on providing relief from the symptoms and stress of a serious illness. The goal is to improve quality of life for both the patient and the family.  We discussed a brief life review of the patient.  [name] states that she and her husband about Beverly [name]'s home about 4-1/2 years ago and moved in with her.  Beverly [name] has 24/7 care at home, some of it by paid caregivers.   [name] shares that her mother had been having physical therapy and showing improvements.  She states that her mother has been able to walk.  She is independent with some ADLs, but has assistance with bathing and dressing as needed.   We then focused on their current illness.  We talk about sepsis and the treatment plan.  At this point [name] shares that her mother was this sick when hospitalized May 2022.  She states that her mother was able to recover at that time.  We talk about time for  outcomes.  At this point daughter request that we continue to treat in an effort for recovery.   The natural disease trajectory and expectations at EOL were discussed.  Advanced directives, concepts specific to code status, artifical feeding and hydration, and rehospitalization were considered and discussed.  Daughter verifies DNR.  No CPR or intubation, no defibrillation.  Agreeable to ACLS medications and BiPAP.  Hospice and Palliative Care services outpatient were discussed.  [name] was on hospice care for about 3 months until she graduated to palliative care.  She is served by hosp[facility] of [facility] for outpatient palliative services, NP Derry providing care.  Discussed the importance of continued conversation with family and the medical providers regarding overall plan of care and treatment options, ensuring decisions are within the context of the patient's values and GOCs.  Questions and concerns were addressed.  The family was encouraged to call with questions or concerns.  PMT will continue to support holistically.  Conference with attending, bedside nursing staff, transition of care team related to patient condition, needs, goals of care, disposition.   HCPOA NEXT OF KIN -daughter, [name].    SUMMARY  OF RECOMMENDATIONS   Continue to treat the treatable but no CPR, intubation, defibrillation. Agreeable to the use of vasopressors as needed, BiPAP as needed. Time for outcomes. Active with outpatient palliative services with [facility].   Code Status/Advance Care Planning: DNR -continue to treat the treatable but no CPR or intubation.  Time for outcomes.  Agreeable to the use of BiPAP and ACLS medications.  Symptom Management:  Per hospitalist, no additional needs at this time.  Palliative Prophylaxis:  Frequent Pain Assessment, Oral Care, and Turn Reposition  Additional Recommendations (Limitations, Scope, Preferences): Continue to treat but no CPR,  intubation, defibrillation.  Psycho-social/Spiritual:  Desire for further Chaplaincy support:no Additional Recommendations: Caregiving  Support/Resources  Prognosis:  Unable to determine, based on outcomes.  Guarded for the next 24 to 48 hours.  Discharge Planning: To be determined, based on outcomes.      Primary Diagnoses: Present on Admission:  Septic shock (HCC)   I have reviewed the medical record, interviewed the patient and family, and examined the patient. The following aspects are pertinent.  Past Medical History:  Diagnosis Date   Anxiety    Arthritis    COPD (chronic obstructive pulmonary disease) (HCC)    Coronary artery disease    a. STEMI with cardiac arrest (polymorphic VT) s/p DES to RCA.   GERD (gastroesophageal reflux disease)    Headache(784.0)    Hearing loss, central    Left ear   Hematoma    Hypertension    Memory changes    Mild aortic stenosis    Mild carotid artery disease (HCC)    a. 1-39% by duplex 02/2018.   Seizures (HCC) 10/2012   No seizures, speech issues; after a fall, hitting head on left side   UTI (urinary tract infection)    June 2022   Social History   Socioeconomic History   Marital status: Married    Spouse name: Not on file   Number of children: Not on file   Years of education: Not on file   Highest education level: Not on file  Occupational History   Not on file  Tobacco Use   Smoking status: Never   Smokeless tobacco: Never  Vaping Use   Vaping Use: Never used  Substance and Sexual Activity   Alcohol use: No   Drug use: No   Sexual activity: Not on file  Other Topics Concern   Not on file  Social History Narrative   Not on file   Social Determinants of Health   Financial Resource Strain: Not on file  Food Insecurity: No Food Insecurity (11/18/2021)   Hunger Vital Sign    Worried About Running Out of Food in the Last Year: Never true    Ran Out of Food in the Last Year: Never true  Transportation Needs:  No Transportation Needs (11/18/2021)   PRAPARE - Administrator, Civil Service (Medical): No    Lack of Transportation (Non-Medical): No  Physical Activity: Not on file  Stress: Not on file  Social Connections: Not on file   Family History  Problem Relation Age of Onset   Dementia Mother    Cancer Father    Dementia Maternal Aunt    Ataxia Neg Hx    Chorea Neg Hx    Mental retardation Neg Hx    Migraines Neg Hx    Multiple sclerosis Neg Hx    Neurofibromatosis Neg Hx    Neuropathy Neg Hx    Parkinsonism Neg Hx  Seizures Neg Hx    Stroke Neg Hx    Scheduled Meds:  Chlorhexidine Gluconate Cloth  6 each Topical Q0600   heparin  5,000 Units Subcutaneous Q8H   latanoprost  1 drop Both Eyes QHS   mouth rinse  15 mL Mouth Rinse 4 times per day   Continuous Infusions:  [START ON 11/19/2021] azithromycin (ZITHROMAX) 500 mg in sodium chloride 0.9 % 250 mL IVPB     cefTRIAXone (ROCEPHIN)  IV Stopped (11/18/21 8242)   lactated ringers 150 mL/hr at 11/18/21 1443   norepinephrine (LEVOPHED) Adult infusion 10 mcg/min (11/18/21 1443)   vasopressin Stopped (11/18/21 0938)   PRN Meds:.acetaminophen **OR** acetaminophen, ondansetron **OR** ondansetron (ZOFRAN) IV, mouth rinse Medications Prior to Admission:  Prior to Admission medications   Medication Sig Start Date End Date Taking? Authorizing Provider  acetaminophen (TYLENOL) 500 MG tablet Take 1 tablet (500 mg total) by mouth as needed (no more than 3 gm / day). 12/04/19  Yes Cleaver, Thomasene Ripple, NP  ALPRAZolam Prudy Feeler) 0.5 MG tablet Take 0.5 mg by mouth 3 (three) times daily as needed for anxiety.   Yes [provider]  amLODipine (NORVASC) 5 MG tablet Take 1 tablet (5 mg total) by mouth daily. 05/29/19 11/18/21 Yes Jonelle Sidle, MD  aspirin 81 MG chewable tablet Chew 1 tablet (81 mg total) by mouth daily. 01/13/18  Yes Georgie Chard D, NP  atorvastatin (LIPITOR) 80 MG tablet Take 1 tablet (80 mg total) by mouth  daily at 6 PM. 01/12/18  Yes Georgie Chard D, NP  carvedilol (COREG) 6.25 MG tablet TAKE ONE TABLET (6.25MG  TOTAL) BY MOUTH TWO TIMES DAILY 04/10/20  Yes Jonelle Sidle, MD  celecoxib (CELEBREX) 200 MG capsule Take 200 mg by mouth daily.   Yes [provider]  cholecalciferol (VITAMIN D) 1000 UNITS tablet Take 1,000 Units by mouth daily.    Yes [provider]  HYDROcodone-acetaminophen (NORCO) 10-325 MG tablet Take 1 tablet by mouth every 6 (six) hours as needed.   Yes [provider]  latanoprost (XALATAN) 0.005 % ophthalmic solution INSTILL ONE DROP IN BOTH EYES NIGHTLY 05/06/20  Yes Rankin, Alford Highland, MD  levETIRAcetam (KEPPRA) 500 MG tablet Take 500 mg by mouth 2 (two) times daily.   Yes [provider]  memantine (NAMENDA) 5 MG tablet Take 5 mg by mouth 2 (two) times daily. 03/01/21  Yes [provider]  methocarbamol (ROBAXIN) 500 MG tablet Take 1 tablet by mouth in the morning and at bedtime.  02/22/19  Yes [provider]  nitroGLYCERIN (NITROSTAT) 0.4 MG SL tablet Place 1 tablet (0.4 mg total) under the tongue every 5 (five) minutes x 3 doses as needed for chest pain. 01/12/18  Yes Georgie Chard D, NP  pantoprazole (PROTONIX) 40 MG tablet Take 40 mg by mouth daily.   Yes [provider]  Propylene Glycol (SYSTANE BALANCE OP) Place 1-2 drops into both eyes daily as needed (dry eyes).   Yes [provider]  risperiDONE (RISPERDAL) 0.5 MG tablet Take 0.5 mg by mouth 2 (two) times daily. 04/23/20  Yes [provider]  cefdinir (OMNICEF) 300 MG capsule Take 1 capsule (300 mg total) by mouth every 12 (twelve) hours. Patient not taking: Reported on 11/18/2021 05/09/20   Catarina Hartshorn, MD  clotrimazole (MYCELEX) 10 MG troche Take 1 tablet (10 mg total) by mouth 5 (five) times daily. Patient not taking: Reported on 11/18/2021 05/09/20   Catarina Hartshorn, MD  dicyclomine (BENTYL) 20 MG  tablet Take 20 mg by mouth in the morning and at  bedtime. Patient not taking: Reported on 11/18/2021 03/01/21   [provider]  doxycycline (VIBRA-TABS) 100 MG tablet Take 1 tablet (100 mg total) by mouth every 12 (twelve) hours. Patient not taking: Reported on 11/18/2021 05/09/20   Catarina Hartshornat, David, MD  predniSONE (DELTASONE) 50 MG tablet Take 1 tablet (50 mg total) by mouth daily with breakfast. Patient not taking: Reported on 11/18/2021 05/09/20   Catarina Hartshornat, David, MD  lisinopril (ZESTRIL) 20 MG tablet Take 1 tablet (20 mg total) by mouth 2 (two) times daily. 01/24/19 05/03/19  Ellsworth LennoxStrader, Brittany M, PA-C   Allergies  Allergen Reactions   Lisinopril Swelling   Eggs Or Egg-Derived Products Itching    Patient reports that she can eat eggs but does not tolerate egg derived products   Morphine And Related     Headache   Penicillins Itching    DID THE REACTION INVOLVE: Swelling of the face/tongue/throat, SOB, or low BP? No Sudden or severe rash/hives, skin peeling, or the inside of the mouth or nose? No Did it require medical treatment? No When did it last happen? Unknown If all above answers are "NO", may proceed with cephalosporin use.    Review of Systems  Unable to perform ROS: Acuity of condition    Physical Exam Vitals and nursing note reviewed.  Cardiovascular:     Rate and Rhythm: Normal rate.  Pulmonary:     Effort: Pulmonary effort is normal. No respiratory distress.  Neurological:     Comments: Lethargic, does not respond to orientation questions at this time.     Vital Signs: BP 134/60   Pulse 88   Temp (!) 97.3 F (36.3 C) (Axillary) Comment: notified RN  Resp 12   Ht 5' (1.524 m)   Wt 70.2 kg   SpO2 94%   BMI 30.23 kg/m  Pain Scale: CPOT       SpO2: SpO2: 94 % O2 Device:SpO2: 94 % O2 Flow Rate: .O2 Flow Rate (L/min): 2 L/min  IO: Intake/output summary:  Intake/Output Summary (Last 24 hours) at 11/18/2021 1448 Last data filed at 11/18/2021 1443 Gross per 24 hour  Intake 5451.48 ml  Output 100 ml  Net  5351.48 ml    LBM:   Baseline Weight: Weight: 61.9 kg Most recent weight: Weight: 70.2 kg     Palliative Assessment/Data:   Flowsheet Rows    Flowsheet Row Most Recent Value  Intake Tab   Referral Department Hospitalist  Unit at Time of Referral ICU  Palliative Care Primary Diagnosis Sepsis/Infectious Disease  Date Notified 11/18/21  Palliative Care Type Return patient Palliative Care  Reason for referral Clarify Goals of Care  Date of Admission 11/18/21  Date first seen by Palliative Care 11/18/21  # of days Palliative referral response time 0 Day(s)  # of days IP prior to Palliative referral 0  Clinical Assessment   Palliative Performance Scale Score 20%  Pain Max last 24 hours Not able to report  Pain Min Last 24 hours Not able to report  Dyspnea Max Last 24 Hours Not able to report  Dyspnea Min Last 24 hours Not able to report  Psychosocial & Spiritual Assessment   Palliative Care Outcomes        Time In: 0830 Time Out: 0915 Time Total: 75 minutes  Greater than 50%  of this time was spent counseling and coordinating care related to the above assessment and plan.  Signed by: Kamille Toomey A  Hulan Fray, NP   Please contact Palliative Medicine Team phone at [phone] for questions and concerns.  For individual provider: See Shea Evans

## 2021-11-18 NOTE — ED Provider Notes (Addendum)
[facility] EMERGENCY DEPARTMENT Provider Note   CSN: [id number] Arrival date & time: 11/18/21  7425     History  Chief Complaint  Patient presents with   Hypotension   Altered Mental Status    Beverly [name] is a 88 y.o. female.  Patient is altered and unable to give much history.  When asked she states she feels good and goes back to sleep.  Her daughter states that she has been in her normal state of health until Monday she started being a little bit weak and Tuesday got so weak that she was unable to help transfer and move around at her baseline.  Some intermittent confusion she went to see the primary doctor who started her on antibiotics which she started Wednesday.  Wednesday evening daughter found her significantly altered and significantly weak and rapid breathing.  Her home blood pressure machine read 70/50 and then 112/80 if she is not sure which order.  With that and her altered mental status and rapid breathing she called EMS.  With EMS her blood pressures were in the 80s systolic.  This [facility] fluids and brought her here for further evaluation. Daughter confirms that patient is DNR.    Altered Mental Status      Home Medications Prior to Admission medications   Medication Sig Start Date End Date Taking? Authorizing Provider  acetaminophen (TYLENOL) 500 MG tablet Take 1 tablet (500 mg total) by mouth as needed (no more than 3 gm / day). 12/04/19   Ronney Asters, NP  ALPRAZolam Prudy Feeler) 0.5 MG tablet Take 0.5 mg by mouth 3 (three) times daily as needed for anxiety.    [provider]  amLODipine (NORVASC) 5 MG tablet Take 1 tablet (5 mg total) by mouth daily. 05/29/19 12/04/19  Jonelle Sidle, MD  aspirin 81 MG chewable tablet Chew 1 tablet (81 mg total) by mouth daily. 01/13/18   Filbert Schilder, NP  atorvastatin (LIPITOR) 80 MG tablet Take 1 tablet (80 mg total) by mouth daily at 6 PM. 01/12/18   Georgie Chard D, NP  carvedilol (COREG) 6.25 MG tablet TAKE  ONE TABLET (6.25MG  TOTAL) BY MOUTH TWO TIMES DAILY 04/10/20   Jonelle Sidle, MD  cefdinir (OMNICEF) 300 MG capsule Take 1 capsule (300 mg total) by mouth every 12 (twelve) hours. 05/09/20   Catarina Hartshorn, MD  celecoxib (CELEBREX) 200 MG capsule Take 200 mg by mouth daily.    [provider]  cholecalciferol (VITAMIN D) 1000 UNITS tablet Take 1,000 Units by mouth daily.     [provider]  clotrimazole (MYCELEX) 10 MG troche Take 1 tablet (10 mg total) by mouth 5 (five) times daily. 05/09/20   Catarina Hartshorn, MD  dicyclomine (BENTYL) 20 MG tablet Take 20 mg by mouth in the morning and at bedtime. 03/01/21   [provider]  doxycycline (VIBRA-TABS) 100 MG tablet Take 1 tablet (100 mg total) by mouth every 12 (twelve) hours. 05/09/20   Catarina Hartshorn, MD  fluticasone (FLONASE) 50 MCG/ACT nasal spray Place 1 spray into both nostrils daily.  09/18/12   [provider]  HYDROcodone-acetaminophen (NORCO) 10-325 MG tablet Take 1 tablet by mouth every 6 (six) hours as needed.    [provider]  latanoprost (XALATAN) 0.005 % ophthalmic solution INSTILL ONE DROP IN BOTH EYES NIGHTLY 05/06/20   Rankin, Alford Highland, MD  levETIRAcetam (KEPPRA) 500 MG tablet Take 500 mg by mouth 2 (two) times daily.    [provider]  memantine (NAMENDA) 5 MG tablet Take 5 mg by mouth 2 (two) times daily. 03/01/21   [provider]  methocarbamol (ROBAXIN) 500 MG tablet Take 1 tablet by mouth in the morning and at bedtime.  02/22/19   [provider]  nitroGLYCERIN (NITROSTAT) 0.4 MG SL tablet Place 1 tablet (0.4 mg total) under the tongue every 5 (five) minutes x 3 doses as needed for chest pain. 01/12/18   Georgie Chard D, NP  pantoprazole (PROTONIX) 40 MG tablet Take 40 mg by mouth daily.    [provider]  potassium chloride (K-DUR) 10 MEQ tablet Take 10 mEq by mouth daily.     [provider]  predniSONE (DELTASONE) 50 MG tablet Take 1 tablet (50 mg total)  by mouth daily with breakfast. Patient taking differently: Take 50 mg by mouth as needed. 05/09/20   Catarina Hartshorn, MD  Propylene Glycol (SYSTANE BALANCE OP) Place 1-2 drops into both eyes daily as needed (dry eyes).    [provider]  risperiDONE (RISPERDAL) 0.5 MG tablet Take 0.5 mg by mouth 2 (two) times daily. 04/23/20   [provider]  lisinopril (ZESTRIL) 20 MG tablet Take 1 tablet (20 mg total) by mouth 2 (two) times daily. 01/24/19 05/03/19  Strader, Lennart Pall, PA-C      Allergies    Lisinopril, Eggs or egg-derived products, Morphine and related, and Penicillins    Review of Systems   Review of Systems  Physical Exam Updated Vital Signs BP (!) 99/56   Pulse 78   Temp 97.9 F (36.6 C) (Oral)   Resp 13   Ht 5' (1.524 m)   Wt 61.9 kg   SpO2 97%   BMI 26.65 kg/m  Physical Exam Vitals and nursing note reviewed.  Constitutional:      Appearance: She is well-developed.  HENT:     Head: Normocephalic and atraumatic.  Eyes:     Pupils: Pupils are equal, round, and reactive to light.  Cardiovascular:     Rate and Rhythm: Normal rate and regular rhythm.  Pulmonary:     Effort: No respiratory distress.     Breath sounds: No stridor.  Abdominal:     General: There is no distension.     Tenderness: There is no abdominal tenderness.  Musculoskeletal:        General: Normal range of motion.     Cervical back: Normal range of motion.  Skin:    General: Skin is warm and dry.     Coloration: Skin is pale.  Neurological:     General: No focal deficit present.     Mental Status: She is alert. She is disoriented.     Motor: Weakness (generalized) present.     ED Results / Procedures / Treatments   Labs (all labs ordered are listed, but only abnormal results are displayed) Labs Reviewed  LACTIC ACID, PLASMA - Abnormal; Notable for the following components:      Result Value   Lactic Acid, Venous 3.2 (*)    All other components within normal limits   COMPREHENSIVE METABOLIC PANEL - Abnormal; Notable for the following components:   Sodium 133 (*)    Chloride 97 (*)    Glucose, Bld 161 (*)    BUN 50 (*)    Creatinine, Ser 1.84 (*)    Albumin 2.9 (*)    GFR, Estimated 26 (*)    All other components within normal limits  CBC WITH DIFFERENTIAL/PLATELET - Abnormal; Notable  for the following components:   WBC 22.0 (*)    Hemoglobin 11.4 (*)    RDW 17.9 (*)    Neutro Abs 16.6 (*)    Monocytes Absolute 2.9 (*)    Abs Immature Granulocytes 0.33 (*)    All other components within normal limits  URINALYSIS, ROUTINE W REFLEX MICROSCOPIC - Abnormal; Notable for the following components:   Color, Urine AMBER (*)    APPearance CLOUDY (*)    Protein, ur 100 (*)    Bacteria, UA MANY (*)    Non Squamous Epithelial 0-5 (*)    All other components within normal limits  PROTIME-INR - Abnormal; Notable for the following components:   Prothrombin Time 15.6 (*)    INR 1.3 (*)    All other components within normal limits  APTT - Abnormal; Notable for the following components:   aPTT 46 (*)    All other components within normal limits  RESP PANEL BY RT-PCR (FLU A&B, COVID) ARPGX2  CULTURE, BLOOD (ROUTINE X 2)  CULTURE, BLOOD (ROUTINE X 2)  URINE CULTURE  LACTIC ACID, PLASMA    EKG None  Radiology US EKG SITE RITE  Result Date: 11/18/2021 If Site Rite image not attached, placement could not be confirmed due to current cardiac rhythm.  DG Chest Port 1 View  Result Date: 11/18/2021 CLINICAL DATA:  88 year old female with possible sepsis. EXAM: PORTABLE CHEST 1 VIEW COMPARISON:  Chest x-ray 05/01/2020. FINDINGS: Today's study is limited by considerable patient rotation to the right and slumped kyphotic positioning, which grossly distorts normal anatomical structures. With these limitations in mind, lung volumes are low. Opacity at the left base which may reflect atelectasis and/or consolidation, with superimposed small left pleural effusion.  Within the limitations of today's examination, the right lung appears clear. No definite right pleural effusion. No pneumothorax. No evidence of pulmonary edema. Heart size is mildly enlarged. Mediastinal contours are grossly distorted. IMPRESSION: 1. Limited examination demonstrating low lung volumes with probable left left basilar subsegmental atelectasis and small left pleural effusion. Electronically Signed   By: Trudie Reedaniel  Entrikin M.D.   On: 11/18/2021 05:41    Procedures .Critical Care  Performed by: Marily MemosMesner, Conswella Bruney, MD Authorized by: Marily MemosMesner, Saiya Crist, MD   Critical care provider statement:    Critical care time (minutes):  75   Critical care was necessary to treat or prevent imminent or life-threatening deterioration of the following conditions:  Respiratory failure, dehydration, sepsis, shock and metabolic crisis   Critical care was time spent personally by me on the following activities:  Development of treatment plan with patient or surrogate, discussions with consultants, evaluation of patient's response to treatment, examination of patient, ordering and review of laboratory studies, ordering and review of radiographic studies, ordering and performing treatments and interventions, pulse oximetry, re-evaluation of patient's condition and review of old charts .Central Line  Date/Time: 11/18/2021 8:49 AM  Performed by: Marily MemosMesner, Gordie Belvin, MD Authorized by: Marily MemosMesner, Yulonda Wheeling, MD   Consent:    Consent obtained:  Verbal   Risks discussed:  Incorrect placement, infection, bleeding, arterial puncture, nerve damage and pneumothorax   Alternatives discussed:  No treatment, delayed treatment, alternative treatment and observation Universal protocol:    Procedure explained and questions answered to patient or proxy's satisfaction: yes     Test results available: yes     Imaging studies available: yes     Site/side marked: yes   Pre-procedure details:    Indication(s): central venous access     Hand  hygiene:  Hand hygiene performed prior to insertion     Sterile barrier technique: All elements of maximal sterile technique followed     Skin preparation:  2% chlorhexidine   Skin preparation agent: Skin preparation agent completely dried prior to procedure   Anesthesia:    Anesthesia method:  Local infiltration   Local anesthetic:  Lidocaine 1% WITH epi Procedure details:    Location:  L internal jugular   Procedural supplies:  Triple lumen   Catheter size:  7 Fr   Ultrasound guidance: yes     Sterile ultrasound techniques: Sterile gel and sterile probe covers were used     Number of attempts:  2   Successful placement: yes   Post-procedure details:    Post-procedure:  Dressing applied and line sutured   Assessment:  Blood return through all ports, free fluid flow and no pneumothorax on x-ray   Procedure completion:  Tolerated well, no immediate complications     Medications Ordered in ED Medications  lactated ringers infusion (has no administration in time range)  cefTRIAXone (ROCEPHIN) 2 g in sodium chloride 0.9 % 100 mL IVPB (0 g Intravenous Stopped 11/18/21 0649)  norepinephrine (LEVOPHED) 4mg  in (0.016 mg/mL) premix infusion (12 mcg/min Intravenous Infusion Verify 11/18/21 0728)  azithromycin (ZITHROMAX) 500 mg in sodium chloride 0.9 % 250 mL IVPB (500 mg Intravenous New Bag/Given 11/18/21 11/20/21)  lactated ringers bolus 1,000 mL (0 mLs Intravenous Stopped 11/18/21 0620)    And  lactated ringers bolus 1,000 mL (0 mLs Intravenous Stopped 11/18/21 0643)  lactated ringers bolus 1,000 mL (1,000 mLs Intravenous New Bag/Given 11/18/21 11/20/21)    ED Course/ Medical Decision Making/ A&P                           Medical Decision Making Amount and/or Complexity of Data Reviewed Labs: ordered. Radiology: ordered.  Risk Prescription drug management. Decision regarding hospitalization.   88 yo F here with shock and hypoxia which seems to be related to a UTI. LA 3.2,  persistent hypotension after 2.5L of fluids, levo started. Abx started after cultures for UTI/possible pneumonia. AKI on renal function. Leukocytosis. BP improving with levophed and so is mental status. Daughter confirms DNR but would want aggressive care to a point. PICC line order placed but hoping we can get her off levophed with some more fluids.   Pressures improving with fluids. Pending PICC line placement however IM asked for CVC. Done per note. Inpatient team to take over care.    Final Clinical Impression(s) / ED Diagnoses Final diagnoses:  Hypotension, unspecified hypotension type  Sepsis, due to unspecified organism, unspecified whether acute organ dysfunction present [facility])  Urinary tract infection without hematuria, site unspecified    Rx / DC Orders ED Discharge Orders     None         Ancel Easler, [facility], MD 11/18/21 0745    Milton Sagona, 11/20/21, MD 11/18/21 [phone]

## 2021-11-18 NOTE — Sepsis Progress Note (Signed)
Elink monitoring for the code sepsis protocol.  

## 2021-11-19 DIAGNOSIS — A419 Sepsis, unspecified organism: Secondary | ICD-10-CM | POA: Diagnosis not present

## 2021-11-19 DIAGNOSIS — R6521 Severe sepsis with septic shock: Secondary | ICD-10-CM | POA: Diagnosis not present

## 2021-11-19 LAB — MAGNESIUM: Magnesium: 1.5 mg/dL — ABNORMAL LOW (ref 1.7–2.4)

## 2021-11-19 LAB — CBC
HCT: 33.6 % — ABNORMAL LOW (ref 36.0–46.0)
Hemoglobin: 11 g/dL — ABNORMAL LOW (ref 12.0–15.0)
MCH: 30.1 pg (ref 26.0–34.0)
MCHC: 32.7 g/dL (ref 30.0–36.0)
MCV: 91.8 fL (ref 80.0–100.0)
Platelets: 283 10*3/uL (ref 150–400)
RBC: 3.66 MIL/uL — ABNORMAL LOW (ref 3.87–5.11)
RDW: 17.7 % — ABNORMAL HIGH (ref 11.5–15.5)
WBC: 16.3 10*3/uL — ABNORMAL HIGH (ref 4.0–10.5)
nRBC: 0 % (ref 0.0–0.2)

## 2021-11-19 LAB — URINE CULTURE: Culture: NO GROWTH

## 2021-11-19 LAB — BASIC METABOLIC PANEL
Anion gap: 9 (ref 5–15)
BUN: 37 mg/dL — ABNORMAL HIGH (ref 8–23)
CO2: 24 mmol/L (ref 22–32)
Calcium: 8.6 mg/dL — ABNORMAL LOW (ref 8.9–10.3)
Chloride: 103 mmol/L (ref 98–111)
Creatinine, Ser: 0.99 mg/dL (ref 0.44–1.00)
GFR, Estimated: 56 mL/min — ABNORMAL LOW (ref >60)
Glucose, Bld: 115 mg/dL — ABNORMAL HIGH (ref 70–99)
Potassium: 4.2 mmol/L (ref 3.5–5.1)
Sodium: 136 mmol/L (ref 135–145)

## 2021-11-19 LAB — LACTIC ACID, PLASMA: Lactic Acid, Venous: 0.8 mmol/L (ref 0.5–1.9)

## 2021-11-19 LAB — GLUCOSE, CAPILLARY
Glucose-Capillary: 103 mg/dL — ABNORMAL HIGH (ref 70–99)
Glucose-Capillary: 118 mg/dL — ABNORMAL HIGH (ref 70–99)

## 2021-11-19 MED ORDER — MAGNESIUM SULFATE 4 GM/100ML IV SOLN
4.0000 g | Freq: Once | INTRAVENOUS | Status: AC
  Administered 2021-11-19: 4 g via INTRAVENOUS
  Filled 2021-11-19: qty 100

## 2021-11-19 MED ORDER — ASPIRIN 81 MG PO CHEW
81.0000 mg | CHEWABLE_TABLET | Freq: Every day | ORAL | Status: DC
  Administered 2021-11-19 – 2021-11-23 (×5): 81 mg via ORAL
  Filled 2021-11-19 (×5): qty 1

## 2021-11-19 MED ORDER — LEVETIRACETAM 500 MG PO TABS
500.0000 mg | ORAL_TABLET | Freq: Two times a day (BID) | ORAL | Status: DC
  Administered 2021-11-19 – 2021-11-23 (×9): 500 mg via ORAL
  Filled 2021-11-19 (×9): qty 1

## 2021-11-19 MED ORDER — RISPERIDONE 0.5 MG PO TABS
0.5000 mg | ORAL_TABLET | Freq: Two times a day (BID) | ORAL | Status: DC
  Administered 2021-11-19 – 2021-11-23 (×9): 0.5 mg via ORAL
  Filled 2021-11-19 (×9): qty 1

## 2021-11-19 MED ORDER — MEMANTINE HCL 10 MG PO TABS
5.0000 mg | ORAL_TABLET | Freq: Two times a day (BID) | ORAL | Status: DC
  Administered 2021-11-19 – 2021-11-23 (×9): 5 mg via ORAL
  Filled 2021-11-19 (×9): qty 1

## 2021-11-19 MED ORDER — ORAL CARE MOUTH RINSE
15.0000 mL | Freq: Two times a day (BID) | OROMUCOSAL | Status: DC
  Administered 2021-11-19 – 2021-11-23 (×8): 15 mL via OROMUCOSAL

## 2021-11-19 MED ORDER — ALPRAZOLAM 0.5 MG PO TABS
0.5000 mg | ORAL_TABLET | Freq: Three times a day (TID) | ORAL | Status: DC | PRN
  Administered 2021-11-19 – 2021-11-23 (×8): 0.5 mg via ORAL
  Filled 2021-11-19 (×8): qty 1

## 2021-11-19 MED ORDER — PANTOPRAZOLE SODIUM 40 MG PO TBEC
40.0000 mg | DELAYED_RELEASE_TABLET | Freq: Every day | ORAL | Status: DC
  Administered 2021-11-19 – 2021-11-23 (×5): 40 mg via ORAL
  Filled 2021-11-19 (×5): qty 1

## 2021-11-19 MED ORDER — ATORVASTATIN CALCIUM 40 MG PO TABS
80.0000 mg | ORAL_TABLET | Freq: Every day | ORAL | Status: DC
  Administered 2021-11-19 – 2021-11-22 (×4): 80 mg via ORAL
  Filled 2021-11-19 (×4): qty 2

## 2021-11-19 NOTE — Progress Notes (Signed)
Attempted to wean patient off her oxygen, in doing so she dropped to 84%. 2L nasal cannula re-applied. Will continue to try and wean as tolerated. Pt also had home oral meds re-ordered. Meds crushed and given in applesauce just for patient safety since she isn't fully alert and oriented quite yet. Will continue to monitor.

## 2021-11-19 NOTE — Plan of Care (Signed)

## 2021-11-19 NOTE — Care Management Important Message (Signed)
Important Message  Patient Details  Name: Beverly [name] MRN: [id number] Date of Birth: [birth year]   Medicare Important Message Given:  Yes     Corey Harold 11/19/2021, 3:36 PM

## 2021-11-19 NOTE — Progress Notes (Signed)
PROGRESS NOTE    Beverly [name]  ZOX:[id number] DOB: 1932/07/10 DOA: 11/18/2021 PCP: Benita Stabile, MD   Brief Narrative:    Beverly [name] is a 88 y.o. female wit ha significant history of HTN, seizure post fall with TBI, MI, HLD, and mild dementia presenting today with AMS with hypotension in the setting of UTI. Surgical history includes bilateral knee replacements and cranial surgery for hematoma post fall.  Patient presented to the ED with altered mentation as well as weakness and some hypotension.  She was admitted with septic shock secondary to presumed pneumonia as well as UTI and required norepinephrine and vasopressin initially on admission.  Assessment & Plan:   Principal Problem:   Septic shock (HCC)  Assessment and Plan:  Septic Shock secondary to UTI and/or Pneumonia-resolved -Continue IV Rocephin and IV Azithromycin -No further need for pressors and okay for telemetry floor -Hold further IV fluid -Monitor WBC with CBC daily --Daughter counseled and is understanding --Continue to provide family support --Palliative care consulted    Acute Metabolic Encephalopathy secondary to sepsis-resolving -Daughter is at bedside and is decision maker -Monitor electrolytes with BMP daily -Monitor WBC with CBC daily -Continue to monitor and okay for transfer to telemetry, okay to start diet   AKI secondary to sepsis-improving -Avoid nephrotoxic agents where possible. -Monitor Cr and BUN with BMP daily. -Creatine baseline 0.4 as of 05/09/20   Hypoalbuminemia -2.9 on presentation 11/16 -Monitor with BMP daily   HTN -Hold home BP medication due to current hypotension -May plan to resume by a.m. if blood pressures are elevated   Seizure Hx -post fall with TBI -Daughter states no seizure since the one after her fall -Continue to monitor   History of MI -Okay to resume aspirin   HLD -Hold atorvastatin while NPO   Mild dementia -Hold Namenda and other dementia agents  while NPO   GERD -Hold Protonix while NPO  Obesity -BMI 30.44  Hypomagnesemia -Replete and reevaluate in a.m.    DVT prophylaxis: Heparin Code Status: DNR Family Communication: Daughter at bedside 11/16 Disposition Plan: Continue ongoing treatment with IV antibiotics Status is: Inpatient Remains inpatient appropriate because: IV medications.  Consultants:  Palliative  Procedures:  None  Antimicrobials:  Anti-infectives (From admission, onward)    Start     Dose/Rate Route Frequency Ordered Stop   11/19/21 0800  azithromycin (ZITHROMAX) 500 mg in sodium chloride 0.9 % 250 mL IVPB        500 mg 250 mL/hr over 60 Minutes Intravenous Every 24 hours 11/18/21 1054     11/18/21 0615  azithromycin (ZITHROMAX) 500 mg in sodium chloride 0.9 % 250 mL IVPB        500 mg 250 mL/hr over 60 Minutes Intravenous  Once 11/18/21 0614 11/18/21 0753   11/18/21 0530  cefTRIAXone (ROCEPHIN) 2 g in sodium chloride 0.9 % 100 mL IVPB        2 g 200 mL/hr over 30 Minutes Intravenous Every 24 hours 11/18/21 0516 11/25/21 0529      Subjective: Patient seen and evaluated today with no new acute complaints or concerns. No acute concerns or events noted overnight.  She is somewhat somnolent, but is minimally responsive.  Pressors were discontinued at 3:15 AM.  Objective: Vitals:   11/19/21 0730 11/19/21 0800 11/19/21 0830 11/19/21 0900  BP: 120/61 (!) 124/55 (!) 115/51 (!) 111/49  Pulse: 85 90 80 80  Resp: (!) 26 20 (!) 22 17  Temp:  99.1 F (37.3  C)    TempSrc:  Axillary    SpO2: 96% 96% 95% 96%  Weight:      Height:        Intake/Output Summary (Last 24 hours) at 11/19/2021 0920 Last data filed at 11/19/2021 0543 Gross per 24 hour  Intake 5764.53 ml  Output 1400 ml  Net 4364.53 ml   Filed Weights   11/18/21 0442 11/18/21 0945 11/19/21 0421  Weight: 61.9 kg 70.2 kg 70.7 kg    Examination:  General exam: Appears calm and comfortable  Respiratory system: Clear to auscultation.  Respiratory effort normal.  Nasal cannula oxygen Cardiovascular system: S1 & S2 heard, RRR.  Gastrointestinal system: Abdomen is soft Central nervous system: Somnolent but arousable Extremities: No edema Skin: No significant lesions noted Psychiatry: Flat affect.    Data Reviewed: I have personally reviewed following labs and imaging studies  CBC: Recent Labs  Lab 11/18/21 0516 11/19/21 0216  WBC 22.0* 16.3*  NEUTROABS 16.6*  --   HGB 11.4* 11.0*  HCT 36.0 33.6*  MCV 93.0 91.8  PLT 259 283   Basic Metabolic Panel: Recent Labs  Lab 11/18/21 0516 11/19/21 0216  NA 133* 136  K 4.9 4.2  CL 97* 103  CO2 24 24  GLUCOSE 161* 115*  BUN 50* 37*  CREATININE 1.84* 0.99  CALCIUM 9.2 8.6*  MG  --  1.5*   GFR: Estimated Creatinine Clearance: 35.8 mL/min (by C-G formula based on SCr of 0.99 mg/dL). Liver Function Tests: Recent Labs  Lab 11/18/21 0516  AST 22  ALT 13  ALKPHOS 93  BILITOT 0.8  PROT 6.5  ALBUMIN 2.9*   No results for input(s): "LIPASE", "AMYLASE" in the last 168 hours. No results for input(s): "AMMONIA" in the last 168 hours. Coagulation Profile: Recent Labs  Lab 11/18/21 0551  INR 1.3*   Cardiac Enzymes: No results for input(s): "CKTOTAL", "CKMB", "CKMBINDEX", "TROPONINI" in the last 168 hours. BNP (last 3 results) No results for input(s): "PROBNP" in the last 8760 hours. HbA1C: No results for input(s): "HGBA1C" in the last 72 hours. CBG: Recent Labs  Lab 11/18/21 0944 11/18/21 1634 11/18/21 2055 11/19/21 0013  GLUCAP 159* 122* 117* 118*   Lipid Profile: No results for input(s): "CHOL", "HDL", "LDLCALC", "TRIG", "CHOLHDL", "LDLDIRECT" in the last 72 hours. Thyroid Function Tests: No results for input(s): "TSH", "T4TOTAL", "FREET4", "T3FREE", "THYROIDAB" in the last 72 hours. Anemia Panel: No results for input(s): "VITAMINB12", "FOLATE", "FERRITIN", "TIBC", "IRON", "RETICCTPCT" in the last 72 hours. Sepsis Labs: Recent Labs  Lab  11/18/21 0551 11/18/21 0751 11/19/21 0217  LATICACIDVEN 3.2* 2.2* 0.8    Recent Results (from the past 240 hour(s))  Resp Panel by RT-PCR (Flu A&B, Covid) Anterior Nasal Swab     Status: None   Collection Time: 11/18/21  5:16 AM   Specimen: Anterior Nasal Swab  Result Value Ref Range Status   SARS Coronavirus 2 by RT PCR NEGATIVE NEGATIVE Final    Comment: (NOTE) SARS-CoV-2 target nucleic acids are NOT DETECTED.  The SARS-CoV-2 RNA is generally detectable in upper respiratory specimens during the acute phase of infection. The lowest concentration of SARS-CoV-2 viral copies this assay can detect is 138 copies/mL. A negative result does not preclude SARS-Cov-2 infection and should not be used as the sole basis for treatment or other patient management decisions. A negative result may occur with  improper specimen collection/handling, submission of specimen other than nasopharyngeal swab, presence of viral mutation(s) within the areas targeted by this assay, and  inadequate number of viral copies(<138 copies/mL). A negative result must be combined with clinical observations, patient history, and epidemiological information. The expected result is Negative.  Fact Sheet for Patients:  BloggerCourse.comhttps://www.fda.gov/media/152166/download  Fact Sheet for Healthcare Providers:  SeriousBroker.ithttps://www.fda.gov/media/152162/download  This test is no t yet approved or cleared by the [place] FDA and  has been authorized for detection and/or diagnosis of SARS-CoV-2 by FDA under an Emergency Use Authorization (EUA). This EUA will remain  in effect (meaning this test can be used) for the duration of the COVID-19 declaration under Section 564(b)(1) of the Act, 21 U.S.C.section 360bbb-3(b)(1), unless the authorization is terminated  or revoked sooner.       Influenza A by PCR NEGATIVE NEGATIVE Final   Influenza B by PCR NEGATIVE NEGATIVE Final    Comment: (NOTE) The Xpert Xpress SARS-CoV-2/FLU/RSV plus  assay is intended as an aid in the diagnosis of influenza from Nasopharyngeal swab specimens and should not be used as a sole basis for treatment. Nasal washings and aspirates are unacceptable for Xpert Xpress SARS-CoV-2/FLU/RSV testing.  Fact Sheet for Patients: BloggerCourse.comhttps://www.fda.gov/media/152166/download  Fact Sheet for Healthcare Providers: SeriousBroker.ithttps://www.fda.gov/media/152162/download  This test is not yet approved or cleared by the [place] FDA and has been authorized for detection and/or diagnosis of SARS-CoV-2 by FDA under an Emergency Use Authorization (EUA). This EUA will remain in effect (meaning this test can be used) for the duration of the COVID-19 declaration under Section 564(b)(1) of the Act, 21 U.S.C. section 360bbb-3(b)(1), unless the authorization is terminated or revoked.  Performed at [facility], [address] [place], [place] [address]   Urine Culture     Status: None   Collection Time: 11/18/21  5:16 AM   Specimen: In/Out Cath Urine  Result Value Ref Range Status   Specimen Description   Final    IN/OUT CATH URINE Performed at [facility], [address] [place], [place] [address]    Special Requests   Final    NONE Performed at [facility], [address] [place], [place] [address]    Culture   Final    NO GROWTH Performed at [facility], [address] [place], [place] [address]    Report Status 11/19/2021 FINAL  Final  Blood Culture (routine x 2)     Status: None (Preliminary result)   Collection Time: 11/18/21  5:51 AM   Specimen: BLOOD  Result Value Ref Range Status   Specimen Description BLOOD BLOOD RIGHT HAND  Final   Special Requests   Final    BOTTLES DRAWN AEROBIC AND ANAEROBIC Blood Culture adequate volume BLOOD RIGHT HAND   Culture   Final    NO GROWTH 1 DAY Performed at [facility], [address] [place], [place] [address]    Report Status PENDING  Incomplete  Blood Culture (routine x 2)     Status: None (Preliminary  result)   Collection Time: 11/18/21  5:51 AM   Specimen: BLOOD  Result Value Ref Range Status   Specimen Description BLOOD BLOOD LEFT HAND  Final   Special Requests   Final    BOTTLES DRAWN AEROBIC AND ANAEROBIC Blood Culture adequate volume BLOOD LEFT HAND   Culture   Final    NO GROWTH 1 DAY Performed at [facility], [address] [place], [place] [address]    Report Status PENDING  Incomplete  MRSA Next Gen by PCR, Nasal     Status: None   Collection Time: 11/18/21  9:29 AM  Specimen: Nasal Mucosa; Nasal Swab  Result Value Ref Range Status   MRSA by PCR Next Gen NOT DETECTED NOT DETECTED Final    Comment: (NOTE) The GeneXpert MRSA Assay (FDA approved for NASAL specimens only), is one component of a comprehensive MRSA colonization surveillance program. It is not intended to diagnose MRSA infection nor to guide or monitor treatment for MRSA infections. Test performance is not FDA approved in patients less than 20 years old. Performed at [facility], [address], [place], [place] [address]          Radiology Studies: DG Chest Portable 1 View  Result Date: 11/18/2021 CLINICAL DATA:  Status post central line placement EXAM: PORTABLE CHEST 1 VIEW COMPARISON:  11/18/2021 FINDINGS: Diffuse bilateral interstitial thickening. Small right pleural effusion. Bibasilar airspace disease likely reflecting atelectasis. Stable cardiomediastinal silhouette. No pneumothorax. Left sided central venous catheter with the tip projecting over the SVC. IMPRESSION: 1. Left sided central venous catheter with the tip projecting over the SVC. No pneumothorax. 2. Bilateral small pleural effusions with bibasilar atelectasis. Electronically Signed   By: Elige Ko M.D.   On: 11/18/2021 09:09   [place] EKG SITE RITE  Result Date: 11/18/2021 If Site Rite image not attached, placement could not be confirmed due to current cardiac rhythm.  DG Chest Port 1 View  Result Date: 11/18/2021 CLINICAL DATA:   88 year old female with possible sepsis. EXAM: PORTABLE CHEST 1 VIEW COMPARISON:  Chest x-ray 05/01/2020. FINDINGS: Today's study is limited by considerable patient rotation to the right and slumped kyphotic positioning, which grossly distorts normal anatomical structures. With these limitations in mind, lung volumes are low. Opacity at the left base which may reflect atelectasis and/or consolidation, with superimposed small left pleural effusion. Within the limitations of today's examination, the right lung appears clear. No definite right pleural effusion. No pneumothorax. No evidence of pulmonary edema. Heart size is mildly enlarged. Mediastinal contours are grossly distorted. IMPRESSION: 1. Limited examination demonstrating low lung volumes with probable left left basilar subsegmental atelectasis and small left pleural effusion. Electronically Signed   By: Trudie Reed M.D.   On: 11/18/2021 05:41        Scheduled Meds:  aspirin  81 mg Oral Daily   atorvastatin  80 mg Oral q1800   Chlorhexidine Gluconate Cloth  6 each Topical Q0600   heparin  5,000 Units Subcutaneous Q8H   latanoprost  1 drop Both Eyes QHS   levETIRAcetam  500 mg Oral BID   memantine  5 mg Oral BID   mouth rinse  15 mL Mouth Rinse 4 times per day   pantoprazole  40 mg Oral Daily   risperiDONE  0.5 mg Oral BID   Continuous Infusions:  azithromycin (ZITHROMAX) 500 mg in sodium chloride 0.9 % 250 mL IVPB 500 mg (11/19/21 0726)   cefTRIAXone (ROCEPHIN)  IV 2 g (11/19/21 0436)     LOS: 1 day    Time spent: 35 minutes    Maleak Brazzel Hoover Brunette, DO [facility]  If 7PM-7AM, please contact night-coverage [email or web address] 11/19/2021, 9:20 AM

## 2021-11-19 NOTE — Progress Notes (Signed)
  Transition of Care [facility]) Screening Note   Patient Details  Name: Beverly [name] Date of Birth: [birth year]   Transition of Care [facility]) CM/SW Contact:    Annice Needy, LCSW Phone Number: 11/19/2021, 9:33 AM    Transition of Care Department [facility]) has reviewed patient and no TOC needs have been identified at this time. We will continue to monitor patient advancement through interdisciplinary progression rounds. If new patient transition needs arise, please place a TOC consult.

## 2021-11-20 DIAGNOSIS — A419 Sepsis, unspecified organism: Secondary | ICD-10-CM | POA: Diagnosis not present

## 2021-11-20 DIAGNOSIS — R6521 Severe sepsis with septic shock: Secondary | ICD-10-CM | POA: Diagnosis not present

## 2021-11-20 LAB — CBC
HCT: 32.5 % — ABNORMAL LOW (ref 36.0–46.0)
Hemoglobin: 10.7 g/dL — ABNORMAL LOW (ref 12.0–15.0)
MCH: 29.8 pg (ref 26.0–34.0)
MCHC: 32.9 g/dL (ref 30.0–36.0)
MCV: 90.5 fL (ref 80.0–100.0)
Platelets: 245 10*3/uL (ref 150–400)
RBC: 3.59 MIL/uL — ABNORMAL LOW (ref 3.87–5.11)
RDW: 17.8 % — ABNORMAL HIGH (ref 11.5–15.5)
WBC: 11.7 10*3/uL — ABNORMAL HIGH (ref 4.0–10.5)
nRBC: 0 % (ref 0.0–0.2)

## 2021-11-20 LAB — BASIC METABOLIC PANEL
Anion gap: 8 (ref 5–15)
BUN: 21 mg/dL (ref 8–23)
CO2: 30 mmol/L (ref 22–32)
Calcium: 8.5 mg/dL — ABNORMAL LOW (ref 8.9–10.3)
Chloride: 99 mmol/L (ref 98–111)
Creatinine, Ser: 0.57 mg/dL (ref 0.44–1.00)
GFR, Estimated: >60 mL/min (ref >60)
Glucose, Bld: 101 mg/dL — ABNORMAL HIGH (ref 70–99)
Potassium: 3.1 mmol/L — ABNORMAL LOW (ref 3.5–5.1)
Sodium: 137 mmol/L (ref 135–145)

## 2021-11-20 LAB — MAGNESIUM: Magnesium: 1.8 mg/dL (ref 1.7–2.4)

## 2021-11-20 MED ORDER — CARVEDILOL 3.125 MG PO TABS
6.2500 mg | ORAL_TABLET | Freq: Two times a day (BID) | ORAL | Status: DC
  Administered 2021-11-20 – 2021-11-23 (×6): 6.25 mg via ORAL
  Filled 2021-11-20 (×6): qty 2

## 2021-11-20 MED ORDER — POTASSIUM CHLORIDE 10 MEQ/100ML IV SOLN
10.0000 meq | INTRAVENOUS | Status: AC
  Administered 2021-11-20 (×4): 10 meq via INTRAVENOUS
  Filled 2021-11-20 (×4): qty 100

## 2021-11-20 MED ORDER — HYDROCODONE-ACETAMINOPHEN 5-325 MG PO TABS
1.0000 | ORAL_TABLET | Freq: Once | ORAL | Status: AC | PRN
  Administered 2021-11-20: 1 via ORAL
  Filled 2021-11-20: qty 1

## 2021-11-20 MED ORDER — HYDRALAZINE HCL 20 MG/ML IJ SOLN
10.0000 mg | Freq: Four times a day (QID) | INTRAMUSCULAR | Status: DC | PRN
  Administered 2021-11-20 – 2021-11-21 (×4): 10 mg via INTRAVENOUS
  Filled 2021-11-20 (×4): qty 1

## 2021-11-20 NOTE — Plan of Care (Signed)

## 2021-11-20 NOTE — Progress Notes (Signed)
PROGRESS NOTE    Beverly [name]  HEN:[id number] DOB: April 09, 1932 DOA: 11/18/2021 PCP: Benita Stabile, MD   Brief Narrative:    Beverly [name] is a 88 y.o. female wit ha significant history of HTN, seizure post fall with TBI, MI, HLD, and mild dementia presenting today with AMS with hypotension in the setting of UTI. Surgical history includes bilateral knee replacements and cranial surgery for hematoma post fall.  Patient presented to the ED with altered mentation as well as weakness and some hypotension.  She was admitted with septic shock secondary to presumed pneumonia as well as UTI and required norepinephrine and vasopressin initially on admission.   Assessment & Plan:   Principal Problem:   Septic shock (HCC)  Assessment and Plan:   Septic Shock secondary to UTI and/or Pneumonia-resolved -Continue IV Rocephin and IV Azithromycin -No further need for pressors and okay for telemetry floor -Hold further IV fluid -Monitor WBC with CBC daily --Daughter counseled and is understanding --Continue to provide family support --Palliative care consulted    Acute Metabolic Encephalopathy secondary to sepsis-resolving -Daughter is at bedside and is decision maker -Monitor electrolytes with BMP daily -Monitor WBC with CBC daily -Continue to monitor and okay for transfer to telemetry, okay to start diet   AKI secondary to sepsis-improving -Avoid nephrotoxic agents where possible. -Monitor Cr and BUN with BMP daily. -Creatine baseline 0.4 as of 05/09/20   Hypoalbuminemia -2.9 on presentation 11/16 -Monitor with BMP daily   HTN -Hold home BP medication due to current hypotension -May plan to resume by a.m. if blood pressures are elevated   Seizure Hx -post fall with TBI -Daughter states no seizure since the one after her fall -Continue to monitor   History of MI -Okay to resume aspirin   HLD -Resume atorvastatin   Mild dementia -Resume Namenda   GERD -Resume Protonix    Obesity -BMI 30.44   Hypokalemia -Replete and reevaluate in a.m.     DVT prophylaxis: Heparin Code Status: DNR Family Communication: Daughter on phone 11/18 Disposition Plan: Continue ongoing treatment with IV antibiotics Status is: Inpatient Remains inpatient appropriate because: IV medications.   Consultants:  Palliative   Procedures:  None   Antimicrobials:  Anti-infectives (From admission, onward)    Start     Dose/Rate Route Frequency Ordered Stop   11/19/21 0800  azithromycin (ZITHROMAX) 500 mg in sodium chloride 0.9 % 250 mL IVPB        500 mg 250 mL/hr over 60 Minutes Intravenous Every 24 hours 11/18/21 1054     11/18/21 0615  azithromycin (ZITHROMAX) 500 mg in sodium chloride 0.9 % 250 mL IVPB        500 mg 250 mL/hr over 60 Minutes Intravenous  Once 11/18/21 0614 11/18/21 0753   11/18/21 0530  cefTRIAXone (ROCEPHIN) 2 g in sodium chloride 0.9 % 100 mL IVPB        2 g 200 mL/hr over 30 Minutes Intravenous Every 24 hours 11/18/21 0516 11/25/21 0529       Subjective: Patient seen and evaluated today with no new acute complaints or concerns. No acute concerns or events noted overnight.  Objective: Vitals:   11/20/21 0500 11/20/21 0600 11/20/21 0700 11/20/21 0800  BP: (!) 146/64 (!) 152/71 (!) 132/57 (!) 146/57  Pulse: (!) 101 94 82 82  Resp: (!) 28 (!) 24 (!) 24 (!) 26  Temp:   98.9 F (37.2 C)   TempSrc:   Axillary   SpO2: 93% 95%  96% 95%  Weight: 71.6 kg     Height:        Intake/Output Summary (Last 24 hours) at 11/20/2021 1243 Last data filed at 11/20/2021 0950 Gross per 24 hour  Intake 490 ml  Output 2650 ml  Net -2160 ml   Filed Weights   11/18/21 0945 11/19/21 0421 11/20/21 0500  Weight: 70.2 kg 70.7 kg 71.6 kg    Examination:  General exam: Appears calm and comfortable  Respiratory system: Clear to auscultation. Respiratory effort normal. Cardiovascular system: S1 & S2 heard, RRR.  Gastrointestinal system: Abdomen is soft Central  nervous system: Alert and awake Extremities: No edema Skin: No significant lesions noted Psychiatry: Flat affect.    Data Reviewed: I have personally reviewed following labs and imaging studies  CBC: Recent Labs  Lab 11/18/21 0516 11/19/21 0216 11/20/21 0336  WBC 22.0* 16.3* 11.7*  NEUTROABS 16.6*  --   --   HGB 11.4* 11.0* 10.7*  HCT 36.0 33.6* 32.5*  MCV 93.0 91.8 90.5  PLT 259 283 245   Basic Metabolic Panel: Recent Labs  Lab 11/18/21 0516 11/19/21 0216 11/20/21 0336  NA 133* 136 137  K 4.9 4.2 3.1*  CL 97* 103 99  CO2 24 24 30   GLUCOSE 161* 115* 101*  BUN 50* 37* 21  CREATININE 1.84* 0.99 0.57  CALCIUM 9.2 8.6* 8.5*  MG  --  1.5* 1.8   GFR: Estimated Creatinine Clearance: 44.5 mL/min (by C-G formula based on SCr of 0.57 mg/dL). Liver Function Tests: Recent Labs  Lab 11/18/21 0516  AST 22  ALT 13  ALKPHOS 93  BILITOT 0.8  PROT 6.5  ALBUMIN 2.9*   No results for input(s): "LIPASE", "AMYLASE" in the last 168 hours. No results for input(s): "AMMONIA" in the last 168 hours. Coagulation Profile: Recent Labs  Lab 11/18/21 0551  INR 1.3*   Cardiac Enzymes: No results for input(s): "CKTOTAL", "CKMB", "CKMBINDEX", "TROPONINI" in the last 168 hours. BNP (last 3 results) No results for input(s): "PROBNP" in the last 8760 hours. HbA1C: No results for input(s): "HGBA1C" in the last 72 hours. CBG: Recent Labs  Lab 11/18/21 0944 11/18/21 1634 11/18/21 2055 11/19/21 0013 11/19/21 1534  GLUCAP 159* 122* 117* 118* 103*   Lipid Profile: No results for input(s): "CHOL", "HDL", "LDLCALC", "TRIG", "CHOLHDL", "LDLDIRECT" in the last 72 hours. Thyroid Function Tests: No results for input(s): "TSH", "T4TOTAL", "FREET4", "T3FREE", "THYROIDAB" in the last 72 hours. Anemia Panel: No results for input(s): "VITAMINB12", "FOLATE", "FERRITIN", "TIBC", "IRON", "RETICCTPCT" in the last 72 hours. Sepsis Labs: Recent Labs  Lab 11/18/21 0551 11/18/21 0751  11/19/21 0217  LATICACIDVEN 3.2* 2.2* 0.8    Recent Results (from the past 240 hour(s))  Resp Panel by RT-PCR (Flu A&B, Covid) Anterior Nasal Swab     Status: None   Collection Time: 11/18/21  5:16 AM   Specimen: Anterior Nasal Swab  Result Value Ref Range Status   SARS Coronavirus 2 by RT PCR NEGATIVE NEGATIVE Final    Comment: (NOTE) SARS-CoV-2 target nucleic acids are NOT DETECTED.  The SARS-CoV-2 RNA is generally detectable in upper respiratory specimens during the acute phase of infection. The lowest concentration of SARS-CoV-2 viral copies this assay can detect is 138 copies/mL. A negative result does not preclude SARS-Cov-2 infection and should not be used as the sole basis for treatment or other patient management decisions. A negative result may occur with  improper specimen collection/handling, submission of specimen other than nasopharyngeal swab, presence of viral mutation(s)  within the areas targeted by this assay, and inadequate number of viral copies(<138 copies/mL). A negative result must be combined with clinical observations, patient history, and epidemiological information. The expected result is Negative.  Fact Sheet for Patients:  BloggerCourse.comhttps://www.fda.gov/media/152166/download  Fact Sheet for Healthcare Providers:  SeriousBroker.ithttps://www.fda.gov/media/152162/download  This test is no t yet approved or cleared by the [place] FDA and  has been authorized for detection and/or diagnosis of SARS-CoV-2 by FDA under an Emergency Use Authorization (EUA). This EUA will remain  in effect (meaning this test can be used) for the duration of the COVID-19 declaration under Section 564(b)(1) of the Act, 21 U.S.C.section 360bbb-3(b)(1), unless the authorization is terminated  or revoked sooner.       Influenza A by PCR NEGATIVE NEGATIVE Final   Influenza B by PCR NEGATIVE NEGATIVE Final    Comment: (NOTE) The Xpert Xpress SARS-CoV-2/FLU/RSV plus assay is intended as an  aid in the diagnosis of influenza from Nasopharyngeal swab specimens and should not be used as a sole basis for treatment. Nasal washings and aspirates are unacceptable for Xpert Xpress SARS-CoV-2/FLU/RSV testing.  Fact Sheet for Patients: BloggerCourse.comhttps://www.fda.gov/media/152166/download  Fact Sheet for Healthcare Providers: SeriousBroker.ithttps://www.fda.gov/media/152162/download  This test is not yet approved or cleared by the [place] FDA and has been authorized for detection and/or diagnosis of SARS-CoV-2 by FDA under an Emergency Use Authorization (EUA). This EUA will remain in effect (meaning this test can be used) for the duration of the COVID-19 declaration under Section 564(b)(1) of the Act, 21 U.S.C. section 360bbb-3(b)(1), unless the authorization is terminated or revoked.  Performed at [facility], [address] [place], [place] [address]   Urine Culture     Status: None   Collection Time: 11/18/21  5:16 AM   Specimen: In/Out Cath Urine  Result Value Ref Range Status   Specimen Description   Final    IN/OUT CATH URINE Performed at [facility], [address] [place], [place] [address]    Special Requests   Final    NONE Performed at [facility], [address] [place], [place] [address]    Culture   Final    NO GROWTH Performed at [facility], [address] [place], [place] [address]    Report Status 11/19/2021 FINAL  Final  Blood Culture (routine x 2)     Status: None (Preliminary result)   Collection Time: 11/18/21  5:51 AM   Specimen: BLOOD  Result Value Ref Range Status   Specimen Description BLOOD BLOOD RIGHT HAND  Final   Special Requests   Final    BOTTLES DRAWN AEROBIC AND ANAEROBIC Blood Culture adequate volume BLOOD RIGHT HAND   Culture   Final    NO GROWTH 2 DAYS Performed at [facility], [address] [place], [place] [address]    Report Status PENDING  Incomplete  Blood Culture (routine x 2)     Status: None (Preliminary result)   Collection  Time: 11/18/21  5:51 AM   Specimen: BLOOD  Result Value Ref Range Status   Specimen Description BLOOD BLOOD LEFT HAND  Final   Special Requests   Final    BOTTLES DRAWN AEROBIC AND ANAEROBIC Blood Culture adequate volume BLOOD LEFT HAND   Culture   Final    NO GROWTH 2 DAYS Performed at [facility], [address] [place], [place] [address]    Report Status PENDING  Incomplete  MRSA Next Gen by PCR, Nasal     Status: None  Collection Time: 11/18/21  9:29 AM   Specimen: Nasal Mucosa; Nasal Swab  Result Value Ref Range Status   MRSA by PCR Next Gen NOT DETECTED NOT DETECTED Final    Comment: (NOTE) The GeneXpert MRSA Assay (FDA approved for NASAL specimens only), is one component of a comprehensive MRSA colonization surveillance program. It is not intended to diagnose MRSA infection nor to guide or monitor treatment for MRSA infections. Test performance is not FDA approved in patients less than 4 years old. Performed at [facility], [address] [place], [place] [address]          Radiology Studies: No results found.      Scheduled Meds:  aspirin  81 mg Oral Daily   atorvastatin  80 mg Oral q1800   Chlorhexidine Gluconate Cloth  6 each Topical Q0600   heparin  5,000 Units Subcutaneous Q8H   latanoprost  1 drop Both Eyes QHS   levETIRAcetam  500 mg Oral BID   memantine  5 mg Oral BID   mouth rinse  15 mL Mouth Rinse BID   pantoprazole  40 mg Oral Daily   risperiDONE  0.5 mg Oral BID   Continuous Infusions:  azithromycin (ZITHROMAX) 500 mg in sodium chloride 0.9 % 250 mL IVPB 500 mg (11/20/21 0837)   cefTRIAXone (ROCEPHIN)  IV 2 g (11/20/21 0512)     LOS: 2 days    Time spent: 35 minutes    Slayde Brault Hoover Brunette, DO [facility]  If 7PM-7AM, please contact night-coverage [email or web address] 11/20/2021, 12:43 PM

## 2021-11-21 DIAGNOSIS — A419 Sepsis, unspecified organism: Secondary | ICD-10-CM | POA: Diagnosis not present

## 2021-11-21 DIAGNOSIS — R6521 Severe sepsis with septic shock: Secondary | ICD-10-CM | POA: Diagnosis not present

## 2021-11-21 LAB — CBC
HCT: 38.7 % (ref 36.0–46.0)
Hemoglobin: 13.1 g/dL (ref 12.0–15.0)
MCH: 30 pg (ref 26.0–34.0)
MCHC: 33.9 g/dL (ref 30.0–36.0)
MCV: 88.6 fL (ref 80.0–100.0)
Platelets: 302 10*3/uL (ref 150–400)
RBC: 4.37 MIL/uL (ref 3.87–5.11)
RDW: 17.6 % — ABNORMAL HIGH (ref 11.5–15.5)
WBC: 13.9 10*3/uL — ABNORMAL HIGH (ref 4.0–10.5)
nRBC: 0 % (ref 0.0–0.2)

## 2021-11-21 LAB — BASIC METABOLIC PANEL
Anion gap: 10 (ref 5–15)
BUN: 12 mg/dL (ref 8–23)
CO2: 31 mmol/L (ref 22–32)
Calcium: 8.5 mg/dL — ABNORMAL LOW (ref 8.9–10.3)
Chloride: 94 mmol/L — ABNORMAL LOW (ref 98–111)
Creatinine, Ser: 0.47 mg/dL (ref 0.44–1.00)
GFR, Estimated: >60 mL/min (ref >60)
Glucose, Bld: 133 mg/dL — ABNORMAL HIGH (ref 70–99)
Potassium: 2.8 mmol/L — ABNORMAL LOW (ref 3.5–5.1)
Sodium: 135 mmol/L (ref 135–145)

## 2021-11-21 LAB — MAGNESIUM: Magnesium: 1.4 mg/dL — ABNORMAL LOW (ref 1.7–2.4)

## 2021-11-21 LAB — GLUCOSE, CAPILLARY: Glucose-Capillary: 131 mg/dL — ABNORMAL HIGH (ref 70–99)

## 2021-11-21 MED ORDER — MAGNESIUM SULFATE 2 GM/50ML IV SOLN
2.0000 g | Freq: Once | INTRAVENOUS | Status: AC
  Administered 2021-11-21: 2 g via INTRAVENOUS
  Filled 2021-11-21: qty 50

## 2021-11-21 MED ORDER — POTASSIUM CHLORIDE 10 MEQ/100ML IV SOLN
10.0000 meq | INTRAVENOUS | Status: AC
  Administered 2021-11-21 (×4): 10 meq via INTRAVENOUS
  Filled 2021-11-21 (×4): qty 100

## 2021-11-21 MED ORDER — POTASSIUM CHLORIDE CRYS ER 20 MEQ PO TBCR
40.0000 meq | EXTENDED_RELEASE_TABLET | Freq: Once | ORAL | Status: AC
  Administered 2021-11-21: 40 meq via ORAL
  Filled 2021-11-21: qty 2

## 2021-11-21 NOTE — Progress Notes (Signed)
PROGRESS NOTE    Beverly AmericanJean C Lemma  WGN:[id number] DOB: 04/18/1932 DOA: 11/18/2021 PCP: Benita StabileHall, [name], MD   Brief Narrative:    Beverly [name] is a 88 y.o. female wit ha significant history of HTN, seizure post fall with TBI, MI, HLD, and mild dementia presenting today with AMS with hypotension in the setting of UTI. Surgical history includes bilateral knee replacements and cranial surgery for hematoma post fall.  Patient presented to the ED with altered mentation as well as weakness and some hypotension.  She was admitted with septic shock secondary to presumed pneumonia as well as UTI and required norepinephrine and vasopressin initially on admission.    Assessment & Plan:   Principal Problem:   Septic shock (HCC)  Assessment and Plan:   Septic Shock secondary to UTI and/or Pneumonia-resolved -Continue IV Rocephin and IV Azithromycin -No further need for pressors and okay for telemetry floor -Hold further IV fluid -Monitor WBC with CBC daily --Daughter counseled and is understanding --Continue to provide family support --Palliative care consulted    Acute Metabolic Encephalopathy secondary to sepsis-resolving -Daughter is at bedside and is decision maker -Monitor electrolytes with BMP daily -Monitor WBC with CBC daily -Continue to monitor and okay for transfer to telemetry, okay to start diet   AKI secondary to sepsis-improving -Avoid nephrotoxic agents where possible. -Monitor Cr and BUN with BMP daily. -Creatine baseline 0.4 as of 05/09/20   Hypoalbuminemia -2.9 on presentation 11/16 -Monitor with BMP daily   HTN -Hold home BP medication due to current hypotension -May plan to resume by a.m. if blood pressures are elevated   Seizure Hx -post fall with TBI -Daughter states no seizure since the one after her fall -Continue to monitor   History of MI -Okay to resume aspirin   HLD -Resume atorvastatin   Mild dementia -Resume Namenda   GERD -Resume Protonix    Obesity -BMI 30.44   Hypokalemia/hypomagnesemia -Replete and reevaluate in a.m.     DVT prophylaxis: Heparin Code Status: DNR Family Communication: Daughter on phone 11/19 Disposition Plan: Continue ongoing treatment with IV antibiotics Status is: Inpatient Remains inpatient appropriate because: IV medications.   Consultants:  Palliative   Procedures:  None   Antimicrobials:  Anti-infectives (From admission, onward)    Start     Dose/Rate Route Frequency Ordered Stop   11/19/21 0800  azithromycin (ZITHROMAX) 500 mg in sodium chloride 0.9 % 250 mL IVPB        500 mg 250 mL/hr over 60 Minutes Intravenous Every 24 hours 11/18/21 1054     11/18/21 0615  azithromycin (ZITHROMAX) 500 mg in sodium chloride 0.9 % 250 mL IVPB        500 mg 250 mL/hr over 60 Minutes Intravenous  Once 11/18/21 0614 11/18/21 0753   11/18/21 0530  cefTRIAXone (ROCEPHIN) 2 g in sodium chloride 0.9 % 100 mL IVPB        2 g 200 mL/hr over 30 Minutes Intravenous Every 24 hours 11/18/21 0516 11/25/21 0529      Subjective: Patient seen and evaluated today with no new acute complaints or concerns. No acute concerns or events noted overnight.  Objective: Vitals:   11/21/21 0600 11/21/21 0759 11/21/21 1148 11/21/21 1200  BP: (!) 118/48   (!) 154/59  Pulse: 82 77 80 73  Resp: (!) 29 (!) 25 15 (!) 27  Temp:  99 F (37.2 C) 98.4 F (36.9 C)   TempSrc:  Axillary Oral   SpO2: 97% 97% 97%  97%  Weight:      Height:        Intake/Output Summary (Last 24 hours) at 11/21/2021 1314 Last data filed at 11/21/2021 0349 Gross per 24 hour  Intake 480 ml  Output 2450 ml  Net -1970 ml   Filed Weights   11/19/21 0421 11/20/21 0500 11/21/21 0350  Weight: 70.7 kg 71.6 kg 67.9 kg    Examination:  General exam: Appears calm and comfortable  Respiratory system: Clear to auscultation. Respiratory effort normal. [place] Cardiovascular system: S1 & S2 heard, RRR.  Gastrointestinal system: Abdomen is soft Central  nervous system: somnolent Extremities: No edema Skin: No significant lesions noted Psychiatry: Flat affect.    Data Reviewed: I have personally reviewed following labs and imaging studies  CBC: Recent Labs  Lab 11/18/21 0516 11/19/21 0216 11/20/21 0336 11/21/21 0437  WBC 22.0* 16.3* 11.7* 13.9*  NEUTROABS 16.6*  --   --   --   HGB 11.4* 11.0* 10.7* 13.1  HCT 36.0 33.6* 32.5* 38.7  MCV 93.0 91.8 90.5 88.6  PLT 259 283 245 302   Basic Metabolic Panel: Recent Labs  Lab 11/18/21 0516 11/19/21 0216 11/20/21 0336 11/21/21 0437  NA 133* 136 137 135  K 4.9 4.2 3.1* 2.8*  CL 97* 103 99 94*  CO2 24 24 30 31   GLUCOSE 161* 115* 101* 133*  BUN 50* 37* 21 12  CREATININE 1.84* 0.99 0.57 0.47  CALCIUM 9.2 8.6* 8.5* 8.5*  MG  --  1.5* 1.8 1.4*   GFR: Estimated Creatinine Clearance: 43.4 mL/min (by C-G formula based on SCr of 0.47 mg/dL). Liver Function Tests: Recent Labs  Lab 11/18/21 0516  AST 22  ALT 13  ALKPHOS 93  BILITOT 0.8  PROT 6.5  ALBUMIN 2.9*   No results for input(s): "LIPASE", "AMYLASE" in the last 168 hours. No results for input(s): "AMMONIA" in the last 168 hours. Coagulation Profile: Recent Labs  Lab 11/18/21 0551  INR 1.3*   Cardiac Enzymes: No results for input(s): "CKTOTAL", "CKMB", "CKMBINDEX", "TROPONINI" in the last 168 hours. BNP (last 3 results) No results for input(s): "PROBNP" in the last 8760 hours. HbA1C: No results for input(s): "HGBA1C" in the last 72 hours. CBG: Recent Labs  Lab 11/18/21 0944 11/18/21 1634 11/18/21 2055 11/19/21 0013 11/19/21 1534  GLUCAP 159* 122* 117* 118* 103*   Lipid Profile: No results for input(s): "CHOL", "HDL", "LDLCALC", "TRIG", "CHOLHDL", "LDLDIRECT" in the last 72 hours. Thyroid Function Tests: No results for input(s): "TSH", "T4TOTAL", "FREET4", "T3FREE", "THYROIDAB" in the last 72 hours. Anemia Panel: No results for input(s): "VITAMINB12", "FOLATE", "FERRITIN", "TIBC", "IRON", "RETICCTPCT" in  the last 72 hours. Sepsis Labs: Recent Labs  Lab 11/18/21 0551 11/18/21 0751 11/19/21 0217  LATICACIDVEN 3.2* 2.2* 0.8    Recent Results (from the past 240 hour(s))  Resp Panel by RT-PCR (Flu A&B, Covid) Anterior Nasal Swab     Status: None   Collection Time: 11/18/21  5:16 AM   Specimen: Anterior Nasal Swab  Result Value Ref Range Status   SARS Coronavirus 2 by RT PCR NEGATIVE NEGATIVE Final    Comment: (NOTE) SARS-CoV-2 target nucleic acids are NOT DETECTED.  The SARS-CoV-2 RNA is generally detectable in upper respiratory specimens during the acute phase of infection. The lowest concentration of SARS-CoV-2 viral copies this assay can detect is 138 copies/mL. A negative result does not preclude SARS-Cov-2 infection and should not be used as the sole basis for treatment or other patient management decisions. A negative result  may occur with  improper specimen collection/handling, submission of specimen other than nasopharyngeal swab, presence of viral mutation(s) within the areas targeted by this assay, and inadequate number of viral copies(<138 copies/mL). A negative result must be combined with clinical observations, patient history, and epidemiological information. The expected result is Negative.  Fact Sheet for Patients:  BloggerCourse.com  Fact Sheet for Healthcare Providers:  SeriousBroker.it  This test is no t yet approved or cleared by the [place] FDA and  has been authorized for detection and/or diagnosis of SARS-CoV-2 by FDA under an Emergency Use Authorization (EUA). This EUA will remain  in effect (meaning this test can be used) for the duration of the COVID-19 declaration under Section 564(b)(1) of the Act, 21 U.S.C.section 360bbb-3(b)(1), unless the authorization is terminated  or revoked sooner.       Influenza A by PCR NEGATIVE NEGATIVE Final   Influenza B by PCR NEGATIVE NEGATIVE Final     Comment: (NOTE) The Xpert Xpress SARS-CoV-2/FLU/RSV plus assay is intended as an aid in the diagnosis of influenza from Nasopharyngeal swab specimens and should not be used as a sole basis for treatment. Nasal washings and aspirates are unacceptable for Xpert Xpress SARS-CoV-2/FLU/RSV testing.  Fact Sheet for Patients: BloggerCourse.com  Fact Sheet for Healthcare Providers: SeriousBroker.it  This test is not yet approved or cleared by the [place] FDA and has been authorized for detection and/or diagnosis of SARS-CoV-2 by FDA under an Emergency Use Authorization (EUA). This EUA will remain in effect (meaning this test can be used) for the duration of the COVID-19 declaration under Section 564(b)(1) of the Act, 21 U.S.C. section 360bbb-3(b)(1), unless the authorization is terminated or revoked.  Performed at [facility], [address], [place], [place] [address]   Urine Culture     Status: None   Collection Time: 11/18/21  5:16 AM   Specimen: In/Out Cath Urine  Result Value Ref Range Status   Specimen Description   Final    IN/OUT CATH URINE Performed at [facility], [address], [place], [place] [address]    Special Requests   Final    NONE Performed at [facility], [address], [place], [place] [address]    Culture   Final    NO GROWTH Performed at [facility], [address], [place], [place] [address]    Report Status 11/19/2021 FINAL  Final  Blood Culture (routine x 2)     Status: None (Preliminary result)   Collection Time: 11/18/21  5:51 AM   Specimen: BLOOD  Result Value Ref Range Status   Specimen Description BLOOD BLOOD RIGHT HAND  Final   Special Requests   Final    BOTTLES DRAWN AEROBIC AND ANAEROBIC Blood Culture adequate volume BLOOD RIGHT HAND   Culture   Final    NO GROWTH 3 DAYS Performed at [facility], [address], [place], [place] [address]    Report Status PENDING  Incomplete   Blood Culture (routine x 2)     Status: None (Preliminary result)   Collection Time: 11/18/21  5:51 AM   Specimen: BLOOD  Result Value Ref Range Status   Specimen Description BLOOD BLOOD LEFT HAND  Final   Special Requests   Final    BOTTLES DRAWN AEROBIC AND ANAEROBIC Blood Culture adequate volume BLOOD LEFT HAND   Culture   Final    NO GROWTH 3 DAYS Performed at [facility], [address] [place], [place] [address]  Report Status PENDING  Incomplete  MRSA Next Gen by PCR, Nasal     Status: None   Collection Time: 11/18/21  9:29 AM   Specimen: Nasal Mucosa; Nasal Swab  Result Value Ref Range Status   MRSA by PCR Next Gen NOT DETECTED NOT DETECTED Final    Comment: (NOTE) The GeneXpert MRSA Assay (FDA approved for NASAL specimens only), is one component of a comprehensive MRSA colonization surveillance program. It is not intended to diagnose MRSA infection nor to guide or monitor treatment for MRSA infections. Test performance is not FDA approved in patients less than 41 years old. Performed at [facility], [address], [place], [place] [address]          Radiology Studies: No results found.      Scheduled Meds:  aspirin  81 mg Oral Daily   atorvastatin  80 mg Oral q1800   carvedilol  6.25 mg Oral BID WC   Chlorhexidine Gluconate Cloth  6 each Topical Q0600   heparin  5,000 Units Subcutaneous Q8H   latanoprost  1 drop Both Eyes QHS   levETIRAcetam  500 mg Oral BID   memantine  5 mg Oral BID   mouth rinse  15 mL Mouth Rinse BID   pantoprazole  40 mg Oral Daily   risperiDONE  0.5 mg Oral BID   Continuous Infusions:  azithromycin (ZITHROMAX) 500 mg in sodium chloride 0.9 % 250 mL IVPB 500 mg (11/21/21 0955)   cefTRIAXone (ROCEPHIN)  IV 2 g (11/21/21 0504)   potassium chloride 10 mEq (11/21/21 1225)     LOS: 3 days    Time spent: 35 minutes    Afton Mikelson Hoover Brunette, DO [facility]  If 7PM-7AM, please contact  night-coverage [email or web address] 11/21/2021, 1:14 PM

## 2021-11-21 NOTE — Progress Notes (Signed)
Verbal report given to nurse receiving patient in unit 300. Patient is currently awake and alert in bed resting quietly with call bell in reach.

## 2021-11-22 DIAGNOSIS — N390 Urinary tract infection, site not specified: Secondary | ICD-10-CM | POA: Diagnosis not present

## 2021-11-22 DIAGNOSIS — A419 Sepsis, unspecified organism: Secondary | ICD-10-CM | POA: Diagnosis not present

## 2021-11-22 DIAGNOSIS — R6521 Severe sepsis with septic shock: Secondary | ICD-10-CM | POA: Diagnosis not present

## 2021-11-22 DIAGNOSIS — Z515 Encounter for palliative care: Secondary | ICD-10-CM | POA: Diagnosis not present

## 2021-11-22 DIAGNOSIS — Z7189 Other specified counseling: Secondary | ICD-10-CM | POA: Diagnosis not present

## 2021-11-22 LAB — GLUCOSE, CAPILLARY: Glucose-Capillary: 158 mg/dL — ABNORMAL HIGH (ref 70–99)

## 2021-11-22 LAB — CBC
HCT: 34.8 % — ABNORMAL LOW (ref 36.0–46.0)
Hemoglobin: 11.6 g/dL — ABNORMAL LOW (ref 12.0–15.0)
MCH: 29.9 pg (ref 26.0–34.0)
MCHC: 33.3 g/dL (ref 30.0–36.0)
MCV: 89.7 fL (ref 80.0–100.0)
Platelets: 264 10*3/uL (ref 150–400)
RBC: 3.88 MIL/uL (ref 3.87–5.11)
RDW: 17.3 % — ABNORMAL HIGH (ref 11.5–15.5)
WBC: 11.2 10*3/uL — ABNORMAL HIGH (ref 4.0–10.5)
nRBC: 0 % (ref 0.0–0.2)

## 2021-11-22 LAB — BASIC METABOLIC PANEL
Anion gap: 7 (ref 5–15)
BUN: 10 mg/dL (ref 8–23)
CO2: 31 mmol/L (ref 22–32)
Calcium: 8.1 mg/dL — ABNORMAL LOW (ref 8.9–10.3)
Chloride: 96 mmol/L — ABNORMAL LOW (ref 98–111)
Creatinine, Ser: 0.47 mg/dL (ref 0.44–1.00)
GFR, Estimated: >60 mL/min (ref >60)
Glucose, Bld: 99 mg/dL (ref 70–99)
Potassium: 3.5 mmol/L (ref 3.5–5.1)
Sodium: 134 mmol/L — ABNORMAL LOW (ref 135–145)

## 2021-11-22 LAB — MAGNESIUM: Magnesium: 1.8 mg/dL (ref 1.7–2.4)

## 2021-11-22 MED ORDER — POTASSIUM CHLORIDE CRYS ER 20 MEQ PO TBCR
40.0000 meq | EXTENDED_RELEASE_TABLET | Freq: Once | ORAL | Status: AC
  Administered 2021-11-22: 40 meq via ORAL
  Filled 2021-11-22: qty 2

## 2021-11-22 NOTE — Progress Notes (Signed)
Pt positioned in trendelenburg, 3 stitches removed from cvc. Pt asked to hold breath and vagal while line was taken out. Line out with ease. Held pressure x 7 minutes. Cath intact. No bleeding after pressure held. Bandage applied. NAD. Pt tolerated well. Daughter at bedside. Repositioned bed back level and hob raised per pt comfort.

## 2021-11-22 NOTE — Plan of Care (Signed)
  Problem: Acute Rehab PT Goals(only PT should resolve) Goal: Pt Will Go Supine/Side To Sit Outcome: Progressing Flowsheets (Taken 11/22/2021 1349) Pt will go Supine/Side to Sit:  with minimal assist  with moderate assist Goal: Patient Will Transfer Sit To/From Stand Outcome: Progressing Flowsheets (Taken 11/22/2021 1349) Patient will transfer sit to/from stand:  with minimal assist  with moderate assist Goal: Pt Will Transfer Bed To Chair/Chair To Bed Outcome: Progressing Flowsheets (Taken 11/22/2021 1349) Pt will Transfer Bed to Chair/Chair to Bed:  with min assist  with mod assist Goal: Pt Will Ambulate Outcome: Progressing Flowsheets (Taken 11/22/2021 1349) Pt will Ambulate:  15 feet  with moderate assist  with rolling walker   1:50 PM, 11/22/21 Beverly [name], MPT Physical Therapist with [facility] [phone] office [phone] mobile phone

## 2021-11-22 NOTE — Progress Notes (Addendum)
PROGRESS NOTE   Beverly [name]  ZSW:[id number] DOB: 10-17-1932 DOA: 11/18/2021 PCP: Benita Stabile, MD   Chief Complaint  Patient presents with   Hypotension   Altered Mental Status   Level of care: Telemetry  Brief Admission History:  Beverly [name] is a 88 y.o. female wit ha significant history of HTN, seizure post fall with TBI, MI, HLD, and mild dementia presenting today with AMS with hypotension in the setting of UTI. Surgical history includes bilateral knee replacements and cranial surgery for hematoma post fall. Patient presented to the ED with altered mentation as well as weakness and some hypotension. She was admitted with septic shock secondary to presumed pneumonia as well as UTI and required norepinephrine and vasopressin initially on admission.  She has recovered from septic shock remarkably well, no longer needing pressors. She is still weak, and PT recommends inpatient rehab at least for the short term.    Assessment and Plan: Principal Problem:   Septic shock (HCC)   Septic Shock secondary to UTI and/or Pneumonia-resolved -Continue IV Rocephin and IV Azithromycin -No further need for pressors and okay for telemetry floor -Hold further IV fluid -Monitor WBC with CBC daily, 11.2 on 11/22/21 --Daughter counseled and is understanding --Continue to provide family support --Palliative care consulted    Acute Metabolic Encephalopathy secondary to sepsis-resolving -Daughter is decision maker -Monitor electrolytes with BMP daily, Na 134 on 11/22/21.  -Monitor WBC with CBC daily, 11.2 on 11/22/21 -Continue to monitor okay to start diet   AKI secondary to sepsis-improving -Avoid nephrotoxic agents where possible. -Monitor Cr and BUN with BMP daily. Cr at baseline 0.47, BUN 10.  -Creatine baseline 0.4 as of 05/09/20   Hypoalbuminemia -2.9 on presentation 11/16 -Continue to monitor   HTN -Continue home Coreg. -Hold other home BP medications -May plan to resume by if blood  pressures are more elevated, currently well controlled with some moderately high systolic and normal diastolic.    Seizure Hx -post fall with TBI -Daughter states no seizure since the one after her fall -Continue to monitor   History of MI -Okay to resume aspirin   HLD -Resume atorvastatin   Mild dementia -Resume Namenda   GERD -Resume Protonix   Obesity -BMI 30.44   Hypokalemia/hypomagnesemia-Resolved -Repleted yesterday -Continue to monitor     DVT prophylaxis: Heparin Code Status: DNR Family Communication: Daughter on phone 11/20 Disposition Plan: Continue ongoing treatment with IV antibiotics Status is: Inpatient Remains inpatient appropriate because: IV medications.   Consultants:  Palliative Procedures:  None Antimicrobials:  Rocephin- First dose on 11/18/21 Day 5/7 Azithromycin- First dose on 11/19/21  Subjective: Beverly [name] was seen laying in bed this AM. She was moderately somnolent, but cooperative with examination. She did not appear to be in any acute stress or pain.  Objective: Vitals:   11/22/21 0048 11/22/21 0401 11/22/21 0558 11/22/21 0830  BP: (!) 154/68 (!) 171/76 (!) 148/60 (!) 159/74  Pulse: 87 93 81 97  Resp:  18  17  Temp:  98.4 F (36.9 C)  98.3 F (36.8 C)  TempSrc:  Oral  Oral  SpO2:  97%  97%  Weight:      Height:        Intake/Output Summary (Last 24 hours) at 11/22/2021 1358 Last data filed at 11/22/2021 0830 Gross per 24 hour  Intake 500.53 ml  Output 840 ml  Net -339.47 ml   Filed Weights   11/19/21 0421 11/20/21 0500 11/21/21 0350  Weight: 70.7 kg  71.6 kg 67.9 kg   Examination:  General exam: Appears calm and comfortable, sleepy, mildly obese.   Respiratory system: Clear to auscultation. Respiratory effort normal. Cardiovascular system: normal S1 & S2 heard. No JVD, murmurs, rubs, gallops or clicks. No pedal edema. Gastrointestinal system: Abdomen is nondistended, soft and nontender. No organomegaly or masses  felt. Normal bowel sounds heard. Central nervous system: Alert and oriented. No focal neurological deficits. Extremities: No gross deformities Skin: No rashes, lesions or ulcers. Psychiatry: Somnolent   Data Reviewed: I have personally reviewed following labs and imaging studies  CBC: Recent Labs  Lab 11/18/21 0516 11/19/21 0216 11/20/21 0336 11/21/21 0437 11/22/21 0824  WBC 22.0* 16.3* 11.7* 13.9* 11.2*  NEUTROABS 16.6*  --   --   --   --   HGB 11.4* 11.0* 10.7* 13.1 11.6*  HCT 36.0 33.6* 32.5* 38.7 34.8*  MCV 93.0 91.8 90.5 88.6 89.7  PLT 259 283 245 302 264    Basic Metabolic Panel: Recent Labs  Lab 11/18/21 0516 11/19/21 0216 11/20/21 0336 11/21/21 0437 11/22/21 0824  NA 133* 136 137 135 134*  K 4.9 4.2 3.1* 2.8* 3.5  CL 97* 103 99 94* 96*  CO2 24 24 30 31 31   GLUCOSE 161* 115* 101* 133* 99  BUN 50* 37* 21 12 10   CREATININE 1.84* 0.99 0.57 0.47 0.47  CALCIUM 9.2 8.6* 8.5* 8.5* 8.1*  MG  --  1.5* 1.8 1.4* 1.8    CBG: Recent Labs  Lab 11/18/21 1634 11/18/21 2055 11/19/21 0013 11/19/21 1534 11/21/21 2113  GLUCAP 122* 117* 118* 103* 131*    Recent Results (from the past 240 hour(s))  Resp Panel by RT-PCR (Flu A&B, Covid) Anterior Nasal Swab     Status: None   Collection Time: 11/18/21  5:16 AM   Specimen: Anterior Nasal Swab  Result Value Ref Range Status   SARS Coronavirus 2 by RT PCR NEGATIVE NEGATIVE Final    Comment: (NOTE) SARS-CoV-2 target nucleic acids are NOT DETECTED.  The SARS-CoV-2 RNA is generally detectable in upper respiratory specimens during the acute phase of infection. The lowest concentration of SARS-CoV-2 viral copies this assay can detect is 138 copies/mL. A negative result does not preclude SARS-Cov-2 infection and should not be used as the sole basis for treatment or other patient management decisions. A negative result may occur with  improper specimen collection/handling, submission of specimen other than nasopharyngeal  swab, presence of viral mutation(s) within the areas targeted by this assay, and inadequate number of viral copies(<138 copies/mL). A negative result must be combined with clinical observations, patient history, and epidemiological information. The expected result is Negative.  Fact Sheet for Patients:  2114  Fact Sheet for Healthcare Providers:  11/20/21  This test is no t yet approved or cleared by the [employer] FDA and  has been authorized for detection and/or diagnosis of SARS-CoV-2 by FDA under an Emergency Use Authorization (EUA). This EUA will remain  in effect (meaning this test can be used) for the duration of the COVID-19 declaration under Section 564(b)(1) of the Act, 21 U.S.C.section 360bbb-3(b)(1), unless the authorization is terminated  or revoked sooner.       Influenza A by PCR NEGATIVE NEGATIVE Final   Influenza B by PCR NEGATIVE NEGATIVE Final    Comment: (NOTE) The Xpert Xpress SARS-CoV-2/FLU/RSV plus assay is intended as an aid in the diagnosis of influenza from Nasopharyngeal swab specimens and should not be used as a sole basis for treatment. Nasal washings  and aspirates are unacceptable for Xpert Xpress SARS-CoV-2/FLU/RSV testing.  Fact Sheet for Patients: Blog[employer]Course.comhttps://www.fda.gov/media/152166/download  Fact Sheet for Healthcare Providers: SeriousBroker.ithttps://www.fda.gov/media/152162/download  This test is not yet approved or cleared by the [place] FDA and has been authorized for detection and/or diagnosis of SARS-CoV-2 by FDA under an Emergency Use Authorization (EUA). This EUA will remain in effect (meaning this test can be used) for the duration of the COVID-19 declaration under Section 564(b)(1) of the Act, 21 U.S.C. section 360bbb-3(b)(1), unless the authorization is terminated or revoked.  Performed at [facility], [address] [place], [place] [address]   Urine Culture      Status: None   Collection Time: 11/18/21  5:16 AM   Specimen: In/Out Cath Urine  Result Value Ref Range Status   Specimen Description   Final    IN/OUT CATH URINE Performed at [facility], [address] [place], [place] [address]    Special Requests   Final    NONE Performed at [facility], [address] [place], [place] [address]    Culture   Final    NO GROWTH Performed at [facility], [address] [place], [place] [address]    Report Status 11/19/2021 FINAL  Final  Blood Culture (routine x 2)     Status: None (Preliminary result)   Collection Time: 11/18/21  5:51 AM   Specimen: BLOOD  Result Value Ref Range Status   Specimen Description BLOOD BLOOD RIGHT HAND  Final   Special Requests   Final    BOTTLES DRAWN AEROBIC AND ANAEROBIC Blood Culture adequate volume BLOOD RIGHT HAND   Culture   Final    NO GROWTH 4 DAYS Performed at [facility], [address] [place], [place] [address]    Report Status PENDING  Incomplete  Blood Culture (routine x 2)     Status: None (Preliminary result)   Collection Time: 11/18/21  5:51 AM   Specimen: BLOOD  Result Value Ref Range Status   Specimen Description BLOOD BLOOD LEFT HAND  Final   Special Requests   Final    BOTTLES DRAWN AEROBIC AND ANAEROBIC Blood Culture adequate volume BLOOD LEFT HAND   Culture   Final    NO GROWTH 4 DAYS Performed at [facility], [address] [place], [place] [address]    Report Status PENDING  Incomplete  MRSA Next Gen by PCR, Nasal     Status: None   Collection Time: 11/18/21  9:29 AM   Specimen: Nasal Mucosa; Nasal Swab  Result Value Ref Range Status   MRSA by PCR Next Gen NOT DETECTED NOT DETECTED Final    Comment: (NOTE) The GeneXpert MRSA Assay (FDA approved for NASAL specimens only), is one component of a comprehensive MRSA colonization surveillance program. It is not intended to diagnose MRSA infection nor to guide or monitor treatment for MRSA infections. Test performance is  not FDA approved in patients less than 86 years old. Performed at [facility], [address] [place], [place] [address]      Radiology Studies: No results found.  Scheduled Meds:  aspirin  81 mg Oral Daily   atorvastatin  80 mg Oral q1800   carvedilol  6.25 mg Oral BID WC   Chlorhexidine Gluconate Cloth  6 each Topical Q0600   heparin  5,000 Units Subcutaneous Q8H   latanoprost  1 drop Both Eyes QHS   levETIRAcetam  500 mg Oral BID   memantine  5 mg Oral BID  mouth rinse  15 mL Mouth Rinse BID   pantoprazole  40 mg Oral Daily   risperiDONE  0.5 mg Oral BID   Continuous Infusions: Azithromycin-500mg  in 0.9% NaCl IVPB Rocephin 2g in 0.9% NaCl IVBP   LOS: 4 days   Time spent: 35 mins  Jake Shark, OMS IV How to contact the [facility] Attending or Consulting provider 7A - 7P or covering provider during after hours 7P -7A, for this patient?  Check the care team in [facility] and look for a) attending/consulting TRH provider listed and b) the [facility] team listed Log into [email or web address] and use [place]'s universal password to access. If you do not have the password, please contact the hospital operator. Locate the [facility] provider you are looking for under [facility] and page to a number that you can be directly reached. If you still have difficulty reaching the provider, please page the [facility] (Director on Call) for the Hospitalists listed on amion for assistance.  11/22/2021, 1:58 PM

## 2021-11-22 NOTE — Progress Notes (Signed)
Palliative: Beverly [name], [place], is sitting up in the [place] chair in her room.  She appears acutely/chronically ill and frail.  She greets me, making and somewhat keeping eye contact.  She is alert and oriented x3, able to make her basic needs known.  There is no family at bedside at this time although student RN is present attending to needs.  I asked Ms. Stille what happened.  She is shares that she is "weak".  We talk about her UTI and the treatment plan.  We talk about physical therapy evaluation.  Daughter had shared that Mrs. Sauseda was active with outpatient PT and had seen increases in mobility.  We talk about short-term rehab if qualified.  At this point, Mrs. Sulton has 24/7 caregivers in her home, some night.  She states that her preference would be to return to her own home.  Conference with attending, bedside nursing staff, transition of care team related to patient condition, needs, goals of care, disposition.  Plan: Continue to treat the treatable but no CPR or intubation.  Time for outcomes.  PT evaluation.  Active with outpatient palliative services through [facility], NP [place].   DNR/goldenrod form completed and placed on chart.  35 minutes Lillia Carmel, NP Palliative medicine team Team phone [phone] Greater than 50% of this time was spent counseling and coordinating care related to the above assessment and plan.

## 2021-11-22 NOTE — Evaluation (Signed)
Physical Therapy Evaluation Patient Details Name: Beverly [name] MRN: [id number] DOB: 12/21/1932 Today's Date: 11/22/2021  History of Present Illness  Beverly [name] is a 88 y.o. female wit ha significant history of HTN, seizure post fall with TBI, MI, HLD, and mild dementia presenting today with AMS with hypotension in the setting of UTI. Surgical history includes bilateral knee replacements and cranial surgery for hematoma post fall. History was obtained by daughter at bedside due to patient's AMS.  Patient lives with her daughter and was at her baseline (alert and oriented with mild dementia) on Monday 11/15/21 and was able to walk and participate with home PT. On Tuesday she became weaker and could not verbalize her complaints, telling her daughter "I'm not sure how to answer that." She became increasingly confused throughout the day. She has constant sitters and home health who noted this change. She was only slightly better Wednesday morning and was found to have a UTI and was given antibiotics. When the daughter came home Wednesday night, the patient was minimally responsive. Later in the night, she began trying to get out of the chair she sleeps in and was acting vastly different than her baseline. Her daughter took her blood pressure and found it to be low at 70s/50s. She called EMS who recommended transport.   Clinical Impression  Patient demonstrates slow labored movement for sitting up at bedside, very unsteady on feet and limited to 2-3 minutes of standing before having to sit due to c/o generalized weakness and fatigue. Patient able to take few side steps at bedside, incontinent of stool and tolerated sitting up in chair after therapy - nursing staff notified.  Patient will benefit from continued skilled physical therapy in hospital and recommended venue below to increase strength, balance, endurance for safe ADLs and gait.         Recommendations for follow up therapy are one component of a  multi-disciplinary discharge planning process, led by the attending physician.  Recommendations may be updated based on patient status, additional functional criteria and insurance authorization.  Follow Up Recommendations Skilled nursing-short term rehab (<3 hours/day) Can patient physically be transported by private vehicle: No    Assistance Recommended at Discharge Intermittent Supervision/Assistance  Patient can return home with the following  A lot of help with bathing/dressing/bathroom;A lot of help with walking and/or transfers;Help with stairs or ramp for entrance;Assistance with cooking/housework    Equipment Recommendations None recommended by PT  Recommendations for Other Services       Functional Status Assessment Patient has had a recent decline in their functional status and demonstrates the ability to make significant improvements in function in a reasonable and predictable amount of time.     Precautions / Restrictions Precautions Precautions: Fall Restrictions Weight Bearing Restrictions: No      Mobility  Bed Mobility Overal bed mobility: Needs Assistance Bed Mobility: Supine to Sit     Supine to sit: Mod assist     General bed mobility comments: increased time, labored movement    Transfers Overall transfer level: Needs assistance Equipment used: Rolling walker (2 wheels) Transfers: Sit to/from Stand, Bed to chair/wheelchair/BSC Sit to Stand: Mod assist   Step pivot transfers: Mod assist       General transfer comment: unsteady labored movement with poor tolerance for standing due to c/o weakness    Ambulation/Gait Ambulation/Gait assistance: Mod assist, Max assist Gait Distance (Feet): 4 Feet Assistive device: Rolling walker (2 wheels) Gait Pattern/deviations: Decreased step length - right,  Decreased step length - left, Decreased stride length Gait velocity: slow     General Gait Details: limited to a few slow labored side stepd before  having to sit due to fatigue and generalized weakness  Stairs            Wheelchair Mobility    Modified Rankin (Stroke Patients Only)       Balance Overall balance assessment: Needs assistance Sitting-balance support: Feet supported, No upper extremity supported Sitting balance-Leahy Scale: Fair Sitting balance - Comments: fair/good seated at EOB   Standing balance support: During functional activity, Reliant on assistive device for balance, Bilateral upper extremity supported Standing balance-Leahy Scale: Poor Standing balance comment: fair/poor using RW                             Pertinent Vitals/Pain Pain Assessment Pain Assessment: 0-10 Pain Score: 6  Pain Location: low back Pain Descriptors / Indicators: Sore, Discomfort Pain Intervention(s): Monitored during session, Limited activity within patient's tolerance, Repositioned    Home Living Family/patient expects to be discharged to:: Private residence Living Arrangements: Children Available Help at Discharge: Family;Available 24 hours/day Type of Home: House Home Access: Stairs to enter;Ramped entrance Entrance Stairs-Rails: Right Entrance Stairs-Number of Steps: 3   Home Layout: One level Home Equipment: Agricultural consultant (2 wheels);Wheelchair - manual;Grab bars - tub/shower;BSC/3in1      Prior Function Prior Level of Function : Needs assist       Physical Assist : Mobility (physical);ADLs (physical) Mobility (physical): Bed mobility;Transfers;Gait;Stairs   Mobility Comments: household ambulator using RW ADLs Comments: assisted by family     Hand Dominance   Dominant Hand: Right    Extremity/Trunk Assessment   Upper Extremity Assessment Upper Extremity Assessment: Generalized weakness    Lower Extremity Assessment Lower Extremity Assessment: Generalized weakness    Cervical / Trunk Assessment Cervical / Trunk Assessment: Kyphotic  Communication   Communication: No  difficulties  Cognition Arousal/Alertness: Awake/alert Behavior During Therapy: WFL for tasks assessed/performed Overall Cognitive Status: History of cognitive impairments - at baseline                                          General Comments      Exercises     Assessment/Plan    PT Assessment Patient needs continued PT services  PT Problem List Decreased strength;Decreased activity tolerance;Decreased balance;Decreased mobility       PT Treatment Interventions DME instruction;Gait training;Stair training;Functional mobility training;Therapeutic activities;Therapeutic exercise;Balance training;Patient/family education    PT Goals (Current goals can be found in the Care Plan section)  Acute Rehab PT Goals Patient Stated Goal: return home with family to assist PT Goal Formulation: With patient Time For Goal Achievement: 12/06/21 Potential to Achieve Goals: Good    Frequency Min 3X/week     Co-evaluation               AM-PAC PT "6 Clicks" Mobility  Outcome Measure Help needed turning from your back to your side while in a flat bed without using bedrails?: A Lot Help needed moving from lying on your back to sitting on the side of a flat bed without using bedrails?: A Lot Help needed moving to and from a bed to a chair (including a wheelchair)?: A Lot Help needed standing up from a chair using your arms (e.g., wheelchair or bedside  chair)?: A Lot Help needed to walk in hospital room?: A Lot Help needed climbing 3-5 steps with a railing? : Total 6 Click Score: 11    End of Session   Activity Tolerance: Patient tolerated treatment well;Patient limited by fatigue Patient left: in chair;with call bell/phone within reach;with chair alarm set Nurse Communication: Mobility status PT Visit Diagnosis: Unsteadiness on feet (R26.81);Other abnormalities of gait and mobility (R26.89);Muscle weakness (generalized) (M62.81)    Time: 3335-4562 PT Time  Calculation (min) (ACUTE ONLY): 23 min   Charges:   PT Evaluation $PT Eval Moderate Complexity: 1 Mod PT Treatments $Therapeutic Activity: 23-37 mins        1:48 PM, 11/22/21 Ocie Bob, MPT Physical Therapist with [facility] 336 [phone] office [phone] mobile phone

## 2021-11-22 NOTE — NC FL2 (Signed)
[place] LEVEL OF CARE SCREENING TOOL     IDENTIFICATION  Patient Name: Beverly [name] Birthdate: 1932-07-01 Sex: female Admission Date (Current Location): 11/18/2021  [facility] and [place] Number:  [employer] and Address:  [place] [address], [place]      Provider Number: [id number]  Attending Physician Name and Address:  Rodena Goldmann, DO  Relative Name and Phone Number:  Elby Showers (Daughter) [phone]    Current Level of Care: Hospital Recommended Level of Care:   Prior Approval Number:    Date Approved/Denied:   PASRR Number: [id number]  Discharge Plan: SNF    Current Diagnoses: Patient Active Problem List   Diagnosis Date Noted   Septic shock ([place]) 11/18/2021   Right epiretinal membrane 04/13/2021   Lobar pneumonia ([place]) 05/09/2020   Goals of care, counseling/discussion 05/09/2020   Lower urinary tract infectious disease    Sepsis due to undetermined organism ([place]) 05/07/2020   Pleural effusion, right    SIRS (systemic inflammatory response syndrome) ([place])    Sepsis ([place]) 05/04/2020   Pneumonia 05/02/2020   CAP (community acquired pneumonia) 05/01/2020   Anemia 05/01/2020   Heme positive stool 05/01/2020   Advanced nonexudative age-related macular degeneration of right eye with subfoveal involvement 11/11/2019   Exudative age-related macular degeneration of left eye with active choroidal neovascularization ([place]) 05/13/2019   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 neovascularization ([place]) 05/13/2019   Primary open angle glaucoma of both eyes, mild stage 05/13/2019   Age-related nuclear cataract of left eye 05/13/2019   Hypokalemia 01/12/2018   Hypomagnesemia 01/12/2018   COPD (chronic obstructive pulmonary disease) ([place]) 01/12/2018   Acute ST elevation myocardial infarction (STEMI) of inferior wall ([place]) 01/10/2018   Cardiac arrest ([place])    Dyslipidemia  (high LDL; low HDL)    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, not intractable, without status epilepticus ([place]) 06/23/2014   History of traumatic subdural hematoma 06/23/2014   Subdural hematoma ([place]) 10/18/2012   TIA (transient ischemic attack) 10/18/2012   HTN (hypertension) 10/18/2012   Hyponatremia 10/18/2012    Orientation RESPIRATION BLADDER Height & Weight     Self, Time, Situation, Place  Normal Incontinent Weight: 149 lb 11.1 oz (67.9 kg) Height:  5' (152.4 cm)  BEHAVIORAL SYMPTOMS/MOOD NEUROLOGICAL BOWEL NUTRITION STATUS      Incontinent Diet (heart healthy)  AMBULATORY STATUS COMMUNICATION OF NEEDS Skin   Limited Assist Verbally Normal                       Personal Care Assistance Level of Assistance  Bathing, Feeding, Dressing Bathing Assistance: Limited assistance Feeding assistance: Independent Dressing Assistance: Limited assistance     Functional Limitations Info  Sight, Hearing, Speech Sight Info: Adequate Hearing Info: Adequate Speech Info: Adequate    SPECIAL CARE FACTORS FREQUENCY  PT (By licensed PT)     PT Frequency: 5x/week              Contractures Contractures Info: Not present    Additional Factors Info  Code Status, Allergies, Psychotropic Code Status Info: DNR Allergies Info: LIsinopril, Eggs or egg-derived products, morphine and related, penicillins Psychotropic Info: XANAX         Current Medications (11/22/2021):  This is the current hospital active medication list Current Facility-Administered Medications  Medication Dose Route Frequency Provider Last Rate Last Admin   acetaminophen (TYLENOL) tablet 650 mg  650  mg Oral Q6H PRN Maurilio Lovely D, DO   650 mg at 11/22/21 1201   Or   acetaminophen (TYLENOL) suppository 650 mg  650 mg Rectal Q6H PRN Sherryll Burger, Pratik D, DO       ALPRAZolam Prudy Feeler) tablet 0.5 mg  0.5 mg Oral TID PRN Sherryll Burger, Pratik D, DO   0.5 mg at 11/22/21  1201   aspirin chewable tablet 81 mg  81 mg Oral Daily Maurilio Lovely D, DO   81 mg at 11/22/21 0903   atorvastatin (LIPITOR) tablet 80 mg  80 mg Oral q1800 Maurilio Lovely D, DO   80 mg at 11/21/21 1718   azithromycin (ZITHROMAX) 500 mg in sodium chloride 0.9 % 250 mL IVPB  500 mg Intravenous Q24H Maurilio Lovely D, DO 250 mL/hr at 11/22/21 0910 500 mg at 11/22/21 0910   carvedilol (COREG) tablet 6.25 mg  6.25 mg Oral BID WC Sherryll Burger, Pratik D, DO   6.25 mg at 11/22/21 0902   cefTRIAXone (ROCEPHIN) 2 g in sodium chloride 0.9 % 100 mL IVPB  2 g Intravenous Q24H Sherryll Burger, Pratik D, DO   Stopped at 11/22/21 0548   Chlorhexidine Gluconate Cloth 2 % PADS 6 each  6 each Topical Q0600 Maurilio Lovely D, DO   6 each at 11/22/21 0533   guaiFENesin (ROBITUSSIN) 100 MG/5ML liquid 5 mL  5 mL Oral Q4H PRN Maurilio Lovely D, DO   5 mL at 11/19/21 1857   heparin injection 5,000 Units  5,000 Units Subcutaneous Q8H Shah, Pratik D, DO   5,000 Units at 11/22/21 1514   hydrALAZINE (APRESOLINE) injection 10 mg  10 mg Intravenous Q6H PRN Adefeso, [place]ad[place], DO   10 mg at 11/21/21 1519   latanoprost (XALATAN) 0.005 % ophthalmic solution 1 drop  1 drop Both Eyes QHS Sherryll Burger, Pratik D, DO   1 drop at 11/21/21 2237   levETIRAcetam (KEPPRA) tablet 500 mg  500 mg Oral BID Sherryll Burger, Pratik D, DO   500 mg at 11/22/21 0902   memantine (NAMENDA) tablet 5 mg  5 mg Oral BID Sherryll Burger, Pratik D, DO   5 mg at 11/22/21 0902   ondansetron (ZOFRAN) tablet 4 mg  4 mg Oral Q6H PRN Sherryll Burger, Pratik D, DO       Or   ondansetron [facility]) injection 4 mg  4 mg Intravenous Q6H PRN Sherryll Burger, Pratik D, DO   4 mg at 11/21/21 0501   Oral care mouth rinse  15 mL Mouth Rinse PRN Maurilio Lovely D, DO       Oral care mouth rinse  15 mL Mouth Rinse BID Sherryll Burger, Pratik D, DO   15 mL at 11/22/21 1235   pantoprazole (PROTONIX) EC tablet 40 mg  40 mg Oral Daily Sherryll Burger, Pratik D, DO   40 mg at 11/22/21 4008   potassium chloride SA (KLOR-CON M) CR tablet 40 mEq  40 mEq Oral Once Sherryll Burger, Pratik D, DO        risperiDONE (RISPERDAL) tablet 0.5 mg  0.5 mg Oral BID Sherryll Burger, Pratik D, DO   0.5 mg at 11/22/21 6761     Discharge Medications: Please see discharge summary for a list of discharge medications.  Relevant Imaging Results:  Relevant Lab Results:   Additional Information SSN: [id number]. Per daughter, patient is vaccinated for COVID.  Sahaana Weitman, Juleen China, LCSW

## 2021-11-22 NOTE — TOC Initial Note (Addendum)
Transition of Care [facility]) - Initial/Assessment Note    Patient Details  Name: Beverly [name] MRN: [id number] Date of Birth: [birth year]  [facility] CM/SW Contact:    Annice Needy, LCSW Phone Number: 11/22/2021, 3:14 PM  Clinical Narrative:                 PT recommends SNF. Daughter, [name], is agreeable as she states attending spoke to both her and patient regarding SNF. Patient is vaccinated for COVID. Referrals sent to facilities requested. Auth started   Expected Discharge Plan: Skilled Nursing Facility Barriers to Discharge: Continued Medical Work up   Patient Goals and CMS Choice Patient states their goals for this hospitalization and ongoing recovery are:: rehab then home      Expected Discharge Plan and Services Expected Discharge Plan: Skilled Nursing Facility       Living arrangements for the past 2 months: Single Family Home                                      Prior Living Arrangements/Services Living arrangements for the past 2 months: Single Family Home Lives with:: Adult Children Patient language and need for interpreter reviewed:: Yes Do you feel safe going back to the place where you live?: Yes      Need for Family Participation in Patient Care: Yes (Comment) Care giver support system in place?: Yes (comment) Current home services: DME, Home PT, Home RN Criminal Activity/Legal Involvement Pertinent to Current Situation/Hospitalization: No - Comment as needed  Activities of Daily Living Home Assistive Devices/Equipment: Walker (specify type) ADL Screening (condition at time of admission) Patient's cognitive ability adequate to safely complete daily activities?: Yes Is the patient deaf or have difficulty hearing?: No Does the patient have difficulty seeing, even when wearing glasses/contacts?: No Does the patient have difficulty concentrating, remembering, or making decisions?: No Patient able to express need for assistance with  ADLs?: Yes Does the patient have difficulty dressing or bathing?: Yes Independently performs ADLs?: Yes (appropriate for developmental age) Does the patient have difficulty walking or climbing stairs?: Yes Weakness of Legs: Both Weakness of Arms/Hands: None  Permission Sought/Granted Permission sought to share information with : Family Supports    Share Information with NAME: [name], daughter           Emotional Assessment       Orientation: : Oriented to Self, Oriented to Place, Oriented to  Time, Oriented to Situation Alcohol / Substance Use: Not Applicable Psych Involvement: No (comment)  Admission diagnosis:  Septic shock (HCC) [A41.9, R65.21] Hypotension, unspecified hypotension type [I95.9] Urinary tract infection without hematuria, site unspecified [N39.0] Sepsis, due to unspecified organism, unspecified whether acute organ dysfunction present [facility]) [A41.9] Patient Active Problem List   Diagnosis Date Noted   Septic shock (HCC) 11/18/2021   Right epiretinal membrane 04/13/2021   Lobar pneumonia (HCC) 05/09/2020   Goals of care, counseling/discussion 05/09/2020   Lower urinary tract infectious disease    Sepsis due to undetermined organism (HCC) 05/07/2020   Pleural effusion, right    SIRS (systemic inflammatory response syndrome) (HCC)    Sepsis (HCC) 05/04/2020   Pneumonia 05/02/2020   CAP (community acquired pneumonia) 05/01/2020   Anemia 05/01/2020   Heme positive stool 05/01/2020   Advanced nonexudative age-related macular degeneration of right eye with subfoveal involvement 11/11/2019   Exudative age-related macular degeneration of left eye with active choroidal neovascularization (  HCC) 05/13/2019   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 neovascularization (HCC) 05/13/2019   Primary open angle glaucoma of both eyes, mild stage 05/13/2019   Age-related nuclear cataract of left eye 05/13/2019   Hypokalemia 01/12/2018    Hypomagnesemia 01/12/2018   COPD (chronic obstructive pulmonary disease) (HCC) 01/12/2018   Acute ST elevation myocardial infarction (STEMI) of inferior wall (HCC) 01/10/2018   Cardiac arrest (HCC)    Dyslipidemia (high LDL; low HDL)    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, not intractable, without status epilepticus (HCC) 06/23/2014   History of traumatic subdural hematoma 06/23/2014   Subdural hematoma (HCC) 10/18/2012   TIA (transient ischemic attack) 10/18/2012   HTN (hypertension) 10/18/2012   Hyponatremia 10/18/2012   PCP:  Benita Stabile, MD Pharmacy:   [facility] - [place], [place] - [address] [place] [address] Phone: [phone] Fax: [phone]  [facility] - [place], [place] - [address]  [place] [address] Phone: [phone] Fax: [phone]     Social Determinants of Health (SDOH) Interventions    Readmission Risk Interventions    05/04/2020   11:46 AM  Readmission Risk Prevention Plan  Transportation Screening Complete  Home Care Screening Complete  Medication Review (RN CM) Complete

## 2021-11-23 DIAGNOSIS — Z515 Encounter for palliative care: Secondary | ICD-10-CM | POA: Diagnosis not present

## 2021-11-23 DIAGNOSIS — M6281 Muscle weakness (generalized): Secondary | ICD-10-CM | POA: Diagnosis not present

## 2021-11-23 DIAGNOSIS — N390 Urinary tract infection, site not specified: Secondary | ICD-10-CM | POA: Diagnosis not present

## 2021-11-23 DIAGNOSIS — H353221 Exudative age-related macular degeneration, left eye, with active choroidal neovascularization: Secondary | ICD-10-CM | POA: Diagnosis not present

## 2021-11-23 DIAGNOSIS — I700 Atherosclerosis of aorta: Secondary | ICD-10-CM | POA: Diagnosis not present

## 2021-11-23 DIAGNOSIS — I10 Essential (primary) hypertension: Secondary | ICD-10-CM | POA: Diagnosis not present

## 2021-11-23 DIAGNOSIS — J449 Chronic obstructive pulmonary disease, unspecified: Secondary | ICD-10-CM | POA: Diagnosis not present

## 2021-11-23 DIAGNOSIS — M1990 Unspecified osteoarthritis, unspecified site: Secondary | ICD-10-CM | POA: Diagnosis not present

## 2021-11-23 DIAGNOSIS — G3184 Mild cognitive impairment, so stated: Secondary | ICD-10-CM | POA: Diagnosis not present

## 2021-11-23 DIAGNOSIS — R488 Other symbolic dysfunctions: Secondary | ICD-10-CM | POA: Diagnosis not present

## 2021-11-23 DIAGNOSIS — Z9181 History of falling: Secondary | ICD-10-CM | POA: Diagnosis not present

## 2021-11-23 DIAGNOSIS — H401131 Primary open-angle glaucoma, bilateral, mild stage: Secondary | ICD-10-CM | POA: Diagnosis not present

## 2021-11-23 DIAGNOSIS — N179 Acute kidney failure, unspecified: Secondary | ICD-10-CM | POA: Diagnosis not present

## 2021-11-23 DIAGNOSIS — K219 Gastro-esophageal reflux disease without esophagitis: Secondary | ICD-10-CM | POA: Diagnosis not present

## 2021-11-23 DIAGNOSIS — A419 Sepsis, unspecified organism: Secondary | ICD-10-CM | POA: Diagnosis not present

## 2021-11-23 DIAGNOSIS — Z741 Need for assistance with personal care: Secondary | ICD-10-CM | POA: Diagnosis not present

## 2021-11-23 DIAGNOSIS — G40109 Localization-related (focal) (partial) symptomatic epilepsy and epileptic syndromes with simple partial seizures, not intractable, without status epilepticus: Secondary | ICD-10-CM | POA: Diagnosis not present

## 2021-11-23 DIAGNOSIS — E876 Hypokalemia: Secondary | ICD-10-CM | POA: Diagnosis not present

## 2021-11-23 DIAGNOSIS — H353114 Nonexudative age-related macular degeneration, right eye, advanced atrophic with subfoveal involvement: Secondary | ICD-10-CM | POA: Diagnosis not present

## 2021-11-23 DIAGNOSIS — H40053 Ocular hypertension, bilateral: Secondary | ICD-10-CM | POA: Diagnosis not present

## 2021-11-23 DIAGNOSIS — E871 Hypo-osmolality and hyponatremia: Secondary | ICD-10-CM | POA: Diagnosis not present

## 2021-11-23 DIAGNOSIS — R278 Other lack of coordination: Secondary | ICD-10-CM | POA: Diagnosis not present

## 2021-11-23 DIAGNOSIS — R6521 Severe sepsis with septic shock: Secondary | ICD-10-CM | POA: Diagnosis not present

## 2021-11-23 DIAGNOSIS — H353222 Exudative age-related macular degeneration, left eye, with inactive choroidal neovascularization: Secondary | ICD-10-CM | POA: Diagnosis not present

## 2021-11-23 DIAGNOSIS — R262 Difficulty in walking, not elsewhere classified: Secondary | ICD-10-CM | POA: Diagnosis not present

## 2021-11-23 DIAGNOSIS — I25119 Atherosclerotic heart disease of native coronary artery with unspecified angina pectoris: Secondary | ICD-10-CM | POA: Diagnosis not present

## 2021-11-23 DIAGNOSIS — Z7189 Other specified counseling: Secondary | ICD-10-CM | POA: Diagnosis not present

## 2021-11-23 DIAGNOSIS — J42 Unspecified chronic bronchitis: Secondary | ICD-10-CM | POA: Diagnosis not present

## 2021-11-23 DIAGNOSIS — E785 Hyperlipidemia, unspecified: Secondary | ICD-10-CM | POA: Diagnosis not present

## 2021-11-23 DIAGNOSIS — F339 Major depressive disorder, recurrent, unspecified: Secondary | ICD-10-CM | POA: Diagnosis not present

## 2021-11-23 DIAGNOSIS — D649 Anemia, unspecified: Secondary | ICD-10-CM | POA: Diagnosis not present

## 2021-11-23 LAB — CULTURE, BLOOD (ROUTINE X 2)
Culture: NO GROWTH
Culture: NO GROWTH

## 2021-11-23 LAB — BASIC METABOLIC PANEL
Anion gap: 7 (ref 5–15)
BUN: 7 mg/dL — ABNORMAL LOW (ref 8–23)
CO2: 29 mmol/L (ref 22–32)
Calcium: 7.9 mg/dL — ABNORMAL LOW (ref 8.9–10.3)
Chloride: 96 mmol/L — ABNORMAL LOW (ref 98–111)
Creatinine, Ser: 0.41 mg/dL — ABNORMAL LOW (ref 0.44–1.00)
GFR, Estimated: >60 mL/min (ref >60)
Glucose, Bld: 111 mg/dL — ABNORMAL HIGH (ref 70–99)
Potassium: 3.1 mmol/L — ABNORMAL LOW (ref 3.5–5.1)
Sodium: 132 mmol/L — ABNORMAL LOW (ref 135–145)

## 2021-11-23 LAB — CBC
HCT: 32.5 % — ABNORMAL LOW (ref 36.0–46.0)
Hemoglobin: 10.9 g/dL — ABNORMAL LOW (ref 12.0–15.0)
MCH: 29.7 pg (ref 26.0–34.0)
MCHC: 33.5 g/dL (ref 30.0–36.0)
MCV: 88.6 fL (ref 80.0–100.0)
Platelets: 269 10*3/uL (ref 150–400)
RBC: 3.67 MIL/uL — ABNORMAL LOW (ref 3.87–5.11)
RDW: 17.2 % — ABNORMAL HIGH (ref 11.5–15.5)
WBC: 12.3 10*3/uL — ABNORMAL HIGH (ref 4.0–10.5)
nRBC: 0 % (ref 0.0–0.2)

## 2021-11-23 LAB — SARS CORONAVIRUS 2 BY RT PCR: SARS Coronavirus 2 by RT PCR: NEGATIVE

## 2021-11-23 LAB — MAGNESIUM: Magnesium: 1.7 mg/dL (ref 1.7–2.4)

## 2021-11-23 MED ORDER — CALCIUM CARBONATE ANTACID 500 MG PO CHEW
1.0000 | CHEWABLE_TABLET | Freq: Every day | ORAL | Status: DC | PRN
  Administered 2021-11-23: 200 mg via ORAL
  Filled 2021-11-23: qty 1

## 2021-11-23 MED ORDER — ALPRAZOLAM 0.5 MG PO TABS: 0.5000 mg | ORAL_TABLET | Freq: Three times a day (TID) | ORAL | 0 refills | Status: DC | PRN

## 2021-11-23 MED ORDER — POTASSIUM CHLORIDE CRYS ER 20 MEQ PO TBCR
40.0000 meq | EXTENDED_RELEASE_TABLET | Freq: Once | ORAL | Status: AC
  Administered 2021-11-23: 40 meq via ORAL
  Filled 2021-11-23: qty 2

## 2021-11-23 MED ORDER — GUAIFENESIN 100 MG/5ML PO LIQD: 5.0000 mL | ORAL | 0 refills | Status: DC | PRN

## 2021-11-23 MED ORDER — HYDROCODONE-ACETAMINOPHEN 10-325 MG PO TABS: 1.0000 | ORAL_TABLET | Freq: Four times a day (QID) | ORAL | 0 refills | Status: DC | PRN

## 2021-11-23 NOTE — Progress Notes (Signed)
Palliative: Beverly [name] is lying quietly in bed.  She appears chronically ill and somewhat frail.  She is alert and oriented, making and mostly keeping eye contact.  She is able to make her basic needs known.  There is no family at bedside at this time.  She does look improved daily.  She is able to tell me today that she was admitted for urinary tract infection.  We talk about her continued improvements.  We talk about her functional status and physical therapy/rehab.  Although she tells me that she would prefer to go home, it seems that she and her daughter have decided to discharge to short-term rehab.  I reassured her that this may be for limited time, maybe even less than a week.  I encouraged her to do the best she can.  Conference with attending, bedside nursing staff, transition of care team related to patient condition, needs, goals of care, disposition.  Plan: Continue to treat the treatable but no CPR or intubation.  Short-term rehab with goal of returning home.  Lives in her daughter's home with paid caregivers. DNR placed on chart yesterday.     35 minutes Beverly Carmel, NP Palliative medicine team Team phone [phone] Greater than 50% of this time was spent counseling and coordinating care related to the above assessment and plan.

## 2021-11-23 NOTE — Discharge Summary (Signed)
Physician Discharge Summary  Beverly AmericanJean C Ingalls ZOX:[id number] DOB: 12/01/1932 DOA: 11/18/2021  PCP: Benita StabileHall, Beverly Z, MD  Admit date: 11/18/2021  Discharge date: 11/23/2021  Admitted From:Home  Disposition:  SNF  Recommendations for Outpatient Follow-up:  Follow up with PCP in 1-2 weeks Patient has completed course of antibiotics for pneumonia and may continue on medications as noted below  Home Health: None  Equipment/Devices: None  Discharge Condition:Stable  CODE STATUS: DNR  Diet recommendation: Heart Healthy  Brief/Interim Summary:  Beverly [name] is a 88 y.o. female wit ha significant history of HTN, seizure post fall with TBI, MI, HLD, and mild dementia presenting today with AMS with hypotension in the setting of UTI. Surgical history includes bilateral knee replacements and cranial surgery for hematoma post fall. Patient presented to the ED with altered mentation as well as weakness and some hypotension. She was admitted with septic shock secondary to presumed pneumonia as well as UTI and required norepinephrine and vasopressin initially on admission.  She has recovered from septic shock remarkably well, no longer needing pressors. She is still weak, and PT recommends inpatient rehab at least for the short term.  She has completed her course of antibiotics and is currently in stable condition for discharge.  Her mentation has returned to baseline as well.  She may resume her outpatient palliative services after rehabilitation has been completed.  Discharge Diagnoses:  Principal Problem:   Septic shock (HCC)  Principal discharge diagnosis: Septic shock secondary to community-acquired pneumonia-resolved.  Discharge Instructions  Discharge Instructions     Diet - low sodium heart healthy   Complete by: As directed    Increase activity slowly   Complete by: As directed       Allergies as of 11/23/2021       Reactions   Lisinopril Swelling   Eggs Or Egg-derived Products  Itching   Patient reports that she can eat eggs but does not tolerate egg derived products   Morphine And Related    Headache   Penicillins Itching   DID THE REACTION INVOLVE: Swelling of the face/tongue/throat, SOB, or low BP? No Sudden or severe rash/hives, skin peeling, or the inside of the mouth or nose? No Did it require medical treatment? No When did it last happen? Unknown If all above answers are "NO", may proceed with cephalosporin use.        Medication List     STOP taking these medications    cefdinir 300 MG capsule Commonly known as: OMNICEF   doxycycline 100 MG tablet Commonly known as: VIBRA-TABS   methocarbamol 500 MG tablet Commonly known as: ROBAXIN   predniSONE 50 MG tablet Commonly known as: DELTASONE       TAKE these medications    acetaminophen 500 MG tablet Commonly known as: TYLENOL Take 1 tablet (500 mg total) by mouth as needed (no more than 3 gm / day).   ALPRAZolam 0.5 MG tablet Commonly known as: XANAX Take 1 tablet (0.5 mg total) by mouth 3 (three) times daily as needed for anxiety.   amLODipine 5 MG tablet Commonly known as: NORVASC Take 1 tablet (5 mg total) by mouth daily.   aspirin 81 MG chewable tablet Chew 1 tablet (81 mg total) by mouth daily.   atorvastatin 80 MG tablet Commonly known as: LIPITOR Take 1 tablet (80 mg total) by mouth daily at 6 PM.   carvedilol 6.25 MG tablet Commonly known as: COREG TAKE ONE TABLET (6.25MG  TOTAL) BY MOUTH TWO TIMES DAILY  celecoxib 200 MG capsule Commonly known as: CELEBREX Take 200 mg by mouth daily.   cholecalciferol 1000 units tablet Commonly known as: VITAMIN D Take 1,000 Units by mouth daily.   clotrimazole 10 MG troche Commonly known as: MYCELEX Take 1 tablet (10 mg total) by mouth 5 (five) times daily.   dicyclomine 20 MG tablet Commonly known as: BENTYL Take 20 mg by mouth in the morning and at bedtime.   guaiFENesin 100 MG/5ML liquid Commonly known as:  ROBITUSSIN Take 5 mLs by mouth every 4 (four) hours as needed for cough or to loosen phlegm.   HYDROcodone-acetaminophen 10-325 MG tablet Commonly known as: NORCO Take 1 tablet by mouth every 6 (six) hours as needed.   latanoprost 0.005 % ophthalmic solution Commonly known as: XALATAN INSTILL ONE DROP IN BOTH EYES NIGHTLY   levETIRAcetam 500 MG tablet Commonly known as: KEPPRA Take 500 mg by mouth 2 (two) times daily.   memantine 5 MG tablet Commonly known as: NAMENDA Take 5 mg by mouth 2 (two) times daily.   nitroGLYCERIN 0.4 MG SL tablet Commonly known as: NITROSTAT Place 1 tablet (0.4 mg total) under the tongue every 5 (five) minutes x 3 doses as needed for chest pain.   pantoprazole 40 MG tablet Commonly known as: PROTONIX Take 40 mg by mouth daily.   risperiDONE 0.5 MG tablet Commonly known as: RISPERDAL Take 0.5 mg by mouth 2 (two) times daily.   SYSTANE BALANCE OP Place 1-2 drops into both eyes daily as needed (dry eyes).        Follow-up Information     Benita Stabile, MD. Schedule an appointment as soon as possible for a visit in 1 week(s).   Specialty: Internal Medicine Contact information: [address] [place] [address] [phone]                Allergies  Allergen Reactions   Lisinopril Swelling   Eggs Or Egg-Derived Products Itching    Patient reports that she can eat eggs but does not tolerate egg derived products   Morphine And Related     Headache   Penicillins Itching    DID THE REACTION INVOLVE: Swelling of the face/tongue/throat, SOB, or low BP? No Sudden or severe rash/hives, skin peeling, or the inside of the mouth or nose? No Did it require medical treatment? No When did it last happen? Unknown If all above answers are "NO", may proceed with cephalosporin use.     Consultations: None   Procedures/Studies: DG Chest Portable 1 View  Result Date: 11/18/2021 CLINICAL DATA:  Status post central line placement  EXAM: PORTABLE CHEST 1 VIEW COMPARISON:  11/18/2021 FINDINGS: Diffuse bilateral interstitial thickening. Small right pleural effusion. Bibasilar airspace disease likely reflecting atelectasis. Stable cardiomediastinal silhouette. No pneumothorax. Left sided central venous catheter with the tip projecting over the SVC. IMPRESSION: 1. Left sided central venous catheter with the tip projecting over the SVC. No pneumothorax. 2. Bilateral small pleural effusions with bibasilar atelectasis. Electronically Signed   By: Elige Ko M.D.   On: 11/18/2021 09:09   [place] EKG SITE RITE  Result Date: 11/18/2021 If Site Rite image not attached, placement could not be confirmed due to current cardiac rhythm.  DG Chest Port 1 View  Result Date: 11/18/2021 CLINICAL DATA:  88 year old female with possible sepsis. EXAM: PORTABLE CHEST 1 VIEW COMPARISON:  Chest x-ray 05/01/2020. FINDINGS: Today's study is limited by considerable patient rotation to the right and slumped kyphotic positioning, which grossly  distorts normal anatomical structures. With these limitations in mind, lung volumes are low. Opacity at the left base which may reflect atelectasis and/or consolidation, with superimposed small left pleural effusion. Within the limitations of today's examination, the right lung appears clear. No definite right pleural effusion. No pneumothorax. No evidence of pulmonary edema. Heart size is mildly enlarged. Mediastinal contours are grossly distorted. IMPRESSION: 1. Limited examination demonstrating low lung volumes with probable left left basilar subsegmental atelectasis and small left pleural effusion. Electronically Signed   By: Trudie Reed M.D.   On: 11/18/2021 05:41     Discharge Exam: Vitals:   11/23/21 0518 11/23/21 0948  BP: (!) 149/79 (!) 173/81  Pulse: 95 99  Resp: 20   Temp: 97.7 F (36.5 C) 97.7 F (36.5 C)  SpO2: 93% 94%   Vitals:   11/22/21 2121 11/22/21 2330 11/23/21 0518 11/23/21 0948  BP: (!)  180/80 (!) 140/61 (!) 149/79 (!) 173/81  Pulse: 88 79 95 99  Resp: 16  20   Temp: 98 F (36.7 C)  97.7 F (36.5 C) 97.7 F (36.5 C)  TempSrc: Oral  Oral Oral  SpO2: 91%  93% 94%  Weight:      Height:        General: Pt is alert, awake, not in acute distress Cardiovascular: RRR, S1/S2 +, no rubs, no gallops Respiratory: CTA bilaterally, no wheezing, no rhonchi Abdominal: Soft, NT, ND, bowel sounds + Extremities: no edema, no cyanosis    The results of significant diagnostics from this hospitalization (including imaging, microbiology, ancillary and laboratory) are listed below for reference.     Microbiology: Recent Results (from the past 240 hour(s))  Resp Panel by RT-PCR (Flu A&B, Covid) Anterior Nasal Swab     Status: None   Collection Time: 11/18/21  5:16 AM   Specimen: Anterior Nasal Swab  Result Value Ref Range Status   SARS Coronavirus 2 by RT PCR NEGATIVE NEGATIVE Final    Comment: (NOTE) SARS-CoV-2 target nucleic acids are NOT DETECTED.  The SARS-CoV-2 RNA is generally detectable in upper respiratory specimens during the acute phase of infection. The lowest concentration of SARS-CoV-2 viral copies this assay can detect is 138 copies/mL. A negative result does not preclude SARS-Cov-2 infection and should not be used as the sole basis for treatment or other patient management decisions. A negative result may occur with  improper specimen collection/handling, submission of specimen other than nasopharyngeal swab, presence of viral mutation(s) within the areas targeted by this assay, and inadequate number of viral copies(<138 copies/mL). A negative result must be combined with clinical observations, patient history, and epidemiological information. The expected result is Negative.  Fact Sheet for Patients:  BloggerCourse.com  Fact Sheet for Healthcare Providers:  SeriousBroker.it  This test is no t yet approved  or cleared by the [place] FDA and  has been authorized for detection and/or diagnosis of SARS-CoV-2 by FDA under an Emergency Use Authorization (EUA). This EUA will remain  in effect (meaning this test can be used) for the duration of the COVID-19 declaration under Section 564(b)(1) of the Act, 21 U.S.C.section 360bbb-3(b)(1), unless the authorization is terminated  or revoked sooner.       Influenza A by PCR NEGATIVE NEGATIVE Final   Influenza B by PCR NEGATIVE NEGATIVE Final    Comment: (NOTE) The Xpert Xpress SARS-CoV-2/FLU/RSV plus assay is intended as an aid in the diagnosis of influenza from Nasopharyngeal swab specimens and should not be used as a sole basis for  treatment. Nasal washings and aspirates are unacceptable for Xpert Xpress SARS-CoV-2/FLU/RSV testing.  Fact Sheet for Patients: BloggerCourse.com  Fact Sheet for Healthcare Providers: SeriousBroker.it  This test is not yet approved or cleared by the [place] FDA and has been authorized for detection and/or diagnosis of SARS-CoV-2 by FDA under an Emergency Use Authorization (EUA). This EUA will remain in effect (meaning this test can be used) for the duration of the COVID-19 declaration under Section 564(b)(1) of the Act, 21 U.S.C. section 360bbb-3(b)(1), unless the authorization is terminated or revoked.  Performed at [facility], [address], [place], [place] [address]   Urine Culture     Status: None   Collection Time: 11/18/21  5:16 AM   Specimen: In/Out Cath Urine  Result Value Ref Range Status   Specimen Description   Final    IN/OUT CATH URINE Performed at [facility], [address] [place], [place] [address]    Special Requests   Final    NONE Performed at [facility], [address], [place], [place] [address]    Culture   Final    NO GROWTH Performed at [facility], [address], [place], [place] [address]    Report  Status 11/19/2021 FINAL  Final  Blood Culture (routine x 2)     Status: None   Collection Time: 11/18/21  5:51 AM   Specimen: BLOOD  Result Value Ref Range Status   Specimen Description BLOOD BLOOD RIGHT HAND  Final   Special Requests   Final    BOTTLES DRAWN AEROBIC AND ANAEROBIC Blood Culture adequate volume BLOOD RIGHT HAND   Culture   Final    NO GROWTH 5 DAYS Performed at [facility], [address], [place], [place] [address]    Report Status 11/23/2021 FINAL  Final  Blood Culture (routine x 2)     Status: None   Collection Time: 11/18/21  5:51 AM   Specimen: BLOOD  Result Value Ref Range Status   Specimen Description BLOOD BLOOD LEFT HAND  Final   Special Requests   Final    BOTTLES DRAWN AEROBIC AND ANAEROBIC Blood Culture adequate volume BLOOD LEFT HAND   Culture   Final    NO GROWTH 5 DAYS Performed at [facility], [address], [place], [place] [address]    Report Status 11/23/2021 FINAL  Final  MRSA Next Gen by PCR, Nasal     Status: None   Collection Time: 11/18/21  9:29 AM   Specimen: Nasal Mucosa; Nasal Swab  Result Value Ref Range Status   MRSA by PCR Next Gen NOT DETECTED NOT DETECTED Final    Comment: (NOTE) The GeneXpert MRSA Assay (FDA approved for NASAL specimens only), is one component of a comprehensive MRSA colonization surveillance program. It is not intended to diagnose MRSA infection nor to guide or monitor treatment for MRSA infections. Test performance is not FDA approved in patients less than 86 years old. Performed at [facility], [address], [place], [place] [address]      Labs: BNP (last 3 results) No results for input(s): "BNP" in the last 8760 hours. Basic Metabolic Panel: Recent Labs  Lab 11/19/21 0216 11/20/21 0336 11/21/21 0437 11/22/21 0824 11/23/21 0353  NA 136 137 135 134* 132*  K 4.2 3.1* 2.8* 3.5 3.1*  CL 103 99 94* 96* 96*  CO2 24 30 31 31 29   GLUCOSE 115* 101* 133* 99 111*  BUN 37* 21 12 10  7*  CREATININE  0.99  0.57 0.47 0.47 0.41*  CALCIUM 8.6* 8.5* 8.5* 8.1* 7.9*  MG 1.5* 1.8 1.4* 1.8 1.7   Liver Function Tests: Recent Labs  Lab 11/18/21 0516  AST 22  ALT 13  ALKPHOS 93  BILITOT 0.8  PROT 6.5  ALBUMIN 2.9*   No results for input(s): "LIPASE", "AMYLASE" in the last 168 hours. No results for input(s): "AMMONIA" in the last 168 hours. CBC: Recent Labs  Lab 11/18/21 0516 11/19/21 0216 11/20/21 0336 11/21/21 0437 11/22/21 0824 11/23/21 0353  WBC 22.0* 16.3* 11.7* 13.9* 11.2* 12.3*  NEUTROABS 16.6*  --   --   --   --   --   HGB 11.4* 11.0* 10.7* 13.1 11.6* 10.9*  HCT 36.0 33.6* 32.5* 38.7 34.8* 32.5*  MCV 93.0 91.8 90.5 88.6 89.7 88.6  PLT 259 283 245 302 264 269   Cardiac Enzymes: No results for input(s): "CKTOTAL", "CKMB", "CKMBINDEX", "TROPONINI" in the last 168 hours. BNP: Invalid input(s): "POCBNP" CBG: Recent Labs  Lab 11/18/21 2055 11/19/21 0013 11/19/21 1534 11/21/21 2113 11/22/21 2140  GLUCAP 117* 118* 103* 131* 158*   D-Dimer No results for input(s): "DDIMER" in the last 72 hours. Hgb A1c No results for input(s): "HGBA1C" in the last 72 hours. Lipid Profile No results for input(s): "CHOL", "HDL", "LDLCALC", "TRIG", "CHOLHDL", "LDLDIRECT" in the last 72 hours. Thyroid function studies No results for input(s): "TSH", "T4TOTAL", "T3FREE", "THYROIDAB" in the last 72 hours.  Invalid input(s): "FREET3" Anemia work up No results for input(s): "VITAMINB12", "FOLATE", "FERRITIN", "TIBC", "IRON", "RETICCTPCT" in the last 72 hours. Urinalysis    Component Value Date/Time   COLORURINE AMBER (A) 11/18/2021 0516   APPEARANCEUR CLOUDY (A) 11/18/2021 0516   LABSPEC 1.028 11/18/2021 0516   PHURINE 5.0 11/18/2021 0516   GLUCOSEU NEGATIVE 11/18/2021 0516   HGBUR NEGATIVE 11/18/2021 0516   BILIRUBINUR NEGATIVE 11/18/2021 0516   KETONESUR NEGATIVE 11/18/2021 0516   PROTEINUR 100 (A) 11/18/2021 0516   UROBILINOGEN 0.2 10/18/2012 1944   NITRITE NEGATIVE 11/18/2021 0516    LEUKOCYTESUR NEGATIVE 11/18/2021 0516   Sepsis Labs Recent Labs  Lab 11/20/21 0336 11/21/21 0437 11/22/21 0824 11/23/21 0353  WBC 11.7* 13.9* 11.2* 12.3*   Microbiology Recent Results (from the past 240 hour(s))  Resp Panel by RT-PCR (Flu A&B, Covid) Anterior Nasal Swab     Status: None   Collection Time: 11/18/21  5:16 AM   Specimen: Anterior Nasal Swab  Result Value Ref Range Status   SARS Coronavirus 2 by RT PCR NEGATIVE NEGATIVE Final    Comment: (NOTE) SARS-CoV-2 target nucleic acids are NOT DETECTED.  The SARS-CoV-2 RNA is generally detectable in upper respiratory specimens during the acute phase of infection. The lowest concentration of SARS-CoV-2 viral copies this assay can detect is 138 copies/mL. A negative result does not preclude SARS-Cov-2 infection and should not be used as the sole basis for treatment or other patient management decisions. A negative result may occur with  improper specimen collection/handling, submission of specimen other than nasopharyngeal swab, presence of viral mutation(s) within the areas targeted by this assay, and inadequate number of viral copies(<138 copies/mL). A negative result must be combined with clinical observations, patient history, and epidemiological information. The expected result is Negative.  Fact Sheet for Patients:  BloggerCourse.com  Fact Sheet for Healthcare Providers:  SeriousBroker.it  This test is no t yet approved or cleared by the [place] FDA and  has been authorized for detection and/or diagnosis of SARS-CoV-2 by FDA under an Emergency  Use Authorization (EUA). This EUA will remain  in effect (meaning this test can be used) for the duration of the COVID-19 declaration under Section 564(b)(1) of the Act, 21 U.S.C.section 360bbb-3(b)(1), unless the authorization is terminated  or revoked sooner.       Influenza A by PCR NEGATIVE NEGATIVE Final    Influenza B by PCR NEGATIVE NEGATIVE Final    Comment: (NOTE) The Xpert Xpress SARS-CoV-2/FLU/RSV plus assay is intended as an aid in the diagnosis of influenza from Nasopharyngeal swab specimens and should not be used as a sole basis for treatment. Nasal washings and aspirates are unacceptable for Xpert Xpress SARS-CoV-2/FLU/RSV testing.  Fact Sheet for Patients: BloggerCourse.com  Fact Sheet for Healthcare Providers: SeriousBroker.it  This test is not yet approved or cleared by the [place] FDA and has been authorized for detection and/or diagnosis of SARS-CoV-2 by FDA under an Emergency Use Authorization (EUA). This EUA will remain in effect (meaning this test can be used) for the duration of the COVID-19 declaration under Section 564(b)(1) of the Act, 21 U.S.C. section 360bbb-3(b)(1), unless the authorization is terminated or revoked.  Performed at [facility], [address], [place], [place] [address]   Urine Culture     Status: None   Collection Time: 11/18/21  5:16 AM   Specimen: In/Out Cath Urine  Result Value Ref Range Status   Specimen Description   Final    IN/OUT CATH URINE Performed at [facility], [address], [place], [place] [address]    Special Requests   Final    NONE Performed at [facility], [address], [place], [place] [address]    Culture   Final    NO GROWTH Performed at [facility], [name], [place], [place] [address]    Report Status 11/19/2021 FINAL  Final  Blood Culture (routine x 2)     Status: None   Collection Time: 11/18/21  5:51 AM   Specimen: BLOOD  Result Value Ref Range Status   Specimen Description BLOOD BLOOD RIGHT HAND  Final   Special Requests   Final    BOTTLES DRAWN AEROBIC AND ANAEROBIC Blood Culture adequate volume BLOOD RIGHT HAND   Culture   Final    NO GROWTH 5 DAYS Performed at [facility], [address] [place], [place] [address]    Report  Status 11/23/2021 FINAL  Final  Blood Culture (routine x 2)     Status: None   Collection Time: 11/18/21  5:51 AM   Specimen: BLOOD  Result Value Ref Range Status   Specimen Description BLOOD BLOOD LEFT HAND  Final   Special Requests   Final    BOTTLES DRAWN AEROBIC AND ANAEROBIC Blood Culture adequate volume BLOOD LEFT HAND   Culture   Final    NO GROWTH 5 DAYS Performed at [facility], [address], [place], [place] [address]    Report Status 11/23/2021 FINAL  Final  MRSA Next Gen by PCR, Nasal     Status: None   Collection Time: 11/18/21  9:29 AM   Specimen: Nasal Mucosa; Nasal Swab  Result Value Ref Range Status   MRSA by PCR Next Gen NOT DETECTED NOT DETECTED Final    Comment: (NOTE) The GeneXpert MRSA Assay (FDA approved for NASAL specimens only), is one component of a comprehensive MRSA colonization surveillance program. It is not intended to diagnose MRSA infection nor to guide or monitor treatment for MRSA infections. Test performance is not FDA approved  in patients less than 76 years old. Performed at [facility], [address], [place], [place] [address]      Time coordinating discharge: 35 minutes  SIGNED:   Erick Blinks, DO [facility] 11/23/2021, 11:01 AM  If 7PM-7AM, please contact night-coverage [email or web address]

## 2021-11-23 NOTE — TOC Transition Note (Signed)
Transition of Care [facility]) - CM/SW Discharge Note   Patient Details  Name: Beverly [name] MRN: [id number] Date of Birth: 1932/06/30  Transition of Care [facility]) CM/SW Contact:  Annice Needy, LCSW Phone Number: 11/23/2021, 3:56 PM   Clinical Narrative:    Discharge clinicals sent to facility. Nurse to call report. TOC signing off.    Final next level of care: Skilled Nursing Facility Barriers to Discharge: No Barriers Identified   Patient Goals and CMS Choice Patient states their goals for this hospitalization and ongoing recovery are:: rehab then home      Discharge Placement              Patient chooses bed at: [facility] Patient to be transferred to facility by: staff Name of family member notified: daughter, [name] Patient and family notified of of transfer: 11/23/21  Discharge Plan and Services                                     Social Determinants of Health (SDOH) Interventions     Readmission Risk Interventions    05/04/2020   11:46 AM  Readmission Risk Prevention Plan  Transportation Screening Complete  Home Care Screening Complete  Medication Review (RN CM) Complete

## 2021-11-23 NOTE — Progress Notes (Signed)
Patient discharged to [facility],report called and given to [facility] LPN. IV discontinued catheter intact. Accompanied by staff to awaiting facility.

## 2021-11-23 NOTE — Care Management Important Message (Addendum)
Important Message  Patient Details  Name: Beverly [name] MRN: [id number] Date of Birth: [birth year]   Medicare Important Message Given:  Yes Spoke with daughter [name] at [phone] to review letter    Corey Harold 11/23/2021, 11:29 AM

## 2021-11-23 NOTE — Plan of Care (Signed)
?  Problem: Nutrition: ?Goal: Adequate nutrition will be maintained ?Outcome: Not Progressing ?  ?Problem: Safety: ?Goal: Ability to remain free from injury will improve ?Outcome: Not Progressing ?  ?Problem: Skin Integrity: ?Goal: Risk for impaired skin integrity will decrease ?Outcome: Not Progressing ?  ?

## 2021-11-24 ENCOUNTER — Other Ambulatory Visit: Payer: Self-pay | Admitting: *Deleted

## 2021-11-24 ENCOUNTER — Encounter: Payer: Self-pay | Admitting: Adult Health

## 2021-11-24 ENCOUNTER — Non-Acute Institutional Stay (SKILLED_NURSING_FACILITY): Payer: Medicare Other | Admitting: Adult Health

## 2021-11-24 DIAGNOSIS — G40109 Localization-related (focal) (partial) symptomatic epilepsy and epileptic syndromes with simple partial seizures, not intractable, without status epilepticus: Secondary | ICD-10-CM | POA: Diagnosis not present

## 2021-11-24 DIAGNOSIS — H40053 Ocular hypertension, bilateral: Secondary | ICD-10-CM

## 2021-11-24 DIAGNOSIS — I700 Atherosclerosis of aorta: Secondary | ICD-10-CM | POA: Diagnosis not present

## 2021-11-24 DIAGNOSIS — J42 Unspecified chronic bronchitis: Secondary | ICD-10-CM

## 2021-11-24 DIAGNOSIS — I10 Essential (primary) hypertension: Secondary | ICD-10-CM | POA: Diagnosis not present

## 2021-11-24 DIAGNOSIS — F339 Major depressive disorder, recurrent, unspecified: Secondary | ICD-10-CM

## 2021-11-24 DIAGNOSIS — E785 Hyperlipidemia, unspecified: Secondary | ICD-10-CM

## 2021-11-24 DIAGNOSIS — G3184 Mild cognitive impairment, so stated: Secondary | ICD-10-CM | POA: Diagnosis not present

## 2021-11-24 DIAGNOSIS — A419 Sepsis, unspecified organism: Secondary | ICD-10-CM

## 2021-11-24 DIAGNOSIS — M1990 Unspecified osteoarthritis, unspecified site: Secondary | ICD-10-CM | POA: Diagnosis not present

## 2021-11-24 DIAGNOSIS — I25119 Atherosclerotic heart disease of native coronary artery with unspecified angina pectoris: Secondary | ICD-10-CM | POA: Diagnosis not present

## 2021-11-24 DIAGNOSIS — R6521 Severe sepsis with septic shock: Secondary | ICD-10-CM

## 2021-11-24 DIAGNOSIS — K219 Gastro-esophageal reflux disease without esophagitis: Secondary | ICD-10-CM

## 2021-11-24 NOTE — Patient Outreach (Signed)
Per [facility] Mrs. Vesey recently admitted to [facility] SNF. Screening for potential [facility] care coordination services as benefit of insurance plan and PCP.  Communication sent to SNF social worker to make aware writer following for potential [facility] needs and transition plans.    Raiford Noble, MSN, RN,BSN [facility] Post Acute Care Coordinator [phone] (Direct dial)

## 2021-11-24 NOTE — Progress Notes (Signed)
Location:  [facility] Room Number: 132 Place of Service:  SNF (31)   CODE STATUS: dnr   Allergies  Allergen Reactions   Lisinopril Swelling   Eggs Or Egg-Derived Products Itching    Patient reports that she can eat eggs but does not tolerate egg derived products   Morphine And Related     Headache   Penicillins Itching    DID THE REACTION INVOLVE: Swelling of the face/tongue/throat, SOB, or low BP? No Sudden or severe rash/hives, skin peeling, or the inside of the mouth or nose? No Did it require medical treatment? No When did it last happen? Unknown If all above answers are "NO", may proceed with cephalosporin use.     Chief Complaint  Patient presents with   Hospitalization Follow-up    HPI:  She is a 88 year old woman who has been hospitalized from 11-18-21 through 11-23-21. Her medical history included: COPD: CAD: hypertension; arthritis; seizure post fall with TBI. She presented to the ED with altered mental status with hypotension in the setting of UTI. Her culture demonstrated no growth. She was admitted with septic shock secondary to presumed pneumonia and uti. She did require pressors in the icu. She was treated with abt and did respond well to therapy. She is here for short term rehab with her goal to return back home. She does complain of being fatigued and weak. There are no reports of fevers present. 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 STEMI with cardiac arrest (polymorphic VT) s/p DES to RCA.   GERD  (gastroesophageal reflux disease)    Headache(784.0)    Hearing loss, central    Left ear   Hematoma    Hypertension    Memory changes    Mild aortic stenosis    Mild carotid artery disease (HCC)    a. 1-39% by duplex 02/2018.   Seizures ([place]) 10/2012   No seizures, speech issues; after a fall, hitting head on left side   UTI (urinary tract infection)    June 2022    Past Surgical History:  Procedure Laterality Date   ABDOMINAL HYSTERECTOMY     APPENDECTOMY     CARDIAC CATHETERIZATION     15 yrs ago   CORONARY/GRAFT ACUTE MI REVASCULARIZATION N/A 01/10/2018   Procedure: Coronary/Graft Acute MI Revascularization;  Surgeon: Lorretta Harp, MD;  Location: [facility];  Service: Cardiovascular;  Laterality: N/A;   CRANIOTOMY Left 10/30/2012   Procedure: CRANIOTOMY HEMATOMA EVACUATION SUBDURAL;  Surgeon: Erline Levine, MD;  Location: [facility];  Service: Neurosurgery;  Laterality: Left;  Left Craniotomy for evacuation of subdural hematoma   JOINT REPLACEMENT Bilateral    knees   LEFT HEART CATH AND CORONARY ANGIOGRAPHY N/A 01/10/2018   Procedure: LEFT HEART CATH AND CORONARY ANGIOGRAPHY;  Surgeon: Lorretta Harp, MD;  Location: [facility];  Service: Cardiovascular;  Laterality: N/A;    Social History   Socioeconomic History   Marital status: Married    Spouse name: Not on file   Number of children: Not on file  Years of education: Not on file   Highest education level: Not on file  Occupational History   Not on file  Tobacco Use   Smoking status: Never   Smokeless tobacco: Never  Vaping Use   Vaping Use: Never used  Substance and Sexual Activity   Alcohol use: No   Drug use: No   Sexual activity: Not on file  Other Topics Concern   Not on file  Social History Narrative   Not on file   Social Determinants of Health   Financial Resource Strain: Not on file  Food Insecurity: No Food Insecurity (11/18/2021)   Hunger Vital Sign    Worried About  Running Out of Food in the Last Year: Never true    Ran Out of Food in the Last Year: Never true  Transportation Needs: No Transportation Needs (11/18/2021)   PRAPARE - Administrator, Civil Service (Medical): No    Lack of Transportation (Non-Medical): No  Physical Activity: Not on file  Stress: Not on file  Social Connections: Not on file  Intimate Partner Violence: Not At Risk (11/18/2021)   Humiliation, Afraid, Rape, and Kick questionnaire    Fear of Current or Ex-Partner: No    Emotionally Abused: No    Physically Abused: No    Sexually Abused: No   Family History  Problem Relation Age of Onset   Dementia Mother    Cancer Father    Dementia Maternal Aunt    Ataxia Neg Hx    Chorea Neg Hx    Mental retardation Neg Hx    Migraines Neg Hx    Multiple sclerosis Neg Hx    Neurofibromatosis Neg Hx    Neuropathy Neg Hx    Parkinsonism Neg Hx    Seizures Neg Hx    Stroke Neg Hx       VITAL SIGNS BP (!) 148/80   Pulse 100   Temp (!) 97.4 F (36.3 C)   Resp 20   Ht 5' (1.524 m)   Wt 146 lb 14.4 oz (66.6 kg)   SpO2 93%   BMI 28.69 kg/m   Outpatient Encounter Medications as of 11/24/2021  Medication Sig   acetaminophen (TYLENOL) 500 MG tablet Take 1 tablet (500 mg total) by mouth as needed (no more than 3 gm / day).   ALPRAZolam (XANAX) 0.5 MG tablet Take 1 tablet (0.5 mg total) by mouth 3 (three) times daily as needed for anxiety.   amLODipine (NORVASC) 5 MG tablet Take 1 tablet (5 mg total) by mouth daily.   aspirin 81 MG chewable tablet Chew 1 tablet (81 mg total) by mouth daily.   atorvastatin (LIPITOR) 80 MG tablet Take 1 tablet (80 mg total) by mouth daily at 6 PM.   carvedilol (COREG) 6.25 MG tablet TAKE ONE TABLET (6.25MG  TOTAL) BY MOUTH TWO TIMES DAILY   celecoxib (CELEBREX) 200 MG capsule Take 200 mg by mouth daily.   cholecalciferol (VITAMIN D) 1000 UNITS tablet Take 1,000 Units by mouth daily.    clotrimazole (MYCELEX) 10 MG troche Take 1  tablet (10 mg total) by mouth 5 (five) times daily. (Patient not taking: Reported on 11/18/2021)   dicyclomine (BENTYL) 20 MG tablet Take 20 mg by mouth in the morning and at bedtime. (Patient not taking: Reported on 11/18/2021)   guaiFENesin (ROBITUSSIN) 100 MG/5ML liquid Take 5 mLs by mouth every 4 (four) hours as needed for cough or to loosen phlegm.   HYDROcodone-acetaminophen (NORCO) 10-325 MG tablet  Take 1 tablet by mouth every 6 (six) hours as needed.   latanoprost (XALATAN) 0.005 % ophthalmic solution INSTILL ONE DROP IN BOTH EYES NIGHTLY   levETIRAcetam (KEPPRA) 500 MG tablet Take 500 mg by mouth 2 (two) times daily.   memantine (NAMENDA) 5 MG tablet Take 5 mg by mouth 2 (two) times daily.   nitroGLYCERIN (NITROSTAT) 0.4 MG SL tablet Place 1 tablet (0.4 mg total) under the tongue every 5 (five) minutes x 3 doses as needed for chest pain.   pantoprazole (PROTONIX) 40 MG tablet Take 40 mg by mouth daily.   Propylene Glycol (SYSTANE BALANCE OP) Place 1-2 drops into both eyes daily as needed (dry eyes).   risperiDONE (RISPERDAL) 0.5 MG tablet Take 0.5 mg by mouth 2 (two) times daily.   [DISCONTINUED] lisinopril (ZESTRIL) 20 MG tablet Take 1 tablet (20 mg total) by mouth 2 (two) times daily.   No facility-administered encounter medications on file as of 11/24/2021.     SIGNIFICANT DIAGNOSTIC EXAMS  TODAY  11-19-10: chest x-ray:  IMPRESSION: 1. Limited examination demonstrating low lung volumes with probable left left basilar subsegmental atelectasis and small left pleural effusion.  LABS REVIEWED TODAY;   11-18-21: wbc 22; hgb 11.4; hct 36.0; mcv 93.0 plt 259; glucose 161; bun 50; creat 184; k+ 4.9; na++ 133; ca 9.2; gfr 26; protein 6.5; albumin 2.9 urine and blood culture: no growth 11-20-21; wbc 11.7; hgb 10.7; hct 32.5; mcv 90.5 plt 245 glucose 101; bun 21; creat 0.57; k+ 3.1; na++ 137; ca 8.5; gfr >60; mag 1.8 11-23-21: wbc 12.3; hgb 10.7; hct 32.5; mcv 88.6 plt 269; glucose 111;  bun 7; creat 0.41; k+ 3.1; na++ 132; ca 7.9; gfr >60; mag 1.7.   Review of Systems  Constitutional:  Positive for malaise/fatigue.  Respiratory:  Negative for cough and shortness of breath.   Cardiovascular:  Negative for chest pain, palpitations and leg swelling.  Gastrointestinal:  Negative for abdominal pain, constipation and heartburn.  Musculoskeletal:  Negative for back pain, joint pain and myalgias.  Skin: Negative.   Neurological:  Positive for weakness. Negative for dizziness.  Psychiatric/Behavioral:  The patient is not nervous/anxious.    Physical Exam Constitutional:      General: She is not in acute distress.    Appearance: She is well-developed. She is not diaphoretic.  Neck:     Thyroid: No thyromegaly.  Cardiovascular:     Rate and Rhythm: Normal rate and regular rhythm.     Pulses: Normal pulses.     Heart sounds: Normal heart sounds.  Pulmonary:     Effort: Pulmonary effort is normal. No respiratory distress.     Breath sounds: Normal breath sounds.  Abdominal:     General: Bowel sounds are normal. There is no distension.     Palpations: Abdomen is soft.     Tenderness: There is no abdominal tenderness.  Musculoskeletal:        General: Normal range of motion.     Cervical back: Neck supple.     Right lower leg: No edema.     Left lower leg: No edema.     Comments: Contracture to neck with head leaning to right; kyphosis bilateral hands with arthritic changes   Lymphadenopathy:     Cervical: No cervical adenopathy.  Skin:    General: Skin is warm and dry.  Neurological:     Mental Status: She is alert and oriented to person, place, and time.  Psychiatric:  Mood and Affect: Mood normal.       ASSESSMENT/ PLAN:  TODAY  Septic shock: she was treated for presumed uti and pneumonia; however the chest x-ray and cultures did not demonstrate infection. She did respond to the broad abt given. Will monitor   2. Primary hypertension:b/p 148/80: will  continue norvasc 5 mg daily; asa 81 mg daily; coreg 25 mg twice daily   3. Chronic bronchitis unspecified chronic bronchitis type: will monitor   4. 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 Dyslipidemia: will continue lipitor 80 mg daily   6. Aortic atherosclerosis (05-08-20 ct): is on statin and asa   7. Arthritis: will continue celebrex 200 mg daily has vicodin 10/325 mg every 6 hours as needed  8. Increased intraocular pressure; bilateral: will continue xalatan to both eyes   9. GERD without esophagitis: will continue protonix 40 mg daily and take bentyl 20 mg twice daily   10. MCI (mild cognitive impairment) SLUMS pending  11. Major depression recurrent chronic: will continue risperdal 0.5 mg twice daily and has xanax 0.5 mg three times daily as needed  12. 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

## 2021-11-26 ENCOUNTER — Non-Acute Institutional Stay (SKILLED_NURSING_FACILITY): Payer: Medicare Other | Admitting: Internal Medicine

## 2021-11-26 ENCOUNTER — Encounter: Payer: Self-pay | Admitting: Internal Medicine

## 2021-11-26 DIAGNOSIS — I10 Essential (primary) hypertension: Secondary | ICD-10-CM

## 2021-11-26 DIAGNOSIS — R6521 Severe sepsis with septic shock: Secondary | ICD-10-CM

## 2021-11-26 DIAGNOSIS — E876 Hypokalemia: Secondary | ICD-10-CM

## 2021-11-26 DIAGNOSIS — N179 Acute kidney failure, unspecified: Secondary | ICD-10-CM | POA: Insufficient documentation

## 2021-11-26 DIAGNOSIS — A419 Sepsis, unspecified organism: Secondary | ICD-10-CM | POA: Diagnosis not present

## 2021-11-26 DIAGNOSIS — D649 Anemia, unspecified: Secondary | ICD-10-CM | POA: Diagnosis not present

## 2021-11-26 NOTE — Assessment & Plan Note (Signed)
Discussed repletion with [facility] NP. Avoid thiazide diuretics.

## 2021-11-26 NOTE — Patient Instructions (Signed)
See assessment and plan under each diagnosis in the problem list and acutely for this visit 

## 2021-11-26 NOTE — Assessment & Plan Note (Addendum)
Med List reviewed; no indication for changes in medications based on present renal function.

## 2021-11-26 NOTE — Assessment & Plan Note (Signed)
Titrate Coreg to control P & BP.

## 2021-11-26 NOTE — Progress Notes (Signed)
NURSING HOME LOCATION:  [facility] ROOM NUMBER:  132 P  CODE STATUS: DNR    PCP:  Benita Stabile MD  This is a comprehensive admission note to this SNFperformed on this date less than 30 days from date of admission. Included are preadmission medical/surgical history; reconciled medication list; family history; social history and comprehensive review of systems.  Corrections and additions to the records were documented. Comprehensive physical exam was also performed. Additionally a clinical summary was entered for each active diagnosis pertinent to this admission in the Problem List to enhance continuity of care.  HPI: She was hospitalized 11/16 - 11/23/2021 presenting with AMS in the context of hypotension.  She was in septic shock presumed etiology is to be possible CAP or UTI.  Lactic acid peak was 3.2.  White count was 22,000.  Mild anemia was present with H/H of 11.4/36.  Norepinephrine and vasopressin were initiated for the hypotension.  These were weaned  after blood pressure stabilized.  Rocephin and azithromycin were administered. Although UTI was suspected an in & out cath culture revealed no growth.  Chest x-ray revealed bilateral small effusions and bibasilar atelectasis but no definite infiltrate.  Course was complicated by AKI with creatinine of 1.84 and GFR of 26. With aggressive treatment of the sepsis there were improvement in all parameters.  Predischarge white count was 12,300 and creatinine 0.41 with a GFR greater than 60. There was some progression of anemia with final H/H of 10.9/33.5.  Final white count was 12,300.  At discharge she was hypokalemic with a potassium of 3.1. PT/OT consulted and recommended SNF placement because of debilitation.  Past medical and surgical history: Includes history of degenerative joint disease, COPD, CAD, GERD, essential hypertension, aortic stenosis, & history of seizures,. Surgeries and procedures include abdominal  hysterectomy, appendectomy, cardiac catheterization, coronary artery revascularization, & craniotomy for hematoma,.  Social history: Nondrinker, non-smoker.  She is a retired Engineer, civil (consulting).  Family history: Noncontributory due to advanced age.   Review of systems: She validates she had she remains very weak.  She has diffuse arthritic pain mainly in her shoulders and the entire spine.  She is cognizant that she was diagnosed with possible UTI or pneumonia.  She describes occasional cough and occasional dysphagia especially with pills.  She also validates dyspepsia. She has been constipated on supplemental iron.  She has chronic anxiety and depression.  Constitutional: No fever, significant weight change Eyes: No redness, discharge, pain, vision change ENT/mouth: No nasal congestion, purulent discharge, earache, change in hearing, sore throat  Cardiovascular: No chest pain, palpitations, paroxysmal nocturnal dyspnea, claudication, edema  Respiratory: No sputum production, hemoptysis, DOE, significant snoring, apnea Gastrointestinal: No abdominal pain, nausea /vomiting, rectal bleeding, melena Genitourinary: No dysuria, hematuria, pyuria, incontinence, nocturia Dermatologic: No rash, pruritus, change in appearance of skin Neurologic: No dizziness, headache, syncope, seizures, numbness, tingling Psychiatric: No significant insomnia, anorexia Endocrine: No change in hair/skin/nails, excessive thirst, excessive hunger, excessive urination  Hematologic/lymphatic: No significant bruising, lymphadenopathy, abnormal bleeding Allergy/immunology: No itchy/watery eyes, significant sneezing, urticaria, angioedema  Physical exam:  Pertinent or positive findings: She is hard of hearing.  Her head and neck are flexed to the right.  Facies are sad.  She appears chronically ill. Eyebrows are thin.  She has bilateral ptosis.  There is slight asymmetry of the nasolabial folds.  She exhibits a slight tachycardia with a  grade 1 systolic murmur which is brisk.  She has dry rales at the bases.  Breath sounds  are decreased overall.  Abdomen is protuberant.  She has trace edema at the sock line.  Pedal pulses are decreased.  Contractures of the fingers and lateral deviation of hands are present. MCP joints are prominent. Interosseous wasting of the hands Is present.  There is scattered bruising over the distal upper extremities.  Limb atrophy is also present.  General appearance: no acute distress, increased work of breathing is present.   Lymphatic: No lymphadenopathy about the head, neck, axilla. Eyes: No conjunctival inflammation or lid edema is present. There is no scleral icterus. Ears:  External ear exam shows no significant lesions or deformities.   Nose:  External nasal examination shows no deformity or inflammation. Nasal mucosa are pink and moist without lesions, exudates Oral exam: Lips and gums are healthy appearing.There is no oropharyngeal erythema or exudate. Neck:  No thyromegaly, masses, tenderness noted.    Heart:  No gallop, click, rub.  Lungs: without wheezes, rhonchi,  rubs. Abdomen: Bowel sounds are normal.  Abdomen is soft and nontender with no organomegaly, hernias, masses. GU: Deferred  Extremities:  No cyanosis, clubbing. Neurologic exam:  Balance, Rhomberg, finger to nose testing could not be completed due to clinical state Skin: Warm & dry w/o tenting. No significant lesions or rash.  See clinical summary under each active problem in the Problem List with associated updated therapeutic plan

## 2021-11-26 NOTE — Assessment & Plan Note (Addendum)
H/H dropped from presentation values of 11.4/36 down to 10.9/33.5 at discharge.  This is most likely related to rehydration and multiple blood draws.  No bleeding dyscrasias reported at the SNF.  Continue to monitor.

## 2021-11-26 NOTE — Assessment & Plan Note (Addendum)
Although no definite etiology of the septic shock was confirmed she is clinically improved although she exhibits slight tachycardia. BP now in hypertensive range.& will be addressed

## 2021-11-29 ENCOUNTER — Other Ambulatory Visit (HOSPITAL_COMMUNITY)
Admission: RE | Admit: 2021-11-29 | Discharge: 2021-11-29 | Disposition: A | Discharge status: Home / Self Care | Payer: Medicare Other | Source: Skilled Nursing Facility | Attending: Adult Health | Admitting: Adult Health

## 2021-11-29 ENCOUNTER — Encounter: Payer: Self-pay | Admitting: Adult Health

## 2021-11-29 ENCOUNTER — Non-Acute Institutional Stay (SKILLED_NURSING_FACILITY): Payer: Medicare Other | Admitting: Adult Health

## 2021-11-29 DIAGNOSIS — E876 Hypokalemia: Secondary | ICD-10-CM | POA: Diagnosis not present

## 2021-11-29 DIAGNOSIS — A419 Sepsis, unspecified organism: Secondary | ICD-10-CM | POA: Insufficient documentation

## 2021-11-29 DIAGNOSIS — E871 Hypo-osmolality and hyponatremia: Secondary | ICD-10-CM

## 2021-11-29 LAB — CBC
HCT: 33.9 % — ABNORMAL LOW (ref 36.0–46.0)
Hemoglobin: 11.3 g/dL — ABNORMAL LOW (ref 12.0–15.0)
MCH: 29.6 pg (ref 26.0–34.0)
MCHC: 33.3 g/dL (ref 30.0–36.0)
MCV: 88.7 fL (ref 80.0–100.0)
Platelets: 634 10*3/uL — ABNORMAL HIGH (ref 150–400)
RBC: 3.82 MIL/uL — ABNORMAL LOW (ref 3.87–5.11)
RDW: 16.6 % — ABNORMAL HIGH (ref 11.5–15.5)
WBC: 11.1 10*3/uL — ABNORMAL HIGH (ref 4.0–10.5)
nRBC: 0 % (ref 0.0–0.2)

## 2021-11-29 LAB — BASIC METABOLIC PANEL
Anion gap: 8 (ref 5–15)
BUN: 6 mg/dL — ABNORMAL LOW (ref 8–23)
CO2: 27 mmol/L (ref 22–32)
Calcium: 8.4 mg/dL — ABNORMAL LOW (ref 8.9–10.3)
Chloride: 96 mmol/L — ABNORMAL LOW (ref 98–111)
Creatinine, Ser: 0.38 mg/dL — ABNORMAL LOW (ref 0.44–1.00)
GFR, Estimated: >60 mL/min (ref >60)
Glucose, Bld: 111 mg/dL — ABNORMAL HIGH (ref 70–99)
Potassium: 3 mmol/L — ABNORMAL LOW (ref 3.5–5.1)
Sodium: 131 mmol/L — ABNORMAL LOW (ref 135–145)

## 2021-11-29 NOTE — Progress Notes (Unsigned)
Location:  [facility] Room Number: 132 Place of Service:  SNF (31)   CODE STATUS: full   Allergies  Allergen Reactions   Lisinopril Swelling   Eggs Or Egg-Derived Products Itching    Patient reports that she can eat eggs but does not tolerate egg derived products   Morphine And Related     Headache   Penicillins Itching    DID THE REACTION INVOLVE: Swelling of the face/tongue/throat, SOB, or low BP? No Sudden or severe rash/hives, skin peeling, or the inside of the mouth or nose? No Did it require medical treatment? No When did it last happen? Unknown If all above answers are "NO", may proceed with cephalosporin use.     Chief Complaint  Patient presents with   Acute Visit    Hypertension     HPI:  Her blood pressure readings are presently stable. Her sodium and potassium levels are low. There are no reports of pain; no shortness of breath; no changes in vision.   Past Medical History:  Diagnosis Date   Anxiety    Arthritis    COPD (chronic obstructive pulmonary disease) (HCC)    Coronary artery disease    a. STEMI with cardiac arrest (polymorphic VT) s/p DES to RCA.   GERD (gastroesophageal reflux disease)    Headache(784.0)    Hearing loss, central    Left ear   Hematoma    Hypertension    Memory changes    Mild aortic stenosis    Mild carotid artery disease (HCC)    a. 1-39% by duplex 02/2018.   Seizures (HCC) 10/2012   No seizures, speech issues; after a fall, hitting head on left side   UTI (urinary tract infection)    June 2022    Past Surgical History:  Procedure Laterality Date   ABDOMINAL HYSTERECTOMY     APPENDECTOMY     CARDIAC CATHETERIZATION     15 yrs ago   CORONARY/GRAFT ACUTE MI REVASCULARIZATION N/A 01/10/2018   Procedure: Coronary/Graft Acute MI Revascularization;  Surgeon: Runell Gess, MD;  [facility] IN[facility];  Service: Cardiovascular;  Laterality: N/A;   CRANIOTOMY Left 10/30/2012   Procedure:  CRANIOTOMY HEMATOMA EVACUATION SUBDURAL;  Surgeon: Maeola Harman, MD;  Location: [facility] NEURO ORS;  Service: Neurosurgery;  Laterality: Left;  Left Craniotomy for evacuation of subdural hematoma   JOINT REPLACEMENT Bilateral    knees   LEFT HEART CATH AND CORONARY ANGIOGRAPHY N/A 01/10/2018   Procedure: LEFT HEART CATH AND CORONARY ANGIOGRAPHY;  Surgeon: Runell Gess, MD;  Location: [facility] INVASIVE CV LAB;  Service: Cardiovascular;  Laterality: N/A;    Social History   Socioeconomic History   Marital status: Married    Spouse name: Not on file   Number of children: Not on file   Years of education: Not on file   Highest education level: Not on file  Occupational History   Not on file  Tobacco Use   Smoking status: Never   Smokeless tobacco: Never  Vaping Use   Vaping Use: Never used  Substance and Sexual Activity   Alcohol use: No   Drug use: No   Sexual activity: Not on file  Other Topics Concern   Not on file  Social History Narrative   Not on file   Social Determinants of Health   Financial Resource Strain: Not on file  Food Insecurity: No Food Insecurity (11/18/2021)   Hunger Vital Sign    Worried About  Running Out of Food in the Last Year: Never true    Ran Out of Food in the Last Year: Never true  Transportation Needs: No Transportation Needs (11/18/2021)   PRAPARE - Administrator, Civil Service (Medical): No    Lack of Transportation (Non-Medical): No  Physical Activity: Not on file  Stress: Not on file  Social Connections: Not on file  Intimate Partner Violence: Not At Risk (11/18/2021)   Humiliation, Afraid, Rape, and Kick questionnaire    Fear of Current or Ex-Partner: No    Emotionally Abused: No    Physically Abused: No    Sexually Abused: No   Family History  Problem Relation Age of Onset   Dementia Mother    Cancer Father    Dementia Maternal Aunt    Ataxia Neg Hx    Chorea Neg Hx    Mental retardation Neg Hx    Migraines Neg Hx     Multiple sclerosis Neg Hx    Neurofibromatosis Neg Hx    Neuropathy Neg Hx    Parkinsonism Neg Hx    Seizures Neg Hx    Stroke Neg Hx       VITAL SIGNS BP 124/76   Pulse 90   Temp 98.1 F (36.7 C)   Resp 18   Ht 5' (1.524 m)   Wt 141 lb 12.8 oz (64.3 kg)   SpO2 92%   BMI 27.69 kg/m   Outpatient Encounter Medications as of 11/29/2021  Medication Sig   acetaminophen (TYLENOL) 500 MG tablet Take 1 tablet (500 mg total) by mouth as needed (no more than 3 gm / day).   ALPRAZolam (XANAX) 0.5 MG tablet Take 1 tablet (0.5 mg total) by mouth 3 (three) times daily as needed for anxiety.   amLODipine (NORVASC) 5 MG tablet Take 1 tablet (5 mg total) by mouth daily.   aspirin 81 MG chewable tablet Chew 1 tablet (81 mg total) by mouth daily.   atorvastatin (LIPITOR) 80 MG tablet Take 1 tablet (80 mg total) by mouth daily at 6 PM.   carvedilol (COREG) 6.25 MG tablet TAKE ONE TABLET (6.25MG  TOTAL) BY MOUTH TWO TIMES DAILY   celecoxib (CELEBREX) 200 MG capsule Take 200 mg by mouth daily.   cholecalciferol (VITAMIN D) 1000 UNITS tablet Take 1,000 Units by mouth daily.    clotrimazole (MYCELEX) 10 MG troche Take 1 tablet (10 mg total) by mouth 5 (five) times daily. (Patient not taking: Reported on 11/18/2021)   dicyclomine (BENTYL) 20 MG tablet Take 20 mg by mouth in the morning and at bedtime. (Patient not taking: Reported on 11/18/2021)   guaiFENesin (ROBITUSSIN) 100 MG/5ML liquid Take 5 mLs by mouth every 4 (four) hours as needed for cough or to loosen phlegm.   HYDROcodone-acetaminophen (NORCO) 10-325 MG tablet Take 1 tablet by mouth every 6 (six) hours as needed.   latanoprost (XALATAN) 0.005 % ophthalmic solution INSTILL ONE DROP IN BOTH EYES NIGHTLY   levETIRAcetam (KEPPRA) 500 MG tablet Take 500 mg by mouth 2 (two) times daily.   memantine (NAMENDA) 5 MG tablet Take 5 mg by mouth 2 (two) times daily.   nitroGLYCERIN (NITROSTAT) 0.4 MG SL tablet Place 1 tablet (0.4 mg total) under the  tongue every 5 (five) minutes x 3 doses as needed for chest pain.   pantoprazole (PROTONIX) 40 MG tablet Take 40 mg by mouth daily.   Propylene Glycol (SYSTANE BALANCE OP) Place 1-2 drops into both eyes daily as needed (dry  eyes).   risperiDONE (RISPERDAL) 0.5 MG tablet Take 0.5 mg by mouth 2 (two) times daily.   [DISCONTINUED] lisinopril (ZESTRIL) 20 MG tablet Take 1 tablet (20 mg total) by mouth 2 (two) times daily.   No facility-administered encounter medications on file as of 11/29/2021.     SIGNIFICANT DIAGNOSTIC EXAMS  PREVIOUS   11-19-10: chest x-ray:  IMPRESSION: 1. Limited examination demonstrating low lung volumes with probable left left basilar subsegmental atelectasis and small left pleural effusion.  NO NEW EXAMS.   LABS REVIEWED PREVIOUS    11-18-21: wbc 22; hgb 11.4; hct 36.0; mcv 93.0 plt 259; glucose 161; bun 50; creat 184; k+ 4.9; na++ 133; ca 9.2; gfr 26; protein 6.5; albumin 2.9 urine and blood culture: no growth 11-20-21; wbc 11.7; hgb 10.7; hct 32.5; mcv 90.5 plt 245 glucose 101; bun 21; creat 0.57; k+ 3.1; na++ 137; ca 8.5; gfr >60; mag 1.8 11-23-21: wbc 12.3; hgb 10.7; hct 32.5; mcv 88.6 plt 269; glucose 111; bun 7; creat 0.41; k+ 3.1; na++ 132; ca 7.9; gfr >60; mag 1.7.   TODAY  11-29-21: wbc 11.1; hgb 11.3; hct 33.9; mcv 88.7 plt 634; glucose 111; bun 6; creat 0.38; k+ 3.0; na++ 131; ca 8.4; gfr >60   Review of Systems  Constitutional:  Negative for malaise/fatigue.  Respiratory:  Negative for cough and shortness of breath.   Cardiovascular:  Negative for chest pain, palpitations and leg swelling.  Gastrointestinal:  Negative for abdominal pain, constipation and heartburn.  Musculoskeletal:  Negative for back pain, joint pain and myalgias.  Skin: Negative.   Neurological:  Negative for dizziness.  Psychiatric/Behavioral:  The patient is not nervous/anxious.    Physical Exam Constitutional:      General: She is not in acute distress.    Appearance:  She is well-developed. She is not diaphoretic.  Neck:     Thyroid: No thyromegaly.  Cardiovascular:     Rate and Rhythm: Normal rate and regular rhythm.     Pulses: Normal pulses.     Heart sounds: Normal heart sounds.  Pulmonary:     Effort: Pulmonary effort is normal. No respiratory distress.     Breath sounds: Normal breath sounds.  Abdominal:     General: Bowel sounds are normal. There is no distension.     Palpations: Abdomen is soft.     Tenderness: There is no abdominal tenderness.  Musculoskeletal:     Cervical back: Neck supple.     Right lower leg: No edema.     Left lower leg: No edema.     Comments:  Contracture to neck with head leaning to right; kyphosis bilateral hands with arthritic changes    Lymphadenopathy:     Cervical: No cervical adenopathy.  Skin:    General: Skin is warm and dry.  Neurological:     Mental Status: She is alert and oriented to person, place, and time.  Psychiatric:        Mood and Affect: Mood normal.       ASSESSMENT/ PLAN:  TODAY  Hypokalemia Hyponatremia  Her na++ level continue to worsen will begin NACL 1 gm daily Will begin k+ 20 meq daily  Will stop celebrex due to inability to swallow whole Will repeat bmp on 12-02-21.    Synthia Innocent NP [facility]  call [phone]

## 2021-12-02 ENCOUNTER — Other Ambulatory Visit (HOSPITAL_COMMUNITY)
Admission: RE | Admit: 2021-12-02 | Discharge: 2021-12-02 | Disposition: A | Discharge status: Home / Self Care | Payer: Medicare Other | Source: Skilled Nursing Facility | Attending: Adult Health | Admitting: Adult Health

## 2021-12-02 ENCOUNTER — Other Ambulatory Visit: Payer: Self-pay | Admitting: *Deleted

## 2021-12-02 DIAGNOSIS — I10 Essential (primary) hypertension: Secondary | ICD-10-CM | POA: Insufficient documentation

## 2021-12-02 LAB — BASIC METABOLIC PANEL
Anion gap: 10 (ref 5–15)
BUN: 9 mg/dL (ref 8–23)
CO2: 26 mmol/L (ref 22–32)
Calcium: 8.7 mg/dL — ABNORMAL LOW (ref 8.9–10.3)
Chloride: 98 mmol/L (ref 98–111)
Creatinine, Ser: 0.52 mg/dL (ref 0.44–1.00)
GFR, Estimated: >60 mL/min (ref >60)
Glucose, Bld: 119 mg/dL — ABNORMAL HIGH (ref 70–99)
Potassium: 3.4 mmol/L — ABNORMAL LOW (ref 3.5–5.1)
Sodium: 134 mmol/L — ABNORMAL LOW (ref 135–145)

## 2021-12-02 NOTE — Patient Outreach (Signed)
[facility] Post-Acute Care Coordinator follow up. Mrs. Beverly [name] resides in [facility]. Screening for potential [facility] care coordination services as benefit of insurance plan and PCP.   Update received from [facility] social worker indicating Beverly [name] is doing very well with therapy. From home with children. Projected dc date is next week.   Will plan outreach to family to discuss potential [facility] needs.   Beverly Noble, MSN, RN,BSN [facility] Post Acute Care Coordinator [phone] (Direct dial)

## 2021-12-06 ENCOUNTER — Other Ambulatory Visit: Payer: Self-pay | Admitting: Adult Health

## 2021-12-06 MED ORDER — ALPRAZOLAM 0.5 MG PO TABS: 0.5000 mg | ORAL_TABLET | Freq: Every evening | ORAL | 0 refills | Status: DC | PRN

## 2021-12-08 ENCOUNTER — Non-Acute Institutional Stay (SKILLED_NURSING_FACILITY): Payer: Medicare Other | Admitting: Adult Health

## 2021-12-08 ENCOUNTER — Encounter: Payer: Self-pay | Admitting: Adult Health

## 2021-12-08 ENCOUNTER — Other Ambulatory Visit: Payer: Self-pay | Admitting: Adult Health

## 2021-12-08 DIAGNOSIS — F339 Major depressive disorder, recurrent, unspecified: Secondary | ICD-10-CM | POA: Diagnosis not present

## 2021-12-08 DIAGNOSIS — J42 Unspecified chronic bronchitis: Secondary | ICD-10-CM | POA: Diagnosis not present

## 2021-12-08 DIAGNOSIS — A419 Sepsis, unspecified organism: Secondary | ICD-10-CM | POA: Diagnosis not present

## 2021-12-08 MED ORDER — LEVETIRACETAM 500 MG PO TABS: 500.0000 mg | ORAL_TABLET | Freq: Two times a day (BID) | ORAL | 0 refills | Status: DC

## 2021-12-08 MED ORDER — NITROGLYCERIN 0.4 MG SL SUBL: 0.4000 mg | SUBLINGUAL_TABLET | SUBLINGUAL | 0 refills | Status: DC | PRN

## 2021-12-08 MED ORDER — ALPRAZOLAM 0.5 MG PO TABS: 0.5000 mg | ORAL_TABLET | Freq: Every evening | ORAL | 0 refills | Status: DC | PRN

## 2021-12-08 MED ORDER — OMEPRAZOLE 20 MG PO CPDR: 20.0000 mg | DELAYED_RELEASE_CAPSULE | Freq: Two times a day (BID) | ORAL | 0 refills | Status: DC

## 2021-12-08 MED ORDER — RISPERIDONE 0.5 MG PO TABS: 0.5000 mg | ORAL_TABLET | Freq: Two times a day (BID) | ORAL | 0 refills | Status: DC

## 2021-12-08 MED ORDER — MEMANTINE HCL 5 MG PO TABS: 5.0000 mg | ORAL_TABLET | Freq: Two times a day (BID) | ORAL | 0 refills | Status: DC

## 2021-12-08 MED ORDER — POTASSIUM CHLORIDE CRYS ER 20 MEQ PO TBCR: 20.0000 meq | EXTENDED_RELEASE_TABLET | Freq: Every day | ORAL | 0 refills | Status: DC

## 2021-12-08 MED ORDER — ATORVASTATIN CALCIUM 80 MG PO TABS: 80.0000 mg | ORAL_TABLET | Freq: Every day | ORAL | 0 refills | Status: DC

## 2021-12-08 MED ORDER — SODIUM CHLORIDE 1 G PO TABS: 1.0000 g | ORAL_TABLET | Freq: Every day | ORAL | 0 refills | Status: DC

## 2021-12-08 MED ORDER — AMLODIPINE BESYLATE 5 MG PO TABS: 5.0000 mg | ORAL_TABLET | Freq: Every day | ORAL | 0 refills | Status: DC

## 2021-12-08 MED ORDER — LATANOPROST 0.005 % OP SOLN: OPHTHALMIC | 0 refills | Status: DC

## 2021-12-08 MED ORDER — DICYCLOMINE HCL 20 MG PO TABS: 20.0000 mg | ORAL_TABLET | Freq: Two times a day (BID) | ORAL | 0 refills | Status: DC

## 2021-12-08 MED ORDER — CARVEDILOL 6.25 MG PO TABS: ORAL_TABLET | ORAL | 0 refills | Status: DC

## 2021-12-08 NOTE — Progress Notes (Signed)
Location:  [facility] Room Number: S132P Place of Service:  SNF (31)   CODE STATUS: DNR  Allergies  Allergen Reactions   Lisinopril Swelling   Eggs Or Egg-Derived Products Itching    Patient reports that she can eat eggs but does not tolerate egg derived products   Morphine And Related     Headache   Penicillins Itching    DID THE REACTION INVOLVE: Swelling of the face/tongue/throat, SOB, or low BP? No Sudden or severe rash/hives, skin peeling, or the inside of the mouth or nose? No Did it require medical treatment? No When did it last happen? Unknown If all above answers are "NO", may proceed with cephalosporin use.     Chief Complaint  Patient presents with   Discharge Note    Discharge    HPI:  She is being discharged to home with home health for pt/ot. She does not need any dme. She will need her prescriptions written and will need to follow up with her medical provider. She was admitted to the hospital for septic shock due to UTI. She was admitted to this facility for short term rehab. She is able to ambulate 50 feet; she can transfer with supervision. She is ready to be discharged home. She has some one with her 24 hours a day.   Past Medical History:  Diagnosis Date   Anxiety    Arthritis    COPD (chronic obstructive pulmonary disease) (HCC)    Coronary artery disease    a. STEMI with cardiac arrest (polymorphic VT) s/p DES to RCA.   GERD (gastroesophageal reflux disease)    Headache(784.0)    Hearing loss, central    Left ear   Hematoma    Hypertension    Memory changes    Mild aortic stenosis    Mild carotid artery disease (HCC)    a. 1-39% by duplex 02/2018.   Seizures (HCC) 10/2012   No seizures, speech issues; after a fall, hitting head on left side   UTI (urinary tract infection)    June 2022    Past Surgical History:  Procedure Laterality Date   ABDOMINAL HYSTERECTOMY     APPENDECTOMY     CARDIAC CATHETERIZATION     17  yrs ago   CORONARY/GRAFT ACUTE MI REVASCULARIZATION N/A 01/10/2018   Procedure: Coronary/Graft Acute MI Revascularization;  Surgeon: Runell Gess, MD;  [facility]: [facility];  Service: Cardiovascular;  Laterality: N/A;   CRANIOTOMY Left 10/30/2012   Procedure: CRANIOTOMY HEMATOMA EVACUATION SUBDURAL;  Surgeon: Maeola Harman, MD;  Location: [facility] NEUR[facility];  Service: Neurosurgery;  Laterality: Left;  Left Craniotomy for evacuation of subdural hematoma   JOINT REPLACEMENT Bilateral    knees   LEFT HEART CATH AND CORONARY ANGIOGRAPHY N/A 01/10/2018   Procedure: LEFT HEART CATH AND CORONARY ANGIOGRAPHY;  Surgeon: Runell Gess, MD;  [facility]: [facility];  Service: Cardiovascular;  Laterality: N/A;    Social History   Socioeconomic History   Marital status: Married    Spouse name: Not on file   Number of children: Not on file   Years of education: Not on file   Highest education level: Not on file  Occupational History   Not on file  Tobacco Use   Smoking status: Never   Smokeless tobacco: Never  Vaping Use   Vaping Use: Never used  Substance and Sexual Activity   Alcohol use: No   Drug use: No   Sexual  activity: Not on file  Other Topics Concern   Not on file  Social History Narrative   Not on file   Social Determinants of Health   Financial Resource Strain: Not on file  Food Insecurity: No Food Insecurity (11/18/2021)   Hunger Vital Sign    Worried About Running Out of Food in the Last Year: Never true    Ran Out of Food in the Last Year: Never true  Transportation Needs: No Transportation Needs (11/18/2021)   PRAPARE - Administrator, Civil Service (Medical): No    Lack of Transportation (Non-Medical): No  Physical Activity: Not on file  Stress: Not on file  Social Connections: Not on file  Intimate Partner Violence: Not At Risk (11/18/2021)   Humiliation, Afraid, Rape, and Kick questionnaire    Fear of Current or Ex-Partner: No     Emotionally Abused: No    Physically Abused: No    Sexually Abused: No   Family History  Problem Relation Age of Onset   Dementia Mother    Cancer Father    Dementia Maternal Aunt    Ataxia Neg Hx    Chorea Neg Hx    Mental retardation Neg Hx    Migraines Neg Hx    Multiple sclerosis Neg Hx    Neurofibromatosis Neg Hx    Neuropathy Neg Hx    Parkinsonism Neg Hx    Seizures Neg Hx    Stroke Neg Hx       VITAL SIGNS BP 136/86   Pulse 92   Temp 98.4 F (36.9 C)   Resp 18   Ht 5' (1.524 m)   Wt 137 lb 3.2 oz (62.2 kg)   SpO2 95%   BMI 26.80 kg/m   Outpatient Encounter Medications as of 12/08/2021  Medication Sig   acetaminophen (TYLENOL) 500 MG tablet Take 500 mg by mouth every 8 (eight) hours as needed.   ALPRAZolam (XANAX) 0.5 MG tablet Take 1 tablet (0.5 mg total) by mouth at bedtime as needed for anxiety.   amLODipine (NORVASC) 5 MG tablet Take 1 tablet (5 mg total) by mouth daily.   aspirin 81 MG chewable tablet Chew 1 tablet (81 mg total) by mouth daily.   atorvastatin (LIPITOR) 80 MG tablet Take 1 tablet (80 mg total) by mouth daily at 6 PM.   carvedilol (COREG) 6.25 MG tablet TAKE ONE TABLET (6.25MG  TOTAL) BY MOUTH TWO TIMES DAILY   dicyclomine (BENTYL) 20 MG tablet Take 1 tablet (20 mg total) by mouth in the morning and at bedtime.   guaiFENesin (ROBITUSSIN) 100 MG/5ML liquid Take 5 mLs by mouth every 4 (four) hours as needed for cough or to loosen phlegm.   latanoprost (XALATAN) 0.005 % ophthalmic solution INSTILL ONE DROP IN BOTH EYES NIGHTLY   levETIRAcetam (KEPPRA) 100 MG/ML solution Take 5 mLs by mouth 2 (two) times daily.   memantine (NAMENDA) 5 MG tablet Take 1 tablet (5 mg total) by mouth 2 (two) times daily.   nitroGLYCERIN (NITROSTAT) 0.4 MG SL tablet Place 1 tablet (0.4 mg total) under the tongue every 5 (five) minutes x 3 doses as needed for chest pain.   Nutritional Supplements (ENSURE ENLIVE PO) Take 237 mLs by mouth 2 (two) times daily.    omeprazole (PRILOSEC) 20 MG capsule Take 1 capsule (20 mg total) by mouth 2 (two) times daily before a meal.   potassium chloride SA (KLOR-CON M) 20 MEQ tablet Take 1 tablet (20 mEq total) by mouth  daily.   Propylene Glycol (SYSTANE BALANCE OP) Place 2 drops into both eyes daily as needed (dry eyes).   risperiDONE (RISPERDAL) 0.5 MG tablet Take 1 tablet (0.5 mg total) by mouth 2 (two) times daily.   sodium chloride 1 g tablet Take 1 tablet (1 g total) by mouth daily.   [DISCONTINUED] acetaminophen (TYLENOL) 500 MG tablet Take 1 tablet (500 mg total) by mouth as needed (no more than 3 gm / day). (Patient taking differently: Take 500 mg by mouth every 8 (eight) hours as needed (no more than 3 gm / day).)   [DISCONTINUED] cholecalciferol (VITAMIN D) 1000 UNITS tablet Take 1,000 Units by mouth daily.    [DISCONTINUED] levETIRAcetam (KEPPRA) 500 MG tablet Take 1 tablet (500 mg total) by mouth 2 (two) times daily.   [DISCONTINUED] lisinopril (ZESTRIL) 20 MG tablet Take 1 tablet (20 mg total) by mouth 2 (two) times daily.   No facility-administered encounter medications on file as of 12/08/2021.     SIGNIFICANT DIAGNOSTIC EXAMS   PREVIOUS   11-18-21: chest x-ray:  IMPRESSION: 1. Limited examination demonstrating low lung volumes with probable left left basilar subsegmental atelectasis and small left pleural effusion.  NO NEW EXAMS.   LABS REVIEWED PREVIOUS    11-18-21: wbc 22; hgb 11.4; hct 36.0; mcv 93.0 plt 259; glucose 161; bun 50; creat 184; k+ 4.9; na++ 133; ca 9.2; gfr 26; protein 6.5; albumin 2.9 urine and blood culture: no growth 11-20-21; wbc 11.7; hgb 10.7; hct 32.5; mcv 90.5 plt 245 glucose 101; bun 21; creat 0.57; k+ 3.1; na++ 137; ca 8.5; gfr >60; mag 1.8 11-23-21: wbc 12.3; hgb 10.7; hct 32.5; mcv 88.6 plt 269; glucose 111; bun 7; creat 0.41; k+ 3.1; na++ 132; ca 7.9; gfr >60; mag 1.7.  11-29-21: wbc 11.1; hgb 11.3; hct 33.9; mcv 88.7 plt 634; glucose 111; bun 6; creat 0.38; k+  3.0; na++ 131; ca 8.4; gfr >60  NO NEW LABS.    Review of Systems  Constitutional:  Negative for malaise/fatigue.  Respiratory:  Negative for cough and shortness of breath.   Cardiovascular:  Negative for chest pain, palpitations and leg swelling.  Gastrointestinal:  Negative for abdominal pain, constipation and heartburn.  Musculoskeletal:  Negative for back pain, joint pain and myalgias.  Skin: Negative.   Neurological:  Negative for dizziness.  Psychiatric/Behavioral:  The patient is not nervous/anxious.    Physical Exam Constitutional:      General: She is not in acute distress.    Appearance: She is well-developed. She is not diaphoretic.  Neck:     Thyroid: No thyromegaly.  Cardiovascular:     Rate and Rhythm: Normal rate and regular rhythm.     Pulses: Normal pulses.     Heart sounds: Normal heart sounds.  Pulmonary:     Effort: Pulmonary effort is normal. No respiratory distress.     Breath sounds: Normal breath sounds.  Abdominal:     General: Bowel sounds are normal. There is no distension.     Palpations: Abdomen is soft.     Tenderness: There is no abdominal tenderness.  Musculoskeletal:     Cervical back: Neck supple.     Right lower leg: No edema.     Left lower leg: No edema.     Comments: Contracture to neck with head leaning to right; kyphosis bilateral hands with arthritic changes  Lymphadenopathy:     Cervical: No cervical adenopathy.  Skin:    General: Skin is warm and dry.  Neurological:     Mental Status: She is alert and oriented to person, place, and time.  Psychiatric:        Mood and Affect: Mood normal.     ASSESSMENT/ PLAN:   Patient is being discharged with the following home health services:  pt/ot to evaluate and treat as indicated for gait balance strength adl training.   Patient is being discharged with the following durable medical equipment:  none needed   Patient has been advised to f/u with their PCP in 1-2 weeks to for a  transitions of care visit.  Social services at their facility was responsible for arranging this appointment.  Pt was provided with adequate prescriptions of noncontrolled medications to reach the scheduled appointment .  For controlled substances, a limited supply was provided as appropriate for the individual patient.  If the pt normally receives these medications from a pain clinic or has a contract with another physician, these medications should be received from that clinic or physician only).     A 30 day supply of her medications have been sent to [facility]  Time spent with patient 35 minutes: medications; home health; dme.   Synthia Innocent NP [facility] Adult Medicine  call [phone]

## 2021-12-13 DIAGNOSIS — J4489 Other specified chronic obstructive pulmonary disease: Secondary | ICD-10-CM | POA: Diagnosis not present

## 2021-12-13 DIAGNOSIS — R413 Other amnesia: Secondary | ICD-10-CM | POA: Diagnosis not present

## 2021-12-13 DIAGNOSIS — N390 Urinary tract infection, site not specified: Secondary | ICD-10-CM | POA: Diagnosis not present

## 2021-12-13 DIAGNOSIS — I10 Essential (primary) hypertension: Secondary | ICD-10-CM | POA: Diagnosis not present

## 2021-12-13 DIAGNOSIS — I2510 Atherosclerotic heart disease of native coronary artery without angina pectoris: Secondary | ICD-10-CM | POA: Diagnosis not present

## 2021-12-13 DIAGNOSIS — M40209 Unspecified kyphosis, site unspecified: Secondary | ICD-10-CM | POA: Diagnosis not present

## 2021-12-13 DIAGNOSIS — A419 Sepsis, unspecified organism: Secondary | ICD-10-CM | POA: Diagnosis not present

## 2021-12-13 DIAGNOSIS — I779 Disorder of arteries and arterioles, unspecified: Secondary | ICD-10-CM | POA: Diagnosis not present

## 2021-12-13 DIAGNOSIS — Z9181 History of falling: Secondary | ICD-10-CM | POA: Diagnosis not present

## 2021-12-13 DIAGNOSIS — M436 Torticollis: Secondary | ICD-10-CM | POA: Diagnosis not present

## 2021-12-13 DIAGNOSIS — K219 Gastro-esophageal reflux disease without esophagitis: Secondary | ICD-10-CM | POA: Diagnosis not present

## 2021-12-13 DIAGNOSIS — Z791 Long term (current) use of non-steroidal anti-inflammatories (NSAID): Secondary | ICD-10-CM | POA: Diagnosis not present

## 2021-12-13 DIAGNOSIS — H905 Unspecified sensorineural hearing loss: Secondary | ICD-10-CM | POA: Diagnosis not present

## 2021-12-13 DIAGNOSIS — I350 Nonrheumatic aortic (valve) stenosis: Secondary | ICD-10-CM | POA: Diagnosis not present

## 2021-12-13 DIAGNOSIS — R519 Headache, unspecified: Secondary | ICD-10-CM | POA: Diagnosis not present

## 2021-12-13 DIAGNOSIS — M1990 Unspecified osteoarthritis, unspecified site: Secondary | ICD-10-CM | POA: Diagnosis not present

## 2021-12-13 DIAGNOSIS — Z7982 Long term (current) use of aspirin: Secondary | ICD-10-CM | POA: Diagnosis not present

## 2021-12-14 DIAGNOSIS — F03B18 Unspecified dementia, moderate, with other behavioral disturbance: Secondary | ICD-10-CM | POA: Diagnosis not present

## 2021-12-14 DIAGNOSIS — G894 Chronic pain syndrome: Secondary | ICD-10-CM | POA: Diagnosis not present

## 2021-12-14 DIAGNOSIS — I10 Essential (primary) hypertension: Secondary | ICD-10-CM | POA: Diagnosis not present

## 2021-12-14 DIAGNOSIS — E871 Hypo-osmolality and hyponatremia: Secondary | ICD-10-CM | POA: Diagnosis not present

## 2021-12-14 DIAGNOSIS — E876 Hypokalemia: Secondary | ICD-10-CM | POA: Diagnosis not present

## 2021-12-16 DIAGNOSIS — I10 Essential (primary) hypertension: Secondary | ICD-10-CM | POA: Diagnosis not present

## 2021-12-16 DIAGNOSIS — M1990 Unspecified osteoarthritis, unspecified site: Secondary | ICD-10-CM | POA: Diagnosis not present

## 2021-12-16 DIAGNOSIS — Z9181 History of falling: Secondary | ICD-10-CM | POA: Diagnosis not present

## 2021-12-16 DIAGNOSIS — M436 Torticollis: Secondary | ICD-10-CM | POA: Diagnosis not present

## 2021-12-16 DIAGNOSIS — R413 Other amnesia: Secondary | ICD-10-CM | POA: Diagnosis not present

## 2021-12-16 DIAGNOSIS — Z791 Long term (current) use of non-steroidal anti-inflammatories (NSAID): Secondary | ICD-10-CM | POA: Diagnosis not present

## 2021-12-16 DIAGNOSIS — K219 Gastro-esophageal reflux disease without esophagitis: Secondary | ICD-10-CM | POA: Diagnosis not present

## 2021-12-16 DIAGNOSIS — I350 Nonrheumatic aortic (valve) stenosis: Secondary | ICD-10-CM | POA: Diagnosis not present

## 2021-12-16 DIAGNOSIS — A419 Sepsis, unspecified organism: Secondary | ICD-10-CM | POA: Diagnosis not present

## 2021-12-16 DIAGNOSIS — N390 Urinary tract infection, site not specified: Secondary | ICD-10-CM | POA: Diagnosis not present

## 2021-12-16 DIAGNOSIS — R519 Headache, unspecified: Secondary | ICD-10-CM | POA: Diagnosis not present

## 2021-12-16 DIAGNOSIS — Z7982 Long term (current) use of aspirin: Secondary | ICD-10-CM | POA: Diagnosis not present

## 2021-12-16 DIAGNOSIS — I2510 Atherosclerotic heart disease of native coronary artery without angina pectoris: Secondary | ICD-10-CM | POA: Diagnosis not present

## 2021-12-16 DIAGNOSIS — H905 Unspecified sensorineural hearing loss: Secondary | ICD-10-CM | POA: Diagnosis not present

## 2021-12-16 DIAGNOSIS — J4489 Other specified chronic obstructive pulmonary disease: Secondary | ICD-10-CM | POA: Diagnosis not present

## 2021-12-16 DIAGNOSIS — I779 Disorder of arteries and arterioles, unspecified: Secondary | ICD-10-CM | POA: Diagnosis not present

## 2021-12-16 DIAGNOSIS — M40209 Unspecified kyphosis, site unspecified: Secondary | ICD-10-CM | POA: Diagnosis not present

## 2021-12-17 ENCOUNTER — Encounter: Payer: Self-pay | Admitting: Adult Health

## 2021-12-17 ENCOUNTER — Other Ambulatory Visit: Payer: Self-pay | Admitting: *Deleted

## 2021-12-17 DIAGNOSIS — H905 Unspecified sensorineural hearing loss: Secondary | ICD-10-CM | POA: Diagnosis not present

## 2021-12-17 DIAGNOSIS — I2510 Atherosclerotic heart disease of native coronary artery without angina pectoris: Secondary | ICD-10-CM | POA: Diagnosis not present

## 2021-12-17 DIAGNOSIS — A419 Sepsis, unspecified organism: Secondary | ICD-10-CM | POA: Diagnosis not present

## 2021-12-17 DIAGNOSIS — M436 Torticollis: Secondary | ICD-10-CM | POA: Diagnosis not present

## 2021-12-17 DIAGNOSIS — M40209 Unspecified kyphosis, site unspecified: Secondary | ICD-10-CM | POA: Diagnosis not present

## 2021-12-17 DIAGNOSIS — Z791 Long term (current) use of non-steroidal anti-inflammatories (NSAID): Secondary | ICD-10-CM | POA: Diagnosis not present

## 2021-12-17 DIAGNOSIS — R413 Other amnesia: Secondary | ICD-10-CM | POA: Diagnosis not present

## 2021-12-17 DIAGNOSIS — K219 Gastro-esophageal reflux disease without esophagitis: Secondary | ICD-10-CM | POA: Diagnosis not present

## 2021-12-17 DIAGNOSIS — Z7982 Long term (current) use of aspirin: Secondary | ICD-10-CM | POA: Diagnosis not present

## 2021-12-17 DIAGNOSIS — I10 Essential (primary) hypertension: Secondary | ICD-10-CM | POA: Diagnosis not present

## 2021-12-17 DIAGNOSIS — R519 Headache, unspecified: Secondary | ICD-10-CM | POA: Diagnosis not present

## 2021-12-17 DIAGNOSIS — I350 Nonrheumatic aortic (valve) stenosis: Secondary | ICD-10-CM | POA: Diagnosis not present

## 2021-12-17 DIAGNOSIS — N390 Urinary tract infection, site not specified: Secondary | ICD-10-CM | POA: Diagnosis not present

## 2021-12-17 DIAGNOSIS — J4489 Other specified chronic obstructive pulmonary disease: Secondary | ICD-10-CM | POA: Diagnosis not present

## 2021-12-17 DIAGNOSIS — I779 Disorder of arteries and arterioles, unspecified: Secondary | ICD-10-CM | POA: Diagnosis not present

## 2021-12-17 DIAGNOSIS — Z9181 History of falling: Secondary | ICD-10-CM | POA: Diagnosis not present

## 2021-12-17 DIAGNOSIS — M1990 Unspecified osteoarthritis, unspecified site: Secondary | ICD-10-CM | POA: Diagnosis not present

## 2021-12-17 NOTE — Patient Outreach (Signed)
[facility] Post-Acute Care Coordinator follow up. Mrs. Beverly [name] discharged from [facility] on 12/10/21.  Verified with [facility] social worker Beverly HarveyBeverly [name] transitioned to home with [name]. Lives with children  No identifiable [facility] care coordination needs.   Raiford Noble, MSN, RN,BSN [facility] Post Acute Care Coordinator [phone] (Direct dial)

## 2021-12-17 NOTE — Progress Notes (Unsigned)
Location:  [facility] Room Number: NO/132/P Place of Service:  SNF (31) [name],[name],NP  CODE STATUS: DNR  Allergies  Allergen Reactions   Lisinopril Swelling   Eggs Or Egg-Derived Products Itching    Patient reports that she can eat eggs but does not tolerate egg derived products   Morphine And Related     Headache   Penicillins Itching    DID THE REACTION INVOLVE: Swelling of the face/tongue/throat, SOB, or low BP? No Sudden or severe rash/hives, skin peeling, or the inside of the mouth or nose? No Did it require medical treatment? No When did it last happen? Unknown If all above answers are "NO", may proceed with cephalosporin use.     Chief Complaint  Patient presents with   Medical Management of Chronic Issues    Patient is here for a follow up for chronic conditions    Immunizations    Patient is due for pneumonia and flu vaccine Discuss need for shingrix vaccine    HPI:    Past Medical History:  Diagnosis Date   Anxiety    Arthritis    COPD (chronic obstructive pulmonary disease) (HCC)    Coronary artery disease    a. STEMI with cardiac arrest (polymorphic VT) s/p DES to RCA.   GERD (gastroesophageal reflux disease)    Headache(784.0)    Hearing loss, central    Left ear   Hematoma    Hypertension    Memory changes    Mild aortic stenosis    Mild carotid artery disease (HCC)    a. 1-39% by duplex 02/2018.   Seizures (HCC) 10/2012   No seizures, speech issues; after a fall, hitting head on left side   UTI (urinary tract infection)    June 2022    Past Surgical History:  Procedure Laterality Date   ABDOMINAL HYSTERECTOMY     APPENDECTOMY     CARDIAC CATHETERIZATION     15 yrs ago   CORONARY/GRAFT ACUTE MI REVASCULARIZATION N/A 01/10/2018   Procedure: Coronary/Graft Acute MI Revascularization;  Surgeon: Runell Gess, MD;  Location: [facility];  Service: Cardiovascular;  Laterality: N/A;   CRANIOTOMY Left  10/30/2012   Procedure: CRANIOTOMY HEMATOMA EVACUATION SUBDURAL;  Surgeon: Maeola Harman, MD;  Location: [facility]URO OR[facility];  Service: Neurosurgery;  Laterality: Left;  Left Craniotomy for evacuation of subdural hematoma   JOINT REPLACEMENT Bilateral    knees   LEFT HEART CATH AND CORONARY ANGIOGRAPHY N/A 01/10/2018   Procedure: LEFT HEART CATH AND CORONARY ANGIOGRAPHY;  Surgeon: Runell Gess, MD;  Location: [facility] CV [facility];  Service: Cardiovascular;  Laterality: N/A;    Social History   Socioeconomic History   Marital status: Married    Spouse name: Not on file   Number of children: Not on file   Years of education: Not on file   Highest education level: Not on file  Occupational History   Not on file  Tobacco Use   Smoking status: Never   Smokeless tobacco: Never  Vaping Use   Vaping Use: Never used  Substance and Sexual Activity   Alcohol use: No   Drug use: No   Sexual activity: Not on file  Other Topics Concern   Not on file  Social History Narrative   Not on file   Social Determinants of Health   Financial Resource Strain: Not on file  Food Insecurity: No Food Insecurity (11/18/2021)   Hunger Vital Sign  Worried About Programme researcher, broadcasting/film/video in the Last Year: Never true    Ran Out of Food in the Last Year: Never true  Transportation Needs: No Transportation Needs (11/18/2021)   PRAPARE - Administrator, Civil Service (Medical): No    Lack of Transportation (Non-Medical): No  Physical Activity: Not on file  Stress: Not on file  Social Connections: Not on file  Intimate Partner Violence: Not At Risk (11/18/2021)   Humiliation, Afraid, Rape, and Kick questionnaire    Fear of Current or Ex-Partner: No    Emotionally Abused: No    Physically Abused: No    Sexually Abused: No   Family History  Problem Relation Age of Onset   Dementia Mother    Cancer Father    Dementia Maternal Aunt    Ataxia Neg Hx    Chorea Neg Hx    Mental retardation Neg Hx     Migraines Neg Hx    Multiple sclerosis Neg Hx    Neurofibromatosis Neg Hx    Neuropathy Neg Hx    Parkinsonism Neg Hx    Seizures Neg Hx    Stroke Neg Hx       VITAL SIGNS BP 136/86   Pulse 92   Temp 98.4 F (36.9 C)   Resp 18   Ht 5' (1.524 m)   Wt 137 lb (62.1 kg)   SpO2 95%   BMI 26.76 kg/m   Outpatient Encounter Medications as of 12/17/2021  Medication Sig   acetaminophen (TYLENOL) 500 MG tablet Take 500 mg by mouth every 8 (eight) hours as needed.   ALPRAZolam (XANAX) 0.5 MG tablet Take 1 tablet (0.5 mg total) by mouth at bedtime as needed for anxiety.   amLODipine (NORVASC) 5 MG tablet Take 1 tablet (5 mg total) by mouth daily.   aspirin 81 MG chewable tablet Chew 1 tablet (81 mg total) by mouth daily.   atorvastatin (LIPITOR) 80 MG tablet Take 1 tablet (80 mg total) by mouth daily at 6 PM.   carvedilol (COREG) 6.25 MG tablet TAKE ONE TABLET (6.25MG  TOTAL) BY MOUTH TWO TIMES DAILY   celecoxib (CELEBREX) 200 MG capsule Take 200 mg by mouth daily.   Cholecalciferol 25 MCG (1000 UT) TBDP Take 1 tablet by mouth daily.   clotrimazole (MYCELEX) 10 MG troche Take 10 mg by mouth 5 (five) times daily.   dicyclomine (BENTYL) 20 MG tablet Take 1 tablet (20 mg total) by mouth in the morning and at bedtime.   guaiFENesin (ROBITUSSIN) 100 MG/5ML liquid Take 5 mLs by mouth every 4 (four) hours as needed for cough or to loosen phlegm.   HYDROcodone-acetaminophen (NORCO) 10-325 MG tablet Take 1 tablet by mouth every 6 (six) hours as needed.   latanoprost (XALATAN) 0.005 % ophthalmic solution INSTILL ONE DROP IN BOTH EYES NIGHTLY   levETIRAcetam (KEPPRA) 100 MG/ML solution Take 5 mLs by mouth 2 (two) times daily.   levETIRAcetam (KEPPRA) 500 MG tablet Take 500 mg by mouth 2 (two) times daily.   memantine (NAMENDA) 5 MG tablet Take 1 tablet (5 mg total) by mouth 2 (two) times daily.   nitroGLYCERIN (NITROSTAT) 0.4 MG SL tablet Place 1 tablet (0.4 mg total) under the tongue every 5 (five)  minutes x 3 doses as needed for chest pain.   Nutritional Supplements (ENSURE ENLIVE PO) Take 237 mLs by mouth 2 (two) times daily.   omeprazole (PRILOSEC) 20 MG capsule Take 1 capsule (20 mg total) by mouth 2 (  two) times daily before a meal.   pantoprazole (PROTONIX) 40 MG tablet Take 40 mg by mouth daily.   potassium chloride SA (KLOR-CON M) 20 MEQ tablet Take 1 tablet (20 mEq total) by mouth daily.   Propylene Glycol (SYSTANE BALANCE OP) Place 2 drops into both eyes daily as needed (dry eyes).   risperiDONE (RISPERDAL) 0.5 MG tablet Take 1 tablet (0.5 mg total) by mouth 2 (two) times daily.   sodium chloride 1 g tablet Take 1 tablet (1 g total) by mouth daily.   [DISCONTINUED] lisinopril (ZESTRIL) 20 MG tablet Take 1 tablet (20 mg total) by mouth 2 (two) times daily.   No facility-administered encounter medications on file as of 12/17/2021.     SIGNIFICANT DIAGNOSTIC EXAMS       ASSESSMENT/ PLAN:     Synthia Innocent NP [facility]  Contact [phone] Monday through Friday 8am- 5pm  After hours call [phone]

## 2021-12-20 DIAGNOSIS — M1990 Unspecified osteoarthritis, unspecified site: Secondary | ICD-10-CM | POA: Diagnosis not present

## 2021-12-20 DIAGNOSIS — Z791 Long term (current) use of non-steroidal anti-inflammatories (NSAID): Secondary | ICD-10-CM | POA: Diagnosis not present

## 2021-12-20 DIAGNOSIS — F03A2 Unspecified dementia, mild, with psychotic disturbance: Secondary | ICD-10-CM | POA: Diagnosis not present

## 2021-12-20 DIAGNOSIS — N390 Urinary tract infection, site not specified: Secondary | ICD-10-CM | POA: Diagnosis not present

## 2021-12-20 DIAGNOSIS — Z7982 Long term (current) use of aspirin: Secondary | ICD-10-CM | POA: Diagnosis not present

## 2021-12-20 DIAGNOSIS — I350 Nonrheumatic aortic (valve) stenosis: Secondary | ICD-10-CM | POA: Diagnosis not present

## 2021-12-20 DIAGNOSIS — I2510 Atherosclerotic heart disease of native coronary artery without angina pectoris: Secondary | ICD-10-CM | POA: Diagnosis not present

## 2021-12-20 DIAGNOSIS — K219 Gastro-esophageal reflux disease without esophagitis: Secondary | ICD-10-CM | POA: Diagnosis not present

## 2021-12-20 DIAGNOSIS — I779 Disorder of arteries and arterioles, unspecified: Secondary | ICD-10-CM | POA: Diagnosis not present

## 2021-12-20 DIAGNOSIS — H905 Unspecified sensorineural hearing loss: Secondary | ICD-10-CM | POA: Diagnosis not present

## 2021-12-20 DIAGNOSIS — J4489 Other specified chronic obstructive pulmonary disease: Secondary | ICD-10-CM | POA: Diagnosis not present

## 2021-12-20 DIAGNOSIS — J449 Chronic obstructive pulmonary disease, unspecified: Secondary | ICD-10-CM | POA: Diagnosis not present

## 2021-12-20 DIAGNOSIS — I10 Essential (primary) hypertension: Secondary | ICD-10-CM | POA: Diagnosis not present

## 2021-12-20 DIAGNOSIS — Z9181 History of falling: Secondary | ICD-10-CM | POA: Diagnosis not present

## 2021-12-20 DIAGNOSIS — A419 Sepsis, unspecified organism: Secondary | ICD-10-CM | POA: Diagnosis not present

## 2021-12-20 DIAGNOSIS — R413 Other amnesia: Secondary | ICD-10-CM | POA: Diagnosis not present

## 2021-12-20 DIAGNOSIS — R519 Headache, unspecified: Secondary | ICD-10-CM | POA: Diagnosis not present

## 2021-12-20 DIAGNOSIS — M436 Torticollis: Secondary | ICD-10-CM | POA: Diagnosis not present

## 2021-12-20 DIAGNOSIS — M40209 Unspecified kyphosis, site unspecified: Secondary | ICD-10-CM | POA: Diagnosis not present

## 2021-12-21 DIAGNOSIS — J4489 Other specified chronic obstructive pulmonary disease: Secondary | ICD-10-CM | POA: Diagnosis not present

## 2021-12-21 DIAGNOSIS — M1990 Unspecified osteoarthritis, unspecified site: Secondary | ICD-10-CM | POA: Diagnosis not present

## 2021-12-21 DIAGNOSIS — R519 Headache, unspecified: Secondary | ICD-10-CM | POA: Diagnosis not present

## 2021-12-21 DIAGNOSIS — I779 Disorder of arteries and arterioles, unspecified: Secondary | ICD-10-CM | POA: Diagnosis not present

## 2021-12-21 DIAGNOSIS — Z7982 Long term (current) use of aspirin: Secondary | ICD-10-CM | POA: Diagnosis not present

## 2021-12-21 DIAGNOSIS — Z791 Long term (current) use of non-steroidal anti-inflammatories (NSAID): Secondary | ICD-10-CM | POA: Diagnosis not present

## 2021-12-21 DIAGNOSIS — I10 Essential (primary) hypertension: Secondary | ICD-10-CM | POA: Diagnosis not present

## 2021-12-21 DIAGNOSIS — R413 Other amnesia: Secondary | ICD-10-CM | POA: Diagnosis not present

## 2021-12-21 DIAGNOSIS — Z9181 History of falling: Secondary | ICD-10-CM | POA: Diagnosis not present

## 2021-12-21 DIAGNOSIS — I350 Nonrheumatic aortic (valve) stenosis: Secondary | ICD-10-CM | POA: Diagnosis not present

## 2021-12-21 DIAGNOSIS — H905 Unspecified sensorineural hearing loss: Secondary | ICD-10-CM | POA: Diagnosis not present

## 2021-12-21 DIAGNOSIS — I2510 Atherosclerotic heart disease of native coronary artery without angina pectoris: Secondary | ICD-10-CM | POA: Diagnosis not present

## 2021-12-21 DIAGNOSIS — M436 Torticollis: Secondary | ICD-10-CM | POA: Diagnosis not present

## 2021-12-21 DIAGNOSIS — M40209 Unspecified kyphosis, site unspecified: Secondary | ICD-10-CM | POA: Diagnosis not present

## 2021-12-21 DIAGNOSIS — A419 Sepsis, unspecified organism: Secondary | ICD-10-CM | POA: Diagnosis not present

## 2021-12-21 DIAGNOSIS — N390 Urinary tract infection, site not specified: Secondary | ICD-10-CM | POA: Diagnosis not present

## 2021-12-21 DIAGNOSIS — K219 Gastro-esophageal reflux disease without esophagitis: Secondary | ICD-10-CM | POA: Diagnosis not present

## 2021-12-22 NOTE — Progress Notes (Signed)
This encounter was created in error - please disregard.

## 2021-12-23 DIAGNOSIS — I350 Nonrheumatic aortic (valve) stenosis: Secondary | ICD-10-CM | POA: Diagnosis not present

## 2021-12-23 DIAGNOSIS — Z791 Long term (current) use of non-steroidal anti-inflammatories (NSAID): Secondary | ICD-10-CM | POA: Diagnosis not present

## 2021-12-23 DIAGNOSIS — I10 Essential (primary) hypertension: Secondary | ICD-10-CM | POA: Diagnosis not present

## 2021-12-23 DIAGNOSIS — M40209 Unspecified kyphosis, site unspecified: Secondary | ICD-10-CM | POA: Diagnosis not present

## 2021-12-23 DIAGNOSIS — M436 Torticollis: Secondary | ICD-10-CM | POA: Diagnosis not present

## 2021-12-23 DIAGNOSIS — R413 Other amnesia: Secondary | ICD-10-CM | POA: Diagnosis not present

## 2021-12-23 DIAGNOSIS — K219 Gastro-esophageal reflux disease without esophagitis: Secondary | ICD-10-CM | POA: Diagnosis not present

## 2021-12-23 DIAGNOSIS — Z7982 Long term (current) use of aspirin: Secondary | ICD-10-CM | POA: Diagnosis not present

## 2021-12-23 DIAGNOSIS — M1990 Unspecified osteoarthritis, unspecified site: Secondary | ICD-10-CM | POA: Diagnosis not present

## 2021-12-23 DIAGNOSIS — H905 Unspecified sensorineural hearing loss: Secondary | ICD-10-CM | POA: Diagnosis not present

## 2021-12-23 DIAGNOSIS — N390 Urinary tract infection, site not specified: Secondary | ICD-10-CM | POA: Diagnosis not present

## 2021-12-23 DIAGNOSIS — I779 Disorder of arteries and arterioles, unspecified: Secondary | ICD-10-CM | POA: Diagnosis not present

## 2021-12-23 DIAGNOSIS — J4489 Other specified chronic obstructive pulmonary disease: Secondary | ICD-10-CM | POA: Diagnosis not present

## 2021-12-23 DIAGNOSIS — Z9181 History of falling: Secondary | ICD-10-CM | POA: Diagnosis not present

## 2021-12-23 DIAGNOSIS — R519 Headache, unspecified: Secondary | ICD-10-CM | POA: Diagnosis not present

## 2021-12-23 DIAGNOSIS — A419 Sepsis, unspecified organism: Secondary | ICD-10-CM | POA: Diagnosis not present

## 2021-12-23 DIAGNOSIS — I2510 Atherosclerotic heart disease of native coronary artery without angina pectoris: Secondary | ICD-10-CM | POA: Diagnosis not present

## 2021-12-24 DIAGNOSIS — M1990 Unspecified osteoarthritis, unspecified site: Secondary | ICD-10-CM | POA: Diagnosis not present

## 2021-12-24 DIAGNOSIS — R413 Other amnesia: Secondary | ICD-10-CM | POA: Diagnosis not present

## 2021-12-24 DIAGNOSIS — Z791 Long term (current) use of non-steroidal anti-inflammatories (NSAID): Secondary | ICD-10-CM | POA: Diagnosis not present

## 2021-12-24 DIAGNOSIS — H905 Unspecified sensorineural hearing loss: Secondary | ICD-10-CM | POA: Diagnosis not present

## 2021-12-24 DIAGNOSIS — R519 Headache, unspecified: Secondary | ICD-10-CM | POA: Diagnosis not present

## 2021-12-24 DIAGNOSIS — K219 Gastro-esophageal reflux disease without esophagitis: Secondary | ICD-10-CM | POA: Diagnosis not present

## 2021-12-24 DIAGNOSIS — J4489 Other specified chronic obstructive pulmonary disease: Secondary | ICD-10-CM | POA: Diagnosis not present

## 2021-12-24 DIAGNOSIS — M436 Torticollis: Secondary | ICD-10-CM | POA: Diagnosis not present

## 2021-12-24 DIAGNOSIS — I2510 Atherosclerotic heart disease of native coronary artery without angina pectoris: Secondary | ICD-10-CM | POA: Diagnosis not present

## 2021-12-24 DIAGNOSIS — I10 Essential (primary) hypertension: Secondary | ICD-10-CM | POA: Diagnosis not present

## 2021-12-24 DIAGNOSIS — A419 Sepsis, unspecified organism: Secondary | ICD-10-CM | POA: Diagnosis not present

## 2021-12-24 DIAGNOSIS — I779 Disorder of arteries and arterioles, unspecified: Secondary | ICD-10-CM | POA: Diagnosis not present

## 2021-12-24 DIAGNOSIS — Z9181 History of falling: Secondary | ICD-10-CM | POA: Diagnosis not present

## 2021-12-24 DIAGNOSIS — Z7982 Long term (current) use of aspirin: Secondary | ICD-10-CM | POA: Diagnosis not present

## 2021-12-24 DIAGNOSIS — N390 Urinary tract infection, site not specified: Secondary | ICD-10-CM | POA: Diagnosis not present

## 2021-12-24 DIAGNOSIS — M40209 Unspecified kyphosis, site unspecified: Secondary | ICD-10-CM | POA: Diagnosis not present

## 2021-12-24 DIAGNOSIS — I350 Nonrheumatic aortic (valve) stenosis: Secondary | ICD-10-CM | POA: Diagnosis not present

## 2021-12-28 DIAGNOSIS — J4489 Other specified chronic obstructive pulmonary disease: Secondary | ICD-10-CM | POA: Diagnosis not present

## 2021-12-28 DIAGNOSIS — M1990 Unspecified osteoarthritis, unspecified site: Secondary | ICD-10-CM | POA: Diagnosis not present

## 2021-12-28 DIAGNOSIS — R519 Headache, unspecified: Secondary | ICD-10-CM | POA: Diagnosis not present

## 2021-12-28 DIAGNOSIS — I2510 Atherosclerotic heart disease of native coronary artery without angina pectoris: Secondary | ICD-10-CM | POA: Diagnosis not present

## 2021-12-28 DIAGNOSIS — N390 Urinary tract infection, site not specified: Secondary | ICD-10-CM | POA: Diagnosis not present

## 2021-12-28 DIAGNOSIS — I10 Essential (primary) hypertension: Secondary | ICD-10-CM | POA: Diagnosis not present

## 2021-12-28 DIAGNOSIS — Z9181 History of falling: Secondary | ICD-10-CM | POA: Diagnosis not present

## 2021-12-28 DIAGNOSIS — K219 Gastro-esophageal reflux disease without esophagitis: Secondary | ICD-10-CM | POA: Diagnosis not present

## 2021-12-28 DIAGNOSIS — R413 Other amnesia: Secondary | ICD-10-CM | POA: Diagnosis not present

## 2021-12-28 DIAGNOSIS — A419 Sepsis, unspecified organism: Secondary | ICD-10-CM | POA: Diagnosis not present

## 2021-12-28 DIAGNOSIS — I779 Disorder of arteries and arterioles, unspecified: Secondary | ICD-10-CM | POA: Diagnosis not present

## 2021-12-28 DIAGNOSIS — H905 Unspecified sensorineural hearing loss: Secondary | ICD-10-CM | POA: Diagnosis not present

## 2021-12-28 DIAGNOSIS — Z7982 Long term (current) use of aspirin: Secondary | ICD-10-CM | POA: Diagnosis not present

## 2021-12-28 DIAGNOSIS — Z791 Long term (current) use of non-steroidal anti-inflammatories (NSAID): Secondary | ICD-10-CM | POA: Diagnosis not present

## 2021-12-28 DIAGNOSIS — M436 Torticollis: Secondary | ICD-10-CM | POA: Diagnosis not present

## 2021-12-28 DIAGNOSIS — I350 Nonrheumatic aortic (valve) stenosis: Secondary | ICD-10-CM | POA: Diagnosis not present

## 2021-12-28 DIAGNOSIS — M40209 Unspecified kyphosis, site unspecified: Secondary | ICD-10-CM | POA: Diagnosis not present

## 2021-12-29 DIAGNOSIS — M40209 Unspecified kyphosis, site unspecified: Secondary | ICD-10-CM | POA: Diagnosis not present

## 2021-12-29 DIAGNOSIS — Z7982 Long term (current) use of aspirin: Secondary | ICD-10-CM | POA: Diagnosis not present

## 2021-12-29 DIAGNOSIS — R519 Headache, unspecified: Secondary | ICD-10-CM | POA: Diagnosis not present

## 2021-12-29 DIAGNOSIS — R413 Other amnesia: Secondary | ICD-10-CM | POA: Diagnosis not present

## 2021-12-29 DIAGNOSIS — M1990 Unspecified osteoarthritis, unspecified site: Secondary | ICD-10-CM | POA: Diagnosis not present

## 2021-12-29 DIAGNOSIS — Z9181 History of falling: Secondary | ICD-10-CM | POA: Diagnosis not present

## 2021-12-29 DIAGNOSIS — M436 Torticollis: Secondary | ICD-10-CM | POA: Diagnosis not present

## 2021-12-29 DIAGNOSIS — I350 Nonrheumatic aortic (valve) stenosis: Secondary | ICD-10-CM | POA: Diagnosis not present

## 2021-12-29 DIAGNOSIS — K219 Gastro-esophageal reflux disease without esophagitis: Secondary | ICD-10-CM | POA: Diagnosis not present

## 2021-12-29 DIAGNOSIS — H905 Unspecified sensorineural hearing loss: Secondary | ICD-10-CM | POA: Diagnosis not present

## 2021-12-29 DIAGNOSIS — Z791 Long term (current) use of non-steroidal anti-inflammatories (NSAID): Secondary | ICD-10-CM | POA: Diagnosis not present

## 2021-12-29 DIAGNOSIS — A419 Sepsis, unspecified organism: Secondary | ICD-10-CM | POA: Diagnosis not present

## 2021-12-29 DIAGNOSIS — I779 Disorder of arteries and arterioles, unspecified: Secondary | ICD-10-CM | POA: Diagnosis not present

## 2021-12-29 DIAGNOSIS — J4489 Other specified chronic obstructive pulmonary disease: Secondary | ICD-10-CM | POA: Diagnosis not present

## 2021-12-29 DIAGNOSIS — I10 Essential (primary) hypertension: Secondary | ICD-10-CM | POA: Diagnosis not present

## 2021-12-29 DIAGNOSIS — I2510 Atherosclerotic heart disease of native coronary artery without angina pectoris: Secondary | ICD-10-CM | POA: Diagnosis not present

## 2021-12-29 DIAGNOSIS — N390 Urinary tract infection, site not specified: Secondary | ICD-10-CM | POA: Diagnosis not present

## 2021-12-30 DIAGNOSIS — Z7982 Long term (current) use of aspirin: Secondary | ICD-10-CM | POA: Diagnosis not present

## 2021-12-30 DIAGNOSIS — Z9181 History of falling: Secondary | ICD-10-CM | POA: Diagnosis not present

## 2021-12-30 DIAGNOSIS — A419 Sepsis, unspecified organism: Secondary | ICD-10-CM | POA: Diagnosis not present

## 2021-12-30 DIAGNOSIS — I350 Nonrheumatic aortic (valve) stenosis: Secondary | ICD-10-CM | POA: Diagnosis not present

## 2021-12-30 DIAGNOSIS — M40209 Unspecified kyphosis, site unspecified: Secondary | ICD-10-CM | POA: Diagnosis not present

## 2021-12-30 DIAGNOSIS — R413 Other amnesia: Secondary | ICD-10-CM | POA: Diagnosis not present

## 2021-12-30 DIAGNOSIS — J4489 Other specified chronic obstructive pulmonary disease: Secondary | ICD-10-CM | POA: Diagnosis not present

## 2021-12-30 DIAGNOSIS — N390 Urinary tract infection, site not specified: Secondary | ICD-10-CM | POA: Diagnosis not present

## 2021-12-30 DIAGNOSIS — H905 Unspecified sensorineural hearing loss: Secondary | ICD-10-CM | POA: Diagnosis not present

## 2021-12-30 DIAGNOSIS — R519 Headache, unspecified: Secondary | ICD-10-CM | POA: Diagnosis not present

## 2021-12-30 DIAGNOSIS — I779 Disorder of arteries and arterioles, unspecified: Secondary | ICD-10-CM | POA: Diagnosis not present

## 2021-12-30 DIAGNOSIS — M436 Torticollis: Secondary | ICD-10-CM | POA: Diagnosis not present

## 2021-12-30 DIAGNOSIS — I2510 Atherosclerotic heart disease of native coronary artery without angina pectoris: Secondary | ICD-10-CM | POA: Diagnosis not present

## 2021-12-30 DIAGNOSIS — M1990 Unspecified osteoarthritis, unspecified site: Secondary | ICD-10-CM | POA: Diagnosis not present

## 2021-12-30 DIAGNOSIS — I10 Essential (primary) hypertension: Secondary | ICD-10-CM | POA: Diagnosis not present

## 2021-12-30 DIAGNOSIS — K219 Gastro-esophageal reflux disease without esophagitis: Secondary | ICD-10-CM | POA: Diagnosis not present

## 2021-12-30 DIAGNOSIS — Z791 Long term (current) use of non-steroidal anti-inflammatories (NSAID): Secondary | ICD-10-CM | POA: Diagnosis not present

## 2021-12-31 DIAGNOSIS — A419 Sepsis, unspecified organism: Secondary | ICD-10-CM | POA: Diagnosis not present

## 2021-12-31 DIAGNOSIS — R413 Other amnesia: Secondary | ICD-10-CM | POA: Diagnosis not present

## 2021-12-31 DIAGNOSIS — Z7982 Long term (current) use of aspirin: Secondary | ICD-10-CM | POA: Diagnosis not present

## 2021-12-31 DIAGNOSIS — N390 Urinary tract infection, site not specified: Secondary | ICD-10-CM | POA: Diagnosis not present

## 2021-12-31 DIAGNOSIS — I10 Essential (primary) hypertension: Secondary | ICD-10-CM | POA: Diagnosis not present

## 2021-12-31 DIAGNOSIS — M1990 Unspecified osteoarthritis, unspecified site: Secondary | ICD-10-CM | POA: Diagnosis not present

## 2021-12-31 DIAGNOSIS — M436 Torticollis: Secondary | ICD-10-CM | POA: Diagnosis not present

## 2021-12-31 DIAGNOSIS — H905 Unspecified sensorineural hearing loss: Secondary | ICD-10-CM | POA: Diagnosis not present

## 2021-12-31 DIAGNOSIS — R519 Headache, unspecified: Secondary | ICD-10-CM | POA: Diagnosis not present

## 2021-12-31 DIAGNOSIS — K219 Gastro-esophageal reflux disease without esophagitis: Secondary | ICD-10-CM | POA: Diagnosis not present

## 2021-12-31 DIAGNOSIS — M40209 Unspecified kyphosis, site unspecified: Secondary | ICD-10-CM | POA: Diagnosis not present

## 2021-12-31 DIAGNOSIS — Z791 Long term (current) use of non-steroidal anti-inflammatories (NSAID): Secondary | ICD-10-CM | POA: Diagnosis not present

## 2021-12-31 DIAGNOSIS — I779 Disorder of arteries and arterioles, unspecified: Secondary | ICD-10-CM | POA: Diagnosis not present

## 2021-12-31 DIAGNOSIS — Z9181 History of falling: Secondary | ICD-10-CM | POA: Diagnosis not present

## 2021-12-31 DIAGNOSIS — J4489 Other specified chronic obstructive pulmonary disease: Secondary | ICD-10-CM | POA: Diagnosis not present

## 2021-12-31 DIAGNOSIS — I350 Nonrheumatic aortic (valve) stenosis: Secondary | ICD-10-CM | POA: Diagnosis not present

## 2021-12-31 DIAGNOSIS — I2510 Atherosclerotic heart disease of native coronary artery without angina pectoris: Secondary | ICD-10-CM | POA: Diagnosis not present

## 2022-01-04 DIAGNOSIS — M40209 Unspecified kyphosis, site unspecified: Secondary | ICD-10-CM | POA: Diagnosis not present

## 2022-01-04 DIAGNOSIS — K219 Gastro-esophageal reflux disease without esophagitis: Secondary | ICD-10-CM | POA: Diagnosis not present

## 2022-01-04 DIAGNOSIS — I350 Nonrheumatic aortic (valve) stenosis: Secondary | ICD-10-CM | POA: Diagnosis not present

## 2022-01-04 DIAGNOSIS — M436 Torticollis: Secondary | ICD-10-CM | POA: Diagnosis not present

## 2022-01-04 DIAGNOSIS — M1990 Unspecified osteoarthritis, unspecified site: Secondary | ICD-10-CM | POA: Diagnosis not present

## 2022-01-04 DIAGNOSIS — R413 Other amnesia: Secondary | ICD-10-CM | POA: Diagnosis not present

## 2022-01-04 DIAGNOSIS — Z9181 History of falling: Secondary | ICD-10-CM | POA: Diagnosis not present

## 2022-01-04 DIAGNOSIS — H905 Unspecified sensorineural hearing loss: Secondary | ICD-10-CM | POA: Diagnosis not present

## 2022-01-04 DIAGNOSIS — I779 Disorder of arteries and arterioles, unspecified: Secondary | ICD-10-CM | POA: Diagnosis not present

## 2022-01-04 DIAGNOSIS — R519 Headache, unspecified: Secondary | ICD-10-CM | POA: Diagnosis not present

## 2022-01-04 DIAGNOSIS — N390 Urinary tract infection, site not specified: Secondary | ICD-10-CM | POA: Diagnosis not present

## 2022-01-04 DIAGNOSIS — I2510 Atherosclerotic heart disease of native coronary artery without angina pectoris: Secondary | ICD-10-CM | POA: Diagnosis not present

## 2022-01-04 DIAGNOSIS — J4489 Other specified chronic obstructive pulmonary disease: Secondary | ICD-10-CM | POA: Diagnosis not present

## 2022-01-04 DIAGNOSIS — A419 Sepsis, unspecified organism: Secondary | ICD-10-CM | POA: Diagnosis not present

## 2022-01-04 DIAGNOSIS — Z7982 Long term (current) use of aspirin: Secondary | ICD-10-CM | POA: Diagnosis not present

## 2022-01-04 DIAGNOSIS — Z791 Long term (current) use of non-steroidal anti-inflammatories (NSAID): Secondary | ICD-10-CM | POA: Diagnosis not present

## 2022-01-04 DIAGNOSIS — I10 Essential (primary) hypertension: Secondary | ICD-10-CM | POA: Diagnosis not present

## 2022-01-10 DIAGNOSIS — R350 Frequency of micturition: Secondary | ICD-10-CM | POA: Diagnosis not present

## 2022-01-10 DIAGNOSIS — N390 Urinary tract infection, site not specified: Secondary | ICD-10-CM | POA: Diagnosis not present

## 2022-01-31 ENCOUNTER — Other Ambulatory Visit: Payer: Self-pay | Admitting: Internal Medicine

## 2022-02-11 DIAGNOSIS — D509 Iron deficiency anemia, unspecified: Secondary | ICD-10-CM | POA: Diagnosis not present

## 2022-02-11 DIAGNOSIS — I10 Essential (primary) hypertension: Secondary | ICD-10-CM | POA: Diagnosis not present

## 2022-02-18 DIAGNOSIS — F03B18 Unspecified dementia, moderate, with other behavioral disturbance: Secondary | ICD-10-CM | POA: Diagnosis not present

## 2022-02-18 DIAGNOSIS — R350 Frequency of micturition: Secondary | ICD-10-CM | POA: Diagnosis not present

## 2022-02-18 DIAGNOSIS — F03918 Unspecified dementia, unspecified severity, with other behavioral disturbance: Secondary | ICD-10-CM | POA: Diagnosis not present

## 2022-02-18 DIAGNOSIS — K219 Gastro-esophageal reflux disease without esophagitis: Secondary | ICD-10-CM | POA: Diagnosis not present

## 2022-02-18 DIAGNOSIS — K582 Mixed irritable bowel syndrome: Secondary | ICD-10-CM | POA: Diagnosis not present

## 2022-02-18 DIAGNOSIS — I10 Essential (primary) hypertension: Secondary | ICD-10-CM | POA: Diagnosis not present

## 2022-02-18 DIAGNOSIS — M6281 Muscle weakness (generalized): Secondary | ICD-10-CM | POA: Diagnosis not present

## 2022-02-18 DIAGNOSIS — I2510 Atherosclerotic heart disease of native coronary artery without angina pectoris: Secondary | ICD-10-CM | POA: Diagnosis not present

## 2022-02-18 DIAGNOSIS — N390 Urinary tract infection, site not specified: Secondary | ICD-10-CM | POA: Diagnosis not present

## 2022-02-21 ENCOUNTER — Ambulatory Visit: Payer: Medicare Other | Admitting: Urology

## 2022-02-21 VITALS — BP 175/83 | HR 75

## 2022-02-21 DIAGNOSIS — R350 Frequency of micturition: Secondary | ICD-10-CM | POA: Diagnosis not present

## 2022-02-21 LAB — BLADDER SCAN AMB NON-IMAGING: Scan Result: 237

## 2022-02-21 NOTE — Progress Notes (Cosign Needed Addendum)
post void residual=212m.  Patient able to void 204mafter PVR.  Urine sent for ua.

## 2022-02-21 NOTE — Progress Notes (Unsigned)
02/21/2022 3:36 PM   Beverly [name] 06/29/1932 [id number]  Referring provider: Valentino Nose, FNP [place],  [place] [id number]  No chief complaint on file.   HPI:  1) frequency, urgency - over past few years she has had urinary frequency and urgency. Good stream and no gross hematuria. No bulge. No NG risk. No constipation.   No GU surgery. She had a hystrectomy.   She has had some UTI and sepsis. She had confusion. She was taking the UTI prevention marketed on TV and it worked. UA clear today. PVR today - Feb 2024 - 37 ml. CT 2022 - benign. No stone.   She was a Marine scientist in [employer], dispensing. Here with her daughter.   PMH: Past Medical History:  Diagnosis Date   Anxiety    Arthritis    COPD (chronic obstructive pulmonary disease) ([place])    Coronary artery disease    a. STEMI with cardiac arrest (polymorphic VT) s/p DES to RCA.   GERD (gastroesophageal reflux disease)    Headache(784.0)    Hearing loss, central    Left ear   Hematoma    Hypertension    Memory changes    Mild aortic stenosis    Mild carotid artery disease (HCC)    a. 1-39% by duplex 02/2018.   Seizures ([place]) 10/2012   No seizures, speech issues; after a fall, hitting head on left side   UTI (urinary tract infection)    June 2022    Surgical History: Past Surgical History:  Procedure Laterality Date   ABDOMINAL HYSTERECTOMY     APPENDECTOMY     CARDIAC CATHETERIZATION     15 yrs ago   CORONARY/GRAFT ACUTE MI REVASCULARIZATION N/A 01/10/2018   Procedure: Coronary/Graft Acute MI Revascularization;  Surgeon: Lorretta Harp, MD;  Location: [place] CV LAB;  Service: Cardiovascular;  Laterality: N/A;   CRANIOTOMY Left 10/30/2012   Procedure: CRANIOTOMY HEMATOMA EVACUATION SUBDURAL;  Surgeon: Erline Levine, MD;  Location: [place] NEURO ORS;  Service: Neurosurgery;  Laterality: Left;  Left Craniotomy for evacuation of subdural hematoma   JOINT REPLACEMENT Bilateral    knees   LEFT HEART CATH  AND CORONARY ANGIOGRAPHY N/A 01/10/2018   Procedure: LEFT HEART CATH AND CORONARY ANGIOGRAPHY;  Surgeon: Lorretta Harp, MD;  Location: [place] CV LAB;  Service: Cardiovascular;  Laterality: N/A;    Home Medications:  Allergies as of 02/21/2022       Reactions   Lisinopril Swelling   Eggs Or Egg-derived Products Itching   Patient reports that she can eat eggs but does not tolerate egg derived products   Morphine And Related    Headache   Penicillins Itching   DID THE REACTION INVOLVE: Swelling of the face/tongue/throat, SOB, or low BP? No Sudden or severe rash/hives, skin peeling, or the inside of the mouth or nose? No Did it require medical treatment? No When did it last happen? Unknown If all above answers are "NO", may proceed with cephalosporin use.        Medication List        Accurate as of February 21, 2022  3:36 PM. If you have any questions, ask your nurse or doctor.          acetaminophen 500 MG tablet Commonly known as: TYLENOL Take 500 mg by mouth every 8 (eight) hours as needed.   ALPRAZolam 0.5 MG tablet Commonly known as: XANAX Take 1 tablet (0.5 mg total) by mouth at bedtime  as needed for anxiety.   amLODipine 5 MG tablet Commonly known as: NORVASC Take 1 tablet (5 mg total) by mouth daily.   aspirin 81 MG chewable tablet Chew 1 tablet (81 mg total) by mouth daily.   atorvastatin 80 MG tablet Commonly known as: LIPITOR Take 1 tablet (80 mg total) by mouth daily at 6 PM.   carvedilol 6.25 MG tablet Commonly known as: COREG TAKE ONE TABLET (6.25MG TOTAL) BY MOUTH TWO TIMES DAILY   celecoxib 200 MG capsule Commonly known as: CELEBREX Take 200 mg by mouth daily.   Cholecalciferol 25 MCG (1000 UT) Tbdp Take 1 tablet by mouth daily.   clotrimazole 10 MG troche Commonly known as: MYCELEX Take 10 mg by mouth 5 (five) times daily.   dicyclomine 20 MG tablet Commonly known as: BENTYL Take 1 tablet (20 mg total) by mouth in the morning  and at bedtime.   ENSURE ENLIVE PO Take 237 mLs by mouth 2 (two) times daily.   guaiFENesin 100 MG/5ML liquid Commonly known as: ROBITUSSIN Take 5 mLs by mouth every 4 (four) hours as needed for cough or to loosen phlegm.   HYDROcodone-acetaminophen 10-325 MG tablet Commonly known as: NORCO Take 1 tablet by mouth every 6 (six) hours as needed.   latanoprost 0.005 % ophthalmic solution Commonly known as: XALATAN INSTILL ONE DROP IN BOTH EYES NIGHTLY   levETIRAcetam 100 MG/ML solution Commonly known as: KEPPRA Take 5 mLs by mouth 2 (two) times daily.   levETIRAcetam 500 MG tablet Commonly known as: KEPPRA Take 500 mg by mouth 2 (two) times daily.   memantine 5 MG tablet Commonly known as: NAMENDA Take 1 tablet (5 mg total) by mouth 2 (two) times daily.   nitroGLYCERIN 0.4 MG SL tablet Commonly known as: NITROSTAT Place 1 tablet (0.4 mg total) under the tongue every 5 (five) minutes x 3 doses as needed for chest pain.   omeprazole 20 MG capsule Commonly known as: PRILOSEC Take 1 capsule (20 mg total) by mouth 2 (two) times daily before a meal.   pantoprazole 40 MG tablet Commonly known as: PROTONIX Take 40 mg by mouth daily.   potassium chloride SA 20 MEQ tablet Commonly known as: KLOR-CON M Take 1 tablet (20 mEq total) by mouth daily.   risperiDONE 0.5 MG tablet Commonly known as: RISPERDAL Take 1 tablet (0.5 mg total) by mouth 2 (two) times daily.   sodium chloride 1 g tablet Take 1 tablet (1 g total) by mouth daily.   SYSTANE BALANCE OP Place 2 drops into both eyes daily as needed (dry eyes).        Allergies:  Allergies  Allergen Reactions   Lisinopril Swelling   Eggs Or Egg-Derived Products Itching    Patient reports that she can eat eggs but does not tolerate egg derived products   Morphine And Related     Headache   Penicillins Itching    DID THE REACTION INVOLVE: Swelling of the face/tongue/throat, SOB, or low BP? No Sudden or severe  rash/hives, skin peeling, or the inside of the mouth or nose? No Did it require medical treatment? No When did it last happen? Unknown If all above answers are "NO", may proceed with cephalosporin use.     Family History: Family History  Problem Relation Age of Onset   Dementia Mother    Cancer Father    Dementia Maternal Aunt    Ataxia Neg Hx    Chorea Neg Hx    Mental retardation  Neg Hx    Migraines Neg Hx    Multiple sclerosis Neg Hx    Neurofibromatosis Neg Hx    Neuropathy Neg Hx    Parkinsonism Neg Hx    Seizures Neg Hx    Stroke Neg Hx     Social History:  reports that she has never smoked. She has never used smokeless tobacco. She reports that she does not drink alcohol and does not use drugs.   Physical Exam: BP (!) 175/83   Pulse 75   Constitutional:  Alert and oriented, No acute distress. HEENT: [place] AT, moist mucus membranes.  Trachea midline, no masses. Cardiovascular: No clubbing, cyanosis, or edema. Respiratory: Normal respiratory effort, no increased work of breathing. GI: Abdomen is soft, nontender, nondistended, no abdominal masses GU: No CVA tenderness Skin: No rashes, bruises or suspicious lesions. Neurologic: Grossly intact, no focal deficits, moving all 4 extremities. Psychiatric: Normal mood and affect.  Laboratory Data: Lab Results  Component Value Date   WBC 11.1 (H) 11/29/2021   HGB 11.3 (L) 11/29/2021   HCT 33.9 (L) 11/29/2021   MCV 88.7 11/29/2021   PLT 634 (H) 11/29/2021    Lab Results  Component Value Date   CREATININE 0.52 12/02/2021    No results found for: "PSA"  No results found for: "TESTOSTERONE"  Lab Results  Component Value Date   HGBA1C 6.1 (H) 10/19/2012    Urinalysis    Component Value Date/Time   COLORURINE AMBER (A) 11/18/2021 0516   APPEARANCEUR CLOUDY (A) 11/18/2021 0516   LABSPEC 1.028 11/18/2021 0516   PHURINE 5.0 11/18/2021 0516   GLUCOSEU NEGATIVE 11/18/2021 0516   HGBUR NEGATIVE 11/18/2021 0516    BILIRUBINUR NEGATIVE 11/18/2021 0516   KETONESUR NEGATIVE 11/18/2021 0516   PROTEINUR 100 (A) 11/18/2021 0516   UROBILINOGEN 0.2 10/18/2012 1944   NITRITE NEGATIVE 11/18/2021 0516   LEUKOCYTESUR NEGATIVE 11/18/2021 0516    Lab Results  Component Value Date   BACTERIA MANY (A) 11/18/2021    Pertinent Imaging: Ct 2022 - no stone or hydro   Assessment & Plan:    1. Urine frequency - she is a little better with more water intake. Disc importance of good water intake. Disc oab meds. Disc exam and cystoscopy. They will continue surveillance.   Also discussed d-mannose and probiotics are in the supplements marketed on TV.   - BLADDER SCAN AMB NON-IMAGING - Urinalysis, Routine w reflex microscopic   No follow-ups on file.  Festus Aloe, MD  [facility]  [address] [place], [place] [address] [phone]

## 2022-02-22 LAB — URINALYSIS, ROUTINE W REFLEX MICROSCOPIC
Bilirubin, UA: NEGATIVE
Glucose, UA: NEGATIVE
Ketones, UA: NEGATIVE
Nitrite, UA: NEGATIVE
Protein,UA: NEGATIVE
RBC, UA: NEGATIVE
Specific Gravity, UA: 1.015 (ref 1.005–1.030)
Urobilinogen, Ur: 0.2 mg/dL (ref 0.2–1.0)
pH, UA: 6 (ref 5.0–7.5)

## 2022-02-22 LAB — MICROSCOPIC EXAMINATION: Bacteria, UA: NONE SEEN

## 2022-02-28 ENCOUNTER — Other Ambulatory Visit: Payer: Self-pay | Admitting: Internal Medicine

## 2022-02-28 ENCOUNTER — Ambulatory Visit
Admission: RE | Admit: 2022-02-28 | Discharge: 2022-02-28 | Disposition: A | Discharge status: Home / Self Care | Payer: Medicare Other | Source: Ambulatory Visit | Attending: Nurse Practitioner | Admitting: Nurse Practitioner

## 2022-02-28 VITALS — BP 161/70 | HR 66 | Temp 98.2°F | Resp 16

## 2022-02-28 DIAGNOSIS — H6123 Impacted cerumen, bilateral: Secondary | ICD-10-CM

## 2022-02-28 NOTE — Discharge Instructions (Addendum)
We were able to clear some of the earwax from your ear canal today.  Please start using Debrox drops at home to help loosen the rest of the earwax.  Recommend following up with ear nose and throat with no improvement or worsening of symptoms despite treatment.

## 2022-02-28 NOTE — ED Triage Notes (Signed)
Pt got new hearing aids last week and the doctor told her she had a lot of wax build up and needed it removed.

## 2022-02-28 NOTE — ED Provider Notes (Signed)
[facility]    CSN: [id number] Arrival date & time: 02/28/22  1501      History   Chief Complaint Chief Complaint  Patient presents with   Cerumen Impaction    HPI Beverly [name] is a 89 y.o. female.   Patient presents today with daughter for impacted cerumen.  Reports she was seen to get new hearing aids and the doctor told her she needed her ears cleaned out.  She denies ear pain or drainage.  Reports that she has had to have her ears rinsed in the past and tolerated it well.  Has not tried anything for symptoms so far.  No recent cough, congestion, or sore throat.  Does not use Q-tips.    Past Medical History:  Diagnosis Date   Anxiety    Arthritis    COPD (chronic obstructive pulmonary disease) ([place])    Coronary artery disease    a. STEMI with cardiac arrest (polymorphic VT) s/p DES to RCA.   GERD (gastroesophageal reflux disease)    Headache(784.0)    Hearing loss, central    Left ear   Hematoma    Hypertension    Memory changes    Mild aortic stenosis    Mild carotid artery disease (HCC)    a. 1-39% by duplex 02/2018.   Seizures ([place]) 10/2012   No seizures, speech issues; after a fall, hitting head on left side   UTI (urinary tract infection)    June 2022    Patient Active Problem List   Diagnosis Date Noted   AKI (acute kidney injury) ([place]) 11/26/2021   Arthritis 11/24/2021   Increased intraocular pressure, bilateral 11/24/2021   GERD without esophagitis 11/24/2021   MCI (mild cognitive impairment) 11/24/2021   Major depression, recurrent, chronic ([place]) 11/24/2021   Aortic atherosclerosis ([place]) 11/24/2021   Coronary artery disease with angina pectoris ([place]) 11/24/2021   Septic shock ([place]) 11/18/2021   Right epiretinal membrane 04/13/2021   Lobar pneumonia ([employer]) 05/09/2020   Goals of care, counseling/discussion 05/09/2020   Lower urinary tract infectious disease    Sepsis due to undetermined organism ([place]) 05/07/2020   Pleural effusion,  right    SIRS (systemic inflammatory response syndrome) ([place])    Pneumonia 05/02/2020   CAP (community acquired pneumonia) 05/01/2020   Anemia 05/01/2020   Heme positive stool 05/01/2020   Advanced nonexudative age-related macular degeneration of right eye with subfoveal involvement 11/11/2019   Exudative age-related macular degeneration of left eye with active choroidal neovascularization ([place]) 05/13/2019   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 neovascularization ([place]) 05/13/2019   Primary open angle glaucoma of both eyes, mild stage 05/13/2019   Age-related nuclear cataract of left eye 05/13/2019   Hypokalemia 01/12/2018   Hypomagnesemia 01/12/2018   COPD (chronic obstructive pulmonary disease) ([place]) 01/12/2018   Acute ST elevation myocardial infarction (STEMI) of inferior wall ([place]) 01/10/2018   Cardiac arrest (HCC)    Dyslipidemia (high LDL; low HDL)    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, not intractable, without status epilepticus ([place]) 06/23/2014   History of traumatic subdural hematoma 06/23/2014   Subdural hematoma ([place]) 10/18/2012   TIA (transient ischemic attack) 10/18/2012   HTN (hypertension) 10/18/2012   Hyponatremia 10/18/2012    Past Surgical History:  Procedure Laterality Date   ABDOMINAL HYSTERECTOMY     APPENDECTOMY     CARDIAC CATHETERIZATION     15 yrs ago   CORONARY/GRAFT ACUTE MI REVASCULARIZATION N/A 01/10/2018  Procedure: Coronary/Graft Acute MI Revascularization;  Surgeon: Lorretta Harp, MD;  Location: [facility];  Service: Cardiovascular;  Laterality: N/A;   CRANIOTOMY Left 10/30/2012   Procedure: CRANIOTOMY HEMATOMA EVACUATION SUBDURAL;  Surgeon: Erline Levine, MD;  Location: [facility];  Service: Neurosurgery;  Laterality: Left;  Left Craniotomy for evacuation of subdural hematoma   JOINT REPLACEMENT Bilateral    knees   LEFT HEART CATH AND  CORONARY ANGIOGRAPHY N/A 01/10/2018   Procedure: LEFT HEART CATH AND CORONARY ANGIOGRAPHY;  Surgeon: Lorretta Harp, MD;  Location: [facility];  Service: Cardiovascular;  Laterality: N/A;    OB History     Gravida      Para      Term      Preterm      AB      Living  2      SAB      IAB      Ectopic      Multiple      Live Births               Home Medications    Prior to Admission medications   Medication Sig Start Date End Date Taking? Authorizing Provider  acetaminophen (TYLENOL) 500 MG tablet Take 500 mg by mouth every 8 (eight) hours as needed.   Yes [provider]  ALPRAZolam Duanne Moron) 0.5 MG tablet Take 1 tablet (0.5 mg total) by mouth at bedtime as needed for anxiety. 12/08/21  Yes Gerlene Fee, NP  amLODipine (NORVASC) 5 MG tablet Take 1 tablet (5 mg total) by mouth daily. Patient taking differently: Take 10 mg by mouth daily. 12/08/21  Yes Gerlene Fee, NP  aspirin 81 MG chewable tablet Chew 1 tablet (81 mg total) by mouth daily. 01/13/18  Yes Kathyrn Drown D, NP  atorvastatin (LIPITOR) 80 MG tablet Take 1 tablet (80 mg total) by mouth daily at 6 PM. 12/08/21  Yes Gerlene Fee, NP  carvedilol (COREG) 6.25 MG tablet TAKE ONE TABLET (6.'25MG'$  TOTAL) BY MOUTH TWO TIMES DAILY 12/08/21  Yes Gerlene Fee, NP  celecoxib (CELEBREX) 200 MG capsule Take 200 mg by mouth daily.   Yes [provider]  Cholecalciferol 25 MCG (1000 UT) TBDP Take 1 tablet by mouth daily.   Yes [provider]  clotrimazole (MYCELEX) 10 MG troche Take 10 mg by mouth 5 (five) times daily.   Yes [provider]  dicyclomine (BENTYL) 20 MG tablet Take 1 tablet (20 mg total) by mouth in the morning and at bedtime. 12/08/21  Yes Gerlene Fee, NP  HYDROcodone-acetaminophen (NORCO) 10-325 MG tablet Take 1 tablet by mouth every 6 (six) hours as needed.   Yes [provider]  latanoprost (XALATAN) 0.005 % ophthalmic solution INSTILL  ONE DROP IN BOTH EYES NIGHTLY 12/08/21  Yes Gerlene Fee, NP  levETIRAcetam (KEPPRA) 100 MG/ML solution Take 5 mLs by mouth 2 (two) times daily.   Yes [provider]  levETIRAcetam (KEPPRA) 500 MG tablet Take 500 mg by mouth 2 (two) times daily.   Yes [provider]  memantine (NAMENDA) 5 MG tablet Take 1 tablet (5 mg total) by mouth 2 (two) times daily. 12/08/21  Yes Gerlene Fee, NP  Nutritional Supplements (ENSURE ENLIVE PO) Take 237 mLs by mouth 2 (two) times daily.   Yes [provider]  omeprazole (PRILOSEC) 20 MG capsule Take 1 capsule (20 mg total) by mouth 2 (two) times daily  before a meal. 12/08/21  Yes Gerlene Fee, NP  pantoprazole (PROTONIX) 40 MG tablet Take 40 mg by mouth daily.   Yes [provider]  Propylene Glycol (SYSTANE BALANCE OP) Place 2 drops into both eyes daily as needed (dry eyes).   Yes [provider]  risperiDONE (RISPERDAL) 0.5 MG tablet Take 1 tablet (0.5 mg total) by mouth 2 (two) times daily. 12/08/21  Yes Gerlene Fee, NP  guaiFENesin (ROBITUSSIN) 100 MG/5ML liquid Take 5 mLs by mouth every 4 (four) hours as needed for cough or to loosen phlegm. 11/23/21   Manuella Ghazi, Pratik D, DO  nitroGLYCERIN (NITROSTAT) 0.4 MG SL tablet Place 1 tablet (0.4 mg total) under the tongue every 5 (five) minutes x 3 doses as needed for chest pain. 12/08/21   Gerlene Fee, NP  potassium chloride SA (KLOR-CON M) 20 MEQ tablet Take 1 tablet (20 mEq total) by mouth daily. 12/08/21   Gerlene Fee, NP  sodium chloride 1 g tablet Take 1 tablet (1 g total) by mouth daily. 12/08/21   Gerlene Fee, NP  lisinopril (ZESTRIL) 20 MG tablet Take 1 tablet (20 mg total) by mouth 2 (two) times daily. 01/24/19 05/03/19  Erma Heritage, PA-C    Family History Family History  Problem Relation Age of Onset   Dementia Mother    Cancer Father    Dementia Maternal Aunt    Ataxia Neg Hx    Chorea Neg Hx    Mental retardation Neg Hx     Migraines Neg Hx    Multiple sclerosis Neg Hx    Neurofibromatosis Neg Hx    Neuropathy Neg Hx    Parkinsonism Neg Hx    Seizures Neg Hx    Stroke Neg Hx     Social History Social History   Tobacco Use   Smoking status: Never   Smokeless tobacco: Never  Vaping Use   Vaping Use: Never used  Substance Use Topics   Alcohol use: No   Drug use: No     Allergies   Lisinopril, Eggs or egg-derived products, Morphine and related, and Penicillins   Review of Systems Review of Systems Per HPI  Physical Exam Triage Vital Signs ED Triage Vitals  Enc Vitals Group     BP 02/28/22 1509 (!) 161/70     Pulse Rate 02/28/22 1509 66     Resp 02/28/22 1509 16     Temp 02/28/22 1509 98.2 F (36.8 C)     Temp Source 02/28/22 1509 Oral     SpO2 02/28/22 1509 93 %     Weight --      Height --      Head Circumference --      Peak Flow --      Pain Score 02/28/22 1511 0     Pain Loc --      Pain Edu? --      Excl. in [place]? --    No data found.  Updated Vital Signs BP (!) 161/70 (BP Location: Right Arm)   Pulse 66   Temp 98.2 F (36.8 C) (Oral)   Resp 16   SpO2 93%   Visual Acuity Right Eye Distance:   Left Eye Distance:   Bilateral Distance:    Right Eye Near:   Left Eye Near:    Bilateral Near:     Physical Exam Vitals and nursing note reviewed.  Constitutional:      General: She is not in acute  distress.    Appearance: Normal appearance. She is not ill-appearing or toxic-appearing.  HENT:     Head: Normocephalic and atraumatic.     Right Ear: There is impacted cerumen.     Left Ear: There is impacted cerumen.     Mouth/Throat:     Mouth: Mucous membranes are moist.     Pharynx: Oropharynx is clear.  Skin:    General: Skin is warm and dry.     Coloration: Skin is not jaundiced or pale.     Findings: No erythema or rash.  Neurological:     Mental Status: She is alert and oriented to person, place, and time.  Psychiatric:        Behavior: Behavior is  cooperative.      UC Treatments / Results  Labs (all labs ordered are listed, but only abnormal results are displayed) Labs Reviewed - No data to display  EKG   Radiology No results found.  Procedures Ear Cerumen Removal  Date/Time: 02/28/2022 3:54 PM  Performed by: Eulogio Bear, NP Authorized by: Eulogio Bear, NP   Consent:    Consent obtained:  Verbal   Consent given by:  Patient   Risks, benefits, and alternatives were discussed: yes     Risks discussed:  Bleeding, infection, pain, TM perforation and dizziness   Alternatives discussed:  Alternative treatment Universal protocol:    Procedure explained and questions answered to patient or proxy's satisfaction: yes     Patient identity confirmed:  Verbally with patient Procedure details:    Location:  L ear and R ear   Procedure type: irrigation     Procedure outcomes: unable to remove cerumen   Post-procedure details:    Inspection:  Ear canal clear, TM intact and some cerumen remaining   Hearing quality:  Normal (baseline)   Procedure completion:  Tolerated well, no immediate complications  (including critical care time)  Medications Ordered in UC Medications - No data to display  Initial Impression / Assessment and Plan / UC Course  I have reviewed the triage vital signs and the nursing notes.  Pertinent labs & imaging results that were available during my care of the patient were reviewed by me and considered in my medical decision making (see chart for details).   Patient is well-appearing, normotensive, afebrile, not tachycardic, not tachypneic, oxygenating well on room air.    1. Bilateral impacted cerumen Ear lavage provided without relief I gently used a curette to remove the white wax after Tympanic membrane's intact bilaterally, no bleeding Recommended use of Debrox to help remove remaining earwax Follow-up with ENT with no improvement or worsening of symptoms despite treatment  The  patient was given the opportunity to ask questions.  All questions answered to their satisfaction.  The patient is in agreement to this plan.    Final Clinical Impressions(s) / UC Diagnoses   Final diagnoses:  Bilateral impacted cerumen     Discharge Instructions      We were able to clear some of the earwax from your ear canal today.  Please start using Debrox drops at home to help loosen the rest of the earwax.  Recommend following up with ear nose and throat with no improvement or worsening of symptoms despite treatment.    ED Prescriptions   None    PDMP not reviewed this encounter.   Eulogio Bear, NP 02/28/22 [phone]

## 2022-02-28 NOTE — Telephone Encounter (Signed)
Send to PCP, Dr Celene Squibb

## 2022-02-28 NOTE — Telephone Encounter (Signed)
PCP is Dr Celene Squibb ; she was discharged from [facility] in Dec 2023.

## 2022-03-14 DIAGNOSIS — F03A2 Unspecified dementia, mild, with psychotic disturbance: Secondary | ICD-10-CM | POA: Diagnosis not present

## 2022-03-14 DIAGNOSIS — J449 Chronic obstructive pulmonary disease, unspecified: Secondary | ICD-10-CM | POA: Diagnosis not present

## 2022-03-14 DIAGNOSIS — Z515 Encounter for palliative care: Secondary | ICD-10-CM | POA: Diagnosis not present

## 2022-04-04 DIAGNOSIS — I10 Essential (primary) hypertension: Secondary | ICD-10-CM | POA: Diagnosis not present

## 2022-04-04 DIAGNOSIS — R1030 Lower abdominal pain, unspecified: Secondary | ICD-10-CM | POA: Diagnosis not present

## 2022-04-12 ENCOUNTER — Encounter (INDEPENDENT_AMBULATORY_CARE_PROVIDER_SITE_OTHER): Payer: Medicare Other | Admitting: Ophthalmology

## 2022-04-28 ENCOUNTER — Ambulatory Visit: Payer: Medicare Other | Admitting: Cardiology

## 2022-05-02 DIAGNOSIS — F03A2 Unspecified dementia, mild, with psychotic disturbance: Secondary | ICD-10-CM | POA: Diagnosis not present

## 2022-05-02 DIAGNOSIS — Z515 Encounter for palliative care: Secondary | ICD-10-CM | POA: Diagnosis not present

## 2022-05-02 DIAGNOSIS — J449 Chronic obstructive pulmonary disease, unspecified: Secondary | ICD-10-CM | POA: Diagnosis not present

## 2022-05-23 ENCOUNTER — Ambulatory Visit: Payer: Medicare Other | Admitting: Urology

## 2022-05-24 DIAGNOSIS — H25812 Combined forms of age-related cataract, left eye: Secondary | ICD-10-CM | POA: Diagnosis not present

## 2022-06-07 ENCOUNTER — Ambulatory Visit: Payer: Medicare Other | Admitting: Cardiology

## 2022-06-07 DIAGNOSIS — M542 Cervicalgia: Secondary | ICD-10-CM | POA: Diagnosis not present

## 2022-06-07 DIAGNOSIS — G8929 Other chronic pain: Secondary | ICD-10-CM | POA: Diagnosis not present

## 2022-06-07 DIAGNOSIS — R519 Headache, unspecified: Secondary | ICD-10-CM | POA: Diagnosis not present

## 2022-06-07 DIAGNOSIS — B372 Candidiasis of skin and nail: Secondary | ICD-10-CM | POA: Diagnosis not present

## 2022-06-07 DIAGNOSIS — M5481 Occipital neuralgia: Secondary | ICD-10-CM | POA: Diagnosis not present

## 2022-06-09 DIAGNOSIS — Z7982 Long term (current) use of aspirin: Secondary | ICD-10-CM | POA: Diagnosis not present

## 2022-06-09 DIAGNOSIS — Z79899 Other long term (current) drug therapy: Secondary | ICD-10-CM | POA: Diagnosis not present

## 2022-06-09 DIAGNOSIS — E875 Hyperkalemia: Secondary | ICD-10-CM | POA: Diagnosis not present

## 2022-06-09 DIAGNOSIS — A419 Sepsis, unspecified organism: Secondary | ICD-10-CM | POA: Diagnosis not present

## 2022-06-09 DIAGNOSIS — B372 Candidiasis of skin and nail: Secondary | ICD-10-CM | POA: Diagnosis not present

## 2022-06-09 DIAGNOSIS — D509 Iron deficiency anemia, unspecified: Secondary | ICD-10-CM | POA: Diagnosis not present

## 2022-06-09 DIAGNOSIS — M542 Cervicalgia: Secondary | ICD-10-CM | POA: Diagnosis not present

## 2022-06-09 DIAGNOSIS — F03A4 Unspecified dementia, mild, with anxiety: Secondary | ICD-10-CM | POA: Diagnosis not present

## 2022-06-09 DIAGNOSIS — F03A3 Unspecified dementia, mild, with mood disturbance: Secondary | ICD-10-CM | POA: Diagnosis not present

## 2022-06-09 DIAGNOSIS — M5481 Occipital neuralgia: Secondary | ICD-10-CM | POA: Diagnosis not present

## 2022-06-09 DIAGNOSIS — E876 Hypokalemia: Secondary | ICD-10-CM | POA: Diagnosis not present

## 2022-06-09 DIAGNOSIS — Z556 Problems related to health literacy: Secondary | ICD-10-CM | POA: Diagnosis not present

## 2022-06-09 DIAGNOSIS — I2510 Atherosclerotic heart disease of native coronary artery without angina pectoris: Secondary | ICD-10-CM | POA: Diagnosis not present

## 2022-06-09 DIAGNOSIS — J449 Chronic obstructive pulmonary disease, unspecified: Secondary | ICD-10-CM | POA: Diagnosis not present

## 2022-06-09 DIAGNOSIS — I10 Essential (primary) hypertension: Secondary | ICD-10-CM | POA: Diagnosis not present

## 2022-06-09 DIAGNOSIS — Z791 Long term (current) use of non-steroidal anti-inflammatories (NSAID): Secondary | ICD-10-CM | POA: Diagnosis not present

## 2022-06-13 DIAGNOSIS — D509 Iron deficiency anemia, unspecified: Secondary | ICD-10-CM | POA: Diagnosis not present

## 2022-06-13 DIAGNOSIS — I10 Essential (primary) hypertension: Secondary | ICD-10-CM | POA: Diagnosis not present

## 2022-06-13 DIAGNOSIS — Z556 Problems related to health literacy: Secondary | ICD-10-CM | POA: Diagnosis not present

## 2022-06-13 DIAGNOSIS — Z79899 Other long term (current) drug therapy: Secondary | ICD-10-CM | POA: Diagnosis not present

## 2022-06-13 DIAGNOSIS — E876 Hypokalemia: Secondary | ICD-10-CM | POA: Diagnosis not present

## 2022-06-13 DIAGNOSIS — F03A3 Unspecified dementia, mild, with mood disturbance: Secondary | ICD-10-CM | POA: Diagnosis not present

## 2022-06-13 DIAGNOSIS — Z791 Long term (current) use of non-steroidal anti-inflammatories (NSAID): Secondary | ICD-10-CM | POA: Diagnosis not present

## 2022-06-13 DIAGNOSIS — Z7982 Long term (current) use of aspirin: Secondary | ICD-10-CM | POA: Diagnosis not present

## 2022-06-13 DIAGNOSIS — M542 Cervicalgia: Secondary | ICD-10-CM | POA: Diagnosis not present

## 2022-06-13 DIAGNOSIS — I2510 Atherosclerotic heart disease of native coronary artery without angina pectoris: Secondary | ICD-10-CM | POA: Diagnosis not present

## 2022-06-13 DIAGNOSIS — F03A4 Unspecified dementia, mild, with anxiety: Secondary | ICD-10-CM | POA: Diagnosis not present

## 2022-06-13 DIAGNOSIS — B372 Candidiasis of skin and nail: Secondary | ICD-10-CM | POA: Diagnosis not present

## 2022-06-13 DIAGNOSIS — M5481 Occipital neuralgia: Secondary | ICD-10-CM | POA: Diagnosis not present

## 2022-06-13 DIAGNOSIS — J449 Chronic obstructive pulmonary disease, unspecified: Secondary | ICD-10-CM | POA: Diagnosis not present

## 2022-06-13 DIAGNOSIS — A419 Sepsis, unspecified organism: Secondary | ICD-10-CM | POA: Diagnosis not present

## 2022-06-13 DIAGNOSIS — E875 Hyperkalemia: Secondary | ICD-10-CM | POA: Diagnosis not present

## 2022-06-16 DIAGNOSIS — D509 Iron deficiency anemia, unspecified: Secondary | ICD-10-CM | POA: Diagnosis not present

## 2022-06-16 DIAGNOSIS — E876 Hypokalemia: Secondary | ICD-10-CM | POA: Diagnosis not present

## 2022-06-16 DIAGNOSIS — B372 Candidiasis of skin and nail: Secondary | ICD-10-CM | POA: Diagnosis not present

## 2022-06-16 DIAGNOSIS — Z556 Problems related to health literacy: Secondary | ICD-10-CM | POA: Diagnosis not present

## 2022-06-16 DIAGNOSIS — A419 Sepsis, unspecified organism: Secondary | ICD-10-CM | POA: Diagnosis not present

## 2022-06-16 DIAGNOSIS — Z791 Long term (current) use of non-steroidal anti-inflammatories (NSAID): Secondary | ICD-10-CM | POA: Diagnosis not present

## 2022-06-16 DIAGNOSIS — I2510 Atherosclerotic heart disease of native coronary artery without angina pectoris: Secondary | ICD-10-CM | POA: Diagnosis not present

## 2022-06-16 DIAGNOSIS — Z7982 Long term (current) use of aspirin: Secondary | ICD-10-CM | POA: Diagnosis not present

## 2022-06-16 DIAGNOSIS — I10 Essential (primary) hypertension: Secondary | ICD-10-CM | POA: Diagnosis not present

## 2022-06-16 DIAGNOSIS — E875 Hyperkalemia: Secondary | ICD-10-CM | POA: Diagnosis not present

## 2022-06-16 DIAGNOSIS — Z79899 Other long term (current) drug therapy: Secondary | ICD-10-CM | POA: Diagnosis not present

## 2022-06-16 DIAGNOSIS — F03A4 Unspecified dementia, mild, with anxiety: Secondary | ICD-10-CM | POA: Diagnosis not present

## 2022-06-16 DIAGNOSIS — M5481 Occipital neuralgia: Secondary | ICD-10-CM | POA: Diagnosis not present

## 2022-06-16 DIAGNOSIS — M542 Cervicalgia: Secondary | ICD-10-CM | POA: Diagnosis not present

## 2022-06-16 DIAGNOSIS — F03A3 Unspecified dementia, mild, with mood disturbance: Secondary | ICD-10-CM | POA: Diagnosis not present

## 2022-06-16 DIAGNOSIS — J449 Chronic obstructive pulmonary disease, unspecified: Secondary | ICD-10-CM | POA: Diagnosis not present

## 2022-06-20 DIAGNOSIS — F03A2 Unspecified dementia, mild, with psychotic disturbance: Secondary | ICD-10-CM | POA: Diagnosis not present

## 2022-06-20 DIAGNOSIS — Z515 Encounter for palliative care: Secondary | ICD-10-CM | POA: Diagnosis not present

## 2022-06-20 DIAGNOSIS — J449 Chronic obstructive pulmonary disease, unspecified: Secondary | ICD-10-CM | POA: Diagnosis not present

## 2022-06-21 DIAGNOSIS — F03A3 Unspecified dementia, mild, with mood disturbance: Secondary | ICD-10-CM | POA: Diagnosis not present

## 2022-06-21 DIAGNOSIS — F03A4 Unspecified dementia, mild, with anxiety: Secondary | ICD-10-CM | POA: Diagnosis not present

## 2022-06-21 DIAGNOSIS — Z7982 Long term (current) use of aspirin: Secondary | ICD-10-CM | POA: Diagnosis not present

## 2022-06-21 DIAGNOSIS — B372 Candidiasis of skin and nail: Secondary | ICD-10-CM | POA: Diagnosis not present

## 2022-06-21 DIAGNOSIS — Z791 Long term (current) use of non-steroidal anti-inflammatories (NSAID): Secondary | ICD-10-CM | POA: Diagnosis not present

## 2022-06-21 DIAGNOSIS — M5481 Occipital neuralgia: Secondary | ICD-10-CM | POA: Diagnosis not present

## 2022-06-21 DIAGNOSIS — E875 Hyperkalemia: Secondary | ICD-10-CM | POA: Diagnosis not present

## 2022-06-21 DIAGNOSIS — A419 Sepsis, unspecified organism: Secondary | ICD-10-CM | POA: Diagnosis not present

## 2022-06-21 DIAGNOSIS — J449 Chronic obstructive pulmonary disease, unspecified: Secondary | ICD-10-CM | POA: Diagnosis not present

## 2022-06-21 DIAGNOSIS — I10 Essential (primary) hypertension: Secondary | ICD-10-CM | POA: Diagnosis not present

## 2022-06-21 DIAGNOSIS — I2510 Atherosclerotic heart disease of native coronary artery without angina pectoris: Secondary | ICD-10-CM | POA: Diagnosis not present

## 2022-06-21 DIAGNOSIS — E876 Hypokalemia: Secondary | ICD-10-CM | POA: Diagnosis not present

## 2022-06-21 DIAGNOSIS — Z79899 Other long term (current) drug therapy: Secondary | ICD-10-CM | POA: Diagnosis not present

## 2022-06-21 DIAGNOSIS — M542 Cervicalgia: Secondary | ICD-10-CM | POA: Diagnosis not present

## 2022-06-21 DIAGNOSIS — D509 Iron deficiency anemia, unspecified: Secondary | ICD-10-CM | POA: Diagnosis not present

## 2022-06-21 DIAGNOSIS — Z556 Problems related to health literacy: Secondary | ICD-10-CM | POA: Diagnosis not present

## 2022-06-23 DIAGNOSIS — B372 Candidiasis of skin and nail: Secondary | ICD-10-CM | POA: Diagnosis not present

## 2022-06-23 DIAGNOSIS — I10 Essential (primary) hypertension: Secondary | ICD-10-CM | POA: Diagnosis not present

## 2022-06-23 DIAGNOSIS — F03A3 Unspecified dementia, mild, with mood disturbance: Secondary | ICD-10-CM | POA: Diagnosis not present

## 2022-06-23 DIAGNOSIS — J449 Chronic obstructive pulmonary disease, unspecified: Secondary | ICD-10-CM | POA: Diagnosis not present

## 2022-06-23 DIAGNOSIS — I2510 Atherosclerotic heart disease of native coronary artery without angina pectoris: Secondary | ICD-10-CM | POA: Diagnosis not present

## 2022-06-23 DIAGNOSIS — Z791 Long term (current) use of non-steroidal anti-inflammatories (NSAID): Secondary | ICD-10-CM | POA: Diagnosis not present

## 2022-06-23 DIAGNOSIS — M542 Cervicalgia: Secondary | ICD-10-CM | POA: Diagnosis not present

## 2022-06-23 DIAGNOSIS — E875 Hyperkalemia: Secondary | ICD-10-CM | POA: Diagnosis not present

## 2022-06-23 DIAGNOSIS — M5481 Occipital neuralgia: Secondary | ICD-10-CM | POA: Diagnosis not present

## 2022-06-23 DIAGNOSIS — Z79899 Other long term (current) drug therapy: Secondary | ICD-10-CM | POA: Diagnosis not present

## 2022-06-23 DIAGNOSIS — Z556 Problems related to health literacy: Secondary | ICD-10-CM | POA: Diagnosis not present

## 2022-06-23 DIAGNOSIS — F03A4 Unspecified dementia, mild, with anxiety: Secondary | ICD-10-CM | POA: Diagnosis not present

## 2022-06-23 DIAGNOSIS — A419 Sepsis, unspecified organism: Secondary | ICD-10-CM | POA: Diagnosis not present

## 2022-06-23 DIAGNOSIS — E876 Hypokalemia: Secondary | ICD-10-CM | POA: Diagnosis not present

## 2022-06-23 DIAGNOSIS — D509 Iron deficiency anemia, unspecified: Secondary | ICD-10-CM | POA: Diagnosis not present

## 2022-06-23 DIAGNOSIS — Z7982 Long term (current) use of aspirin: Secondary | ICD-10-CM | POA: Diagnosis not present

## 2022-06-28 DIAGNOSIS — D509 Iron deficiency anemia, unspecified: Secondary | ICD-10-CM | POA: Diagnosis not present

## 2022-06-28 DIAGNOSIS — I10 Essential (primary) hypertension: Secondary | ICD-10-CM | POA: Diagnosis not present

## 2022-06-28 DIAGNOSIS — A419 Sepsis, unspecified organism: Secondary | ICD-10-CM | POA: Diagnosis not present

## 2022-06-28 DIAGNOSIS — Z7982 Long term (current) use of aspirin: Secondary | ICD-10-CM | POA: Diagnosis not present

## 2022-06-28 DIAGNOSIS — Z556 Problems related to health literacy: Secondary | ICD-10-CM | POA: Diagnosis not present

## 2022-06-28 DIAGNOSIS — Z79899 Other long term (current) drug therapy: Secondary | ICD-10-CM | POA: Diagnosis not present

## 2022-06-28 DIAGNOSIS — E875 Hyperkalemia: Secondary | ICD-10-CM | POA: Diagnosis not present

## 2022-06-28 DIAGNOSIS — F03A4 Unspecified dementia, mild, with anxiety: Secondary | ICD-10-CM | POA: Diagnosis not present

## 2022-06-28 DIAGNOSIS — M542 Cervicalgia: Secondary | ICD-10-CM | POA: Diagnosis not present

## 2022-06-28 DIAGNOSIS — J449 Chronic obstructive pulmonary disease, unspecified: Secondary | ICD-10-CM | POA: Diagnosis not present

## 2022-06-28 DIAGNOSIS — Z791 Long term (current) use of non-steroidal anti-inflammatories (NSAID): Secondary | ICD-10-CM | POA: Diagnosis not present

## 2022-06-28 DIAGNOSIS — I2510 Atherosclerotic heart disease of native coronary artery without angina pectoris: Secondary | ICD-10-CM | POA: Diagnosis not present

## 2022-06-28 DIAGNOSIS — M5481 Occipital neuralgia: Secondary | ICD-10-CM | POA: Diagnosis not present

## 2022-06-28 DIAGNOSIS — E876 Hypokalemia: Secondary | ICD-10-CM | POA: Diagnosis not present

## 2022-06-28 DIAGNOSIS — B372 Candidiasis of skin and nail: Secondary | ICD-10-CM | POA: Diagnosis not present

## 2022-06-28 DIAGNOSIS — F03A3 Unspecified dementia, mild, with mood disturbance: Secondary | ICD-10-CM | POA: Diagnosis not present

## 2022-06-30 DIAGNOSIS — I2510 Atherosclerotic heart disease of native coronary artery without angina pectoris: Secondary | ICD-10-CM | POA: Diagnosis not present

## 2022-06-30 DIAGNOSIS — M542 Cervicalgia: Secondary | ICD-10-CM | POA: Diagnosis not present

## 2022-06-30 DIAGNOSIS — Z79899 Other long term (current) drug therapy: Secondary | ICD-10-CM | POA: Diagnosis not present

## 2022-06-30 DIAGNOSIS — J449 Chronic obstructive pulmonary disease, unspecified: Secondary | ICD-10-CM | POA: Diagnosis not present

## 2022-06-30 DIAGNOSIS — I10 Essential (primary) hypertension: Secondary | ICD-10-CM | POA: Diagnosis not present

## 2022-06-30 DIAGNOSIS — B372 Candidiasis of skin and nail: Secondary | ICD-10-CM | POA: Diagnosis not present

## 2022-06-30 DIAGNOSIS — E876 Hypokalemia: Secondary | ICD-10-CM | POA: Diagnosis not present

## 2022-06-30 DIAGNOSIS — M5481 Occipital neuralgia: Secondary | ICD-10-CM | POA: Diagnosis not present

## 2022-06-30 DIAGNOSIS — Z7982 Long term (current) use of aspirin: Secondary | ICD-10-CM | POA: Diagnosis not present

## 2022-06-30 DIAGNOSIS — D509 Iron deficiency anemia, unspecified: Secondary | ICD-10-CM | POA: Diagnosis not present

## 2022-06-30 DIAGNOSIS — Z791 Long term (current) use of non-steroidal anti-inflammatories (NSAID): Secondary | ICD-10-CM | POA: Diagnosis not present

## 2022-06-30 DIAGNOSIS — F03A4 Unspecified dementia, mild, with anxiety: Secondary | ICD-10-CM | POA: Diagnosis not present

## 2022-06-30 DIAGNOSIS — A419 Sepsis, unspecified organism: Secondary | ICD-10-CM | POA: Diagnosis not present

## 2022-06-30 DIAGNOSIS — Z556 Problems related to health literacy: Secondary | ICD-10-CM | POA: Diagnosis not present

## 2022-06-30 DIAGNOSIS — F03A3 Unspecified dementia, mild, with mood disturbance: Secondary | ICD-10-CM | POA: Diagnosis not present

## 2022-06-30 DIAGNOSIS — E875 Hyperkalemia: Secondary | ICD-10-CM | POA: Diagnosis not present

## 2022-07-05 DIAGNOSIS — Z556 Problems related to health literacy: Secondary | ICD-10-CM | POA: Diagnosis not present

## 2022-07-05 DIAGNOSIS — M542 Cervicalgia: Secondary | ICD-10-CM | POA: Diagnosis not present

## 2022-07-05 DIAGNOSIS — M5481 Occipital neuralgia: Secondary | ICD-10-CM | POA: Diagnosis not present

## 2022-07-05 DIAGNOSIS — E875 Hyperkalemia: Secondary | ICD-10-CM | POA: Diagnosis not present

## 2022-07-05 DIAGNOSIS — F03A4 Unspecified dementia, mild, with anxiety: Secondary | ICD-10-CM | POA: Diagnosis not present

## 2022-07-05 DIAGNOSIS — I2510 Atherosclerotic heart disease of native coronary artery without angina pectoris: Secondary | ICD-10-CM | POA: Diagnosis not present

## 2022-07-05 DIAGNOSIS — F03A3 Unspecified dementia, mild, with mood disturbance: Secondary | ICD-10-CM | POA: Diagnosis not present

## 2022-07-05 DIAGNOSIS — Z79899 Other long term (current) drug therapy: Secondary | ICD-10-CM | POA: Diagnosis not present

## 2022-07-05 DIAGNOSIS — J449 Chronic obstructive pulmonary disease, unspecified: Secondary | ICD-10-CM | POA: Diagnosis not present

## 2022-07-05 DIAGNOSIS — Z7982 Long term (current) use of aspirin: Secondary | ICD-10-CM | POA: Diagnosis not present

## 2022-07-05 DIAGNOSIS — Z791 Long term (current) use of non-steroidal anti-inflammatories (NSAID): Secondary | ICD-10-CM | POA: Diagnosis not present

## 2022-07-05 DIAGNOSIS — B372 Candidiasis of skin and nail: Secondary | ICD-10-CM | POA: Diagnosis not present

## 2022-07-05 DIAGNOSIS — D509 Iron deficiency anemia, unspecified: Secondary | ICD-10-CM | POA: Diagnosis not present

## 2022-07-05 DIAGNOSIS — I10 Essential (primary) hypertension: Secondary | ICD-10-CM | POA: Diagnosis not present

## 2022-07-05 DIAGNOSIS — E876 Hypokalemia: Secondary | ICD-10-CM | POA: Diagnosis not present

## 2022-07-05 DIAGNOSIS — A419 Sepsis, unspecified organism: Secondary | ICD-10-CM | POA: Diagnosis not present

## 2022-07-12 DIAGNOSIS — R519 Headache, unspecified: Secondary | ICD-10-CM | POA: Diagnosis not present

## 2022-07-12 DIAGNOSIS — M6281 Muscle weakness (generalized): Secondary | ICD-10-CM | POA: Diagnosis not present

## 2022-07-12 DIAGNOSIS — M542 Cervicalgia: Secondary | ICD-10-CM | POA: Diagnosis not present

## 2022-07-12 DIAGNOSIS — I10 Essential (primary) hypertension: Secondary | ICD-10-CM | POA: Diagnosis not present

## 2022-07-12 DIAGNOSIS — R350 Frequency of micturition: Secondary | ICD-10-CM | POA: Diagnosis not present

## 2022-07-12 DIAGNOSIS — N390 Urinary tract infection, site not specified: Secondary | ICD-10-CM | POA: Diagnosis not present

## 2022-07-12 DIAGNOSIS — D509 Iron deficiency anemia, unspecified: Secondary | ICD-10-CM | POA: Diagnosis not present

## 2022-07-12 DIAGNOSIS — B372 Candidiasis of skin and nail: Secondary | ICD-10-CM | POA: Diagnosis not present

## 2022-07-12 DIAGNOSIS — F03918 Unspecified dementia, unspecified severity, with other behavioral disturbance: Secondary | ICD-10-CM | POA: Diagnosis not present

## 2022-07-12 DIAGNOSIS — M5481 Occipital neuralgia: Secondary | ICD-10-CM | POA: Diagnosis not present

## 2022-07-13 DIAGNOSIS — E875 Hyperkalemia: Secondary | ICD-10-CM | POA: Diagnosis not present

## 2022-07-13 DIAGNOSIS — F03A4 Unspecified dementia, mild, with anxiety: Secondary | ICD-10-CM | POA: Diagnosis not present

## 2022-07-13 DIAGNOSIS — Z7982 Long term (current) use of aspirin: Secondary | ICD-10-CM | POA: Diagnosis not present

## 2022-07-13 DIAGNOSIS — E876 Hypokalemia: Secondary | ICD-10-CM | POA: Diagnosis not present

## 2022-07-13 DIAGNOSIS — M5481 Occipital neuralgia: Secondary | ICD-10-CM | POA: Diagnosis not present

## 2022-07-13 DIAGNOSIS — Z791 Long term (current) use of non-steroidal anti-inflammatories (NSAID): Secondary | ICD-10-CM | POA: Diagnosis not present

## 2022-07-13 DIAGNOSIS — M542 Cervicalgia: Secondary | ICD-10-CM | POA: Diagnosis not present

## 2022-07-13 DIAGNOSIS — Z556 Problems related to health literacy: Secondary | ICD-10-CM | POA: Diagnosis not present

## 2022-07-13 DIAGNOSIS — B372 Candidiasis of skin and nail: Secondary | ICD-10-CM | POA: Diagnosis not present

## 2022-07-13 DIAGNOSIS — J449 Chronic obstructive pulmonary disease, unspecified: Secondary | ICD-10-CM | POA: Diagnosis not present

## 2022-07-13 DIAGNOSIS — D509 Iron deficiency anemia, unspecified: Secondary | ICD-10-CM | POA: Diagnosis not present

## 2022-07-13 DIAGNOSIS — Z79899 Other long term (current) drug therapy: Secondary | ICD-10-CM | POA: Diagnosis not present

## 2022-07-13 DIAGNOSIS — F03A3 Unspecified dementia, mild, with mood disturbance: Secondary | ICD-10-CM | POA: Diagnosis not present

## 2022-07-13 DIAGNOSIS — I10 Essential (primary) hypertension: Secondary | ICD-10-CM | POA: Diagnosis not present

## 2022-07-13 DIAGNOSIS — A419 Sepsis, unspecified organism: Secondary | ICD-10-CM | POA: Diagnosis not present

## 2022-07-13 DIAGNOSIS — I2510 Atherosclerotic heart disease of native coronary artery without angina pectoris: Secondary | ICD-10-CM | POA: Diagnosis not present

## 2022-07-14 ENCOUNTER — Other Ambulatory Visit: Payer: Self-pay

## 2022-07-14 ENCOUNTER — Emergency Department (HOSPITAL_COMMUNITY): Payer: Medicare Other

## 2022-07-14 ENCOUNTER — Other Ambulatory Visit (HOSPITAL_COMMUNITY): Payer: Self-pay | Admitting: Family Medicine

## 2022-07-14 ENCOUNTER — Encounter (HOSPITAL_COMMUNITY): Payer: Self-pay

## 2022-07-14 ENCOUNTER — Inpatient Hospital Stay (HOSPITAL_COMMUNITY)
Admission: EM | Admit: 2022-07-14 | Discharge: 2022-07-18 | DRG: 193 | Disposition: A | Discharge status: Skilled Nursing Facility | Payer: Medicare Other | Attending: Family Medicine | Admitting: Family Medicine

## 2022-07-14 ENCOUNTER — Inpatient Hospital Stay (HOSPITAL_COMMUNITY): Payer: Medicare Other

## 2022-07-14 DIAGNOSIS — G3184 Mild cognitive impairment, so stated: Secondary | ICD-10-CM | POA: Diagnosis not present

## 2022-07-14 DIAGNOSIS — F419 Anxiety disorder, unspecified: Secondary | ICD-10-CM | POA: Diagnosis present

## 2022-07-14 DIAGNOSIS — D649 Anemia, unspecified: Secondary | ICD-10-CM | POA: Diagnosis present

## 2022-07-14 DIAGNOSIS — Z7982 Long term (current) use of aspirin: Secondary | ICD-10-CM | POA: Diagnosis not present

## 2022-07-14 DIAGNOSIS — Z79899 Other long term (current) drug therapy: Secondary | ICD-10-CM

## 2022-07-14 DIAGNOSIS — I25119 Atherosclerotic heart disease of native coronary artery with unspecified angina pectoris: Secondary | ICD-10-CM | POA: Diagnosis present

## 2022-07-14 DIAGNOSIS — Z888 Allergy status to other drugs, medicaments and biological substances status: Secondary | ICD-10-CM

## 2022-07-14 DIAGNOSIS — R262 Difficulty in walking, not elsewhere classified: Secondary | ICD-10-CM | POA: Diagnosis not present

## 2022-07-14 DIAGNOSIS — Z880 Allergy status to penicillin: Secondary | ICD-10-CM | POA: Diagnosis not present

## 2022-07-14 DIAGNOSIS — Z743 Need for continuous supervision: Secondary | ICD-10-CM | POA: Diagnosis not present

## 2022-07-14 DIAGNOSIS — F03A3 Unspecified dementia, mild, with mood disturbance: Secondary | ICD-10-CM | POA: Diagnosis present

## 2022-07-14 DIAGNOSIS — J440 Chronic obstructive pulmonary disease with acute lower respiratory infection: Secondary | ICD-10-CM | POA: Diagnosis present

## 2022-07-14 DIAGNOSIS — R011 Cardiac murmur, unspecified: Secondary | ICD-10-CM | POA: Diagnosis present

## 2022-07-14 DIAGNOSIS — J449 Chronic obstructive pulmonary disease, unspecified: Secondary | ICD-10-CM | POA: Diagnosis present

## 2022-07-14 DIAGNOSIS — J42 Unspecified chronic bronchitis: Secondary | ICD-10-CM | POA: Diagnosis not present

## 2022-07-14 DIAGNOSIS — Z515 Encounter for palliative care: Secondary | ICD-10-CM | POA: Diagnosis not present

## 2022-07-14 DIAGNOSIS — R531 Weakness: Secondary | ICD-10-CM | POA: Diagnosis not present

## 2022-07-14 DIAGNOSIS — M1990 Unspecified osteoarthritis, unspecified site: Secondary | ICD-10-CM | POA: Diagnosis present

## 2022-07-14 DIAGNOSIS — I10 Essential (primary) hypertension: Secondary | ICD-10-CM | POA: Diagnosis present

## 2022-07-14 DIAGNOSIS — Z1152 Encounter for screening for COVID-19: Secondary | ICD-10-CM

## 2022-07-14 DIAGNOSIS — Z8673 Personal history of transient ischemic attack (TIA), and cerebral infarction without residual deficits: Secondary | ICD-10-CM

## 2022-07-14 DIAGNOSIS — F339 Major depressive disorder, recurrent, unspecified: Secondary | ICD-10-CM | POA: Diagnosis present

## 2022-07-14 DIAGNOSIS — I6523 Occlusion and stenosis of bilateral carotid arteries: Secondary | ICD-10-CM | POA: Diagnosis not present

## 2022-07-14 DIAGNOSIS — H918X2 Other specified hearing loss, left ear: Secondary | ICD-10-CM | POA: Diagnosis not present

## 2022-07-14 DIAGNOSIS — K219 Gastro-esophageal reflux disease without esophagitis: Secondary | ICD-10-CM | POA: Diagnosis not present

## 2022-07-14 DIAGNOSIS — G9341 Metabolic encephalopathy: Secondary | ICD-10-CM | POA: Diagnosis present

## 2022-07-14 DIAGNOSIS — J9601 Acute respiratory failure with hypoxia: Secondary | ICD-10-CM

## 2022-07-14 DIAGNOSIS — H353114 Nonexudative age-related macular degeneration, right eye, advanced atrophic with subfoveal involvement: Secondary | ICD-10-CM | POA: Diagnosis not present

## 2022-07-14 DIAGNOSIS — I252 Old myocardial infarction: Secondary | ICD-10-CM

## 2022-07-14 DIAGNOSIS — G40109 Localization-related (focal) (partial) symptomatic epilepsy and epileptic syndromes with simple partial seizures, not intractable, without status epilepticus: Secondary | ICD-10-CM

## 2022-07-14 DIAGNOSIS — I119 Hypertensive heart disease without heart failure: Secondary | ICD-10-CM | POA: Diagnosis present

## 2022-07-14 DIAGNOSIS — J189 Pneumonia, unspecified organism: Secondary | ICD-10-CM | POA: Diagnosis not present

## 2022-07-14 DIAGNOSIS — E871 Hypo-osmolality and hyponatremia: Secondary | ICD-10-CM | POA: Diagnosis not present

## 2022-07-14 DIAGNOSIS — Z66 Do not resuscitate: Secondary | ICD-10-CM | POA: Diagnosis not present

## 2022-07-14 DIAGNOSIS — E876 Hypokalemia: Secondary | ICD-10-CM | POA: Diagnosis present

## 2022-07-14 DIAGNOSIS — R498 Other voice and resonance disorders: Secondary | ICD-10-CM | POA: Diagnosis not present

## 2022-07-14 DIAGNOSIS — Z96653 Presence of artificial knee joint, bilateral: Secondary | ICD-10-CM | POA: Diagnosis present

## 2022-07-14 DIAGNOSIS — H353221 Exudative age-related macular degeneration, left eye, with active choroidal neovascularization: Secondary | ICD-10-CM | POA: Diagnosis not present

## 2022-07-14 DIAGNOSIS — Z885 Allergy status to narcotic agent status: Secondary | ICD-10-CM | POA: Diagnosis not present

## 2022-07-14 DIAGNOSIS — Z91012 Allergy to eggs: Secondary | ICD-10-CM

## 2022-07-14 DIAGNOSIS — R918 Other nonspecific abnormal finding of lung field: Secondary | ICD-10-CM | POA: Diagnosis not present

## 2022-07-14 DIAGNOSIS — Z90710 Acquired absence of both cervix and uterus: Secondary | ICD-10-CM

## 2022-07-14 DIAGNOSIS — I350 Nonrheumatic aortic (valve) stenosis: Secondary | ICD-10-CM | POA: Diagnosis present

## 2022-07-14 DIAGNOSIS — R059 Cough, unspecified: Secondary | ICD-10-CM | POA: Diagnosis not present

## 2022-07-14 DIAGNOSIS — R519 Headache, unspecified: Secondary | ICD-10-CM | POA: Diagnosis not present

## 2022-07-14 DIAGNOSIS — Z8674 Personal history of sudden cardiac arrest: Secondary | ICD-10-CM

## 2022-07-14 DIAGNOSIS — F03A2 Unspecified dementia, mild, with psychotic disturbance: Secondary | ICD-10-CM | POA: Diagnosis not present

## 2022-07-14 DIAGNOSIS — M6281 Muscle weakness (generalized): Secondary | ICD-10-CM | POA: Diagnosis not present

## 2022-07-14 DIAGNOSIS — M159 Polyosteoarthritis, unspecified: Secondary | ICD-10-CM | POA: Diagnosis not present

## 2022-07-14 DIAGNOSIS — R488 Other symbolic dysfunctions: Secondary | ICD-10-CM | POA: Diagnosis not present

## 2022-07-14 DIAGNOSIS — E785 Hyperlipidemia, unspecified: Secondary | ICD-10-CM | POA: Diagnosis not present

## 2022-07-14 DIAGNOSIS — R2681 Unsteadiness on feet: Secondary | ICD-10-CM | POA: Diagnosis not present

## 2022-07-14 DIAGNOSIS — R0902 Hypoxemia: Secondary | ICD-10-CM | POA: Diagnosis not present

## 2022-07-14 DIAGNOSIS — I700 Atherosclerosis of aorta: Secondary | ICD-10-CM | POA: Diagnosis not present

## 2022-07-14 DIAGNOSIS — H353222 Exudative age-related macular degeneration, left eye, with inactive choroidal neovascularization: Secondary | ICD-10-CM | POA: Diagnosis not present

## 2022-07-14 DIAGNOSIS — Z955 Presence of coronary angioplasty implant and graft: Secondary | ICD-10-CM

## 2022-07-14 DIAGNOSIS — R9431 Abnormal electrocardiogram [ECG] [EKG]: Secondary | ICD-10-CM | POA: Diagnosis not present

## 2022-07-14 DIAGNOSIS — I6782 Cerebral ischemia: Secondary | ICD-10-CM | POA: Diagnosis not present

## 2022-07-14 LAB — BASIC METABOLIC PANEL
Anion gap: 10 (ref 5–15)
BUN: 23 mg/dL (ref 8–23)
CO2: 27 mmol/L (ref 22–32)
Calcium: 9.3 mg/dL (ref 8.9–10.3)
Chloride: 91 mmol/L — ABNORMAL LOW (ref 98–111)
Creatinine, Ser: 0.64 mg/dL (ref 0.44–1.00)
GFR, Estimated: >60 mL/min (ref >60)
Glucose, Bld: 126 mg/dL — ABNORMAL HIGH (ref 70–99)
Potassium: 4.5 mmol/L (ref 3.5–5.1)
Sodium: 128 mmol/L — ABNORMAL LOW (ref 135–145)

## 2022-07-14 LAB — CBC WITH DIFFERENTIAL/PLATELET
Abs Immature Granulocytes: 0.08 10*3/uL — ABNORMAL HIGH (ref 0.00–0.07)
Basophils Absolute: 0 10*3/uL (ref 0.0–0.1)
Basophils Relative: 0 %
Eosinophils Absolute: 0.1 10*3/uL (ref 0.0–0.5)
Eosinophils Relative: 1 %
HCT: 34.9 % — ABNORMAL LOW (ref 36.0–46.0)
Hemoglobin: 11.3 g/dL — ABNORMAL LOW (ref 12.0–15.0)
Immature Granulocytes: 1 %
Lymphocytes Relative: 12 %
Lymphs Abs: 1.7 10*3/uL (ref 0.7–4.0)
MCH: 29 pg (ref 26.0–34.0)
MCHC: 32.4 g/dL (ref 30.0–36.0)
MCV: 89.5 fL (ref 80.0–100.0)
Monocytes Absolute: 1.5 10*3/uL — ABNORMAL HIGH (ref 0.1–1.0)
Monocytes Relative: 11 %
Neutro Abs: 10.6 10*3/uL — ABNORMAL HIGH (ref 1.7–7.7)
Neutrophils Relative %: 75 %
Platelets: 335 10*3/uL (ref 150–400)
RBC: 3.9 MIL/uL (ref 3.87–5.11)
RDW: 15.3 % (ref 11.5–15.5)
WBC: 14 10*3/uL — ABNORMAL HIGH (ref 4.0–10.5)
nRBC: 0 % (ref 0.0–0.2)

## 2022-07-14 LAB — MRSA NEXT GEN BY PCR, NASAL: MRSA by PCR Next Gen: NOT DETECTED

## 2022-07-14 LAB — SARS CORONAVIRUS 2 BY RT PCR: SARS Coronavirus 2 by RT PCR: NEGATIVE

## 2022-07-14 LAB — BRAIN NATRIURETIC PEPTIDE: B Natriuretic Peptide: 154 pg/mL — ABNORMAL HIGH (ref 0.0–100.0)

## 2022-07-14 MED ORDER — ASPIRIN 81 MG PO CHEW
81.0000 mg | CHEWABLE_TABLET | Freq: Every day | ORAL | Status: DC
  Administered 2022-07-15 – 2022-07-18 (×4): 81 mg via ORAL
  Filled 2022-07-14 (×4): qty 1

## 2022-07-14 MED ORDER — ONDANSETRON HCL 4 MG/2ML IJ SOLN: 4.0000 mg | Freq: Four times a day (QID) | INTRAMUSCULAR | Status: DC | PRN

## 2022-07-14 MED ORDER — MEMANTINE HCL 10 MG PO TABS
5.0000 mg | ORAL_TABLET | Freq: Two times a day (BID) | ORAL | Status: DC
  Administered 2022-07-14 – 2022-07-18 (×8): 5 mg via ORAL
  Filled 2022-07-14 (×9): qty 1

## 2022-07-14 MED ORDER — NYSTATIN 100000 UNIT/GM EX POWD
Freq: Two times a day (BID) | CUTANEOUS | Status: DC
  Administered 2022-07-16: 1 via TOPICAL
  Filled 2022-07-14: qty 15

## 2022-07-14 MED ORDER — AMLODIPINE BESYLATE 5 MG PO TABS
10.0000 mg | ORAL_TABLET | Freq: Every day | ORAL | Status: DC
  Administered 2022-07-15 – 2022-07-18 (×4): 10 mg via ORAL
  Filled 2022-07-14 (×5): qty 2

## 2022-07-14 MED ORDER — SODIUM CHLORIDE 0.9 % IV SOLN
500.0000 mg | Freq: Once | INTRAVENOUS | Status: AC
  Administered 2022-07-14: 500 mg via INTRAVENOUS
  Filled 2022-07-14: qty 5

## 2022-07-14 MED ORDER — LEVETIRACETAM 500 MG PO TABS
500.0000 mg | ORAL_TABLET | Freq: Two times a day (BID) | ORAL | Status: DC
  Administered 2022-07-14 – 2022-07-18 (×8): 500 mg via ORAL
  Filled 2022-07-14 (×8): qty 1

## 2022-07-14 MED ORDER — POLYETHYLENE GLYCOL 3350 17 G PO PACK: 17.0000 g | PACK | Freq: Every day | ORAL | Status: DC | PRN

## 2022-07-14 MED ORDER — RISPERIDONE 0.5 MG PO TABS
0.5000 mg | ORAL_TABLET | Freq: Two times a day (BID) | ORAL | Status: DC
  Administered 2022-07-14 – 2022-07-18 (×8): 0.5 mg via ORAL
  Filled 2022-07-14 (×8): qty 1

## 2022-07-14 MED ORDER — PANTOPRAZOLE SODIUM 40 MG PO TBEC
40.0000 mg | DELAYED_RELEASE_TABLET | Freq: Every day | ORAL | Status: DC
  Administered 2022-07-15 – 2022-07-18 (×4): 40 mg via ORAL
  Filled 2022-07-14 (×4): qty 1

## 2022-07-14 MED ORDER — SODIUM CHLORIDE 0.9 % IV SOLN
1.0000 g | Freq: Once | INTRAVENOUS | Status: AC
  Administered 2022-07-14: 1 g via INTRAVENOUS
  Filled 2022-07-14: qty 10

## 2022-07-14 MED ORDER — IPRATROPIUM-ALBUTEROL 0.5-2.5 (3) MG/3ML IN SOLN
3.0000 mL | RESPIRATORY_TRACT | Status: DC | PRN
  Administered 2022-07-15: 3 mL via RESPIRATORY_TRACT
  Filled 2022-07-14: qty 3

## 2022-07-14 MED ORDER — ENOXAPARIN SODIUM 40 MG/0.4ML IJ SOSY
40.0000 mg | PREFILLED_SYRINGE | INTRAMUSCULAR | Status: DC
  Administered 2022-07-14 – 2022-07-17 (×4): 40 mg via SUBCUTANEOUS
  Filled 2022-07-14 (×4): qty 0.4

## 2022-07-14 MED ORDER — ORAL CARE MOUTH RINSE: 15.0000 mL | OROMUCOSAL | Status: DC | PRN

## 2022-07-14 MED ORDER — SODIUM CHLORIDE 0.9 % IV SOLN
500.0000 mg | INTRAVENOUS | Status: DC
  Administered 2022-07-15 – 2022-07-17 (×3): 500 mg via INTRAVENOUS
  Filled 2022-07-14 (×3): qty 5

## 2022-07-14 MED ORDER — SODIUM CHLORIDE 0.9 % IV BOLUS
500.0000 mL | Freq: Once | INTRAVENOUS | Status: AC
  Administered 2022-07-14: 500 mL via INTRAVENOUS

## 2022-07-14 MED ORDER — ALPRAZOLAM 0.5 MG PO TABS
0.5000 mg | ORAL_TABLET | Freq: Every evening | ORAL | Status: DC | PRN
  Administered 2022-07-15 – 2022-07-17 (×4): 0.5 mg via ORAL
  Filled 2022-07-14 (×4): qty 1

## 2022-07-14 MED ORDER — GUAIFENESIN-DM 100-10 MG/5ML PO SYRP
15.0000 mL | ORAL_SOLUTION | Freq: Three times a day (TID) | ORAL | Status: AC
  Administered 2022-07-14 – 2022-07-15 (×3): 15 mL via ORAL
  Filled 2022-07-14 (×3): qty 15

## 2022-07-14 MED ORDER — SODIUM CHLORIDE 0.9 % IV SOLN
2.0000 g | INTRAVENOUS | Status: DC
  Administered 2022-07-15 – 2022-07-17 (×3): 2 g via INTRAVENOUS
  Filled 2022-07-14 (×3): qty 20

## 2022-07-14 MED ORDER — ATORVASTATIN CALCIUM 40 MG PO TABS
80.0000 mg | ORAL_TABLET | Freq: Every day | ORAL | Status: DC
  Administered 2022-07-15 – 2022-07-17 (×3): 80 mg via ORAL
  Filled 2022-07-14 (×3): qty 2

## 2022-07-14 MED ORDER — ACETAMINOPHEN 325 MG PO TABS
650.0000 mg | ORAL_TABLET | Freq: Four times a day (QID) | ORAL | Status: DC | PRN
  Administered 2022-07-14 – 2022-07-18 (×8): 650 mg via ORAL
  Filled 2022-07-14 (×8): qty 2

## 2022-07-14 MED ORDER — ACETAMINOPHEN 650 MG RE SUPP: 650.0000 mg | Freq: Four times a day (QID) | RECTAL | Status: DC | PRN

## 2022-07-14 MED ORDER — CARVEDILOL 3.125 MG PO TABS
6.2500 mg | ORAL_TABLET | Freq: Two times a day (BID) | ORAL | Status: DC
  Administered 2022-07-15 – 2022-07-18 (×7): 6.25 mg via ORAL
  Filled 2022-07-14 (×7): qty 2

## 2022-07-14 MED ORDER — SODIUM CHLORIDE 0.9 % IV SOLN: INTRAVENOUS | Status: AC

## 2022-07-14 MED ORDER — ONDANSETRON HCL 4 MG PO TABS: 4.0000 mg | ORAL_TABLET | Freq: Four times a day (QID) | ORAL | Status: DC | PRN

## 2022-07-14 MED ORDER — IPRATROPIUM-ALBUTEROL 0.5-2.5 (3) MG/3ML IN SOLN
3.0000 mL | Freq: Once | RESPIRATORY_TRACT | Status: AC
  Administered 2022-07-14: 3 mL via RESPIRATORY_TRACT
  Filled 2022-07-14: qty 3

## 2022-07-14 NOTE — H&P (Addendum)
History and Physical    Beverly [name]:[id number] DOB: 14-Jan-1932 DOA: 07/14/2022  PCP: Benita Stabile, MD   Patient coming from: Home  I have personally briefly reviewed patient's old medical records in [facility]  Chief Complaint: Weakness, cough  HPI: Beverly [name] is a 89 y.o. female with medical history significant for STEMI, Cardiac arrest, COPD, coronary artery disease, subdural hematoma, hypertension, seizures. Patient was brought to the ED via EMS with reports of weakness, cough, headache, over the past several days. She went to see her primary care provider last week, and she was told to come to the ED today, and that her O2 sats had been low.  Reports pain to the right upper side of her head, and headaches, she has not had any falls.  There is no lesion or bumps to her head. On my evaluation patient is awake alert and able to answer questions.  She denies chest pain, no abdominal pain, no urinary symptoms.  Reports some confused speech over the past week.  She has had poor oral intake.  No lower extremity swelling.  ED Course: Tmax 98.3.  Heart rate 70s to 80s.  Respiratory 20.  Blood pressure systolic 130s to 956O.  O2 sats 91% on 3 L nasal cannula. WBC 14.  Sodium 128.  BNP 154.  Chest x-ray showing cardiomegaly, bilateral upper lobe opacities-edema or infection. IV ceftriaxone and azithromycin started. 500 mill bolus given.  Review of Systems: As per HPI all other systems reviewed and negative.  Past Medical History:  Diagnosis Date   Anxiety    Arthritis    COPD (chronic obstructive pulmonary disease) (HCC)    Coronary artery disease    a. STEMI with cardiac arrest (polymorphic VT) s/p DES to RCA.   GERD (gastroesophageal reflux disease)    Headache(784.0)    Hearing loss, central    Left ear   Hematoma    Hypertension    Memory changes    Mild aortic stenosis    Mild carotid artery disease (HCC)    a. 1-39% by duplex 02/2018.   Seizures (HCC) 10/2012    No seizures, speech issues; after a fall, hitting head on left side   UTI (urinary tract infection)    June 2022    Past Surgical History:  Procedure Laterality Date   ABDOMINAL HYSTERECTOMY     APPENDECTOMY     CARDIAC CATHETERIZATION     15 yrs ago   CORONARY/GRAFT ACUTE MI REVASCULARIZATION N/A 01/10/2018   Procedure: Coronary/Graft Acute MI Revascularization;  Surgeon: Runell Gess, MD;  Location: [facility] INVASIVE CV LAB;  Service: Cardiovascular;  Laterality: N/A;   CRANIOTOMY Left 10/30/2012   Procedure: CRANIOTOMY HEMATOMA EVACUATION SUBDURAL;  Surgeon: Maeola Harman, MD;  Location: [facility] NEURO ORS;  Service: Neurosurgery;  Laterality: Left;  Left Craniotomy for evacuation of subdural hematoma   JOINT REPLACEMENT Bilateral    knees   LEFT HEART CATH AND CORONARY ANGIOGRAPHY N/A 01/10/2018   Procedure: LEFT HEART CATH AND CORONARY ANGIOGRAPHY;  Surgeon: Runell Gess, MD;  Location: [facility] INVASIVE CV LAB;  Service: Cardiovascular;  Laterality: N/A;     reports that she has never smoked. She has never used smokeless tobacco. She reports that she does not drink alcohol and does not use drugs.  Allergies  Allergen Reactions   Lisinopril Swelling   Egg-Derived Products Itching    Patient reports that she can eat eggs but does not tolerate egg derived products  Morphine And Codeine     Headache   Penicillins Itching    DID THE REACTION INVOLVE: Swelling of the face/tongue/throat, SOB, or low BP? No Sudden or severe rash/hives, skin peeling, or the inside of the mouth or nose? No Did it require medical treatment? No When did it last happen? Unknown If all above answers are "NO", may proceed with cephalosporin use.     Family History  Problem Relation Age of Onset   Dementia Mother    Cancer Father    Dementia Maternal Aunt    Ataxia Neg Hx    Chorea Neg Hx    Mental retardation Neg Hx    Migraines Neg Hx    Multiple sclerosis Neg Hx    Neurofibromatosis Neg Hx     Neuropathy Neg Hx    Parkinsonism Neg Hx    Seizures Neg Hx    Stroke Neg Hx     Prior to Admission medications   Medication Sig Start Date End Date Taking? Authorizing Provider  ALPRAZolam Prudy Feeler) 0.5 MG tablet Take 1 tablet (0.5 mg total) by mouth at bedtime as needed for anxiety. 12/08/21  Yes Sharee Holster, NP  amLODipine (NORVASC) 5 MG tablet Take 1 tablet (5 mg total) by mouth daily. Patient taking differently: Take 10 mg by mouth daily. 12/08/21  Yes Sharee Holster, NP  aspirin 81 MG chewable tablet Chew 1 tablet (81 mg total) by mouth daily. 01/13/18  Yes Georgie Chard D, NP  atorvastatin (LIPITOR) 80 MG tablet Take 1 tablet (80 mg total) by mouth daily at 6 PM. 12/08/21  Yes Sharee Holster, NP  carvedilol (COREG) 6.25 MG tablet TAKE ONE TABLET (6.25MG  TOTAL) BY MOUTH TWO TIMES DAILY 12/08/21  Yes Sharee Holster, NP  celecoxib (CELEBREX) 200 MG capsule Take 200 mg by mouth daily.   Yes [provider]  Cholecalciferol 25 MCG (1000 UT) TBDP Take 1 tablet by mouth daily.   Yes [provider]  clotrimazole (MYCELEX) 10 MG troche Take 10 mg by mouth 5 (five) times daily.   Yes [provider]  dicyclomine (BENTYL) 20 MG tablet Take 1 tablet (20 mg total) by mouth in the morning and at bedtime. 12/08/21  Yes Sharee Holster, NP  guaiFENesin (ROBITUSSIN) 100 MG/5ML liquid Take 5 mLs by mouth every 4 (four) hours as needed for cough or to loosen phlegm. 11/23/21  Yes Shah, Pratik D, DO  HYDROcodone-acetaminophen (NORCO) 10-325 MG tablet Take 1 tablet by mouth every 6 (six) hours as needed.   Yes [provider]  latanoprost (XALATAN) 0.005 % ophthalmic solution INSTILL ONE DROP IN BOTH EYES NIGHTLY 12/08/21  Yes Sharee Holster, NP  levETIRAcetam (KEPPRA) 500 MG tablet Take 500 mg by mouth 2 (two) times daily.   Yes [provider]  memantine (NAMENDA) 5 MG tablet Take 1 tablet (5 mg total) by mouth 2 (two) times daily. 12/08/21  Yes Sharee Holster, NP  omeprazole (PRILOSEC) 20 MG capsule Take 1 capsule (20 mg total) by mouth 2 (two) times daily before a meal. 12/08/21  Yes Sharee Holster, NP  pantoprazole (PROTONIX) 40 MG tablet Take 40 mg by mouth daily.   Yes [provider]  risperiDONE (RISPERDAL) 0.5 MG tablet Take 1 tablet (0.5 mg total) by mouth 2 (two) times daily. 12/08/21  Yes Sharee Holster, NP  acetaminophen (TYLENOL) 500 MG tablet Take 500 mg by mouth every 8 (eight) hours as needed.  [provider]  levETIRAcetam (KEPPRA) 100 MG/ML solution Take 5 mLs by mouth 2 (two) times daily.    [provider]  nitroGLYCERIN (NITROSTAT) 0.4 MG SL tablet Place 1 tablet (0.4 mg total) under the tongue every 5 (five) minutes x 3 doses as needed for chest pain. 12/08/21   Sharee Holster, NP  Nutritional Supplements (ENSURE ENLIVE PO) Take 237 mLs by mouth 2 (two) times daily.    [provider]  potassium chloride SA (KLOR-CON M) 20 MEQ tablet Take 1 tablet (20 mEq total) by mouth daily. 12/08/21   Sharee Holster, NP  Propylene Glycol (SYSTANE BALANCE OP) Place 2 drops into both eyes daily as needed (dry eyes).    [provider]  sodium chloride 1 g tablet Take 1 tablet (1 g total) by mouth daily. 12/08/21   Sharee Holster, NP  lisinopril (ZESTRIL) 20 MG tablet Take 1 tablet (20 mg total) by mouth 2 (two) times daily. 01/24/19 05/03/19  Ellsworth Lennox, PA-C    Physical Exam: Vitals:   07/14/22 1600 07/14/22 1955 07/14/22 2023  BP: 136/61 (!) 143/66   Pulse: 77 89   Resp:  20   Temp: 97.9 F (36.6 C) 98.3 F (36.8 C)   TempSrc: Oral Oral   SpO2: 95% 91%   Weight: 62.1 kg  65 kg  Height: 5' (1.524 m)      Constitutional: Lethargic, rotated to the right in bed  Vitals:   07/14/22 1600 07/14/22 1955 07/14/22 2023  BP: 136/61 (!) 143/66   Pulse: 77 89   Resp:  20   Temp: 97.9 F (36.6 C) 98.3 F (36.8 C)   TempSrc: Oral Oral   SpO2: 95% 91%   Weight: 62.1 kg   65 kg  Height: 5' (1.524 m)     Eyes: PERRL, lids and conjunctivae normal ENMT: Mucous membranes are moist.   Neck: normal, supple, no masses, no thyromegaly Respiratory: On 2 L nasal cannula, sats > 91% clear to auscultation bilaterally, no wheezing, no crackles. Normal respiratory effort. No accessory muscle use.  Cardiovascular: Regular rate and rhythm, 3/6 systolic murmurs- loudest aortic area, on rubs / gallops. No extremity edema. Extremities warm  Abdomen: no tenderness, no masses palpated. No hepatosplenomegaly. Bowel sounds positive.  Musculoskeletal: no clubbing / cyanosis. No joint deformity upper and lower extremities.  Skin: no rashes, lesions, ulcers. No induration Neurologic: No facial asymmetry, weak, but good grip strength, able to move bilower extremities equally Psychiatric: Lethargic and oriented x person and place.   Labs on Admission: I have personally reviewed following labs and imaging studies  CBC: Recent Labs  Lab 07/14/22 1702  WBC 14.0*  NEUTROABS 10.6*  HGB 11.3*  HCT 34.9*  MCV 89.5  PLT 335   Basic Metabolic Panel: Recent Labs  Lab 07/14/22 1702  NA 128*  K 4.5  CL 91*  CO2 27  GLUCOSE 126*  BUN 23  CREATININE 0.64  CALCIUM 9.3   Radiological Exams on Admission: DG Chest 2 View  Result Date: 07/14/2022 CLINICAL DATA:  Cough.  Hypoxia.  Generalized weakness. EXAM: CHEST - 2 VIEW COMPARISON:  One view chest x-ray 11/18/2021 FINDINGS: The heart is enlarged. A large hiatal hernia is present. Atherosclerotic calcifications are present at the aortic arch. Superimposed interstitial and airspace opacities are present in the upper lobes bilaterally. Chronic elevation of the right hemidiaphragm is noted. Small effusions are suspected. Degenerative changes are noted in both shoulders. IMPRESSION: 1. Cardiomegaly without  failure. 2. Superimposed interstitial and airspace opacities in the upper lobes bilaterally. This may represent edema or infection. 3.  Large hiatal hernia. Electronically Signed   By: Marin Roberts M.D.   On: 07/14/2022 17:08    EKG: Independently reviewed.  Sinus rhythm rate 79, QTc 418.  No significant change from prior.  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  Afebrile.  Rules out for sepsis.  COVID-negative.  Chest x-ray showing bilateral upper lobe opacities-edema or infection.  BNP 154, no prior to compare. -IV ceftriaxone and azithromycin -Mucolytic's - Hydrate  Acute metabolic encephalopathy Likely secondary to pneumonia.  Daughter reports confused speech over the past week.  At baseline no memory deficits.  Currently patient is awake alert able to answer questions appropriately.  But she is complaining of pain to the right side of her head.  Examination of the area is unremarkable.  She has a history of subdural hematoma. -CT Head  Acute hypoxic respiratory failure (HCC) O2 sats currently 91% on 3L.   Hyponatremia Sodium 128.  Baseline 131-134. - 500 ml bolus given, continue N/s 75cc/hr x 15hrs  Coronary artery disease with angina pectoris (HCC) History of ST elevation MI and cardiac arrest 2020, with successful PCI/DES. -Resume aspirin, statins, carvedilol  COPD (chronic obstructive pulmonary disease) (HCC) Stable.  Major depression, recurrent, chronic (HCC) Resume risperidone, Xanax 0.5 as needed at bedtime  HTN (hypertension) Stable. -Resume  carvedilol 6.25, Norvasc 5  Cardiac murmur Present on exam today.  Daughter unaware of history of cardiac murmur.  Last echo 2020 shows mild aortic stenosis.   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  DNR form on file.  ACP documents reviewed Family Communication: Daughter Beverly [name] is his DPOA, cousin also at bedside. Disposition Plan: > 2 days Consults called: None Admission status: Inpt Tele I certify that at the point of admission it is my clinical judgment that the patient will require inpatient hospital care spanning beyond 2 midnights from the point of admission due to high intensity of service, high risk for further deterioration and high frequency of surveillance required.    Author: Onnie Boer, MD 07/14/2022 9:08 PM  For on call review [email or web address].

## 2022-07-14 NOTE — Assessment & Plan Note (Signed)
Stable. -Resume carvedilol 6.25, Norvasc 5

## 2022-07-14 NOTE — Assessment & Plan Note (Signed)
O2 sats currently 91% on 3L.

## 2022-07-14 NOTE — Assessment & Plan Note (Signed)
Resume risperidone, Xanax 0.5 as needed at bedtime

## 2022-07-14 NOTE — ED Provider Notes (Signed)
[place] EMERGENCY DEPARTMENT AT [facility] Provider Note   CSN: [id number] Arrival date & time: 07/14/22  1538     History  Chief Complaint  Patient presents with   Cough    Generalized weakness     Beverly [name] is a 89 y.o. female.  Has PMH of COPD presents the ER complaining of low oxygen, fatigue ongoing for the past week.  Was seen in her PCP and had a urinalysis that was normal, also had lab work.   Due to oxygen being between 75 and 85% on room air and her not being on any supplemental O2 she is advised to come to the ER.  Patient is had a mild cough, family states they noticed today she felt warm to the touch but have not noticed a fever otherwise.  She has had some decreased mental status as far as being more sleepy than usual but has not been more confused than usual.  She does have mild dementia.   Cough      Home Medications Prior to Admission medications   Medication Sig Start Date End Date Taking? Authorizing Provider  acetaminophen (TYLENOL) 500 MG tablet Take 500 mg by mouth every 8 (eight) hours as needed.    [provider]  ALPRAZolam Prudy Feeler) 0.5 MG tablet Take 1 tablet (0.5 mg total) by mouth at bedtime as needed for anxiety. 12/08/21   Sharee Holster, NP  amLODipine (NORVASC) 5 MG tablet Take 1 tablet (5 mg total) by mouth daily. Patient taking differently: Take 10 mg by mouth daily. 12/08/21   Sharee Holster, NP  aspirin 81 MG chewable tablet Chew 1 tablet (81 mg total) by mouth daily. 01/13/18   Filbert Schilder, NP  atorvastatin (LIPITOR) 80 MG tablet Take 1 tablet (80 mg total) by mouth daily at 6 PM. 12/08/21   Chilton Si, Chong Sicilian, NP  carvedilol (COREG) 6.25 MG tablet TAKE ONE TABLET (6.25MG  TOTAL) BY MOUTH TWO TIMES DAILY 12/08/21   Sharee Holster, NP  celecoxib (CELEBREX) 200 MG capsule Take 200 mg by mouth daily.    [provider]  Cholecalciferol 25 MCG (1000 UT) TBDP Take 1 tablet by mouth daily.    [provider]  clotrimazole (MYCELEX) 10 MG troche Take 10 mg by mouth 5 (five) times daily.    [provider]  dicyclomine (BENTYL) 20 MG tablet Take 1 tablet (20 mg total) by mouth in the morning and at bedtime. 12/08/21   Sharee Holster, NP  guaiFENesin (ROBITUSSIN) 100 MG/5ML liquid Take 5 mLs by mouth every 4 (four) hours as needed for cough or to loosen phlegm. 11/23/21   Sherryll Burger, Pratik D, DO  HYDROcodone-acetaminophen (NORCO) 10-325 MG tablet Take 1 tablet by mouth every 6 (six) hours as needed.    [provider]  latanoprost (XALATAN) 0.005 % ophthalmic solution INSTILL ONE DROP IN BOTH EYES NIGHTLY 12/08/21   Sharee Holster, NP  levETIRAcetam (KEPPRA) 100 MG/ML solution Take 5 mLs by mouth 2 (two) times daily.    [provider]  levETIRAcetam (KEPPRA) 500 MG tablet Take 500 mg by mouth 2 (two) times daily.    [provider]  memantine (NAMENDA) 5 MG tablet Take 1 tablet (5 mg total) by mouth 2 (two) times daily. 12/08/21   Sharee Holster, NP  nitroGLYCERIN (NITROSTAT) 0.4 MG SL tablet Place 1 tablet (0.4 mg total) under the tongue every 5 (five) minutes x 3 doses as needed  for chest pain. 12/08/21   Sharee Holster, NP  Nutritional Supplements (ENSURE ENLIVE PO) Take 237 mLs by mouth 2 (two) times daily.    [provider]  omeprazole (PRILOSEC) 20 MG capsule Take 1 capsule (20 mg total) by mouth 2 (two) times daily before a meal. 12/08/21   Chilton Si, Chong Sicilian, NP  pantoprazole (PROTONIX) 40 MG tablet Take 40 mg by mouth daily.    [provider]  potassium chloride SA (KLOR-CON M) 20 MEQ tablet Take 1 tablet (20 mEq total) by mouth daily. 12/08/21   Sharee Holster, NP  Propylene Glycol (SYSTANE BALANCE OP) Place 2 drops into both eyes daily as needed (dry eyes).    [provider]  risperiDONE (RISPERDAL) 0.5 MG tablet Take 1 tablet (0.5 mg total) by mouth 2 (two) times daily. 12/08/21   Sharee Holster, NP  sodium chloride  1 g tablet Take 1 tablet (1 g total) by mouth daily. 12/08/21   Sharee Holster, NP  lisinopril (ZESTRIL) 20 MG tablet Take 1 tablet (20 mg total) by mouth 2 (two) times daily. 01/24/19 05/03/19  Ellsworth Lennox, PA-C      Allergies    Lisinopril, Egg-derived products, Morphine and codeine, and Penicillins    Review of Systems   Review of Systems  Respiratory:  Positive for cough.     Physical Exam Updated Vital Signs BP 136/61   Pulse 77   Temp 97.9 F (36.6 C) (Oral)   Ht 5' (1.524 m)   Wt 62.1 kg   SpO2 95%   BMI 26.74 kg/m  Physical Exam Vitals and nursing note reviewed.  Constitutional:      General: She is not in acute distress.    Appearance: She is well-developed.  HENT:     Head: Normocephalic and atraumatic.     Nose: Nose normal.     Mouth/Throat:     Mouth: Mucous membranes are moist.  Eyes:     Conjunctiva/sclera: Conjunctivae normal.  Cardiovascular:     Rate and Rhythm: Normal rate and regular rhythm.     Heart sounds: Murmur heard.  Pulmonary:     Effort: Pulmonary effort is normal. No respiratory distress.     Breath sounds: Rales present.     Comments: Rales noted on left upper and lower lung fields Abdominal:     Palpations: Abdomen is soft.     Tenderness: There is no abdominal tenderness.  Musculoskeletal:        General: No swelling or tenderness. Normal range of motion.     Cervical back: Neck supple.     Right lower leg: No edema.     Left lower leg: No edema.  Skin:    General: Skin is warm and dry.     Capillary Refill: Capillary refill takes less than 2 seconds.  Neurological:     General: No focal deficit present.     Mental Status: She is alert and oriented to person, place, and time.  Psychiatric:        Mood and Affect: Mood normal.     ED Results / Procedures / Treatments   Labs (all labs ordered are listed, but only abnormal results are displayed) Labs Reviewed  BASIC METABOLIC PANEL - Abnormal; Notable for the  following components:      Result Value   Sodium 128 (*)    Chloride 91 (*)    Glucose, Bld 126 (*)    All other components within  normal limits  BRAIN NATRIURETIC PEPTIDE - Abnormal; Notable for the following components:   B Natriuretic Peptide 154.0 (*)    All other components within normal limits  CBC WITH DIFFERENTIAL/PLATELET - Abnormal; Notable for the following components:   WBC 14.0 (*)    Hemoglobin 11.3 (*)    HCT 34.9 (*)    Neutro Abs 10.6 (*)    Monocytes Absolute 1.5 (*)    Abs Immature Granulocytes 0.08 (*)    All other components within normal limits  SARS CORONAVIRUS 2 BY RT PCR    EKG EKG Interpretation Date/Time:  Thursday July 14 2022 16:08:30 EDT Ventricular Rate:  79 PR Interval:  207 QRS Duration:  90 QT Interval:  364 QTC Calculation: 418 R Axis:   60  Text Interpretation: Sinus rhythm Probable left atrial enlargement Confirmed by Vivi Barrack [phone]) on 07/14/2022 5:16:49 PM  Radiology DG Chest 2 View  Result Date: 07/14/2022 CLINICAL DATA:  Cough.  Hypoxia.  Generalized weakness. EXAM: CHEST - 2 VIEW COMPARISON:  One view chest x-ray 11/18/2021 FINDINGS: The heart is enlarged. A large hiatal hernia is present. Atherosclerotic calcifications are present at the aortic arch. Superimposed interstitial and airspace opacities are present in the upper lobes bilaterally. Chronic elevation of the right hemidiaphragm is noted. Small effusions are suspected. Degenerative changes are noted in both shoulders. IMPRESSION: 1. Cardiomegaly without failure. 2. Superimposed interstitial and airspace opacities in the upper lobes bilaterally. This may represent edema or infection. 3. Large hiatal hernia. Electronically Signed   By: Marin Roberts M.D.   On: 07/14/2022 17:08    Procedures Procedures    Medications Ordered in ED Medications  ipratropium-albuterol (DUONEB) 0.5-2.5 (3) MG/3ML nebulizer solution 3 mL (3 mLs Nebulization Given 07/14/22 1706)   cefTRIAXone (ROCEPHIN) 1 g in sodium chloride 0.9 % 100 mL IVPB (1 g Intravenous New Bag/Given 07/14/22 1806)  azithromycin (ZITHROMAX) 500 mg in sodium chloride 0.9 % 250 mL IVPB (500 mg Intravenous New Bag/Given 07/14/22 1807)  sodium chloride 0.9 % bolus 500 mL (500 mLs Intravenous New Bag/Given 07/14/22 1806)    ED Course/ Medical Decision Making/ A&P Clinical Course as of 07/14/22 1907  Thu Jul 14, 2022  1635 SpO2: 95 % [CB]    Clinical Course User Index [CB] Ma Rings, PA-C                             Medical Decision Making This patient presents to the ED for concern of cough, decreasd alertness, hypoxia, this involves an extensive number of treatment options, and is a complaint that carries with it a high risk of complications and morbidity.  The differential diagnosis includes pneumonia, PE, PD exacerbation, other   Co morbidities that complicate the patient evaluation :   COPD   Additional history obtained:  Additional history obtained from EMR External records from outside source obtained and reviewed including prior notes, prior labs   Lab Tests:  I Ordered, and personally interpreted labs.  The pertinent results include: CBC shows leukocytosis of 14, mild anemia at 11.3, BMP shows hyponatremia at 128, chloride decreased at 91, BNP 154, negative COVID   Imaging Studies ordered:  I ordered imaging studies including this x-ray shows bilateral upper lobe infiltrates I independently visualized and interpreted imaging  I agree with the radiologist interpretation   Cardiac Monitoring: / EKG:  The patient was maintained on a cardiac monitor.  I personally viewed and interpreted the  cardiac monitored which showed an underlying rhythm of: Sinus rhythm   Consultations Obtained:  I requested consultation with the close Dr. Mariea Clonts,  and discussed lab and imaging findings as well as pertinent plan - they recommend: Admit for IV antibiotics for pneumonia with  hypoxia   Problem List / ED Course / Critical interventions / Medication management  Patient here with cough, subjective fever at home and low oxygen, more fatigued than usual.  Ongoing for the past couple of days.  She has rales on her left side on exam.  She was reportedly saturating 85% prior to putting on 2 L now is 95% on room air.  Chest x-ray shows bilateral upper lobe infiltrates, she has a leukocytosis.  She is given Rocephin and Zithromax.  Discussed with Dr. Mariea Clonts as above for admission.  I have reviewed the patients home medicines and have made adjustments as needed        Amount and/or Complexity of Data Reviewed Labs: ordered. Radiology: ordered.  Risk Prescription drug management. Decision regarding hospitalization.           Final Clinical Impression(s) / ED Diagnoses Final diagnoses:  Pneumonia of both upper lobes due to infectious organism  Acute respiratory failure with hypoxia [facility])    Rx / DC Orders ED Discharge Orders     None         Josem Kaufmann 07/14/22 1907    Glendora Score, MD 07/14/22 2221

## 2022-07-14 NOTE — Assessment & Plan Note (Addendum)
Likely secondary to pneumonia.  Daughter reports confused speech over the past week.  At baseline no memory deficits.  Currently patient is awake alert able to answer questions appropriately.  But she is complaining of pain to the right side of her head.  Examination of the area is unremarkable.  She has a history of subdural hematoma. -CT Head

## 2022-07-14 NOTE — Assessment & Plan Note (Addendum)
History of ST elevation MI and cardiac arrest 2020, with successful PCI/DES. -Resume aspirin, statins, carvedilol

## 2022-07-14 NOTE — Assessment & Plan Note (Signed)
Resume Keppra 

## 2022-07-14 NOTE — Assessment & Plan Note (Signed)
Sodium 128.  Baseline 131-134. - 500 ml bolus given, continue N/s 75cc/hr x 15hrs

## 2022-07-14 NOTE — ED Triage Notes (Addendum)
Pt brought in from home via EMS from home, lives with daughter who called ems c/o generalized weakness, pt reports cough, headache and fatigue for past few days. Pt alert and oriented. Pt is 95 % on 2L oxygen, normally does not where O2, but has hx of COPD

## 2022-07-14 NOTE — Assessment & Plan Note (Signed)
Present on exam today.  Daughter unaware of history of cardiac murmur.  Last echo 2020 shows mild aortic stenosis.

## 2022-07-14 NOTE — Assessment & Plan Note (Addendum)
Presenting with weakness, cough, hypoxia, leukocytosis of 14.  Afebrile.  Rules out for sepsis.  COVID-negative.  Chest x-ray showing bilateral upper lobe opacities-edema or infection.  BNP 154, no prior to compare. -IV ceftriaxone and azithromycin -Mucolytic's - Hydrate

## 2022-07-14 NOTE — Assessment & Plan Note (Signed)
Stable

## 2022-07-15 DIAGNOSIS — J9601 Acute respiratory failure with hypoxia: Secondary | ICD-10-CM | POA: Diagnosis not present

## 2022-07-15 DIAGNOSIS — J189 Pneumonia, unspecified organism: Secondary | ICD-10-CM | POA: Diagnosis not present

## 2022-07-15 LAB — BASIC METABOLIC PANEL
Anion gap: 11 (ref 5–15)
BUN: 12 mg/dL (ref 8–23)
CO2: 27 mmol/L (ref 22–32)
Calcium: 9.3 mg/dL (ref 8.9–10.3)
Chloride: 94 mmol/L — ABNORMAL LOW (ref 98–111)
Creatinine, Ser: 0.51 mg/dL (ref 0.44–1.00)
GFR, Estimated: >60 mL/min (ref >60)
Glucose, Bld: 111 mg/dL — ABNORMAL HIGH (ref 70–99)
Potassium: 3.8 mmol/L (ref 3.5–5.1)
Sodium: 132 mmol/L — ABNORMAL LOW (ref 135–145)

## 2022-07-15 LAB — CBC
HCT: 35.5 % — ABNORMAL LOW (ref 36.0–46.0)
Hemoglobin: 12 g/dL (ref 12.0–15.0)
MCH: 29.6 pg (ref 26.0–34.0)
MCHC: 33.8 g/dL (ref 30.0–36.0)
MCV: 87.4 fL (ref 80.0–100.0)
Platelets: 355 10*3/uL (ref 150–400)
RBC: 4.06 MIL/uL (ref 3.87–5.11)
RDW: 15.3 % (ref 11.5–15.5)
WBC: 15.1 10*3/uL — ABNORMAL HIGH (ref 4.0–10.5)
nRBC: 0 % (ref 0.0–0.2)

## 2022-07-15 MED ORDER — ALBUTEROL SULFATE (2.5 MG/3ML) 0.083% IN NEBU: 2.5000 mg | INHALATION_SOLUTION | RESPIRATORY_TRACT | Status: DC | PRN

## 2022-07-15 MED ORDER — BUTALBITAL-APAP-CAFFEINE 50-325-40 MG PO TABS
2.0000 | ORAL_TABLET | Freq: Once | ORAL | Status: AC
  Administered 2022-07-15: 2 via ORAL
  Filled 2022-07-15: qty 2

## 2022-07-15 MED ORDER — IPRATROPIUM-ALBUTEROL 0.5-2.5 (3) MG/3ML IN SOLN
3.0000 mL | Freq: Four times a day (QID) | RESPIRATORY_TRACT | Status: DC
  Administered 2022-07-15 – 2022-07-18 (×10): 3 mL via RESPIRATORY_TRACT
  Filled 2022-07-15 (×10): qty 3

## 2022-07-15 NOTE — Progress Notes (Signed)
PROGRESS NOTE     Beverly [name], is a 89 y.o. female, DOB - [birth year], NGE:[id number]  Admit date - 07/14/2022   Admitting Physician Ejiroghene Wendall Stade, MD  Outpatient Primary MD for the patient is Benita Stabile, MD  LOS - 1  Chief Complaint  Patient presents with   Cough    Generalized weakness         Brief Narrative:  89 y.o. female with medical history significant for STEMI, Cardiac arrest, COPD, coronary artery disease, subdural hematoma, hypertension, seizures admitted on 07/14/2022 with acute hypoxic respiratory failure secondary to CAP    -Assessment and Plan: 1)PNA---CAP - COVID-negative.   -Chest x-ray on clinical evaluation consistent with pneumonia -Cough dyspnea and hypoxia persist -WBC 15.1.Marland KitchenMarland Kitchen No fevers -- Continue bronchodilators, mucolytics and IV ceftriaxone and azithromycin  2)Acute metabolic encephalopathy Likely secondary to pneumonia.   -CT head without acute findings patient does have old lacunar infarcts in the bilateral basal ganglia -Mentation appears back to baseline  3)Acute hypoxic respiratory failure (HCC) --Secondary to pneumonia as above #1  ---O2 sats currently 91% on 3L.  4)Hyponatremia Sodium 128 on admission.  Baseline 131-134. -Sodium improving after IV fluids  Coronary artery disease with angina pectoris (HCC) History of ST elevation MI and cardiac arrest 2020, with successful PCI/DES. -Continue aspirin, statins, carvedilol  COPD (chronic obstructive pulmonary disease) (HCC) Stable.   Major depression, recurrent, chronic (HCC) C/n risperidone, Xanax 0.5 as needed at bedtime  HTN (hypertension) Stable. C/n carvedilol 6.25, Norvasc 5  Cardiac murmur Present on exam today.  Daughter unaware of history of cardiac murmur.  Last echo 2020 shows mild aortic stenosis.   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 Barriers: Not Clinically Stable-   Code Status :  -  Code Status: DNR   Family Communication:    (patient is alert, awake and coherent)  -[name] patient's daughter is primary contact [phone]  DVT Prophylaxis  :   - SCDs  enoxaparin (LOVENOX) injection 40 mg Start: 07/14/22 2200   Lab Results  Component Value Date   PLT 355 07/15/2022    Inpatient Medications  Scheduled Meds:  amLODipine  10 mg Oral Daily   aspirin  81 mg Oral Daily   atorvastatin  80 mg Oral q1800   carvedilol  6.25 mg Oral BID WC   enoxaparin (LOVENOX) injection  40 mg Subcutaneous Q24H   levETIRAcetam  500 mg Oral BID   memantine  5 mg Oral BID   nystatin   Topical BID   pantoprazole  40 mg Oral Daily   risperiDONE  0.5 mg Oral BID   Continuous Infusions:  azithromycin 500 mg (07/15/22 1752)   cefTRIAXone (ROCEPHIN)  IV 2 g (07/15/22 1712)   PRN Meds:.acetaminophen **OR** acetaminophen, ALPRAZolam, ipratropium-albuterol, ondansetron **OR** ondansetron (ZOFRAN) IV, mouth rinse, polyethylene glycol   Anti-infectives (From admission, onward)    Start     Dose/Rate Route Frequency Ordered Stop   07/15/22 1700  cefTRIAXone (ROCEPHIN) 2 g in sodium chloride 0.9 %  100 mL IVPB        2 g 200 mL/hr over 30 Minutes Intravenous Every 24 hours 07/14/22 2107 07/20/22 1659   07/15/22 1700  azithromycin (ZITHROMAX) 500 mg in sodium chloride 0.9 % 250 mL IVPB        500 mg 250 mL/hr over 60 Minutes Intravenous Every 24 hours 07/14/22 2107 07/20/22 1659   07/14/22 2130  cefTRIAXone (ROCEPHIN) 1 g in sodium chloride 0.9 % 100 mL IVPB        1 g 200 mL/hr over 30 Minutes Intravenous  Once 07/14/22 2044 07/14/22 2157   07/14/22 1745  cefTRIAXone (ROCEPHIN) 1 g in sodium chloride 0.9 %  100 mL IVPB        1 g 200 mL/hr over 30 Minutes Intravenous  Once 07/14/22 1744 07/14/22 1910   07/14/22 1745  azithromycin (ZITHROMAX) 500 mg in sodium chloride 0.9 % 250 mL IVPB        500 mg 250 mL/hr over 60 Minutes Intravenous  Once 07/14/22 1744 07/14/22 1905         Subjective: Beverly [name] today has no fevers, no emesis,  No chest pain,   - Cough dyspnea and hypoxia persist  Mentation appears back to baseline  -  Objective: Vitals:   07/15/22 0751 07/15/22 0956 07/15/22 1152 07/15/22 1506  BP: (!) 152/77  (!) 150/75   Pulse: (!) 105  96   Resp: 20  20   Temp: 98.5 F (36.9 C)  97.9 F (36.6 C)   TempSrc:      SpO2: (!) 86% 92% 93% 90%  Weight:      Height:        Intake/Output Summary (Last 24 hours) at 07/15/2022 1814 Last data filed at 07/15/2022 1751 Gross per 24 hour  Intake 1914.56 ml  Output 1450 ml  Net 464.56 ml   Filed Weights   07/14/22 1600 07/14/22 2023  Weight: 62.1 kg 65 kg   Physical Exam  Gen:- Awake Alert, in no apparent distress , head neck and head tilted to the right HEENT:- [place].AT, No sclera icterus Nose-[place]  3L/min Neck-Supple Neck,No JVD,.  Lungs-diminished breath sounds, scattered rhonchi  CV- S1, S2 normal, regular , 3/6 SM Abd-  +ve B.Sounds, Abd Soft, No tenderness,    Extremity/Skin:- No  edema, pedal pulses present  Psych-affect is appropriate, oriented x3 Neuro-no new focal deficits, no tremors  Data Reviewed: I have personally reviewed following labs and imaging studies  CBC: Recent Labs  Lab 07/14/22 1702 07/15/22 0518  WBC 14.0* 15.1*  NEUTROABS 10.6*  --   HGB 11.3* 12.0  HCT 34.9* 35.5*  MCV 89.5 87.4  PLT 335 355   Basic Metabolic Panel: Recent Labs  Lab 07/14/22 1702 07/15/22 0518  NA 128* 132*  K 4.5 3.8  CL 91* 94*  CO2 27 27  GLUCOSE 126* 111*  BUN 23 12  CREATININE 0.64 0.51  CALCIUM 9.3 9.3   GFR: Estimated Creatinine Clearance: 41.7 mL/min (by C-G formula based on SCr of 0.51  mg/dL).   Recent Results (from the past 240 hour(s))  SARS Coronavirus 2 by RT PCR (hospital order, performed in [facility] hospital lab) *cepheid single result test* Anterior Nasal Swab     Status: None   Collection Time: 07/14/22  5:28 PM   Specimen: Anterior Nasal Swab  Result Value Ref Range Status   SARS Coronavirus 2 by RT PCR NEGATIVE NEGATIVE Final    Comment: (NOTE) SARS-CoV-2 target nucleic  acids are NOT DETECTED.  The SARS-CoV-2 RNA is generally detectable in upper and lower respiratory specimens during the acute phase of infection. The lowest concentration of SARS-CoV-2 viral copies this assay can detect is 250 copies / mL. A negative result does not preclude SARS-CoV-2 infection and should not be used as the sole basis for treatment or other patient management decisions.  A negative result may occur with improper specimen collection / handling, submission of specimen other than nasopharyngeal swab, presence of viral mutation(s) within the areas targeted by this assay, and inadequate number of viral copies (<250 copies / mL). A negative result must be combined with clinical observations, patient history, and epidemiological information.  Fact Sheet for Patients:   RoadLapTop.co.za  Fact Sheet for Healthcare Providers: http://kim-miller.com/  This test is not yet approved or  cleared by the [place] FDA and has been authorized for detection and/or diagnosis of SARS-CoV-2 by FDA under an Emergency Use Authorization (EUA).  This EUA will remain in effect (meaning this test can be used) for the duration of the COVID-19 declaration under Section 564(b)(1) of the Act, 21 U.S.C. section 360bbb-3(b)(1), unless the authorization is terminated or revoked sooner.  Performed at [facility], [address], [place], [place] [address]   MRSA Next Gen by PCR, Nasal     Status: None   Collection Time: 07/14/22  8:50 PM    Specimen: Nasal Mucosa; Nasal Swab  Result Value Ref Range Status   MRSA by PCR Next Gen NOT DETECTED NOT DETECTED Final    Comment: (NOTE) The GeneXpert MRSA Assay (FDA approved for NASAL specimens only), is one component of a comprehensive MRSA colonization surveillance program. It is not intended to diagnose MRSA infection nor to guide or monitor treatment for MRSA infections. Test performance is not FDA approved in patients less than 17 years old. Performed at [facility], [address] [place], [place] [address]     Radiology Studies: CT HEAD WO CONTRAST ( )  Result Date: 07/15/2022 CLINICAL DATA:  Right-sided head pain EXAM: CT HEAD WITHOUT CONTRAST TECHNIQUE: Contiguous axial images were obtained from the base of the skull through the vertex without intravenous contrast. RADIATION DOSE REDUCTION: This exam was performed according to the departmental dose-optimization program which includes automated exposure control, adjustment of the mA and/or kV according to patient size and/or use of iterative reconstruction technique. COMPARISON:  Head CT 02/23/2020 FINDINGS: Brain: No evidence of acute infarction, hemorrhage, hydrocephalus, extra-axial collection or mass lesion/mass effect. There is mild diffuse atrophy and mild periventricular white matter hypodensity, likely chronic small vessel ischemic change. There are old lacunar infarcts in the bilateral basal ganglia. Vascular: Atherosclerotic calcifications are present within the cavernous internal carotid arteries. Skull: Old left frontal craniotomy present.  No acute fractures. Sinuses/Orbits: No acute finding. Other: None. IMPRESSION: 1. No acute intracranial process. 2. Mild diffuse atrophy and chronic small vessel ischemic changes. 3. Old lacunar infarcts in the bilateral basal ganglia. Electronically Signed   By: Darliss Cheney M.D.   On: 07/15/2022 00:51   DG Chest 2 View  Result Date: 07/14/2022 CLINICAL DATA:  Cough.  Hypoxia.   Generalized weakness. EXAM: CHEST - 2 VIEW COMPARISON:  One view chest x-ray 11/18/2021 FINDINGS: The heart is enlarged. A large hiatal hernia is present. Atherosclerotic calcifications are present at the aortic arch. Superimposed interstitial and airspace opacities are present in the upper lobes bilaterally. Chronic elevation of the right hemidiaphragm is noted. Small effusions are suspected. Degenerative changes are noted in  both shoulders. IMPRESSION: 1. Cardiomegaly without failure. 2. Superimposed interstitial and airspace opacities in the upper lobes bilaterally. This may represent edema or infection. 3. Large hiatal hernia. Electronically Signed   By: Marin Roberts M.D.   On: 07/14/2022 17:08    Scheduled Meds:  amLODipine  10 mg Oral Daily   aspirin  81 mg Oral Daily   atorvastatin  80 mg Oral q1800   carvedilol  6.25 mg Oral BID WC   enoxaparin (LOVENOX) injection  40 mg Subcutaneous Q24H   levETIRAcetam  500 mg Oral BID   memantine  5 mg Oral BID   nystatin   Topical BID   pantoprazole  40 mg Oral Daily   risperiDONE  0.5 mg Oral BID   Continuous Infusions:  azithromycin 500 mg (07/15/22 1752)   cefTRIAXone (ROCEPHIN)  IV 2 g (07/15/22 1712)     LOS: 1 day    Shon Hale M.D on 07/15/2022 at 6:14 PM  Go to [email or web address] - for contact info  [facility] - Office  [phone]  If 7PM-7AM, please contact night-coverage [email or web address] 07/15/2022, 6:14 PM

## 2022-07-15 NOTE — Progress Notes (Signed)
Pt a/o x4, but needs reorienting frequently. Pt was restless over the night and was pulling telemetry monitor off and has removed 2 Ivs, both catheter intact and skin. Pt provided medication for pain/anxiety relief, but still slightly forgetful at times. Educated on purpose of telemetry and IV. Daughter called for an update and informed her of the CT scan being ordered/completed and medication patient received with pt permission. Pt a/o x4 at this time with no complaints of head pain. Fiorcet was a relief.

## 2022-07-15 NOTE — Progress Notes (Signed)
   07/15/22 1456  TOC Brief Assessment  Insurance and Status Reviewed  Patient has primary care physician Yes  Home environment has been reviewed from home with dtr  Prior level of function: daughter assists  Prior/Current Home Services No current home services  Social Determinants of Health Reivew SDOH reviewed no interventions necessary  Readmission risk has been reviewed Yes  Transition of care needs no transition of care needs at this time   Transition of Care Department [facility]) has reviewed patient and no TOC needs have been identified at this time. We will continue to monitor patient advancement through interdisciplinary progression rounds. If new patient transition needs arise, please place a TOC consult.

## 2022-07-15 NOTE — Plan of Care (Signed)
  Problem: Education: Goal: Knowledge of General Education information will improve Description: Including pain rating scale, medication(s)/side effects and non-pharmacologic comfort measures Outcome: Not Progressing   Problem: Health Behavior/Discharge Planning: Goal: Ability to manage health-related needs will improve Outcome: Not Progressing   

## 2022-07-15 NOTE — Care Management Important Message (Signed)
Important Message  Patient Details  Name: ANALICE HOLTZ MRN: [id number] Date of Birth: 1932-06-25   Medicare Important Message Given:  N/A - LOS <3 / Initial given by admissions     Corey Harold 07/15/2022, 4:20 PM

## 2022-07-16 DIAGNOSIS — J189 Pneumonia, unspecified organism: Secondary | ICD-10-CM | POA: Diagnosis not present

## 2022-07-16 DIAGNOSIS — J9601 Acute respiratory failure with hypoxia: Secondary | ICD-10-CM | POA: Diagnosis not present

## 2022-07-16 LAB — BLOOD GAS, ARTERIAL
Acid-Base Excess: 7.5 mmol/L — ABNORMAL HIGH (ref 0.0–2.0)
Bicarbonate: 31.1 mmol/L — ABNORMAL HIGH (ref 20.0–28.0)
FIO2: 32 %
O2 Saturation: 91.1 %
Patient temperature: 37
pCO2 arterial: 39 mmHg (ref 32–48)
pH, Arterial: 7.51 — ABNORMAL HIGH (ref 7.35–7.45)
pO2, Arterial: 57 mmHg — ABNORMAL LOW (ref 83–108)

## 2022-07-16 NOTE — Plan of Care (Signed)
  Problem: Pain Managment: Goal: General experience of comfort will improve Outcome: Progressing   Problem: Safety: Goal: Ability to remain free from injury will improve Outcome: Progressing   

## 2022-07-16 NOTE — Progress Notes (Signed)
PROGRESS NOTE  Beverly [name], is a 89 y.o. female, DOB - 11/26/1932, ZOX:[id number]  Admit date - 07/14/2022   Admitting Physician Ejiroghene Wendall Stade, MD  Outpatient Primary MD for the patient is Benita Stabile, MD  LOS - 2  Chief Complaint  Patient presents with   Cough    Generalized weakness        Brief Narrative:  89 y.o. female with medical history significant for STEMI, Cardiac arrest, COPD, coronary artery disease, subdural hematoma, hypertension, seizures admitted on 07/14/2022 with acute hypoxic respiratory failure secondary to CAP    -Assessment and Plan: 1)PNA---CAP - COVID-negative.   -Chest x-ray on clinical evaluation consistent with pneumonia -Cough dyspnea and hypoxia persist -WBC 15.1.Marland KitchenMarland Kitchen No fevers -- Continue bronchodilators, mucolytics and IV ceftriaxone and azithromycin  2)Acute metabolic encephalopathy Likely secondary to pneumonia.   -CT head without acute findings patient does have old lacunar infarcts in the bilateral basal ganglia -Mentation appears back to baseline  3)Acute hypoxic respiratory failure (HCC) --Secondary to pneumonia as above #1  07/16/22 --- Failed oxygen weaning continue to require up to 2 L of oxygen via nasal cannula at this time to maintain O2 sats above 90%  4)Hyponatremia Sodium 128 on admission.  Baseline 131-134. -Sodium improving after IV fluids  Coronary artery disease with angina pectoris (HCC) History of ST elevation MI and cardiac arrest 2020, with successful PCI/DES. -Continue aspirin, statins, carvedilol  COPD (chronic obstructive pulmonary disease) (HCC) Stable.  Major depression, recurrent, chronic (HCC) C/n risperidone, Xanax 0.5 as needed at bedtime  HTN (hypertension) Stable. 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 Barriers: Not Clinically Stable-   Code Status :  -  Code Status: DNR   Family Communication:    (patient is alert, awake and coherent)  -I called and updated [name] patient's daughter at [phone]  DVT Prophylaxis  :   - SCDs  enoxaparin (LOVENOX) injection 40 mg Start: 07/14/22 2200  Lab Results  Component Value Date   PLT 355 07/15/2022   Inpatient Medications  Scheduled Meds:  amLODipine  10 mg Oral Daily   aspirin  81 mg Oral Daily   atorvastatin  80 mg Oral q1800   carvedilol  6.25 mg Oral BID WC   enoxaparin (LOVENOX) injection  40 mg Subcutaneous Q24H   ipratropium-albuterol  3 mL Nebulization QID   levETIRAcetam  500 mg Oral BID   memantine  5 mg Oral BID   nystatin   Topical BID   pantoprazole  40 mg Oral Daily   risperiDONE  0.5 mg Oral BID   Continuous Infusions:  azithromycin 500 mg (07/15/22 1752)   cefTRIAXone (ROCEPHIN)  IV 2 g (07/15/22 1712)   PRN Meds:.acetaminophen **OR** acetaminophen, albuterol, ALPRAZolam, ondansetron **OR** ondansetron (ZOFRAN) IV, mouth rinse, polyethylene glycol   Anti-infectives (From admission,  onward)    Start     Dose/Rate Route Frequency Ordered Stop   07/15/22 1700  cefTRIAXone (ROCEPHIN) 2 g in sodium chloride 0.9 % 100 mL IVPB        2 g 200 mL/hr over 30 Minutes Intravenous Every 24 hours 07/14/22 2107 07/20/22 1659   07/15/22 1700  azithromycin (ZITHROMAX) 500 mg in sodium chloride 0.9 % 250 mL IVPB        500 mg 250 mL/hr over 60 Minutes Intravenous Every 24 hours 07/14/22 2107 07/20/22 1659   07/14/22 2130  cefTRIAXone (ROCEPHIN) 1 g in sodium  chloride 0.9 % 100 mL IVPB        1 g 200 mL/hr over 30 Minutes Intravenous  Once 07/14/22 2044 07/14/22 2157   07/14/22 1745  cefTRIAXone (ROCEPHIN) 1 g in sodium chloride 0.9 % 100 mL IVPB        1 g 200 mL/hr over 30 Minutes Intravenous  Once 07/14/22 1744 07/14/22 1910   07/14/22 1745  azithromycin (ZITHROMAX) 500 mg in sodium chloride 0.9 % 250 mL IVPB        500 mg 250 mL/hr over 60 Minutes Intravenous  Once 07/14/22 1744 07/14/22 1905       Subjective: Norinne Kutzler today has no fevers, no emesis,  No chest pain,   - Cough dyspnea and hypoxia persist--on 3L/min  Mentation appears back to baseline -Trying to feed herself -I called and updated patient's daughter [name]--questions answered  Objective: Vitals:   07/16/22 0535 07/16/22 0948 07/16/22 1329 07/16/22 1500  BP: (!) 160/87   (!) 152/76  Pulse: (!) 105   (!) 110  Resp: 20   18  Temp: 97.6 F (36.4 C)   (!) 97.4 F (36.3 C)  TempSrc:    Oral  SpO2: 92% (!) 89% 92% 93%  Weight:      Height:        Intake/Output Summary (Last 24 hours) at 07/16/2022 1555 Last data filed at 07/16/2022 1500 Gross per 24 hour  Intake 953.17 ml  Output 1250 ml  Net -296.83 ml   Filed Weights   07/14/22 1600 07/14/22 2023  Weight: 62.1 kg 65 kg   Physical Exam  Gen:- Awake Alert, in no apparent distress , head neck and head tilted to the right HEENT:- [place].AT, No sclera icterus Nose-  3L/min Neck-Supple Neck,No JVD,.  Lungs-diminished breath sounds, scattered rhonchi  CV- S1, S2 normal, regular , 3/6 SM Abd-  +ve B.Sounds, Abd Soft, No tenderness,    Extremity/Skin:- No  edema, pedal pulses present  Psych-affect is appropriate, oriented x3 Neuro-no new focal deficits, no tremors  Data Reviewed: I have personally reviewed following labs and imaging studies  CBC: Recent Labs  Lab 07/14/22 1702 07/15/22 0518  WBC 14.0* 15.1*  NEUTROABS 10.6*  --   HGB 11.3* 12.0  HCT 34.9* 35.5*  MCV 89.5 87.4  PLT 335  355   Basic Metabolic Panel: Recent Labs  Lab 07/14/22 1702 07/15/22 0518  NA 128* 132*  K 4.5 3.8  CL 91* 94*  CO2 27 27  GLUCOSE 126* 111*  BUN 23 12  CREATININE 0.64 0.51  CALCIUM 9.3 9.3   GFR: Estimated Creatinine Clearance: 41.7 mL/min (by C-G formula based on SCr of 0.51 mg/dL).    Recent Results (from the past 240 hour(s))  SARS Coronavirus 2 by RT PCR (hospital order, performed in [facility] hospital lab) *cepheid single result test* Anterior Nasal Swab  Status: None   Collection Time: 07/14/22  5:28 PM   Specimen: Anterior Nasal Swab  Result Value Ref Range Status   SARS Coronavirus 2 by RT PCR NEGATIVE NEGATIVE Final    Comment: (NOTE) SARS-CoV-2 target nucleic acids are NOT DETECTED.  The SARS-CoV-2 RNA is generally detectable in upper and lower respiratory specimens during the acute phase of infection. The lowest concentration of SARS-CoV-2 viral copies this assay can detect is 250 copies / mL. A negative result does not preclude SARS-CoV-2 infection and should not be used as the sole basis for treatment or other patient management decisions.  A negative result may occur with improper specimen collection / handling, submission of specimen other than nasopharyngeal swab, presence of viral mutation(s) within the areas targeted by this assay, and inadequate number of viral copies (<250 copies / mL). A negative result must be combined with clinical observations, patient history, and epidemiological information.  Fact Sheet for Patients:   RoadLapTop.co.za  Fact Sheet for Healthcare Providers: http://kim-miller.com/  This test is not yet approved or  cleared by the [place] FDA and has been authorized for detection and/or diagnosis of SARS-CoV-2 by FDA under an Emergency Use Authorization (EUA).  This EUA will remain in effect (meaning this test can be used) for the duration of the COVID-19 declaration  under Section 564(b)(1) of the Act, 21 U.S.C. section 360bbb-3(b)(1), unless the authorization is terminated or revoked sooner.  Performed at [facility], [address], [place], [place] [address]   MRSA Next Gen by PCR, Nasal     Status: None   Collection Time: 07/14/22  8:50 PM   Specimen: Nasal Mucosa; Nasal Swab  Result Value Ref Range Status   MRSA by PCR Next Gen NOT DETECTED NOT DETECTED Final    Comment: (NOTE) The GeneXpert MRSA Assay (FDA approved for NASAL specimens only), is one component of a comprehensive MRSA colonization surveillance program. It is not intended to diagnose MRSA infection nor to guide or monitor treatment for MRSA infections. Test performance is not FDA approved in patients less than 52 years old. Performed at [facility], [address], [place], [place] [address]     Radiology Studies: CT HEAD WO CONTRAST ( )  Result Date: 07/15/2022 CLINICAL DATA:  Right-sided head pain EXAM: CT HEAD WITHOUT CONTRAST TECHNIQUE: Contiguous axial images were obtained from the base of the skull through the vertex without intravenous contrast. RADIATION DOSE REDUCTION: This exam was performed according to the departmental dose-optimization program which includes automated exposure control, adjustment of the mA and/or kV according to patient size and/or use of iterative reconstruction technique. COMPARISON:  Head CT 02/23/2020 FINDINGS: Brain: No evidence of acute infarction, hemorrhage, hydrocephalus, extra-axial collection or mass lesion/mass effect. There is mild diffuse atrophy and mild periventricular white matter hypodensity, likely chronic small vessel ischemic change. There are old lacunar infarcts in the bilateral basal ganglia. Vascular: Atherosclerotic calcifications are present within the cavernous internal carotid arteries. Skull: Old left frontal craniotomy present.  No acute fractures. Sinuses/Orbits: No acute finding. Other: None. IMPRESSION: 1. No acute  intracranial process. 2. Mild diffuse atrophy and chronic small vessel ischemic changes. 3. Old lacunar infarcts in the bilateral basal ganglia. Electronically Signed   By: Darliss Cheney M.D.   On: 07/15/2022 00:51   DG Chest 2 View  Result Date: 07/14/2022 CLINICAL DATA:  Cough.  Hypoxia.  Generalized weakness. EXAM: CHEST - 2 VIEW COMPARISON:  One view chest x-ray 11/18/2021 FINDINGS: The heart is enlarged. A large  hiatal hernia is present. Atherosclerotic calcifications are present at the aortic arch. Superimposed interstitial and airspace opacities are present in the upper lobes bilaterally. Chronic elevation of the right hemidiaphragm is noted. Small effusions are suspected. Degenerative changes are noted in both shoulders. IMPRESSION: 1. Cardiomegaly without failure. 2. Superimposed interstitial and airspace opacities in the upper lobes bilaterally. This may represent edema or infection. 3. Large hiatal hernia. Electronically Signed   By: Marin Roberts M.D.   On: 07/14/2022 17:08    Scheduled Meds:  amLODipine  10 mg Oral Daily   aspirin  81 mg Oral Daily   atorvastatin  80 mg Oral q1800   carvedilol  6.25 mg Oral BID WC   enoxaparin (LOVENOX) injection  40 mg Subcutaneous Q24H   ipratropium-albuterol  3 mL Nebulization QID   levETIRAcetam  500 mg Oral BID   memantine  5 mg Oral BID   nystatin   Topical BID   pantoprazole  40 mg Oral Daily   risperiDONE  0.5 mg Oral BID   Continuous Infusions:  azithromycin 500 mg (07/15/22 1752)   cefTRIAXone (ROCEPHIN)  IV 2 g (07/15/22 1712)    LOS: 2 days   Shon Hale M.D on 07/16/2022 at 3:55 PM  Go to [email or web address] - for contact info  [facility] - Office  [phone]  If 7PM-7AM, please contact night-coverage [email or web address] 07/16/2022, 3:55 PM

## 2022-07-17 DIAGNOSIS — I10 Essential (primary) hypertension: Secondary | ICD-10-CM | POA: Diagnosis not present

## 2022-07-17 DIAGNOSIS — J189 Pneumonia, unspecified organism: Secondary | ICD-10-CM | POA: Diagnosis not present

## 2022-07-17 DIAGNOSIS — E871 Hypo-osmolality and hyponatremia: Secondary | ICD-10-CM | POA: Diagnosis not present

## 2022-07-17 LAB — BASIC METABOLIC PANEL
Anion gap: 11 (ref 5–15)
BUN: 13 mg/dL (ref 8–23)
CO2: 26 mmol/L (ref 22–32)
Calcium: 8.8 mg/dL — ABNORMAL LOW (ref 8.9–10.3)
Chloride: 96 mmol/L — ABNORMAL LOW (ref 98–111)
Creatinine, Ser: 0.53 mg/dL (ref 0.44–1.00)
GFR, Estimated: >60 mL/min (ref >60)
Glucose, Bld: 106 mg/dL — ABNORMAL HIGH (ref 70–99)
Potassium: 3 mmol/L — ABNORMAL LOW (ref 3.5–5.1)
Sodium: 133 mmol/L — ABNORMAL LOW (ref 135–145)

## 2022-07-17 LAB — CBC
HCT: 36.9 % (ref 36.0–46.0)
Hemoglobin: 12.1 g/dL (ref 12.0–15.0)
MCH: 29.4 pg (ref 26.0–34.0)
MCHC: 32.8 g/dL (ref 30.0–36.0)
MCV: 89.6 fL (ref 80.0–100.0)
Platelets: 303 10*3/uL (ref 150–400)
RBC: 4.12 MIL/uL (ref 3.87–5.11)
RDW: 15.5 % (ref 11.5–15.5)
WBC: 13.8 10*3/uL — ABNORMAL HIGH (ref 4.0–10.5)
nRBC: 0 % (ref 0.0–0.2)

## 2022-07-17 MED ORDER — POTASSIUM CHLORIDE CRYS ER 20 MEQ PO TBCR
40.0000 meq | EXTENDED_RELEASE_TABLET | ORAL | Status: AC
  Administered 2022-07-17 (×2): 40 meq via ORAL
  Filled 2022-07-17 (×2): qty 2

## 2022-07-17 MED ORDER — GUAIFENESIN 100 MG/5ML PO LIQD
5.0000 mL | ORAL | Status: DC | PRN
  Administered 2022-07-17 – 2022-07-18 (×2): 5 mL via ORAL
  Filled 2022-07-17 (×2): qty 5

## 2022-07-17 NOTE — Plan of Care (Signed)
  Problem: Acute Rehab PT Goals(only PT should resolve) Goal: Patient Will Transfer Sit To/From Stand Outcome: Progressing Flowsheets (Taken 07/17/2022 1148) Patient will transfer sit to/from stand:  with min guard assist  with minimal assist Goal: Pt Will Transfer Bed To Chair/Chair To Bed Outcome: Progressing Flowsheets (Taken 07/17/2022 1148) Pt will Transfer Bed to Chair/Chair to Bed:  min guard assist  with min assist Goal: Pt Will Ambulate Outcome: Progressing Flowsheets (Taken 07/17/2022 1148) Pt will Ambulate:  10 feet  with minimal assist  with least restrictive assistive device Goal: Pt/caregiver will Perform Home Exercise Program Outcome: Progressing Flowsheets (Taken 07/17/2022 1148) Pt/caregiver will Perform Home Exercise Program:  For increased strengthening  For improved balance  With Supervision, verbal cues required/provided  11:49 AM, 07/17/22 Wyman Songster PT, DPT Physical Therapist at [facility]

## 2022-07-17 NOTE — Progress Notes (Signed)
PROGRESS NOTE  Beverly [name], is a 89 y.o. female, DOB - [birth year], WUJ:[id number]  Admit date - 07/14/2022   Admitting Physician Onnie Boer, MD  Outpatient Primary MD for the patient is Benita Stabile, MD  LOS - 3  Chief Complaint  Patient presents with   Cough    Generalized weakness        Brief Narrative:  89 y.o. female with medical history significant for STEMI, Cardiac arrest, COPD, coronary artery disease, subdural hematoma, hypertension, seizures admitted on 07/14/2022 with acute hypoxic respiratory failure secondary to CAP    -Assessment and Plan: 1)PNA---CAP - COVID-negative.   -Chest x-ray and  clinical evaluation consistent with pneumonia 07/17/22 -WBC 15.1>>13.8 --- No fevers -Hypoxia improving but not quite resolved yet -Cough and dyspnea improving -- Continue bronchodilators, mucolytics and IV ceftriaxone and azithromycin  2)Acute metabolic encephalopathy Likely secondary to pneumonia.   -CT head without acute findings patient does have old lacunar infarcts in the bilateral basal ganglia -Mentation appears back to baseline according to caregiver Beverly [name] who is at bedside and patient's daughter Beverly [name]  3)Acute hypoxic respiratory failure (HCC) --Secondary to pneumonia as above #1  07/17/22 -Did better with attempt to wean off oxygen today  currently requiring 1 to 2 L per nasal cannula----hopefully she can be weaned off over the next 24 hours or so  4)Hyponatremia Sodium 128 on admission.  Baseline 131-134. -Sodium improved to 133 after IV fluids  Coronary artery disease with angina pectoris (HCC) History of ST elevation MI and cardiac arrest 2020, with successful PCI/DES. -Continue aspirin, statins, carvedilol  COPD (chronic obstructive pulmonary disease) (HCC) Stable.  Major depression, recurrent, chronic (HCC) C/n risperidone, Xanax 0.5 as needed at bedtime  HTN (hypertension) Stable. 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 Barriers: Not Clinically Stable-   Code Status :  -  Code Status: DNR   Family Communication:    (patient is alert, awake and coherent)  updated Beverly [name] patient's daughter at [phone]  DVT Prophylaxis  :   - SCDs  enoxaparin (LOVENOX) injection 40 mg Start: 07/14/22 2200  Lab Results  Component Value Date   PLT 303 07/17/2022   Inpatient Medications  Scheduled Meds:  amLODipine  10 mg Oral Daily   aspirin  81 mg Oral Daily   atorvastatin  80 mg Oral q1800   carvedilol  6.25 mg Oral BID WC   enoxaparin (LOVENOX) injection  40 mg Subcutaneous Q24H   ipratropium-albuterol  3 mL Nebulization QID   levETIRAcetam  500 mg Oral BID   memantine  5 mg Oral BID   nystatin   Topical BID   pantoprazole  40 mg Oral Daily   risperiDONE  0.5 mg Oral BID   Continuous Infusions:  azithromycin 500 mg (07/16/22  1709)   cefTRIAXone (ROCEPHIN)  IV 2 g (07/16/22 1822)   PRN Meds:.acetaminophen **OR** acetaminophen, albuterol, ALPRAZolam, ondansetron **OR** ondansetron (ZOFRAN) IV, mouth rinse, polyethylene glycol   Anti-infectives (From admission, onward)    Start     Dose/Rate Route Frequency Ordered Stop   07/15/22 1700  cefTRIAXone (ROCEPHIN) 2 g in sodium chloride 0.9 % 100 mL IVPB        2 g 200 mL/hr over 30 Minutes Intravenous Every 24 hours 07/14/22  2107 07/20/22 1659   07/15/22 1700  azithromycin (ZITHROMAX) 500 mg in sodium chloride 0.9 % 250 mL IVPB        500 mg 250 mL/hr over 60 Minutes Intravenous Every 24 hours 07/14/22 2107 07/20/22 1659   07/14/22 2130  cefTRIAXone (ROCEPHIN) 1 g in sodium chloride 0.9 % 100 mL IVPB        1 g 200 mL/hr over 30 Minutes Intravenous  Once 07/14/22 2044 07/14/22 2157   07/14/22 1745  cefTRIAXone (ROCEPHIN) 1 g in sodium chloride 0.9 % 100 mL IVPB        1 g 200 mL/hr over 30 Minutes Intravenous  Once 07/14/22 1744 07/14/22 1910   07/14/22 1745  azithromycin (ZITHROMAX) 500 mg in sodium chloride 0.9 % 250 mL IVPB        500 mg 250 mL/hr over 60 Minutes Intravenous  Once 07/14/22 1744 07/14/22 1905       Subjective: Beverly [name] today has no fevers, no emesis,  No chest pain,   - -Did well with physical therapist Caregiver Beverly [name] is at bedside, questions answered -Cough and hypoxia and dyspnea improving  Objective: Vitals:   07/17/22 0924 07/17/22 1039 07/17/22 1411 07/17/22 1508  BP:  (!) 151/96 118/64   Pulse:   93   Resp:   16   Temp:   (!) 97.5 F (36.4 C)   TempSrc:      SpO2: 90%  94% 91%  Weight:      Height:        Intake/Output Summary (Last 24 hours) at 07/17/2022 1602 Last data filed at 07/17/2022 1300 Gross per 24 hour  Intake 720 ml  Output 300 ml  Net 420 ml   Filed Weights   07/14/22 1600 07/14/22 2023  Weight: 62.1 kg 65 kg   Physical Exam  Gen:- Awake Alert, in no apparent distress , head neck and head tilted to the right (baseline--Not new) HEENT:- [place].AT, No sclera icterus Nose-[place]  1 to 2L/min Neck-Supple Neck,No JVD,.  Lungs-improving air movement , no wheezing CV- S1, S2 normal, regular , 3/6 SM Abd-  +ve B.Sounds, Abd Soft, No tenderness,    Extremity/Skin:- No  edema, pedal pulses present  Psych-affect is appropriate, oriented x3 Neuro-generalized weakness, no new focal deficits, no tremors  Data Reviewed: I have personally reviewed following  labs and imaging studies  CBC: Recent Labs  Lab 07/14/22 1702 07/15/22 0518 07/17/22 0443  WBC 14.0* 15.1* 13.8*  NEUTROABS 10.6*  --   --   HGB 11.3* 12.0 12.1  HCT 34.9* 35.5* 36.9  MCV 89.5 87.4 89.6  PLT 335 355 303   Basic Metabolic Panel: Recent Labs  Lab 07/14/22 1702 07/15/22 0518 07/17/22 0443  NA 128* 132* 133*  K 4.5 3.8 3.0*  CL 91* 94* 96*  CO2 27 27 26   GLUCOSE 126* 111* 106*  BUN 23 12 13   CREATININE 0.64 0.51 0.53  CALCIUM 9.3 9.3 8.8*   GFR: Estimated Creatinine Clearance: 41.7 mL/min (  by C-G formula based on SCr of 0.53 mg/dL).    Recent Results (from the past 240 hour(s))  SARS Coronavirus 2 by RT PCR (hospital order, performed in [facility] hospital lab) *cepheid single result test* Anterior Nasal Swab     Status: None   Collection Time: 07/14/22  5:28 PM   Specimen: Anterior Nasal Swab  Result Value Ref Range Status   SARS Coronavirus 2 by RT PCR NEGATIVE NEGATIVE Final    Comment: (NOTE) SARS-CoV-2 target nucleic acids are NOT DETECTED.  The SARS-CoV-2 RNA is generally detectable in upper and lower respiratory specimens during the acute phase of infection. The lowest concentration of SARS-CoV-2 viral copies this assay can detect is 250 copies / mL. A negative result does not preclude SARS-CoV-2 infection and should not be used as the sole basis for treatment or other patient management decisions.  A negative result may occur with improper specimen collection / handling, submission of specimen other than nasopharyngeal swab, presence of viral mutation(s) within the areas targeted by this assay, and inadequate number of viral copies (<250 copies / mL). A negative result must be combined with clinical observations, patient history, and epidemiological information.  Fact Sheet for Patients:   RoadLapTop.co.za  Fact Sheet for Healthcare Providers: http://kim-miller.com/  This test is not yet  approved or  cleared by the [place] FDA and has been authorized for detection and/or diagnosis of SARS-CoV-2 by FDA under an Emergency Use Authorization (EUA).  This EUA will remain in effect (meaning this test can be used) for the duration of the COVID-19 declaration under Section 564(b)(1) of the Act, 21 U.S.C. section 360bbb-3(b)(1), unless the authorization is terminated or revoked sooner.  Performed at [facility], [address], [place], [place] [address]   MRSA Next Gen by PCR, Nasal     Status: None   Collection Time: 07/14/22  8:50 PM   Specimen: Nasal Mucosa; Nasal Swab  Result Value Ref Range Status   MRSA by PCR Next Gen NOT DETECTED NOT DETECTED Final    Comment: (NOTE) The GeneXpert MRSA Assay (FDA approved for NASAL specimens only), is one component of a comprehensive MRSA colonization surveillance program. It is not intended to diagnose MRSA infection nor to guide or monitor treatment for MRSA infections. Test performance is not FDA approved in patients less than 79 years old. Performed at [facility], [address] [place], [place] [address]     Radiology Studies: No results found.  Scheduled Meds:  amLODipine  10 mg Oral Daily   aspirin  81 mg Oral Daily   atorvastatin  80 mg Oral q1800   carvedilol  6.25 mg Oral BID WC   enoxaparin (LOVENOX) injection  40 mg Subcutaneous Q24H   ipratropium-albuterol  3 mL Nebulization QID   levETIRAcetam  500 mg Oral BID   memantine  5 mg Oral BID   nystatin   Topical BID   pantoprazole  40 mg Oral Daily   risperiDONE  0.5 mg Oral BID   Continuous Infusions:  azithromycin 500 mg (07/16/22 1709)   cefTRIAXone (ROCEPHIN)  IV 2 g (07/16/22 1822)    LOS: 3 days   Shon Hale M.D on 07/17/2022 at 4:02 PM  Go to [email or web address] - for contact info  [facility] - Office  [phone]  If 7PM-7AM, please contact night-coverage [email or web address] 07/17/2022, 4:02 PM

## 2022-07-17 NOTE — Progress Notes (Signed)
Patient weaned to room air at 1518. O2 sat at 1605 is 91-92% on room air. Spoke with Dr. Shon Hale. He said to place patient on 1L [place]. This RN placed patient on 1L [place].

## 2022-07-17 NOTE — [place] FL2 (Signed)
[place] MEDICAID FL2 LEVEL OF CARE FORM     IDENTIFICATION  Patient Name: Beverly [name] Birthdate: 1932-08-07 Sex: female Admission Date (Current Location): 07/14/2022  [facility] and [place] Number:  [employer] and Address:  [facility],  [address], [place] [address]      Provider Number: [id number]  Attending Physician Name and Address:  Shon Hale, MD  Relative Name and Phone Number:  [name] (Daughter)  [phone] (Home Phone)    Current Level of Care: Hospital Recommended Level of Care: Skilled Nursing Facility Prior Approval Number:    Date Approved/Denied:   PASRR Number:    Discharge Plan: SNF    Current Diagnoses: Patient Active Problem List   Diagnosis Date Noted   PNA (pneumonia) 07/14/2022   Acute hypoxic respiratory failure (HCC) 07/14/2022   Acute metabolic encephalopathy 07/14/2022   Cardiac murmur 07/14/2022   AKI (acute kidney injury) (HCC) 11/26/2021   Arthritis 11/24/2021   Increased intraocular pressure, bilateral 11/24/2021   GERD without esophagitis 11/24/2021   MCI (mild cognitive impairment) 11/24/2021   Major depression, recurrent, chronic (HCC) 11/24/2021   Aortic atherosclerosis (HCC) 11/24/2021   Coronary artery disease with angina pectoris (HCC) 11/24/2021   Septic shock (HCC) 11/18/2021   Right epiretinal membrane 04/13/2021   Lobar pneumonia (HCC) 05/09/2020   Goals of care, counseling/discussion 05/09/2020   Lower urinary tract infectious disease    Sepsis due to undetermined organism (HCC) 05/07/2020   Pleural effusion, right    SIRS (systemic inflammatory response syndrome) (HCC)    Pneumonia 05/02/2020   CAP (community acquired pneumonia) 05/01/2020   Anemia 05/01/2020   Heme positive stool 05/01/2020   Advanced nonexudative age-related macular degeneration of right eye with subfoveal involvement 11/11/2019   Exudative age-related macular degeneration of left eye with active choroidal  neovascularization (HCC) 05/13/2019   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 neovascularization (HCC) 05/13/2019   Primary open angle glaucoma of both eyes, mild stage 05/13/2019   Age-related nuclear cataract of left eye 05/13/2019   Hypokalemia 01/12/2018   Hypomagnesemia 01/12/2018   COPD (chronic obstructive pulmonary disease) (HCC) 01/12/2018   Acute ST elevation myocardial infarction (STEMI) of inferior wall (HCC) 01/10/2018   Cardiac arrest (HCC)    Dyslipidemia (high LDL; low HDL)    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, not intractable, without status epilepticus (HCC) 06/23/2014   History of traumatic subdural hematoma 06/23/2014   Subdural hematoma (HCC) 10/18/2012   TIA (transient ischemic attack) 10/18/2012   HTN (hypertension) 10/18/2012   Hyponatremia 10/18/2012    Orientation RESPIRATION BLADDER Height & Weight     Self, Time, Situation, Place  Normal External catheter Weight: 143 lb 4.8 oz (65 kg) Height:  5' (152.4 cm)  BEHAVIORAL SYMPTOMS/MOOD NEUROLOGICAL BOWEL NUTRITION STATUS      Continent Diet  AMBULATORY STATUS COMMUNICATION OF NEEDS Skin   Limited Assist Verbally Other (Comment) (DRY)                       Personal Care Assistance Level of Assistance  Dressing, Feeding, Bathing Bathing Assistance: Limited assistance Feeding assistance: Limited assistance Dressing Assistance: Limited assistance     Functional Limitations Info  Sight, Hearing Sight Info: Impaired (Glasses) Hearing Info: Impaired (Hearing Aid)      SPECIAL CARE FACTORS FREQUENCY  PT (By licensed PT), OT (By licensed OT)     PT Frequency: 5 X week OT Frequency:  5 X week            Contractures Contractures Info: Not present    Additional Factors Info  Code Status Code Status Info: DNR             Current Medications (07/17/2022):  This is the current hospital  active medication list Current Facility-Administered Medications  Medication Dose Route Frequency Provider Last Rate Last Admin   acetaminophen (TYLENOL) tablet 650 mg  650 mg Oral Q6H PRN Emokpae, Ejiroghene E, MD   650 mg at 07/17/22 1048   Or   acetaminophen (TYLENOL) suppository 650 mg  650 mg Rectal Q6H PRN Emokpae, Ejiroghene E, MD       albuterol (PROVENTIL) (2.5 MG/3ML) 0.083% nebulizer solution 2.5 mg  2.5 mg Nebulization Q2H PRN Emokpae, Courage, MD       ALPRAZolam Prudy Feeler) tablet 0.5 mg  0.5 mg Oral QHS PRN Emokpae, Ejiroghene E, MD   0.5 mg at 07/16/22 1547   amLODipine (NORVASC) tablet 10 mg  10 mg Oral Daily Emokpae, Ejiroghene E, MD   10 mg at 07/17/22 1040   aspirin chewable tablet 81 mg  81 mg Oral Daily Emokpae, Ejiroghene E, MD   81 mg at 07/17/22 1045   atorvastatin (LIPITOR) tablet 80 mg  80 mg Oral q1800 Emokpae, Ejiroghene E, MD   80 mg at 07/16/22 1710   azithromycin (ZITHROMAX) 500 mg in sodium chloride 0.9 % 250 mL IVPB  500 mg Intravenous Q24H Emokpae, Ejiroghene E, MD 250 mL/hr at 07/16/22 1709 500 mg at 07/16/22 1709   carvedilol (COREG) tablet 6.25 mg  6.25 mg Oral BID WC Emokpae, Ejiroghene E, MD   6.25 mg at 07/17/22 1040   cefTRIAXone (ROCEPHIN) 2 g in sodium chloride 0.9 % 100 mL IVPB  2 g Intravenous Q24H Emokpae, Ejiroghene E, MD 200 mL/hr at 07/16/22 1822 2 g at 07/16/22 1822   enoxaparin (LOVENOX) injection 40 mg  40 mg Subcutaneous Q24H Emokpae, Ejiroghene E, MD   40 mg at 07/16/22 2138   ipratropium-albuterol (DUONEB) 0.5-2.5 (3) MG/3ML nebulizer solution 3 mL  3 mL Nebulization QID Mariea Clonts, Courage, MD   3 mL at 07/17/22 0924   levETIRAcetam (KEPPRA) tablet 500 mg  500 mg Oral BID Emokpae, Ejiroghene E, MD   500 mg at 07/17/22 1037   memantine (NAMENDA) tablet 5 mg  5 mg Oral BID Emokpae, Ejiroghene E, MD   5 mg at 07/17/22 1037   nystatin (MYCOSTATIN/NYSTOP) topical powder   Topical BID Adefeso, Oladapo, DO   Given at 07/17/22 1045   ondansetron (ZOFRAN)  tablet 4 mg  4 mg Oral Q6H PRN Emokpae, Ejiroghene E, MD       Or   ondansetron (ZOFRAN) injection 4 mg  4 mg Intravenous Q6H PRN Emokpae, Ejiroghene E, MD       Oral care mouth rinse  15 mL Mouth Rinse PRN Emokpae, Ejiroghene E, MD       pantoprazole (PROTONIX) EC tablet 40 mg  40 mg Oral Daily Emokpae, Ejiroghene E, MD   40 mg at 07/17/22 1037   polyethylene glycol (MIRALAX / GLYCOLAX) packet 17 g  17 g Oral Daily PRN Emokpae, Ejiroghene E, MD       risperiDONE (RISPERDAL) tablet 0.5 mg  0.5 mg Oral BID Emokpae, Ejiroghene E, MD   0.5 mg at 07/17/22 1038     Discharge Medications: Please see discharge summary for a list of discharge medications.  Relevant Imaging Results:  Relevant Lab Results:  Additional Information [phone]  Dominie Benedick I Miko Markwood, LCSW

## 2022-07-17 NOTE — Evaluation (Signed)
Physical Therapy Evaluation Patient Details Name: Beverly [name] MRN: [id number] DOB: 10/07/1932 Today's Date: 07/17/2022  History of Present Illness  Beverly [name] is a 89 y.o. female with medical history significant for STEMI, Cardiac arrest, COPD, coronary artery disease, subdural hematoma, hypertension, seizures.  Patient was brought to the ED via EMS with reports of weakness, cough, headache, over the past several days. She went to see her primary care provider last week, and she was told to come to the ED today, and that her O2 sats had been low.   Reports pain to the right upper side of her head, and headaches, she has not had any falls.  There is no lesion or bumps to her head.  On my evaluation patient is awake alert and able to answer questions.  She denies chest pain, no abdominal pain, no urinary symptoms.  Reports some confused speech over the past week.  She has had poor oral intake.  No lower extremity swelling.   Clinical Impression  Patient limited for functional mobility as stated below secondary to BLE weakness, fatigue and poor standing balance. Patient seated in chair at beginning of session. She requires min assist to transfer to standing 2x with use of RW. Patient able to complete standing marches with bilateral UE support but fatigues quickly. Patient ends session seated in chair. Patient will benefit from continued physical therapy in hospital and recommended venue below to increase strength, balance, endurance for safe ADLs and gait.         Assistance Recommended at Discharge Frequent or constant Supervision/Assistance  If plan is discharge home, recommend the following:  Can travel by private vehicle  A little help with walking and/or transfers;A little help with bathing/dressing/bathroom        Equipment Recommendations None recommended by PT  Recommendations for Other Services       Functional Status Assessment Patient has had a recent decline in their  functional status and demonstrates the ability to make significant improvements in function in a reasonable and predictable amount of time.     Precautions / Restrictions Precautions Precautions: Fall Restrictions Weight Bearing Restrictions: No      Mobility  Bed Mobility               General bed mobility comments: seated in chair at beginning of session    Transfers Overall transfer level: Needs assistance Equipment used: Rolling walker (2 wheels) Transfers: Sit to/from Stand Sit to Stand: Min assist           General transfer comment: assist to transfer to standing due to weakness    Ambulation/Gait                  Stairs            Wheelchair Mobility     Tilt Bed    Modified Rankin (Stroke Patients Only)       Balance Overall balance assessment: Needs assistance Sitting-balance support: Feet supported, No upper extremity supported Sitting balance-Leahy Scale: Good     Standing balance support: Reliant on assistive device for balance, Bilateral upper extremity supported Standing balance-Leahy Scale: Fair Standing balance comment: with RW                             Pertinent Vitals/Pain Pain Assessment Pain Assessment: No/denies pain    Home Living Family/patient expects to be discharged to:: Private residence Living Arrangements: Children Available Help  at Discharge: Family;Available 24 hours/day;Personal care attendant Type of Home: House         Home Layout: One level Home Equipment: Agricultural consultant (2 wheels);Wheelchair - manual;Grab bars - tub/shower;BSC/3in1      Prior Function Prior Level of Function : Needs assist             Mobility Comments: household ambulator using RW ADLs Comments: assisted by family     Hand Dominance        Extremity/Trunk Assessment   Upper Extremity Assessment Upper Extremity Assessment: Generalized weakness    Lower Extremity Assessment Lower Extremity  Assessment: Generalized weakness    Cervical / Trunk Assessment Cervical / Trunk Assessment: Kyphotic  Communication   Communication: No difficulties  Cognition Arousal/Alertness: Awake/alert Behavior During Therapy: WFL for tasks assessed/performed Overall Cognitive Status: Within Functional Limits for tasks assessed                                          General Comments      Exercises General Exercises - Lower Extremity Hip Flexion/Marching: AROM, Both, 10 reps, Standing   Assessment/Plan    PT Assessment Patient needs continued PT services  PT Problem List Decreased strength;Decreased activity tolerance;Decreased balance;Decreased mobility       PT Treatment Interventions DME instruction;Balance training;Gait training;Neuromuscular re-education;Stair training;Functional mobility training;Patient/family education;Therapeutic activities;Therapeutic exercise;Manual techniques    PT Goals (Current goals can be found in the Care Plan section)  Acute Rehab PT Goals Patient Stated Goal: return home PT Goal Formulation: With patient Time For Goal Achievement: 07/24/22 Potential to Achieve Goals: Good    Frequency Min 3X/week     Co-evaluation               AM-PAC PT "6 Clicks" Mobility  Outcome Measure Help needed turning from your back to your side while in a flat bed without using bedrails?: A Little Help needed moving from lying on your back to sitting on the side of a flat bed without using bedrails?: A Little Help needed moving to and from a bed to a chair (including a wheelchair)?: A Little Help needed standing up from a chair using your arms (e.g., wheelchair or bedside chair)?: A Little Help needed to walk in hospital room?: A Lot Help needed climbing 3-5 steps with a railing? : A Lot 6 Click Score: 16    End of Session Equipment Utilized During Treatment: Gait belt;Oxygen Activity Tolerance: Patient tolerated treatment well;Patient  limited by fatigue Patient left: in chair;with call bell/phone within reach;with family/visitor present;with chair alarm set Nurse Communication: Mobility status PT Visit Diagnosis: Unsteadiness on feet (R26.81);Other abnormalities of gait and mobility (R26.89);Muscle weakness (generalized) (M62.81)    Time: 1610-9604 PT Time Calculation (min) (ACUTE ONLY): 18 min   Charges:   PT Evaluation $PT Eval Low Complexity: 1 Low   PT General Charges $$ ACUTE PT VISIT: 1 Visit         11:47 AM, 07/17/22 Wyman Songster PT, DPT Physical Therapist at [facility]

## 2022-07-17 NOTE — TOC Progression Note (Addendum)
Transition of Care [facility]) - Progression Note    Patient Details  Name: Beverly [name] MRN: [id number] Date of Birth: 10-Jan-1932  Transition of [facility] CM/SW Contact  Catalina Gravel, [place] Phone Number: 07/17/2022, 2:02 PM  Clinical Narrative:    Pt is high risk for readmission. CSW completed assessment with pts daughter who states she is the POA.   Pt moves about the house in a transport chair- can make transfers. Prior to hospitalization, there are sitters who assist in the home from 11am to 8 pm.  Recently daughter out from work with a hand injury so she would assists pt if DC home so she is concerned  and supports SNF for short term rehab and discussed this with the Dr.  PT recommended HHPT- daughter states pt is active with [name] and she is concerned this is not enough.  Pt has a 2 wheel walker, shower chair, lift chair and sleeps on the reclining chair. Family assists with transportation to appointments.  TOC to follow.   CSW contacted pt  to discuss DC- pt was not able to hear and bedside visit to follow.  PASSR [id number], FL2 signed Auth pending. CSW visited pt in room.  She feels she is still thinking about it, but we can send requests for placement.    Expected Discharge Plan: Skilled Nursing Facility Barriers to Discharge: Continued Medical Work up  Expected Discharge Plan and Services In-house Referral: Clinical Social Work     Living arrangements for the past 2 months: Single Family Home                                       Social Determinants of Health (SDOH) Interventions SDOH Screenings   Food Insecurity: No Food Insecurity (07/14/2022)  Housing: Low Risk  (07/14/2022)  Transportation Needs: Patient Declined (07/14/2022)  Utilities: Patient Declined (07/14/2022)  Tobacco Use: Low Risk  (07/14/2022)    Readmission Risk Interventions    07/17/2022    1:51 PM 05/04/2020   11:46 AM  Readmission Risk Prevention Plan  Transportation Screening Complete  Complete  PCP or Specialist Appt within 5-7 Days Complete   Home Care Screening Complete Complete  Medication Review (RN CM) Complete Complete

## 2022-07-18 DIAGNOSIS — D638 Anemia in other chronic diseases classified elsewhere: Secondary | ICD-10-CM | POA: Diagnosis not present

## 2022-07-18 DIAGNOSIS — J449 Chronic obstructive pulmonary disease, unspecified: Secondary | ICD-10-CM | POA: Diagnosis not present

## 2022-07-18 DIAGNOSIS — R498 Other voice and resonance disorders: Secondary | ICD-10-CM | POA: Diagnosis not present

## 2022-07-18 DIAGNOSIS — R262 Difficulty in walking, not elsewhere classified: Secondary | ICD-10-CM | POA: Diagnosis not present

## 2022-07-18 DIAGNOSIS — G9341 Metabolic encephalopathy: Secondary | ICD-10-CM | POA: Diagnosis not present

## 2022-07-18 DIAGNOSIS — I10 Essential (primary) hypertension: Secondary | ICD-10-CM | POA: Diagnosis not present

## 2022-07-18 DIAGNOSIS — H353114 Nonexudative age-related macular degeneration, right eye, advanced atrophic with subfoveal involvement: Secondary | ICD-10-CM | POA: Diagnosis not present

## 2022-07-18 DIAGNOSIS — E785 Hyperlipidemia, unspecified: Secondary | ICD-10-CM | POA: Diagnosis not present

## 2022-07-18 DIAGNOSIS — I25119 Atherosclerotic heart disease of native coronary artery with unspecified angina pectoris: Secondary | ICD-10-CM | POA: Diagnosis not present

## 2022-07-18 DIAGNOSIS — I700 Atherosclerosis of aorta: Secondary | ICD-10-CM | POA: Diagnosis not present

## 2022-07-18 DIAGNOSIS — G3184 Mild cognitive impairment, so stated: Secondary | ICD-10-CM | POA: Diagnosis not present

## 2022-07-18 DIAGNOSIS — M159 Polyosteoarthritis, unspecified: Secondary | ICD-10-CM | POA: Diagnosis not present

## 2022-07-18 DIAGNOSIS — D649 Anemia, unspecified: Secondary | ICD-10-CM | POA: Diagnosis not present

## 2022-07-18 DIAGNOSIS — H353222 Exudative age-related macular degeneration, left eye, with inactive choroidal neovascularization: Secondary | ICD-10-CM | POA: Diagnosis not present

## 2022-07-18 DIAGNOSIS — R2681 Unsteadiness on feet: Secondary | ICD-10-CM | POA: Diagnosis not present

## 2022-07-18 DIAGNOSIS — E876 Hypokalemia: Secondary | ICD-10-CM | POA: Diagnosis not present

## 2022-07-18 DIAGNOSIS — J189 Pneumonia, unspecified organism: Secondary | ICD-10-CM | POA: Diagnosis not present

## 2022-07-18 DIAGNOSIS — J42 Unspecified chronic bronchitis: Secondary | ICD-10-CM | POA: Diagnosis not present

## 2022-07-18 DIAGNOSIS — H40053 Ocular hypertension, bilateral: Secondary | ICD-10-CM | POA: Diagnosis not present

## 2022-07-18 DIAGNOSIS — H353221 Exudative age-related macular degeneration, left eye, with active choroidal neovascularization: Secondary | ICD-10-CM | POA: Diagnosis not present

## 2022-07-18 DIAGNOSIS — J9601 Acute respiratory failure with hypoxia: Secondary | ICD-10-CM | POA: Diagnosis not present

## 2022-07-18 DIAGNOSIS — E871 Hypo-osmolality and hyponatremia: Secondary | ICD-10-CM | POA: Diagnosis not present

## 2022-07-18 DIAGNOSIS — K219 Gastro-esophageal reflux disease without esophagitis: Secondary | ICD-10-CM | POA: Diagnosis not present

## 2022-07-18 DIAGNOSIS — R488 Other symbolic dysfunctions: Secondary | ICD-10-CM | POA: Diagnosis not present

## 2022-07-18 DIAGNOSIS — M6281 Muscle weakness (generalized): Secondary | ICD-10-CM | POA: Diagnosis not present

## 2022-07-18 MED ORDER — ALBUTEROL SULFATE HFA 108 (90 BASE) MCG/ACT IN AERS: 2.0000 | INHALATION_SPRAY | Freq: Four times a day (QID) | RESPIRATORY_TRACT | 2 refills | Status: DC | PRN

## 2022-07-18 MED ORDER — SODIUM CHLORIDE 0.9 % IV SOLN
2.0000 g | Freq: Once | INTRAVENOUS | Status: AC
  Administered 2022-07-18: 2 g via INTRAVENOUS
  Filled 2022-07-18: qty 20

## 2022-07-18 MED ORDER — ALPRAZOLAM 0.5 MG PO TABS: 0.5000 mg | ORAL_TABLET | Freq: Every evening | ORAL | 0 refills | Status: DC | PRN

## 2022-07-18 MED ORDER — GUAIFENESIN ER 600 MG PO TB12: 600.0000 mg | ORAL_TABLET | Freq: Two times a day (BID) | ORAL | 2 refills | Status: AC

## 2022-07-18 MED ORDER — NYAMYC 100000 UNIT/GM EX POWD: 1.0000 | Freq: Two times a day (BID) | CUTANEOUS | 0 refills | Status: DC | PRN

## 2022-07-18 MED ORDER — AZITHROMYCIN 250 MG PO TABS
500.0000 mg | ORAL_TABLET | Freq: Once | ORAL | Status: AC
  Administered 2022-07-18: 500 mg via ORAL
  Filled 2022-07-18: qty 2

## 2022-07-18 MED ORDER — RISPERIDONE 0.5 MG PO TABS: 0.5000 mg | ORAL_TABLET | Freq: Two times a day (BID) | ORAL | 3 refills | Status: DC

## 2022-07-18 MED ORDER — SERTRALINE HCL 50 MG PO TABS: 50.0000 mg | ORAL_TABLET | Freq: Every day | ORAL | 3 refills | Status: DC

## 2022-07-18 MED ORDER — HYDROCODONE-ACETAMINOPHEN 10-325 MG PO TABS: 1.0000 | ORAL_TABLET | ORAL | 0 refills | Status: DC

## 2022-07-18 MED ORDER — AZITHROMYCIN 500 MG PO TABS: 500.0000 mg | ORAL_TABLET | Freq: Every day | ORAL | 0 refills | Status: AC

## 2022-07-18 MED ORDER — IBUPROFEN 400 MG PO TABS
400.0000 mg | ORAL_TABLET | Freq: Once | ORAL | Status: AC
  Administered 2022-07-18: 400 mg via ORAL
  Filled 2022-07-18: qty 1

## 2022-07-18 MED ORDER — ASPIRIN 81 MG PO TBEC: 81.0000 mg | DELAYED_RELEASE_TABLET | Freq: Every day | ORAL | 2 refills | Status: AC

## 2022-07-18 MED ORDER — CEFDINIR 300 MG PO CAPS: 300.0000 mg | ORAL_CAPSULE | Freq: Two times a day (BID) | ORAL | 0 refills | Status: AC

## 2022-07-18 MED ORDER — ALBUTEROL SULFATE (2.5 MG/3ML) 0.083% IN NEBU: 2.5000 mg | INHALATION_SOLUTION | RESPIRATORY_TRACT | 12 refills | Status: DC | PRN

## 2022-07-18 MED ORDER — PANTOPRAZOLE SODIUM 40 MG PO TBEC: 40.0000 mg | DELAYED_RELEASE_TABLET | Freq: Every day | ORAL | 3 refills | Status: DC

## 2022-07-18 NOTE — Progress Notes (Signed)
Mobility Specialist Progress Note:   07/18/22 1023  Mobility  Activity Transferred from bed to chair  Level of Assistance Minimal assist, patient does 75% or more  Assistive Device Front wheel walker  Distance Ambulated (ft) 3 ft  Range of Motion/Exercises Active;All extremities  Activity Response Tolerated well  Mobility Referral Yes  $Mobility charge 1 Mobility  Mobility Specialist Start Time (ACUTE ONLY) 1015  Mobility Specialist Stop Time (ACUTE ONLY) 1023  Mobility Specialist Time Calculation (min) (ACUTE ONLY) 8 min   Pt agreeable to mobility session, received in bed. Transferred B>C via RW and MinA. Tolerated well, asx throughout. Left pt in chair, alarm on, phone and call bell in reach, all needs met.   Feliciana Rossetti Mobility Specialist Please contact via Special educational needs teacher or  Rehab office at [phone]

## 2022-07-18 NOTE — Discharge Summary (Signed)
Beverly [name], is a 89 y.o. female  DOB [birth year]  MRN [id number].  Admission date:  07/14/2022  Admitting Physician  Onnie Boer, MD  Discharge Date:  07/18/2022   Primary MD  Benita Stabile, MD  Recommendations for primary care physician for things to follow:   1) please complete antibiotics as prescribed  2)Repeat CBC and BMP Blood tests in 1 week  3) patient has been weaned off oxygen at this time, please recheck O2 sats daily for the next 3 to 4 days to see if patient needs to be put back on Oxygen  Admission Diagnosis  Acute respiratory failure with hypoxia (HCC) [J96.01] PNA (pneumonia) [J18.9] Pneumonia of both upper lobes due to infectious organism [J18.9]   Discharge Diagnosis  Acute respiratory failure with hypoxia (HCC) [J96.01] PNA (pneumonia) [J18.9] Pneumonia of both upper lobes due to infectious organism [J18.9]    Principal Problem:   PNA (pneumonia) Active Problems:   Hyponatremia   Acute hypoxic respiratory failure (HCC)   Acute metabolic encephalopathy   COPD (chronic obstructive pulmonary disease) (HCC)   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 STEMI with cardiac arrest (polymorphic VT) s/p DES to RCA.   GERD (gastroesophageal reflux disease)    Headache(784.0)    Hearing loss, central    Left ear   Hematoma    Hypertension    Memory changes    Mild aortic stenosis    Mild carotid artery disease (HCC)    a. 1-39% by duplex 02/2018.   Seizures (HCC) 10/2012   No  seizures, speech issues; after a fall, hitting head on left side   UTI (urinary tract infection)    June 2022    Past Surgical History:  Procedure Laterality Date   ABDOMINAL HYSTERECTOMY     APPENDECTOMY     CARDIAC CATHETERIZATION     15 yrs ago   CORONARY/GRAFT ACUTE MI REVASCULARIZATION N/A 01/10/2018   Procedure: Coronary/Graft Acute MI Revascularization;  Surgeon: Runell Gess, MD;  Location: [facility];  Service: Cardiovascular;  Laterality: N/A;   CRANIOTOMY Left 10/30/2012   Procedure: CRANIOTOMY HEMATOMA EVACUATION SUBDURAL;  Surgeon: Maeola Harman, MD;  Location: [facility] NEURO ORS;  Service: Neurosurgery;  Laterality: Left;  Left Craniotomy for evacuation of subdural hematoma   JOINT REPLACEMENT Bilateral    knees   LEFT HEART CATH AND CORONARY ANGIOGRAPHY N/A 01/10/2018   Procedure: LEFT HEART CATH AND CORONARY ANGIOGRAPHY;  Surgeon: Runell Gess, MD;  Location: [facility];  Service: Cardiovascular;  Laterality: N/A;     HPI  from the history and physical done  on the day of admission:   Chief Complaint: Weakness, cough   HPI: Beverly [name] is a 89 y.o. female with medical history significant for STEMI, Cardiac arrest, COPD, coronary artery disease, subdural hematoma, hypertension, seizures. Patient was brought to the ED via EMS with reports of weakness, cough, headache, over the past several days. She went to see her primary care provider last week, and she was told to come to the ED today, and that her O2 sats had been low.  Reports pain to the right upper side of her head, and headaches, she has not had any falls.  There is no lesion or bumps to her head. On my evaluation patient is awake alert and able to answer questions.  She denies chest pain, no abdominal pain, no urinary symptoms.  Reports some confused speech over the past week.  She has had poor oral intake.  No lower extremity swelling.   ED Course: Tmax 98.3.  Heart rate 70s to 80s.  Respiratory  20.  Blood pressure systolic 130s to 166A.  O2 sats 91% on 3 L nasal cannula. WBC 14.  Sodium 128.  BNP 154.  Chest x-ray showing cardiomegaly, bilateral upper lobe opacities-edema or infection. IV ceftriaxone and azithromycin started. 500 mill bolus given.   Review of Systems: As per HPI all other systems reviewed and negative.   Hospital Course:   Brief Narrative:  89 y.o. female with medical history significant for STEMI, Cardiac arrest, COPD, coronary artery disease, subdural hematoma, hypertension, seizures admitted on 07/14/2022 with acute hypoxic respiratory failure secondary to CAP     -Assessment and Plan: 1)PNA---CAP - COVID-negative.   -Chest x-ray and  clinical evaluation consistent with pneumonia -WBC 15.1>>13.8 --- No fevers -Hypoxia resolved -Cough and dyspnea improved -- Treated with bronchodilators, mucolytics and IV ceftriaxone and azithromycin -Okay to discharge on p.o. Omnicef and azithromycin along with bronchodilators and mucolytics -Repeat CBC within the week   2)Acute metabolic encephalopathy Likely secondary to pneumonia.   -CT head without acute findings patient does have old lacunar infarcts in the bilateral basal ganglia -Mentation appears back to baseline   3)Acute hypoxic respiratory failure (HCC) --Secondary to pneumonia as above #1  -Weaned off oxygen at this time   4)Hyponatremia Sodium 128 on admission.  Baseline 131-134. -Sodium improved to 133 after IV fluids -Repeat BMP within a week   Coronary artery disease with angina pectoris (HCC) History of ST elevation MI and cardiac arrest 2020, with successful PCI/DES. -Continue aspirin, statins, carvedilol   COPD (chronic obstructive pulmonary disease) (HCC) Stable. -Bronchodilators and mucolytics as prescribed   Major depression, recurrent, chronic (HCC) C/n risperidone, Xanax 0.5 as needed at bedtime   HTN (hypertension) Stable. 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   Service: Skilled Nursing Contact information: [address] [place] [address]  [phone]                    Diet and Activity recommendation:  As advised  Discharge Instructions   Discharge Instructions     Call MD for:  difficulty breathing, headache or visual disturbances   Complete by: As directed    Call MD for:  persistant dizziness or light-headedness   Complete by: As directed    Call MD for:  persistant nausea and vomiting   Complete by: As directed    Call MD for:  temperature >100.4   Complete by: As directed    Diet - low sodium heart healthy   Complete by: As directed    Discharge instructions   Complete by: As directed    1) please complete antibiotics as prescribed  2)Repeat CBC and BMP Blood tests in 1 week  3) patient has been weaned off oxygen at this time, please recheck O2 sats daily for the next 3 to 4 days to see if patient needs to be put back on Oxygen   Increase activity slowly   Complete by: As directed         Discharge Medications     Allergies as of 07/18/2022       Reactions   Lisinopril Swelling   Egg-derived Products Itching   Patient reports that she can eat eggs but does not tolerate egg derived products   Morphine And Codeine    Headache    Penicillins Itching           Medication List     STOP taking these medications    aspirin 81 MG chewable tablet Replaced by: aspirin EC 81 MG tablet       TAKE these medications    acetaminophen 500 MG tablet Commonly known as: TYLENOL Take 500 mg by mouth every 4 (four) hours.   albuterol 108 (90 Base) MCG/ACT inhaler Commonly known as: VENTOLIN HFA Inhale 2 puffs into the lungs every 6 (six) hours as needed for wheezing or shortness of breath.   albuterol (2.5 MG/3ML) 0.083% nebulizer solution Commonly known as: PROVENTIL Take 3 mLs (2.5 mg total) by nebulization every 2 (two) hours as needed for wheezing or shortness of breath.   ALPRAZolam 0.5 MG tablet Commonly known as: XANAX Take 1 tablet (0.5 mg total) by mouth at bedtime as needed for anxiety.   amLODipine 10 MG tablet Commonly known as: NORVASC Take 10 mg by mouth daily.   aspirin EC 81 MG tablet Take 1 tablet (81 mg total) by mouth daily with breakfast. Replaces: aspirin 81 MG chewable tablet   atorvastatin 80 MG tablet Commonly known as: LIPITOR Take 1 tablet (80 mg total) by mouth daily at 6 PM.   azithromycin 500 MG tablet Commonly known as: ZITHROMAX Take 1 tablet (500 mg total) by mouth daily for 3 days.   carvedilol 12.5 MG tablet Commonly known as: COREG Take 12.5 mg by mouth 2 (two) times daily.   cefdinir 300 MG capsule Commonly known as: OMNICEF Take 1 capsule (300 mg total) by mouth 2 (two) times daily for 5 days.   celecoxib 200 MG capsule Commonly known as: CELEBREX Take 200 mg by mouth 2 (two) times daily.   Cholecalciferol 25 MCG (1000 UT) Tbdp Take 1,000 Units by mouth daily.   COLON CLEANSE PO Take 1 capsule by mouth daily as needed (Constipation).   ENSURE ENLIVE PO Take 237 mLs by mouth daily.   famotidine 20 MG tablet  Commonly known as: PEPCID Take 20 mg by mouth daily.   gabapentin 100 MG capsule Commonly known as: NEURONTIN Take 100 mg by mouth at  bedtime.   guaiFENesin 600 MG 12 hr tablet Commonly known as: Mucinex Take 1 tablet (600 mg total) by mouth 2 (two) times daily.   HYDROcodone-acetaminophen 10-325 MG tablet Commonly known as: NORCO Take 1 tablet by mouth See admin instructions. Take one tablet by mouth every night at bedtime in addition to every 6 hours as needed for pain.   latanoprost 0.005 % ophthalmic solution Commonly known as: XALATAN INSTILL ONE DROP IN BOTH EYES NIGHTLY   levETIRAcetam 500 MG tablet Commonly known as: KEPPRA Take 500 mg by mouth 2 (two) times daily.   memantine 5 MG tablet Commonly known as: NAMENDA Take 1 tablet (5 mg total) by mouth 2 (two) times daily.   methocarbamol 500 MG tablet Commonly known as: ROBAXIN Take 500 mg by mouth 4 (four) times daily.   nitroGLYCERIN 0.4 MG SL tablet Commonly known as: NITROSTAT Place 1 tablet (0.4 mg total) under the tongue every 5 (five) minutes x 3 doses as needed for chest pain.   Nyamyc powder Generic drug: nystatin Apply 1 Application topically 2 (two) times daily as needed (Irritation).   pantoprazole 40 MG tablet Commonly known as: PROTONIX Take 1 tablet (40 mg total) by mouth daily.   risperiDONE 0.5 MG tablet Commonly known as: RISPERDAL Take 1 tablet (0.5 mg total) by mouth 2 (two) times daily.   sertraline 50 MG tablet Commonly known as: ZOLOFT Take 1 tablet (50 mg total) by mouth daily.   SYSTANE BALANCE OP Place 2 drops into both eyes daily as needed (dry eyes).        Major procedures and Radiology Reports - PLEASE review detailed and final reports for all details, in brief -   CT HEAD WO CONTRAST ( )  Result Date: 07/15/2022 CLINICAL DATA:  Right-sided head pain EXAM: CT HEAD WITHOUT CONTRAST TECHNIQUE: Contiguous axial images were obtained from the base of the skull through the vertex without intravenous contrast. RADIATION DOSE REDUCTION: This exam was performed according to the departmental dose-optimization  program which includes automated exposure control, adjustment of the mA and/or kV according to patient size and/or use of iterative reconstruction technique. COMPARISON:  Head CT 02/23/2020 FINDINGS: Brain: No evidence of acute infarction, hemorrhage, hydrocephalus, extra-axial collection or mass lesion/mass effect. There is mild diffuse atrophy and mild periventricular white matter hypodensity, likely chronic small vessel ischemic change. There are old lacunar infarcts in the bilateral basal ganglia. Vascular: Atherosclerotic calcifications are present within the cavernous internal carotid arteries. Skull: Old left frontal craniotomy present.  No acute fractures. Sinuses/Orbits: No acute finding. Other: None. IMPRESSION: 1. No acute intracranial process. 2. Mild diffuse atrophy and chronic small vessel ischemic changes. 3. Old lacunar infarcts in the bilateral basal ganglia. Electronically Signed   By: Darliss Cheney M.D.   On: 07/15/2022 00:51   DG Chest 2 View  Result Date: 07/14/2022 CLINICAL DATA:  Cough.  Hypoxia.  Generalized weakness. EXAM: CHEST - 2 VIEW COMPARISON:  One view chest x-ray 11/18/2021 FINDINGS: The heart is enlarged. A large hiatal hernia is present. Atherosclerotic calcifications are present at the aortic arch. Superimposed interstitial and airspace opacities are present in the upper lobes bilaterally. Chronic elevation of the right hemidiaphragm is noted. Small effusions are suspected. Degenerative changes are noted in both shoulders. IMPRESSION: 1. Cardiomegaly without failure. 2. Superimposed interstitial and airspace opacities in the  upper lobes bilaterally. This may represent edema or infection. 3. Large hiatal hernia. Electronically Signed   By: Marin Roberts M.D.   On: 07/14/2022 17:08    Micro Results   Recent Results (from the past 240 hour(s))  SARS Coronavirus 2 by RT PCR (hospital order, performed in [facility] hospital lab) *cepheid single result test* Anterior  Nasal Swab     Status: None   Collection Time: 07/14/22  5:28 PM   Specimen: Anterior Nasal Swab  Result Value Ref Range Status   SARS Coronavirus 2 by RT PCR NEGATIVE NEGATIVE Final    Comment: (NOTE) SARS-CoV-2 target nucleic acids are NOT DETECTED.  The SARS-CoV-2 RNA is generally detectable in upper and lower respiratory specimens during the acute phase of infection. The lowest concentration of SARS-CoV-2 viral copies this assay can detect is 250 copies / mL. A negative result does not preclude SARS-CoV-2 infection and should not be used as the sole basis for treatment or other patient management decisions.  A negative result may occur with improper specimen collection / handling, submission of specimen other than nasopharyngeal swab, presence of viral mutation(s) within the areas targeted by this assay, and inadequate number of viral copies (<250 copies / mL). A negative result must be combined with clinical observations, patient history, and epidemiological information.  Fact Sheet for Patients:   RoadLapTop.co.za  Fact Sheet for Healthcare Providers: http://kim-miller.com/  This test is not yet approved or  cleared by the [place] FDA and has been authorized for detection and/or diagnosis of SARS-CoV-2 by FDA under an Emergency Use Authorization (EUA).  This EUA will remain in effect (meaning this test can be used) for the duration of the COVID-19 declaration under Section 564(b)(1) of the Act, 21 U.S.C. section 360bbb-3(b)(1), unless the authorization is terminated or revoked sooner.  Performed at [facility], [address] [place], [place] [address]   MRSA Next Gen by PCR, Nasal     Status: None   Collection Time: 07/14/22  8:50 PM   Specimen: Nasal Mucosa; Nasal Swab  Result Value Ref Range Status   MRSA by PCR Next Gen NOT DETECTED NOT DETECTED Final    Comment: (NOTE) The GeneXpert MRSA Assay (FDA approved for  NASAL specimens only), is one component of a comprehensive MRSA colonization surveillance program. It is not intended to diagnose MRSA infection nor to guide or monitor treatment for MRSA infections. Test performance is not FDA approved in patients less than 3 years old. Performed at [facility], [address], [place], [place] [address]    Today   Subjective   Carma Dwiggins today has no new complaints -Cough and dyspnea improved -Oral intake is fair -No fevers        -Weaned off oxygen successfully at this time   Patient has been seen and examined prior to discharge   Objective   Blood pressure (!) 149/76, pulse 91, temperature 98.6 F (37 C), temperature source Oral, resp. rate 18, height 5' (1.524 m), weight 65 kg, SpO2 93%.   Intake/Output Summary (Last 24 hours) at 07/18/2022 1332 Last data filed at 07/18/2022 0931 Gross per 24 hour  Intake 500 ml  Output 300 ml  Net 200 ml   Exam  Gen:- Awake Alert, in no apparent distress , head neck and head tilted to the right (baseline--Not new) HEENT:- [place].AT, No sclera icterus Neck-Supple Neck,No JVD,.  Lungs-improving air movement , no wheezing CV- S1, S2 normal, regular , 3/6 SM Abd-  +ve  B.Sounds, Abd Soft, No tenderness,    Extremity/Skin:- No  edema, pedal pulses present  Psych-affect is appropriate, oriented x3 Neuro-generalized weakness, no new focal deficits, no tremors   Data Review   CBC w Diff:  Lab Results  Component Value Date   WBC 13.8 (H) 07/17/2022   HGB 12.1 07/17/2022   HGB 9.7 (L) 01/19/2018   HCT 36.9 07/17/2022   HCT 28.4 (L) 01/19/2018   PLT 303 07/17/2022   PLT 665 (H) 01/19/2018   LYMPHOPCT 12 07/14/2022   BANDSPCT 0 01/10/2018   MONOPCT 11 07/14/2022   EOSPCT 1 07/14/2022   BASOPCT 0 07/14/2022   CMP:  Lab Results  Component Value Date   NA 133 (L) 07/17/2022   NA 141 03/12/2018   K 3.0 (L) 07/17/2022   CL 96 (L) 07/17/2022   CO2 26 07/17/2022   BUN 13 07/17/2022   BUN 15  03/12/2018   CREATININE 0.53 07/17/2022   PROT 6.5 11/18/2021   PROT 6.0 03/23/2018   ALBUMIN 2.9 (L) 11/18/2021   ALBUMIN 4.0 03/23/2018   BILITOT 0.8 11/18/2021   BILITOT 0.4 03/23/2018   ALKPHOS 93 11/18/2021   AST 22 11/18/2021   ALT 13 11/18/2021  . Total Discharge time is about 33 minutes  Shon Hale M.D on 07/18/2022 at 1:32 PM  Go to [email or web address] -  for contact info  [facility] - Office  [phone]

## 2022-07-18 NOTE — TOC Transition Note (Signed)
Transition of Care [facility]) - CM/SW Discharge Note   Patient Details  Name: Beverly [name] MRN: [id number] Date of Birth: 1932-10-21  Transition of Care [facility]) CM/SW Contact:  Annice Needy, LCSW Phone Number: 07/18/2022, 1:43 PM   Clinical Narrative:    Discharge clinicals sent to facility. Message left for daughter. Nurse to call report. TOC signing off.    Final next level of care: Skilled Nursing Facility Barriers to Discharge: No Barriers Identified   Patient Goals and CMS Choice   Choice offered to / list presented to : Patient  Discharge Placement                Patient chooses bed at: [facility] Patient to be transferred to facility by: staff Name of family member notified: message left for daughter Patient and family notified of of transfer: 07/18/22  Discharge Plan and Services Additional resources added to the After Visit Summary for   In-house Referral: Clinical Social Work                                   Social Determinants of Health (SDOH) Interventions SDOH Screenings   Food Insecurity: No Food Insecurity (07/14/2022)  Housing: Low Risk  (07/14/2022)  Transportation Needs: Patient Declined (07/14/2022)  Utilities: Patient Declined (07/14/2022)  Tobacco Use: Low Risk  (07/14/2022)     Readmission Risk Interventions    07/17/2022    1:51 PM 05/04/2020   11:46 AM  Readmission Risk Prevention Plan  Transportation Screening Complete Complete  PCP or Specialist Appt within 5-7 Days Complete   Home Care Screening Complete Complete  Medication Review (RN CM) Complete Complete

## 2022-07-18 NOTE — Care Management Important Message (Signed)
Important Message  Patient Details  Name: MOESHA SARCHET MRN: [id number] Date of Birth: [birth year]   Medicare Important Message Given:  Yes     Corey Harold 07/18/2022, 4:17 PM

## 2022-07-18 NOTE — Progress Notes (Signed)
Called report to receiving LPN at [facility] nursing [facility].

## 2022-07-18 NOTE — Progress Notes (Signed)
Telemetry called, Patient HR got up to 154 nonsustained. MD Thomes Dinning made aware.

## 2022-07-18 NOTE — Progress Notes (Signed)
Patient c/o pain in her neck and head, R side. 6/10. PRN Tylenol given but no relief. MD Lenard Forth, received order for one time dose of Ibuprofen.

## 2022-07-18 NOTE — Consult Note (Signed)
[facility] Customer service manager [facility]) Accountable Care Organization (ACO) [facility] Liaison Note  07/18/2022  Beverly [name] [birth year] [id number]  Location: [facility] RN Hospital Liaison screened the patient remotely at [facility].  Insurance: [employer]   Beverly [name] is a 89 y.o. female who is a Primary Care Patient of Margo Aye, Kathleene Hazel, MD. The patient was screened for readmission hospitalization with noted high risk score for unplanned readmission risk with 1 IP in 6 months.  The patient was assessed for potential [facility]) Care Management service needs for post hospital transition for care coordination. Review of patient's electronic medical record reveals patient was admitted with Pneumonia. Pt discharged to SNF [facility]) and the facility will continue to manage pt's needs.   [facility] [place] Care Management/Population Health does not replace or interfere with any arrangements made by the Inpatient Transition of Care team.   For questions contact:   Elliot Cousin, RN, [facility] Liaison [place]   Population Health Office Hours MTWF  8:00 am-6:00 pm Off on Thursday [phone] mobile [phone] [Office toll free line] Office Hours are M-F 8:30 - 5 pm 24 hour nurse advise line [phone] [email or web address]  [email or web address]

## 2022-07-19 ENCOUNTER — Non-Acute Institutional Stay (SKILLED_NURSING_FACILITY): Payer: Medicare Other | Admitting: Adult Health

## 2022-07-19 ENCOUNTER — Encounter: Payer: Self-pay | Admitting: Adult Health

## 2022-07-19 DIAGNOSIS — J9601 Acute respiratory failure with hypoxia: Secondary | ICD-10-CM | POA: Diagnosis not present

## 2022-07-19 DIAGNOSIS — F339 Major depressive disorder, recurrent, unspecified: Secondary | ICD-10-CM

## 2022-07-19 DIAGNOSIS — K219 Gastro-esophageal reflux disease without esophagitis: Secondary | ICD-10-CM | POA: Diagnosis not present

## 2022-07-19 DIAGNOSIS — J42 Unspecified chronic bronchitis: Secondary | ICD-10-CM | POA: Diagnosis not present

## 2022-07-19 DIAGNOSIS — I700 Atherosclerosis of aorta: Secondary | ICD-10-CM | POA: Diagnosis not present

## 2022-07-19 DIAGNOSIS — E785 Hyperlipidemia, unspecified: Secondary | ICD-10-CM | POA: Diagnosis not present

## 2022-07-19 DIAGNOSIS — J189 Pneumonia, unspecified organism: Secondary | ICD-10-CM | POA: Diagnosis not present

## 2022-07-19 DIAGNOSIS — G3184 Mild cognitive impairment, so stated: Secondary | ICD-10-CM

## 2022-07-19 DIAGNOSIS — I25119 Atherosclerotic heart disease of native coronary artery with unspecified angina pectoris: Secondary | ICD-10-CM

## 2022-07-19 DIAGNOSIS — E876 Hypokalemia: Secondary | ICD-10-CM

## 2022-07-19 DIAGNOSIS — G8929 Other chronic pain: Secondary | ICD-10-CM

## 2022-07-19 DIAGNOSIS — M1990 Unspecified osteoarthritis, unspecified site: Secondary | ICD-10-CM

## 2022-07-19 DIAGNOSIS — G40109 Localization-related (focal) (partial) symptomatic epilepsy and epileptic syndromes with simple partial seizures, not intractable, without status epilepticus: Secondary | ICD-10-CM

## 2022-07-19 DIAGNOSIS — H40053 Ocular hypertension, bilateral: Secondary | ICD-10-CM

## 2022-07-19 DIAGNOSIS — E441 Mild protein-calorie malnutrition: Secondary | ICD-10-CM

## 2022-07-19 NOTE — Progress Notes (Signed)
Location:  [facility] Room Number: 159 Place of Service:  SNF (31)   CODE STATUS: dnr   Allergies  Allergen Reactions   Lisinopril Swelling   Egg-Derived Products Itching    Patient reports that she can eat eggs but does not tolerate egg derived products   Morphine And Codeine     Headache   Penicillins Itching         Chief Complaint  Patient presents with   Hospitalization Follow-up    HPI:  She is a 89 year old woman who has been hospitalized from 07-14-22 through 07-18-22. Her medical history included: COPD; CAD; stemi;  hypertension; depression; epilepsy; subdural hematoma. She presented to the ED  with reports of weakness, cough, headache. She had been to see primary care provider last week, and was told to go to the ED. She was admitted with acute respiratory failure with hypoxia and CAP. She was started on IV abt then transitioned to oral antibiotics. She was weaned off 02 while in the hospital. Overnight her 02 sats dropped to the 80's and required her 02 to be restarted. She is here for short term rehab with her goal to return back home. 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 STEMI with cardiac arrest (polymorphic VT) s/p DES to RCA.   GERD (gastroesophageal reflux disease)    Headache(784.0)    Hearing loss, central    Left ear   Hematoma    Hypertension    Memory changes    Mild aortic stenosis    Mild carotid artery disease (HCC)    a. 1-39% by duplex 02/2018.    Seizures (HCC) 10/2012   No seizures, speech issues; after a fall, hitting head on left side   UTI (urinary tract infection)    June 2022    Past Surgical History:  Procedure Laterality Date   ABDOMINAL HYSTERECTOMY     APPENDECTOMY     CARDIAC CATHETERIZATION     15 yrs ago   CORONARY/GRAFT ACUTE MI REVASCULARIZATION N/A 01/10/2018   Procedure: Coronary/Graft Acute MI Revascularization;  Surgeon: Runell Gess, MD;  Location: [facility] INVASIVE CV LAB;  Service: Cardiovascular;  Laterality: N/A;   CRANIOTOMY Left 10/30/2012   Procedure: CRANIOTOMY HEMATOMA EVACUATION SUBDURAL;  Surgeon: Maeola Harman, MD;  Location: [facility] NEURO ORS;  Service: Neurosurgery;  Laterality: Left;  Left Craniotomy for evacuation of subdural hematoma   JOINT REPLACEMENT Bilateral    knees   LEFT HEART CATH AND CORONARY ANGIOGRAPHY N/A 01/10/2018   Procedure: LEFT HEART CATH AND CORONARY ANGIOGRAPHY;  Surgeon: Runell Gess, MD;  Location: [facility] INVASIVE CV LAB;  Service: Cardiovascular;  Laterality: N/A;    Social History   Socioeconomic History   Marital status: Married    Spouse name: Not on file   Number of children: Not on file   Years of education: Not on file   Highest education level: Not on file  Occupational History   Not on file  Tobacco Use  Smoking status: Never   Smokeless tobacco: Never  Vaping Use   Vaping status: Never Used  Substance and Sexual Activity   Alcohol use: No   Drug use: No   Sexual activity: Not on file  Other Topics Concern   Not on file  Social History Narrative   Not on file   Social Determinants of Health   Financial Resource Strain: Not on file  Food Insecurity: No Food Insecurity (07/14/2022)   Hunger Vital Sign    Worried About Running Out of Food in the Last Year: Never true    Ran Out of Food in the Last Year: Never true  Transportation Needs: Patient Declined (07/14/2022)   PRAPARE - Administrator, Civil Service (Medical): Patient declined     Lack of Transportation (Non-Medical): Patient declined  Physical Activity: Not on file  Stress: Not on file  Social Connections: Not on file  Intimate Partner Violence: Patient Declined (07/14/2022)   Humiliation, Afraid, Rape, and Kick questionnaire    Fear of Current or Ex-Partner: Patient declined    Emotionally Abused: Patient declined    Physically Abused: Patient declined    Sexually Abused: Patient declined   Family History  Problem Relation Age of Onset   Dementia Mother    Cancer Father    Dementia Maternal Aunt    Ataxia Neg Hx    Chorea Neg Hx    Mental retardation Neg Hx    Migraines Neg Hx    Multiple sclerosis Neg Hx    Neurofibromatosis Neg Hx    Neuropathy Neg Hx    Parkinsonism Neg Hx    Seizures Neg Hx    Stroke Neg Hx       VITAL SIGNS BP (!) 140/82   Pulse 83   Temp 98.1 F (36.7 C)   Resp 16   Ht 5' (1.524 m)   Wt 137 lb 3.2 oz (62.2 kg)   SpO2 (!) 85%   BMI 26.80 kg/m   Outpatient Encounter Medications as of 07/19/2022  Medication Sig Note   acetaminophen (TYLENOL) 500 MG tablet Take 500 mg by mouth every 4 (four) hours.    albuterol (PROVENTIL) (2.5 MG/3ML) 0.083% nebulizer solution Take 3 mLs (2.5 mg total) by nebulization every 2 (two) hours as needed for wheezing or shortness of breath.    albuterol (VENTOLIN HFA) 108 (90 Base) MCG/ACT inhaler Inhale 2 puffs into the lungs every 6 (six) hours as needed for wheezing or shortness of breath.    ALPRAZolam (XANAX) 0.5 MG tablet Take 1 tablet (0.5 mg total) by mouth at bedtime as needed for anxiety.    amLODipine (NORVASC) 10 MG tablet Take 10 mg by mouth daily. 07/15/2022: Patient typically takes 10mg  every day, daughter decided to give 5mg  instead of 10 yesterday due to low BP.    aspirin EC 81 MG tablet Take 1 tablet (81 mg total) by mouth daily with breakfast.    atorvastatin (LIPITOR) 80 MG tablet Take 1 tablet (80 mg total) by mouth daily at 6 PM.    azithromycin (ZITHROMAX) 500 MG tablet  Take 1 tablet (500 mg total) by mouth daily for 3 days.    carvedilol (COREG) 12.5 MG tablet Take 12.5 mg by mouth 2 (two) times daily. 07/15/2022: Patient typically takes 12.5mg  BID, but daughter only gave 6.25mg  yesterday morning due to low BP.    cefdinir (OMNICEF) 300 MG capsule Take 1 capsule (300 mg total) by mouth 2 (two) times daily for  5 days.    celecoxib (CELEBREX) 200 MG capsule Take 200 mg by mouth 2 (two) times daily.    Cholecalciferol 25 MCG (1000 UT) TBDP Take 1,000 Units by mouth daily.    famotidine (PEPCID) 20 MG tablet Take 20 mg by mouth daily.    gabapentin (NEURONTIN) 100 MG capsule Take 100 mg by mouth at bedtime.    guaiFENesin (MUCINEX) 600 MG 12 hr tablet Take 1 tablet (600 mg total) by mouth 2 (two) times daily.    HYDROcodone-acetaminophen (NORCO) 10-325 MG tablet Take 1 tablet by mouth See admin instructions. Take one tablet by mouth every night at bedtime in addition to every 6 hours as needed for pain.    latanoprost (XALATAN) 0.005 % ophthalmic solution INSTILL ONE DROP IN BOTH EYES NIGHTLY    levETIRAcetam (KEPPRA) 500 MG tablet Take 500 mg by mouth 2 (two) times daily.    memantine (NAMENDA) 5 MG tablet Take 1 tablet (5 mg total) by mouth 2 (two) times daily.    methocarbamol (ROBAXIN) 500 MG tablet Take 500 mg by mouth 4 (four) times daily.    Misc Natural Products (COLON CLEANSE PO) Take 1 capsule by mouth daily as needed (Constipation).    nitroGLYCERIN (NITROSTAT) 0.4 MG SL tablet Place 1 tablet (0.4 mg total) under the tongue every 5 (five) minutes x 3 doses as needed for chest pain.    Nutritional Supplements (ENSURE ENLIVE PO) Take 237 mLs by mouth daily.    NYAMYC powder Apply 1 Application topically 2 (two) times daily as needed (Irritation).    pantoprazole (PROTONIX) 40 MG tablet Take 1 tablet (40 mg total) by mouth daily.    Propylene Glycol (SYSTANE BALANCE OP) Place 2 drops into both eyes daily as needed (dry eyes).    risperiDONE (RISPERDAL) 0.5 MG  tablet Take 1 tablet (0.5 mg total) by mouth 2 (two) times daily.    sertraline (ZOLOFT) 50 MG tablet Take 1 tablet (50 mg total) by mouth daily.    [DISCONTINUED] lisinopril (ZESTRIL) 20 MG tablet Take 1 tablet (20 mg total) by mouth 2 (two) times daily.    No facility-administered encounter medications on file as of 07/19/2022.     SIGNIFICANT DIAGNOSTIC EXAMS  TODAY  07-14-22: chest x-ray:  1. Cardiomegaly without failure. 2. Superimposed interstitial and airspace opacities in the upper lobes bilaterally. This may represent edema or infection. 3. Large hiatal hernia.  07-14-22: ct of head:  1. No acute intracranial process. 2. Mild diffuse atrophy and chronic small vessel ischemic changes. 3. Old lacunar infarcts in the bilateral basal ganglia.  LABS REVIEWED:   07-14-22: wbc 14.0; hgb 11.3; hct 34.9; mcv 89.5 plt 335; glucose 126; bun 23; creat 0.64; k+ 4.5; na++ 128; ca 9.3; gfr >60; BNP 154 07-17-22: wbc 13.8; hgb 12.1; hct 36.9; mcv 89.6 plt 303; glucose 106; bun 13; creat 0.53; k+ 3.0; na++ 133;ca 8.8 gfr >60   Review of Systems  Constitutional:  Negative for malaise/fatigue.  Respiratory:  Negative for cough and shortness of breath.   Cardiovascular:  Negative for chest pain, palpitations and leg swelling.  Gastrointestinal:  Negative for abdominal pain, constipation and heartburn.  Musculoskeletal:  Negative for back pain, joint pain and myalgias.  Skin: Negative.   Neurological:  Negative for dizziness.  Psychiatric/Behavioral:  The patient is not nervous/anxious.    Physical Exam Constitutional:      General: She is not in acute distress.    Appearance: She is well-developed. She is not  diaphoretic.  Neck:     Thyroid: No thyromegaly.  Cardiovascular:     Rate and Rhythm: Normal rate and regular rhythm.     Heart sounds: Normal heart sounds.  Pulmonary:     Effort: Pulmonary effort is normal. No respiratory distress.     Breath sounds: Normal breath sounds.   Abdominal:     General: Bowel sounds are normal. There is no distension.     Palpations: Abdomen is soft.     Tenderness: There is no abdominal tenderness.  Musculoskeletal:        General: Normal range of motion.     Cervical back: Neck supple.     Right lower leg: No edema.     Left lower leg: No edema.  Lymphadenopathy:     Cervical: No cervical adenopathy.  Skin:    General: Skin is warm and dry.  Neurological:     Mental Status: She is alert and oriented to person, place, and time.       ASSESSMENT/ PLAN:  TODAY  Acute hypoxic respiratory failure/CAP: will continue 02 at this time due to low 02 sats. Will continue zithromax 500 mg daily for 3 days and omnicef 300 mg twice daily for 5 days.   2. Chronic bronchitis unspecified bronchitis type: mucinex 600 mg twice daily albuterol neb and inhaler as needed   3. 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 Aortic atherosclerosis is on asa and statin  5. 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 GERD without esophagitis: will continue pepcid 20 mg daily and protonix 40 mg daily   7. Dyslipidemia (high ldl; low hdl): will continue lipitor 80 mg daily   8. Increased intraocular pressure bilateral: will continue xalatan to both eyes   9. Major depression; chronic recurrent: will continue zoloft 50 mg daily; risperal 0.5 mg twice daily; xanax 0.5 mg nightly as needed  10.  MCI (mild cognitive impairment) will continue namenda 5 mg twice daily   11. Hypokalemia: k+ 3.0 will repeat labs  12. Arthritis/other chronic pain: will continue celebrex 200 mg twice daily; gabapentin 100 mg nightly robaxin 500 mg four times daily vicodin 10/325 mg nightly and every 6 hours  as needed  13. Essential hypertension: b/p 140/82: will continue norvasc 10 mg daily coreg 12.5 mg twice daily asa 81 mg daily   14. Mild protein calorie malnutrition: will continue ensure daily   Synthia Innocent NP [facility]  call [phone]

## 2022-07-22 ENCOUNTER — Other Ambulatory Visit: Payer: Self-pay | Admitting: *Deleted

## 2022-07-22 NOTE — Patient Outreach (Signed)
Per [facility] Mrs. Lopp resides in [facility] skilled nursing facility. Screening for potential care coordination services as benefit of health plan and Primary Care Provider.  Will follow for potential care coordination services and transition plans/needs.   Raiford Noble, MSN, RN,BSN [facility] Post Acute Care Coordinator [phone] (Direct dial)

## 2022-07-25 ENCOUNTER — Other Ambulatory Visit (HOSPITAL_COMMUNITY)
Admission: RE | Admit: 2022-07-25 | Discharge: 2022-07-25 | Disposition: A | Discharge status: Home / Self Care | Payer: Medicare Other | Source: Skilled Nursing Facility | Attending: Adult Health | Admitting: Adult Health

## 2022-07-25 ENCOUNTER — Encounter: Payer: Self-pay | Admitting: Internal Medicine

## 2022-07-25 ENCOUNTER — Non-Acute Institutional Stay: Payer: Self-pay | Admitting: Internal Medicine

## 2022-07-25 DIAGNOSIS — D649 Anemia, unspecified: Secondary | ICD-10-CM | POA: Insufficient documentation

## 2022-07-25 DIAGNOSIS — G9341 Metabolic encephalopathy: Secondary | ICD-10-CM | POA: Diagnosis not present

## 2022-07-25 DIAGNOSIS — E871 Hypo-osmolality and hyponatremia: Secondary | ICD-10-CM

## 2022-07-25 DIAGNOSIS — D638 Anemia in other chronic diseases classified elsewhere: Secondary | ICD-10-CM | POA: Diagnosis not present

## 2022-07-25 DIAGNOSIS — J9601 Acute respiratory failure with hypoxia: Secondary | ICD-10-CM

## 2022-07-25 LAB — BASIC METABOLIC PANEL
Anion gap: 7 (ref 5–15)
BUN: 13 mg/dL (ref 8–23)
CO2: 30 mmol/L (ref 22–32)
Calcium: 8.6 mg/dL — ABNORMAL LOW (ref 8.9–10.3)
Chloride: 93 mmol/L — ABNORMAL LOW (ref 98–111)
Creatinine, Ser: 0.55 mg/dL (ref 0.44–1.00)
GFR, Estimated: >60 mL/min (ref >60)
Glucose, Bld: 78 mg/dL (ref 70–99)
Potassium: 3.8 mmol/L (ref 3.5–5.1)
Sodium: 130 mmol/L — ABNORMAL LOW (ref 135–145)

## 2022-07-25 LAB — CBC
HCT: 32.4 % — ABNORMAL LOW (ref 36.0–46.0)
Hemoglobin: 10.2 g/dL — ABNORMAL LOW (ref 12.0–15.0)
MCH: 28.8 pg (ref 26.0–34.0)
MCHC: 31.5 g/dL (ref 30.0–36.0)
MCV: 91.5 fL (ref 80.0–100.0)
Platelets: 319 10*3/uL (ref 150–400)
RBC: 3.54 MIL/uL — ABNORMAL LOW (ref 3.87–5.11)
RDW: 15.3 % (ref 11.5–15.5)
WBC: 9.9 10*3/uL (ref 4.0–10.5)
nRBC: 0 % (ref 0.0–0.2)

## 2022-07-25 NOTE — Assessment & Plan Note (Signed)
07/25/2022 sodium is 130; med list reviewed no obvious iatrogenic cause.  Continue to monitor.

## 2022-07-25 NOTE — Assessment & Plan Note (Signed)
7/11 - 07/18/2022 admission H/H 11.3/34.9 with normochromic, normocytic indices.  Predischarge value was 12.1/36.9. 07/25/2022 H/H10.2/32.4 here at [facility].  This may reflect rehydration as no bleeding dyscrasias reported.  Continue to monitor.

## 2022-07-25 NOTE — Progress Notes (Unsigned)
NURSING HOME LOCATION:  [facility] ROOM NUMBER:  159 P  CODE STATUS:  DNR  PCP:  Kathleene Hazel. Margo Aye MD  This is a comprehensive admission note to this SNFperformed on this date less than 30 days from date of admission. Included are preadmission medical/surgical history; reconciled medication list; family history; social history and comprehensive review of systems.  Corrections and additions to the records were documented. Comprehensive physical exam was also performed. Additionally a clinical summary was entered for each active diagnosis pertinent to this admission in the Problem List to enhance continuity of care.  HPI: The patient was hospitalized 7/11 - 07/18/2022 for acute respiratory failure with hypoxia in the context of bilateral upper lobe pneumonia.  She presented with several days of weakness, and headache.  Imaging revealed cardiomegaly and bilateral upper lobe opacities.  OT empirically IV ceftriaxone and azithromycin were initiated. WBC varied from 13,800 to 15,1000.  Normochromic, normocytic anemia was present with H/H of 11.3/34.9 at admission.  Predischarge H/H was 12.1/36.9. Course was complicated by acute metabolic encephalopathy; cns imaging old lacunar infarcts . Hyponatremia was present with a sodium of 128; this improved to 133 after IV rehydration. PT/OT recommended SNF placement for rehab because of generalized weakness and deconditioning.  PTA the patient had been able to transfer and walk with a walker for short distances but required wheelchair for mobilization beyond such. She was transitioned to [facility] & azithromycin at discharge.  Past medical and surgical history: includes CAD with history of STEMI and cardiac arrest,COPD,GERD, hypertension, aortic stenosis, history of depression, and history of seizures. Surgeries and procedures include abdominal hysterectomy, cardiac cath, coronary revascularization, and craniotomy for subdural hematoma.  Social  history: Nondrinker, non-smoker.  She is a retired Engineer, civil (consulting).  Family history: Noncontributory due to advanced age.   Review of systems: She is alert and oriented and provides an excellent history.  She was cognizant of the diagnoses while hospitalized.  At this time she states that her breathing is much better.  She denies any infectious symptoms.  She does describe intermittent slight right frontal headache.  She has some dysphagia.  Stools have been loose here at the SNF.  She has noted minor swelling of the left lower extremity.  She does describe some anxiety.  She has numbness greater in the hands than in the feet.  Constitutional: No fever, significant weight change, fatigue  Eyes: No redness, discharge, pain, vision change ENT/mouth: No nasal congestion, purulent discharge, earache, change in hearing, sore throat  Cardiovascular: No chest pain, palpitations, paroxysmal nocturnal dyspnea, claudication, edema  Respiratory: No cough, sputum production, hemoptysis, DOE, significant snoring, apnea Gastrointestinal: No heartburn, dysphagia, abdominal pain, nausea /vomiting, rectal bleeding, melena, change in bowels Genitourinary: No dysuria, hematuria, pyuria, incontinence, nocturia Musculoskeletal: No joint stiffness, joint swelling, weakness, pain Dermatologic: No rash, pruritus, change in appearance of skin Neurologic: No dizziness, headache, syncope, seizures, numbness, tingling Psychiatric: No significant anxiety, depression, insomnia, anorexia Endocrine: No change in hair/skin/nails, excessive thirst, excessive hunger, excessive urination  Hematologic/lymphatic: No significant bruising, lymphadenopathy, abnormal bleeding Allergy/immunology: No itchy/watery eyes, significant sneezing, urticaria, angioedema  Physical exam:  Pertinent or positive findings: She appears her age and somewhat chronically ill.  She has bilateral hearing aids.  Her head and neck are flexed to the right.  She has  bilateral ptosis.  Eyebrows are absent.  She is wearing nasal oxygen.  She has a blowing systolic murmur.  Second heart sounds increased.  Lordotic curve is accentuated.  She has coarse rales at the bases greater on the right than the left.  Rales are fine or superiorly.  The left posterior tibial pulse is stronger than the other pulses which are essentially not palpable. General appearance: Adequately nourished; no acute distress, increased work of breathing is present.   Lymphatic: No lymphadenopathy about the head, neck, axilla. Eyes: No conjunctival inflammation or lid edema is present. There is no scleral icterus. Ears:  External ear exam shows no significant lesions or deformities.   Nose:  External nasal examination shows no deformity or inflammation. Nasal mucosa are pink and moist without lesions, exudates Oral exam: Lips and gums are healthy appearing.There is no oropharyngeal erythema or exudate. Neck:  No thyromegaly, masses, tenderness noted.    Heart:  Normal rate and regular rhythm. S1 and S2 normal without gallop, murmur, click, rub.  Lungs: Chest clear to auscultation without wheezes, rhonchi, rales, rubs. Abdomen: Bowel sounds are normal.  Abdomen is soft and nontender with no organomegaly, hernias, masses. GU: Deferred  Extremities:  No cyanosis, clubbing, edema. Neurologic exam:  Strength equal  in upper & lower extremities. Balance, Rhomberg, finger to nose testing could not be completed due to clinical state Deep tendon reflexes are equal Skin: Warm & dry w/o tenting. No significant lesions or rash.  See clinical summary under each active problem in the Problem List with associated updated therapeutic plan

## 2022-07-25 NOTE — Assessment & Plan Note (Signed)
O2 sats are excellent on supplemental oxygen. Coarse rales present @ bases suggestive of ILD, but she denies such & interstitial changes on imaging were in upper lobes bilaterally. Continue pulmonary toilet @ SNF.

## 2022-07-25 NOTE — Patient Instructions (Signed)
See assessment and plan under each diagnosis in the problem list and acutely for this visit 

## 2022-07-25 NOTE — Assessment & Plan Note (Addendum)
She is alert & oriented w/o neurocognitive deficits clinically. Monitor sodium at SNF.

## 2022-07-26 NOTE — Progress Notes (Deleted)
  Cardiology Office Note:  .   Date:  07/26/2022  ID:  Beverly [name], DOB 1932-11-15, MRN [id number] PCP: Benita Stabile, MD  [facility] Providers Cardiologist:  Nona Dell, MD { Click to update primary MD,subspecialty MD or APP then REFRESH:1}   History of Present Illness: .   Beverly [name] is a 89 y.o. female CAD, HTN, HLD, TIA.  Last seen by [place] 2021   Patient discharged 07/18/22 after adm for acute resp failure/PNA. ROS: ***  Studies Reviewed: Marland Kitchen         Prior CV Studies: {Select studies to display:26339}  Echocardiogram 01/10/2018   Study Conclusions   - Left ventricle: The cavity size was normal. Wall thickness was    increased in a pattern of mild LVH. Basal to mid inferior    akinesis. Basal inferoseptal akinesis. The estimated ejection    fraction was 55%. Doppler parameters are consistent with abnormal    left ventricular relaxation (grade 1 diastolic dysfunction).  - Aortic valve: Trileaflet; moderately calcified leaflets. There    was mild stenosis. Mean gradient (S): 13 mm Hg. Valve area    (Vmean): 1.74 cm^2.  - Mitral valve: There was trivial regurgitation.  - Left atrium: The atrium was mildly dilated.  - Right ventricle: The cavity size was normal. Systolic function    was normal.  - Pulmonary arteries: No complete TR doppler jet so unable to    estimate PA systolic pressure.  - Inferior vena cava: The vessel was normal in size. The    respirophasic diameter changes were in the normal range (>= 50%),    consistent with normal central venous pressure.   Impressions:   - Normal LV size with mild LV hypertrophy. EF 55% with basal to mid    inferior akinesis and basal inferoseptal akinesis. Normal RV size    and systolic function. Mild aortic stenosis.    Cardiac catheterization 01/10/2018 Mid RCA lesion is 99% stenosed. A drug-eluting stent was successfully placed. Post intervention, there is a 0% residual stenosis. Diagnostic Dominance:  Right  Intervention         Risk Assessment/Calculations:   {Does this patient have ATRIAL FIBRILLATION?:[id number]}         Physical Exam:   VS:  There were no vitals taken for this visit.   Wt Readings from Last 3 Encounters:  07/19/22 137 lb 3.2 oz (62.2 kg)  07/14/22 143 lb 4.8 oz (65 kg)  12/17/21 137 lb (62.1 kg)    GEN: Well nourished, well developed in no acute distress NECK: No JVD; No carotid bruits CARDIAC: ***RRR, no murmurs, rubs, gallops RESPIRATORY:  Clear to auscultation without rales, wheezing or rhonchi  ABDOMEN: Soft, non-tender, non-distended EXTREMITIES:  No edema; No deformity   ASSESSMENT AND PLAN: .    CAD STEMI with cardiac arrest 01/10/18 treated with DESmRCA  HTN  HLD       {Are you ordering a CV Procedure (e.g. stress test, cath, DCCV, TEE, etc)?   Press F2        :[id number]}  Dispo: ***  Signed, Jacolyn Reedy, PA-C

## 2022-07-27 ENCOUNTER — Other Ambulatory Visit: Payer: Self-pay | Admitting: Adult Health

## 2022-07-27 MED ORDER — HYDROCODONE-ACETAMINOPHEN 10-325 MG PO TABS: 1.0000 | ORAL_TABLET | Freq: Every day | ORAL | 0 refills | Status: DC

## 2022-07-28 ENCOUNTER — Other Ambulatory Visit: Payer: Self-pay | Admitting: *Deleted

## 2022-07-28 NOTE — Patient Outreach (Signed)
Per [facility], Mrs. Valenta resides in [facility] skilled nursing facility. Screening for potential care coordination services as benefit of health plan and Primary Care Provider.   Update received from [place], [place] Child psychotherapist. Mrs. Manka will return home with daughter likely next week.   Telephone call made to [name] (daughter/DPR) [phone]. Patient identifiers confirmed. [name] reports insurance has given discharge date of 08/03/22. [name] states Mrs. Yokoyama is improving.   Writer explained care coordination follow up. [name] is agreeable. Writer explained [facility] care coordination services will not interfere or replace services provided by home health. [name] confirms she is primary contact. Confirmed best telephone number is [phone].  Will continue to follow and plan referral upon SNF discharge to [facility] care coordination team.   Raiford Noble, MSN, RN,BSN [facility] Post Acute Care Coordinator [phone] (Direct dial)

## 2022-08-03 ENCOUNTER — Non-Acute Institutional Stay (SKILLED_NURSING_FACILITY): Payer: Medicare Other | Admitting: Adult Health

## 2022-08-03 ENCOUNTER — Encounter: Payer: Self-pay | Admitting: Adult Health

## 2022-08-03 ENCOUNTER — Other Ambulatory Visit: Payer: Self-pay | Admitting: Adult Health

## 2022-08-03 DIAGNOSIS — J9601 Acute respiratory failure with hypoxia: Secondary | ICD-10-CM

## 2022-08-03 DIAGNOSIS — G9341 Metabolic encephalopathy: Secondary | ICD-10-CM | POA: Diagnosis not present

## 2022-08-03 DIAGNOSIS — G40109 Localization-related (focal) (partial) symptomatic epilepsy and epileptic syndromes with simple partial seizures, not intractable, without status epilepticus: Secondary | ICD-10-CM

## 2022-08-03 DIAGNOSIS — I700 Atherosclerosis of aorta: Secondary | ICD-10-CM

## 2022-08-03 DIAGNOSIS — J42 Unspecified chronic bronchitis: Secondary | ICD-10-CM | POA: Diagnosis not present

## 2022-08-03 MED ORDER — AMLODIPINE BESYLATE 10 MG PO TABS: 10.0000 mg | ORAL_TABLET | Freq: Every day | ORAL | 0 refills | Status: DC

## 2022-08-03 MED ORDER — LATANOPROST 0.005 % OP SOLN: OPHTHALMIC | 0 refills | Status: DC

## 2022-08-03 MED ORDER — GABAPENTIN 100 MG PO CAPS: 100.0000 mg | ORAL_CAPSULE | Freq: Every day | ORAL | 0 refills | Status: DC

## 2022-08-03 MED ORDER — ATORVASTATIN CALCIUM 80 MG PO TABS: 80.0000 mg | ORAL_TABLET | Freq: Every day | ORAL | 0 refills | Status: DC

## 2022-08-03 MED ORDER — CELECOXIB 200 MG PO CAPS: 200.0000 mg | ORAL_CAPSULE | Freq: Two times a day (BID) | ORAL | 0 refills | Status: DC

## 2022-08-03 MED ORDER — LEVETIRACETAM 500 MG PO TABS: 500.0000 mg | ORAL_TABLET | Freq: Two times a day (BID) | ORAL | 0 refills | Status: DC

## 2022-08-03 MED ORDER — METHOCARBAMOL 500 MG PO TABS: 500.0000 mg | ORAL_TABLET | Freq: Four times a day (QID) | ORAL | 0 refills | Status: DC

## 2022-08-03 MED ORDER — NITROGLYCERIN 0.4 MG SL SUBL: 0.4000 mg | SUBLINGUAL_TABLET | SUBLINGUAL | 0 refills | Status: DC | PRN

## 2022-08-03 MED ORDER — SERTRALINE HCL 50 MG PO TABS: 50.0000 mg | ORAL_TABLET | Freq: Every day | ORAL | 0 refills | Status: DC

## 2022-08-03 MED ORDER — RISPERIDONE 0.5 MG PO TABS: 0.5000 mg | ORAL_TABLET | Freq: Two times a day (BID) | ORAL | 0 refills | Status: DC

## 2022-08-03 MED ORDER — HYDROCODONE-ACETAMINOPHEN 10-325 MG PO TABS: 1.0000 | ORAL_TABLET | Freq: Every day | ORAL | 0 refills | Status: DC

## 2022-08-03 MED ORDER — ALPRAZOLAM 0.5 MG PO TABS: 0.5000 mg | ORAL_TABLET | Freq: Every evening | ORAL | 0 refills | Status: DC | PRN

## 2022-08-03 MED ORDER — MEMANTINE HCL 5 MG PO TABS: 5.0000 mg | ORAL_TABLET | Freq: Two times a day (BID) | ORAL | 0 refills | Status: DC

## 2022-08-03 MED ORDER — PANTOPRAZOLE SODIUM 40 MG PO TBEC: 40.0000 mg | DELAYED_RELEASE_TABLET | Freq: Every day | ORAL | 0 refills | Status: DC

## 2022-08-03 MED ORDER — CARVEDILOL 12.5 MG PO TABS: 12.5000 mg | ORAL_TABLET | Freq: Two times a day (BID) | ORAL | 0 refills | Status: DC

## 2022-08-03 MED ORDER — ALBUTEROL SULFATE HFA 108 (90 BASE) MCG/ACT IN AERS: 2.0000 | INHALATION_SPRAY | Freq: Four times a day (QID) | RESPIRATORY_TRACT | 0 refills | Status: DC | PRN

## 2022-08-03 NOTE — Progress Notes (Unsigned)
Location:  [facility] Room Number: 159 Place of Service:  SNF (31)   CODE STATUS: dnr   Allergies  Allergen Reactions   Lisinopril Swelling   Egg-Derived Products Itching    Patient reports that she can eat eggs but does not tolerate egg derived products   Morphine And Codeine     Headache   Penicillins Itching         Chief Complaint  Patient presents with   Discharge Note    HPI:  She is being discharged to home with home health for pt/ot. She will not need any dme. She will need her medications written and will need to follow up with her medical provider. She had been hospitalized for acute respiratory failure; pneumonia. She was admitted to this facility for short term rehab. Therapy: ambulate 40 feet with rolling walker and contact guard; upper body supervision; lower body min assist; brp contact guard.   Past Medical History:  Diagnosis Date   Anxiety    Arthritis    COPD (chronic obstructive pulmonary disease) (HCC)    Coronary artery disease    a. STEMI with cardiac arrest (polymorphic VT) s/p DES to RCA.   GERD (gastroesophageal reflux disease)    Headache(784.0)    Hearing loss, central    Left ear   Hematoma    Hypertension    Memory changes    Mild aortic stenosis    Mild carotid artery disease (HCC)    a. 1-39% by duplex 02/2018.   Seizures (HCC) 10/2012   No seizures, speech issues; after a fall, hitting head on left side   UTI (urinary tract infection)    June 2022    Past Surgical History:  Procedure Laterality Date   ABDOMINAL HYSTERECTOMY     APPENDECTOMY     CARDIAC CATHETERIZATION     15 yrs ago   CORONARY/GRAFT ACUTE MI REVASCULARIZATION N/A 01/10/2018   Procedure: Coronary/Graft Acute MI Revascularization;  Surgeon: Runell Gess, MD;  Location: [facility] INVASIVE CV LAB;  Service: Cardiovascular;  Laterality: N/A;   CRANIOTOMY Left 10/30/2012   Procedure: CRANIOTOMY HEMATOMA EVACUATION SUBDURAL;  Surgeon: Maeola Harman, MD;  Location: [facility] NEURO ORS;  Service: Neurosurgery;  Laterality: Left;  Left Craniotomy for evacuation of subdural hematoma   JOINT REPLACEMENT Bilateral    knees   LEFT HEART CATH AND CORONARY ANGIOGRAPHY N/A 01/10/2018   Procedure: LEFT HEART CATH AND CORONARY ANGIOGRAPHY;  Surgeon: Runell Gess, MD;  Location: [facility] INVASIVE CV LAB;  Service: Cardiovascular;  Laterality: N/A;    Social History   Socioeconomic History   Marital status: Married    Spouse name: Not on file   Number of children: Not on file   Years of education: Not on file   Highest education level: Not on file  Occupational History   Not on file  Tobacco Use   Smoking status: Never   Smokeless tobacco: Never  Vaping Use   Vaping status: Never Used  Substance and Sexual Activity   Alcohol use: No   Drug use: No   Sexual activity: Not on file  Other Topics Concern   Not on file  Social History Narrative   Not on file   Social Determinants of Health   Financial Resource Strain: Not on file  Food Insecurity: No Food Insecurity (07/14/2022)   Hunger Vital Sign    Worried About Running Out of Food in the Last Year: Never true  Ran Out of Food in the Last Year: Never true  Transportation Needs: Patient Declined (07/14/2022)   PRAPARE - Administrator, Civil Service (Medical): Patient declined    Lack of Transportation (Non-Medical): Patient declined  Physical Activity: Not on file  Stress: Not on file  Social Connections: Not on file  Intimate Partner Violence: Patient Declined (07/14/2022)   Humiliation, Afraid, Rape, and Kick questionnaire    Fear of Current or Ex-Partner: Patient declined    Emotionally Abused: Patient declined    Physically Abused: Patient declined    Sexually Abused: Patient declined   Family History  Problem Relation Age of Onset   Dementia Mother    Cancer Father    Dementia Maternal Aunt    Ataxia Neg Hx    Chorea Neg Hx    Mental retardation Neg Hx     Migraines Neg Hx    Multiple sclerosis Neg Hx    Neurofibromatosis Neg Hx    Neuropathy Neg Hx    Parkinsonism Neg Hx    Seizures Neg Hx    Stroke Neg Hx       VITAL SIGNS BP 119/67   Pulse 70   Temp (!) 97.5 F (36.4 C)   Resp 16   Ht 5' (1.524 m)   Wt 140 lb 9.6 oz (63.8 kg)   SpO2 98%   BMI 27.46 kg/m   Outpatient Encounter Medications as of 08/03/2022  Medication Sig   acetaminophen (TYLENOL) 500 MG tablet Take 500 mg by mouth every 4 (four) hours.   albuterol (VENTOLIN HFA) 108 (90 Base) MCG/ACT inhaler Inhale 2 puffs into the lungs every 6 (six) hours as needed for wheezing or shortness of breath.   ALPRAZolam (XANAX) 0.5 MG tablet Take 1 tablet (0.5 mg total) by mouth at bedtime as needed for anxiety.   amLODipine (NORVASC) 10 MG tablet Take 1 tablet (10 mg total) by mouth daily.   aspirin EC 81 MG tablet Take 1 tablet (81 mg total) by mouth daily with breakfast.   atorvastatin (LIPITOR) 80 MG tablet Take 1 tablet (80 mg total) by mouth daily at 6 PM.   carvedilol (COREG) 12.5 MG tablet Take 1 tablet (12.5 mg total) by mouth 2 (two) times daily.   celecoxib (CELEBREX) 200 MG capsule Take 1 capsule (200 mg total) by mouth 2 (two) times daily.   Cholecalciferol 25 MCG (1000 UT) TBDP Take 1,000 Units by mouth daily.   famotidine (PEPCID) 20 MG tablet Take 20 mg by mouth daily.   gabapentin (NEURONTIN) 100 MG capsule Take 1 capsule (100 mg total) by mouth at bedtime.   guaiFENesin (MUCINEX) 600 MG 12 hr tablet Take 1 tablet (600 mg total) by mouth 2 (two) times daily.   HYDROcodone-acetaminophen (NORCO) 10-325 MG tablet Take 1 tablet by mouth at bedtime.   latanoprost (XALATAN) 0.005 % ophthalmic solution INSTILL ONE DROP IN BOTH EYES NIGHTLY   levETIRAcetam (KEPPRA) 500 MG tablet Take 1 tablet (500 mg total) by mouth 2 (two) times daily.   memantine (NAMENDA) 5 MG tablet Take 1 tablet (5 mg total) by mouth 2 (two) times daily.   methocarbamol (ROBAXIN) 500 MG tablet Take 1  tablet (500 mg total) by mouth 4 (four) times daily.   Misc Natural Products (COLON CLEANSE PO) Take 1 capsule by mouth daily as needed (Constipation).   nitroGLYCERIN (NITROSTAT) 0.4 MG SL tablet Place 1 tablet (0.4 mg total) under the tongue every 5 (five) minutes x 3 doses  as needed for chest pain.   Nutritional Supplements (ENSURE ENLIVE PO) Take 237 mLs by mouth daily.   NYAMYC powder Apply 1 Application topically 2 (two) times daily as needed (Irritation).   pantoprazole (PROTONIX) 40 MG tablet Take 1 tablet (40 mg total) by mouth daily.   Propylene Glycol (SYSTANE BALANCE OP) Place 2 drops into both eyes daily as needed (dry eyes).   risperiDONE (RISPERDAL) 0.5 MG tablet Take 1 tablet (0.5 mg total) by mouth 2 (two) times daily.   sertraline (ZOLOFT) 50 MG tablet Take 1 tablet (50 mg total) by mouth daily.   [DISCONTINUED] lisinopril (ZESTRIL) 20 MG tablet Take 1 tablet (20 mg total) by mouth 2 (two) times daily.   No facility-administered encounter medications on file as of 08/03/2022.     SIGNIFICANT DIAGNOSTIC EXAMS  TODAY  07-14-22: chest x-ray:  1. Cardiomegaly without failure. 2. Superimposed interstitial and airspace opacities in the upper lobes bilaterally. This may represent edema or infection. 3. Large hiatal hernia.  07-14-22: ct of head:  1. No acute intracranial process. 2. Mild diffuse atrophy and chronic small vessel ischemic changes. 3. Old lacunar infarcts in the bilateral basal ganglia.  LABS REVIEWED:   07-14-22: wbc 14.0; hgb 11.3; hct 34.9; mcv 89.5 plt 335; glucose 126; bun 23; creat 0.64; k+ 4.5; na++ 128; ca 9.3; gfr >60; BNP 154 07-17-22: wbc 13.8; hgb 12.1; hct 36.9; mcv 89.6 plt 303; glucose 106; bun 13; creat 0.53; k+ 3.0; na++ 133;ca 8.8 gfr >60    Review of Systems  Constitutional:  Negative for malaise/fatigue.  Respiratory:  Negative for cough and shortness of breath.   Cardiovascular:  Negative for chest pain, palpitations and leg swelling.   Gastrointestinal:  Negative for abdominal pain, constipation and heartburn.  Musculoskeletal:  Negative for back pain, joint pain and myalgias.  Skin: Negative.   Neurological:  Negative for dizziness.  Psychiatric/Behavioral:  The patient is not nervous/anxious.    Physical Exam Constitutional:      General: She is not in acute distress.    Appearance: She is well-developed. She is not diaphoretic.  Neck:     Thyroid: No thyromegaly.  Cardiovascular:     Rate and Rhythm: Normal rate and regular rhythm.     Pulses: Normal pulses.     Heart sounds: Normal heart sounds.  Pulmonary:     Effort: Pulmonary effort is normal. No respiratory distress.     Breath sounds: Normal breath sounds.  Abdominal:     General: Bowel sounds are normal. There is no distension.     Palpations: Abdomen is soft.     Tenderness: There is no abdominal tenderness.  Musculoskeletal:        General: Normal range of motion.     Cervical back: Neck supple.     Right lower leg: No edema.     Left lower leg: No edema.  Lymphadenopathy:     Cervical: No cervical adenopathy.  Skin:    General: Skin is warm and dry.  Neurological:     Mental Status: She is alert and oriented to person, place, and time.  Psychiatric:        Mood and Affect: Mood normal.     ASSESSMENT/ PLAN:   Patient is being discharged with the following home health services:  pt/ot to evaluate and treat as indicated for gait balance strength adl training.   Patient is being discharged with the following durable medical equipment:  none needed   Patient has  been advised to f/u with their PCP in 1-2 weeks to for a transitions of care visit.  Social services at their facility was responsible for arranging this appointment.  Pt was provided with adequate prescriptions of noncontrolled medications to reach the scheduled appointment .  For controlled substances, a limited supply was provided as appropriate for the individual patient.  If the  pt normally receives these medications from a pain clinic or has a contract with another physician, these medications should be received from that clinic or physician only).    A 30 day supply of her prescription medications have been sent to [facility]  Time spent with patient: 35 minutes: medications; home health; dme    Synthia Innocent NP [facility]   call [phone]

## 2022-08-08 ENCOUNTER — Other Ambulatory Visit: Payer: Self-pay | Admitting: *Deleted

## 2022-08-08 DIAGNOSIS — I10 Essential (primary) hypertension: Secondary | ICD-10-CM

## 2022-08-08 NOTE — Patient Outreach (Addendum)
Per [facility] Mrs. Raygoza discharged from [facility] skilled nursing facility on 08/06/22. Screening for potential care coordination services as benefit of health plan and Primary Care Provider.  Previously spoke with Mrs. Toothaker's daughter, [name] [phone] on 07/28/22. [name] was agreeable to care coordination follow up.  Mrs. Dirksen lives with [name]. [name] endorsed she should be primary contact at [phone].  Message sent to [facility] Alar social worker to inquire about home health agency arrangements.   Referral made to care coordination team for RN care coordination.   Addendum: Update received from Mel[place] Alar social worker, indicating [name] will provide home services.   Raiford Noble, MSN, RN,BSN [facility] Care Coordinator [phone] (Direct dial)

## 2022-08-09 ENCOUNTER — Ambulatory Visit: Payer: Medicare Other | Admitting: Physician Assistant

## 2022-08-09 ENCOUNTER — Telehealth: Payer: Self-pay | Admitting: *Deleted

## 2022-08-09 DIAGNOSIS — H40053 Ocular hypertension, bilateral: Secondary | ICD-10-CM | POA: Diagnosis not present

## 2022-08-09 DIAGNOSIS — F02A2 Dementia in other diseases classified elsewhere, mild, with psychotic disturbance: Secondary | ICD-10-CM | POA: Diagnosis not present

## 2022-08-09 DIAGNOSIS — F02A3 Dementia in other diseases classified elsewhere, mild, with mood disturbance: Secondary | ICD-10-CM | POA: Diagnosis not present

## 2022-08-09 DIAGNOSIS — J9601 Acute respiratory failure with hypoxia: Secondary | ICD-10-CM | POA: Diagnosis not present

## 2022-08-09 DIAGNOSIS — D509 Iron deficiency anemia, unspecified: Secondary | ICD-10-CM | POA: Diagnosis not present

## 2022-08-09 DIAGNOSIS — I350 Nonrheumatic aortic (valve) stenosis: Secondary | ICD-10-CM | POA: Diagnosis not present

## 2022-08-09 DIAGNOSIS — G8929 Other chronic pain: Secondary | ICD-10-CM | POA: Diagnosis not present

## 2022-08-09 DIAGNOSIS — M5481 Occipital neuralgia: Secondary | ICD-10-CM | POA: Diagnosis not present

## 2022-08-09 DIAGNOSIS — E441 Mild protein-calorie malnutrition: Secondary | ICD-10-CM | POA: Diagnosis not present

## 2022-08-09 DIAGNOSIS — M542 Cervicalgia: Secondary | ICD-10-CM | POA: Diagnosis not present

## 2022-08-09 DIAGNOSIS — H3530 Unspecified macular degeneration: Secondary | ICD-10-CM | POA: Diagnosis not present

## 2022-08-09 DIAGNOSIS — I25119 Atherosclerotic heart disease of native coronary artery with unspecified angina pectoris: Secondary | ICD-10-CM | POA: Diagnosis not present

## 2022-08-09 DIAGNOSIS — G9341 Metabolic encephalopathy: Secondary | ICD-10-CM | POA: Diagnosis not present

## 2022-08-09 DIAGNOSIS — J4489 Other specified chronic obstructive pulmonary disease: Secondary | ICD-10-CM | POA: Diagnosis not present

## 2022-08-09 DIAGNOSIS — M1990 Unspecified osteoarthritis, unspecified site: Secondary | ICD-10-CM | POA: Diagnosis not present

## 2022-08-09 DIAGNOSIS — F02A4 Dementia in other diseases classified elsewhere, mild, with anxiety: Secondary | ICD-10-CM | POA: Diagnosis not present

## 2022-08-09 DIAGNOSIS — I6529 Occlusion and stenosis of unspecified carotid artery: Secondary | ICD-10-CM | POA: Diagnosis not present

## 2022-08-09 DIAGNOSIS — I700 Atherosclerosis of aorta: Secondary | ICD-10-CM | POA: Diagnosis not present

## 2022-08-09 DIAGNOSIS — J440 Chronic obstructive pulmonary disease with acute lower respiratory infection: Secondary | ICD-10-CM | POA: Diagnosis not present

## 2022-08-09 DIAGNOSIS — I252 Old myocardial infarction: Secondary | ICD-10-CM | POA: Diagnosis not present

## 2022-08-09 DIAGNOSIS — K219 Gastro-esophageal reflux disease without esophagitis: Secondary | ICD-10-CM | POA: Diagnosis not present

## 2022-08-09 DIAGNOSIS — H9192 Unspecified hearing loss, left ear: Secondary | ICD-10-CM | POA: Diagnosis not present

## 2022-08-09 NOTE — Progress Notes (Signed)
  Care Coordination   Note   08/09/2022 Name: Beverly [name] MRN: [id number] DOB: 04-22-1932  Beverly [name] is a 89 y.o. year old female who sees Margo Aye, Kathleene Hazel, MD for primary care. I reached out to Volney American by phone today to offer care coordination services.  [name] was given information about Care Coordination services today including:   The Care Coordination services include support from the care team which includes your Nurse Coordinator, Clinical Social Worker, or Pharmacist.  The Care Coordination team is here to help remove barriers to the health concerns and goals most important to you. Care Coordination services are voluntary, and the patient may decline or stop services at any time by request to their care team member.   Care Coordination Consent Status: Patient agreed to services and verbal consent obtained.   Follow up plan:  Telephone appointment with care coordination team member scheduled for:  08/12/22  Encounter Outcome:  patient scheduled   [facility] Coordination Care Guide  Direct Dial: [phone]

## 2022-08-10 DIAGNOSIS — I252 Old myocardial infarction: Secondary | ICD-10-CM | POA: Diagnosis not present

## 2022-08-10 DIAGNOSIS — E441 Mild protein-calorie malnutrition: Secondary | ICD-10-CM | POA: Diagnosis not present

## 2022-08-10 DIAGNOSIS — I25119 Atherosclerotic heart disease of native coronary artery with unspecified angina pectoris: Secondary | ICD-10-CM | POA: Diagnosis not present

## 2022-08-10 DIAGNOSIS — G8929 Other chronic pain: Secondary | ICD-10-CM | POA: Diagnosis not present

## 2022-08-10 DIAGNOSIS — J9601 Acute respiratory failure with hypoxia: Secondary | ICD-10-CM | POA: Diagnosis not present

## 2022-08-10 DIAGNOSIS — G9341 Metabolic encephalopathy: Secondary | ICD-10-CM | POA: Diagnosis not present

## 2022-08-10 DIAGNOSIS — J4489 Other specified chronic obstructive pulmonary disease: Secondary | ICD-10-CM | POA: Diagnosis not present

## 2022-08-10 DIAGNOSIS — M5481 Occipital neuralgia: Secondary | ICD-10-CM | POA: Diagnosis not present

## 2022-08-10 DIAGNOSIS — M1990 Unspecified osteoarthritis, unspecified site: Secondary | ICD-10-CM | POA: Diagnosis not present

## 2022-08-10 DIAGNOSIS — F02A4 Dementia in other diseases classified elsewhere, mild, with anxiety: Secondary | ICD-10-CM | POA: Diagnosis not present

## 2022-08-10 DIAGNOSIS — I700 Atherosclerosis of aorta: Secondary | ICD-10-CM | POA: Diagnosis not present

## 2022-08-10 DIAGNOSIS — M542 Cervicalgia: Secondary | ICD-10-CM | POA: Diagnosis not present

## 2022-08-10 DIAGNOSIS — F02A3 Dementia in other diseases classified elsewhere, mild, with mood disturbance: Secondary | ICD-10-CM | POA: Diagnosis not present

## 2022-08-10 DIAGNOSIS — F02A2 Dementia in other diseases classified elsewhere, mild, with psychotic disturbance: Secondary | ICD-10-CM | POA: Diagnosis not present

## 2022-08-10 DIAGNOSIS — J440 Chronic obstructive pulmonary disease with acute lower respiratory infection: Secondary | ICD-10-CM | POA: Diagnosis not present

## 2022-08-10 DIAGNOSIS — I350 Nonrheumatic aortic (valve) stenosis: Secondary | ICD-10-CM | POA: Diagnosis not present

## 2022-08-10 DIAGNOSIS — D509 Iron deficiency anemia, unspecified: Secondary | ICD-10-CM | POA: Diagnosis not present

## 2022-08-10 DIAGNOSIS — H40053 Ocular hypertension, bilateral: Secondary | ICD-10-CM | POA: Diagnosis not present

## 2022-08-10 DIAGNOSIS — I6529 Occlusion and stenosis of unspecified carotid artery: Secondary | ICD-10-CM | POA: Diagnosis not present

## 2022-08-10 DIAGNOSIS — H3530 Unspecified macular degeneration: Secondary | ICD-10-CM | POA: Diagnosis not present

## 2022-08-10 DIAGNOSIS — K219 Gastro-esophageal reflux disease without esophagitis: Secondary | ICD-10-CM | POA: Diagnosis not present

## 2022-08-10 DIAGNOSIS — H9192 Unspecified hearing loss, left ear: Secondary | ICD-10-CM | POA: Diagnosis not present

## 2022-08-12 ENCOUNTER — Ambulatory Visit: Payer: Self-pay | Admitting: *Deleted

## 2022-08-12 NOTE — Patient Outreach (Addendum)
Care Coordination   Initial Visit Note   08/12/2022 Name: Beverly [name] MRN: [id number] DOB: 1932-12-27  Beverly [name] is a 89 y.o. year old female who sees Beverly [name], Beverly Hazel, MD for primary care. I spoke with  Beverly [name] by phone today.  What matters to the patients health and wellness today?  Initial assessment and introduction with Beverly [name], daughter Beverly [name] confirms [name] is managing well at home since her recent admission and discharge from the hospital and skilled nursing facility She has Home therapy, equipment, a sitter and palliative care services monthly  Pneumonia -resolved, improved    No social determinant of health or medical concerns at this time & further outreach/availability of [facility] RN CM/program is welcomed   Goals Addressed             This Visit's Progress    [facility] care coordination services   On track    Interventions Today    Flowsheet Row Most Recent Value  Chronic Disease   Chronic disease during today's visit Chronic Obstructive Pulmonary Disease (COPD), Other, Hypertension (HTN)  [mild cognitive, macular degenerative vision]  General Interventions   General Interventions Discussed/Reviewed General Interventions Discussed, [facility], Doctor Visits, Horticulturist, commercial (DME)  Doctor Visits Discussed/Reviewed Doctor Visits Discussed, PCP  Durable Medical Equipment (DME) Dan Humphreys  PCP/Specialist Visits Compliance with follow-up visit  Education Interventions   Education Provided --  [discussed keeping a note book for entries of care, medicine to assist as has mild cognitive concerns]  Provided Verbal Education On [employer]  Pharmacy Interventions   Pharmacy Dicussed/Reviewed Pharmacy Topics Discussed, Affording Medications  Advanced Directive Interventions   Advanced Directives Discussed/Reviewed Advanced Directives Discussed, Advanced Care Planning  [noted patient has documents in cone chart already]              SDOH  assessments and interventions completed:  Yes  SDOH Interventions Today    Flowsheet Row Most Recent Value  SDOH Interventions   Food Insecurity Interventions Intervention Not Indicated  Housing Interventions Intervention Not Indicated  Transportation Interventions Intervention Not Indicated  Utilities Interventions Intervention Not Indicated  Financial Strain Interventions Intervention Not Indicated  Stress Interventions Intervention Not Indicated  Social Connections Interventions Intervention Not Indicated  Health Literacy Interventions Intervention Not Indicated       Patient Active Problem List   Diagnosis Date Noted   Chronic pain 07/19/2022   Mild protein malnutrition (HCC) 07/19/2022   PNA (pneumonia) 07/14/2022   Acute hypoxic respiratory failure (HCC) 07/14/2022   Acute metabolic encephalopathy 07/14/2022   Cardiac murmur 07/14/2022   AKI (acute kidney injury) (HCC) 11/26/2021   Arthritis 11/24/2021   Increased intraocular pressure, bilateral 11/24/2021   GERD without esophagitis 11/24/2021   MCI (mild cognitive impairment) 11/24/2021   Major depression, recurrent, chronic (HCC) 11/24/2021   Aortic atherosclerosis (HCC) 11/24/2021   Coronary artery disease with angina pectoris (HCC) 11/24/2021   Right epiretinal membrane 04/13/2021   Lobar pneumonia (HCC) 05/09/2020   Goals of care, counseling/discussion 05/09/2020   Pleural effusion, right    Pneumonia 05/02/2020   CAP (community acquired pneumonia) 05/01/2020   Anemia 05/01/2020   Heme positive stool 05/01/2020   Advanced nonexudative age-related macular degeneration of right eye with subfoveal involvement 11/11/2019   Exudative age-related macular degeneration of left eye with active choroidal neovascularization (HCC) 05/13/2019   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 neovascularization (HCC) 05/13/2019   Primary open angle glaucoma of both eyes, mild stage  05/13/2019    Age-related nuclear cataract of left eye 05/13/2019   Hypokalemia 01/12/2018   Hypomagnesemia 01/12/2018   COPD (chronic obstructive pulmonary disease) (HCC) 01/12/2018   Acute ST elevation myocardial infarction (STEMI) of inferior wall (HCC) 01/10/2018   Cardiac arrest (HCC)    Dyslipidemia (high LDL; low HDL)    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 Visit Completed   [place] L. Noelle Penner, RN, BSN, CCM [facility] Care Management Community Coordinator Office number [phone]

## 2022-08-12 NOTE — Patient Instructions (Addendum)
Visit Information  Thank you for taking time to visit with me today. Please don't hesitate to contact me if I can be of assistance to you.   Following are the goals we discussed today:   Goals Addressed             This Visit's Progress    [facility] care coordination services   On track    Interventions Today    Flowsheet Row Most Recent Value  Chronic Disease   Chronic disease during today's visit Chronic Obstructive Pulmonary Disease (COPD), Other, Hypertension (HTN)  [mild cognitive, macular degenerative vision]  General Interventions   General Interventions Discussed/Reviewed General Interventions Discussed, [facility], Doctor Visits, Horticulturist, commercial (DME)  Doctor Visits Discussed/Reviewed Doctor Visits Discussed, PCP  Durable Medical Equipment (DME) [facility]  PCP/Specialist Visits Compliance with follow-up visit  Education Interventions   Education Provided --  [discussed keeping a note book for entries of care, medicine to assist as has mild cognitive concerns]  Provided Verbal Education On [employer]  Pharmacy Interventions   Pharmacy Dicussed/Reviewed Pharmacy Topics Discussed, Affording Medications  Advanced Directive Interventions   Advanced Directives Discussed/Reviewed Advanced Directives Discussed, Advanced Care Planning  [noted patient has documents in cone chart already]              Our next appointment is by telephone on 09/12/22 at 1000  Please call the care guide team at [phone] if you need to cancel or reschedule your appointment.   If you are experiencing a Mental Health or Behavioral Health Crisis or need someone to talk to, please call the Suicide and Crisis Lifeline: 988 call the [place] National Suicide Prevention Lifeline: 7812092730 or TTY: (959)023-6851 TTY (973)542-6076) to talk to a trained counselor call 1-800-273-TALK (toll free, 24 hour hotline) call the [facility]: 979-198-6383 call 911    Patient verbalizes understanding of instructions and care plan provided today and agrees to view in MyChart. Active MyChart status and patient understanding of how to access instructions and care plan via MyChart confirmed with patient.     The patient has been provided with contact information for the care management team and has been advised to call with any health related questions or concerns.   Rigby Swamy L. Noelle Penner, RN, BSN, CCM [facility] Care Management Community Coordinator Office number [phone]

## 2022-08-15 DIAGNOSIS — F03A2 Unspecified dementia, mild, with psychotic disturbance: Secondary | ICD-10-CM | POA: Diagnosis not present

## 2022-08-15 DIAGNOSIS — J449 Chronic obstructive pulmonary disease, unspecified: Secondary | ICD-10-CM | POA: Diagnosis not present

## 2022-08-15 DIAGNOSIS — Z515 Encounter for palliative care: Secondary | ICD-10-CM | POA: Diagnosis not present

## 2022-08-16 DIAGNOSIS — G8929 Other chronic pain: Secondary | ICD-10-CM | POA: Diagnosis not present

## 2022-08-16 DIAGNOSIS — J9601 Acute respiratory failure with hypoxia: Secondary | ICD-10-CM | POA: Diagnosis not present

## 2022-08-16 DIAGNOSIS — K219 Gastro-esophageal reflux disease without esophagitis: Secondary | ICD-10-CM | POA: Diagnosis not present

## 2022-08-16 DIAGNOSIS — I252 Old myocardial infarction: Secondary | ICD-10-CM | POA: Diagnosis not present

## 2022-08-16 DIAGNOSIS — H3530 Unspecified macular degeneration: Secondary | ICD-10-CM | POA: Diagnosis not present

## 2022-08-16 DIAGNOSIS — I350 Nonrheumatic aortic (valve) stenosis: Secondary | ICD-10-CM | POA: Diagnosis not present

## 2022-08-16 DIAGNOSIS — H9192 Unspecified hearing loss, left ear: Secondary | ICD-10-CM | POA: Diagnosis not present

## 2022-08-16 DIAGNOSIS — M5481 Occipital neuralgia: Secondary | ICD-10-CM | POA: Diagnosis not present

## 2022-08-16 DIAGNOSIS — D509 Iron deficiency anemia, unspecified: Secondary | ICD-10-CM | POA: Diagnosis not present

## 2022-08-16 DIAGNOSIS — J440 Chronic obstructive pulmonary disease with acute lower respiratory infection: Secondary | ICD-10-CM | POA: Diagnosis not present

## 2022-08-16 DIAGNOSIS — G9341 Metabolic encephalopathy: Secondary | ICD-10-CM | POA: Diagnosis not present

## 2022-08-16 DIAGNOSIS — H40053 Ocular hypertension, bilateral: Secondary | ICD-10-CM | POA: Diagnosis not present

## 2022-08-16 DIAGNOSIS — F02A2 Dementia in other diseases classified elsewhere, mild, with psychotic disturbance: Secondary | ICD-10-CM | POA: Diagnosis not present

## 2022-08-16 DIAGNOSIS — I6529 Occlusion and stenosis of unspecified carotid artery: Secondary | ICD-10-CM | POA: Diagnosis not present

## 2022-08-16 DIAGNOSIS — F02A4 Dementia in other diseases classified elsewhere, mild, with anxiety: Secondary | ICD-10-CM | POA: Diagnosis not present

## 2022-08-16 DIAGNOSIS — F02A3 Dementia in other diseases classified elsewhere, mild, with mood disturbance: Secondary | ICD-10-CM | POA: Diagnosis not present

## 2022-08-16 DIAGNOSIS — E441 Mild protein-calorie malnutrition: Secondary | ICD-10-CM | POA: Diagnosis not present

## 2022-08-16 DIAGNOSIS — I700 Atherosclerosis of aorta: Secondary | ICD-10-CM | POA: Diagnosis not present

## 2022-08-16 DIAGNOSIS — M1990 Unspecified osteoarthritis, unspecified site: Secondary | ICD-10-CM | POA: Diagnosis not present

## 2022-08-16 DIAGNOSIS — I25119 Atherosclerotic heart disease of native coronary artery with unspecified angina pectoris: Secondary | ICD-10-CM | POA: Diagnosis not present

## 2022-08-16 DIAGNOSIS — J4489 Other specified chronic obstructive pulmonary disease: Secondary | ICD-10-CM | POA: Diagnosis not present

## 2022-08-16 DIAGNOSIS — M542 Cervicalgia: Secondary | ICD-10-CM | POA: Diagnosis not present

## 2022-08-18 DIAGNOSIS — I350 Nonrheumatic aortic (valve) stenosis: Secondary | ICD-10-CM | POA: Diagnosis not present

## 2022-08-18 DIAGNOSIS — F02A3 Dementia in other diseases classified elsewhere, mild, with mood disturbance: Secondary | ICD-10-CM | POA: Diagnosis not present

## 2022-08-18 DIAGNOSIS — G8929 Other chronic pain: Secondary | ICD-10-CM | POA: Diagnosis not present

## 2022-08-18 DIAGNOSIS — H3530 Unspecified macular degeneration: Secondary | ICD-10-CM | POA: Diagnosis not present

## 2022-08-18 DIAGNOSIS — J4489 Other specified chronic obstructive pulmonary disease: Secondary | ICD-10-CM | POA: Diagnosis not present

## 2022-08-18 DIAGNOSIS — G9341 Metabolic encephalopathy: Secondary | ICD-10-CM | POA: Diagnosis not present

## 2022-08-18 DIAGNOSIS — I6529 Occlusion and stenosis of unspecified carotid artery: Secondary | ICD-10-CM | POA: Diagnosis not present

## 2022-08-18 DIAGNOSIS — M542 Cervicalgia: Secondary | ICD-10-CM | POA: Diagnosis not present

## 2022-08-18 DIAGNOSIS — I252 Old myocardial infarction: Secondary | ICD-10-CM | POA: Diagnosis not present

## 2022-08-18 DIAGNOSIS — M1990 Unspecified osteoarthritis, unspecified site: Secondary | ICD-10-CM | POA: Diagnosis not present

## 2022-08-18 DIAGNOSIS — K219 Gastro-esophageal reflux disease without esophagitis: Secondary | ICD-10-CM | POA: Diagnosis not present

## 2022-08-18 DIAGNOSIS — F02A2 Dementia in other diseases classified elsewhere, mild, with psychotic disturbance: Secondary | ICD-10-CM | POA: Diagnosis not present

## 2022-08-18 DIAGNOSIS — I700 Atherosclerosis of aorta: Secondary | ICD-10-CM | POA: Diagnosis not present

## 2022-08-18 DIAGNOSIS — H9192 Unspecified hearing loss, left ear: Secondary | ICD-10-CM | POA: Diagnosis not present

## 2022-08-18 DIAGNOSIS — J9601 Acute respiratory failure with hypoxia: Secondary | ICD-10-CM | POA: Diagnosis not present

## 2022-08-18 DIAGNOSIS — E441 Mild protein-calorie malnutrition: Secondary | ICD-10-CM | POA: Diagnosis not present

## 2022-08-18 DIAGNOSIS — I25119 Atherosclerotic heart disease of native coronary artery with unspecified angina pectoris: Secondary | ICD-10-CM | POA: Diagnosis not present

## 2022-08-18 DIAGNOSIS — F02A4 Dementia in other diseases classified elsewhere, mild, with anxiety: Secondary | ICD-10-CM | POA: Diagnosis not present

## 2022-08-18 DIAGNOSIS — H40053 Ocular hypertension, bilateral: Secondary | ICD-10-CM | POA: Diagnosis not present

## 2022-08-18 DIAGNOSIS — M5481 Occipital neuralgia: Secondary | ICD-10-CM | POA: Diagnosis not present

## 2022-08-18 DIAGNOSIS — D509 Iron deficiency anemia, unspecified: Secondary | ICD-10-CM | POA: Diagnosis not present

## 2022-08-18 DIAGNOSIS — J440 Chronic obstructive pulmonary disease with acute lower respiratory infection: Secondary | ICD-10-CM | POA: Diagnosis not present

## 2022-08-22 DIAGNOSIS — M5481 Occipital neuralgia: Secondary | ICD-10-CM | POA: Diagnosis not present

## 2022-08-22 DIAGNOSIS — I700 Atherosclerosis of aorta: Secondary | ICD-10-CM | POA: Diagnosis not present

## 2022-08-22 DIAGNOSIS — I25119 Atherosclerotic heart disease of native coronary artery with unspecified angina pectoris: Secondary | ICD-10-CM | POA: Diagnosis not present

## 2022-08-22 DIAGNOSIS — I252 Old myocardial infarction: Secondary | ICD-10-CM | POA: Diagnosis not present

## 2022-08-22 DIAGNOSIS — H9192 Unspecified hearing loss, left ear: Secondary | ICD-10-CM | POA: Diagnosis not present

## 2022-08-22 DIAGNOSIS — I6529 Occlusion and stenosis of unspecified carotid artery: Secondary | ICD-10-CM | POA: Diagnosis not present

## 2022-08-22 DIAGNOSIS — F02A3 Dementia in other diseases classified elsewhere, mild, with mood disturbance: Secondary | ICD-10-CM | POA: Diagnosis not present

## 2022-08-22 DIAGNOSIS — F02A4 Dementia in other diseases classified elsewhere, mild, with anxiety: Secondary | ICD-10-CM | POA: Diagnosis not present

## 2022-08-22 DIAGNOSIS — J4489 Other specified chronic obstructive pulmonary disease: Secondary | ICD-10-CM | POA: Diagnosis not present

## 2022-08-22 DIAGNOSIS — D509 Iron deficiency anemia, unspecified: Secondary | ICD-10-CM | POA: Diagnosis not present

## 2022-08-22 DIAGNOSIS — H40053 Ocular hypertension, bilateral: Secondary | ICD-10-CM | POA: Diagnosis not present

## 2022-08-22 DIAGNOSIS — H3530 Unspecified macular degeneration: Secondary | ICD-10-CM | POA: Diagnosis not present

## 2022-08-22 DIAGNOSIS — M1990 Unspecified osteoarthritis, unspecified site: Secondary | ICD-10-CM | POA: Diagnosis not present

## 2022-08-22 DIAGNOSIS — I350 Nonrheumatic aortic (valve) stenosis: Secondary | ICD-10-CM | POA: Diagnosis not present

## 2022-08-22 DIAGNOSIS — G9341 Metabolic encephalopathy: Secondary | ICD-10-CM | POA: Diagnosis not present

## 2022-08-22 DIAGNOSIS — K219 Gastro-esophageal reflux disease without esophagitis: Secondary | ICD-10-CM | POA: Diagnosis not present

## 2022-08-22 DIAGNOSIS — M542 Cervicalgia: Secondary | ICD-10-CM | POA: Diagnosis not present

## 2022-08-22 DIAGNOSIS — E441 Mild protein-calorie malnutrition: Secondary | ICD-10-CM | POA: Diagnosis not present

## 2022-08-22 DIAGNOSIS — F02A2 Dementia in other diseases classified elsewhere, mild, with psychotic disturbance: Secondary | ICD-10-CM | POA: Diagnosis not present

## 2022-08-22 DIAGNOSIS — J9601 Acute respiratory failure with hypoxia: Secondary | ICD-10-CM | POA: Diagnosis not present

## 2022-08-22 DIAGNOSIS — J440 Chronic obstructive pulmonary disease with acute lower respiratory infection: Secondary | ICD-10-CM | POA: Diagnosis not present

## 2022-08-22 DIAGNOSIS — G8929 Other chronic pain: Secondary | ICD-10-CM | POA: Diagnosis not present

## 2022-08-24 DIAGNOSIS — J440 Chronic obstructive pulmonary disease with acute lower respiratory infection: Secondary | ICD-10-CM | POA: Diagnosis not present

## 2022-08-24 DIAGNOSIS — K219 Gastro-esophageal reflux disease without esophagitis: Secondary | ICD-10-CM | POA: Diagnosis not present

## 2022-08-24 DIAGNOSIS — I25119 Atherosclerotic heart disease of native coronary artery with unspecified angina pectoris: Secondary | ICD-10-CM | POA: Diagnosis not present

## 2022-08-24 DIAGNOSIS — G9341 Metabolic encephalopathy: Secondary | ICD-10-CM | POA: Diagnosis not present

## 2022-08-24 DIAGNOSIS — I700 Atherosclerosis of aorta: Secondary | ICD-10-CM | POA: Diagnosis not present

## 2022-08-24 DIAGNOSIS — H9192 Unspecified hearing loss, left ear: Secondary | ICD-10-CM | POA: Diagnosis not present

## 2022-08-24 DIAGNOSIS — D509 Iron deficiency anemia, unspecified: Secondary | ICD-10-CM | POA: Diagnosis not present

## 2022-08-24 DIAGNOSIS — F02A2 Dementia in other diseases classified elsewhere, mild, with psychotic disturbance: Secondary | ICD-10-CM | POA: Diagnosis not present

## 2022-08-24 DIAGNOSIS — G8929 Other chronic pain: Secondary | ICD-10-CM | POA: Diagnosis not present

## 2022-08-24 DIAGNOSIS — F02A3 Dementia in other diseases classified elsewhere, mild, with mood disturbance: Secondary | ICD-10-CM | POA: Diagnosis not present

## 2022-08-24 DIAGNOSIS — H3530 Unspecified macular degeneration: Secondary | ICD-10-CM | POA: Diagnosis not present

## 2022-08-24 DIAGNOSIS — F02A4 Dementia in other diseases classified elsewhere, mild, with anxiety: Secondary | ICD-10-CM | POA: Diagnosis not present

## 2022-08-24 DIAGNOSIS — M5481 Occipital neuralgia: Secondary | ICD-10-CM | POA: Diagnosis not present

## 2022-08-24 DIAGNOSIS — M1990 Unspecified osteoarthritis, unspecified site: Secondary | ICD-10-CM | POA: Diagnosis not present

## 2022-08-24 DIAGNOSIS — E441 Mild protein-calorie malnutrition: Secondary | ICD-10-CM | POA: Diagnosis not present

## 2022-08-24 DIAGNOSIS — J4489 Other specified chronic obstructive pulmonary disease: Secondary | ICD-10-CM | POA: Diagnosis not present

## 2022-08-24 DIAGNOSIS — M542 Cervicalgia: Secondary | ICD-10-CM | POA: Diagnosis not present

## 2022-08-24 DIAGNOSIS — I252 Old myocardial infarction: Secondary | ICD-10-CM | POA: Diagnosis not present

## 2022-08-24 DIAGNOSIS — H40053 Ocular hypertension, bilateral: Secondary | ICD-10-CM | POA: Diagnosis not present

## 2022-08-24 DIAGNOSIS — I350 Nonrheumatic aortic (valve) stenosis: Secondary | ICD-10-CM | POA: Diagnosis not present

## 2022-08-24 DIAGNOSIS — J9601 Acute respiratory failure with hypoxia: Secondary | ICD-10-CM | POA: Diagnosis not present

## 2022-08-24 DIAGNOSIS — I6529 Occlusion and stenosis of unspecified carotid artery: Secondary | ICD-10-CM | POA: Diagnosis not present

## 2022-08-31 DIAGNOSIS — J9601 Acute respiratory failure with hypoxia: Secondary | ICD-10-CM | POA: Diagnosis not present

## 2022-08-31 DIAGNOSIS — F02A3 Dementia in other diseases classified elsewhere, mild, with mood disturbance: Secondary | ICD-10-CM | POA: Diagnosis not present

## 2022-08-31 DIAGNOSIS — G9341 Metabolic encephalopathy: Secondary | ICD-10-CM | POA: Diagnosis not present

## 2022-08-31 DIAGNOSIS — K219 Gastro-esophageal reflux disease without esophagitis: Secondary | ICD-10-CM | POA: Diagnosis not present

## 2022-08-31 DIAGNOSIS — M5481 Occipital neuralgia: Secondary | ICD-10-CM | POA: Diagnosis not present

## 2022-08-31 DIAGNOSIS — F02A4 Dementia in other diseases classified elsewhere, mild, with anxiety: Secondary | ICD-10-CM | POA: Diagnosis not present

## 2022-08-31 DIAGNOSIS — G8929 Other chronic pain: Secondary | ICD-10-CM | POA: Diagnosis not present

## 2022-08-31 DIAGNOSIS — I6529 Occlusion and stenosis of unspecified carotid artery: Secondary | ICD-10-CM | POA: Diagnosis not present

## 2022-08-31 DIAGNOSIS — I252 Old myocardial infarction: Secondary | ICD-10-CM | POA: Diagnosis not present

## 2022-08-31 DIAGNOSIS — H9192 Unspecified hearing loss, left ear: Secondary | ICD-10-CM | POA: Diagnosis not present

## 2022-08-31 DIAGNOSIS — M1990 Unspecified osteoarthritis, unspecified site: Secondary | ICD-10-CM | POA: Diagnosis not present

## 2022-08-31 DIAGNOSIS — J4489 Other specified chronic obstructive pulmonary disease: Secondary | ICD-10-CM | POA: Diagnosis not present

## 2022-08-31 DIAGNOSIS — I25119 Atherosclerotic heart disease of native coronary artery with unspecified angina pectoris: Secondary | ICD-10-CM | POA: Diagnosis not present

## 2022-08-31 DIAGNOSIS — I700 Atherosclerosis of aorta: Secondary | ICD-10-CM | POA: Diagnosis not present

## 2022-08-31 DIAGNOSIS — J440 Chronic obstructive pulmonary disease with acute lower respiratory infection: Secondary | ICD-10-CM | POA: Diagnosis not present

## 2022-08-31 DIAGNOSIS — F02A2 Dementia in other diseases classified elsewhere, mild, with psychotic disturbance: Secondary | ICD-10-CM | POA: Diagnosis not present

## 2022-08-31 DIAGNOSIS — M542 Cervicalgia: Secondary | ICD-10-CM | POA: Diagnosis not present

## 2022-08-31 DIAGNOSIS — D509 Iron deficiency anemia, unspecified: Secondary | ICD-10-CM | POA: Diagnosis not present

## 2022-08-31 DIAGNOSIS — E441 Mild protein-calorie malnutrition: Secondary | ICD-10-CM | POA: Diagnosis not present

## 2022-08-31 DIAGNOSIS — I350 Nonrheumatic aortic (valve) stenosis: Secondary | ICD-10-CM | POA: Diagnosis not present

## 2022-08-31 DIAGNOSIS — H3530 Unspecified macular degeneration: Secondary | ICD-10-CM | POA: Diagnosis not present

## 2022-08-31 DIAGNOSIS — H40053 Ocular hypertension, bilateral: Secondary | ICD-10-CM | POA: Diagnosis not present

## 2022-09-12 ENCOUNTER — Ambulatory Visit: Payer: Self-pay | Admitting: *Deleted

## 2022-09-12 ENCOUNTER — Encounter: Payer: Self-pay | Admitting: *Deleted

## 2022-09-12 NOTE — Patient Instructions (Signed)
Visit Information  Thank you for taking time to visit with me today. Please don't hesitate to contact me if I can be of assistance to you.   Following are the goals we discussed today:   Goals Addressed             This Visit's Progress    [facility] care coordination services   On track    Interventions Today    Flowsheet Row Most Recent Value  Chronic Disease   Chronic disease during today's visit Chronic Obstructive Pulmonary Disease (COPD), Hypertension (HTN), Other  [mild cognitive and macular degenerative disease]  General Interventions   General Interventions Discussed/Reviewed General Interventions Reviewed, Doctor Visits, Community Resources, Horticulturist, commercial (DME)  Doctor Visits Discussed/Reviewed Doctor Visits Reviewed, PCP  Durable Medical Equipment (DME) [facility]  PCP/Specialist Visits Compliance with follow-up visit  Exercise Interventions   Exercise Discussed/Reviewed Exercise Discussed, Physical Activity, Assistive device use and maintanence  Physical Activity Discussed/Reviewed Physical Activity Discussed, Types of exercise, Home Exercise Program (HEP)  Education Interventions   Education Provided Provided Education  [availability of care coordination services as needed]  Provided Verbal Education On Community Resources  Mental Health Interventions   Mental Health Discussed/Reviewed Mental Health Discussed  Nutrition Interventions   Nutrition Discussed/Reviewed Nutrition Discussed, Portion sizes  Pharmacy Interventions   Pharmacy Dicussed/Reviewed Pharmacy Topics Reviewed, Affording Medications              Our next appointment is by telephone on 10/12/22 at 1000  Please call the care guide team at [phone] if you need to cancel or reschedule your appointment.   If you are experiencing a Mental Health or Behavioral Health Crisis or need someone to talk to, please call the Suicide and Crisis Lifeline: 988 call the [place] National Suicide Prevention  Lifeline: (385) 634-0892 or TTY: 681-747-3116 TTY 419-584-4566) to talk to a trained counselor call 1-800-273-TALK (toll free, 24 hour hotline) call the [facility]: 574-870-0600 call 911   Patient verbalizes understanding of instructions and care plan provided today and agrees to view in MyChart. Active MyChart status and patient understanding of how to access instructions and care plan via MyChart confirmed with patient.     The patient has been provided with contact information for the care management team and has been advised to call with any health related questions or concerns.   [place]ina Riches L. Noelle Penner, RN, BSN, CCM, Care Management Coordinator [phone]

## 2022-09-12 NOTE — Patient Outreach (Signed)
  Care Coordination   Follow Up Visit Note   09/12/2022 Name: Beverly [name] MRN: [id number] DOB: 06-May-1932  Beverly [name] is a 89 y.o. year old female who sees Margo Aye, Kathleene Hazel, MD for primary care. I spoke with Beverly [name], daughter of  Beverly [name] by phone today.  What matters to the patients health and wellness today?  "She is doing well." "Being spoiled"  Beverly [name] reports with assessment of medical conditions and social determinants of health (SDOH) concerns that the patient is doing well. Beverly [name] has recently had some surgery herself but is progressing Beverly [name] is seen by her daughter, her sitter and her sisters daily. She has completed her home health services and continues to do her exercises taught. She continues to have palliative care visits monthly. She does not need further equipment. Her appetite is fair but steady. No worsening complaints of pain other than her arthritis pain. She takes her breathing treatment and has no reported worsening respiratory symptoms.  Beverly [name] reports Beverly Shells is coping well considering     Goals Addressed             This Visit's Progress    [facility] care coordination services   On track    Interventions Today    Flowsheet Row Most Recent Value  Chronic Disease   Chronic disease during today's visit Chronic Obstructive Pulmonary Disease (COPD), Hypertension (HTN), Other  [mild cognitive and macular degenerative disease]  General Interventions   General Interventions Discussed/Reviewed General Interventions Reviewed, Doctor Visits, Community Resources, Horticulturist, commercial (DME)  Doctor Visits Discussed/Reviewed Doctor Visits Reviewed, PCP  Durable Medical Equipment (DME) Walker  PCP/Specialist Visits Compliance with follow-up visit  Exercise Interventions   Exercise Discussed/Reviewed Exercise Discussed, Physical Activity, Assistive device use and maintanence  Physical Activity Discussed/Reviewed Physical Activity Discussed, Types of exercise, Home  Exercise Program (HEP)  Education Interventions   Education Provided Provided Education  [place] of care coordination services as needed]  Provided Verbal Education On [employer]  Mental Health Interventions   Mental Health Discussed/Reviewed Mental Health Discussed  Nutrition Interventions   Nutrition Discussed/Reviewed Nutrition Discussed, Portion sizes  Pharmacy Interventions   Pharmacy Dicussed/Reviewed Pharmacy Topics Reviewed, Affording Medications              SDOH assessments and interventions completed:  Yes  SDOH Interventions Today    Flowsheet Row Most Recent Value  SDOH Interventions   Food Insecurity Interventions Intervention Not Indicated  Housing Interventions Intervention Not Indicated  Transportation Interventions Intervention Not Indicated  Utilities Interventions Intervention Not Indicated  Financial Strain Interventions Intervention Not Indicated  Stress Interventions Intervention Not Indicated  Social Connections Interventions Intervention Not Indicated        Care Coordination Interventions:  Yes, provided   Follow up plan: Follow up call scheduled for 10/12/22    Encounter Outcome:  Patient Visit Completed    [name], RN, BSN, CCM, Care Management Coordinator [phone]

## 2022-10-05 DIAGNOSIS — J449 Chronic obstructive pulmonary disease, unspecified: Secondary | ICD-10-CM | POA: Diagnosis not present

## 2022-10-05 DIAGNOSIS — Z515 Encounter for palliative care: Secondary | ICD-10-CM | POA: Diagnosis not present

## 2022-10-12 ENCOUNTER — Ambulatory Visit: Payer: Self-pay | Admitting: *Deleted

## 2022-10-12 NOTE — Patient Instructions (Addendum)
Visit Information  Thank you for taking time to visit with me today. Please don't hesitate to contact me if I can be of assistance to you.   Following are the goals we discussed today:   Goals Addressed             This Visit's Progress    palliatve care services, pneumonia prevention/COPD- care coordination services   On track    Interventions Today    Flowsheet Row Most Recent Value  Chronic Disease   Chronic disease during today's visit Chronic Obstructive Pulmonary Disease (COPD)  General Interventions   General Interventions Discussed/Reviewed General Interventions Reviewed  Doctor Visits Discussed/Reviewed Doctor Visits Reviewed  PCP/Specialist Visits Compliance with follow-up visit  Exercise Interventions   Exercise Discussed/Reviewed Exercise Reviewed  Education Interventions   Education Provided Provided Education  Provided Verbal Education On Nutrition, Medication, Other  [pneumonia prevention]  Mental Health Interventions   Mental Health Discussed/Reviewed Mental Health Discussed, Coping Strategies  Nutrition Interventions   Nutrition Discussed/Reviewed Nutrition Discussed, Decreasing salt  Pharmacy Interventions   Pharmacy Dicussed/Reviewed Pharmacy Topics Discussed, Medications and their functions  Safety Interventions   Safety Discussed/Reviewed Safety Discussed, Home Safety  Home Safety Assistive Devices              Our next appointment is by telephone on 12/07/22 at 1000  Please call the care guide team at [phone] if you need to cancel or reschedule your appointment.   If you are experiencing a Mental Health or Behavioral Health Crisis or need someone to talk to, please call the Suicide and Crisis Lifeline: 988 call the [place] National Suicide Prevention Lifeline: 857-202-0839 or TTY: 331 332 8552 TTY (782)296-3444) to talk to a trained counselor call 1-800-273-TALK (toll free, 24 hour hotline) call the [facility]:  [phone] call 911   Patient verbalizes understanding of instructions and care plan provided today and agrees to view in MyChart. Active MyChart status and patient understanding of how to access instructions and care plan via MyChart confirmed with patient.     The patient has been provided with contact information for the care management team and has been advised to call with any health related questions or concerns.   Kendle Erker L. Noelle Penner, RN, BSN, [facility]  VBCI Care Management Coordinator  [phone]  Fax: [phone]

## 2022-10-12 NOTE — Patient Outreach (Addendum)
  Care Coordination   Follow Up Visit Note   10/12/2022 Name: Beverly [name] MRN: [id number] DOB: 08/18/1932  Beverly [name] is a 89 y.o. year old female who sees Margo Aye, Kathleene Hazel, MD for primary care. I spoke with Beverly [name] her daughter  Beverly [name] by phone today.  What matters to the patients health and wellness today?  COPD, Palliative care, Hypertension/cardiac   Doing very well per Beverly [name]. Improving since discharge home Continues with palliative care visits monthly - was  a little congested during this visit Patient has a productive cough of clear to white secretions only   Appetite better   Goals Addressed             This Visit's Progress    palliatve care services, pneumonia prevention/COPD- care coordination services   On track    Interventions Today    Flowsheet Row Most Recent Value  Chronic Disease   Chronic disease during today's visit Chronic Obstructive Pulmonary Disease (COPD)  General Interventions   General Interventions Discussed/Reviewed General Interventions Reviewed  Doctor Visits Discussed/Reviewed Doctor Visits Reviewed  PCP/Specialist Visits Compliance with follow-up visit  Exercise Interventions   Exercise Discussed/Reviewed Exercise Reviewed  Education Interventions   Education Provided Provided Education  Provided Verbal Education On Nutrition, Medication, Other  [pneumonia prevention]  Mental Health Interventions   Mental Health Discussed/Reviewed Mental Health Discussed, Coping Strategies  Nutrition Interventions   Nutrition Discussed/Reviewed Nutrition Discussed, Decreasing salt  Pharmacy Interventions   Pharmacy Dicussed/Reviewed Pharmacy Topics Discussed, Medications and their functions  Safety Interventions   Safety Discussed/Reviewed Safety Discussed, Home Safety  Home Safety Assistive Devices              SDOH assessments and interventions completed:  No     Care Coordination Interventions:  Yes, provided   Follow up plan: Follow  up call scheduled for 12/07/22    Encounter Outcome:  Patient Visit Completed   [name], RN, BSN, [facility]  VBCI Care Management Coordinator  [phone]  Fax: [phone]

## 2022-10-17 NOTE — Progress Notes (Deleted)
  Cardiology Office Note:  .   Date:  10/17/2022  ID:  LILLIANNA SABEL, DOB 1932/04/19, MRN [id number] PCP: Benita Stabile, MD  [facility] Providers Cardiologist:  Nona Dell, MD { Click to update primary MD,subspecialty MD or APP then REFRESH:1}   History of Present Illness: .   Beverly [name] is a 89 y.o. female with history of CAD with cardiac arrest 2020, HTN, HLD, COPD, history of subdural hematoma  Last seen by [place] 12/2019. Hospitalized 07/2022 with PNA.  ROS: ***  Studies Reviewed: Marland Kitchen         Prior CV Studies: {Select studies to display:26339}  Echocardiogram 01/10/2018   Study Conclusions   - Left ventricle: The cavity size was normal. Wall thickness was    increased in a pattern of mild LVH. Basal to mid inferior    akinesis. Basal inferoseptal akinesis. The estimated ejection    fraction was 55%. Doppler parameters are consistent with abnormal    left ventricular relaxation (grade 1 diastolic dysfunction).  - Aortic valve: Trileaflet; moderately calcified leaflets. There    was mild stenosis. Mean gradient (S): 13 mm Hg. Valve area    (Vmean): 1.74 cm^2.  - Mitral valve: There was trivial regurgitation.  - Left atrium: The atrium was mildly dilated.  - Right ventricle: The cavity size was normal. Systolic function    was normal.  - Pulmonary arteries: No complete TR doppler jet so unable to    estimate PA systolic pressure.  - Inferior vena cava: The vessel was normal in size. The    respirophasic diameter changes were in the normal range (>= 50%),    consistent with normal central venous pressure.   Impressions:   - Normal LV size with mild LV hypertrophy. EF 55% with basal to mid    inferior akinesis and basal inferoseptal akinesis. Normal RV size    and systolic function. Mild aortic stenosis.    Cardiac catheterization 01/10/2018 Mid RCA lesion is 99% stenosed. A drug-eluting stent was successfully placed. Post intervention, there is a 0% residual  stenosis. Diagnostic Dominance: Right  Intervention         Risk Assessment/Calculations:   {Does this patient have ATRIAL FIBRILLATION?[phone]} No BP recorded.  {Refresh Note OR Click here to enter BP  :1}***       Physical Exam:   VS:  There were no vitals taken for this visit.   Wt Readings from Last 3 Encounters:  08/03/22 140 lb 9.6 oz (63.8 kg)  07/19/22 137 lb 3.2 oz (62.2 kg)  07/14/22 143 lb 4.8 oz (65 kg)    GEN: Well nourished, well developed in no acute distress NECK: No JVD; No carotid bruits CARDIAC: ***RRR, no murmurs, rubs, gallops RESPIRATORY:  Clear to auscultation without rales, wheezing or rhonchi  ABDOMEN: Soft, non-tender, non-distended EXTREMITIES:  No edema; No deformity   ASSESSMENT AND PLAN: .    CAD STEMI 2020 with cardiac arrest(polymorphic VT s/p DES to RCA  HTN  TIA  HLD  COPD  History of subdural hematoma      {Are you ordering a CV Procedure (e.g. stress test, cath, DCCV, TEE, etc)?   Press F2        :[id number]}  Dispo: ***  Signed, Jacolyn Reedy, PA-C

## 2022-10-24 ENCOUNTER — Ambulatory Visit: Payer: Medicare Other | Admitting: Physician Assistant

## 2022-12-05 DIAGNOSIS — Z515 Encounter for palliative care: Secondary | ICD-10-CM | POA: Diagnosis not present

## 2022-12-05 DIAGNOSIS — J449 Chronic obstructive pulmonary disease, unspecified: Secondary | ICD-10-CM | POA: Diagnosis not present

## 2022-12-06 NOTE — Progress Notes (Signed)
Cardiology Office Note:  .   Date:  12/19/2022  ID:  Beverly [name], DOB 05-04-1932, MRN [id number] PCP: Benita Stabile, MD  [facility] Providers Cardiologist:  Nona Dell, MD    History of Present Illness: .   Beverly [name] is a 89 y.o. female PMH of TIA, hypertension, STEMI inferior wall, cardiac arrest, COPD, subdural hematoma, hyponatremia, dyslipidemia, hypokalemia, and hypomagnesemia.  Cardiac catheterization 01/10/2018 showed mid RCA lesion 99%.  She received PCI with DES x1 to her mid RCA. Hasn't been seen by [place] since 2021.   Patient comes in with her daughter. She walks some with a walker but mostly in a wheelchair. Denies chest pain, dyspnea, edema, palpitations, dizziness.  ROS:    Studies Reviewed: Marland Kitchen         Prior CV Studies:   Echocardiogram 01/10/2018   Study Conclusions   - Left ventricle: The cavity size was normal. Wall thickness was    increased in a pattern of mild LVH. Basal to mid inferior    akinesis. Basal inferoseptal akinesis. The estimated ejection    fraction was 55%. Doppler parameters are consistent with abnormal    left ventricular relaxation (grade 1 diastolic dysfunction).  - Aortic valve: Trileaflet; moderately calcified leaflets. There    was mild stenosis. Mean gradient (S): 13 mm Hg. Valve area    (Vmean): 1.74 cm^2.  - Mitral valve: There was trivial regurgitation.  - Left atrium: The atrium was mildly dilated.  - Right ventricle: The cavity size was normal. Systolic function    was normal.  - Pulmonary arteries: No complete TR doppler jet so unable to    estimate PA systolic pressure.  - Inferior vena cava: The vessel was normal in size. The    respirophasic diameter changes were in the normal range (>= 50%),    consistent with normal central venous pressure.   Impressions:   - Normal LV size with mild LV hypertrophy. EF 55% with basal to mid    inferior akinesis and basal inferoseptal akinesis. Normal RV size    and systolic  function. Mild aortic stenosis.    Cardiac catheterization 01/10/2018 Mid RCA lesion is 99% stenosed. A drug-eluting stent was successfully placed. Post intervention, there is a 0% residual stenosis.  Risk Assessment/Calculations:             Physical Exam:   VS:  BP (!) 108/56   Pulse 84   Ht 5' (1.524 m)   Wt 144 lb 9.6 oz (65.6 kg)   SpO2 93%   BMI 28.24 kg/m    Wt Readings from Last 3 Encounters:  12/19/22 144 lb 9.6 oz (65.6 kg)  08/03/22 140 lb 9.6 oz (63.8 kg)  07/19/22 137 lb 3.2 oz (62.2 kg)    GEN: Well nourished, well developed in no acute distress NECK: No JVD; No carotid bruits CARDIAC:  RRR, 2/6 systolic murmur LSB RESPIRATORY:  Clear to auscultation without rales, wheezing or rhonchi  ABDOMEN: Soft, non-tender, non-distended EXTREMITIES:  No edema; No deformity   ASSESSMENT AND PLAN: .     CAD STEMI 2020 with cardiac arrest(polymorphic VT s/p DES to RCA-no angina, continue ASA, amlodipine, coreg, lipitor. PCP does quarterly blood work.  HTN-BP controlled  Aortic stenosis mild on echo 2020. No symptoms and prefer not to pursue echo at this time.  TIA-on ASA  HLD-managed by PCP  COPD  History of subdural hematoma        Dispo: f/u in 1  yr  Signed, Jacolyn Reedy, PA-C

## 2022-12-07 ENCOUNTER — Ambulatory Visit: Payer: Self-pay | Admitting: *Deleted

## 2022-12-07 NOTE — Patient Instructions (Signed)
Visit Information  Thank you for taking time to visit with me today. Please don't hesitate to contact me if I can be of assistance to you.   Following are the goals we discussed today:   Goals Addressed             This Visit's Progress    palliatve care services, pneumonia prevention/COPD- care coordination services   On track    Interventions Today    Flowsheet Row Most Recent Value  Chronic Disease   Chronic disease during today's visit Chronic Obstructive Pulmonary Disease (COPD), Hypertension (HTN)  General Interventions   General Interventions Discussed/Reviewed General Interventions Reviewed, Doctor Visits, Community Resources  Doctor Visits Discussed/Reviewed Doctor Visits Reviewed, PCP  PCP/Specialist Visits Compliance with follow-up visit  Exercise Interventions   Exercise Discussed/Reviewed Exercise Reviewed, Physical Activity  Physical Activity Discussed/Reviewed Physical Activity Reviewed  Mental Health Interventions   Mental Health Discussed/Reviewed Mental Health Reviewed, Coping Strategies, Other  [confirmed palliative care remains active]  Pharmacy Interventions   Pharmacy Dicussed/Reviewed Pharmacy Topics Reviewed, Affording Medications  Safety Interventions   Safety Discussed/Reviewed Safety Reviewed, Home Safety  Home Safety Assistive Devices              Our next appointment is by telephone on 03/07/23 at 1000  Please call the care guide team at [phone] if you need to cancel or reschedule your appointment.   If you are experiencing a Mental Health or Behavioral Health Crisis or need someone to talk to, please call the Suicide and Crisis Lifeline: 988 call the [place] National Suicide Prevention Lifeline: 224-654-8024 or TTY: 709-252-0231 TTY 3674047258) to talk to a trained counselor call 1-800-273-TALK (toll free, 24 hour hotline) call the [facility]: ([phone] call 911   Patient verbalizes understanding of  instructions and care plan provided today and agrees to view in MyChart. Active MyChart status and patient understanding of how to access instructions and care plan via MyChart confirmed with patient.     The patient has been provided with contact information for the care management team and has been advised to call with any health related questions or concerns.   Charissa Knowles L. Noelle Penner, RN, BSN, [facility]  VBCI Care Management Coordinator  [phone]  Fax: [phone]

## 2022-12-07 NOTE — Patient Outreach (Signed)
  Care Coordination   Follow Up Visit Note   12/07/2022 Name: Beverly [name] MRN: [id number] DOB: [birth year]  Beverly [name] is a 89 y.o. year old female who sees Beverly [name], Beverly Hazel, MD for primary care. I spoke with  her daughter, Beverly [name] of Beverly [name] by phone today.  What matters to the patients health and wellness today?  Doing well per daughter  Today is her birthday. Patient is resting Patient plans to celebrate her birthday at home related to the cold weather. Patient has been getting up to greet family and friends. Beverly [name] denies worsening medical concerns or other social needs today   Her daughter has called in her pain medicine today, pending pick up Beverly [name] wants her pcp to be aware that Beverly [name] is doing well.  Palliative care still active - with recent call Daughter reports being aware of Palliative care new facility Beverly [name] appreciative of birthday outreach and agrees to further [employer]    Goals Addressed             This Visit's Progress    palliatve care services, pneumonia prevention/COPD- care coordination services   On track    Interventions Today    Flowsheet Row Most Recent Value  Chronic Disease   Chronic disease during today's visit Chronic Obstructive Pulmonary Disease (COPD), Hypertension (HTN)  General Interventions   General Interventions Discussed/Reviewed General Interventions Reviewed, Doctor Visits, Community Resources  Doctor Visits Discussed/Reviewed Doctor Visits Reviewed, PCP  PCP/Specialist Visits Compliance with follow-up visit  Exercise Interventions   Exercise Discussed/Reviewed Exercise Reviewed, Physical Activity  Physical Activity Discussed/Reviewed Physical Activity Reviewed  Mental Health Interventions   Mental Health Discussed/Reviewed Mental Health Reviewed, Coping Strategies, Other  [confirmed palliative care remains active]  Pharmacy Interventions   Pharmacy Dicussed/Reviewed Pharmacy Topics Reviewed, Affording Medications  Safety  Interventions   Safety Discussed/Reviewed Safety Reviewed, Home Safety  Home Safety Assistive Devices              SDOH assessments and interventions completed:  No     Care Coordination Interventions:  Yes, provided   Follow up plan: Follow up call scheduled for 03/07/23    Encounter Outcome:  Patient Visit Completed    Beverly Bradford L. Noelle Penner, RN, BSN, [facility]  VBCI Care Management Coordinator  [phone]  Fax: [phone]

## 2022-12-19 ENCOUNTER — Ambulatory Visit: Payer: Medicare Other | Attending: Cardiology | Admitting: Physician Assistant

## 2022-12-19 ENCOUNTER — Encounter: Payer: Self-pay | Admitting: Physician Assistant

## 2022-12-19 VITALS — BP 108/56 | HR 84 | Ht 60.0 in | Wt 144.6 lb

## 2022-12-19 DIAGNOSIS — G459 Transient cerebral ischemic attack, unspecified: Secondary | ICD-10-CM | POA: Diagnosis not present

## 2022-12-19 DIAGNOSIS — I350 Nonrheumatic aortic (valve) stenosis: Secondary | ICD-10-CM

## 2022-12-19 DIAGNOSIS — I25119 Atherosclerotic heart disease of native coronary artery with unspecified angina pectoris: Secondary | ICD-10-CM

## 2022-12-19 DIAGNOSIS — I10 Essential (primary) hypertension: Secondary | ICD-10-CM

## 2022-12-19 DIAGNOSIS — J42 Unspecified chronic bronchitis: Secondary | ICD-10-CM

## 2022-12-19 DIAGNOSIS — E785 Hyperlipidemia, unspecified: Secondary | ICD-10-CM

## 2022-12-19 DIAGNOSIS — Z87828 Personal history of other (healed) physical injury and trauma: Secondary | ICD-10-CM

## 2022-12-19 NOTE — Patient Instructions (Signed)
Medication Instructions:  Your physician recommends that you continue on your current medications as directed. Please refer to the Current Medication list given to you today.  *If you need a refill on your cardiac medications before your next appointment, please call your pharmacy*   Lab Work: NONE   If you have labs (blood work) drawn today and your tests are completely normal, you will receive your results only by: [facility] (if you have MyChart) OR A paper copy in the mail If you have any lab test that is abnormal or we need to change your treatment, we will call you to review the results.   Testing/Procedures: NONE    Follow-Up: At [facility], you and your health needs are our priority.  As part of our continuing mission to provide you with exceptional heart care, we have created designated Provider Care Teams.  These Care Teams include your primary Cardiologist (physician) and Advanced Practice Providers (APPs -  Physician Assistants and Nurse Practitioners) who all work together to provide you with the care you need, when you need it.  We recommend signing up for the patient portal called "MyChart".  Sign up information is provided on this After Visit Summary.  MyChart is used to connect with patients for Virtual Visits (Telemedicine).  Patients are able to view lab/test results, encounter notes, upcoming appointments, etc.  Non-urgent messages can be sent to your provider as well.   To learn more about what you can do with MyChart, go to [email or web address].    Your next appointment:   1 year(s)  Provider:   You may see Rozann Lesches, MD or one of the following Advanced Practice Providers on your designated Care Team:   Bernerd Pho, PA-C  Ermalinda Barrios, PA-C     Other Instructions Thank you for choosing [facility]!

## 2023-01-03 DIAGNOSIS — M542 Cervicalgia: Secondary | ICD-10-CM | POA: Diagnosis not present

## 2023-01-03 DIAGNOSIS — I10 Essential (primary) hypertension: Secondary | ICD-10-CM | POA: Diagnosis not present

## 2023-01-03 DIAGNOSIS — M6281 Muscle weakness (generalized): Secondary | ICD-10-CM | POA: Diagnosis not present

## 2023-01-03 DIAGNOSIS — R1030 Lower abdominal pain, unspecified: Secondary | ICD-10-CM | POA: Diagnosis not present

## 2023-01-03 DIAGNOSIS — K582 Mixed irritable bowel syndrome: Secondary | ICD-10-CM | POA: Diagnosis not present

## 2023-01-03 DIAGNOSIS — R7301 Impaired fasting glucose: Secondary | ICD-10-CM | POA: Diagnosis not present

## 2023-01-03 DIAGNOSIS — M5481 Occipital neuralgia: Secondary | ICD-10-CM | POA: Diagnosis not present

## 2023-01-03 DIAGNOSIS — D509 Iron deficiency anemia, unspecified: Secondary | ICD-10-CM | POA: Diagnosis not present

## 2023-01-03 DIAGNOSIS — N390 Urinary tract infection, site not specified: Secondary | ICD-10-CM | POA: Diagnosis not present

## 2023-01-25 DIAGNOSIS — J449 Chronic obstructive pulmonary disease, unspecified: Secondary | ICD-10-CM | POA: Diagnosis not present

## 2023-01-25 DIAGNOSIS — Z515 Encounter for palliative care: Secondary | ICD-10-CM | POA: Diagnosis not present

## 2023-02-10 DIAGNOSIS — R5381 Other malaise: Secondary | ICD-10-CM | POA: Diagnosis not present

## 2023-02-10 DIAGNOSIS — R69 Illness, unspecified: Secondary | ICD-10-CM | POA: Diagnosis not present

## 2023-02-24 DIAGNOSIS — R6889 Other general symptoms and signs: Secondary | ICD-10-CM | POA: Diagnosis not present

## 2023-02-24 DIAGNOSIS — R300 Dysuria: Secondary | ICD-10-CM | POA: Diagnosis not present

## 2023-03-01 ENCOUNTER — Emergency Department (HOSPITAL_COMMUNITY): Payer: Medicare Other

## 2023-03-01 ENCOUNTER — Inpatient Hospital Stay (HOSPITAL_COMMUNITY)
Admission: EM | Admit: 2023-03-01 | Discharge: 2023-03-03 | DRG: 177 | Disposition: A | Discharge status: Skilled Nursing Facility | Payer: Medicare Other | Attending: Internal Medicine | Admitting: Internal Medicine

## 2023-03-01 ENCOUNTER — Encounter (HOSPITAL_COMMUNITY): Payer: Self-pay

## 2023-03-01 ENCOUNTER — Other Ambulatory Visit: Payer: Self-pay

## 2023-03-01 DIAGNOSIS — A498 Other bacterial infections of unspecified site: Secondary | ICD-10-CM | POA: Diagnosis not present

## 2023-03-01 DIAGNOSIS — T68XXXA Hypothermia, initial encounter: Secondary | ICD-10-CM | POA: Diagnosis not present

## 2023-03-01 DIAGNOSIS — Z880 Allergy status to penicillin: Secondary | ICD-10-CM

## 2023-03-01 DIAGNOSIS — Z8674 Personal history of sudden cardiac arrest: Secondary | ICD-10-CM | POA: Diagnosis not present

## 2023-03-01 DIAGNOSIS — G3184 Mild cognitive impairment, so stated: Secondary | ICD-10-CM | POA: Diagnosis not present

## 2023-03-01 DIAGNOSIS — G40109 Localization-related (focal) (partial) symptomatic epilepsy and epileptic syndromes with simple partial seizures, not intractable, without status epilepticus: Secondary | ICD-10-CM | POA: Diagnosis present

## 2023-03-01 DIAGNOSIS — M6281 Muscle weakness (generalized): Secondary | ICD-10-CM | POA: Diagnosis not present

## 2023-03-01 DIAGNOSIS — J9601 Acute respiratory failure with hypoxia: Secondary | ICD-10-CM | POA: Diagnosis not present

## 2023-03-01 DIAGNOSIS — R488 Other symbolic dysfunctions: Secondary | ICD-10-CM | POA: Diagnosis not present

## 2023-03-01 DIAGNOSIS — I700 Atherosclerosis of aorta: Secondary | ICD-10-CM | POA: Diagnosis present

## 2023-03-01 DIAGNOSIS — Z888 Allergy status to other drugs, medicaments and biological substances status: Secondary | ICD-10-CM

## 2023-03-01 DIAGNOSIS — I252 Old myocardial infarction: Secondary | ICD-10-CM

## 2023-03-01 DIAGNOSIS — J440 Chronic obstructive pulmonary disease with acute lower respiratory infection: Secondary | ICD-10-CM | POA: Diagnosis present

## 2023-03-01 DIAGNOSIS — J40 Bronchitis, not specified as acute or chronic: Secondary | ICD-10-CM | POA: Diagnosis not present

## 2023-03-01 DIAGNOSIS — H353221 Exudative age-related macular degeneration, left eye, with active choroidal neovascularization: Secondary | ICD-10-CM | POA: Diagnosis not present

## 2023-03-01 DIAGNOSIS — J690 Pneumonitis due to inhalation of food and vomit: Principal | ICD-10-CM | POA: Diagnosis present

## 2023-03-01 DIAGNOSIS — Z955 Presence of coronary angioplasty implant and graft: Secondary | ICD-10-CM | POA: Diagnosis not present

## 2023-03-01 DIAGNOSIS — I25119 Atherosclerotic heart disease of native coronary artery with unspecified angina pectoris: Secondary | ICD-10-CM | POA: Diagnosis not present

## 2023-03-01 DIAGNOSIS — H40053 Ocular hypertension, bilateral: Secondary | ICD-10-CM | POA: Diagnosis not present

## 2023-03-01 DIAGNOSIS — H401131 Primary open-angle glaucoma, bilateral, mild stage: Secondary | ICD-10-CM | POA: Diagnosis not present

## 2023-03-01 DIAGNOSIS — Z91012 Allergy to eggs: Secondary | ICD-10-CM

## 2023-03-01 DIAGNOSIS — J42 Unspecified chronic bronchitis: Secondary | ICD-10-CM

## 2023-03-01 DIAGNOSIS — K219 Gastro-esophageal reflux disease without esophagitis: Secondary | ICD-10-CM | POA: Diagnosis not present

## 2023-03-01 DIAGNOSIS — Z741 Need for assistance with personal care: Secondary | ICD-10-CM | POA: Diagnosis not present

## 2023-03-01 DIAGNOSIS — E871 Hypo-osmolality and hyponatremia: Secondary | ICD-10-CM | POA: Diagnosis not present

## 2023-03-01 DIAGNOSIS — R531 Weakness: Secondary | ICD-10-CM | POA: Diagnosis not present

## 2023-03-01 DIAGNOSIS — B9689 Other specified bacterial agents as the cause of diseases classified elsewhere: Secondary | ICD-10-CM | POA: Diagnosis present

## 2023-03-01 DIAGNOSIS — R498 Other voice and resonance disorders: Secondary | ICD-10-CM | POA: Diagnosis not present

## 2023-03-01 DIAGNOSIS — Z8701 Personal history of pneumonia (recurrent): Secondary | ICD-10-CM

## 2023-03-01 DIAGNOSIS — Z885 Allergy status to narcotic agent status: Secondary | ICD-10-CM

## 2023-03-01 DIAGNOSIS — Z79899 Other long term (current) drug therapy: Secondary | ICD-10-CM

## 2023-03-01 DIAGNOSIS — R262 Difficulty in walking, not elsewhere classified: Secondary | ICD-10-CM | POA: Diagnosis not present

## 2023-03-01 DIAGNOSIS — G9341 Metabolic encephalopathy: Secondary | ICD-10-CM | POA: Diagnosis not present

## 2023-03-01 DIAGNOSIS — R680 Hypothermia, not associated with low environmental temperature: Secondary | ICD-10-CM | POA: Diagnosis present

## 2023-03-01 DIAGNOSIS — G928 Other toxic encephalopathy: Secondary | ICD-10-CM | POA: Diagnosis not present

## 2023-03-01 DIAGNOSIS — J189 Pneumonia, unspecified organism: Secondary | ICD-10-CM | POA: Diagnosis not present

## 2023-03-01 DIAGNOSIS — I517 Cardiomegaly: Secondary | ICD-10-CM | POA: Diagnosis not present

## 2023-03-01 DIAGNOSIS — Z66 Do not resuscitate: Secondary | ICD-10-CM | POA: Diagnosis present

## 2023-03-01 DIAGNOSIS — R410 Disorientation, unspecified: Secondary | ICD-10-CM | POA: Diagnosis not present

## 2023-03-01 DIAGNOSIS — J984 Other disorders of lung: Secondary | ICD-10-CM | POA: Diagnosis not present

## 2023-03-01 DIAGNOSIS — Z820 Family history of epilepsy and other diseases of the nervous system: Secondary | ICD-10-CM

## 2023-03-01 DIAGNOSIS — F339 Major depressive disorder, recurrent, unspecified: Secondary | ICD-10-CM | POA: Diagnosis present

## 2023-03-01 DIAGNOSIS — Z96653 Presence of artificial knee joint, bilateral: Secondary | ICD-10-CM | POA: Diagnosis present

## 2023-03-01 DIAGNOSIS — D649 Anemia, unspecified: Secondary | ICD-10-CM | POA: Diagnosis not present

## 2023-03-01 DIAGNOSIS — Z791 Long term (current) use of non-steroidal anti-inflammatories (NSAID): Secondary | ICD-10-CM

## 2023-03-01 DIAGNOSIS — N390 Urinary tract infection, site not specified: Secondary | ICD-10-CM | POA: Diagnosis not present

## 2023-03-01 DIAGNOSIS — F419 Anxiety disorder, unspecified: Secondary | ICD-10-CM | POA: Diagnosis present

## 2023-03-01 DIAGNOSIS — R059 Cough, unspecified: Secondary | ICD-10-CM | POA: Diagnosis not present

## 2023-03-01 DIAGNOSIS — R590 Localized enlarged lymph nodes: Secondary | ICD-10-CM | POA: Diagnosis not present

## 2023-03-01 DIAGNOSIS — E7800 Pure hypercholesterolemia, unspecified: Secondary | ICD-10-CM | POA: Diagnosis present

## 2023-03-01 DIAGNOSIS — I10 Essential (primary) hypertension: Secondary | ICD-10-CM | POA: Diagnosis not present

## 2023-03-01 DIAGNOSIS — H353114 Nonexudative age-related macular degeneration, right eye, advanced atrophic with subfoveal involvement: Secondary | ICD-10-CM | POA: Diagnosis not present

## 2023-03-01 DIAGNOSIS — M159 Polyosteoarthritis, unspecified: Secondary | ICD-10-CM | POA: Diagnosis not present

## 2023-03-01 DIAGNOSIS — H353222 Exudative age-related macular degeneration, left eye, with inactive choroidal neovascularization: Secondary | ICD-10-CM | POA: Diagnosis not present

## 2023-03-01 DIAGNOSIS — R1312 Dysphagia, oropharyngeal phase: Secondary | ICD-10-CM | POA: Diagnosis not present

## 2023-03-01 DIAGNOSIS — G8929 Other chronic pain: Secondary | ICD-10-CM | POA: Diagnosis not present

## 2023-03-01 DIAGNOSIS — E785 Hyperlipidemia, unspecified: Secondary | ICD-10-CM | POA: Diagnosis not present

## 2023-03-01 DIAGNOSIS — Z7982 Long term (current) use of aspirin: Secondary | ICD-10-CM

## 2023-03-01 DIAGNOSIS — Z9189 Other specified personal risk factors, not elsewhere classified: Secondary | ICD-10-CM | POA: Diagnosis not present

## 2023-03-01 DIAGNOSIS — J449 Chronic obstructive pulmonary disease, unspecified: Secondary | ICD-10-CM | POA: Diagnosis present

## 2023-03-01 DIAGNOSIS — Z1152 Encounter for screening for COVID-19: Secondary | ICD-10-CM | POA: Diagnosis not present

## 2023-03-01 DIAGNOSIS — Z809 Family history of malignant neoplasm, unspecified: Secondary | ICD-10-CM

## 2023-03-01 DIAGNOSIS — Z9181 History of falling: Secondary | ICD-10-CM | POA: Diagnosis not present

## 2023-03-01 DIAGNOSIS — R918 Other nonspecific abnormal finding of lung field: Secondary | ICD-10-CM | POA: Diagnosis not present

## 2023-03-01 DIAGNOSIS — Z87440 Personal history of urinary (tract) infections: Secondary | ICD-10-CM

## 2023-03-01 DIAGNOSIS — I6782 Cerebral ischemia: Secondary | ICD-10-CM | POA: Diagnosis not present

## 2023-03-01 DIAGNOSIS — H9192 Unspecified hearing loss, left ear: Secondary | ICD-10-CM | POA: Diagnosis present

## 2023-03-01 LAB — BRAIN NATRIURETIC PEPTIDE: B Natriuretic Peptide: 82 pg/mL (ref 0.0–100.0)

## 2023-03-01 LAB — URINALYSIS, ROUTINE W REFLEX MICROSCOPIC
Bacteria, UA: NONE SEEN
Bilirubin Urine: NEGATIVE
Glucose, UA: NEGATIVE mg/dL
Hgb urine dipstick: NEGATIVE
Ketones, ur: NEGATIVE mg/dL
Nitrite: POSITIVE — AB
Protein, ur: NEGATIVE mg/dL
Specific Gravity, Urine: 1.014 (ref 1.005–1.030)
pH: 5 (ref 5.0–8.0)

## 2023-03-01 LAB — COMPREHENSIVE METABOLIC PANEL
ALT: 10 U/L (ref 0–44)
AST: 15 U/L (ref 15–41)
Albumin: 3.5 g/dL (ref 3.5–5.0)
Alkaline Phosphatase: 73 U/L (ref 38–126)
Anion gap: 8 (ref 5–15)
BUN: 18 mg/dL (ref 8–23)
CO2: 32 mmol/L (ref 22–32)
Calcium: 9.5 mg/dL (ref 8.9–10.3)
Chloride: 95 mmol/L — ABNORMAL LOW (ref 98–111)
Creatinine, Ser: 0.95 mg/dL (ref 0.44–1.00)
GFR, Estimated: 58 mL/min — ABNORMAL LOW (ref >60)
Glucose, Bld: 96 mg/dL (ref 70–99)
Potassium: 4.5 mmol/L (ref 3.5–5.1)
Sodium: 135 mmol/L (ref 135–145)
Total Bilirubin: 0.8 mg/dL (ref 0.0–1.2)
Total Protein: 7.1 g/dL (ref 6.5–8.1)

## 2023-03-01 LAB — RAPID URINE DRUG SCREEN, HOSP PERFORMED
Amphetamines: NOT DETECTED
Barbiturates: NOT DETECTED
Benzodiazepines: POSITIVE — AB
Cocaine: NOT DETECTED
Opiates: POSITIVE — AB
Tetrahydrocannabinol: NOT DETECTED

## 2023-03-01 LAB — CBC WITH DIFFERENTIAL/PLATELET
Abs Immature Granulocytes: 0.04 10*3/uL (ref 0.00–0.07)
Basophils Absolute: 0 10*3/uL (ref 0.0–0.1)
Basophils Relative: 0 %
Eosinophils Absolute: 0.6 10*3/uL — ABNORMAL HIGH (ref 0.0–0.5)
Eosinophils Relative: 7 %
HCT: 39.9 % (ref 36.0–46.0)
Hemoglobin: 12.8 g/dL (ref 12.0–15.0)
Immature Granulocytes: 0 %
Lymphocytes Relative: 26 %
Lymphs Abs: 2.4 10*3/uL (ref 0.7–4.0)
MCH: 29.6 pg (ref 26.0–34.0)
MCHC: 32.1 g/dL (ref 30.0–36.0)
MCV: 92.1 fL (ref 80.0–100.0)
Monocytes Absolute: 0.8 10*3/uL (ref 0.1–1.0)
Monocytes Relative: 9 %
Neutro Abs: 5.2 10*3/uL (ref 1.7–7.7)
Neutrophils Relative %: 58 %
Platelets: 159 10*3/uL (ref 150–400)
RBC: 4.33 MIL/uL (ref 3.87–5.11)
RDW: 16 % — ABNORMAL HIGH (ref 11.5–15.5)
WBC: 9 10*3/uL (ref 4.0–10.5)
nRBC: 0 % (ref 0.0–0.2)

## 2023-03-01 LAB — BLOOD GAS, VENOUS
Acid-Base Excess: 7.2 mmol/L — ABNORMAL HIGH (ref 0.0–2.0)
Bicarbonate: 36.1 mmol/L — ABNORMAL HIGH (ref 20.0–28.0)
Drawn by: 71987
O2 Saturation: 94.9 %
Patient temperature: 36.4
pCO2, Ven: 73 mmHg (ref 44–60)
pH, Ven: 7.3 (ref 7.25–7.43)
pO2, Ven: 69 mmHg — ABNORMAL HIGH (ref 32–45)

## 2023-03-01 LAB — TSH: TSH: 4.007 u[IU]/mL (ref 0.350–4.500)

## 2023-03-01 LAB — RESP PANEL BY RT-PCR (RSV, FLU A&B, COVID)  RVPGX2
Influenza A by PCR: NEGATIVE
Influenza B by PCR: NEGATIVE
Resp Syncytial Virus by PCR: NEGATIVE
SARS Coronavirus 2 by RT PCR: NEGATIVE

## 2023-03-01 LAB — CK: Total CK: 22 U/L — ABNORMAL LOW (ref 38–234)

## 2023-03-01 LAB — LACTIC ACID, PLASMA
Lactic Acid, Venous: 0.8 mmol/L (ref 0.5–1.9)
Lactic Acid, Venous: 0.6 mmol/L (ref 0.5–1.9)

## 2023-03-01 LAB — STREP PNEUMONIAE URINARY ANTIGEN: Strep Pneumo Urinary Antigen: NEGATIVE

## 2023-03-01 LAB — CBG MONITORING, ED: Glucose-Capillary: 97 mg/dL (ref 70–99)

## 2023-03-01 MED ORDER — LACTATED RINGERS IV SOLN: INTRAVENOUS | Status: DC

## 2023-03-01 MED ORDER — IOHEXOL 300 MG/ML  SOLN
75.0000 mL | Freq: Once | INTRAMUSCULAR | Status: AC | PRN
  Administered 2023-03-01: 75 mL via INTRAVENOUS

## 2023-03-01 MED ORDER — HYDRALAZINE HCL 20 MG/ML IJ SOLN: 5.0000 mg | INTRAMUSCULAR | Status: DC | PRN

## 2023-03-01 MED ORDER — HEPARIN SODIUM (PORCINE) 5000 UNIT/ML IJ SOLN
5000.0000 [IU] | Freq: Three times a day (TID) | INTRAMUSCULAR | Status: DC
  Administered 2023-03-02 – 2023-03-03 (×4): 5000 [IU] via SUBCUTANEOUS
  Filled 2023-03-01 (×4): qty 1

## 2023-03-01 MED ORDER — IPRATROPIUM-ALBUTEROL 0.5-2.5 (3) MG/3ML IN SOLN: 3.0000 mL | RESPIRATORY_TRACT | Status: DC | PRN

## 2023-03-01 MED ORDER — ONDANSETRON HCL 4 MG PO TABS: 4.0000 mg | ORAL_TABLET | Freq: Four times a day (QID) | ORAL | Status: DC | PRN

## 2023-03-01 MED ORDER — VANCOMYCIN HCL IN DEXTROSE 1-5 GM/200ML-% IV SOLN: 1000.0000 mg | Freq: Once | INTRAVENOUS | Status: DC

## 2023-03-01 MED ORDER — NYSTATIN 100000 UNIT/GM EX POWD
Freq: Two times a day (BID) | CUTANEOUS | Status: DC
  Filled 2023-03-01 (×3): qty 15

## 2023-03-01 MED ORDER — SODIUM CHLORIDE 0.9 % IV SOLN
2.0000 g | Freq: Two times a day (BID) | INTRAVENOUS | Status: DC
  Administered 2023-03-01 – 2023-03-03 (×4): 2 g via INTRAVENOUS
  Filled 2023-03-01 (×5): qty 12.5

## 2023-03-01 MED ORDER — CHLORHEXIDINE GLUCONATE CLOTH 2 % EX PADS
6.0000 | MEDICATED_PAD | Freq: Every day | CUTANEOUS | Status: DC
  Administered 2023-03-02: 6 via TOPICAL

## 2023-03-01 MED ORDER — SODIUM CHLORIDE 0.9 % IV SOLN
1.0000 g | Freq: Once | INTRAVENOUS | Status: AC
  Administered 2023-03-01: 1 g via INTRAVENOUS
  Filled 2023-03-01: qty 10

## 2023-03-01 MED ORDER — LATANOPROST 0.005 % OP SOLN
1.0000 [drp] | Freq: Every day | OPHTHALMIC | Status: DC
  Administered 2023-03-01: 1 [drp] via OPHTHALMIC
  Filled 2023-03-01: qty 2.5

## 2023-03-01 MED ORDER — GUAIFENESIN-DM 100-10 MG/5ML PO SYRP
5.0000 mL | ORAL_SOLUTION | ORAL | Status: DC | PRN
  Administered 2023-03-01 – 2023-03-02 (×3): 5 mL via ORAL
  Filled 2023-03-01 (×3): qty 5

## 2023-03-01 MED ORDER — LEVETIRACETAM IN NACL 500 MG/100ML IV SOLN
500.0000 mg | Freq: Two times a day (BID) | INTRAVENOUS | Status: DC
  Administered 2023-03-01 – 2023-03-02 (×2): 500 mg via INTRAVENOUS
  Filled 2023-03-01 (×2): qty 100

## 2023-03-01 MED ORDER — LACTATED RINGERS IV BOLUS
500.0000 mL | Freq: Once | INTRAVENOUS | Status: AC
  Administered 2023-03-01: 500 mL via INTRAVENOUS

## 2023-03-01 MED ORDER — ALBUTEROL SULFATE (2.5 MG/3ML) 0.083% IN NEBU: 2.5000 mg | INHALATION_SOLUTION | RESPIRATORY_TRACT | Status: DC | PRN

## 2023-03-01 MED ORDER — VANCOMYCIN HCL 1250 MG/250ML IV SOLN
1250.0000 mg | Freq: Once | INTRAVENOUS | Status: DC
Start: 2023-03-03 — End: 2023-03-02

## 2023-03-01 MED ORDER — SODIUM CHLORIDE 0.9 % IV SOLN: 500.0000 mg | INTRAVENOUS | Status: DC

## 2023-03-01 MED ORDER — SODIUM CHLORIDE 0.9 % IV SOLN: 1.0000 g | Freq: Once | INTRAVENOUS | Status: DC

## 2023-03-01 MED ORDER — IPRATROPIUM-ALBUTEROL 0.5-2.5 (3) MG/3ML IN SOLN
3.0000 mL | RESPIRATORY_TRACT | Status: DC
  Administered 2023-03-01: 3 mL via RESPIRATORY_TRACT
  Filled 2023-03-01: qty 3

## 2023-03-01 MED ORDER — METRONIDAZOLE 500 MG/100ML IV SOLN
500.0000 mg | Freq: Two times a day (BID) | INTRAVENOUS | Status: DC
  Administered 2023-03-01 – 2023-03-03 (×4): 500 mg via INTRAVENOUS
  Filled 2023-03-01 (×4): qty 100

## 2023-03-01 MED ORDER — ONDANSETRON HCL 4 MG/2ML IJ SOLN: 4.0000 mg | Freq: Four times a day (QID) | INTRAMUSCULAR | Status: DC | PRN

## 2023-03-01 MED ORDER — METHYLPREDNISOLONE SODIUM SUCC 125 MG IJ SOLR
125.0000 mg | Freq: Every day | INTRAMUSCULAR | Status: DC
  Administered 2023-03-01 – 2023-03-02 (×2): 125 mg via INTRAVENOUS
  Filled 2023-03-01 (×2): qty 2

## 2023-03-01 MED ORDER — VANCOMYCIN HCL 1250 MG/250ML IV SOLN
1250.0000 mg | Freq: Once | INTRAVENOUS | Status: AC
  Administered 2023-03-01: 1250 mg via INTRAVENOUS
  Filled 2023-03-01: qty 250

## 2023-03-01 NOTE — ED Provider Notes (Signed)
 [place] EMERGENCY DEPARTMENT AT [facility] Provider Note   CSN: [id number] Arrival date & time: 03/01/23  0957     History  Chief Complaint  Patient presents with   Altered Mental Status    Beverly [name] is a 89 y.o. female. She has PMH of STEMI, Cardiac arrest, COPD, coronary artery disease, subdural hematoma, hypertension, seizures. He is brought to the ER today for evaluation of generalized weakness and decreased mental status.  This started about 2 weeks ago but was initially mild.  Family states last week they took a urine sample to her PCP because she has had this issue with prior UTIs but they got a call back but she did not have a UTI.  Over the weekend specifically over the past 24 hours she has had significant decline.  Patient normally ambulates with a walker, pivots with assistance, dresses herself while sitting down but cannot do any of these things now.  She had to have extensive assistance getting into the car today.  Patient reports her only pain is there is of her neck which is chronic from arthritis.  She denies any complaints at this time.  Family reports they checked her pulse ox yesterday and she was saturating in the 80s.  She is not on home oxygen.  They also report that she had intermittently productive cough last week.    Weakness      Home Medications Prior to Admission medications   Medication Sig Start Date End Date Taking? Authorizing Provider  acetaminophen (TYLENOL) 500 MG tablet Take 500 mg by mouth every 4 (four) hours.   Yes [provider]  ALPRAZolam Prudy Feeler) 0.5 MG tablet Take 1 tablet (0.5 mg total) by mouth at bedtime as needed for anxiety. Patient taking differently: Take 0.5 mg by mouth 2 (two) times daily as needed for anxiety or sleep. 08/03/22  Yes Sharee Holster, NP  amLODipine (NORVASC) 10 MG tablet Take 1 tablet (10 mg total) by mouth daily. 08/03/22  Yes Sharee Holster, NP  aspirin EC 81 MG tablet Take 1 tablet  (81 mg total) by mouth daily with breakfast. 07/18/22 07/18/23 Yes Emokpae, Courage, MD  atorvastatin (LIPITOR) 80 MG tablet Take 1 tablet (80 mg total) by mouth daily at 6 PM. 08/03/22  Yes Green, Chong Sicilian, NP  carvedilol (COREG) 12.5 MG tablet Take 1 tablet (12.5 mg total) by mouth 2 (two) times daily. 08/03/22  Yes Sharee Holster, NP  celecoxib (CELEBREX) 200 MG capsule Take 1 capsule (200 mg total) by mouth 2 (two) times daily. 08/03/22  Yes Sharee Holster, NP  Cholecalciferol 25 MCG (1000 UT) TBDP Take 1,000 Units by mouth daily.   Yes [provider]  famotidine (PEPCID) 20 MG tablet Take 20 mg by mouth daily. 06/30/22  Yes [provider]  fluticasone (FLONASE) 50 MCG/ACT nasal spray Place 2 sprays into both nostrils daily.   Yes [provider]  gabapentin (NEURONTIN) 100 MG capsule Take 1 capsule (100 mg total) by mouth at bedtime. Patient taking differently: Take 200 mg by mouth at bedtime. 08/03/22  Yes Sharee Holster, NP  guaiFENesin (MUCINEX) 600 MG 12 hr tablet Take 1 tablet (600 mg total) by mouth 2 (two) times daily. 07/18/22 07/18/23 Yes Emokpae, Courage, MD  guaifenesin (ROBITUSSIN) 100 MG/5ML syrup Take 100 mg by mouth 3 (three) times daily as needed for cough.   Yes [provider]  HYDROcodone-acetaminophen (NORCO) 10-325 MG tablet Take 1 tablet by  mouth at bedtime. Patient taking differently: Take 1 tablet by mouth every 4 (four) hours as needed for moderate pain (pain score 4-6). 08/03/22  Yes Sharee Holster, NP  latanoprost (XALATAN) 0.005 % ophthalmic solution INSTILL ONE DROP IN BOTH EYES NIGHTLY 08/03/22  Yes Sharee Holster, NP  levETIRAcetam (KEPPRA) 500 MG tablet Take 1 tablet (500 mg total) by mouth 2 (two) times daily. 08/03/22  Yes Sharee Holster, NP  memantine (NAMENDA) 5 MG tablet Take 1 tablet (5 mg total) by mouth 2 (two) times daily. 08/03/22  Yes Sharee Holster, NP  methocarbamol (ROBAXIN) 500 MG tablet Take 1 tablet (500 mg  total) by mouth 4 (four) times daily. 08/03/22  Yes Sharee Holster, NP  Misc Natural Products (COLON CLEANSE PO) Take 1 capsule by mouth daily as needed (Constipation).   Yes [provider]  nitroGLYCERIN (NITROSTAT) 0.4 MG SL tablet Place 1 tablet (0.4 mg total) under the tongue every 5 (five) minutes x 3 doses as needed for chest pain. 08/03/22  Yes Sharee Holster, NP  Nutritional Supplements (ENSURE ENLIVE PO) Take 237 mLs by mouth daily.   Yes [provider]  [facility] powder Apply 1 Application topically 2 (two) times daily as needed (Irritation). 07/18/22  Yes Emokpae, Courage, MD  pantoprazole (PROTONIX) 40 MG tablet Take 1 tablet (40 mg total) by mouth daily. 08/03/22  Yes Sharee Holster, NP  Propylene Glycol (SYSTANE BALANCE OP) Place 2 drops into both eyes daily as needed (dry eyes).   Yes [provider]  risperiDONE (RISPERDAL) 0.5 MG tablet Take 1 tablet (0.5 mg total) by mouth 2 (two) times daily. 08/03/22  Yes Sharee Holster, NP  sertraline (ZOLOFT) 50 MG tablet Take 1 tablet (50 mg total) by mouth daily. 08/03/22  Yes Sharee Holster, NP  UNABLE TO FIND Med Name: Marchia Bond [provider]  lisinopril (ZESTRIL) 20 MG tablet Take 1 tablet (20 mg total) by mouth 2 (two) times daily. 01/24/19 05/03/19  Ellsworth Lennox, PA-C      Allergies    Lisinopril, Egg-derived products, Morphine and codeine, and Penicillins    Review of Systems   Review of Systems  Neurological:  Positive for weakness.    Physical Exam Updated Vital Signs BP 124/64   Pulse 66   Temp (!) 96.1 F (35.6 C)   Resp 13   Ht 5' (1.524 m)   Wt 65.6 kg   SpO2 96%   BMI 28.24 kg/m  Physical Exam Vitals and nursing note reviewed.  Constitutional:      General: She is not in acute distress.    Appearance: She is well-developed.     Comments: Sleeping but wakes up easily with touch, able to answer questions  HENT:     Head: Normocephalic and atraumatic.      Mouth/Throat:     Mouth: Mucous membranes are moist.  Eyes:     Conjunctiva/sclera: Conjunctivae normal.  Cardiovascular:     Rate and Rhythm: Normal rate and regular rhythm.     Heart sounds: No murmur heard. Pulmonary:     Effort: Pulmonary effort is normal. No respiratory distress.     Breath sounds: Normal breath sounds.  Abdominal:     Palpations: Abdomen is soft.     Tenderness: There is no abdominal tenderness.  Musculoskeletal:        General: No swelling.     Cervical back: Neck supple.  Skin:  General: Skin is warm and dry.     Capillary Refill: Capillary refill takes less than 2 seconds.  Neurological:     General: No focal deficit present.     Mental Status: She is oriented to person, place, and time.  Psychiatric:        Mood and Affect: Mood normal.     ED Results / Procedures / Treatments   Labs (all labs ordered are listed, but only abnormal results are displayed) Labs Reviewed  CBC WITH DIFFERENTIAL/PLATELET - Abnormal; Notable for the following components:      Result Value   RDW 16.0 (*)    Eosinophils Absolute 0.6 (*)    All other components within normal limits  COMPREHENSIVE METABOLIC PANEL - Abnormal; Notable for the following components:   Chloride 95 (*)    GFR, Estimated 58 (*)    All other components within normal limits  CK - Abnormal; Notable for the following components:   Total CK 22 (*)    All other components within normal limits  URINALYSIS, ROUTINE W REFLEX MICROSCOPIC - Abnormal; Notable for the following components:   APPearance HAZY (*)    Nitrite POSITIVE (*)    Leukocytes,Ua SMALL (*)    All other components within normal limits  RAPID URINE DRUG SCREEN, HOSP PERFORMED - Abnormal; Notable for the following components:   Opiates POSITIVE (*)    Benzodiazepines POSITIVE (*)    All other components within normal limits  BLOOD GAS, VENOUS - Abnormal; Notable for the following components:   pCO2, Ven 73 (*)    pO2, Ven 69 (*)     Bicarbonate 36.1 (*)    Acid-Base Excess 7.2 (*)    All other components within normal limits  RESP PANEL BY RT-PCR (RSV, FLU A&B, COVID)  RVPGX2  CULTURE, BLOOD (ROUTINE X 2)  CULTURE, BLOOD (ROUTINE X 2)  EXPECTORATED SPUTUM ASSESSMENT W GRAM STAIN, RFLX TO RESP C  BRAIN NATRIURETIC PEPTIDE  TSH  LACTIC ACID, PLASMA  LACTIC ACID, PLASMA  STREP PNEUMONIAE URINARY ANTIGEN  BASIC METABOLIC PANEL  CBC  LEGIONELLA PNEUMOPHILA SEROGP 1 UR AG  CBG MONITORING, ED    EKG None  Radiology CT Chest W Contrast Result Date: 03/01/2023 CLINICAL DATA:  Respiratory illness with nondiagnostic x-ray. EXAM: CT CHEST WITH CONTRAST TECHNIQUE: Multidetector CT imaging of the chest was performed during intravenous contrast administration. RADIATION DOSE REDUCTION: This exam was performed according to the departmental dose-optimization program which includes automated exposure control, adjustment of the mA and/or kV according to patient size and/or use of iterative reconstruction technique. CONTRAST:  75mL OMNIPAQUE IOHEXOL 300 MG/ML  SOLN COMPARISON:  Chest radiograph 03/01/2023.  CT chest 05/09/2020 FINDINGS: Cardiovascular: Cardiac enlargement. No pericardial effusions. Normal caliber thoracic aorta. Scattered calcification. No dissection. Although the contrast bolus is limited for evaluation of pulmonary arteries. No large central pulmonary embolus is identified. Mediastinum/Nodes: Thyroid gland is unremarkable. Esophagus is decompressed. There is a large esophageal hiatal hernia containing much of the stomach. Mediastinal lymphadenopathy. Largest subcarinal nodes measure up to 1.4 cm short axis dimension. This is similar to the prior study, likely representing reactive change. Lungs/Pleura: Motion artifact limits evaluation. There is consolidation or atelectasis in both lung bases. There is diffuse interstitial infiltration with patchy airspace disease in both lungs. This appearance is most likely represent  multifocal pneumonia or edema. Peribronchial thickening with some mucous plugging suggesting acute or chronic bronchitis. Aspiration would be a secondary consideration. No pleural effusion or pneumothorax. Upper Abdomen: No  acute abnormalities. Musculoskeletal: Degenerative changes in the spine. Old fracture deformity of the sternum. No acute bony abnormalities are identified. IMPRESSION: 1. Cardiac enlargement. 2. Diffuse airspace and interstitial disease in the lungs with atelectasis or consolidation in the lung bases and diffuse bronchitic changes. Appearances are most likely to represent multifocal pneumonia or edema. Aspiration could also be a consideration in the appropriate clinical setting. 3. Large esophageal hiatal hernia. 4. Aortic atherosclerosis. Electronically Signed   By: Burman Nieves M.D.   On: 03/01/2023 15:56   DG Chest Port 1 View Result Date: 03/01/2023 CLINICAL DATA:  Cough and weakness. EXAM: PORTABLE CHEST 1 VIEW COMPARISON:  Chest radiograph dated 07/14/2022. FINDINGS: Patient is rotated to the left. The heart is borderline enlarged. Mediastinal contours are within normal limits. Moderate hiatal hernia. Similar chronic elevation of the right hemidiaphragm. Indistinctness of the left costophrenic angle with suspected left basilar atelectasis. Diffuse bilateral interstitial prominence and coarsening. No acute osseous abnormality. IMPRESSION: 1. Limited examination demonstrating suspected small left pleural effusion with left basilar subsegmental atelectasis. 2. Diffuse interstitial prominence and coarsening could reflect chronic changes, edema, or atypical infection. Electronically Signed   By: Hart Robinsons M.D.   On: 03/01/2023 12:15   CT Head Wo Contrast Result Date: 03/01/2023 CLINICAL DATA:  Altered mental status, weakness for past couple of days. EXAM: CT HEAD WITHOUT CONTRAST TECHNIQUE: Contiguous axial images were obtained from the base of the skull through the vertex  without intravenous contrast. RADIATION DOSE REDUCTION: This exam was performed according to the departmental dose-optimization program which includes automated exposure control, adjustment of the mA and/or kV according to patient size and/or use of iterative reconstruction technique. COMPARISON:  CT head 07/15/2022 FINDINGS: Brain: No acute intracranial hemorrhage. No CT evidence of acute infarct. Nonspecific hypoattenuation in the periventricular and subcortical white matter favored to reflect chronic microvascular ischemic changes. Remote lacunar infarcts in the basal ganglia. No edema, mass effect, or midline shift. The basilar cisterns are patent. Ventricles: Prominence of the ventricles suggesting underlying parenchymal volume loss. Vascular: Atherosclerosis of the carotid siphons. No hyperdense vessel. Skull: Postsurgical changes of left frontal craniotomy. No acute or aggressive finding. Orbits: Right lens replacement.  Orbits otherwise symmetric. Sinuses: The visualized paranasal sinuses are clear. Other: Mastoid air cells are clear. IMPRESSION: No CT evidence of acute intracranial abnormality. Mild chronic microvascular ischemic changes and parenchymal volume loss. Remote lacunar infarcts in the basal ganglia. Postsurgical changes of left frontal craniotomy. Electronically Signed   By: Emily Filbert M.D.   On: 03/01/2023 11:46    Procedures Procedures    Medications Ordered in ED Medications  nystatin (MYCOSTATIN/NYSTOP) topical powder ( Topical Given 03/01/23 1738)  metroNIDAZOLE (FLAGYL) IVPB 500 mg (0 mg Intravenous Stopped 03/01/23 1821)  vancomycin (VANCOREADY) IVPB 1250 mg/250 mL ( Intravenous Infusion Verify 03/01/23 1901)  lactated ringers infusion ( Intravenous Rate/Dose Change 03/01/23 1823)  latanoprost (XALATAN) 0.005 % ophthalmic solution 1 drop (has no administration in time range)  heparin injection 5,000 Units (has no administration in time range)  ondansetron (ZOFRAN) tablet 4  mg (has no administration in time range)    Or  ondansetron (ZOFRAN) injection 4 mg (has no administration in time range)  albuterol (PROVENTIL) (2.5 MG/3ML) 0.083% nebulizer solution 2.5 mg (has no administration in time range)  hydrALAZINE (APRESOLINE) injection 5 mg (has no administration in time range)  vancomycin (VANCOREADY) IVPB 1250 mg/250 mL (has no administration in time range)  ceFEPIme (MAXIPIME) 2 g in sodium chloride 0.9 % 100 mL  IVPB (has no administration in time range)  levETIRAcetam (KEPPRA) IVPB 500 mg/100 mL premix (has no administration in time range)  ipratropium-albuterol (DUONEB) 0.5-2.5 (3) MG/3ML nebulizer solution 3 mL (has no administration in time range)  methylPREDNISolone sodium succinate (SOLU-MEDROL) 125 mg/2 mL injection 125 mg (has no administration in time range)  lactated ringers bolus 500 mL (0 mLs Intravenous Stopped 03/01/23 1245)  cefTRIAXone (ROCEPHIN) 1 g in sodium chloride 0.9 % 100 mL IVPB (0 g Intravenous Stopped 03/01/23 1513)  iohexol (OMNIPAQUE) 300 MG/ML solution 75 mL (75 mLs Intravenous Contrast Given 03/01/23 1423)    ED Course/ Medical Decision Making/ A&P                                 Medical Decision Making This patient presents to the ED for concern of decreased mental status, this involves an extensive number of treatment options, and is a complaint that carries with it a high risk of complications and morbidity.  The differential diagnosis includes sepsis, metabolic encephalopathy, hepatic encephalopathy,   Co morbidities that complicate the patient evaluation :   High cholesterol, CAD, COPD   Additional history obtained:  Additional history obtained from EMR External records from outside source obtained and reviewed including notes, prior labs and imaging   Lab Tests:  I Ordered, and personally interpreted labs.  The pertinent results include: Lactic acid normal, CK normal, BNP normal, CMP shows mild hypochloremia otherwise  normal, CBC with no leukocytosis, no anemia, normal platelets, negative COVID flu and RSV, UA shows positive nitrate, small leuks, 20-50 white blood cells no bacteria, urine drug screen positive for opiates and benzodiazepines   Imaging Studies ordered:  I ordered imaging studies including CT head which shows no acute intracranial hemorrhage, no acute stroke; chest x-ray shows small left pleural effusion, small left basilar atelectasis I independently visualized and interpreted imaging within scope of identifying emergent findings  I agree with the radiologist interpretation   Cardiac Monitoring: / EKG:  The patient was maintained on a cardiac monitor.  I personally viewed and interpreted the cardiac monitored which showed an underlying rhythm of: Sinus rhythm   Consultations Obtained:  I requested consultation with the hospitalist, Dr. Randol Kern,  and discussed lab and imaging findings as well as pertinent plan - they recommend: Admission   Problem List / ED Course / Critical interventions / Medication management  Altered mental status-patient having worsening weakness and decreased mental status over couple of weeks but much worse over the past day, family states she often aspirates on her coffee in the morning and on her food and she has had prior aspiration pneumonia so they are worried it is either this or a UTI which has caused her to be confused in the past as well.  Had normal UA recently and was not complaining of any dysuria, she does have pyuria so was given Rocephin, noted to be hypothermic but no other sepsis criteria met.  She is not hypotensive, not tachycardic, not tachypneic, she does have new oxygen requirement, was around 86% on room air per nurse who put her on 2 L of nasal cannula.  She has been very somnolent but will wake up, answer questions, she able to tell me she is in the hospital, given her correct age, name and discussing her chronic neck pain but she denied any  other complaints.  She was put on Bair hugger to increase her temperature, given  Rocephin for possible UTI, chest x-ray did not show significant findings but CT ordered due to her oxygen requirement, this showed patchy bilateral infiltrates concerning for multifocal pneumonia versus aspiration pneumonia.  Given history of aspiration, will admit patient to hospital, discussed with Dr. Randol Kern who started her on Flagyl as well for possible aspiration pneumonia.  Given her hypothermia and decreased mental status she will go to stepdown.  I have reviewed the patients home medicines and have made adjustments as needed      Amount and/or Complexity of Data Reviewed Labs: ordered. Radiology: ordered.  Risk Prescription drug management. Decision regarding hospitalization.           Final Clinical Impression(s) / ED Diagnoses Final diagnoses:  Pneumonia of both lungs due to infectious organism, unspecified part of lung  Hypothermia, initial encounter    Rx / DC Orders ED Discharge Orders          Ordered    Urine Culture        03/01/23 1106    Lactic Acid, Plasma  Status:  Canceled        03/01/23 [address] 03/01/23 1909    Glendora Score, MD 03/02/23 1220

## 2023-03-01 NOTE — H&P (Signed)
 [facility] H&P   Patient Demographics:    Beverly [name], is a 89 y.o. female  MRN: [id number]   DOB - Aug 01, 1932  Admit Date - 03/01/2023  Outpatient Primary MD for the patient is Benita Stabile, MD  Referring MD/NP/PA: PA Celeste   Patient coming from: Home  Chief Complaint  Patient presents with   Altered Mental Status      HPI:    Beverly [name]  is a 89 y.o. female,with medical history significant for STEMI, Cardiac arrest, COPD, coronary artery disease, subdural hematoma, hypertension, seizures. -Patient presents to ED due to progressive weakness, patient is currently somnolent, family assisting with the history, daughter and granddaughter at the bedside, patient with increased confusion, increased lethargy, more confused, this is progressive over the last 2 weeks, to PCP last week, as this resembles previous UTI infections, where she did not have UTI, over last 24 hours she had significant decline, usually ambulates with a walker, but was more lethargic, very weak, unable to stand to get into the car today which prompted them to bring her to ED, of note patient on multiple sedative medications, please see discussion below -In ED CT head with no acute finding, CT chest with concern for pneumonia, her UA is positive as well, she was found to be hypothermic, respiratory panel was negative, urine drug screen positive for opiates and benzodiazepine which she is on her home regimen, [facility] consulted to admit.    Review of systems:    Patient is encephalopathic, unable to provide reliable review of systems  With Past History of the following :    Past Medical History:  Diagnosis Date   Anxiety    Arthritis    COPD (chronic obstructive pulmonary disease) (HCC)    Coronary artery disease    a. STEMI with cardiac arrest (polymorphic VT) s/p DES to RCA.   GERD (gastroesophageal  reflux disease)    Headache(784.0)    Hearing loss, central    Left ear   Hematoma    Hypertension    Memory changes    Mild aortic stenosis    Mild carotid artery disease (HCC)    a. 1-39% by duplex 02/2018.   Seizures (HCC) 10/2012   No seizures, speech issues; after a fall, hitting head on left side   UTI (urinary tract infection)    June 2022      Past Surgical History:  Procedure Laterality Date   ABDOMINAL HYSTERECTOMY     APPENDECTOMY     CARDIAC CATHETERIZATION     15 yrs ago   CORONARY/GRAFT ACUTE MI REVASCULARIZATION N/A 01/10/2018   Procedure: Coronary/Graft Acute MI Revascularization;  Surgeon: Runell Gess, MD;  Location: [facility];  Service: Cardiovascular;  Laterality: N/A;   CRANIOTOMY Left 10/30/2012   Procedure: CRANIOTOMY HEMATOMA EVACUATION SUBDURAL;  Surgeon: Maeola Harman, MD;  Location: [facility];  Service: Neurosurgery;  Laterality: Left;  Left Craniotomy for evacuation of subdural hematoma   JOINT REPLACEMENT Bilateral    knees   LEFT HEART CATH AND CORONARY ANGIOGRAPHY N/A 01/10/2018   Procedure: LEFT HEART CATH AND CORONARY ANGIOGRAPHY;  Surgeon: Runell Gess, MD;  Location: [facility];  Service: Cardiovascular;  Laterality: N/A;      Social History:     Social History   Tobacco Use   Smoking status: Never   Smokeless tobacco: Never  Substance Use Topics   Alcohol use: No     Lives -lives at home with family  Mobility -ambulates with a walker   Family History :     Family History  Problem Relation Age of Onset   Dementia Mother    Cancer Father    Dementia Maternal Aunt    Ataxia Neg Hx    Chorea Neg Hx    Mental retardation Neg Hx    Migraines Neg Hx    Multiple sclerosis Neg Hx    Neurofibromatosis Neg Hx    Neuropathy Neg Hx    Parkinsonism Neg Hx    Seizures Neg Hx    Stroke Neg Hx       Home Medications:   Prior to Admission medications   Medication Sig Start Date End Date Taking?  Authorizing Provider  acetaminophen (TYLENOL) 500 MG tablet Take 500 mg by mouth every 4 (four) hours.   Yes [provider]  ALPRAZolam Prudy Feeler) 0.5 MG tablet Take 1 tablet (0.5 mg total) by mouth at bedtime as needed for anxiety. Patient taking differently: Take 0.5 mg by mouth 2 (two) times daily as needed for anxiety or sleep. 08/03/22  Yes Sharee Holster, NP  amLODipine (NORVASC) 10 MG tablet Take 1 tablet (10 mg total) by mouth daily. 08/03/22  Yes Sharee Holster, NP  aspirin EC 81 MG tablet Take 1 tablet (81 mg total) by mouth daily with breakfast. 07/18/22 07/18/23 Yes Emokpae, Courage, MD  atorvastatin (LIPITOR) 80 MG tablet Take 1 tablet (80 mg total) by mouth daily at 6 PM. 08/03/22  Yes Green, Chong Sicilian, NP  carvedilol (COREG) 12.5 MG tablet Take 1 tablet (12.5 mg total) by mouth 2 (two) times daily. 08/03/22  Yes Sharee Holster, NP  celecoxib (CELEBREX) 200 MG capsule Take 1 capsule (200 mg total) by mouth 2 (two) times daily. 08/03/22  Yes Sharee Holster, NP  Cholecalciferol 25 MCG (1000 UT) TBDP Take 1,000 Units by mouth daily.   Yes [provider]  famotidine (PEPCID) 20 MG tablet Take 20 mg by mouth daily. 06/30/22  Yes [provider]  fluticasone (FLONASE) 50 MCG/ACT nasal spray Place 2 sprays into both nostrils daily.   Yes [provider]  gabapentin (NEURONTIN) 100 MG capsule Take 1 capsule (100 mg total) by mouth at bedtime. Patient taking differently: Take 200 mg by mouth at bedtime. 08/03/22  Yes Sharee Holster, NP  guaiFENesin (MUCINEX) 600 MG 12 hr tablet Take 1 tablet (600 mg total) by mouth 2 (two) times daily. 07/18/22 07/18/23 Yes Emokpae, Courage, MD  guaifenesin (ROBITUSSIN) 100 MG/5ML syrup Take 100 mg by mouth 3 (three) times daily as needed for cough.   Yes [provider]  HYDROcodone-acetaminophen (NORCO) 10-325 MG tablet Take 1 tablet by mouth at bedtime. Patient taking differently: Take 1 tablet by mouth every 4  (four) hours as needed for moderate pain (pain score 4-6). 08/03/22  Yes Sharee Holster, NP  latanoprost (XALATAN) 0.005 % ophthalmic solution INSTILL ONE DROP IN BOTH EYES NIGHTLY 08/03/22  Yes Sharee Holster, NP  levETIRAcetam (KEPPRA) 500 MG tablet Take 1 tablet (500 mg total) by mouth 2 (two) times daily. 08/03/22  Yes Sharee Holster, NP  memantine (NAMENDA) 5 MG tablet Take 1 tablet (5 mg total) by mouth 2 (two) times daily. 08/03/22  Yes Sharee Holster, NP  methocarbamol (ROBAXIN) 500 MG tablet Take 1 tablet (500 mg total) by mouth 4 (four) times daily. 08/03/22  Yes Sharee Holster, NP  Misc Natural Products (COLON CLEANSE PO) Take 1 capsule by mouth daily as needed (Constipation).   Yes [provider]  nitroGLYCERIN (NITROSTAT) 0.4 MG SL tablet Place 1 tablet (0.4 mg total) under the tongue every 5 (five) minutes x 3 doses as needed for chest pain. 08/03/22  Yes Sharee Holster, NP  Nutritional Supplements (ENSURE ENLIVE PO) Take 237 mLs by mouth daily.   Yes [provider]  [facility] powder Apply 1 Application topically 2 (two) times daily as needed (Irritation). 07/18/22  Yes Emokpae, Courage, MD  pantoprazole (PROTONIX) 40 MG tablet Take 1 tablet (40 mg total) by mouth daily. 08/03/22  Yes Sharee Holster, NP  Propylene Glycol (SYSTANE BALANCE OP) Place 2 drops into both eyes daily as needed (dry eyes).   Yes [provider]  risperiDONE (RISPERDAL) 0.5 MG tablet Take 1 tablet (0.5 mg total) by mouth 2 (two) times daily. 08/03/22  Yes Sharee Holster, NP  sertraline (ZOLOFT) 50 MG tablet Take 1 tablet (50 mg total) by mouth daily. 08/03/22  Yes Sharee Holster, NP  UNABLE TO FIND Med Name: Marchia Bond [provider]  lisinopril (ZESTRIL) 20 MG tablet Take 1 tablet (20 mg total) by mouth 2 (two) times daily. 01/24/19 05/03/19  Ellsworth Lennox, PA-C     Allergies:     Allergies  Allergen Reactions   Lisinopril Swelling   Egg-Derived Products  Itching    Patient reports that she can eat eggs but does not tolerate egg derived products   Morphine And Codeine     Headache   Penicillins Itching          Physical Exam:   Vitals  Blood pressure 123/60, pulse 64, temperature (!) 96.1 F (35.6 C), resp. rate 12, height 5' (1.524 m), weight 65.6 kg, SpO2 95%.   1. General Frail, ill-appearing female, laying in bed  2.  Is lethargic, open eyes to loud verbal stimuli, answering to her question, closes her eyes right away  3. No F.N deficits, ALL C.Nerves Intact,  4. Ears and Eyes appear Normal, Conjunctivae clear, PERRLA. Moist Oral Mucosa.  5. S No JVD, No cervical lymphadenopathy appriciated, No Carotid Bruits.  6. Symmetrical Chest wall movement, managed air entry at the bases with Rales  7.  Irregular, No Gallops, Rubs or Murmurs, No Parasternal Heave.  8. Positive Bowel Sounds, Abdomen Soft, No tenderness, No organomegaly appriciated,No rebound -guarding or rigidity.  9.  No Cyanosis, Normal Skin Turgor, No Skin Rash or Bruise.  10. Good muscle tone,  joints appear normal , no effusions, Normal ROM.     Data Review:    CBC Recent Labs  Lab 03/01/23 1042  WBC 9.0  HGB 12.8  HCT 39.9  PLT 159  MCV 92.1  MCH 29.6  MCHC 32.1  RDW 16.0*  LYMPHSABS 2.4  MONOABS 0.8  EOSABS 0.6*  BASOSABS 0.0   ------------------------------------------------------------------------------------------------------------------  Chemistries  Recent Labs  Lab 03/01/23 1042  NA 135  K 4.5  CL 95*  CO2 32  GLUCOSE 96  BUN 18  CREATININE 0.95  CALCIUM 9.5  AST 15  ALT 10  ALKPHOS 73  BILITOT 0.8   ------------------------------------------------------------------------------------------------------------------ estimated creatinine clearance is 34.6 mL/min (by C-G formula based on SCr of 0.95  mg/dL). ------------------------------------------------------------------------------------------------------------------ Recent Labs    03/01/23 1042  TSH 4.007    Coagulation profile No results for input(s): "INR", "PROTIME" in the last 168 hours. ------------------------------------------------------------------------------------------------------------------- No results for input(s): "DDIMER" in the last 72 hours. -------------------------------------------------------------------------------------------------------------------  Cardiac Enzymes No results for input(s): "CKMB", "TROPONINI", "MYOGLOBIN" in the last 168 hours.  Invalid input(s): "CK" ------------------------------------------------------------------------------------------------------------------    Component Value Date/Time   BNP 82.0 03/01/2023 1042     ---------------------------------------------------------------------------------------------------------------  Urinalysis    Component Value Date/Time   COLORURINE YELLOW 03/01/2023 1032   APPEARANCEUR HAZY (A) 03/01/2023 1032   APPEARANCEUR Clear 02/21/2022 1533   LABSPEC 1.014 03/01/2023 1032   PHURINE 5.0 03/01/2023 1032   GLUCOSEU NEGATIVE 03/01/2023 1032   HGBUR NEGATIVE 03/01/2023 1032   BILIRUBINUR NEGATIVE 03/01/2023 1032   BILIRUBINUR Negative 02/21/2022 1533   KETONESUR NEGATIVE 03/01/2023 1032   PROTEINUR NEGATIVE 03/01/2023 1032   UROBILINOGEN 0.2 10/18/2012 1944   NITRITE POSITIVE (A) 03/01/2023 1032   LEUKOCYTESUR SMALL (A) 03/01/2023 1032    ----------------------------------------------------------------------------------------------------------------   Imaging Results:    CT Chest W Contrast Result Date: 03/01/2023 CLINICAL DATA:  Respiratory illness with nondiagnostic x-ray. EXAM: CT CHEST WITH CONTRAST TECHNIQUE: Multidetector CT imaging of the chest was performed during intravenous contrast administration. RADIATION DOSE  REDUCTION: This exam was performed according to the departmental dose-optimization program which includes automated exposure control, adjustment of the mA and/or kV according to patient size and/or use of iterative reconstruction technique. CONTRAST:  75mL OMNIPAQUE IOHEXOL 300 MG/ML  SOLN COMPARISON:  Chest radiograph 03/01/2023.  CT chest 05/09/2020 FINDINGS: Cardiovascular: Cardiac enlargement. No pericardial effusions. Normal caliber thoracic aorta. Scattered calcification. No dissection. Although the contrast bolus is limited for evaluation of pulmonary arteries. No large central pulmonary embolus is identified. Mediastinum/Nodes: Thyroid gland is unremarkable. Esophagus is decompressed. There is a large esophageal hiatal hernia containing much of the stomach. Mediastinal lymphadenopathy. Largest subcarinal nodes measure up to 1.4 cm short axis dimension. This is similar to the prior study, likely representing reactive change. Lungs/Pleura: Motion artifact limits evaluation. There is consolidation or atelectasis in both lung bases. There is diffuse interstitial infiltration with patchy airspace disease in both lungs. This appearance is most likely represent multifocal pneumonia or edema. Peribronchial thickening with some mucous plugging suggesting acute or chronic bronchitis. Aspiration would be a secondary consideration. No pleural effusion or pneumothorax. Upper Abdomen: No acute abnormalities. Musculoskeletal: Degenerative changes in the spine. Old fracture deformity of the sternum. No acute bony abnormalities are identified. IMPRESSION: 1. Cardiac enlargement. 2. Diffuse airspace and interstitial disease in the lungs with atelectasis or consolidation in the lung bases and diffuse bronchitic changes. Appearances are most likely to represent multifocal pneumonia or edema. Aspiration could also be a consideration in the appropriate clinical setting. 3. Large esophageal hiatal hernia. 4. Aortic  atherosclerosis. Electronically Signed   By: Burman Nieves M.D.   On: 03/01/2023 15:56   DG Chest Port 1 View Result Date: 03/01/2023 CLINICAL DATA:  Cough and weakness. EXAM: PORTABLE CHEST 1 VIEW COMPARISON:  Chest radiograph dated 07/14/2022. FINDINGS: Patient is rotated to the left. The heart is borderline enlarged. Mediastinal contours are  within normal limits. Moderate hiatal hernia. Similar chronic elevation of the right hemidiaphragm. Indistinctness of the left costophrenic angle with suspected left basilar atelectasis. Diffuse bilateral interstitial prominence and coarsening. No acute osseous abnormality. IMPRESSION: 1. Limited examination demonstrating suspected small left pleural effusion with left basilar subsegmental atelectasis. 2. Diffuse interstitial prominence and coarsening could reflect chronic changes, edema, or atypical infection. Electronically Signed   By: Hart Robinsons M.D.   On: 03/01/2023 12:15   CT Head Wo Contrast Result Date: 03/01/2023 CLINICAL DATA:  Altered mental status, weakness for past couple of days. EXAM: CT HEAD WITHOUT CONTRAST TECHNIQUE: Contiguous axial images were obtained from the base of the skull through the vertex without intravenous contrast. RADIATION DOSE REDUCTION: This exam was performed according to the departmental dose-optimization program which includes automated exposure control, adjustment of the mA and/or kV according to patient size and/or use of iterative reconstruction technique. COMPARISON:  CT head 07/15/2022 FINDINGS: Brain: No acute intracranial hemorrhage. No CT evidence of acute infarct. Nonspecific hypoattenuation in the periventricular and subcortical white matter favored to reflect chronic microvascular ischemic changes. Remote lacunar infarcts in the basal ganglia. No edema, mass effect, or midline shift. The basilar cisterns are patent. Ventricles: Prominence of the ventricles suggesting underlying parenchymal volume loss. Vascular:  Atherosclerosis of the carotid siphons. No hyperdense vessel. Skull: Postsurgical changes of left frontal craniotomy. No acute or aggressive finding. Orbits: Right lens replacement.  Orbits otherwise symmetric. Sinuses: The visualized paranasal sinuses are clear. Other: Mastoid air cells are clear. IMPRESSION: No CT evidence of acute intracranial abnormality. Mild chronic microvascular ischemic changes and parenchymal volume loss. Remote lacunar infarcts in the basal ganglia. Postsurgical changes of left frontal craniotomy. Electronically Signed   By: Emily Filbert M.D.   On: 03/01/2023 11:46    EKG:  Vent. rate 52 BPM PR interval * ms QRS duration 110 ms QT/QTcB 433/403 ms P-R-T axes * 44 55 Atrial fibrillation Probable anteroseptal infarct, old   Assessment & Plan:    Principal Problem:   Pneumonia Active Problems:   Acute metabolic encephalopathy   COPD (chronic obstructive pulmonary disease) (HCC)   Coronary artery disease with angina pectoris (HCC)   Dyslipidemia (high LDL; low HDL)   Aortic atherosclerosis (HCC)   Acute metabolic/toxic encephalopathy -Multifactorial, in the setting of infectious process including pneumonia and UTI -Well can be related to polypharmacy, even though she is on chronically and these medications, but given her increasing age and facility they may be too much her at this point, as she is on gabapentin, Norco, Keppra, Robaxin Risperdal, Xanax and Zoloft, will hold these medications for now, but as I have discussed with the family, cannot stop all suddenly, so we will resume when she is safe to swallow, and this has to be addressed by her PCP where she needs to be weaned some of these meds gradually. -CT head with no acute findings -ABG to rule out CO2 retention. -N.p.o. till she is more alert, will consult SLP.  Addendum -ABG with significant CO2 retention of 73, this is most likely in the setting of increased somnolence and her known history of COPD,  she will be initiated on BiPAP, will start empirically on DuoNebs and as needed albuterol and steroids given underlying diagnosis of COPD.  Multi focal pneumonia -As evident on imaging, concern for aspiration as well given sedation. -Will keep on broad-spectrum antibiotic vancomycin, cefepime and Flagyl -Follow-up blood cultures and urine cultures -Neuro when appropriate  UTI -Continue with IV cefepime for  now, follow on urine cultures  Hypothermia -Due to above, continue with Bair hugger  Acute hypoxic respiratory failure -On 2 L nasal cannula, due to multifocal pneumonia -As needed albuterol. -Incentive spirometry and flutter valve when she is more awake    Coronary artery disease with angina pectoris (HCC) History of ST elevation MI and cardiac arrest 2020, with successful PCI/DES. -Resume Home cardiac meds aspirin, statins, carvedilol when safe to swallow   COPD (chronic obstructive pulmonary disease) (HCC) Stable. No wheezing   Major depression, recurrent, chronic (HCC) Hold risperidone, Xanax with encephalopathy and p.o. status  -Resume when safe to swallow  HTN (hypertension) Blood pressure stable, meds on hold due to n.p.o. 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  Code Status DNR  Likely DC to home  Consults called: None  Admission status: Inpatient  Time spent in minutes : 70 minutes  The patient is critically ill with multi-organ failure.   Critical care was necessary to treat or prevent imminent or life-threatening deterioration of Acute respiratory failure, acute encephalopathy, hypothermia and was exclusive of separately billable procedures and treating other patients. Total critical care time spent by me: 70 minutes Time spent personally by me on obtaining history from patient or surrogate, evaluation of the patient, evaluation of patient's response to treatment, ordering and review of laboratory studies, ordering and review of radiographic studies, ordering and performing treatments and interventions, and re-evaluation of the patient's condition.   Huey Bienenstock M.D on 03/01/2023 at 5:45 PM   [facility] - Office  [phone]

## 2023-03-01 NOTE — ED Triage Notes (Signed)
 Pt BIB family for increased AMS and weakness within the past couple of days. Per family pt can normally walk around and assist with her ADLs. Pt has been hearing things and not helping anymore. Family states that last time this happened it was a UTI but sent a UA to the providers office and they stated her urine was clean. Concerns of pneumonia as well.

## 2023-03-01 NOTE — ED Notes (Signed)
 Patient has denied bipap at this time. The family agrees. Patient is alert and oriented to date, place, self and all the things.

## 2023-03-01 NOTE — Progress Notes (Signed)
 Pharmacy Antibiotic Note  Beverly [name] is a 89 y.o. female admitted on 03/01/2023 with  unknown source of infection .  Pharmacy has been consulted for Cefepime and Vancomycin source dosing.  Plan: Vancomycin 1250mg  mg IV Q 48 hrs. Goal AUC 400-550. Expected AUC: 512 SCr used: 0.95 Cefepime 2gm IV q12h F/U cxs and clinical progress Monitor V/S, labs an levels as indicated   Height: 5' (152.4 cm) Weight: 65.6 kg (144 lb 10 oz) IBW/kg (Calculated) : 45.5  Temp (24hrs), Avg:95.3 F (35.2 C), Min:94.5 F (34.7 C), Max:96.1 F (35.6 C)  Recent Labs  Lab 03/01/23 1042 03/01/23 1045 03/01/23 1315  WBC 9.0  --   --   CREATININE 0.95  --   --   LATICACIDVEN  --  0.8 0.6    Estimated Creatinine Clearance: 34.6 mL/min (by C-G formula based on SCr of 0.95 mg/dL).    Allergies  Allergen Reactions   Lisinopril Swelling   Egg-Derived Products Itching    Patient reports that she can eat eggs but does not tolerate egg derived products   Morphine And Codeine     Headache   Penicillins Itching         Antimicrobials this admission: Cefepime  2/26 >>  Vancomycin  2/26 >>   Microbiology results: 2/26 BCx: pending 2/26 UCx: TBC   MRSA PCR:   Thank you for allowing pharmacy to be a part of this patient's care.  Elder Cyphers, BS Pharm D, BCPS Clinical Pharmacist 03/01/2023 5:24 PM

## 2023-03-02 DIAGNOSIS — J189 Pneumonia, unspecified organism: Secondary | ICD-10-CM | POA: Diagnosis not present

## 2023-03-02 LAB — BASIC METABOLIC PANEL
Anion gap: 11 (ref 5–15)
BUN: 17 mg/dL (ref 8–23)
CO2: 28 mmol/L (ref 22–32)
Calcium: 9.2 mg/dL (ref 8.9–10.3)
Chloride: 97 mmol/L — ABNORMAL LOW (ref 98–111)
Creatinine, Ser: 0.69 mg/dL (ref 0.44–1.00)
GFR, Estimated: >60 mL/min (ref >60)
Glucose, Bld: 133 mg/dL — ABNORMAL HIGH (ref 70–99)
Potassium: 4.5 mmol/L (ref 3.5–5.1)
Sodium: 136 mmol/L (ref 135–145)

## 2023-03-02 LAB — CBC
HCT: 36.8 % (ref 36.0–46.0)
Hemoglobin: 12 g/dL (ref 12.0–15.0)
MCH: 29.9 pg (ref 26.0–34.0)
MCHC: 32.6 g/dL (ref 30.0–36.0)
MCV: 91.5 fL (ref 80.0–100.0)
Platelets: 158 10*3/uL (ref 150–400)
RBC: 4.02 MIL/uL (ref 3.87–5.11)
RDW: 15.7 % — ABNORMAL HIGH (ref 11.5–15.5)
WBC: 7.7 10*3/uL (ref 4.0–10.5)
nRBC: 0 % (ref 0.0–0.2)

## 2023-03-02 LAB — MRSA NEXT GEN BY PCR, NASAL: MRSA by PCR Next Gen: NOT DETECTED

## 2023-03-02 LAB — BLOOD GAS, VENOUS
Acid-Base Excess: 5.5 mmol/L — ABNORMAL HIGH (ref 0.0–2.0)
Bicarbonate: 31.1 mmol/L — ABNORMAL HIGH (ref 20.0–28.0)
Drawn by: 6643
O2 Saturation: 96.7 %
Patient temperature: 35.7
pCO2, Ven: 45 mmHg (ref 44–60)
pH, Ven: 7.44 — ABNORMAL HIGH (ref 7.25–7.43)
pO2, Ven: 67 mmHg — ABNORMAL HIGH (ref 32–45)

## 2023-03-02 MED ORDER — LEVETIRACETAM 500 MG PO TABS
500.0000 mg | ORAL_TABLET | Freq: Two times a day (BID) | ORAL | Status: DC
  Administered 2023-03-02 – 2023-03-03 (×3): 500 mg via ORAL
  Filled 2023-03-02 (×3): qty 1

## 2023-03-02 MED ORDER — CARVEDILOL 12.5 MG PO TABS
12.5000 mg | ORAL_TABLET | Freq: Two times a day (BID) | ORAL | Status: DC
  Administered 2023-03-02 – 2023-03-03 (×3): 12.5 mg via ORAL
  Filled 2023-03-02 (×3): qty 1

## 2023-03-02 MED ORDER — AMLODIPINE BESYLATE 5 MG PO TABS
10.0000 mg | ORAL_TABLET | Freq: Every day | ORAL | Status: DC
  Administered 2023-03-02 – 2023-03-03 (×2): 10 mg via ORAL
  Filled 2023-03-02 (×2): qty 2

## 2023-03-02 MED ORDER — GABAPENTIN 100 MG PO CAPS
100.0000 mg | ORAL_CAPSULE | Freq: Every day | ORAL | Status: DC
Start: 2023-03-02 — End: 2023-03-03
  Administered 2023-03-02: 100 mg via ORAL
  Filled 2023-03-02: qty 1

## 2023-03-02 MED ORDER — CELECOXIB 100 MG PO CAPS
200.0000 mg | ORAL_CAPSULE | Freq: Two times a day (BID) | ORAL | Status: DC
  Administered 2023-03-02 – 2023-03-03 (×3): 200 mg via ORAL
  Filled 2023-03-02: qty 1
  Filled 2023-03-02 (×2): qty 2
  Filled 2023-03-02 (×2): qty 1

## 2023-03-02 MED ORDER — MEMANTINE HCL 10 MG PO TABS
5.0000 mg | ORAL_TABLET | Freq: Two times a day (BID) | ORAL | Status: DC
  Administered 2023-03-02 – 2023-03-03 (×3): 5 mg via ORAL
  Filled 2023-03-02 (×2): qty 1

## 2023-03-02 MED ORDER — ALPRAZOLAM 0.5 MG PO TABS
0.5000 mg | ORAL_TABLET | Freq: Two times a day (BID) | ORAL | Status: DC | PRN
  Administered 2023-03-02 (×2): 0.5 mg via ORAL
  Filled 2023-03-02 (×2): qty 1

## 2023-03-02 MED ORDER — HYDROCODONE-ACETAMINOPHEN 10-325 MG PO TABS
1.0000 | ORAL_TABLET | ORAL | Status: DC | PRN
  Administered 2023-03-02 – 2023-03-03 (×3): 1 via ORAL
  Filled 2023-03-02 (×3): qty 1

## 2023-03-02 MED ORDER — ATORVASTATIN CALCIUM 40 MG PO TABS
80.0000 mg | ORAL_TABLET | Freq: Every day | ORAL | Status: DC
  Administered 2023-03-02: 80 mg via ORAL
  Filled 2023-03-02: qty 2

## 2023-03-02 MED ORDER — ASPIRIN 81 MG PO TBEC
81.0000 mg | DELAYED_RELEASE_TABLET | Freq: Every day | ORAL | Status: DC
  Administered 2023-03-03: 81 mg via ORAL
  Filled 2023-03-02: qty 1

## 2023-03-02 MED ORDER — PANTOPRAZOLE SODIUM 40 MG IV SOLR
40.0000 mg | Freq: Every day | INTRAVENOUS | Status: DC
  Administered 2023-03-02 – 2023-03-03 (×2): 40 mg via INTRAVENOUS
  Filled 2023-03-02 (×2): qty 10

## 2023-03-02 MED ORDER — PANTOPRAZOLE SODIUM 40 MG PO TBEC
40.0000 mg | DELAYED_RELEASE_TABLET | Freq: Every day | ORAL | Status: DC
Start: 2023-03-02 — End: 2023-03-02
  Filled 2023-03-02: qty 1

## 2023-03-02 MED ORDER — SERTRALINE HCL 50 MG PO TABS
50.0000 mg | ORAL_TABLET | Freq: Every day | ORAL | Status: DC
  Administered 2023-03-02 – 2023-03-03 (×2): 50 mg via ORAL
  Filled 2023-03-02 (×2): qty 1

## 2023-03-02 MED ORDER — FAMOTIDINE 20 MG PO TABS
20.0000 mg | ORAL_TABLET | Freq: Every day | ORAL | Status: DC
  Administered 2023-03-02 – 2023-03-03 (×2): 20 mg via ORAL
  Filled 2023-03-02 (×2): qty 1

## 2023-03-02 MED ORDER — RISPERIDONE 0.5 MG PO TABS
0.5000 mg | ORAL_TABLET | Freq: Two times a day (BID) | ORAL | Status: DC
  Administered 2023-03-02 – 2023-03-03 (×3): 0.5 mg via ORAL
  Filled 2023-03-02 (×3): qty 1

## 2023-03-02 NOTE — Progress Notes (Signed)
 PROGRESS NOTE    Beverly [name]  BJY:[id number] DOB: 03-09-1932 DOA: 03/01/2023 PCP: Benita Stabile, MD   Brief Narrative:    Beverly [name]  is a 89 y.o. female,with medical history significant for STEMI, Cardiac arrest, COPD, coronary artery disease, subdural hematoma, hypertension, seizures. Patient presented to ED due to progressive weakness, patient is currently somnolent, family assisting with the history, daughter and granddaughter at the bedside, patient with increased confusion, increased lethargy, more confused, this is progressive over the last 2 weeks.  She was admitted for evaluation of acute encephalopathy-multifactorial with noted hypercapnia.  She has not required BiPAP overnight and mentation has improved considerably.  She remains on IV antibiotics for multifocal pneumonia related to possible aspiration and is awaiting PT/OT evaluation.  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 -Will keep on broad-spectrum antibiotic vancomycin, cefepime and Flagyl -Follow-up blood cultures and urine cultures, currently with no growth on blood cultures and urine cultures pending -Neuro when appropriate   UTI -Continue with IV cefepime for  now, follow on urine cultures   Hypothermia-resolved   Acute hypoxic respiratory failure -On 2 L nasal cannula, due to multifocal pneumonia -Wean oxygen as tolerated     Coronary artery disease with angina pectoris (HCC) History of ST elevation MI and cardiac arrest 2020, with successful PCI/DES. -Resume Home cardiac meds aspirin, statins, carvedilol when safe to swallow   COPD (chronic obstructive pulmonary disease) (HCC) Stable. 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    DVT prophylaxis:Heparin Code Status: DNR Family Communication: None at bedside Disposition Plan:  Status is: Inpatient Remains inpatient appropriate because: Need for IV medications.   Consultants:  None  Procedures:  None  Antimicrobials:  Anti-infectives (From admission, onward)    Start     Dose/Rate Route Frequency Ordered Stop   03/03/23 1600  vancomycin (VANCOREADY) IVPB 1250 mg/250 mL        1,250 mg 166.7 mL/hr over 90 Minutes Intravenous  Once 03/01/23 1723     03/02/23 1200  cefTRIAXone (ROCEPHIN) 1 g in sodium chloride 0.9 % 100 mL IVPB  Status:  Discontinued        1 g 200 mL/hr over 30 Minutes Intravenous  Once 03/01/23 1634 03/01/23 1706   03/01/23 1800  ceFEPIme (MAXIPIME) 2 g in sodium chloride 0.9 % 100 mL IVPB        2 g 200 mL/hr over 30 Minutes Intravenous Every 12 hours 03/01/23 1723     03/01/23 1715  vancomycin (VANCOCIN) IVPB 1000 mg/200 mL premix  Status:  Discontinued  1,000 mg 200 mL/hr over 60 Minutes Intravenous  Once 03/01/23 1706 03/01/23 1708   03/01/23 1715  vancomycin (VANCOREADY) IVPB 1250 mg/250 mL        1,250 mg 166.7 mL/hr over 90 Minutes Intravenous   Once 03/01/23 1708 03/02/23 0719   03/01/23 1700  azithromycin (ZITHROMAX) 500 mg in sodium chloride 0.9 % 250 mL IVPB  Status:  Discontinued        500 mg 250 mL/hr over 60 Minutes Intravenous Every 24 hours 03/01/23 1635 03/01/23 1706   03/01/23 1645  metroNIDAZOLE (FLAGYL) IVPB 500 mg        500 mg 100 mL/hr over 60 Minutes Intravenous Every 12 hours 03/01/23 1634     03/01/23 1245  cefTRIAXone (ROCEPHIN) 1 g in sodium chloride 0.9 % 100 mL IVPB        1 g 200 mL/hr over 30 Minutes Intravenous  Once 03/01/23 1235 03/01/23 1513      Subjective: Patient seen and evaluated today with more alertness and overall improvement in mentation noted.  She is complaining of neck and back pain this morning.  No other acute overnight concerns noted.  Did not want to wear BiPAP overnight.  Objective: Vitals:   03/02/23 0400 03/02/23 0500 03/02/23 0600 03/02/23 0720  BP: (!) 147/63     Pulse: 99 97 98 99  Resp: 17 13 15  (!) 23  Temp: 98.6 F (37 C) 98.6 F (37 C) 98.6 F (37 C) 98.4 F (36.9 C)  TempSrc:      SpO2: 97% 95% 98% 97%  Weight:      Height:        Intake/Output Summary (Last 24 hours) at 03/02/2023 0926 Last data filed at 03/02/2023 0300 Gross per 24 hour  Intake 954.62 ml  Output 625 ml  Net 329.62 ml   Filed Weights   03/01/23 1017  Weight: 65.6 kg    Examination:  General exam: Appears calm and comfortable  Respiratory system: Clear to auscultation. Respiratory effort normal.  2 L nasal cannula Cardiovascular system: S1 & S2 heard, RRR.  Gastrointestinal system: Abdomen is soft Central nervous system: Alert and awake Extremities: No edema Skin: No significant lesions noted Psychiatry: Flat affect.    Data Reviewed: I have personally reviewed following labs and imaging studies  CBC: Recent Labs  Lab 03/01/23 1042 03/02/23 0409  WBC 9.0 7.7  NEUTROABS 5.2  --   HGB 12.8 12.0  HCT 39.9 36.8  MCV 92.1 91.5  PLT 159 158   Basic Metabolic  Panel: Recent Labs  Lab 03/01/23 1042 03/02/23 0409  NA 135 136  K 4.5 4.5  CL 95* 97*  CO2 32 28  GLUCOSE 96 133*  BUN 18 17  CREATININE 0.95 0.69  CALCIUM 9.5 9.2   GFR: Estimated Creatinine Clearance: 41.1 mL/min (by C-G formula based on SCr of 0.69 mg/dL). Liver Function Tests: Recent Labs  Lab 03/01/23 1042  AST 15  ALT 10  ALKPHOS 73  BILITOT 0.8  PROT 7.1  ALBUMIN 3.5   No results for input(s): "LIPASE", "AMYLASE" in the last 168 hours. No results for input(s): "AMMONIA" in the last 168 hours. Coagulation Profile: No results for input(s): "INR", "PROTIME" in the last 168 hours. Cardiac Enzymes: Recent Labs  Lab 03/01/23 1042  CKTOTAL 22*   BNP (last 3 results) No results for input(s): "PROBNP" in the last 8760 hours. HbA1C: No results for input(s): "HGBA1C" in the last 72 hours. CBG: Recent Labs  Lab  03/01/23 1037  GLUCAP 97   Lipid Profile: No results for input(s): "CHOL", "HDL", "LDLCALC", "TRIG", "CHOLHDL", "LDLDIRECT" in the last 72 hours. Thyroid Function Tests: Recent Labs    03/01/23 1042  TSH 4.007   Anemia Panel: No results for input(s): "VITAMINB12", "FOLATE", "FERRITIN", "TIBC", "IRON", "RETICCTPCT" in the last 72 hours. Sepsis Labs: Recent Labs  Lab 03/01/23 1045 03/01/23 1315  LATICACIDVEN 0.8 0.6    Recent Results (from the past 240 hours)  Resp panel by RT-PCR (RSV, Flu A&B, Covid) Urine, Clean Catch     Status: None   Collection Time: 03/01/23 10:32 AM   Specimen: Urine, Clean Catch; Nasal Swab  Result Value Ref Range Status   SARS Coronavirus 2 by RT PCR NEGATIVE NEGATIVE Final    Comment: (NOTE) SARS-CoV-2 target nucleic acids are NOT DETECTED.  The SARS-CoV-2 RNA is generally detectable in upper respiratory specimens during the acute phase of infection. The lowest concentration of SARS-CoV-2 viral copies this assay can detect is 138 copies/mL. A negative result does not preclude SARS-Cov-2 infection and should not  be used as the sole basis for treatment or other patient management decisions. A negative result may occur with  improper specimen collection/handling, submission of specimen other than nasopharyngeal swab, presence of viral mutation(s) within the areas targeted by this assay, and inadequate number of viral copies(<138 copies/mL). A negative result must be combined with clinical observations, patient history, and epidemiological information. The expected result is Negative.  Fact Sheet for Patients:  BloggerCourse.com  Fact Sheet for Healthcare Providers:  SeriousBroker.it  This test is no t yet approved or cleared by the [place] FDA and  has been authorized for detection and/or diagnosis of SARS-CoV-2 by FDA under an Emergency Use Authorization (EUA). This EUA will remain  in effect (meaning this test can be used) for the duration of the COVID-19 declaration under Section 564(b)(1) of the Act, 21 U.S.C.section 360bbb-3(b)(1), unless the authorization is terminated  or revoked sooner.       Influenza A by PCR NEGATIVE NEGATIVE Final   Influenza B by PCR NEGATIVE NEGATIVE Final    Comment: (NOTE) The Xpert Xpress SARS-CoV-2/FLU/RSV plus assay is intended as an aid in the diagnosis of influenza from Nasopharyngeal swab specimens and should not be used as a sole basis for treatment. Nasal washings and aspirates are unacceptable for Xpert Xpress SARS-CoV-2/FLU/RSV testing.  Fact Sheet for Patients: BloggerCourse.com  Fact Sheet for Healthcare Providers: SeriousBroker.it  This test is not yet approved or cleared by the [place] FDA and has been authorized for detection and/or diagnosis of SARS-CoV-2 by FDA under an Emergency Use Authorization (EUA). This EUA will remain in effect (meaning this test can be used) for the duration of the COVID-19 declaration under Section  564(b)(1) of the Act, 21 U.S.C. section 360bbb-3(b)(1), unless the authorization is terminated or revoked.     Resp Syncytial Virus by PCR NEGATIVE NEGATIVE Final    Comment: (NOTE) Fact Sheet for Patients: BloggerCourse.com  Fact Sheet for Healthcare Providers: SeriousBroker.it  This test is not yet approved or cleared by the [place] FDA and has been authorized for detection and/or diagnosis of SARS-CoV-2 by FDA under an Emergency Use Authorization (EUA). This EUA will remain in effect (meaning this test can be used) for the duration of the COVID-19 declaration under Section 564(b)(1) of the Act, 21 U.S.C. section 360bbb-3(b)(1), unless the authorization is terminated or revoked.  Performed at [facility], [address] [place], [place] [address]  Blood culture (routine x 2)     Status: None (Preliminary result)   Collection Time: 03/01/23 10:41 AM   Specimen: BLOOD  Result Value Ref Range Status   Specimen Description BLOOD LEFT ASSIST CONTROL  Final   Special Requests   Final    BOTTLES DRAWN AEROBIC AND ANAEROBIC Blood Culture adequate volume   Culture   Final    NO GROWTH < 24 HOURS Performed at [facility], [address] [place], [place] [address]    Report Status PENDING  Incomplete  Blood culture (routine x 2)     Status: None (Preliminary result)   Collection Time: 03/01/23 11:58 AM   Specimen: BLOOD  Result Value Ref Range Status   Specimen Description BLOOD BLOOD RIGHT HAND  Final   Special Requests   Final    BOTTLES DRAWN AEROBIC ONLY Blood Culture results may not be optimal due to an inadequate volume of blood received in culture bottles   Culture   Final    NO GROWTH < 24 HOURS Performed at [facility], [address], [place], [place] [address]    Report Status PENDING  Incomplete  MRSA Next Gen by PCR, Nasal     Status: None   Collection Time: 03/01/23  8:00 PM   Specimen: Nasal Mucosa; Nasal  Swab  Result Value Ref Range Status   MRSA by PCR Next Gen NOT DETECTED NOT DETECTED Final    Comment: (NOTE) The GeneXpert MRSA Assay (FDA approved for NASAL specimens only), is one component of a comprehensive MRSA colonization surveillance program. It is not intended to diagnose MRSA infection nor to guide or monitor treatment for MRSA infections. Test performance is not FDA approved in patients less than 13 years old. Performed at [facility], [address], [place], [place] [address]          Radiology Studies: CT Chest W Contrast Result Date: 03/01/2023 CLINICAL DATA:  Respiratory illness with nondiagnostic x-ray. EXAM: CT CHEST WITH CONTRAST TECHNIQUE: Multidetector CT imaging of the chest was performed during intravenous contrast administration. RADIATION DOSE REDUCTION: This exam was performed according to the departmental dose-optimization program which includes automated exposure control, adjustment of the mA and/or kV according to patient size and/or use of iterative reconstruction technique. CONTRAST:  75mL OMNIPAQUE IOHEXOL 300 MG/ML  SOLN COMPARISON:  Chest radiograph 03/01/2023.  CT chest 05/09/2020 FINDINGS: Cardiovascular: Cardiac enlargement. No pericardial effusions. Normal caliber thoracic aorta. Scattered calcification. No dissection. Although the contrast bolus is limited for evaluation of pulmonary arteries. No large central pulmonary embolus is identified. Mediastinum/Nodes: Thyroid gland is unremarkable. Esophagus is decompressed. There is a large esophageal hiatal hernia containing much of the stomach. Mediastinal lymphadenopathy. Largest subcarinal nodes measure up to 1.4 cm short axis dimension. This is similar to the prior study, likely representing reactive change. Lungs/Pleura: Motion artifact limits evaluation. There is consolidation or atelectasis in both lung bases. There is diffuse interstitial infiltration with patchy airspace disease in both lungs. This  appearance is most likely represent multifocal pneumonia or edema. Peribronchial thickening with some mucous plugging suggesting acute or chronic bronchitis. Aspiration would be a secondary consideration. No pleural effusion or pneumothorax. Upper Abdomen: No acute abnormalities. Musculoskeletal: Degenerative changes in the spine. Old fracture deformity of the sternum. No acute bony abnormalities are identified. IMPRESSION: 1. Cardiac enlargement. 2. Diffuse airspace and interstitial disease in the lungs with atelectasis or consolidation in the lung bases and diffuse bronchitic changes. Appearances are most likely to represent multifocal pneumonia or  edema. Aspiration could also be a consideration in the appropriate clinical setting. 3. Large esophageal hiatal hernia. 4. Aortic atherosclerosis. Electronically Signed   By: Burman Nieves M.D.   On: 03/01/2023 15:56   DG Chest Port 1 View Result Date: 03/01/2023 CLINICAL DATA:  Cough and weakness. EXAM: PORTABLE CHEST 1 VIEW COMPARISON:  Chest radiograph dated 07/14/2022. FINDINGS: Patient is rotated to the left. The heart is borderline enlarged. Mediastinal contours are within normal limits. Moderate hiatal hernia. Similar chronic elevation of the right hemidiaphragm. Indistinctness of the left costophrenic angle with suspected left basilar atelectasis. Diffuse bilateral interstitial prominence and coarsening. No acute osseous abnormality. IMPRESSION: 1. Limited examination demonstrating suspected small left pleural effusion with left basilar subsegmental atelectasis. 2. Diffuse interstitial prominence and coarsening could reflect chronic changes, edema, or atypical infection. Electronically Signed   By: Hart Robinsons M.D.   On: 03/01/2023 12:15   CT Head Wo Contrast Result Date: 03/01/2023 CLINICAL DATA:  Altered mental status, weakness for past couple of days. EXAM: CT HEAD WITHOUT CONTRAST TECHNIQUE: Contiguous axial images were obtained from the base  of the skull through the vertex without intravenous contrast. RADIATION DOSE REDUCTION: This exam was performed according to the departmental dose-optimization program which includes automated exposure control, adjustment of the mA and/or kV according to patient size and/or use of iterative reconstruction technique. COMPARISON:  CT head 07/15/2022 FINDINGS: Brain: No acute intracranial hemorrhage. No CT evidence of acute infarct. Nonspecific hypoattenuation in the periventricular and subcortical white matter favored to reflect chronic microvascular ischemic changes. Remote lacunar infarcts in the basal ganglia. No edema, mass effect, or midline shift. The basilar cisterns are patent. Ventricles: Prominence of the ventricles suggesting underlying parenchymal volume loss. Vascular: Atherosclerosis of the carotid siphons. No hyperdense vessel. Skull: Postsurgical changes of left frontal craniotomy. No acute or aggressive finding. Orbits: Right lens replacement.  Orbits otherwise symmetric. Sinuses: The visualized paranasal sinuses are clear. Other: Mastoid air cells are clear. IMPRESSION: No CT evidence of acute intracranial abnormality. Mild chronic microvascular ischemic changes and parenchymal volume loss. Remote lacunar infarcts in the basal ganglia. Postsurgical changes of left frontal craniotomy. Electronically Signed   By: Emily Filbert M.D.   On: 03/01/2023 11:46        Scheduled Meds:  amLODipine  10 mg Oral Daily   [START ON 03/03/2023] aspirin EC  81 mg Oral Q breakfast   atorvastatin  80 mg Oral q1800   carvedilol  12.5 mg Oral BID   celecoxib  200 mg Oral BID   Chlorhexidine Gluconate Cloth  6 each Topical Daily   famotidine  20 mg Oral Daily   gabapentin  100 mg Oral QHS   heparin  5,000 Units Subcutaneous Q8H   latanoprost  1 drop Both Eyes QHS   levETIRAcetam  500 mg Oral BID   memantine  5 mg Oral BID   methylPREDNISolone (SOLU-MEDROL) injection  125 mg Intravenous Daily   nystatin    Topical BID   pantoprazole  40 mg Oral Daily   risperiDONE  0.5 mg Oral BID   sertraline  50 mg Oral Daily   Continuous Infusions:  ceFEPime (MAXIPIME) IV 2 g (03/02/23 0534)   metronidazole 500 mg (03/02/23 0425)   [START ON 03/03/2023] vancomycin       LOS: 1 day    Time spent: 55 minutes    Lakayla Barrington D Sherryll Burger, DO [facility]  If 7PM-7AM, please contact night-coverage [email or web address] 03/02/2023, 9:26 AM

## 2023-03-02 NOTE — [place] FL2 (Signed)
 [place] MEDICAID FL2 LEVEL OF CARE FORM     IDENTIFICATION  Patient Name: Beverly [name] Birthdate: 01-22-1932 Sex: female Admission Date (Current Location): 03/01/2023  [facility] and [place] Number:  [employer] and Address:  [facility],  [address] [place] [address]      Provider Number: [id number]  Attending Physician Name and Address:  Erick Blinks, DO  Relative Name and Phone Number:       Current Level of Care: Hospital Recommended Level of Care: Skilled Nursing Facility Prior Approval Number:    Date Approved/Denied:   PASRR Number: [id number]  Discharge Plan: SNF    Current Diagnoses: Patient Active Problem List   Diagnosis Date Noted   Chronic pain 07/19/2022   Mild protein malnutrition (HCC) 07/19/2022   PNA (pneumonia) 07/14/2022   Acute hypoxic respiratory failure (HCC) 07/14/2022   Acute metabolic encephalopathy 07/14/2022   Cardiac murmur 07/14/2022   AKI (acute kidney injury) (HCC) 11/26/2021   Arthritis 11/24/2021   Increased intraocular pressure, bilateral 11/24/2021   GERD without esophagitis 11/24/2021   MCI (mild cognitive impairment) 11/24/2021   Major depression, recurrent, chronic (HCC) 11/24/2021   Aortic atherosclerosis (HCC) 11/24/2021   Coronary artery disease with angina pectoris (HCC) 11/24/2021   Right epiretinal membrane 04/13/2021   Lobar pneumonia (HCC) 05/09/2020   Goals of care, counseling/discussion 05/09/2020   Pleural effusion, right    Pneumonia 05/02/2020   CAP (community acquired pneumonia) 05/01/2020   Anemia 05/01/2020   Heme positive stool 05/01/2020   Advanced nonexudative age-related macular degeneration of right eye with subfoveal involvement 11/11/2019   Exudative age-related macular degeneration of left eye with active choroidal neovascularization (HCC) 05/13/2019   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 neovascularization (HCC) 05/13/2019    Primary open angle glaucoma of both eyes, mild stage 05/13/2019   Age-related nuclear cataract of left eye 05/13/2019   Hypokalemia 01/12/2018   Hypomagnesemia 01/12/2018   COPD (chronic obstructive pulmonary disease) (HCC) 01/12/2018   Acute ST elevation myocardial infarction (STEMI) of inferior wall (HCC) 01/10/2018   Cardiac arrest (HCC)    Dyslipidemia (high LDL; low HDL)    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, not intractable, without status epilepticus (HCC) 06/23/2014   History of traumatic subdural hematoma 06/23/2014   Subdural hematoma (HCC) 10/18/2012   TIA (transient ischemic attack) 10/18/2012   HTN (hypertension) 10/18/2012   Hyponatremia 10/18/2012    Orientation RESPIRATION BLADDER Height & Weight     Self, Place  Normal Continent Weight: 144 lb 10 oz (65.6 kg) Height:  5' (152.4 cm)  BEHAVIORAL SYMPTOMS/MOOD NEUROLOGICAL BOWEL NUTRITION STATUS      Continent Diet (Regular)  AMBULATORY STATUS COMMUNICATION OF NEEDS Skin   Extensive Assist Verbally Normal                       Personal Care Assistance Level of Assistance  Bathing, Feeding, Dressing Bathing Assistance: Limited assistance Feeding assistance: Independent Dressing Assistance: Limited assistance     Functional Limitations Info  Sight, Hearing, Speech Sight Info: Impaired Hearing Info: Impaired Speech Info: Adequate    SPECIAL CARE FACTORS FREQUENCY  PT (By licensed PT), OT (By licensed OT)     PT Frequency: 5 times weekly OT Frequency: 5 times weekly            Contractures Contractures Info: Not present    Additional Factors Info  Code Status, Allergies Code  Status Info: DNR Allergies Info: Lisinopril, Egg-derived Products, Morphine And Codeine, Penicillins           Current Medications (03/02/2023):  This is the current hospital active medication list Current Facility-Administered Medications  Medication Dose  Route Frequency Provider Last Rate Last Admin   albuterol (PROVENTIL) (2.5 MG/3ML) 0.083% nebulizer solution 2.5 mg  2.5 mg Nebulization Q2H PRN Elgergawy, Leana Roe, MD       ALPRAZolam Prudy Feeler) tablet 0.5 mg  0.5 mg Oral BID PRN Sherryll Burger, Pratik D, DO       amLODipine (NORVASC) tablet 10 mg  10 mg Oral Daily Sherryll Burger, Pratik D, DO   10 mg at 03/02/23 0952   [START ON 03/03/2023] aspirin EC tablet 81 mg  81 mg Oral Q breakfast Sherryll Burger, Pratik D, DO       atorvastatin (LIPITOR) tablet 80 mg  80 mg Oral q1800 Sherryll Burger, Pratik D, DO       carvedilol (COREG) tablet 12.5 mg  12.5 mg Oral BID Sherryll Burger, Pratik D, DO   12.5 mg at 03/02/23 7829   ceFEPIme (MAXIPIME) 2 g in sodium chloride 0.9 % 100 mL IVPB  2 g Intravenous Q12H Elgergawy, Leana Roe, MD 200 mL/hr at 03/02/23 0534 2 g at 03/02/23 0534   celecoxib (CELEBREX) capsule 200 mg  200 mg Oral BID Sherryll Burger, Pratik D, DO   200 mg at 03/02/23 1042   Chlorhexidine Gluconate Cloth 2 % PADS 6 each  6 each Topical Daily Elgergawy, Leana Roe, MD   6 each at 03/02/23 0825   famotidine (PEPCID) tablet 20 mg  20 mg Oral Daily Sherryll Burger, Pratik D, DO   20 mg at 03/02/23 5621   gabapentin (NEURONTIN) capsule 100 mg  100 mg Oral QHS Shah, Pratik D, DO       guaiFENesin-dextromethorphan (ROBITUSSIN DM) 100-10 MG/5ML syrup 5 mL  5 mL Oral Q4H PRN Elgergawy, Leana Roe, MD   5 mL at 03/02/23 1041   heparin injection 5,000 Units  5,000 Units Subcutaneous Q8H Elgergawy, Leana Roe, MD   5,000 Units at 03/02/23 3086   hydrALAZINE (APRESOLINE) injection 5 mg  5 mg Intravenous Q4H PRN Elgergawy, Leana Roe, MD       HYDROcodone-acetaminophen (NORCO) 10-325 MG per tablet 1 tablet  1 tablet Oral Q4H PRN Sherryll Burger, Pratik D, DO       ipratropium-albuterol (DUONEB) 0.5-2.5 (3) MG/3ML nebulizer solution 3 mL  3 mL Nebulization Q4H PRN Elgergawy, Leana Roe, MD       latanoprost (XALATAN) 0.005 % ophthalmic solution 1 drop  1 drop Both Eyes QHS Elgergawy, Leana Roe, MD   1 drop at 03/01/23 2236   levETIRAcetam (KEPPRA)  tablet 500 mg  500 mg Oral BID Sherryll Burger, Pratik D, DO   500 mg at 03/02/23 5784   memantine (NAMENDA) tablet 5 mg  5 mg Oral BID Sherryll Burger, Pratik D, DO   5 mg at 03/02/23 6962   metroNIDAZOLE (FLAGYL) IVPB 500 mg  500 mg Intravenous Q12H Elgergawy, Leana Roe, MD 100 mL/hr at 03/02/23 0425 500 mg at 03/02/23 0425   nystatin (MYCOSTATIN/NYSTOP) topical powder   Topical BID Elgergawy, Leana Roe, MD   Given at 03/02/23 0825   ondansetron (ZOFRAN) tablet 4 mg  4 mg Oral Q6H PRN Elgergawy, Leana Roe, MD       Or   ondansetron (ZOFRAN) injection 4 mg  4 mg Intravenous Q6H PRN Elgergawy, Leana Roe, MD       pantoprazole (PROTONIX) injection 40 mg  40 mg Intravenous Daily Maurilio Lovely D, DO   40 mg at 03/02/23 1044   risperiDONE (RISPERDAL) tablet 0.5 mg  0.5 mg Oral BID Maurilio Lovely D, DO   0.5 mg at 03/02/23 1610   sertraline (ZOLOFT) tablet 50 mg  50 mg Oral Daily Maurilio Lovely D, DO   50 mg at 03/02/23 9604     Discharge Medications: Please see discharge summary for a list of discharge medications.  Relevant Imaging Results:  Relevant Lab Results:   Additional Information SSN: [id number], [place]

## 2023-03-02 NOTE — Plan of Care (Signed)
  Problem: Acute Rehab PT Goals(only PT should resolve) Goal: Pt Will Go Supine/Side To Sit Outcome: Progressing Flowsheets (Taken 03/02/2023 1318) Pt will go Supine/Side to Sit: with moderate assist Goal: Patient Will Transfer Sit To/From Stand Outcome: Progressing Flowsheets (Taken 03/02/2023 1318) Patient will transfer sit to/from stand: with moderate assist Goal: Pt Will Transfer Bed To Chair/Chair To Bed Outcome: Progressing Flowsheets (Taken 03/02/2023 1318) Pt will Transfer Bed to Chair/Chair to Bed: with mod assist Goal: Pt Will Ambulate Outcome: Progressing Flowsheets (Taken 03/02/2023 1318) Pt will Ambulate:  10 feet  with moderate assist  with maximum assist  with rolling walker    Stela Iwasaki SPT

## 2023-03-02 NOTE — TOC Initial Note (Signed)
 Transition of Care [facility]) - Initial/Assessment Note    Patient Details  Name: Beverly [name] MRN: [id number] Date of Birth: 05-Jan-1932  [facility] CM/SW Contact:    Villa Herb, LCSWA Phone Number: 03/02/2023, 12:30 PM  Clinical Narrative:                 CSW updated that PT is recommending SNF for pt at D/C. CSW met with pt at bedside, pt seems to have some confusion so CSW reached out to pts daughter. Pts daughter states that pt lives with her and they have care when she is not in the home with pt. Pts daughter provides transportation when needed. Pt has had HH with [name] in the past. Pt has a transport chair, walker, wheelchair and all needed DME. Pt does not wear home O2. Pts daughter states that they are agreeable to this and pt has been to [facility] in the past and they have been very pleased with pts care. They prefer return to [facility] for SNF. CSW to complete referral and send out for review. TOC to follow.   Expected Discharge Plan: Skilled Nursing Facility Barriers to Discharge: Continued Medical Work up   Patient Goals and CMS Choice Patient states their goals for this hospitalization and ongoing recovery are:: go to SNF CMS Medicare.gov Compare Post Acute Care list provided to:: Patient Represenative (must comment) Choice offered to / list presented to : Adult Children [place] ownership interest in [employer]provided to:: Adult Children    Expected Discharge Plan and Services In-house Referral: Clinical Social Work Discharge Planning Services: CM Consult Post Acute Care Choice: Skilled Nursing Facility Living arrangements for the past 2 months: Single Family Home                                      Prior Living Arrangements/Services Living arrangements for the past 2 months: Single Family Home Lives with:: Adult Children Patient language and need for interpreter reviewed:: Yes Do you feel safe going back to the place where you live?:  Yes      Need for Family Participation in Patient Care: Yes (Comment) Care giver support system in place?: Yes (comment) Current home services: DME Criminal Activity/Legal Involvement Pertinent to Current Situation/Hospitalization: No - Comment as needed  Activities of Daily Living      Permission Sought/Granted                  Emotional Assessment Appearance:: Appears stated age       Alcohol / Substance Use: Not Applicable Psych Involvement: No (comment)  Admission diagnosis:  Pneumonia [J18.9] Hypothermia, initial encounter [T68.XXXA] Pneumonia of both lungs due to infectious organism, unspecified part of lung [J18.9] Patient Active Problem List   Diagnosis Date Noted   Chronic pain 07/19/2022   Mild protein malnutrition (HCC) 07/19/2022   PNA (pneumonia) 07/14/2022   Acute hypoxic respiratory failure (HCC) 07/14/2022   Acute metabolic encephalopathy 07/14/2022   Cardiac murmur 07/14/2022   AKI (acute kidney injury) (HCC) 11/26/2021   Arthritis 11/24/2021   Increased intraocular pressure, bilateral 11/24/2021   GERD without esophagitis 11/24/2021   MCI (mild cognitive impairment) 11/24/2021   Major depression, recurrent, chronic (HCC) 11/24/2021   Aortic atherosclerosis (HCC) 11/24/2021   Coronary artery disease with angina pectoris (HCC) 11/24/2021   Right epiretinal membrane 04/13/2021   Lobar pneumonia (HCC) 05/09/2020   Goals of  care, counseling/discussion 05/09/2020   Pleural effusion, right    Pneumonia 05/02/2020   CAP (community acquired pneumonia) 05/01/2020   Anemia 05/01/2020   Heme positive stool 05/01/2020   Advanced nonexudative age-related macular degeneration of right eye with subfoveal involvement 11/11/2019   Exudative age-related macular degeneration of left eye with active choroidal neovascularization (HCC) 05/13/2019   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 neovascularization (HCC) 05/13/2019    Primary open angle glaucoma of both eyes, mild stage 05/13/2019   Age-related nuclear cataract of left eye 05/13/2019   Hypokalemia 01/12/2018   Hypomagnesemia 01/12/2018   COPD (chronic obstructive pulmonary disease) (HCC) 01/12/2018   Acute ST elevation myocardial infarction (STEMI) of inferior wall (HCC) 01/10/2018   Cardiac arrest (HCC)    Dyslipidemia (high LDL; low HDL)    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 [place] [address] Phone: [phone] Fax: [phone]     Social Drivers of Health (SDOH) Social History: SDOH Screenings   Food Insecurity: No Food Insecurity (03/02/2023)  Housing: Low Risk  (03/02/2023)  Transportation Needs: No Transportation Needs (03/02/2023)  Utilities: Not At Risk (09/12/2022)  Depression (PHQ2-9): Low Risk  (09/12/2022)  Financial Resource Strain: Low Risk  (09/12/2022)  Social Connections: Socially Integrated (03/02/2023)  Stress: No Stress Concern Present (09/12/2022)  Tobacco Use: Low Risk  (03/01/2023)  Health Literacy: Adequate Health Literacy (08/12/2022)   SDOH Interventions:     Readmission Risk Interventions    03/02/2023   12:29 PM 07/17/2022    1:51 PM  Readmission Risk Prevention Plan  Medication Screening Complete   Transportation Screening Complete Complete  PCP or Specialist Appt within 5-7 Days  Complete  Home Care Screening  Complete  Medication Review (RN CM)  Complete

## 2023-03-02 NOTE — Evaluation (Signed)
 Physical Therapy Evaluation Patient Details Name: Beverly [name] MRN: [id number] DOB: 02-25-1932 Today's Date: 03/02/2023  History of Present Illness  Beverly [name]  is a 89 y.o. female,with medical history significant for STEMI, Cardiac arrest, COPD, coronary artery disease, subdural hematoma, hypertension, seizures.  -Patient presents to ED due to progressive weakness, patient is currently somnolent, family assisting with the history, daughter and granddaughter at the bedside, patient with increased confusion, increased lethargy, more confused, this is progressive over the last 2 weeks, to PCP last week, as this resembles previous UTI infections, where she did not have UTI, over last 24 hours she had significant decline, usually ambulates with a walker, but was more lethargic, very weak, unable to stand to get into the car today which prompted them to bring her to ED, of note patient on multiple sedative medications,   Clinical Impression  Patient was agreeable to therapy. Was mod/max during session. For sit to stand patient was max for initial boost to stand. RW was used for transfer. Patient limited to a step pivot transfer as during transfer became very fatigued and could not due anymore after sitting in chair. O2 was removed form patient and placed on room air. SpO2 was monitored during treatment and patient maintained 89-92% level throughout session. Patient was limited by fatigue and was left in chair at conclusion of session. And nursing staff was informed of patient being left on room air and SpO2 levels. Patient will benefit from continued skilled physical therapy in hospital and recommended venue below to increase strength, balance, endurance for safe ADLs and gait.          If plan is discharge home, recommend the following: A lot of help with bathing/dressing/bathroom;A lot of help with walking and/or transfers;Help with stairs or ramp for entrance;Assistance with cooking/housework   Can  travel by private vehicle        Equipment Recommendations None recommended by PT  Recommendations for Other Services       Functional Status Assessment Patient has had a recent decline in their functional status and demonstrates the ability to make significant improvements in function in a reasonable and predictable amount of time.     Precautions / Restrictions Precautions Precautions: Fall Restrictions Weight Bearing Restrictions Per Provider Order: No      Mobility  Bed Mobility Overal bed mobility: Needs Assistance Bed Mobility: Supine to Sit     Supine to sit: Mod assist, Max assist       Patient Response: Cooperative  Transfers Overall transfer level: Needs assistance Equipment used: Rolling walker (2 wheels) Transfers: Sit to/from Stand, Bed to chair/wheelchair/BSC Sit to Stand: Mod assist, Max assist   Step pivot transfers: Mod assist, Max assist       General transfer comment: max for initial boost to stand. became weak during transfer and was max for complete bed to chair transfer, with RW    Ambulation/Gait               General Gait Details: did not perform as patient was fatigued from transfer  Stairs            Wheelchair Mobility     Tilt Bed Tilt Bed Patient Response: Cooperative  Modified Rankin (Stroke Patients Only)       Balance Overall balance assessment: Needs assistance Sitting-balance support: Bilateral upper extremity supported, Feet supported Sitting balance-Leahy Scale: Fair Sitting balance - Comments: anterior lean while sitting EOB   Standing balance support: Bilateral upper extremity supported,  Reliant on assistive device for balance, During functional activity Standing balance-Leahy Scale: Poor Standing balance comment: unable to maintain upright posture without external assist                             Pertinent Vitals/Pain Pain Assessment Pain Assessment: 0-10 Pain Score: 4  Pain  Location: back of the head Pain Descriptors / Indicators: Aching, Constant, Discomfort Pain Intervention(s): Monitored during session, Limited activity within patient's tolerance    Home Living Family/patient expects to be discharged to:: Private residence Living Arrangements: Children Available Help at Discharge: Family;Available 24 hours/day;Personal care attendant Type of Home: House Home Access: Ramped entrance       Home Layout: One level Home Equipment: Agricultural consultant (2 wheels);Wheelchair - manual;Shower seat;BSC/3in1;Grab bars - tub/shower      Prior Function Prior Level of Function : Needs assist             Mobility Comments: household ambulator using RW ADLs Comments: assisted by family     Extremity/Trunk Assessment   Upper Extremity Assessment Upper Extremity Assessment: Defer to OT evaluation LUE Deficits / Details: LUE weaker than RUE, limited ROM LUE Coordination: decreased fine motor;decreased gross motor    Lower Extremity Assessment Lower Extremity Assessment: Generalized weakness    Cervical / Trunk Assessment Cervical / Trunk Assessment: Kyphotic  Communication   Communication Communication: No apparent difficulties Factors Affecting Communication: Hearing impaired    Cognition Arousal: Alert Behavior During Therapy: WFL for tasks assessed/performed   PT - Cognitive impairments: No family/caregiver present to determine baseline                         Following commands: Intact       Cueing Cueing Techniques: Verbal cues, Gestural cues, Tactile cues     General Comments General comments (skin integrity, edema, etc.): VSS on 1L    Exercises     Assessment/Plan    PT Assessment Patient needs continued PT services  PT Problem List Decreased strength;Decreased range of motion;Decreased activity tolerance;Decreased balance;Decreased mobility       PT Treatment Interventions DME instruction;Gait training;Patient/family  education;Stair training;Functional mobility training;Therapeutic activities;Therapeutic exercise;Balance training    PT Goals (Current goals can be found in the Care Plan section)  Acute Rehab PT Goals Patient Stated Goal: to return home after rehab PT Goal Formulation: With patient Time For Goal Achievement: 03/16/23 Potential to Achieve Goals: Good    Frequency Min 3X/week     Co-evaluation               AM-PAC PT "6 Clicks" Mobility  Outcome Measure Help needed turning from your back to your side while in a flat bed without using bedrails?: A Lot Help needed moving from lying on your back to sitting on the side of a flat bed without using bedrails?: A Lot Help needed moving to and from a bed to a chair (including a wheelchair)?: A Lot Help needed standing up from a chair using your arms (e.g., wheelchair or bedside chair)?: A Lot Help needed to walk in hospital room?: Total Help needed climbing 3-5 steps with a railing? : Total 6 Click Score: 10    End of Session   Activity Tolerance: Patient tolerated treatment well;Patient limited by fatigue Patient left: in chair;with call bell/phone within reach;with chair alarm set Nurse Communication: Mobility status PT Visit Diagnosis: Unsteadiness on feet (R26.81);Other abnormalities of gait and  mobility (R26.89);Muscle weakness (generalized) (M62.81)    Time: 4403-4742 PT Time Calculation (min) (ACUTE ONLY): 30 min   Charges:   PT Evaluation $PT Eval Moderate Complexity: 1 Mod PT Treatments $Therapeutic Activity: 23-37 mins PT General Charges $$ ACUTE PT VISIT: 1 Visit         Wen Munford SPT

## 2023-03-02 NOTE — Evaluation (Signed)
 Occupational Therapy Evaluation Patient Details Name: Beverly [name] MRN: [id number] DOB: 1932-04-05 Today's Date: 03/02/2023   History of Present Illness   89 y.o. F admitted on 03/01/23 due to increased weakness, somnolence, confusion, which has increased in the past 2 weeks. UTI negative, work up for pneumonia at this time. PMH significant for  STEMI, Cardiac arrest, COPD, coronary artery disease, subdural hematoma, hypertension, seizures.     Clinical Impressions Pt admitted for concerns listed above. PTA pt was living with her daughter and family, where she could ambulate with a RW and family/aide provided assist for ADL's as needed. This session, pt presents with mild confusion and slowed processing. She will benefit from +2 assist for all functional mobility and ADL's that require standing. As well as min to mod assist for seated ADL's due to weakness and confusion. Recommending further skilled inpatient rehab to assist with improving independence and decreasing burden of care. Acute OT will continue to follow.      If plan is discharge home, recommend the following:   Two people to help with walking and/or transfers;Two people to help with bathing/dressing/bathroom;Assistance with cooking/housework;Direct supervision/assist for medications management;Help with stairs or ramp for entrance;Supervision due to cognitive status     Functional Status Assessment   Patient has had a recent decline in their functional status and demonstrates the ability to make significant improvements in function in a reasonable and predictable amount of time.     Equipment Recommendations   None recommended by OT     Recommendations for Other Services         Precautions/Restrictions   Precautions Precautions: Fall Restrictions Weight Bearing Restrictions Per Provider Order: No     Mobility Bed Mobility               General bed mobility comments: up in recliner on entry     Transfers Overall transfer level: Needs assistance Equipment used: Rolling walker (2 wheels) Transfers: Sit to/from Stand Sit to Stand: Max assist, From elevated surface           General transfer comment: cleared bottom, however quickly sat back down, pt would benefit from +2 for safe transfers.      Balance Overall balance assessment: Needs assistance Sitting-balance support: Bilateral upper extremity supported, Feet supported Sitting balance-Leahy Scale: Fair Sitting balance - Comments: unable to lean outside of base of support   Standing balance support: Bilateral upper extremity supported, Reliant on assistive device for balance, During functional activity Standing balance-Leahy Scale: Poor Standing balance comment: unable to maintain upright posture without external assist                           ADL either performed or assessed with clinical judgement   ADL Overall ADL's : Needs assistance/impaired Eating/Feeding: Set up;Sitting   Grooming: Minimal assistance;Sitting   Upper Body Bathing: Minimal assistance;Moderate assistance;Sitting   Lower Body Bathing: Maximal assistance;+2 for safety/equipment;Sitting/lateral leans;Sit to/from stand   Upper Body Dressing : Sitting;Minimal assistance   Lower Body Dressing: Maximal assistance;+2 for safety/equipment;Sitting/lateral leans;Sit to/from stand   Toilet Transfer: Maximal assistance;+2 for physical assistance;+2 for safety/equipment;Stand-pivot   Toileting- Clothing Manipulation and Hygiene: Maximal assistance;+2 for safety/equipment;Sitting/lateral lean;Sit to/from stand       Functional mobility during ADLs: Maximal assistance;+2 for safety/equipment;+2 for physical assistance;Rolling walker (2 wheels) General ADL Comments: Heavy assist when attempting to lean forward and stand.     Vision Baseline Vision/History: 1 Wears glasses Ability  to See in Adequate Light: 0 Adequate Patient Visual  Report: No change from baseline Vision Assessment?: No apparent visual deficits     Perception         Praxis         Pertinent Vitals/Pain Pain Assessment Pain Assessment: 0-10 Pain Score: 4  Pain Location: neck Pain Descriptors / Indicators: Aching, Constant, Discomfort Pain Intervention(s): Limited activity within patient's tolerance, Monitored during session, Repositioned     Extremity/Trunk Assessment Upper Extremity Assessment Upper Extremity Assessment: Generalized weakness;LUE deficits/detail LUE Deficits / Details: LUE weaker than RUE, limited ROM LUE Coordination: decreased fine motor;decreased gross motor   Lower Extremity Assessment Lower Extremity Assessment: Defer to PT evaluation   Cervical / Trunk Assessment Cervical / Trunk Assessment: Kyphotic   Communication Communication Communication: No apparent difficulties Factors Affecting Communication: Hearing impaired   Cognition Arousal: Alert Behavior During Therapy: WFL for tasks assessed/performed Cognition: No family/caregiver present to determine baseline             OT - Cognition Comments: Pt continues to be confused with items around the room, such as the phone and call bell, stating they are different items. Slowed response to PLOF, and unsure of recent events, such as prior PT.                 Following commands: Intact       Cueing  General Comments   Cueing Techniques: Verbal cues;Gestural cues;Tactile cues      Exercises     Shoulder Instructions      Home Living Family/patient expects to be discharged to:: Private residence Living Arrangements: Children Available Help at Discharge: Family;Available 24 hours/day;Personal care attendant Type of Home: House Home Access: Ramped entrance     Home Layout: One level     Bathroom Shower/Tub: Producer, television/film/video: Standard Bathroom Accessibility: Yes How Accessible: Accessible via walker Home Equipment:  Rolling Walker (2 wheels);Wheelchair - manual;Shower seat;BSC/3in1;Grab bars - tub/shower          Prior Functioning/Environment Prior Level of Function : Needs assist             Mobility Comments: household ambulator using RW ADLs Comments: assisted by family    OT Problem List: Decreased strength;Decreased range of motion;Decreased activity tolerance;Impaired balance (sitting and/or standing);Decreased cognition;Decreased safety awareness;Decreased knowledge of use of DME or AE;Decreased knowledge of precautions;Cardiopulmonary status limiting activity;Impaired UE functional use   OT Treatment/Interventions: Self-care/ADL training;Therapeutic exercise;Energy conservation;DME and/or AE instruction;Manual therapy;Therapeutic activities;Patient/family education      OT Goals(Current goals can be found in the care plan section)   Acute Rehab OT Goals Patient Stated Goal: To go home OT Goal Formulation: With patient Time For Goal Achievement: 03/16/23 Potential to Achieve Goals: Good ADL Goals Pt Will Perform Grooming: with modified independence;sitting Pt Will Perform Lower Body Bathing: with min assist;sitting/lateral leans;sit to/from stand Pt Will Perform Lower Body Dressing: with min assist;sitting/lateral leans;sit to/from stand Pt Will Transfer to Toilet: with min assist;stand pivot transfer Pt Will Perform Toileting - Clothing Manipulation and hygiene: with min assist;sitting/lateral leans;sit to/from stand   OT Frequency:  Min 2X/week    Co-evaluation              AM-PAC OT "6 Clicks" Daily Activity     Outcome Measure Help from another person eating meals?: A Little Help from another person taking care of personal grooming?: A Little Help from another person toileting, which includes using toliet, bedpan, or urinal?: A Lot  Help from another person bathing (including washing, rinsing, drying)?: A Lot Help from another person to put on and taking off regular  upper body clothing?: A Lot Help from another person to put on and taking off regular lower body clothing?: A Lot 6 Click Score: 14   End of Session Equipment Utilized During Treatment: Rolling walker (2 wheels);Oxygen Nurse Communication: Mobility status  Activity Tolerance: Patient limited by fatigue Patient left: in chair;with call bell/phone within reach;with chair alarm set  OT Visit Diagnosis: Unsteadiness on feet (R26.81);Other abnormalities of gait and mobility (R26.89);Muscle weakness (generalized) (M62.81)                Time: 1610-9604 OT Time Calculation (min): 17 min Charges:  OT General Charges $OT Visit: 1 Visit OT Evaluation $OT Eval Moderate Complexity: 1 Mod  Kedarius Aloisi, OTR/L [facility] Acute Rehab  Valencia Kassa Elane Bing Plume 03/02/2023, 10:44 AM

## 2023-03-02 NOTE — Progress Notes (Addendum)
 Report given to [facility], LPN no further questions at this time.

## 2023-03-02 NOTE — Progress Notes (Signed)
Foley catheter removed, patient tolerated well. 

## 2023-03-02 NOTE — Evaluation (Signed)
 Clinical/Bedside Swallow Evaluation Patient Details  Name: Beverly [name] MRN: [id number] Date of Birth: 16-Nov-1932  Today's Date: 03/02/2023 Time: SLP Start Time (ACUTE ONLY): 1402 SLP Stop Time (ACUTE ONLY): 1426 SLP Time Calculation (min) (ACUTE ONLY): 24 min  Past Medical History:  Past Medical History:  Diagnosis Date   Anxiety    Arthritis    COPD (chronic obstructive pulmonary disease) (HCC)    Coronary artery disease    a. STEMI with cardiac arrest (polymorphic VT) s/p DES to RCA.   GERD (gastroesophageal reflux disease)    Headache(784.0)    Hearing loss, central    Left ear   Hematoma    Hypertension    Memory changes    Mild aortic stenosis    Mild carotid artery disease (HCC)    a. 1-39% by duplex 02/2018.   Seizures (HCC) 10/2012   No seizures, speech issues; after a fall, hitting head on left side   UTI (urinary tract infection)    June 2022   Past Surgical History:  Past Surgical History:  Procedure Laterality Date   ABDOMINAL HYSTERECTOMY     APPENDECTOMY     CARDIAC CATHETERIZATION     15 yrs ago   CORONARY/GRAFT ACUTE MI REVASCULARIZATION N/A 01/10/2018   Procedure: Coronary/Graft Acute MI Revascularization;  Surgeon: Runell Gess, MD;  Location: [facility] INVASIVE CV LAB;  Service: Cardiovascular;  Laterality: N/A;   CRANIOTOMY Left 10/30/2012   Procedure: CRANIOTOMY HEMATOMA EVACUATION SUBDURAL;  Surgeon: Maeola Harman, MD;  Location: [facility] NEURO ORS;  Service: Neurosurgery;  Laterality: Left;  Left Craniotomy for evacuation of subdural hematoma   JOINT REPLACEMENT Bilateral    knees   LEFT HEART CATH AND CORONARY ANGIOGRAPHY N/A 01/10/2018   Procedure: LEFT HEART CATH AND CORONARY ANGIOGRAPHY;  Surgeon: Runell Gess, MD;  Location: [facility] INVASIVE CV LAB;  Service: Cardiovascular;  Laterality: N/A;   HPI:  Beverly [name]  is a 89 y.o. female,with medical history significant for STEMI, Cardiac arrest, COPD, coronary artery disease, subdural hematoma, hypertension,  seizures.  Patient presented to ED due to progressive weakness, patient is currently somnolent, family assisting with the history, daughter and granddaughter at the bedside, patient with increased confusion, increased lethargy, more confused, this is progressive over the last 2 weeks.  She was admitted for evaluation of acute encephalopathy-multifactorial with noted hypercapnia.  She has not required BiPAP overnight and mentation has improved considerably.  She remains on IV antibiotics for multifocal pneumonia related to possible aspiration. CT chest also shows Esophagus is  decompressed. There is a large esophageal hiatal hernia containing  much of the stomach. BSE requested.    Assessment / Plan / Recommendation  Clinical Impression  Clinical swallow evaluation completed with Pt while seated up in her chair. Pt reports occasional "strangling" with liquids, but very rare. Oral motor examination is WNL. Pt does have kyphosis and CT chest shows hiatal hernia. Pt consumes all her meals seated in a chair at home and often sleeps in a recliner. Pt assessed with ice chips, thin water via cup/straw, puree, and graham crackers self presented. Pt does not exhibit any signs of aspiration and no reports of globus. She does endorse recent "heartburn" but this is atypical for her. Pt avoids salads and raw apples due to difficulty chewing, but generally consumes regular textures and needs someone to help cut up her meats and solids due to arthritis in her hands. Recommend regular textures with someone assisting to cut up her meats and thin  liquids with standard aspiration and reflux precautions. Pt prefers to take her medication crushed as able in puree. Pt may have some esophageal dysphagia due to advanced age, hiatal hernia noted on CT, and thoracic kyphosis. Pt was encouraged to consume all meals upright in chair and remain upright for at least 30 minutes after and consider smaller, more frequent meals rather than large  meals (Pt reports early satiety). No further SLP services indicated at this time. Reconsult if the need arises. SLP Visit Diagnosis: Dysphagia, unspecified (R13.10)    Aspiration Risk  Mild aspiration risk    Diet Recommendation Regular;Thin liquid    Liquid Administration via: Cup;Straw Medication Administration: Crushed with puree Supervision: Patient able to self feed Compensations: Slow rate Postural Changes: Seated upright at 90 degrees;Remain upright for at least 30 minutes after po intake    Other  Recommendations Oral Care Recommendations: Oral care BID;Staff/trained caregiver to provide oral care    Recommendations for follow up therapy are one component of a multi-disciplinary discharge planning process, led by the attending physician.  Recommendations may be updated based on patient status, additional functional criteria and insurance authorization.  Follow up Recommendations No SLP follow up      Assistance Recommended at Discharge    Functional Status Assessment Patient has had a recent decline in their functional status and demonstrates the ability to make significant improvements in function in a reasonable and predictable amount of time.  Frequency and Duration            Prognosis        Swallow Study   General Date of Onset: 03/01/23 HPI: Beverly [name]  is a 89 y.o. female,with medical history significant for STEMI, Cardiac arrest, COPD, coronary artery disease, subdural hematoma, hypertension, seizures.  Patient presented to ED due to progressive weakness, patient is currently somnolent, family assisting with the history, daughter and granddaughter at the bedside, patient with increased confusion, increased lethargy, more confused, this is progressive over the last 2 weeks.  She was admitted for evaluation of acute encephalopathy-multifactorial with noted hypercapnia.  She has not required BiPAP overnight and mentation has improved considerably.  She remains on IV  antibiotics for multifocal pneumonia related to possible aspiration. CT chest also shows Esophagus is  decompressed. There is a large esophageal hiatal hernia containing  much of the stomach. BSE requested. Type of Study: Bedside Swallow Evaluation Previous Swallow Assessment: N/A Diet Prior to this Study: Regular;Thin liquids (Level 0) Temperature Spikes Noted: No Respiratory Status: Nasal cannula History of Recent Intubation: No Behavior/Cognition: Alert;Cooperative;Pleasant mood Oral Cavity Assessment: Within Functional Limits Oral Care Completed by SLP: Recent completion by staff Oral Cavity - Dentition: Adequate natural dentition Vision: Functional for self-feeding Self-Feeding Abilities: Able to feed self Patient Positioning: Upright in chair Baseline Vocal Quality: Normal Volitional Cough: Strong Volitional Swallow: Able to elicit    Oral/Motor/Sensory Function Overall Oral Motor/Sensory Function: Within functional limits   Ice Chips Ice chips: Within functional limits Presentation: Spoon   Thin Liquid Thin Liquid: Within functional limits Presentation: Cup;Self Fed;Straw    Nectar Thick Nectar Thick Liquid: Not tested   Honey Thick Honey Thick Liquid: Not tested   Puree Puree: Within functional limits Presentation: Spoon   Solid     Solid: Within functional limits Presentation: Self Fed     Thank you,  Havery Moros, CCC-SLP [phone]  Lutie Pickler 03/02/2023,3:02 PM

## 2023-03-03 ENCOUNTER — Encounter: Payer: Self-pay | Admitting: Internal Medicine

## 2023-03-03 ENCOUNTER — Non-Acute Institutional Stay (SKILLED_NURSING_FACILITY): Payer: Self-pay | Admitting: Internal Medicine

## 2023-03-03 DIAGNOSIS — H353221 Exudative age-related macular degeneration, left eye, with active choroidal neovascularization: Secondary | ICD-10-CM | POA: Diagnosis not present

## 2023-03-03 DIAGNOSIS — J9601 Acute respiratory failure with hypoxia: Secondary | ICD-10-CM | POA: Diagnosis not present

## 2023-03-03 DIAGNOSIS — E871 Hypo-osmolality and hyponatremia: Secondary | ICD-10-CM | POA: Diagnosis not present

## 2023-03-03 DIAGNOSIS — G9341 Metabolic encephalopathy: Secondary | ICD-10-CM | POA: Diagnosis not present

## 2023-03-03 DIAGNOSIS — G40109 Localization-related (focal) (partial) symptomatic epilepsy and epileptic syndromes with simple partial seizures, not intractable, without status epilepticus: Secondary | ICD-10-CM | POA: Diagnosis not present

## 2023-03-03 DIAGNOSIS — D649 Anemia, unspecified: Secondary | ICD-10-CM | POA: Diagnosis not present

## 2023-03-03 DIAGNOSIS — H353114 Nonexudative age-related macular degeneration, right eye, advanced atrophic with subfoveal involvement: Secondary | ICD-10-CM | POA: Diagnosis not present

## 2023-03-03 DIAGNOSIS — A498 Other bacterial infections of unspecified site: Secondary | ICD-10-CM | POA: Diagnosis not present

## 2023-03-03 DIAGNOSIS — H40053 Ocular hypertension, bilateral: Secondary | ICD-10-CM | POA: Diagnosis not present

## 2023-03-03 DIAGNOSIS — I700 Atherosclerosis of aorta: Secondary | ICD-10-CM | POA: Diagnosis not present

## 2023-03-03 DIAGNOSIS — R1312 Dysphagia, oropharyngeal phase: Secondary | ICD-10-CM | POA: Diagnosis not present

## 2023-03-03 DIAGNOSIS — Z9181 History of falling: Secondary | ICD-10-CM | POA: Diagnosis not present

## 2023-03-03 DIAGNOSIS — G8929 Other chronic pain: Secondary | ICD-10-CM

## 2023-03-03 DIAGNOSIS — K219 Gastro-esophageal reflux disease without esophagitis: Secondary | ICD-10-CM | POA: Diagnosis not present

## 2023-03-03 DIAGNOSIS — G3184 Mild cognitive impairment, so stated: Secondary | ICD-10-CM | POA: Diagnosis not present

## 2023-03-03 DIAGNOSIS — E785 Hyperlipidemia, unspecified: Secondary | ICD-10-CM | POA: Diagnosis not present

## 2023-03-03 DIAGNOSIS — R498 Other voice and resonance disorders: Secondary | ICD-10-CM | POA: Diagnosis not present

## 2023-03-03 DIAGNOSIS — J189 Pneumonia, unspecified organism: Secondary | ICD-10-CM | POA: Diagnosis not present

## 2023-03-03 DIAGNOSIS — M6281 Muscle weakness (generalized): Secondary | ICD-10-CM | POA: Diagnosis not present

## 2023-03-03 DIAGNOSIS — J690 Pneumonitis due to inhalation of food and vomit: Secondary | ICD-10-CM | POA: Diagnosis not present

## 2023-03-03 DIAGNOSIS — J42 Unspecified chronic bronchitis: Secondary | ICD-10-CM | POA: Diagnosis not present

## 2023-03-03 DIAGNOSIS — Z96653 Presence of artificial knee joint, bilateral: Secondary | ICD-10-CM | POA: Diagnosis not present

## 2023-03-03 DIAGNOSIS — Z741 Need for assistance with personal care: Secondary | ICD-10-CM | POA: Diagnosis not present

## 2023-03-03 DIAGNOSIS — H401131 Primary open-angle glaucoma, bilateral, mild stage: Secondary | ICD-10-CM | POA: Diagnosis not present

## 2023-03-03 DIAGNOSIS — R488 Other symbolic dysfunctions: Secondary | ICD-10-CM | POA: Diagnosis not present

## 2023-03-03 DIAGNOSIS — N390 Urinary tract infection, site not specified: Secondary | ICD-10-CM | POA: Diagnosis not present

## 2023-03-03 DIAGNOSIS — J449 Chronic obstructive pulmonary disease, unspecified: Secondary | ICD-10-CM | POA: Diagnosis not present

## 2023-03-03 DIAGNOSIS — E441 Mild protein-calorie malnutrition: Secondary | ICD-10-CM | POA: Diagnosis not present

## 2023-03-03 DIAGNOSIS — Z9189 Other specified personal risk factors, not elsewhere classified: Secondary | ICD-10-CM | POA: Diagnosis not present

## 2023-03-03 DIAGNOSIS — M159 Polyosteoarthritis, unspecified: Secondary | ICD-10-CM | POA: Diagnosis not present

## 2023-03-03 DIAGNOSIS — H353222 Exudative age-related macular degeneration, left eye, with inactive choroidal neovascularization: Secondary | ICD-10-CM | POA: Diagnosis not present

## 2023-03-03 DIAGNOSIS — I10 Essential (primary) hypertension: Secondary | ICD-10-CM | POA: Diagnosis not present

## 2023-03-03 DIAGNOSIS — I25119 Atherosclerotic heart disease of native coronary artery with unspecified angina pectoris: Secondary | ICD-10-CM | POA: Diagnosis not present

## 2023-03-03 DIAGNOSIS — R262 Difficulty in walking, not elsewhere classified: Secondary | ICD-10-CM | POA: Diagnosis not present

## 2023-03-03 LAB — URINE CULTURE: Culture: >=100000 — AB

## 2023-03-03 LAB — CBC
HCT: 38.6 % (ref 36.0–46.0)
Hemoglobin: 12.7 g/dL (ref 12.0–15.0)
MCH: 29.2 pg (ref 26.0–34.0)
MCHC: 32.9 g/dL (ref 30.0–36.0)
MCV: 88.7 fL (ref 80.0–100.0)
Platelets: 189 10*3/uL (ref 150–400)
RBC: 4.35 MIL/uL (ref 3.87–5.11)
RDW: 15.5 % (ref 11.5–15.5)
WBC: 12.7 10*3/uL — ABNORMAL HIGH (ref 4.0–10.5)
nRBC: 0 % (ref 0.0–0.2)

## 2023-03-03 LAB — BASIC METABOLIC PANEL
Anion gap: 7 (ref 5–15)
BUN: 17 mg/dL (ref 8–23)
CO2: 30 mmol/L (ref 22–32)
Calcium: 9.4 mg/dL (ref 8.9–10.3)
Chloride: 98 mmol/L (ref 98–111)
Creatinine, Ser: 0.79 mg/dL (ref 0.44–1.00)
GFR, Estimated: >60 mL/min (ref >60)
Glucose, Bld: 114 mg/dL — ABNORMAL HIGH (ref 70–99)
Potassium: 3.7 mmol/L (ref 3.5–5.1)
Sodium: 135 mmol/L (ref 135–145)

## 2023-03-03 LAB — MAGNESIUM: Magnesium: 1.7 mg/dL (ref 1.7–2.4)

## 2023-03-03 MED ORDER — HYDROCODONE-ACETAMINOPHEN 10-325 MG PO TABS
1.0000 | ORAL_TABLET | ORAL | 0 refills | Status: DC | PRN
Start: 2023-03-03 — End: 2023-03-15

## 2023-03-03 MED ORDER — ALPRAZOLAM 0.5 MG PO TABS: 0.5000 mg | ORAL_TABLET | Freq: Two times a day (BID) | ORAL | 0 refills | Status: DC | PRN

## 2023-03-03 MED ORDER — LEVOFLOXACIN 500 MG PO TABS: 500.0000 mg | ORAL_TABLET | Freq: Every day | ORAL | 0 refills | Status: AC

## 2023-03-03 MED ORDER — METRONIDAZOLE 500 MG PO TABS: 500.0000 mg | ORAL_TABLET | Freq: Two times a day (BID) | ORAL | 0 refills | Status: AC

## 2023-03-03 NOTE — Discharge Summary (Signed)
 Physician Discharge Summary  Beverly [name] RUE:[id number] DOB: [birth year] DOA: 03/01/2023  PCP: Benita Stabile, MD  Admit date: 03/01/2023  Discharge date: 03/03/2023  Admitted From:Home  Disposition:  SNF  Recommendations for Outpatient Follow-up:  Follow up with PCP in 1-2 weeks Remain on Levaquin and Flagyl as prescribed to complete course of treatment for suspected aspiration pneumonia as well as Enterobacter UTI Continue other home medications as prior and home muscle relaxer discontinued given issues with polypharmacy and sedation Continue to wean other sedating medications slowly per management by PCP  Home Health: None  Equipment/Devices: Nasal cannula oxygen  Discharge Condition:Stable  CODE STATUS: DNR  Diet recommendation: Heart Healthy  Brief/Interim Summary: Beverly [name]  is a 89 y.o. female,with medical history significant for STEMI, Cardiac arrest, COPD, coronary artery disease, subdural hematoma, hypertension, seizures. Patient presented to ED due to progressive weakness, patient is currently somnolent, family assisting with the history, daughter and granddaughter at the bedside, patient with increased confusion, increased lethargy, more confused, this is progressive over the last 2 weeks.  She was admitted for evaluation of acute encephalopathy-multifactorial with noted hypercapnia.  She has not required BiPAP overnight and mentation has improved considerably.  Her condition improved fairly quickly and she was moved out of the ICU on 2/27.  She continues to do well even with the use of much of her sedating home medications.  She may now transition to oral antibiotics with Levaquin and Flagyl as prescribed and has had speech and swallow evaluation with no significant findings noted.  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  Discharge Instructions  Discharge Instructions     Diet - low sodium heart healthy   Complete by: As directed    Increase activity slowly   Complete by: As directed    Urine Culture   Complete by: As directed       Allergies as of 03/03/2023       Reactions   Lisinopril Swelling   Egg-derived Products Itching   Patient reports that she can eat eggs but does not tolerate egg derived products   Morphine And Codeine    Headache   Penicillins Itching           Medication List     STOP taking these medications    methocarbamol 500 MG tablet Commonly known as: ROBAXIN       TAKE these medications    acetaminophen 500 MG tablet Commonly known as: TYLENOL Take 500 mg by mouth every 4 (four) hours.   ALPRAZolam 0.5 MG tablet Commonly known as: XANAX Take 1 tablet (0.5 mg total) by mouth 2 (two) times daily as needed for anxiety or sleep.   amLODipine 10 MG tablet Commonly known as: NORVASC Take 1 tablet (10 mg total) by mouth daily.   aspirin EC 81 MG tablet Take 1 tablet (81 mg total) by mouth daily with breakfast.   atorvastatin 80 MG tablet Commonly known as: LIPITOR Take 1 tablet (80 mg total) by mouth daily at 6 PM.   carvedilol 12.5 MG tablet Commonly known as: COREG Take 1 tablet (12.5 mg total) by mouth 2 (two)  times daily.   celecoxib 200 MG capsule Commonly known as: CELEBREX Take 1 capsule (200 mg total) by mouth 2 (two) times daily.   Cholecalciferol 25 MCG (1000 UT) Tbdp Take 1,000 Units by mouth daily.   COLON CLEANSE PO Take 1 capsule by mouth daily as needed (Constipation).   ENSURE ENLIVE PO Take 237 mLs by mouth daily.   famotidine 20 MG tablet Commonly known as:  PEPCID Take 20 mg by mouth daily.   fluticasone 50 MCG/ACT nasal spray Commonly known as: FLONASE Place 2 sprays into both nostrils daily.   gabapentin 100 MG capsule Commonly known as: NEURONTIN Take 1 capsule (100 mg total) by mouth at bedtime. What changed: how much to take   guaifenesin 100 MG/5ML syrup Commonly known as: ROBITUSSIN Take 100 mg by mouth 3 (three) times daily as needed for cough.   guaiFENesin 600 MG 12 hr tablet Commonly known as: Mucinex Take 1 tablet (600 mg total) by mouth 2 (two) times daily.   HYDROcodone-acetaminophen 10-325 MG tablet Commonly known as: NORCO Take 1 tablet by mouth every 4 (four) hours as needed for moderate pain (pain score 4-6) or severe pain (pain score 7-10). What changed:  when to take this reasons to take this   latanoprost 0.005 % ophthalmic solution Commonly known as: XALATAN INSTILL ONE DROP IN BOTH EYES NIGHTLY   levETIRAcetam 500 MG tablet Commonly known as: KEPPRA Take 1 tablet (500 mg total) by mouth 2 (two) times daily.   levofloxacin 500 MG tablet Commonly known as: LEVAQUIN Take 1 tablet (500 mg total) by mouth daily for 5 days.   memantine 5 MG tablet Commonly known as: NAMENDA Take 1 tablet (5 mg total) by mouth 2 (two) times daily.   metroNIDAZOLE 500 MG tablet Commonly known as: Flagyl Take 1 tablet (500 mg total) by mouth 2 (two) times daily for 5 days.   nitroGLYCERIN 0.4 MG SL tablet Commonly known as: NITROSTAT Place 1 tablet (0.4 mg total) under the tongue every 5 (five) minutes x 3 doses as needed for chest pain.   Nyamyc powder Generic drug: nystatin Apply 1 Application topically 2 (two) times daily as needed (Irritation).   pantoprazole 40 MG tablet Commonly known as: PROTONIX Take 1 tablet (40 mg total) by mouth daily.   risperiDONE 0.5 MG tablet Commonly known as: RISPERDAL Take 1 tablet (0.5 mg total) by mouth 2 (two) times daily.   sertraline 50 MG tablet Commonly known as:  ZOLOFT Take 1 tablet (50 mg total) by mouth daily.   SYSTANE BALANCE OP Place 2 drops into both eyes daily as needed (dry eyes).   UNABLE TO FIND Med Name: Luiz Iron information for follow-up providers     Benita Stabile, MD. Schedule an appointment as soon as possible for a visit in 1 week(s).   Specialty: Internal Medicine Contact information: [address] [place] [address] [phone]              Contact information for after-discharge care     Destination     [facility] .   Service: Skilled Nursing Contact information: [address] [place] [address] [phone]                    Allergies  Allergen Reactions   Lisinopril Swelling   Egg-Derived Products Itching    Patient reports that she can eat eggs but  does not tolerate egg derived products   Morphine And Codeine     Headache   Penicillins Itching         Consultations: None   Procedures/Studies: CT Chest W Contrast Result Date: 03/01/2023 CLINICAL DATA:  Respiratory illness with nondiagnostic x-ray. EXAM: CT CHEST WITH CONTRAST TECHNIQUE: Multidetector CT imaging of the chest was performed during intravenous contrast administration. RADIATION DOSE REDUCTION: This exam was performed according to the departmental dose-optimization program which includes automated exposure control, adjustment of the mA and/or kV according to patient size and/or use of iterative reconstruction technique. CONTRAST:  75mL OMNIPAQUE IOHEXOL 300 MG/ML  SOLN COMPARISON:  Chest radiograph 03/01/2023.  CT chest 05/09/2020 FINDINGS: Cardiovascular: Cardiac enlargement. No pericardial effusions. Normal caliber thoracic aorta. Scattered calcification. No dissection. Although the contrast bolus is limited for evaluation of pulmonary arteries. No large central pulmonary embolus is identified. Mediastinum/Nodes: Thyroid gland is unremarkable. Esophagus is  decompressed. There is a large esophageal hiatal hernia containing much of the stomach. Mediastinal lymphadenopathy. Largest subcarinal nodes measure up to 1.4 cm short axis dimension. This is similar to the prior study, likely representing reactive change. Lungs/Pleura: Motion artifact limits evaluation. There is consolidation or atelectasis in both lung bases. There is diffuse interstitial infiltration with patchy airspace disease in both lungs. This appearance is most likely represent multifocal pneumonia or edema. Peribronchial thickening with some mucous plugging suggesting acute or chronic bronchitis. Aspiration would be a secondary consideration. No pleural effusion or pneumothorax. Upper Abdomen: No acute abnormalities. Musculoskeletal: Degenerative changes in the spine. Old fracture deformity of the sternum. No acute bony abnormalities are identified. IMPRESSION: 1. Cardiac enlargement. 2. Diffuse airspace and interstitial disease in the lungs with atelectasis or consolidation in the lung bases and diffuse bronchitic changes. Appearances are most likely to represent multifocal pneumonia or edema. Aspiration could also be a consideration in the appropriate clinical setting. 3. Large esophageal hiatal hernia. 4. Aortic atherosclerosis. Electronically Signed   By: Burman Nieves M.D.   On: 03/01/2023 15:56   DG Chest Port 1 View Result Date: 03/01/2023 CLINICAL DATA:  Cough and weakness. EXAM: PORTABLE CHEST 1 VIEW COMPARISON:  Chest radiograph dated 07/14/2022. FINDINGS: Patient is rotated to the left. The heart is borderline enlarged. Mediastinal contours are within normal limits. Moderate hiatal hernia. Similar chronic elevation of the right hemidiaphragm. Indistinctness of the left costophrenic angle with suspected left basilar atelectasis. Diffuse bilateral interstitial prominence and coarsening. No acute osseous abnormality. IMPRESSION: 1. Limited examination demonstrating suspected small left  pleural effusion with left basilar subsegmental atelectasis. 2. Diffuse interstitial prominence and coarsening could reflect chronic changes, edema, or atypical infection. Electronically Signed   By: Hart Robinsons M.D.   On: 03/01/2023 12:15   CT Head Wo Contrast Result Date: 03/01/2023 CLINICAL DATA:  Altered mental status, weakness for past couple of days. EXAM: CT HEAD WITHOUT CONTRAST TECHNIQUE: Contiguous axial images were obtained from the base of the skull through the vertex without intravenous contrast. RADIATION DOSE REDUCTION: This exam was performed according to the departmental dose-optimization program which includes automated exposure control, adjustment of the mA and/or kV according to patient size and/or use of iterative reconstruction technique. COMPARISON:  CT head 07/15/2022 FINDINGS: Brain: No acute intracranial hemorrhage. No CT evidence of acute infarct. Nonspecific hypoattenuation in the periventricular and subcortical white matter favored to reflect chronic microvascular ischemic changes. Remote lacunar infarcts in the basal ganglia. No edema, mass effect, or midline shift. The basilar cisterns are patent. Ventricles: Prominence of the ventricles  suggesting underlying parenchymal volume loss. Vascular: Atherosclerosis of the carotid siphons. No hyperdense vessel. Skull: Postsurgical changes of left frontal craniotomy. No acute or aggressive finding. Orbits: Right lens replacement.  Orbits otherwise symmetric. Sinuses: The visualized paranasal sinuses are clear. Other: Mastoid air cells are clear. IMPRESSION: No CT evidence of acute intracranial abnormality. Mild chronic microvascular ischemic changes and parenchymal volume loss. Remote lacunar infarcts in the basal ganglia. Postsurgical changes of left frontal craniotomy. Electronically Signed   By: Emily Filbert M.D.   On: 03/01/2023 11:46     Discharge Exam: Vitals:   03/02/23 2050 03/03/23 0524  BP: 130/68 (!) 170/85  Pulse:  88 94  Resp: 20 20  Temp: 98 F (36.7 C) 98.6 F (37 C)  SpO2: 95% 94%   Vitals:   03/02/23 1400 03/02/23 1505 03/02/23 2050 03/03/23 0524  BP: 139/61 111/86 130/68 (!) 170/85  Pulse: 96 89 88 94  Resp: 17  20 20   Temp:  98.2 F (36.8 C) 98 F (36.7 C) 98.6 F (37 C)  TempSrc:   Oral Oral  SpO2: 96% 93% 95% 94%  Weight:      Height:        General: Pt is alert, awake, not in acute distress Cardiovascular: RRR, S1/S2 +, no rubs, no gallops Respiratory: CTA bilaterally, no wheezing, no rhonchi, nasal cannula Abdominal: Soft, NT, ND, bowel sounds + Extremities: no edema, no cyanosis    The results of significant diagnostics from this hospitalization (including imaging, microbiology, ancillary and laboratory) are listed below for reference.     Microbiology: Recent Results (from the past 240 hours)  Resp panel by RT-PCR (RSV, Flu A&B, Covid) Urine, Clean Catch     Status: None   Collection Time: 03/01/23 10:32 AM   Specimen: Urine, Clean Catch; Nasal Swab  Result Value Ref Range Status   SARS Coronavirus 2 by RT PCR NEGATIVE NEGATIVE Final    Comment: (NOTE) SARS-CoV-2 target nucleic acids are NOT DETECTED.  The SARS-CoV-2 RNA is generally detectable in upper respiratory specimens during the acute phase of infection. The lowest concentration of SARS-CoV-2 viral copies this assay can detect is 138 copies/mL. A negative result does not preclude SARS-Cov-2 infection and should not be used as the sole basis for treatment or other patient management decisions. A negative result may occur with  improper specimen collection/handling, submission of specimen other than nasopharyngeal swab, presence of viral mutation(s) within the areas targeted by this assay, and inadequate number of viral copies(<138 copies/mL). A negative result must be combined with clinical observations, patient history, and epidemiological information. The expected result is Negative.  Fact Sheet for  Patients:  BloggerCourse.com  Fact Sheet for Healthcare Providers:  SeriousBroker.it  This test is no t yet approved or cleared by the [place] FDA and  has been authorized for detection and/or diagnosis of SARS-CoV-2 by FDA under an Emergency Use Authorization (EUA). This EUA will remain  in effect (meaning this test can be used) for the duration of the COVID-19 declaration under Section 564(b)(1) of the Act, 21 U.S.C.section 360bbb-3(b)(1), unless the authorization is terminated  or revoked sooner.       Influenza A by PCR NEGATIVE NEGATIVE Final   Influenza B by PCR NEGATIVE NEGATIVE Final    Comment: (NOTE) The Xpert Xpress SARS-CoV-2/FLU/RSV plus assay is intended as an aid in the diagnosis of influenza from Nasopharyngeal swab specimens and should not be used as a sole basis for treatment. Nasal washings and aspirates are  unacceptable for Xpert Xpress SARS-CoV-2/FLU/RSV testing.  Fact Sheet for Patients: BloggerCourse.com  Fact Sheet for Healthcare Providers: SeriousBroker.it  This test is not yet approved or cleared by the [place] FDA and has been authorized for detection and/or diagnosis of SARS-CoV-2 by FDA under an Emergency Use Authorization (EUA). This EUA will remain in effect (meaning this test can be used) for the duration of the COVID-19 declaration under Section 564(b)(1) of the Act, 21 U.S.C. section 360bbb-3(b)(1), unless the authorization is terminated or revoked.     Resp Syncytial Virus by PCR NEGATIVE NEGATIVE Final    Comment: (NOTE) Fact Sheet for Patients: BloggerCourse.com  Fact Sheet for Healthcare Providers: SeriousBroker.it  This test is not yet approved or cleared by the [place] FDA and has been authorized for detection and/or diagnosis of SARS-CoV-2 by FDA under an Emergency  Use Authorization (EUA). This EUA will remain in effect (meaning this test can be used) for the duration of the COVID-19 declaration under Section 564(b)(1) of the Act, 21 U.S.C. section 360bbb-3(b)(1), unless the authorization is terminated or revoked.  Performed at [facility], [address], [place], [place] [address]   Urine Culture     Status: Abnormal   Collection Time: 03/01/23 10:35 AM   Specimen: Urine, Random  Result Value Ref Range Status   Specimen Description   Final    URINE, RANDOM Performed at [facility], [address] [place], [place] [address]    Special Requests   Final    NONE Performed at [facility], [address] [place], [place] [address]    Culture >=100,000 COLONIES/mL ENTEROBACTER CLOACAE (A)  Final   Report Status 03/03/2023 FINAL  Final   Organism ID, Bacteria ENTEROBACTER CLOACAE (A)  Final      Susceptibility   Enterobacter cloacae - MIC*    CEFEPIME 2 SENSITIVE Sensitive     CIPROFLOXACIN <=0.25 SENSITIVE Sensitive     GENTAMICIN <=1 SENSITIVE Sensitive     IMIPENEM 1 SENSITIVE Sensitive     NITROFURANTOIN 64 INTERMEDIATE Intermediate     TRIMETH/SULFA <=20 SENSITIVE Sensitive     PIP/TAZO >=128 RESISTANT Resistant ug/mL    * >=100,000 COLONIES/mL ENTEROBACTER CLOACAE  Blood culture (routine x 2)     Status: None (Preliminary result)   Collection Time: 03/01/23 10:41 AM   Specimen: BLOOD  Result Value Ref Range Status   Specimen Description BLOOD LEFT ASSIST CONTROL  Final   Special Requests   Final    BOTTLES DRAWN AEROBIC AND ANAEROBIC Blood Culture adequate volume   Culture   Final    NO GROWTH 2 DAYS Performed at [facility], [address] [place], [place] [address]    Report Status PENDING  Incomplete  Blood culture (routine x 2)     Status: None (Preliminary result)   Collection Time: 03/01/23 11:58 AM   Specimen: BLOOD  Result Value Ref Range Status   Specimen Description BLOOD BLOOD RIGHT HAND  Final   Special Requests    Final    BOTTLES DRAWN AEROBIC ONLY Blood Culture results may not be optimal due to an inadequate volume of blood received in culture bottles   Culture   Final    NO GROWTH 2 DAYS Performed at [facility], [address], [place], [place] [address]    Report Status PENDING  Incomplete  MRSA Next Gen by PCR, Nasal     Status: None   Collection Time: 03/01/23  8:00 PM   Specimen: Nasal Mucosa;  Nasal Swab  Result Value Ref Range Status   MRSA by PCR Next Gen NOT DETECTED NOT DETECTED Final    Comment: (NOTE) The GeneXpert MRSA Assay (FDA approved for NASAL specimens only), is one component of a comprehensive MRSA colonization surveillance program. It is not intended to diagnose MRSA infection nor to guide or monitor treatment for MRSA infections. Test performance is not FDA approved in patients less than 7 years old. Performed at [facility], [address], [place], [place] [address]      Labs: BNP (last 3 results) Recent Labs    07/14/22 1702 03/01/23 1042  BNP 154.0* 82.0   Basic Metabolic Panel: Recent Labs  Lab 03/01/23 1042 03/02/23 0409 03/03/23 0425  NA 135 136 135  K 4.5 4.5 3.7  CL 95* 97* 98  CO2 32 28 30  GLUCOSE 96 133* 114*  BUN 18 17 17   CREATININE 0.95 0.69 0.79  CALCIUM 9.5 9.2 9.4  MG  --   --  1.7   Liver Function Tests: Recent Labs  Lab 03/01/23 1042  AST 15  ALT 10  ALKPHOS 73  BILITOT 0.8  PROT 7.1  ALBUMIN 3.5   No results for input(s): "LIPASE", "AMYLASE" in the last 168 hours. No results for input(s): "AMMONIA" in the last 168 hours. CBC: Recent Labs  Lab 03/01/23 1042 03/02/23 0409 03/03/23 0425  WBC 9.0 7.7 12.7*  NEUTROABS 5.2  --   --   HGB 12.8 12.0 12.7  HCT 39.9 36.8 38.6  MCV 92.1 91.5 88.7  PLT 159 158 189   Cardiac Enzymes: Recent Labs  Lab 03/01/23 1042  CKTOTAL 22*   BNP: Invalid input(s): "POCBNP" CBG: Recent Labs  Lab 03/01/23 1037  GLUCAP 97   D-Dimer No results for input(s): "DDIMER" in the  last 72 hours. Hgb A1c No results for input(s): "HGBA1C" in the last 72 hours. Lipid Profile No results for input(s): "CHOL", "HDL", "LDLCALC", "TRIG", "CHOLHDL", "LDLDIRECT" in the last 72 hours. Thyroid function studies Recent Labs    03/01/23 1042  TSH 4.007   Anemia work up No results for input(s): "VITAMINB12", "FOLATE", "FERRITIN", "TIBC", "IRON", "RETICCTPCT" in the last 72 hours. Urinalysis    Component Value Date/Time   COLORURINE YELLOW 03/01/2023 1032   APPEARANCEUR HAZY (A) 03/01/2023 1032   APPEARANCEUR Clear 02/21/2022 1533   LABSPEC 1.014 03/01/2023 1032   PHURINE 5.0 03/01/2023 1032   GLUCOSEU NEGATIVE 03/01/2023 1032   HGBUR NEGATIVE 03/01/2023 1032   BILIRUBINUR NEGATIVE 03/01/2023 1032   BILIRUBINUR Negative 02/21/2022 1533   KETONESUR NEGATIVE 03/01/2023 1032   PROTEINUR NEGATIVE 03/01/2023 1032   UROBILINOGEN 0.2 10/18/2012 1944   NITRITE POSITIVE (A) 03/01/2023 1032   LEUKOCYTESUR SMALL (A) 03/01/2023 1032   Sepsis Labs Recent Labs  Lab 03/01/23 1042 03/02/23 0409 03/03/23 0425  WBC 9.0 7.7 12.7*   Microbiology Recent Results (from the past 240 hours)  Resp panel by RT-PCR (RSV, Flu A&B, Covid) Urine, Clean Catch     Status: None   Collection Time: 03/01/23 10:32 AM   Specimen: Urine, Clean Catch; Nasal Swab  Result Value Ref Range Status   SARS Coronavirus 2 by RT PCR NEGATIVE NEGATIVE Final    Comment: (NOTE) SARS-CoV-2 target nucleic acids are NOT DETECTED.  The SARS-CoV-2 RNA is generally detectable in upper respiratory specimens during the acute phase of infection. The lowest concentration of SARS-CoV-2 viral copies this assay can detect is 138 copies/mL. A negative result does not preclude SARS-Cov-2 infection and  should not be used as the sole basis for treatment or other patient management decisions. A negative result may occur with  improper specimen collection/handling, submission of specimen other than nasopharyngeal swab,  presence of viral mutation(s) within the areas targeted by this assay, and inadequate number of viral copies(<138 copies/mL). A negative result must be combined with clinical observations, patient history, and epidemiological information. The expected result is Negative.  Fact Sheet for Patients:  BloggerCourse.com  Fact Sheet for Healthcare Providers:  SeriousBroker.it  This test is no t yet approved or cleared by the [place] FDA and  has been authorized for detection and/or diagnosis of SARS-CoV-2 by FDA under an Emergency Use Authorization (EUA). This EUA will remain  in effect (meaning this test can be used) for the duration of the COVID-19 declaration under Section 564(b)(1) of the Act, 21 U.S.C.section 360bbb-3(b)(1), unless the authorization is terminated  or revoked sooner.       Influenza A by PCR NEGATIVE NEGATIVE Final   Influenza B by PCR NEGATIVE NEGATIVE Final    Comment: (NOTE) The Xpert Xpress SARS-CoV-2/FLU/RSV plus assay is intended as an aid in the diagnosis of influenza from Nasopharyngeal swab specimens and should not be used as a sole basis for treatment. Nasal washings and aspirates are unacceptable for Xpert Xpress SARS-CoV-2/FLU/RSV testing.  Fact Sheet for Patients: BloggerCourse.com  Fact Sheet for Healthcare Providers: SeriousBroker.it  This test is not yet approved or cleared by the [place] FDA and has been authorized for detection and/or diagnosis of SARS-CoV-2 by FDA under an Emergency Use Authorization (EUA). This EUA will remain in effect (meaning this test can be used) for the duration of the COVID-19 declaration under Section 564(b)(1) of the Act, 21 U.S.C. section 360bbb-3(b)(1), unless the authorization is terminated or revoked.     Resp Syncytial Virus by PCR NEGATIVE NEGATIVE Final    Comment: (NOTE) Fact Sheet for  Patients: BloggerCourse.com  Fact Sheet for Healthcare Providers: SeriousBroker.it  This test is not yet approved or cleared by the [place] FDA and has been authorized for detection and/or diagnosis of SARS-CoV-2 by FDA under an Emergency Use Authorization (EUA). This EUA will remain in effect (meaning this test can be used) for the duration of the COVID-19 declaration under Section 564(b)(1) of the Act, 21 U.S.C. section 360bbb-3(b)(1), unless the authorization is terminated or revoked.  Performed at [facility], [address] [place], [place] [address]   Urine Culture     Status: Abnormal   Collection Time: 03/01/23 10:35 AM   Specimen: Urine, Random  Result Value Ref Range Status   Specimen Description   Final    URINE, RANDOM Performed at [facility], [address] [place], [place] [address]    Special Requests   Final    NONE Performed at [facility], [address], [place], [place] [address]    Culture >=100,000 COLONIES/mL ENTEROBACTER CLOACAE (A)  Final   Report Status 03/03/2023 FINAL  Final   Organism ID, Bacteria ENTEROBACTER CLOACAE (A)  Final      Susceptibility   Enterobacter cloacae - MIC*    CEFEPIME 2 SENSITIVE Sensitive     CIPROFLOXACIN <=0.25 SENSITIVE Sensitive     GENTAMICIN <=1 SENSITIVE Sensitive     IMIPENEM 1 SENSITIVE Sensitive     NITROFURANTOIN 64 INTERMEDIATE Intermediate     TRIMETH/SULFA <=20 SENSITIVE Sensitive     PIP/TAZO >=128 RESISTANT Resistant ug/mL    * >=100,000 COLONIES/mL ENTEROBACTER CLOACAE  Blood culture (routine  x 2)     Status: None (Preliminary result)   Collection Time: 03/01/23 10:41 AM   Specimen: BLOOD  Result Value Ref Range Status   Specimen Description BLOOD LEFT ASSIST CONTROL  Final   Special Requests   Final    BOTTLES DRAWN AEROBIC AND ANAEROBIC Blood Culture adequate volume   Culture   Final    NO GROWTH 2 DAYS Performed at [facility], [address], [place], [place] [address]    Report Status PENDING  Incomplete  Blood culture (routine x 2)     Status: None (Preliminary result)   Collection Time: 03/01/23 11:58 AM   Specimen: BLOOD  Result Value Ref Range Status   Specimen Description BLOOD BLOOD RIGHT HAND  Final   Special Requests   Final    BOTTLES DRAWN AEROBIC ONLY Blood Culture results may not be optimal due to an inadequate volume of blood received in culture bottles   Culture   Final    NO GROWTH 2 DAYS Performed at [facility], [address] [place], [place] [address]    Report Status PENDING  Incomplete  MRSA Next Gen by PCR, Nasal     Status: None   Collection Time: 03/01/23  8:00 PM   Specimen: Nasal Mucosa; Nasal Swab  Result Value Ref Range Status   MRSA by PCR Next Gen NOT DETECTED NOT DETECTED Final    Comment: (NOTE) The GeneXpert MRSA Assay (FDA approved for NASAL specimens only), is one component of a comprehensive MRSA colonization surveillance program. It is not intended to diagnose MRSA infection nor to guide or monitor treatment for MRSA infections. Test performance is not FDA approved in patients less than 52 years old. Performed at [facility], [address], [place], [place] [address]      Time coordinating discharge: 35 minutes  SIGNED:   Erick Blinks, DO [facility] 03/03/2023, 11:16 AM  If 7PM-7AM, please contact night-coverage [email or web address]

## 2023-03-03 NOTE — Care Management Important Message (Signed)
 Important Message  Patient Details  Name: Beverly [name] MRN: [id number] Date of Birth: 1932-05-06   Important Message Given:  N/A - LOS <3 / Initial given by admissions     Corey Harold 03/03/2023, 10:26 AM

## 2023-03-03 NOTE — Assessment & Plan Note (Addendum)
 O2 sats 88% on RA ; 98% on 2L/min Speech Therapy to assess dysphagia risk. Sedating meds will be weaned as possible.

## 2023-03-03 NOTE — Progress Notes (Signed)
 NURSING HOME LOCATION:  [facility] ROOM NUMBER:  [id number]  CODE STATUS: INR   PCP:  Kathleene Hazel. Margo Aye MD  This is a comprehensive admission note to this SNFperformed on this date less than 30 days from date of admission. Included are preadmission medical/surgical history; reconciled medication list; family history; social history and comprehensive review of systems.  Corrections and additions to the records were documented. Comprehensive physical exam was also performed. Additionally a clinical summary was entered for each active diagnosis pertinent to this admission in the Problem List to enhance continuity of care.  HPI: She was hospitalized 2/26 - 03/03/2023 presenting to the ED with progressive weakness and somnolence.  Family reported increasing confusion and lethargy over the 2 weeks PTA.  She was admitted with a working diagnosis of acute encephalopathy, multifactorial with documented hypercapnia.  pH was 7.3 with a pCO2 of 73 & pO2 of 69 on venous gases.  Portable chest x-ray 2/26 revealed small left pleural effusion with left basilar subsegmental atelectasis.  Diffuse interstitial prominence and coarsening was present indicating chronic changes, edema, or atypical infection.  CT of the chest revealed cardiac enlargement;diffuse airspace and interstitial disease; & atelectasis or consolidation of the lung bases with diffuse bronchitic changes.  Multifocal pneumonia or edema was suggested with aspiration possible consideration in the setting of her acute mental status changes.  A large esophageal hiatal hernia was documented as well as aortic atherosclerosis. CT of the head revealed nonspecific hypoattenuation in the periventricular ventricular and subcortical white matter suggestive of chronic microvascular ischemic changes.  Remote lacunar infarcts in the basal ganglia were noted.  Prominence of ventricles suggested underlying parenchymal volume loss.  Atherosclerosis in the carotid  siphons was present. There was gradual improvement in her mentation and she was able to be moved from the ICU on 2/27.  She was transitioned from parenteral antibiotics to Levaquin and Flagyl for aspiration pneumonia and Enterobacter cloacae on urine culture.  Speech therapy performed swallow evaluation; there were no significant findings reported. White count peaked at 12,700.  H/H were normal.Initial GFR was 58; subsequently this rose to greater than 60 which is her baseline.  Lactic acid level was normal.While hospitalized glucoses ranged from 96 up to 133. She was deemed stable enough for discharge to [facility] for rehab.  Past medical and surgical history: Includes COPD, CAD, GERD, essential hypertension, peripheral arterial disease, and history of seizures. Significant procedures and surgeries include abdominal hysterectomy, coronary/graft revascularization in context of history of acute MI, and craniotomy for subdural hematoma.  Family history: reviewed, non contributory due to advanced age.  Social history: Nondrinker; non-smoker.   Review of systems: She was very lethargic upon awakening; but she identified the reasons for hospitalization as "pneumonia and UTI."  Today her major complaint is "terrible headache" behind the right ear which radiates to the right crown and the right shoulder.  She stated this has been present "for a good while" but could not define the exact period of time.  This is in the context of chronic right lateral flexion of the head and neck.  She does validate occasional dysphagia.  She states that she used to snore.  She denies being told she had apnea.  She states her breathing is better; all secretions are white.  She also describes diffuse arthritic pain "all over."  She states that she was ambulating with a walker PTA.  She validates tingling in her legs.  Constitutional: No fever, significant weight change  Eyes: No redness, discharge, pain, vision  change ENT/mouth: No nasal congestion, purulent discharge, earache, change in hearing, sore throat  Cardiovascular: No chest pain, palpitations, paroxysmal nocturnal dyspnea, claudication, edema  Respiratory: No hemoptysis Gastrointestinal: No heartburn, abdominal pain, nausea /vomiting, rectal bleeding, melena, change in bowels Genitourinary: No dysuria, hematuria, pyuria, incontinence, nocturia Dermatologic: No rash, pruritus, change in appearance of skin Neurologic: No dizziness, syncope, seizures Psychiatric: No significant anxiety, depression, insomnia, anorexia Endocrine: No change in hair/skin/nails, excessive thirst, excessive hunger, excessive urination  Hematologic/lymphatic: No significant bruising, lymphadenopathy, abnormal bleeding Allergy/immunology: No itchy/watery eyes, significant sneezing, urticaria, angioedema  Physical exam:  Pertinent or positive findings: She was initially asleep without demonstrating frank apnea, snoring, or hypopnea.  She did awaken easily but remained lethargic.She appears chronically ill.  As noted her head and neck are flexed to the right.  Responses were slow.  Facies tend to be somewhat blank.  Eyebrows are essentially absent.  She is wearing nasal oxygen.  Breath sounds are decreased.  She has a raspy grade 1.5 systolic murmur at the base.  Abdomen somewhat doughy. Pedal pulses not palpable.  She has limb atrophy and interosseous wasting.  She has marked arthritic changes of the MCP, PIP, DIP joints with some contractures. She was too weak to sit up independently.  General appearance:  no acute distress, increased work of breathing is present.   Lymphatic: No lymphadenopathy about the head, neck, axilla. Eyes: No conjunctival inflammation or lid edema is present. There is no scleral icterus. Ears:  External ear exam shows no significant lesions or deformities.   Nose:  External nasal examination shows no deformity or inflammation. Nasal mucosa are  pink and moist without lesions, exudates Oral exam: Lips and gums are healthy appearing.There is no oropharyngeal erythema or exudate. Neck:  No thyromegaly, masses, tenderness noted.    Heart:  Normal rate and regular rhythm. S1 and S2 normal without gallop, click, rub.  Lungs:  without wheezes, rhonchi, rales, rubs. Abdomen: Bowel sounds are normal.  Abdomen is nontender with no organomegaly, hernias, masses. GU: Deferred  Extremities:  No cyanosis, clubbing, edema. Neurologic exam: Balance, Rhomberg, finger to nose testing could not be completed due to clinical state Skin: Warm & dry w/o tenting. No significant lesions or rash.  See clinical summary under each active problem in the Problem List with associated updated therapeutic plan

## 2023-03-03 NOTE — TOC Transition Note (Signed)
 Transition of Care [facility]) - Discharge Note   Patient Details  Name: Beverly [name] MRN: [id number] Date of Birth: 05-02-1932  [facility] CM/SW Contact:  Leitha Bleak, RN Phone Number: 03/03/2023, 11:11 AM   Clinical Narrative:   Patient discharging to [facility] today. Kerri provided room number, RN calling report. CM spoke with her daughter to update her with the DC plan.    Final next level of care: Skilled Nursing Facility Barriers to Discharge: Barriers Resolved   Patient Goals and CMS Choice Patient states their goals for this hospitalization and ongoing recovery are:: go to SNF CMS Medicare.gov Compare Post Acute Care list provided to:: Patient Represenative (must comment) Choice offered to / list presented to : Adult Children [facility] ownership interest in [facility].provided to:: Adult Children   Discharge Placement            Patient to be transferred to facility by: Staff Name of family member notified: Daughter Patient and family notified of of transfer: 03/03/23  Discharge Plan and Services Additional resources added to the After Visit Summary for   In-house Referral: Clinical Social Work Discharge Planning Services: CM Consult Post Acute Care Choice: Skilled Nursing Facility              Social Drivers of Health (SDOH) Interventions SDOH Screenings   Food Insecurity: No Food Insecurity (03/02/2023)  Housing: Low Risk  (03/02/2023)  Transportation Needs: No Transportation Needs (03/02/2023)  Utilities: Not At Risk (03/02/2023)  Depression (PHQ2-9): Low Risk  (09/12/2022)  Financial Resource Strain: Low Risk  (09/12/2022)  Social Connections: Socially Integrated (03/02/2023)  Stress: No Stress Concern Present (09/12/2022)  Tobacco Use: Low Risk  (03/01/2023)  Health Literacy: Adequate Health Literacy (08/12/2022)     Readmission Risk Interventions    03/02/2023   12:29 PM 07/17/2022    1:51 PM  Readmission Risk Prevention Plan  Medication  Screening Complete   Transportation Screening Complete Complete  PCP or Specialist Appt within 5-7 Days  Complete  Home Care Screening  Complete  Medication Review (RN CM)  Complete

## 2023-03-03 NOTE — Assessment & Plan Note (Addendum)
 Wean high risk meds as possible due to high risk of repeat aspiration due to respiratory suppression

## 2023-03-03 NOTE — Progress Notes (Signed)
 Report called to [facility] nurse.

## 2023-03-03 NOTE — Assessment & Plan Note (Addendum)
 Complete antibiotic course at the SNF.  At this time she denies any GU symptoms. Respiratory symptoms improved subjectively.

## 2023-03-03 NOTE — Patient Instructions (Signed)
 See assessment and plan under each diagnosis in the problem list and acutely for this visit

## 2023-03-03 NOTE — Assessment & Plan Note (Addendum)
 Trial of topical Voltaren to the posterior cervical neck to see if this will help decrease the frequency and severity of her headaches.  An alternative could be a lidocaine patch.  Her present medications are associated with extremely high risk for repeat aspiration & respiratory suppression and death.

## 2023-03-03 NOTE — Plan of Care (Signed)

## 2023-03-04 LAB — LEGIONELLA PNEUMOPHILA SEROGP 1 UR AG: L. pneumophila Serogp 1 Ur Ag: NEGATIVE

## 2023-03-06 LAB — CULTURE, BLOOD (ROUTINE X 2)
Culture: NO GROWTH
Culture: NO GROWTH
Special Requests: ADEQUATE

## 2023-03-06 NOTE — Consult Note (Signed)
 [facility] Liaison Consult Note   03/06/2023  NYSA SARIN [birth year] [id number]   Primary Care Provider:  Marga Melnick, MD [facility] and Adult Medicine)  Patient is currently active with Care Management for chronic disease management services.  Patient has been engaged by a  [employer].  Our community based plan of care has focused on disease management and community resource support.   Patient will receive a post hospital call and will be evaluated for assessments and disease process education.   Plan: Pt discharged to [facility] on 03/03/23. Liaison will collaborate with PCA-RN vi [facility] concerning pt's discharge disposition.   Of note, Care Management services does not replace or interfere with any services that are needed or arranged by inpatient [facility] care management team.   For additional questions or referrals please contact:    Elliot Cousin, RN, BSN Hospital Liaison    [facility], Population Health Office Hours MTWF  8:00 am-6:00 pm Direct Dial: [phone] mobile [email or web address]

## 2023-03-07 ENCOUNTER — Encounter: Payer: Self-pay | Admitting: *Deleted

## 2023-03-13 ENCOUNTER — Other Ambulatory Visit: Payer: Self-pay | Admitting: *Deleted

## 2023-03-13 NOTE — Patient Outreach (Signed)
 Mrs. Pelland resides in [facility]. She has been active with [facility] RN CM for complex care management needs as benefit of health plan and PCP.   Update received from [facility], SNF social worker indicating transition plan is potentially for long term care.   Writer will continue to follow in case plans change. Will keep [facility] RN CM updated as well.   Raiford Noble, MSN, RN, BSN [facility], [facility] RN Post- Acute Care Manager Direct Dial: [phone]

## 2023-03-15 ENCOUNTER — Other Ambulatory Visit: Payer: Self-pay | Admitting: Adult Health

## 2023-03-15 MED ORDER — ALPRAZOLAM 0.5 MG PO TABS
0.5000 mg | ORAL_TABLET | Freq: Two times a day (BID) | ORAL | 0 refills | Status: DC | PRN
Start: 2023-03-15 — End: 2023-03-15

## 2023-03-15 MED ORDER — ALPRAZOLAM 0.5 MG PO TABS
0.5000 mg | ORAL_TABLET | Freq: Two times a day (BID) | ORAL | 0 refills | Status: DC | PRN
Start: 2023-03-15 — End: 2023-03-21

## 2023-03-16 ENCOUNTER — Other Ambulatory Visit: Payer: Self-pay | Admitting: Adult Health

## 2023-03-16 MED ORDER — HYDROCODONE-ACETAMINOPHEN 5-325 MG PO TABS: 1.0000 | ORAL_TABLET | Freq: Three times a day (TID) | ORAL | 0 refills | Status: DC | PRN

## 2023-03-17 ENCOUNTER — Encounter: Payer: Self-pay | Admitting: Adult Health

## 2023-03-17 ENCOUNTER — Non-Acute Institutional Stay (SKILLED_NURSING_FACILITY): Payer: Self-pay | Admitting: Adult Health

## 2023-03-17 ENCOUNTER — Ambulatory Visit: Payer: Self-pay | Admitting: *Deleted

## 2023-03-17 DIAGNOSIS — I700 Atherosclerosis of aorta: Secondary | ICD-10-CM | POA: Diagnosis not present

## 2023-03-17 DIAGNOSIS — E441 Mild protein-calorie malnutrition: Secondary | ICD-10-CM

## 2023-03-17 DIAGNOSIS — J42 Unspecified chronic bronchitis: Secondary | ICD-10-CM | POA: Diagnosis not present

## 2023-03-17 NOTE — Progress Notes (Signed)
 Location:  [facility] Room Number: 128-P Place of Service:  SNF (31)   CODE STATUS: DNR  Allergies  Allergen Reactions   Lisinopril Swelling   Egg-Derived Products Itching    Patient reports that she can eat eggs but does not tolerate egg derived products   Morphine And Codeine     Headache   Penicillins Itching         Chief Complaint  Patient presents with   Acute Visit    Care plan meeting.    HPI:  We have come together for her care plan meeting. Family present BIMS 13/15 mood 6/30: decreased energy, nervous. Uses wheelchair with no falls. She requires moderate to dependent assist with her adl care. She is frequently incontinent of bladder and bowel. Dietary: regular diet; setup for meals; appetite 26-75%; weigh is 143.4 pounds. Therapy: ambulate 38 feet with rolling walker with contact guard assist; upper body min assist; lower body mod assist; brp min assist; needs a car transfer BCAT 24/50. She will continue to be followed for her chronic illnesses including:  Aortic atherosclerosis  Chronic bronchitis unspecified bronchitis type     Mild protein malnutrition  Past Medical History:  Diagnosis Date   Anxiety    Arthritis    COPD (chronic obstructive pulmonary disease) (HCC)    Coronary artery disease    a. STEMI with cardiac arrest (polymorphic VT) s/p DES to RCA.   GERD (gastroesophageal reflux disease)    Headache(784.0)    Hearing loss, central    Left ear   Hematoma    Hypertension    Memory changes    Mild aortic stenosis    Mild carotid artery disease (HCC)    a. 1-39% by duplex 02/2018.   Seizures (HCC) 10/2012   No seizures, speech issues; after a fall, hitting head on left side   UTI (urinary tract infection)    June 2022    Past Surgical History:  Procedure Laterality Date   ABDOMINAL HYSTERECTOMY     APPENDECTOMY     CARDIAC CATHETERIZATION     15 yrs ago   CORONARY/GRAFT ACUTE MI REVASCULARIZATION N/A 01/10/2018    Procedure: Coronary/Graft Acute MI Revascularization;  Surgeon: Runell Gess, MD;  Location: [facility] INVASIVE CV LAB;  Service: Cardiovascular;  Laterality: N/A;   CRANIOTOMY Left 10/30/2012   Procedure: CRANIOTOMY HEMATOMA EVACUATION SUBDURAL;  Surgeon: Maeola Harman, MD;  Location: [facility] NEURO ORS;  Service: Neurosurgery;  Laterality: Left;  Left Craniotomy for evacuation of subdural hematoma   JOINT REPLACEMENT Bilateral    knees   LEFT HEART CATH AND CORONARY ANGIOGRAPHY N/A 01/10/2018   Procedure: LEFT HEART CATH AND CORONARY ANGIOGRAPHY;  Surgeon: Runell Gess, MD;  Location: [facility] INVASIVE CV LAB;  Service: Cardiovascular;  Laterality: N/A;    Social History   Socioeconomic History   Marital status: Married    Spouse name: Not on file   Number of children: Not on file   Years of education: Not on file   Highest education level: Not on file  Occupational History   Not on file  Tobacco Use   Smoking status: Never   Smokeless tobacco: Never  Vaping Use   Vaping status: Never Used  Substance and Sexual Activity   Alcohol use: No   Drug use: No   Sexual activity: Not on file  Other Topics Concern   Not on file  Social History Narrative   Not on file   Social Drivers of  Health   Financial Resource Strain: Low Risk  (09/12/2022)   Overall Financial Resource Strain (CARDIA)    Difficulty of Paying Living Expenses: Not hard at all  Food Insecurity: No Food Insecurity (03/02/2023)   Hunger Vital Sign    Worried About Running Out of Food in the Last Year: Never true    Ran Out of Food in the Last Year: Never true  Transportation Needs: No Transportation Needs (03/02/2023)   PRAPARE - Administrator, Civil Service (Medical): No    Lack of Transportation (Non-Medical): No  Physical Activity: Not on file  Stress: No Stress Concern Present (09/12/2022)   [employer] of Occupational Health - Occupational Stress Questionnaire    Feeling of Stress : Not at all  Social  Connections: Socially Integrated (03/02/2023)   Social Connection and Isolation Panel [NHANES]    Frequency of Communication with Friends and Family: Three times a week    Frequency of Social Gatherings with Friends and Family: Three times a week    Attends Religious Services: 1 to 4 times per year    Active Member of Clubs or Organizations: Yes    Attends Banker Meetings: 1 to 4 times per year    Marital Status: Married  Catering manager Violence: Not At Risk (03/02/2023)   Humiliation, Afraid, Rape, and Kick questionnaire    Fear of Current or Ex-Partner: No    Emotionally Abused: No    Physically Abused: No    Sexually Abused: No   Family History  Problem Relation Age of Onset   Dementia Mother    Cancer Father    Dementia Maternal Aunt    Ataxia Neg Hx    Chorea Neg Hx    Mental retardation Neg Hx    Migraines Neg Hx    Multiple sclerosis Neg Hx    Neurofibromatosis Neg Hx    Neuropathy Neg Hx    Parkinsonism Neg Hx    Seizures Neg Hx    Stroke Neg Hx       VITAL SIGNS BP (!) 100/58   Pulse 63   Temp 97.6 F (36.4 C)   Resp 20   Ht 5' (1.524 m)   Wt 140 lb (63.5 kg)   SpO2 93%   BMI 27.34 kg/m   Outpatient Encounter Medications as of 03/17/2023  Medication Sig   acetaminophen (TYLENOL) 500 MG tablet Take 500 mg by mouth every 4 (four) hours.   ALPRAZolam (XANAX) 0.5 MG tablet Take 1 tablet (0.5 mg total) by mouth 2 (two) times daily as needed for anxiety or sleep.   amLODipine (NORVASC) 10 MG tablet Take 1 tablet (10 mg total) by mouth daily.   aspirin EC 81 MG tablet Take 1 tablet (81 mg total) by mouth daily with breakfast.   atorvastatin (LIPITOR) 80 MG tablet Take 1 tablet (80 mg total) by mouth daily at 6 PM.   carvedilol (COREG) 12.5 MG tablet Take 1 tablet (12.5 mg total) by mouth 2 (two) times daily.   celecoxib (CELEBREX) 200 MG capsule Take 1 capsule (200 mg total) by mouth 2 (two) times daily.   Cholecalciferol 25 MCG (1000 UT) TBDP  Take 1,000 Units by mouth daily.   diclofenac Sodium (VOLTAREN) 1 % GEL Apply 2 g topically 2 (two) times daily as needed.   famotidine (PEPCID) 20 MG tablet Take 20 mg by mouth daily.   fluticasone (FLONASE) 50 MCG/ACT nasal spray Place 2 sprays into both nostrils daily.  gabapentin (NEURONTIN) 100 MG capsule Take 1 capsule (100 mg total) by mouth at bedtime.   guaiFENesin (MUCINEX) 600 MG 12 hr tablet Take 1 tablet (600 mg total) by mouth 2 (two) times daily.   latanoprost (XALATAN) 0.005 % ophthalmic solution INSTILL ONE DROP IN BOTH EYES NIGHTLY   levETIRAcetam (KEPPRA) 500 MG tablet Take 1 tablet (500 mg total) by mouth 2 (two) times daily.   memantine (NAMENDA) 5 MG tablet Take 1 tablet (5 mg total) by mouth 2 (two) times daily.   nitroGLYCERIN (NITROSTAT) 0.4 MG SL tablet Place 1 tablet (0.4 mg total) under the tongue every 5 (five) minutes x 3 doses as needed for chest pain.   Nutritional Supplements (ENSURE ENLIVE PO) Take 237 mLs by mouth daily.   omeprazole (PRILOSEC) 40 MG capsule Take 40 mg by mouth daily.   oseltamivir (TAMIFLU) 30 MG capsule Take 30 mg by mouth daily.   Propylene Glycol (SYSTANE BALANCE OP) Place 2 drops into both eyes daily as needed (dry eyes).   risperiDONE (RISPERDAL) 0.5 MG tablet Take 1 tablet (0.5 mg total) by mouth 2 (two) times daily.   sennosides-docusate sodium (SENOKOT-S) 8.6-50 MG tablet Take 1 tablet by mouth daily as needed for constipation.   sertraline (ZOLOFT) 50 MG tablet Take 1 tablet (50 mg total) by mouth daily.   HYDROcodone-acetaminophen (NORCO/VICODIN) 5-325 MG tablet Take 1 tablet by mouth 3 (three) times daily as needed for moderate pain (pain score 4-6).   No facility-administered encounter medications on file as of 03/17/2023.     SIGNIFICANT DIAGNOSTIC EXAMS  Review of Systems  Constitutional:  Negative for malaise/fatigue.  Respiratory:  Negative for cough and shortness of breath.   Cardiovascular:  Negative for chest pain,  palpitations and leg swelling.  Gastrointestinal:  Negative for abdominal pain, constipation and heartburn.  Musculoskeletal:  Negative for back pain, joint pain and myalgias.  Skin: Negative.   Neurological:  Negative for dizziness.  Psychiatric/Behavioral:  The patient is not nervous/anxious.    Physical Exam Constitutional:      General: She is not in acute distress.    Appearance: She is well-developed. She is not diaphoretic.  Neck:     Thyroid: No thyromegaly.  Cardiovascular:     Rate and Rhythm: Normal rate and regular rhythm.     Pulses: Normal pulses.     Heart sounds: Murmur heard.  Pulmonary:     Effort: Pulmonary effort is normal. No respiratory distress.     Breath sounds: Normal breath sounds.  Abdominal:     General: Bowel sounds are normal. There is no distension.     Palpations: Abdomen is soft.     Tenderness: There is no abdominal tenderness.  Musculoskeletal:        General: Normal range of motion.     Cervical back: Neck supple.     Right lower leg: No edema.     Left lower leg: No edema.  Lymphadenopathy:     Cervical: No cervical adenopathy.  Skin:    General: Skin is warm and dry.  Neurological:     Mental Status: She is alert. Mental status is at baseline.  Psychiatric:        Mood and Affect: Mood normal.      ASSESSMENT/ PLAN:  TODAY  1. Aortic atherosclerosis 2. Chronic bronchitis unspecified bronchitis type 3. Mild protein malnutrition   Will continue current medications Will continue therapy as directed Will continue to monitor her status.  Goal of care  is: to return back home  Time spent with patient: 40 minutes: medications; therapy; dietary.    Synthia Innocent NP [facility] Adult Medicine   call [phone]

## 2023-03-17 NOTE — Patient Outreach (Addendum)
 Care Coordination   Collaboration with [facility]BCI facility liaison  Visit Note   03/17/2023 Name: Beverly [name] MRN: [id number] DOB: 14-Apr-1932  Beverly [name] is a 89 y.o. year old female who sees Beverly [name], Beverly Hazel, MD for primary care. I  collaborated with Beverly [name] facility coordinator .  What matters to the patients health and wellness today?  Beverly [name] received update from [employer]. She was informed of a transition plan potentially for Long Term care (LTC) at [facility]. Beverly [name] will continue to follow at SNF in case plans change. She has not transitioned to LTC yet. With her having medicare advantage plan, she will likely not stay skilled long.      Goals Addressed             This Visit's Progress    to [facility] since, palliatve care services, pneumonia prevention/COPD- care coordination services       Interventions Today    Flowsheet Row Most Recent Value  Chronic Disease   Chronic disease during today's visit Other  General Interventions   General Interventions Discussed/Reviewed General Interventions Reviewed, Communication with  Communication with PCP/Specialists, RN  Public house manager with VBCI post acute care coordinator for [facility] facility, Note sent to pcp Case closure until a new referral provided for ambulatory services Remains at [facility] snf]              SDOH assessments and interventions completed:  No     Care Coordination Interventions:  Yes, provided   Follow up plan: No further intervention required. RN CM will be notified if patient returns home for any further VBCI services. It has been 3 months, therefore RN CM will close the case and await a new referral if needed. PCP to be routed this note    Encounter Outcome:  Patient Visit Completed    [name], RN, BSN, CCM [place]  [facility], [facility] RN Care Manager Direct Dial: [phone]  Fax: [phone]

## 2023-03-21 ENCOUNTER — Non-Acute Institutional Stay (SKILLED_NURSING_FACILITY): Admitting: Adult Health

## 2023-03-21 ENCOUNTER — Encounter: Payer: Self-pay | Admitting: Adult Health

## 2023-03-21 DIAGNOSIS — E441 Mild protein-calorie malnutrition: Secondary | ICD-10-CM

## 2023-03-21 DIAGNOSIS — G3184 Mild cognitive impairment, so stated: Secondary | ICD-10-CM

## 2023-03-21 DIAGNOSIS — F339 Major depressive disorder, recurrent, unspecified: Secondary | ICD-10-CM

## 2023-03-21 DIAGNOSIS — J9601 Acute respiratory failure with hypoxia: Secondary | ICD-10-CM | POA: Diagnosis not present

## 2023-03-21 DIAGNOSIS — G40109 Localization-related (focal) (partial) symptomatic epilepsy and epileptic syndromes with simple partial seizures, not intractable, without status epilepticus: Secondary | ICD-10-CM | POA: Diagnosis not present

## 2023-03-21 MED ORDER — RISPERIDONE 0.5 MG PO TABS: 0.5000 mg | ORAL_TABLET | Freq: Two times a day (BID) | ORAL | 0 refills | Status: DC

## 2023-03-21 MED ORDER — LEVETIRACETAM 500 MG PO TABS: 500.0000 mg | ORAL_TABLET | Freq: Two times a day (BID) | ORAL | 0 refills | Status: DC

## 2023-03-21 MED ORDER — NITROGLYCERIN 0.4 MG SL SUBL
0.4000 mg | SUBLINGUAL_TABLET | SUBLINGUAL | 0 refills | Status: AC | PRN
Start: 2023-03-21 — End: ?

## 2023-03-21 MED ORDER — OSELTAMIVIR PHOSPHATE 30 MG PO CAPS: 30.0000 mg | ORAL_CAPSULE | Freq: Every day | ORAL | 0 refills | Status: AC

## 2023-03-21 MED ORDER — AMLODIPINE BESYLATE 10 MG PO TABS: 10.0000 mg | ORAL_TABLET | Freq: Every day | ORAL | 0 refills | Status: DC

## 2023-03-21 MED ORDER — CELECOXIB 200 MG PO CAPS: 200.0000 mg | ORAL_CAPSULE | Freq: Two times a day (BID) | ORAL | 0 refills | Status: DC

## 2023-03-21 MED ORDER — LATANOPROST 0.005 % OP SOLN: OPHTHALMIC | 0 refills | Status: AC

## 2023-03-21 MED ORDER — CARVEDILOL 12.5 MG PO TABS: 12.5000 mg | ORAL_TABLET | Freq: Two times a day (BID) | ORAL | 0 refills | Status: AC

## 2023-03-21 MED ORDER — GABAPENTIN 100 MG PO CAPS: 100.0000 mg | ORAL_CAPSULE | Freq: Every day | ORAL | 0 refills | Status: DC

## 2023-03-21 MED ORDER — SERTRALINE HCL 50 MG PO TABS
50.0000 mg | ORAL_TABLET | Freq: Every day | ORAL | 0 refills | Status: AC
Start: 2023-03-21 — End: ?

## 2023-03-21 MED ORDER — ALPRAZOLAM 0.5 MG PO TABS: 0.5000 mg | ORAL_TABLET | Freq: Two times a day (BID) | ORAL | 0 refills | Status: DC | PRN

## 2023-03-21 MED ORDER — ATORVASTATIN CALCIUM 80 MG PO TABS: 80.0000 mg | ORAL_TABLET | Freq: Every day | ORAL | 0 refills | Status: DC

## 2023-03-21 MED ORDER — HYDROCODONE-ACETAMINOPHEN 5-325 MG PO TABS: 1.0000 | ORAL_TABLET | Freq: Three times a day (TID) | ORAL | 0 refills | Status: DC | PRN

## 2023-03-21 MED ORDER — MEMANTINE HCL 5 MG PO TABS: 5.0000 mg | ORAL_TABLET | Freq: Two times a day (BID) | ORAL | 0 refills | Status: AC

## 2023-03-21 MED ORDER — OMEPRAZOLE 40 MG PO CPDR: 40.0000 mg | DELAYED_RELEASE_CAPSULE | Freq: Every day | ORAL | 0 refills | Status: AC

## 2023-03-21 NOTE — Progress Notes (Signed)
 Location:  [facility] Room Number: 128 Place of Service:  SNF (31)   CODE STATUS: dnr   Allergies  Allergen Reactions   Lisinopril Swelling   Egg-Derived Products Itching    Patient reports that she can eat eggs but does not tolerate egg derived products   Morphine And Codeine     Headache   Penicillins Itching         Chief Complaint  Patient presents with   Discharge Note    HPI:  She is being discharged to home with home health for pt/ot. She will not need dme. She will need her prescriptions written and will need to follow up with her medical provider. She had been hospitalized for pneumonia and acute respiratory failure. She was admitted to this facility for short term rehab. Therapy: ambulate 38 feet with rolling walker and contact guard assist; upper body min assist; lower body mod assist; steps bilateral hand rails min assist; brp min assist.   Past Medical History:  Diagnosis Date   Anxiety    Arthritis    COPD (chronic obstructive pulmonary disease) (HCC)    Coronary artery disease    a. STEMI with cardiac arrest (polymorphic VT) s/p DES to RCA.   GERD (gastroesophageal reflux disease)    Headache(784.0)    Hearing loss, central    Left ear   Hematoma    Hypertension    Memory changes    Mild aortic stenosis    Mild carotid artery disease (HCC)    a. 1-39% by duplex 02/2018.   Seizures (HCC) 10/2012   No seizures, speech issues; after a fall, hitting head on left side   UTI (urinary tract infection)    June 2022    Past Surgical History:  Procedure Laterality Date   ABDOMINAL HYSTERECTOMY     APPENDECTOMY     CARDIAC CATHETERIZATION     15 yrs ago   CORONARY/GRAFT ACUTE MI REVASCULARIZATION N/A 01/10/2018   Procedure: Coronary/Graft Acute MI Revascularization;  Surgeon: Runell Gess, MD;  Location: [facility] INVASIVE CV LAB;  Service: Cardiovascular;  Laterality: N/A;   CRANIOTOMY Left 10/30/2012   Procedure: CRANIOTOMY  HEMATOMA EVACUATION SUBDURAL;  Surgeon: Maeola Harman, MD;  Location: [facility] NEURO ORS;  Service: Neurosurgery;  Laterality: Left;  Left Craniotomy for evacuation of subdural hematoma   JOINT REPLACEMENT Bilateral    knees   LEFT HEART CATH AND CORONARY ANGIOGRAPHY N/A 01/10/2018   Procedure: LEFT HEART CATH AND CORONARY ANGIOGRAPHY;  Surgeon: Runell Gess, MD;  Location: [facility] INVASIVE CV LAB;  Service: Cardiovascular;  Laterality: N/A;    Social History   Socioeconomic History   Marital status: Married    Spouse name: Not on file   Number of children: Not on file   Years of education: Not on file   Highest education level: Not on file  Occupational History   Not on file  Tobacco Use   Smoking status: Never   Smokeless tobacco: Never  Vaping Use   Vaping status: Never Used  Substance and Sexual Activity   Alcohol use: No   Drug use: No   Sexual activity: Not on file  Other Topics Concern   Not on file  Social History Narrative   Not on file   Social Drivers of Health   Financial Resource Strain: Low Risk  (09/12/2022)   Overall Financial Resource Strain (CARDIA)    Difficulty of Paying Living Expenses: Not hard at all  Food  Insecurity: No Food Insecurity (03/02/2023)   Hunger Vital Sign    Worried About Running Out of Food in the Last Year: Never true    Ran Out of Food in the Last Year: Never true  Transportation Needs: No Transportation Needs (03/02/2023)   PRAPARE - Administrator, Civil Service (Medical): No    Lack of Transportation (Non-Medical): No  Physical Activity: Not on file  Stress: No Stress Concern Present (09/12/2022)   [employer] of Occupational Health - Occupational Stress Questionnaire    Feeling of Stress : Not at all  Social Connections: Socially Integrated (03/02/2023)   Social Connection and Isolation Panel [NHANES]    Frequency of Communication with Friends and Family: Three times a week    Frequency of Social Gatherings with Friends  and Family: Three times a week    Attends Religious Services: 1 to 4 times per year    Active Member of Clubs or Organizations: Yes    Attends Banker Meetings: 1 to 4 times per year    Marital Status: Married  Catering manager Violence: Not At Risk (03/02/2023)   Humiliation, Afraid, Rape, and Kick questionnaire    Fear of Current or Ex-Partner: No    Emotionally Abused: No    Physically Abused: No    Sexually Abused: No   Family History  Problem Relation Age of Onset   Dementia Mother    Cancer Father    Dementia Maternal Aunt    Ataxia Neg Hx    Chorea Neg Hx    Mental retardation Neg Hx    Migraines Neg Hx    Multiple sclerosis Neg Hx    Neurofibromatosis Neg Hx    Neuropathy Neg Hx    Parkinsonism Neg Hx    Seizures Neg Hx    Stroke Neg Hx       VITAL SIGNS BP 131/73   Pulse 89   Temp (!) 96.7 F (35.9 C)   Resp 18   Ht 5\' 5"  (1.651 m)   Wt 140 lb 6.4 oz (63.7 kg)   SpO2 96%   BMI 23.36 kg/m   Outpatient Encounter Medications as of 03/21/2023  Medication Sig   acetaminophen (TYLENOL) 500 MG tablet Take 500 mg by mouth every 4 (four) hours.   ALPRAZolam (XANAX) 0.5 MG tablet Take 1 tablet (0.5 mg total) by mouth 2 (two) times daily as needed for anxiety or sleep.   amLODipine (NORVASC) 10 MG tablet Take 1 tablet (10 mg total) by mouth daily.   aspirin EC 81 MG tablet Take 1 tablet (81 mg total) by mouth daily with breakfast.   atorvastatin (LIPITOR) 80 MG tablet Take 1 tablet (80 mg total) by mouth daily at 6 PM.   carvedilol (COREG) 12.5 MG tablet Take 1 tablet (12.5 mg total) by mouth 2 (two) times daily.   celecoxib (CELEBREX) 200 MG capsule Take 1 capsule (200 mg total) by mouth 2 (two) times daily.   Cholecalciferol 25 MCG (1000 UT) TBDP Take 1,000 Units by mouth daily.   diclofenac Sodium (VOLTAREN) 1 % GEL Apply 2 g topically 2 (two) times daily as needed.   famotidine (PEPCID) 20 MG tablet Take 20 mg by mouth daily.   fluticasone (FLONASE)  50 MCG/ACT nasal spray Place 2 sprays into both nostrils daily.   gabapentin (NEURONTIN) 100 MG capsule Take 1 capsule (100 mg total) by mouth at bedtime.   guaiFENesin (MUCINEX) 600 MG 12 hr tablet Take 1 tablet (  600 mg total) by mouth 2 (two) times daily.   HYDROcodone-acetaminophen (NORCO/VICODIN) 5-325 MG tablet Take 1 tablet by mouth 3 (three) times daily as needed for moderate pain (pain score 4-6).   latanoprost (XALATAN) 0.005 % ophthalmic solution INSTILL ONE DROP IN BOTH EYES NIGHTLY   levETIRAcetam (KEPPRA) 500 MG tablet Take 1 tablet (500 mg total) by mouth 2 (two) times daily.   memantine (NAMENDA) 5 MG tablet Take 1 tablet (5 mg total) by mouth 2 (two) times daily.   nitroGLYCERIN (NITROSTAT) 0.4 MG SL tablet Place 1 tablet (0.4 mg total) under the tongue every 5 (five) minutes x 3 doses as needed for chest pain.   Nutritional Supplements (ENSURE ENLIVE PO) Take 237 mLs by mouth daily.   omeprazole (PRILOSEC) 40 MG capsule Take 40 mg by mouth daily.   oseltamivir (TAMIFLU) 30 MG capsule Take 30 mg by mouth daily.   Propylene Glycol (SYSTANE BALANCE OP) Place 2 drops into both eyes daily as needed (dry eyes).   risperiDONE (RISPERDAL) 0.5 MG tablet Take 1 tablet (0.5 mg total) by mouth 2 (two) times daily.   sennosides-docusate sodium (SENOKOT-S) 8.6-50 MG tablet Take 1 tablet by mouth daily as needed for constipation.   sertraline (ZOLOFT) 50 MG tablet Take 1 tablet (50 mg total) by mouth daily.   [DISCONTINUED] lisinopril (ZESTRIL) 20 MG tablet Take 1 tablet (20 mg total) by mouth 2 (two) times daily.   No facility-administered encounter medications on file as of 03/21/2023.     SIGNIFICANT DIAGNOSTIC EXAMS   Review of Systems  Constitutional:  Negative for malaise/fatigue.  Respiratory:  Negative for cough and shortness of breath.   Cardiovascular:  Negative for chest pain, palpitations and leg swelling.  Gastrointestinal:  Negative for abdominal pain, constipation and  heartburn.  Musculoskeletal:  Negative for back pain, joint pain and myalgias.  Skin: Negative.   Neurological:  Negative for dizziness.  Psychiatric/Behavioral:  The patient is not nervous/anxious.    Physical Exam Constitutional:      General: She is not in acute distress.    Appearance: She is well-developed. She is not diaphoretic.  Neck:     Thyroid: No thyromegaly.  Cardiovascular:     Rate and Rhythm: Normal rate and regular rhythm.     Pulses: Normal pulses.     Heart sounds: Murmur heard.  Pulmonary:     Effort: Pulmonary effort is normal. No respiratory distress.     Breath sounds: Normal breath sounds.  Abdominal:     General: Bowel sounds are normal. There is no distension.     Palpations: Abdomen is soft.     Tenderness: There is no abdominal tenderness.  Musculoskeletal:        General: Normal range of motion.     Cervical back: Neck supple.     Right lower leg: No edema.     Left lower leg: No edema.  Lymphadenopathy:     Cervical: No cervical adenopathy.  Skin:    General: Skin is warm and dry.  Neurological:     Mental Status: She is alert. Mental status is at baseline.  Psychiatric:        Mood and Affect: Mood normal.      ASSESSMENT/ PLAN:  Patient is being discharged with the following home health services:  pt/ot to evaluate and treat as indicated for gait balance strength adl training   Patient is being discharged with the following durable medical equipment: none needed    Patient  has been advised to f/u with their PCP in 1-2 weeks to for a transitions of care visit.  Social services at their facility was responsible for arranging this appointment.  Pt was provided with adequate prescriptions of noncontrolled medications to reach the scheduled appointment .  For controlled substances, a limited supply was provided as appropriate for the individual patient.  If the pt normally receives these medications from a pain clinic or has a contract with  another physician, these medications should be received from that clinic or physician only).    Prescription medications have been sent to [facility]  Time spent with patient: 35 minutes: medications; home health; dme   Synthia Innocent NP [facility]  call [phone]

## 2023-03-24 ENCOUNTER — Other Ambulatory Visit: Payer: Self-pay | Admitting: *Deleted

## 2023-03-24 NOTE — Patient Outreach (Addendum)
 Post- Acute Care Manager follow up. Mrs. Pence is active with VBCI complex care management team.  Verified in [facility] Mrs. Laforest discharged from [facility] on today 03/24/23.  Telephone call made and secure message sent to [facility] social workers to inquire about home health arrangements. Will await response.   Will alert VBCI RN CM of Mrs. Blaze's discharge to home.  Addendum: Update received from [place], [facility] psychotherapist. Mrs. Fenter return home with [name]. Update sent to [facility] RN CM.     Raiford Noble, MSN, RN, BSN [place]  [facility] RN Post- Acute Care Manager Direct Dial: [phone]

## 2023-03-28 DIAGNOSIS — D509 Iron deficiency anemia, unspecified: Secondary | ICD-10-CM | POA: Diagnosis not present

## 2023-03-28 DIAGNOSIS — K219 Gastro-esophageal reflux disease without esophagitis: Secondary | ICD-10-CM | POA: Diagnosis not present

## 2023-03-28 DIAGNOSIS — Z79899 Other long term (current) drug therapy: Secondary | ICD-10-CM | POA: Diagnosis not present

## 2023-03-28 DIAGNOSIS — I2510 Atherosclerotic heart disease of native coronary artery without angina pectoris: Secondary | ICD-10-CM | POA: Diagnosis not present

## 2023-03-28 DIAGNOSIS — I739 Peripheral vascular disease, unspecified: Secondary | ICD-10-CM | POA: Diagnosis not present

## 2023-03-28 DIAGNOSIS — H3530 Unspecified macular degeneration: Secondary | ICD-10-CM | POA: Diagnosis not present

## 2023-03-28 DIAGNOSIS — Z791 Long term (current) use of non-steroidal anti-inflammatories (NSAID): Secondary | ICD-10-CM | POA: Diagnosis not present

## 2023-03-28 DIAGNOSIS — E441 Mild protein-calorie malnutrition: Secondary | ICD-10-CM | POA: Diagnosis not present

## 2023-03-28 DIAGNOSIS — J440 Chronic obstructive pulmonary disease with acute lower respiratory infection: Secondary | ICD-10-CM | POA: Diagnosis not present

## 2023-03-28 DIAGNOSIS — I350 Nonrheumatic aortic (valve) stenosis: Secondary | ICD-10-CM | POA: Diagnosis not present

## 2023-03-28 DIAGNOSIS — J9601 Acute respiratory failure with hypoxia: Secondary | ICD-10-CM | POA: Diagnosis not present

## 2023-03-28 DIAGNOSIS — I10 Essential (primary) hypertension: Secondary | ICD-10-CM | POA: Diagnosis not present

## 2023-03-28 DIAGNOSIS — G9340 Encephalopathy, unspecified: Secondary | ICD-10-CM | POA: Diagnosis not present

## 2023-03-28 DIAGNOSIS — Z79891 Long term (current) use of opiate analgesic: Secondary | ICD-10-CM | POA: Diagnosis not present

## 2023-03-28 DIAGNOSIS — Z9981 Dependence on supplemental oxygen: Secondary | ICD-10-CM | POA: Diagnosis not present

## 2023-03-28 DIAGNOSIS — G4710 Hypersomnia, unspecified: Secondary | ICD-10-CM | POA: Diagnosis not present

## 2023-03-28 DIAGNOSIS — I700 Atherosclerosis of aorta: Secondary | ICD-10-CM | POA: Diagnosis not present

## 2023-03-28 DIAGNOSIS — Z7982 Long term (current) use of aspirin: Secondary | ICD-10-CM | POA: Diagnosis not present

## 2023-03-30 DIAGNOSIS — G9340 Encephalopathy, unspecified: Secondary | ICD-10-CM | POA: Diagnosis not present

## 2023-03-30 DIAGNOSIS — I700 Atherosclerosis of aorta: Secondary | ICD-10-CM | POA: Diagnosis not present

## 2023-03-30 DIAGNOSIS — I2510 Atherosclerotic heart disease of native coronary artery without angina pectoris: Secondary | ICD-10-CM | POA: Diagnosis not present

## 2023-03-30 DIAGNOSIS — I350 Nonrheumatic aortic (valve) stenosis: Secondary | ICD-10-CM | POA: Diagnosis not present

## 2023-03-30 DIAGNOSIS — Z79891 Long term (current) use of opiate analgesic: Secondary | ICD-10-CM | POA: Diagnosis not present

## 2023-03-30 DIAGNOSIS — D509 Iron deficiency anemia, unspecified: Secondary | ICD-10-CM | POA: Diagnosis not present

## 2023-03-30 DIAGNOSIS — I739 Peripheral vascular disease, unspecified: Secondary | ICD-10-CM | POA: Diagnosis not present

## 2023-03-30 DIAGNOSIS — H3530 Unspecified macular degeneration: Secondary | ICD-10-CM | POA: Diagnosis not present

## 2023-03-30 DIAGNOSIS — I10 Essential (primary) hypertension: Secondary | ICD-10-CM | POA: Diagnosis not present

## 2023-03-30 DIAGNOSIS — K219 Gastro-esophageal reflux disease without esophagitis: Secondary | ICD-10-CM | POA: Diagnosis not present

## 2023-03-30 DIAGNOSIS — Z79899 Other long term (current) drug therapy: Secondary | ICD-10-CM | POA: Diagnosis not present

## 2023-03-30 DIAGNOSIS — Z791 Long term (current) use of non-steroidal anti-inflammatories (NSAID): Secondary | ICD-10-CM | POA: Diagnosis not present

## 2023-03-30 DIAGNOSIS — E441 Mild protein-calorie malnutrition: Secondary | ICD-10-CM | POA: Diagnosis not present

## 2023-03-30 DIAGNOSIS — J440 Chronic obstructive pulmonary disease with acute lower respiratory infection: Secondary | ICD-10-CM | POA: Diagnosis not present

## 2023-03-30 DIAGNOSIS — J9601 Acute respiratory failure with hypoxia: Secondary | ICD-10-CM | POA: Diagnosis not present

## 2023-03-30 DIAGNOSIS — G4710 Hypersomnia, unspecified: Secondary | ICD-10-CM | POA: Diagnosis not present

## 2023-03-30 DIAGNOSIS — Z7982 Long term (current) use of aspirin: Secondary | ICD-10-CM | POA: Diagnosis not present

## 2023-03-30 DIAGNOSIS — Z9981 Dependence on supplemental oxygen: Secondary | ICD-10-CM | POA: Diagnosis not present

## 2023-04-03 DIAGNOSIS — Z9981 Dependence on supplemental oxygen: Secondary | ICD-10-CM | POA: Diagnosis not present

## 2023-04-03 DIAGNOSIS — J440 Chronic obstructive pulmonary disease with acute lower respiratory infection: Secondary | ICD-10-CM | POA: Diagnosis not present

## 2023-04-03 DIAGNOSIS — Z791 Long term (current) use of non-steroidal anti-inflammatories (NSAID): Secondary | ICD-10-CM | POA: Diagnosis not present

## 2023-04-03 DIAGNOSIS — H3530 Unspecified macular degeneration: Secondary | ICD-10-CM | POA: Diagnosis not present

## 2023-04-03 DIAGNOSIS — G9340 Encephalopathy, unspecified: Secondary | ICD-10-CM | POA: Diagnosis not present

## 2023-04-03 DIAGNOSIS — D509 Iron deficiency anemia, unspecified: Secondary | ICD-10-CM | POA: Diagnosis not present

## 2023-04-03 DIAGNOSIS — I700 Atherosclerosis of aorta: Secondary | ICD-10-CM | POA: Diagnosis not present

## 2023-04-03 DIAGNOSIS — I2510 Atherosclerotic heart disease of native coronary artery without angina pectoris: Secondary | ICD-10-CM | POA: Diagnosis not present

## 2023-04-03 DIAGNOSIS — E441 Mild protein-calorie malnutrition: Secondary | ICD-10-CM | POA: Diagnosis not present

## 2023-04-03 DIAGNOSIS — Z79899 Other long term (current) drug therapy: Secondary | ICD-10-CM | POA: Diagnosis not present

## 2023-04-03 DIAGNOSIS — I10 Essential (primary) hypertension: Secondary | ICD-10-CM | POA: Diagnosis not present

## 2023-04-03 DIAGNOSIS — G4710 Hypersomnia, unspecified: Secondary | ICD-10-CM | POA: Diagnosis not present

## 2023-04-03 DIAGNOSIS — K219 Gastro-esophageal reflux disease without esophagitis: Secondary | ICD-10-CM | POA: Diagnosis not present

## 2023-04-03 DIAGNOSIS — J9601 Acute respiratory failure with hypoxia: Secondary | ICD-10-CM | POA: Diagnosis not present

## 2023-04-03 DIAGNOSIS — I350 Nonrheumatic aortic (valve) stenosis: Secondary | ICD-10-CM | POA: Diagnosis not present

## 2023-04-03 DIAGNOSIS — Z7982 Long term (current) use of aspirin: Secondary | ICD-10-CM | POA: Diagnosis not present

## 2023-04-03 DIAGNOSIS — I739 Peripheral vascular disease, unspecified: Secondary | ICD-10-CM | POA: Diagnosis not present

## 2023-04-03 DIAGNOSIS — Z79891 Long term (current) use of opiate analgesic: Secondary | ICD-10-CM | POA: Diagnosis not present

## 2023-04-04 ENCOUNTER — Ambulatory Visit: Payer: Self-pay | Admitting: *Deleted

## 2023-04-04 NOTE — Patient Outreach (Signed)
 Care Coordination   Follow Up Visit Note   04/04/2023 Name: Beverly [name] MRN: [id number] DOB: 10-30-1932  Beverly [name] is a 89 y.o. year old female who sees Beverly [name], Beverly Hazel, MD for primary care. I spoke with Beverly [name], daughter of Beverly [name] by phone today.  What matters to the patients health and wellness today?  Dischage home from [facility] facility on 03/24/23 recovery from pneumonia Beverly [name] informs RN CM patient has returned home "one hundred percent" prior to admission. [name] home health therapy scheduled to start. PCP visit on 04/14/23  Denies any medical or social needs today  Agrees to further follow up   Goals Addressed             This Visit's Progress    Post facility, Pneumonia recovery/home therapies - care coordination services   On track    Update 04/04/23 post rehab snf. Discharged 03/24/23. Improvement & recovery from pneumonia well. Goal met   Interventions Today    Flowsheet Row Most Recent Value  Chronic Disease   Chronic disease during today's visit Chronic Obstructive Pulmonary Disease (COPD), Other  [rehab facility recovery from pneumonia]  General Interventions   General Interventions Discussed/Reviewed General Interventions Reviewed, Durable Medical Equipment (DME), [employer], Doctor Visits  [assessed for any needed DME/ post home health services through [name]]  Doctor Visits Discussed/Reviewed Doctor Visits Discussed, PCP, Specialist  [confirm post facility pcp/specialist appointments]  PCP/Specialist Visits Compliance with follow-up visit  Exercise Interventions   Exercise Discussed/Reviewed Exercise Discussed, Physical Activity, Assistive device use and maintanence  Physical Activity Discussed/Reviewed Physical Activity Discussed, Types of exercise  Education Interventions   Education Provided Provided Education  [post snf transition follow up]  Provided Verbal Education On Other  Mental Health Interventions   Mental Health  Discussed/Reviewed Mental Health Discussed, Coping Strategies  Pharmacy Interventions   Pharmacy Dicussed/Reviewed Pharmacy Topics Discussed, Affording Medications  Safety Interventions   Safety Discussed/Reviewed Safety Discussed, Home Safety  Home Safety Assistive Devices              SDOH assessments and interventions completed:  No     Care Coordination Interventions:  Yes, provided   Follow up plan: Follow up call scheduled for 04/19/23    Encounter Outcome:  Patient Visit Completed   [name], RN, BSN, CCM [place]  [facility], [facility] RN Care Manager Direct Dial: [phone]  Fax: [phone]

## 2023-04-04 NOTE — Patient Instructions (Signed)
 Visit Information  Thank you for taking time to visit with me today. Please don't hesitate to contact me if I can be of assistance to you.   Following are the goals we discussed today:   Goals Addressed             This Visit's Progress    Post facility, Pneumonia recovery/home therapies - care coordination services   On track    Update 04/04/23 post rehab snf. Discharged 03/24/23. Improvement & recovery from pneumonia well. Goal met   Interventions Today    Flowsheet Row Most Recent Value  Chronic Disease   Chronic disease during today's visit Chronic Obstructive Pulmonary Disease (COPD), Other  [rehab facility recovery from pneumonia]  General Interventions   General Interventions Discussed/Reviewed General Interventions Reviewed, Durable Medical Equipment (DME), Community Resources, Doctor Visits  [assessed for any needed DME/ post home health services through [name]]  Doctor Visits Discussed/Reviewed Doctor Visits Discussed, PCP, Specialist  [confirm post facility pcp/specialist appointments]  PCP/Specialist Visits Compliance with follow-up visit  Exercise Interventions   Exercise Discussed/Reviewed Exercise Discussed, Physical Activity, Assistive device use and maintanence  Physical Activity Discussed/Reviewed Physical Activity Discussed, Types of exercise  Education Interventions   Education Provided Provided Education  [post snf transition follow up]  Provided Verbal Education On Other  Mental Health Interventions   Mental Health Discussed/Reviewed Mental Health Discussed, Coping Strategies  Pharmacy Interventions   Pharmacy Dicussed/Reviewed Pharmacy Topics Discussed, Affording Medications  Safety Interventions   Safety Discussed/Reviewed Safety Discussed, Home Safety  Home Safety Assistive Devices              Our next appointment is by telephone on 04/19/23 at 2 pm   Please call the care guide team at [phone] if you need to cancel or reschedule your  appointment.   If you are experiencing a Mental Health or Behavioral Health Crisis or need someone to talk to, please call the Suicide and Crisis Lifeline: 988 call the [place] National Suicide Prevention Lifeline: 704-713-5488 or TTY: (773)217-4368 TTY 671 631 7837) to talk to a trained counselor call 1-800-273-TALK (toll free, 24 hour hotline) call the [facility]: 925-179-8162 call 911   Patient verbalizes understanding of instructions and care plan provided today and agrees to view in MyChart. Active MyChart status and patient understanding of how to access instructions and care plan via MyChart confirmed with patient.     The patient has been provided with contact information for the care management team and has been advised to call with any health related questions or concerns.   Ordell Prichett L. Noelle Penner, RN, BSN, CCM [place]  [facility], [facility] RN Care Manager Direct Dial: [phone]  Fax: [phone]

## 2023-04-05 DIAGNOSIS — Z9981 Dependence on supplemental oxygen: Secondary | ICD-10-CM | POA: Diagnosis not present

## 2023-04-05 DIAGNOSIS — I350 Nonrheumatic aortic (valve) stenosis: Secondary | ICD-10-CM | POA: Diagnosis not present

## 2023-04-05 DIAGNOSIS — D509 Iron deficiency anemia, unspecified: Secondary | ICD-10-CM | POA: Diagnosis not present

## 2023-04-05 DIAGNOSIS — E441 Mild protein-calorie malnutrition: Secondary | ICD-10-CM | POA: Diagnosis not present

## 2023-04-05 DIAGNOSIS — K219 Gastro-esophageal reflux disease without esophagitis: Secondary | ICD-10-CM | POA: Diagnosis not present

## 2023-04-05 DIAGNOSIS — I2510 Atherosclerotic heart disease of native coronary artery without angina pectoris: Secondary | ICD-10-CM | POA: Diagnosis not present

## 2023-04-05 DIAGNOSIS — I10 Essential (primary) hypertension: Secondary | ICD-10-CM | POA: Diagnosis not present

## 2023-04-05 DIAGNOSIS — I739 Peripheral vascular disease, unspecified: Secondary | ICD-10-CM | POA: Diagnosis not present

## 2023-04-05 DIAGNOSIS — Z791 Long term (current) use of non-steroidal anti-inflammatories (NSAID): Secondary | ICD-10-CM | POA: Diagnosis not present

## 2023-04-05 DIAGNOSIS — Z79891 Long term (current) use of opiate analgesic: Secondary | ICD-10-CM | POA: Diagnosis not present

## 2023-04-05 DIAGNOSIS — J9601 Acute respiratory failure with hypoxia: Secondary | ICD-10-CM | POA: Diagnosis not present

## 2023-04-05 DIAGNOSIS — G4710 Hypersomnia, unspecified: Secondary | ICD-10-CM | POA: Diagnosis not present

## 2023-04-05 DIAGNOSIS — Z7982 Long term (current) use of aspirin: Secondary | ICD-10-CM | POA: Diagnosis not present

## 2023-04-05 DIAGNOSIS — J440 Chronic obstructive pulmonary disease with acute lower respiratory infection: Secondary | ICD-10-CM | POA: Diagnosis not present

## 2023-04-05 DIAGNOSIS — H3530 Unspecified macular degeneration: Secondary | ICD-10-CM | POA: Diagnosis not present

## 2023-04-05 DIAGNOSIS — Z79899 Other long term (current) drug therapy: Secondary | ICD-10-CM | POA: Diagnosis not present

## 2023-04-05 DIAGNOSIS — I700 Atherosclerosis of aorta: Secondary | ICD-10-CM | POA: Diagnosis not present

## 2023-04-05 DIAGNOSIS — G9340 Encephalopathy, unspecified: Secondary | ICD-10-CM | POA: Diagnosis not present

## 2023-04-07 DIAGNOSIS — Z9981 Dependence on supplemental oxygen: Secondary | ICD-10-CM | POA: Diagnosis not present

## 2023-04-07 DIAGNOSIS — J9601 Acute respiratory failure with hypoxia: Secondary | ICD-10-CM | POA: Diagnosis not present

## 2023-04-07 DIAGNOSIS — E441 Mild protein-calorie malnutrition: Secondary | ICD-10-CM | POA: Diagnosis not present

## 2023-04-07 DIAGNOSIS — I10 Essential (primary) hypertension: Secondary | ICD-10-CM | POA: Diagnosis not present

## 2023-04-07 DIAGNOSIS — D509 Iron deficiency anemia, unspecified: Secondary | ICD-10-CM | POA: Diagnosis not present

## 2023-04-07 DIAGNOSIS — I350 Nonrheumatic aortic (valve) stenosis: Secondary | ICD-10-CM | POA: Diagnosis not present

## 2023-04-07 DIAGNOSIS — I700 Atherosclerosis of aorta: Secondary | ICD-10-CM | POA: Diagnosis not present

## 2023-04-07 DIAGNOSIS — G4710 Hypersomnia, unspecified: Secondary | ICD-10-CM | POA: Diagnosis not present

## 2023-04-07 DIAGNOSIS — I739 Peripheral vascular disease, unspecified: Secondary | ICD-10-CM | POA: Diagnosis not present

## 2023-04-07 DIAGNOSIS — Z79891 Long term (current) use of opiate analgesic: Secondary | ICD-10-CM | POA: Diagnosis not present

## 2023-04-07 DIAGNOSIS — Z791 Long term (current) use of non-steroidal anti-inflammatories (NSAID): Secondary | ICD-10-CM | POA: Diagnosis not present

## 2023-04-07 DIAGNOSIS — K219 Gastro-esophageal reflux disease without esophagitis: Secondary | ICD-10-CM | POA: Diagnosis not present

## 2023-04-07 DIAGNOSIS — I2510 Atherosclerotic heart disease of native coronary artery without angina pectoris: Secondary | ICD-10-CM | POA: Diagnosis not present

## 2023-04-07 DIAGNOSIS — H3530 Unspecified macular degeneration: Secondary | ICD-10-CM | POA: Diagnosis not present

## 2023-04-07 DIAGNOSIS — G9340 Encephalopathy, unspecified: Secondary | ICD-10-CM | POA: Diagnosis not present

## 2023-04-07 DIAGNOSIS — Z79899 Other long term (current) drug therapy: Secondary | ICD-10-CM | POA: Diagnosis not present

## 2023-04-07 DIAGNOSIS — Z7982 Long term (current) use of aspirin: Secondary | ICD-10-CM | POA: Diagnosis not present

## 2023-04-07 DIAGNOSIS — J440 Chronic obstructive pulmonary disease with acute lower respiratory infection: Secondary | ICD-10-CM | POA: Diagnosis not present

## 2023-04-10 DIAGNOSIS — Z79899 Other long term (current) drug therapy: Secondary | ICD-10-CM | POA: Diagnosis not present

## 2023-04-10 DIAGNOSIS — D509 Iron deficiency anemia, unspecified: Secondary | ICD-10-CM | POA: Diagnosis not present

## 2023-04-10 DIAGNOSIS — J9601 Acute respiratory failure with hypoxia: Secondary | ICD-10-CM | POA: Diagnosis not present

## 2023-04-10 DIAGNOSIS — G9340 Encephalopathy, unspecified: Secondary | ICD-10-CM | POA: Diagnosis not present

## 2023-04-10 DIAGNOSIS — Z9981 Dependence on supplemental oxygen: Secondary | ICD-10-CM | POA: Diagnosis not present

## 2023-04-10 DIAGNOSIS — K219 Gastro-esophageal reflux disease without esophagitis: Secondary | ICD-10-CM | POA: Diagnosis not present

## 2023-04-10 DIAGNOSIS — Z791 Long term (current) use of non-steroidal anti-inflammatories (NSAID): Secondary | ICD-10-CM | POA: Diagnosis not present

## 2023-04-10 DIAGNOSIS — Z79891 Long term (current) use of opiate analgesic: Secondary | ICD-10-CM | POA: Diagnosis not present

## 2023-04-10 DIAGNOSIS — Z7982 Long term (current) use of aspirin: Secondary | ICD-10-CM | POA: Diagnosis not present

## 2023-04-10 DIAGNOSIS — I10 Essential (primary) hypertension: Secondary | ICD-10-CM | POA: Diagnosis not present

## 2023-04-10 DIAGNOSIS — I350 Nonrheumatic aortic (valve) stenosis: Secondary | ICD-10-CM | POA: Diagnosis not present

## 2023-04-10 DIAGNOSIS — H3530 Unspecified macular degeneration: Secondary | ICD-10-CM | POA: Diagnosis not present

## 2023-04-10 DIAGNOSIS — J440 Chronic obstructive pulmonary disease with acute lower respiratory infection: Secondary | ICD-10-CM | POA: Diagnosis not present

## 2023-04-10 DIAGNOSIS — I2510 Atherosclerotic heart disease of native coronary artery without angina pectoris: Secondary | ICD-10-CM | POA: Diagnosis not present

## 2023-04-10 DIAGNOSIS — I700 Atherosclerosis of aorta: Secondary | ICD-10-CM | POA: Diagnosis not present

## 2023-04-10 DIAGNOSIS — G4710 Hypersomnia, unspecified: Secondary | ICD-10-CM | POA: Diagnosis not present

## 2023-04-10 DIAGNOSIS — E441 Mild protein-calorie malnutrition: Secondary | ICD-10-CM | POA: Diagnosis not present

## 2023-04-10 DIAGNOSIS — I739 Peripheral vascular disease, unspecified: Secondary | ICD-10-CM | POA: Diagnosis not present

## 2023-04-11 DIAGNOSIS — Z7982 Long term (current) use of aspirin: Secondary | ICD-10-CM | POA: Diagnosis not present

## 2023-04-11 DIAGNOSIS — I739 Peripheral vascular disease, unspecified: Secondary | ICD-10-CM | POA: Diagnosis not present

## 2023-04-11 DIAGNOSIS — I350 Nonrheumatic aortic (valve) stenosis: Secondary | ICD-10-CM | POA: Diagnosis not present

## 2023-04-11 DIAGNOSIS — G4710 Hypersomnia, unspecified: Secondary | ICD-10-CM | POA: Diagnosis not present

## 2023-04-11 DIAGNOSIS — Z791 Long term (current) use of non-steroidal anti-inflammatories (NSAID): Secondary | ICD-10-CM | POA: Diagnosis not present

## 2023-04-11 DIAGNOSIS — G9340 Encephalopathy, unspecified: Secondary | ICD-10-CM | POA: Diagnosis not present

## 2023-04-11 DIAGNOSIS — Z9981 Dependence on supplemental oxygen: Secondary | ICD-10-CM | POA: Diagnosis not present

## 2023-04-11 DIAGNOSIS — E441 Mild protein-calorie malnutrition: Secondary | ICD-10-CM | POA: Diagnosis not present

## 2023-04-11 DIAGNOSIS — J9601 Acute respiratory failure with hypoxia: Secondary | ICD-10-CM | POA: Diagnosis not present

## 2023-04-11 DIAGNOSIS — Z79891 Long term (current) use of opiate analgesic: Secondary | ICD-10-CM | POA: Diagnosis not present

## 2023-04-11 DIAGNOSIS — I700 Atherosclerosis of aorta: Secondary | ICD-10-CM | POA: Diagnosis not present

## 2023-04-11 DIAGNOSIS — Z79899 Other long term (current) drug therapy: Secondary | ICD-10-CM | POA: Diagnosis not present

## 2023-04-11 DIAGNOSIS — J440 Chronic obstructive pulmonary disease with acute lower respiratory infection: Secondary | ICD-10-CM | POA: Diagnosis not present

## 2023-04-11 DIAGNOSIS — K219 Gastro-esophageal reflux disease without esophagitis: Secondary | ICD-10-CM | POA: Diagnosis not present

## 2023-04-11 DIAGNOSIS — H3530 Unspecified macular degeneration: Secondary | ICD-10-CM | POA: Diagnosis not present

## 2023-04-11 DIAGNOSIS — D509 Iron deficiency anemia, unspecified: Secondary | ICD-10-CM | POA: Diagnosis not present

## 2023-04-11 DIAGNOSIS — I10 Essential (primary) hypertension: Secondary | ICD-10-CM | POA: Diagnosis not present

## 2023-04-11 DIAGNOSIS — I2510 Atherosclerotic heart disease of native coronary artery without angina pectoris: Secondary | ICD-10-CM | POA: Diagnosis not present

## 2023-04-12 DIAGNOSIS — E441 Mild protein-calorie malnutrition: Secondary | ICD-10-CM | POA: Diagnosis not present

## 2023-04-12 DIAGNOSIS — I739 Peripheral vascular disease, unspecified: Secondary | ICD-10-CM | POA: Diagnosis not present

## 2023-04-12 DIAGNOSIS — Z9981 Dependence on supplemental oxygen: Secondary | ICD-10-CM | POA: Diagnosis not present

## 2023-04-12 DIAGNOSIS — Z7982 Long term (current) use of aspirin: Secondary | ICD-10-CM | POA: Diagnosis not present

## 2023-04-12 DIAGNOSIS — Z79891 Long term (current) use of opiate analgesic: Secondary | ICD-10-CM | POA: Diagnosis not present

## 2023-04-12 DIAGNOSIS — H3530 Unspecified macular degeneration: Secondary | ICD-10-CM | POA: Diagnosis not present

## 2023-04-12 DIAGNOSIS — I10 Essential (primary) hypertension: Secondary | ICD-10-CM | POA: Diagnosis not present

## 2023-04-12 DIAGNOSIS — K219 Gastro-esophageal reflux disease without esophagitis: Secondary | ICD-10-CM | POA: Diagnosis not present

## 2023-04-12 DIAGNOSIS — I700 Atherosclerosis of aorta: Secondary | ICD-10-CM | POA: Diagnosis not present

## 2023-04-12 DIAGNOSIS — D509 Iron deficiency anemia, unspecified: Secondary | ICD-10-CM | POA: Diagnosis not present

## 2023-04-12 DIAGNOSIS — Z791 Long term (current) use of non-steroidal anti-inflammatories (NSAID): Secondary | ICD-10-CM | POA: Diagnosis not present

## 2023-04-12 DIAGNOSIS — G4710 Hypersomnia, unspecified: Secondary | ICD-10-CM | POA: Diagnosis not present

## 2023-04-12 DIAGNOSIS — I2510 Atherosclerotic heart disease of native coronary artery without angina pectoris: Secondary | ICD-10-CM | POA: Diagnosis not present

## 2023-04-12 DIAGNOSIS — J9601 Acute respiratory failure with hypoxia: Secondary | ICD-10-CM | POA: Diagnosis not present

## 2023-04-12 DIAGNOSIS — G9340 Encephalopathy, unspecified: Secondary | ICD-10-CM | POA: Diagnosis not present

## 2023-04-12 DIAGNOSIS — Z79899 Other long term (current) drug therapy: Secondary | ICD-10-CM | POA: Diagnosis not present

## 2023-04-12 DIAGNOSIS — I350 Nonrheumatic aortic (valve) stenosis: Secondary | ICD-10-CM | POA: Diagnosis not present

## 2023-04-12 DIAGNOSIS — J440 Chronic obstructive pulmonary disease with acute lower respiratory infection: Secondary | ICD-10-CM | POA: Diagnosis not present

## 2023-04-18 DIAGNOSIS — J9601 Acute respiratory failure with hypoxia: Secondary | ICD-10-CM | POA: Diagnosis not present

## 2023-04-18 DIAGNOSIS — K219 Gastro-esophageal reflux disease without esophagitis: Secondary | ICD-10-CM | POA: Diagnosis not present

## 2023-04-18 DIAGNOSIS — J440 Chronic obstructive pulmonary disease with acute lower respiratory infection: Secondary | ICD-10-CM | POA: Diagnosis not present

## 2023-04-18 DIAGNOSIS — G4710 Hypersomnia, unspecified: Secondary | ICD-10-CM | POA: Diagnosis not present

## 2023-04-18 DIAGNOSIS — Z9981 Dependence on supplemental oxygen: Secondary | ICD-10-CM | POA: Diagnosis not present

## 2023-04-18 DIAGNOSIS — D509 Iron deficiency anemia, unspecified: Secondary | ICD-10-CM | POA: Diagnosis not present

## 2023-04-18 DIAGNOSIS — I700 Atherosclerosis of aorta: Secondary | ICD-10-CM | POA: Diagnosis not present

## 2023-04-18 DIAGNOSIS — I739 Peripheral vascular disease, unspecified: Secondary | ICD-10-CM | POA: Diagnosis not present

## 2023-04-18 DIAGNOSIS — I350 Nonrheumatic aortic (valve) stenosis: Secondary | ICD-10-CM | POA: Diagnosis not present

## 2023-04-18 DIAGNOSIS — H3530 Unspecified macular degeneration: Secondary | ICD-10-CM | POA: Diagnosis not present

## 2023-04-18 DIAGNOSIS — Z791 Long term (current) use of non-steroidal anti-inflammatories (NSAID): Secondary | ICD-10-CM | POA: Diagnosis not present

## 2023-04-18 DIAGNOSIS — Z79891 Long term (current) use of opiate analgesic: Secondary | ICD-10-CM | POA: Diagnosis not present

## 2023-04-18 DIAGNOSIS — Z79899 Other long term (current) drug therapy: Secondary | ICD-10-CM | POA: Diagnosis not present

## 2023-04-18 DIAGNOSIS — G9340 Encephalopathy, unspecified: Secondary | ICD-10-CM | POA: Diagnosis not present

## 2023-04-18 DIAGNOSIS — I10 Essential (primary) hypertension: Secondary | ICD-10-CM | POA: Diagnosis not present

## 2023-04-18 DIAGNOSIS — I2510 Atherosclerotic heart disease of native coronary artery without angina pectoris: Secondary | ICD-10-CM | POA: Diagnosis not present

## 2023-04-18 DIAGNOSIS — E441 Mild protein-calorie malnutrition: Secondary | ICD-10-CM | POA: Diagnosis not present

## 2023-04-18 DIAGNOSIS — Z7982 Long term (current) use of aspirin: Secondary | ICD-10-CM | POA: Diagnosis not present

## 2023-04-19 ENCOUNTER — Encounter: Payer: Self-pay | Admitting: *Deleted

## 2023-04-19 DIAGNOSIS — J9601 Acute respiratory failure with hypoxia: Secondary | ICD-10-CM | POA: Diagnosis not present

## 2023-04-19 DIAGNOSIS — K219 Gastro-esophageal reflux disease without esophagitis: Secondary | ICD-10-CM | POA: Diagnosis not present

## 2023-04-19 DIAGNOSIS — Z9981 Dependence on supplemental oxygen: Secondary | ICD-10-CM | POA: Diagnosis not present

## 2023-04-19 DIAGNOSIS — I10 Essential (primary) hypertension: Secondary | ICD-10-CM | POA: Diagnosis not present

## 2023-04-19 DIAGNOSIS — D509 Iron deficiency anemia, unspecified: Secondary | ICD-10-CM | POA: Diagnosis not present

## 2023-04-19 DIAGNOSIS — H3530 Unspecified macular degeneration: Secondary | ICD-10-CM | POA: Diagnosis not present

## 2023-04-19 DIAGNOSIS — Z7982 Long term (current) use of aspirin: Secondary | ICD-10-CM | POA: Diagnosis not present

## 2023-04-19 DIAGNOSIS — I739 Peripheral vascular disease, unspecified: Secondary | ICD-10-CM | POA: Diagnosis not present

## 2023-04-19 DIAGNOSIS — J440 Chronic obstructive pulmonary disease with acute lower respiratory infection: Secondary | ICD-10-CM | POA: Diagnosis not present

## 2023-04-19 DIAGNOSIS — G9340 Encephalopathy, unspecified: Secondary | ICD-10-CM | POA: Diagnosis not present

## 2023-04-19 DIAGNOSIS — E441 Mild protein-calorie malnutrition: Secondary | ICD-10-CM | POA: Diagnosis not present

## 2023-04-19 DIAGNOSIS — I350 Nonrheumatic aortic (valve) stenosis: Secondary | ICD-10-CM | POA: Diagnosis not present

## 2023-04-19 DIAGNOSIS — Z79891 Long term (current) use of opiate analgesic: Secondary | ICD-10-CM | POA: Diagnosis not present

## 2023-04-19 DIAGNOSIS — G4710 Hypersomnia, unspecified: Secondary | ICD-10-CM | POA: Diagnosis not present

## 2023-04-19 DIAGNOSIS — I700 Atherosclerosis of aorta: Secondary | ICD-10-CM | POA: Diagnosis not present

## 2023-04-19 DIAGNOSIS — Z791 Long term (current) use of non-steroidal anti-inflammatories (NSAID): Secondary | ICD-10-CM | POA: Diagnosis not present

## 2023-04-19 DIAGNOSIS — I2510 Atherosclerotic heart disease of native coronary artery without angina pectoris: Secondary | ICD-10-CM | POA: Diagnosis not present

## 2023-04-19 DIAGNOSIS — Z79899 Other long term (current) drug therapy: Secondary | ICD-10-CM | POA: Diagnosis not present

## 2023-04-24 ENCOUNTER — Telehealth: Payer: Self-pay | Admitting: *Deleted

## 2023-04-24 ENCOUNTER — Other Ambulatory Visit: Payer: Self-pay

## 2023-04-24 DIAGNOSIS — I700 Atherosclerosis of aorta: Secondary | ICD-10-CM | POA: Diagnosis not present

## 2023-04-24 DIAGNOSIS — Z7982 Long term (current) use of aspirin: Secondary | ICD-10-CM | POA: Diagnosis not present

## 2023-04-24 DIAGNOSIS — Z9981 Dependence on supplemental oxygen: Secondary | ICD-10-CM | POA: Diagnosis not present

## 2023-04-24 DIAGNOSIS — I739 Peripheral vascular disease, unspecified: Secondary | ICD-10-CM | POA: Diagnosis not present

## 2023-04-24 DIAGNOSIS — Z79891 Long term (current) use of opiate analgesic: Secondary | ICD-10-CM | POA: Diagnosis not present

## 2023-04-24 DIAGNOSIS — D509 Iron deficiency anemia, unspecified: Secondary | ICD-10-CM | POA: Diagnosis not present

## 2023-04-24 DIAGNOSIS — I350 Nonrheumatic aortic (valve) stenosis: Secondary | ICD-10-CM | POA: Diagnosis not present

## 2023-04-24 DIAGNOSIS — J9601 Acute respiratory failure with hypoxia: Secondary | ICD-10-CM | POA: Diagnosis not present

## 2023-04-24 DIAGNOSIS — H3530 Unspecified macular degeneration: Secondary | ICD-10-CM | POA: Diagnosis not present

## 2023-04-24 DIAGNOSIS — K219 Gastro-esophageal reflux disease without esophagitis: Secondary | ICD-10-CM | POA: Diagnosis not present

## 2023-04-24 DIAGNOSIS — I10 Essential (primary) hypertension: Secondary | ICD-10-CM | POA: Diagnosis not present

## 2023-04-24 DIAGNOSIS — G4710 Hypersomnia, unspecified: Secondary | ICD-10-CM | POA: Diagnosis not present

## 2023-04-24 DIAGNOSIS — G9340 Encephalopathy, unspecified: Secondary | ICD-10-CM | POA: Diagnosis not present

## 2023-04-24 DIAGNOSIS — Z79899 Other long term (current) drug therapy: Secondary | ICD-10-CM | POA: Diagnosis not present

## 2023-04-24 DIAGNOSIS — J440 Chronic obstructive pulmonary disease with acute lower respiratory infection: Secondary | ICD-10-CM | POA: Diagnosis not present

## 2023-04-24 DIAGNOSIS — I2510 Atherosclerotic heart disease of native coronary artery without angina pectoris: Secondary | ICD-10-CM | POA: Diagnosis not present

## 2023-04-24 DIAGNOSIS — E441 Mild protein-calorie malnutrition: Secondary | ICD-10-CM | POA: Diagnosis not present

## 2023-04-24 DIAGNOSIS — Z791 Long term (current) use of non-steroidal anti-inflammatories (NSAID): Secondary | ICD-10-CM | POA: Diagnosis not present

## 2023-04-26 DIAGNOSIS — K219 Gastro-esophageal reflux disease without esophagitis: Secondary | ICD-10-CM | POA: Diagnosis not present

## 2023-04-26 DIAGNOSIS — D509 Iron deficiency anemia, unspecified: Secondary | ICD-10-CM | POA: Diagnosis not present

## 2023-04-26 DIAGNOSIS — I10 Essential (primary) hypertension: Secondary | ICD-10-CM | POA: Diagnosis not present

## 2023-04-26 DIAGNOSIS — Z9981 Dependence on supplemental oxygen: Secondary | ICD-10-CM | POA: Diagnosis not present

## 2023-04-26 DIAGNOSIS — J440 Chronic obstructive pulmonary disease with acute lower respiratory infection: Secondary | ICD-10-CM | POA: Diagnosis not present

## 2023-04-26 DIAGNOSIS — Z79891 Long term (current) use of opiate analgesic: Secondary | ICD-10-CM | POA: Diagnosis not present

## 2023-04-26 DIAGNOSIS — G9340 Encephalopathy, unspecified: Secondary | ICD-10-CM | POA: Diagnosis not present

## 2023-04-26 DIAGNOSIS — Z515 Encounter for palliative care: Secondary | ICD-10-CM | POA: Diagnosis not present

## 2023-04-26 DIAGNOSIS — J449 Chronic obstructive pulmonary disease, unspecified: Secondary | ICD-10-CM | POA: Diagnosis not present

## 2023-04-26 DIAGNOSIS — I739 Peripheral vascular disease, unspecified: Secondary | ICD-10-CM | POA: Diagnosis not present

## 2023-04-26 DIAGNOSIS — Z791 Long term (current) use of non-steroidal anti-inflammatories (NSAID): Secondary | ICD-10-CM | POA: Diagnosis not present

## 2023-04-26 DIAGNOSIS — I350 Nonrheumatic aortic (valve) stenosis: Secondary | ICD-10-CM | POA: Diagnosis not present

## 2023-04-26 DIAGNOSIS — Z7982 Long term (current) use of aspirin: Secondary | ICD-10-CM | POA: Diagnosis not present

## 2023-04-26 DIAGNOSIS — E441 Mild protein-calorie malnutrition: Secondary | ICD-10-CM | POA: Diagnosis not present

## 2023-04-26 DIAGNOSIS — G4710 Hypersomnia, unspecified: Secondary | ICD-10-CM | POA: Diagnosis not present

## 2023-04-26 DIAGNOSIS — Z79899 Other long term (current) drug therapy: Secondary | ICD-10-CM | POA: Diagnosis not present

## 2023-04-26 DIAGNOSIS — I2510 Atherosclerotic heart disease of native coronary artery without angina pectoris: Secondary | ICD-10-CM | POA: Diagnosis not present

## 2023-04-26 DIAGNOSIS — J9601 Acute respiratory failure with hypoxia: Secondary | ICD-10-CM | POA: Diagnosis not present

## 2023-04-26 DIAGNOSIS — H3530 Unspecified macular degeneration: Secondary | ICD-10-CM | POA: Diagnosis not present

## 2023-04-26 DIAGNOSIS — I700 Atherosclerosis of aorta: Secondary | ICD-10-CM | POA: Diagnosis not present

## 2023-04-27 DIAGNOSIS — I2510 Atherosclerotic heart disease of native coronary artery without angina pectoris: Secondary | ICD-10-CM | POA: Diagnosis not present

## 2023-04-27 DIAGNOSIS — D509 Iron deficiency anemia, unspecified: Secondary | ICD-10-CM | POA: Diagnosis not present

## 2023-04-27 DIAGNOSIS — J440 Chronic obstructive pulmonary disease with acute lower respiratory infection: Secondary | ICD-10-CM | POA: Diagnosis not present

## 2023-04-27 DIAGNOSIS — I700 Atherosclerosis of aorta: Secondary | ICD-10-CM | POA: Diagnosis not present

## 2023-04-27 DIAGNOSIS — H3530 Unspecified macular degeneration: Secondary | ICD-10-CM | POA: Diagnosis not present

## 2023-04-27 DIAGNOSIS — Z7982 Long term (current) use of aspirin: Secondary | ICD-10-CM | POA: Diagnosis not present

## 2023-04-27 DIAGNOSIS — I350 Nonrheumatic aortic (valve) stenosis: Secondary | ICD-10-CM | POA: Diagnosis not present

## 2023-04-27 DIAGNOSIS — G4710 Hypersomnia, unspecified: Secondary | ICD-10-CM | POA: Diagnosis not present

## 2023-04-27 DIAGNOSIS — I739 Peripheral vascular disease, unspecified: Secondary | ICD-10-CM | POA: Diagnosis not present

## 2023-04-27 DIAGNOSIS — Z9981 Dependence on supplemental oxygen: Secondary | ICD-10-CM | POA: Diagnosis not present

## 2023-04-27 DIAGNOSIS — K219 Gastro-esophageal reflux disease without esophagitis: Secondary | ICD-10-CM | POA: Diagnosis not present

## 2023-04-27 DIAGNOSIS — Z791 Long term (current) use of non-steroidal anti-inflammatories (NSAID): Secondary | ICD-10-CM | POA: Diagnosis not present

## 2023-04-27 DIAGNOSIS — Z79891 Long term (current) use of opiate analgesic: Secondary | ICD-10-CM | POA: Diagnosis not present

## 2023-04-27 DIAGNOSIS — J9601 Acute respiratory failure with hypoxia: Secondary | ICD-10-CM | POA: Diagnosis not present

## 2023-04-27 DIAGNOSIS — I10 Essential (primary) hypertension: Secondary | ICD-10-CM | POA: Diagnosis not present

## 2023-04-27 DIAGNOSIS — G9340 Encephalopathy, unspecified: Secondary | ICD-10-CM | POA: Diagnosis not present

## 2023-04-27 DIAGNOSIS — E441 Mild protein-calorie malnutrition: Secondary | ICD-10-CM | POA: Diagnosis not present

## 2023-04-27 DIAGNOSIS — Z79899 Other long term (current) drug therapy: Secondary | ICD-10-CM | POA: Diagnosis not present

## 2023-04-28 ENCOUNTER — Encounter (HOSPITAL_COMMUNITY): Payer: Self-pay | Admitting: Emergency Medicine

## 2023-04-28 ENCOUNTER — Emergency Department (HOSPITAL_COMMUNITY)

## 2023-04-28 ENCOUNTER — Other Ambulatory Visit (HOSPITAL_COMMUNITY): Payer: Self-pay | Admitting: Internal Medicine

## 2023-04-28 ENCOUNTER — Emergency Department (HOSPITAL_COMMUNITY)
Admission: EM | Admit: 2023-04-28 | Discharge: 2023-04-28 | Disposition: A | Discharge status: Home / Self Care | Attending: Emergency Medicine | Admitting: Emergency Medicine

## 2023-04-28 ENCOUNTER — Other Ambulatory Visit: Payer: Self-pay

## 2023-04-28 DIAGNOSIS — M542 Cervicalgia: Secondary | ICD-10-CM | POA: Diagnosis not present

## 2023-04-28 DIAGNOSIS — N3000 Acute cystitis without hematuria: Secondary | ICD-10-CM | POA: Diagnosis not present

## 2023-04-28 DIAGNOSIS — R0682 Tachypnea, not elsewhere classified: Secondary | ICD-10-CM | POA: Diagnosis not present

## 2023-04-28 DIAGNOSIS — R0602 Shortness of breath: Secondary | ICD-10-CM | POA: Insufficient documentation

## 2023-04-28 DIAGNOSIS — K449 Diaphragmatic hernia without obstruction or gangrene: Secondary | ICD-10-CM | POA: Diagnosis not present

## 2023-04-28 DIAGNOSIS — Z79899 Other long term (current) drug therapy: Secondary | ICD-10-CM | POA: Insufficient documentation

## 2023-04-28 DIAGNOSIS — R0902 Hypoxemia: Secondary | ICD-10-CM | POA: Diagnosis not present

## 2023-04-28 DIAGNOSIS — I2510 Atherosclerotic heart disease of native coronary artery without angina pectoris: Secondary | ICD-10-CM | POA: Insufficient documentation

## 2023-04-28 DIAGNOSIS — J449 Chronic obstructive pulmonary disease, unspecified: Secondary | ICD-10-CM | POA: Diagnosis not present

## 2023-04-28 DIAGNOSIS — I10 Essential (primary) hypertension: Secondary | ICD-10-CM | POA: Insufficient documentation

## 2023-04-28 DIAGNOSIS — E860 Dehydration: Secondary | ICD-10-CM | POA: Insufficient documentation

## 2023-04-28 DIAGNOSIS — R062 Wheezing: Secondary | ICD-10-CM | POA: Diagnosis not present

## 2023-04-28 DIAGNOSIS — Z7982 Long term (current) use of aspirin: Secondary | ICD-10-CM | POA: Insufficient documentation

## 2023-04-28 DIAGNOSIS — Z743 Need for continuous supervision: Secondary | ICD-10-CM | POA: Diagnosis not present

## 2023-04-28 DIAGNOSIS — R159 Full incontinence of feces: Secondary | ICD-10-CM | POA: Diagnosis not present

## 2023-04-28 DIAGNOSIS — J811 Chronic pulmonary edema: Secondary | ICD-10-CM | POA: Diagnosis not present

## 2023-04-28 DIAGNOSIS — R109 Unspecified abdominal pain: Secondary | ICD-10-CM | POA: Diagnosis not present

## 2023-04-28 DIAGNOSIS — R197 Diarrhea, unspecified: Secondary | ICD-10-CM | POA: Diagnosis present

## 2023-04-28 DIAGNOSIS — Z8701 Personal history of pneumonia (recurrent): Secondary | ICD-10-CM

## 2023-04-28 LAB — BASIC METABOLIC PANEL WITH GFR
Anion gap: 7 (ref 5–15)
BUN: 14 mg/dL (ref 8–23)
CO2: 30 mmol/L (ref 22–32)
Calcium: 9.1 mg/dL (ref 8.9–10.3)
Chloride: 98 mmol/L (ref 98–111)
Creatinine, Ser: 0.59 mg/dL (ref 0.44–1.00)
GFR, Estimated: >60 mL/min (ref >60)
Glucose, Bld: 132 mg/dL — ABNORMAL HIGH (ref 70–99)
Potassium: 4.6 mmol/L (ref 3.5–5.1)
Sodium: 135 mmol/L (ref 135–145)

## 2023-04-28 LAB — CBC WITH DIFFERENTIAL/PLATELET
Abs Immature Granulocytes: 0.01 10*3/uL (ref 0.00–0.07)
Basophils Absolute: 0 10*3/uL (ref 0.0–0.1)
Basophils Relative: 0 %
Eosinophils Absolute: 0.4 10*3/uL (ref 0.0–0.5)
Eosinophils Relative: 5 %
HCT: 35 % — ABNORMAL LOW (ref 36.0–46.0)
Hemoglobin: 11.2 g/dL — ABNORMAL LOW (ref 12.0–15.0)
Immature Granulocytes: 0 %
Lymphocytes Relative: 19 %
Lymphs Abs: 1.6 10*3/uL (ref 0.7–4.0)
MCH: 29.8 pg (ref 26.0–34.0)
MCHC: 32 g/dL (ref 30.0–36.0)
MCV: 93.1 fL (ref 80.0–100.0)
Monocytes Absolute: 0.7 10*3/uL (ref 0.1–1.0)
Monocytes Relative: 9 %
Neutro Abs: 5.7 10*3/uL (ref 1.7–7.7)
Neutrophils Relative %: 67 %
Platelets: 195 10*3/uL (ref 150–400)
RBC: 3.76 MIL/uL — ABNORMAL LOW (ref 3.87–5.11)
RDW: 16.4 % — ABNORMAL HIGH (ref 11.5–15.5)
WBC: 8.5 10*3/uL (ref 4.0–10.5)
nRBC: 0 % (ref 0.0–0.2)

## 2023-04-28 LAB — RESP PANEL BY RT-PCR (RSV, FLU A&B, COVID)  RVPGX2
Influenza A by PCR: NEGATIVE
Influenza B by PCR: NEGATIVE
Resp Syncytial Virus by PCR: NEGATIVE
SARS Coronavirus 2 by RT PCR: NEGATIVE

## 2023-04-28 LAB — URINALYSIS, ROUTINE W REFLEX MICROSCOPIC
Bilirubin Urine: NEGATIVE
Glucose, UA: NEGATIVE mg/dL
Ketones, ur: NEGATIVE mg/dL
Nitrite: POSITIVE — AB
Protein, ur: 30 mg/dL — AB
Specific Gravity, Urine: 1.013 (ref 1.005–1.030)
pH: 7 (ref 5.0–8.0)

## 2023-04-28 LAB — BRAIN NATRIURETIC PEPTIDE: B Natriuretic Peptide: 124 pg/mL — ABNORMAL HIGH (ref 0.0–100.0)

## 2023-04-28 LAB — TROPONIN I (HIGH SENSITIVITY)
Troponin I (High Sensitivity): 4 ng/L (ref <18)
Troponin I (High Sensitivity): 4 ng/L (ref <18)

## 2023-04-28 MED ORDER — IOHEXOL 300 MG/ML  SOLN
100.0000 mL | Freq: Once | INTRAMUSCULAR | Status: AC | PRN
  Administered 2023-04-28: 100 mL via INTRAVENOUS

## 2023-04-28 MED ORDER — SODIUM CHLORIDE 0.9 % IV SOLN
1.0000 g | Freq: Once | INTRAVENOUS | Status: AC
  Administered 2023-04-28: 1 g via INTRAVENOUS
  Filled 2023-04-28: qty 10

## 2023-04-28 MED ORDER — AEROCHAMBER PLUS FLO-VU MEDIUM MISC
1.0000 | Freq: Once | Status: DC
  Filled 2023-04-28 (×2): qty 1

## 2023-04-28 MED ORDER — CEPHALEXIN 500 MG PO CAPS: 500.0000 mg | ORAL_CAPSULE | Freq: Three times a day (TID) | ORAL | 0 refills | Status: DC

## 2023-04-28 MED ORDER — ALBUTEROL SULFATE (5 MG/ML) 0.5% IN NEBU: 2.5000 mg | INHALATION_SOLUTION | Freq: Four times a day (QID) | RESPIRATORY_TRACT | 12 refills | Status: DC | PRN

## 2023-04-28 MED ORDER — SODIUM CHLORIDE 0.9 % IV BOLUS
1000.0000 mL | Freq: Once | INTRAVENOUS | Status: AC
  Administered 2023-04-28: 1000 mL via INTRAVENOUS

## 2023-04-28 MED ORDER — ALBUTEROL SULFATE HFA 108 (90 BASE) MCG/ACT IN AERS
1.0000 | INHALATION_SPRAY | RESPIRATORY_TRACT | Status: DC | PRN
  Administered 2023-04-28: 2 via RESPIRATORY_TRACT
  Filled 2023-04-28: qty 6.7

## 2023-04-28 NOTE — ED Provider Notes (Signed)
 [place] EMERGENCY DEPARTMENT AT [facility] Provider Note   CSN: [id number] Arrival date & time: 04/28/23  1442     History  Chief Complaint  Patient presents with   Diarrhea    Beverly [name] is a 89 y.o. female.  Pt is a 89 yo female with pmhx significant for copd with chronic 2L via [place], gerd, cad, and htn.  Pt was brought here by EMS because family was concerned her O2 sats and bp was dropping.  Both are normal for EMS and upon arrival here.   Pt has had some diarrhea.  Pt has neck pain, but that is chronic.  No new pain today.  No fevers/cough.  Pt's family is here now.  They said pt has been very sob when ambulating.  O2 sat drops to 85% when she goes to the bathroom.  Home health has been concerned about "noises" they hear in her lungs.  Family said she's been more weak than usual.  This is how she gets when she has an infection.         Home Medications Prior to Admission medications   Medication Sig Start Date End Date Taking? Authorizing Provider  albuterol  (PROVENTIL ) (5 MG/ML) 0.5% nebulizer solution Take 0.5 mLs (2.5 mg total) by nebulization every 6 (six) hours as needed for wheezing or shortness of breath. 04/28/23  Yes Korban Shearer, MD  cephALEXin (KEFLEX) 500 MG capsule Take 1 capsule (500 mg total) by mouth 3 (three) times daily. 04/28/23  Yes Sueellen Emery, MD  acetaminophen  (TYLENOL ) 500 MG tablet Take 500 mg by mouth every 4 (four) hours.    [provider]  ALPRAZolam  (XANAX ) 0.5 MG tablet Take 1 tablet (0.5 mg total) by mouth 2 (two) times daily as needed for anxiety or sleep. 03/21/23   Marilyne Shu, NP  amLODipine  (NORVASC ) 10 MG tablet Take 1 tablet (10 mg total) by mouth daily. 03/21/23   Marilyne Shu, NP  aspirin  EC 81 MG tablet Take 1 tablet (81 mg total) by mouth daily with breakfast. 07/18/22 07/18/23  Colin Dawley, MD  atorvastatin  (LIPITOR ) 80 MG tablet Take 1 tablet (80 mg total) by mouth daily at 6 PM. 03/21/23    Marrie Sizer, Marcellus Sers, NP  carvedilol  (COREG ) 12.5 MG tablet Take 1 tablet (12.5 mg total) by mouth 2 (two) times daily. 03/21/23   Marilyne Shu, NP  celecoxib  (CELEBREX ) 200 MG capsule Take 1 capsule (200 mg total) by mouth 2 (two) times daily. 03/21/23   Marilyne Shu, NP  Cholecalciferol  25 MCG (1000 UT) TBDP Take 1,000 Units by mouth daily.    [provider]  diclofenac Sodium (VOLTAREN) 1 % GEL Apply 2 g topically 2 (two) times daily as needed.    [provider]  famotidine  (PEPCID ) 20 MG tablet Take 20 mg by mouth daily. 06/30/22   [provider]  fluticasone  (FLONASE ) 50 MCG/ACT nasal spray Place 2 sprays into both nostrils daily.    [provider]  gabapentin  (NEURONTIN ) 100 MG capsule Take 1 capsule (100 mg total) by mouth at bedtime. 03/21/23   Marilyne Shu, NP  guaiFENesin  (MUCINEX ) 600 MG 12 hr tablet Take 1 tablet (600 mg total) by mouth 2 (two) times daily. 07/18/22 07/18/23  Colin Dawley, MD  HYDROcodone -acetaminophen  (NORCO/VICODIN) 5-325 MG tablet Take 1 tablet by mouth 3 (three) times daily as needed for moderate pain (pain score 4-6). 03/21/23   Marilyne Shu, NP  latanoprost  (XALATAN ) 0.005 %  ophthalmic solution INSTILL ONE DROP IN BOTH EYES NIGHTLY 03/21/23   Marilyne Shu, NP  levETIRAcetam  (KEPPRA ) 500 MG tablet Take 1 tablet (500 mg total) by mouth 2 (two) times daily. 03/21/23   Marilyne Shu, NP  memantine  (NAMENDA ) 5 MG tablet Take 1 tablet (5 mg total) by mouth 2 (two) times daily. 03/21/23   Marilyne Shu, NP  nitroGLYCERIN  (NITROSTAT ) 0.4 MG SL tablet Place 1 tablet (0.4 mg total) under the tongue every 5 (five) minutes x 3 doses as needed for chest pain. 03/21/23   Marilyne Shu, NP  Nutritional Supplements (ENSURE ENLIVE PO) Take 237 mLs by mouth daily.    [provider]  omeprazole  (PRILOSEC) 40 MG capsule Take 1 capsule (40 mg total) by mouth daily. 03/21/23   Marilyne Shu, NP  Propylene Glycol  (SYSTANE BALANCE OP) Place 2 drops into both eyes daily as needed (dry eyes).    [provider]  risperiDONE  (RISPERDAL ) 0.5 MG tablet Take 1 tablet (0.5 mg total) by mouth 2 (two) times daily. 03/21/23   Marilyne Shu, NP  sennosides-docusate sodium  (SENOKOT-S) 8.6-50 MG tablet Take 1 tablet by mouth daily as needed for constipation.    [provider]  sertraline  (ZOLOFT ) 50 MG tablet Take 1 tablet (50 mg total) by mouth daily. 03/21/23   Marilyne Shu, NP  lisinopril  (ZESTRIL ) 20 MG tablet Take 1 tablet (20 mg total) by mouth 2 (two) times daily. 01/24/19 05/03/19  Strader, Dimple Francis, PA-C      Allergies    Lisinopril , Egg-derived products, Morphine  and codeine, and Penicillins    Review of Systems   Review of Systems  Gastrointestinal:  Positive for diarrhea.  All other systems reviewed and are negative.   Physical Exam Updated Vital Signs BP 129/61   Pulse 77   Temp 98.1 F (36.7 C)   Resp 11   Ht 5\' 5"  (1.651 m)   Wt 62 kg   SpO2 98%   BMI 22.75 kg/m  Physical Exam Vitals and nursing note reviewed.  Constitutional:      Appearance: Normal appearance.  HENT:     Head: Normocephalic and atraumatic.     Right Ear: External ear normal.     Left Ear: External ear normal.     Nose: Nose normal.     Mouth/Throat:     Mouth: Mucous membranes are moist.     Pharynx: Oropharynx is clear.  Eyes:     Extraocular Movements: Extraocular movements intact.     Conjunctiva/sclera: Conjunctivae normal.     Pupils: Pupils are equal, round, and reactive to light.  Cardiovascular:     Rate and Rhythm: Normal rate and regular rhythm.     Pulses: Normal pulses.     Heart sounds: Normal heart sounds.  Pulmonary:     Effort: Pulmonary effort is normal.     Breath sounds: Normal breath sounds.  Abdominal:     General: Abdomen is flat. Bowel sounds are normal.     Palpations: Abdomen is soft.  Musculoskeletal:        General: Normal range of motion.      Cervical back: Normal range of motion and neck supple.  Skin:    General: Skin is warm.     Capillary Refill: Capillary refill takes less than 2 seconds.  Neurological:     General: No focal deficit present.     Mental Status: She is alert and oriented to person, place,  and time.  Psychiatric:        Mood and Affect: Mood normal.        Behavior: Behavior normal.     ED Results / Procedures / Treatments   Labs (all labs ordered are listed, but only abnormal results are displayed) Labs Reviewed  BASIC METABOLIC PANEL WITH GFR - Abnormal; Notable for the following components:      Result Value   Glucose, Bld 132 (*)    All other components within normal limits  BRAIN NATRIURETIC PEPTIDE - Abnormal; Notable for the following components:   B Natriuretic Peptide 124.0 (*)    All other components within normal limits  CBC WITH DIFFERENTIAL/PLATELET - Abnormal; Notable for the following components:   RBC 3.76 (*)    Hemoglobin 11.2 (*)    HCT 35.0 (*)    RDW 16.4 (*)    All other components within normal limits  URINALYSIS, ROUTINE W REFLEX MICROSCOPIC - Abnormal; Notable for the following components:   APPearance HAZY (*)    Hgb urine dipstick SMALL (*)    Protein, ur 30 (*)    Nitrite POSITIVE (*)    Leukocytes,Ua SMALL (*)    Bacteria, UA RARE (*)    All other components within normal limits  RESP PANEL BY RT-PCR (RSV, FLU A&B, COVID)  [id number]  GASTROINTESTINAL PANEL BY PCR, STOOL (REPLACES STOOL CULTURE)  C DIFFICILE QUICK SCREEN W PCR REFLEX    TROPONIN I (HIGH SENSITIVITY)  TROPONIN I (HIGH SENSITIVITY)    EKG EKG Interpretation Date/Time:  Friday April 28 2023 15:06:55 EDT Ventricular Rate:  66 PR Interval:  211 QRS Duration:  88 QT Interval:  388 QTC Calculation: 407 R Axis:   52  Text Interpretation: Sinus rhythm Confirmed by Sueellen Emery [phone]) on 04/28/2023 3:11:11 PM  Radiology CT CHEST ABDOMEN PELVIS W CONTRAST Result Date: 04/28/2023 CLINICAL DATA:   Abdominal pain, acute, nonlocalized diarrhea pt O2 and BP drop intermittently at home, EMS saw no signs of same. Pt uses 2L [place] chronically. Family reports several days of diarrhea. EXAM: CT CHEST, ABDOMEN, AND PELVIS WITH CONTRAST TECHNIQUE: Multidetector CT imaging of the chest, abdomen and pelvis was performed following the standard protocol during bolus administration of intravenous contrast. RADIATION DOSE REDUCTION: This exam was performed according to the departmental dose-optimization program which includes automated exposure control, adjustment of the mA and/or kV according to patient size and/or use of iterative reconstruction technique. CONTRAST:  OMNIPAQUE  IOHEXOL  300 MG/ML  SOLN COMPARISON:  CT chest 03/01/2023 FINDINGS: CT CHEST FINDINGS Cardiovascular: Prominent heart size. No significant pericardial effusion. The thoracic aorta is normal in caliber. Severe atherosclerotic plaque of the thoracic aorta. Two vessel coronary artery calcifications. Mitral annular calcification. Aortic valve leaflet calcification. No central pulmonary embolus. Limited evaluation more distally due to timing of contrast. Mediastinum/Nodes: No enlarged mediastinal, hilar, or axillary lymph nodes. Thyroid  gland, trachea, and esophagus demonstrate no significant findings. Large hiatal hernia containing the majority of the stomach. Lungs/Pleura: Mild interlobular septal wall thickening. Diffuse bronchial wall thickening. Left upper lobe anterior reticulations. No focal consolidation. No pulmonary nodule. No pulmonary mass. No pleural effusion. No pneumothorax. Musculoskeletal: No chest wall abnormality. No suspicious lytic or blastic osseous lesions. Chronic sternal fracture. No acute displaced fracture. CT ABDOMEN PELVIS FINDINGS Hepatobiliary: No focal liver abnormality. No gallstones, gallbladder wall thickening, or pericholecystic fluid. No biliary dilatation. Pancreas: No focal lesion. Normal pancreatic contour. No  surrounding inflammatory changes. No main pancreatic ductal dilatation. Spleen: Normal in size  without focal abnormality. Adrenals/Urinary Tract: No adrenal nodule bilaterally. Bilateral kidneys enhance symmetrically. No hydronephrosis. No hydroureter. Mild right urothelial thickening. No nephroureterolithiasis bilaterally. The urinary bladder is unremarkable. Stomach/Bowel: Stomach is within normal limits and decompressed. No pneumatosis. No evidence of bowel wall thickening or dilatation. Appendix appears normal. Vascular/Lymphatic: No abdominal aorta or iliac aneurysm. Severe atherosclerotic plaque of the aorta and its branches. No abdominal, pelvic, or inguinal lymphadenopathy. Reproductive: Status post hysterectomy. No adnexal masses. Other: No intraperitoneal free fluid. No intraperitoneal free gas. No organized fluid collection. Musculoskeletal: No abdominal wall hernia or abnormality. No suspicious lytic or blastic osseous lesions. No acute displaced fracture. Levoscoliosis of the thoracolumbar spine. Associated multilevel severe degenerative changes of the spine. Moderate to severe degenerative changes of the bilateral hips. IMPRESSION: 1. Mild pulmonary edema. 2. Bronchitic changes. 3. Large hiatal hernia. 4. Mild right urothelial thickening-correlate with urinalysis for infection. 5. Aortic Atherosclerosis (ICD10-I70.0)-including at least 2 vessel coronary artery, mitral annular, and aortic valve leaflet calcifications-correlate for aortic stenosis. Electronically Signed   By: Morgane  Naveau M.D.   On: 04/28/2023 19:15   DG Chest Port 1 View Result Date: 04/28/2023 CLINICAL DATA:  Shortness of breath. EXAM: PORTABLE CHEST 1 VIEW COMPARISON:  March 01, 2023. FINDINGS: Stable cardiomediastinal silhouette. Moderate size hiatal hernia is noted. Stable diffuse reticular densities are noted most consistent with scarring. No definite acute abnormality is noted. Bony thorax is unremarkable. IMPRESSION: No  acute cardiopulmonary abnormality seen.  Stable hiatal hernia. Electronically Signed   By: Rosalene Colon M.D.   On: 04/28/2023 16:19    Procedures Procedures    Medications Ordered in ED Medications  cefTRIAXone  (ROCEPHIN ) 1 g in sodium chloride  0.9 % 100 mL IVPB (1 g Intravenous New Bag/Given 04/28/23 1949)  albuterol  (VENTOLIN  HFA) 108 (90 Base) MCG/ACT inhaler 1-2 puff (has no administration in time range)  AeroChamber Plus Flo-Vu Medium MISC 1 each (has no administration in time range)  sodium chloride  0.9 % bolus 1,000 mL (0 mLs Intravenous Stopped 04/28/23 1736)  iohexol  (OMNIPAQUE ) 300 MG/ML solution 100 mL (100 mLs Intravenous Contrast Given 04/28/23 1803)    ED Course/ Medical Decision Making/ A&P                                 Medical Decision Making Amount and/or Complexity of Data Reviewed Labs: ordered. Radiology: ordered.  Risk Prescription drug management.   This patient presents to the ED for concern of diarrhea, this involves an extensive number of treatment options, and is a complaint that carries with it a high risk of complications and morbidity.  The differential diagnosis includes viral, c diff, bacterial, other infection,    Co morbidities that complicate the patient evaluation  copd with chronic 2L via [place], gerd, cad, and htn   Additional history obtained:  Additional history obtained from epic chart review External records from outside source obtained and reviewed including EMS report   Lab Tests:  I Ordered, and personally interpreted labs.  The pertinent results include:  cbc with hgb sl low at 11.2 (12.7 on 2/28), bmp nl, trop nl; covid/flu/rsv neg; bnp 124; ua + uti   Imaging Studies ordered:  I ordered imaging studies including cxr, ct chest/abd/pelvis I independently visualized and interpreted imaging which showed  CXR: No acute cardiopulmonary abnormality seen.  Stable hiatal hernia.  CT chest/abd/pelvis: Mild pulmonary edema.  2.  Bronchitic changes.  3. Large hiatal hernia.  4. Mild right urothelial thickening-correlate with urinalysis for  infection.  5. Aortic Atherosclerosis (ICD10-I70.0)-including at least 2 vessel  coronary artery, mitral annular, and aortic valve leaflet  calcifications-correlate for aortic stenosis.   I agree with the radiologist interpretation   Cardiac Monitoring:  The patient was maintained on a cardiac monitor.  I personally viewed and interpreted the cardiac monitored which showed an underlying rhythm of: nsr   Medicines ordered and prescription drug management:  I ordered medication including ivfs  for sx  Reevaluation of the patient after these medicines showed that the patient improved I have reviewed the patients home medicines and have made adjustments as needed   Test Considered:  ct   Critical Interventions:  abx   Problem List / ED Course:  Hx diarrhea:  pt's nurse reports formed stool.  So, stool was not sent for eval.  Family said pt has had fecal incontinence.  Pt referred to GI. UTI:  likely from the fecal incontinence.  Pt given rocephin  in ed and d/c with keflex.  She has no signs of sepsis.  She has f/u with her pcp on Monday the 28th. SOB:  chronic.  Pt does not have albuterol  mdi or nebs at home, so I have given her an inhaler + spacer here.  She is given a rx for a neb machine + albuterol  neb soln for home.    Reevaluation:  After the interventions noted above, I reevaluated the patient and found that they have :improved   Social Determinants of Health:  Lives at home   Dispostion:  After consideration of the diagnostic results and the patients response to treatment, I feel that the patent would benefit from discharge with outpatient f/u.  Return if worse.         Final Clinical Impression(s) / ED Diagnoses Final diagnoses:  Acute cystitis without hematuria  Dehydration  Full incontinence of feces    Rx / DC Orders ED Discharge  Orders          Ordered    Ambulatory referral to Gastroenterology        04/28/23 1955    For home use only DME Nebulizer machine        04/28/23 1956    albuterol  (PROVENTIL ) (5 MG/ML) 0.5% nebulizer solution  Every 6 hours PRN        04/28/23 1956    cephALEXin (KEFLEX) 500 MG capsule  3 times daily        04/28/23 1957              Sueellen Emery, MD 04/28/23 [phone]

## 2023-04-28 NOTE — ED Triage Notes (Signed)
 Pt to ER via EMS from home.  Pt reports no c/o to EMS or ED staff. Per EMS family reports that pt O2 and BP drop intermittently at home, EMS saw no signs of same.  Pt uses 2L [place] chronically.  Family reports several days of diarrhea.

## 2023-04-28 NOTE — ED Notes (Signed)
 Went over d/c paperwork at this time with patient. Pt had no questions, comments or concerns after review and verbally understood them.   Pt was given a spacer and teachings of spacer were completed by this nurse. Pt's daughter was present during teachings.

## 2023-04-29 ENCOUNTER — Other Ambulatory Visit: Payer: Self-pay | Admitting: Adult Health

## 2023-05-01 ENCOUNTER — Other Ambulatory Visit: Payer: Self-pay | Admitting: Adult Health

## 2023-05-01 DIAGNOSIS — I10 Essential (primary) hypertension: Secondary | ICD-10-CM | POA: Diagnosis not present

## 2023-05-01 DIAGNOSIS — E782 Mixed hyperlipidemia: Secondary | ICD-10-CM | POA: Diagnosis not present

## 2023-05-01 DIAGNOSIS — Z87440 Personal history of urinary (tract) infections: Secondary | ICD-10-CM | POA: Diagnosis not present

## 2023-05-01 DIAGNOSIS — R0602 Shortness of breath: Secondary | ICD-10-CM | POA: Diagnosis not present

## 2023-05-01 DIAGNOSIS — J9610 Chronic respiratory failure, unspecified whether with hypoxia or hypercapnia: Secondary | ICD-10-CM | POA: Diagnosis not present

## 2023-05-01 DIAGNOSIS — J690 Pneumonitis due to inhalation of food and vomit: Secondary | ICD-10-CM | POA: Diagnosis not present

## 2023-05-01 DIAGNOSIS — Z79899 Other long term (current) drug therapy: Secondary | ICD-10-CM | POA: Diagnosis not present

## 2023-05-03 DIAGNOSIS — G9340 Encephalopathy, unspecified: Secondary | ICD-10-CM | POA: Diagnosis not present

## 2023-05-03 DIAGNOSIS — J440 Chronic obstructive pulmonary disease with acute lower respiratory infection: Secondary | ICD-10-CM | POA: Diagnosis not present

## 2023-05-03 DIAGNOSIS — K219 Gastro-esophageal reflux disease without esophagitis: Secondary | ICD-10-CM | POA: Diagnosis not present

## 2023-05-03 DIAGNOSIS — I350 Nonrheumatic aortic (valve) stenosis: Secondary | ICD-10-CM | POA: Diagnosis not present

## 2023-05-03 DIAGNOSIS — Z791 Long term (current) use of non-steroidal anti-inflammatories (NSAID): Secondary | ICD-10-CM | POA: Diagnosis not present

## 2023-05-03 DIAGNOSIS — I700 Atherosclerosis of aorta: Secondary | ICD-10-CM | POA: Diagnosis not present

## 2023-05-03 DIAGNOSIS — I10 Essential (primary) hypertension: Secondary | ICD-10-CM | POA: Diagnosis not present

## 2023-05-03 DIAGNOSIS — H3530 Unspecified macular degeneration: Secondary | ICD-10-CM | POA: Diagnosis not present

## 2023-05-03 DIAGNOSIS — G4710 Hypersomnia, unspecified: Secondary | ICD-10-CM | POA: Diagnosis not present

## 2023-05-03 DIAGNOSIS — Z79891 Long term (current) use of opiate analgesic: Secondary | ICD-10-CM | POA: Diagnosis not present

## 2023-05-03 DIAGNOSIS — Z79899 Other long term (current) drug therapy: Secondary | ICD-10-CM | POA: Diagnosis not present

## 2023-05-03 DIAGNOSIS — I739 Peripheral vascular disease, unspecified: Secondary | ICD-10-CM | POA: Diagnosis not present

## 2023-05-03 DIAGNOSIS — I2510 Atherosclerotic heart disease of native coronary artery without angina pectoris: Secondary | ICD-10-CM | POA: Diagnosis not present

## 2023-05-03 DIAGNOSIS — E441 Mild protein-calorie malnutrition: Secondary | ICD-10-CM | POA: Diagnosis not present

## 2023-05-03 DIAGNOSIS — Z9981 Dependence on supplemental oxygen: Secondary | ICD-10-CM | POA: Diagnosis not present

## 2023-05-03 DIAGNOSIS — Z7982 Long term (current) use of aspirin: Secondary | ICD-10-CM | POA: Diagnosis not present

## 2023-05-03 DIAGNOSIS — D509 Iron deficiency anemia, unspecified: Secondary | ICD-10-CM | POA: Diagnosis not present

## 2023-05-03 DIAGNOSIS — J9601 Acute respiratory failure with hypoxia: Secondary | ICD-10-CM | POA: Diagnosis not present

## 2023-05-04 DIAGNOSIS — I739 Peripheral vascular disease, unspecified: Secondary | ICD-10-CM | POA: Diagnosis not present

## 2023-05-04 DIAGNOSIS — I2510 Atherosclerotic heart disease of native coronary artery without angina pectoris: Secondary | ICD-10-CM | POA: Diagnosis not present

## 2023-05-04 DIAGNOSIS — Z7982 Long term (current) use of aspirin: Secondary | ICD-10-CM | POA: Diagnosis not present

## 2023-05-04 DIAGNOSIS — Z79899 Other long term (current) drug therapy: Secondary | ICD-10-CM | POA: Diagnosis not present

## 2023-05-04 DIAGNOSIS — Z9981 Dependence on supplemental oxygen: Secondary | ICD-10-CM | POA: Diagnosis not present

## 2023-05-04 DIAGNOSIS — I350 Nonrheumatic aortic (valve) stenosis: Secondary | ICD-10-CM | POA: Diagnosis not present

## 2023-05-04 DIAGNOSIS — J449 Chronic obstructive pulmonary disease, unspecified: Secondary | ICD-10-CM | POA: Diagnosis not present

## 2023-05-04 DIAGNOSIS — 419620001 Death: Secondary | SNOMED CT | POA: Diagnosis not present

## 2023-05-04 DIAGNOSIS — Z79891 Long term (current) use of opiate analgesic: Secondary | ICD-10-CM | POA: Diagnosis not present

## 2023-05-04 DIAGNOSIS — Z791 Long term (current) use of non-steroidal anti-inflammatories (NSAID): Secondary | ICD-10-CM | POA: Diagnosis not present

## 2023-05-04 DIAGNOSIS — K219 Gastro-esophageal reflux disease without esophagitis: Secondary | ICD-10-CM | POA: Diagnosis not present

## 2023-05-04 DIAGNOSIS — H3530 Unspecified macular degeneration: Secondary | ICD-10-CM | POA: Diagnosis not present

## 2023-05-04 DIAGNOSIS — G4710 Hypersomnia, unspecified: Secondary | ICD-10-CM | POA: Diagnosis not present

## 2023-05-04 DIAGNOSIS — I10 Essential (primary) hypertension: Secondary | ICD-10-CM | POA: Diagnosis not present

## 2023-05-04 DIAGNOSIS — G9340 Encephalopathy, unspecified: Secondary | ICD-10-CM | POA: Diagnosis not present

## 2023-05-04 DIAGNOSIS — D509 Iron deficiency anemia, unspecified: Secondary | ICD-10-CM | POA: Diagnosis not present

## 2023-05-04 DIAGNOSIS — J440 Chronic obstructive pulmonary disease with acute lower respiratory infection: Secondary | ICD-10-CM | POA: Diagnosis not present

## 2023-05-04 DIAGNOSIS — I700 Atherosclerosis of aorta: Secondary | ICD-10-CM | POA: Diagnosis not present

## 2023-05-04 DIAGNOSIS — E441 Mild protein-calorie malnutrition: Secondary | ICD-10-CM | POA: Diagnosis not present

## 2023-05-04 DIAGNOSIS — J9601 Acute respiratory failure with hypoxia: Secondary | ICD-10-CM | POA: Diagnosis not present

## 2023-05-04 DEATH — deceased

## 2023-05-10 DIAGNOSIS — I700 Atherosclerosis of aorta: Secondary | ICD-10-CM | POA: Diagnosis not present

## 2023-05-10 DIAGNOSIS — J440 Chronic obstructive pulmonary disease with acute lower respiratory infection: Secondary | ICD-10-CM | POA: Diagnosis not present

## 2023-05-10 DIAGNOSIS — G4710 Hypersomnia, unspecified: Secondary | ICD-10-CM | POA: Diagnosis not present

## 2023-05-10 DIAGNOSIS — Z791 Long term (current) use of non-steroidal anti-inflammatories (NSAID): Secondary | ICD-10-CM | POA: Diagnosis not present

## 2023-05-10 DIAGNOSIS — Z9981 Dependence on supplemental oxygen: Secondary | ICD-10-CM | POA: Diagnosis not present

## 2023-05-10 DIAGNOSIS — Z7982 Long term (current) use of aspirin: Secondary | ICD-10-CM | POA: Diagnosis not present

## 2023-05-10 DIAGNOSIS — J9601 Acute respiratory failure with hypoxia: Secondary | ICD-10-CM | POA: Diagnosis not present

## 2023-05-10 DIAGNOSIS — G9340 Encephalopathy, unspecified: Secondary | ICD-10-CM | POA: Diagnosis not present

## 2023-05-10 DIAGNOSIS — I2510 Atherosclerotic heart disease of native coronary artery without angina pectoris: Secondary | ICD-10-CM | POA: Diagnosis not present

## 2023-05-10 DIAGNOSIS — H3530 Unspecified macular degeneration: Secondary | ICD-10-CM | POA: Diagnosis not present

## 2023-05-10 DIAGNOSIS — I10 Essential (primary) hypertension: Secondary | ICD-10-CM | POA: Diagnosis not present

## 2023-05-10 DIAGNOSIS — Z79899 Other long term (current) drug therapy: Secondary | ICD-10-CM | POA: Diagnosis not present

## 2023-05-10 DIAGNOSIS — I739 Peripheral vascular disease, unspecified: Secondary | ICD-10-CM | POA: Diagnosis not present

## 2023-05-10 DIAGNOSIS — D509 Iron deficiency anemia, unspecified: Secondary | ICD-10-CM | POA: Diagnosis not present

## 2023-05-10 DIAGNOSIS — Z79891 Long term (current) use of opiate analgesic: Secondary | ICD-10-CM | POA: Diagnosis not present

## 2023-05-10 DIAGNOSIS — K219 Gastro-esophageal reflux disease without esophagitis: Secondary | ICD-10-CM | POA: Diagnosis not present

## 2023-05-10 DIAGNOSIS — I350 Nonrheumatic aortic (valve) stenosis: Secondary | ICD-10-CM | POA: Diagnosis not present

## 2023-05-10 DIAGNOSIS — E441 Mild protein-calorie malnutrition: Secondary | ICD-10-CM | POA: Diagnosis not present

## 2023-05-12 DIAGNOSIS — J449 Chronic obstructive pulmonary disease, unspecified: Secondary | ICD-10-CM | POA: Diagnosis not present

## 2023-05-12 DIAGNOSIS — Z515 Encounter for palliative care: Secondary | ICD-10-CM | POA: Diagnosis not present

## 2023-05-15 ENCOUNTER — Telehealth: Payer: Self-pay | Admitting: *Deleted

## 2023-05-15 NOTE — Progress Notes (Signed)
 Complex Care Management Care Guide Note  05/15/2023 Name: Beverly [name] MRN: [id number] DOB: [birth year]  Beverly [name] is a 89 y.o. year old female who is a primary care patient of [place] Favia, Lethia Raveling, MD and is actively engaged with the care management team. I reached out to Beverly [name] by phone today to assist with re-scheduling  with the RN Case Manager.  Follow up plan: pt and daughter declined to reschedule at this time - has PT/HH coming. Contact info given if services needed in the future  Beverly [name], CMA [place]  [facility], [facility] Guide Direct Dial: [phone]  Fax: [phone] Website: [email or web address]

## 2023-05-16 ENCOUNTER — Encounter: Payer: Self-pay | Admitting: *Deleted

## 2023-05-16 NOTE — Patient Outreach (Signed)
 Updated by [facility] CMA that patient decline further outreach when reached to attempt rescheduling missed appointment Case closure completed  Tyrick Dunagan L. Mcarthur Speedy, RN, BSN, CCM [place]  [facility], [facility] RN Care Manager Direct Dial: [phone]  Fax: [phone]

## 2023-05-17 DIAGNOSIS — I2510 Atherosclerotic heart disease of native coronary artery without angina pectoris: Secondary | ICD-10-CM | POA: Diagnosis not present

## 2023-05-17 DIAGNOSIS — G9340 Encephalopathy, unspecified: Secondary | ICD-10-CM | POA: Diagnosis not present

## 2023-05-17 DIAGNOSIS — I700 Atherosclerosis of aorta: Secondary | ICD-10-CM | POA: Diagnosis not present

## 2023-05-17 DIAGNOSIS — Z7982 Long term (current) use of aspirin: Secondary | ICD-10-CM | POA: Diagnosis not present

## 2023-05-17 DIAGNOSIS — J9601 Acute respiratory failure with hypoxia: Secondary | ICD-10-CM | POA: Diagnosis not present

## 2023-05-17 DIAGNOSIS — K219 Gastro-esophageal reflux disease without esophagitis: Secondary | ICD-10-CM | POA: Diagnosis not present

## 2023-05-17 DIAGNOSIS — Z9981 Dependence on supplemental oxygen: Secondary | ICD-10-CM | POA: Diagnosis not present

## 2023-05-17 DIAGNOSIS — G4710 Hypersomnia, unspecified: Secondary | ICD-10-CM | POA: Diagnosis not present

## 2023-05-17 DIAGNOSIS — E441 Mild protein-calorie malnutrition: Secondary | ICD-10-CM | POA: Diagnosis not present

## 2023-05-17 DIAGNOSIS — I10 Essential (primary) hypertension: Secondary | ICD-10-CM | POA: Diagnosis not present

## 2023-05-17 DIAGNOSIS — J440 Chronic obstructive pulmonary disease with acute lower respiratory infection: Secondary | ICD-10-CM | POA: Diagnosis not present

## 2023-05-17 DIAGNOSIS — D509 Iron deficiency anemia, unspecified: Secondary | ICD-10-CM | POA: Diagnosis not present

## 2023-05-17 DIAGNOSIS — Z79891 Long term (current) use of opiate analgesic: Secondary | ICD-10-CM | POA: Diagnosis not present

## 2023-05-17 DIAGNOSIS — Z791 Long term (current) use of non-steroidal anti-inflammatories (NSAID): Secondary | ICD-10-CM | POA: Diagnosis not present

## 2023-05-17 DIAGNOSIS — H3530 Unspecified macular degeneration: Secondary | ICD-10-CM | POA: Diagnosis not present

## 2023-05-17 DIAGNOSIS — Z79899 Other long term (current) drug therapy: Secondary | ICD-10-CM | POA: Diagnosis not present

## 2023-05-17 DIAGNOSIS — I739 Peripheral vascular disease, unspecified: Secondary | ICD-10-CM | POA: Diagnosis not present

## 2023-05-17 DIAGNOSIS — I350 Nonrheumatic aortic (valve) stenosis: Secondary | ICD-10-CM | POA: Diagnosis not present

## 2023-05-18 DIAGNOSIS — E782 Mixed hyperlipidemia: Secondary | ICD-10-CM | POA: Diagnosis not present

## 2023-05-18 DIAGNOSIS — K582 Mixed irritable bowel syndrome: Secondary | ICD-10-CM | POA: Diagnosis not present

## 2023-05-18 DIAGNOSIS — Z7189 Other specified counseling: Secondary | ICD-10-CM | POA: Diagnosis not present

## 2023-05-18 DIAGNOSIS — M542 Cervicalgia: Secondary | ICD-10-CM | POA: Diagnosis not present

## 2023-05-18 DIAGNOSIS — M5481 Occipital neuralgia: Secondary | ICD-10-CM | POA: Diagnosis not present

## 2023-05-18 DIAGNOSIS — I10 Essential (primary) hypertension: Secondary | ICD-10-CM | POA: Diagnosis not present

## 2023-05-18 DIAGNOSIS — I2510 Atherosclerotic heart disease of native coronary artery without angina pectoris: Secondary | ICD-10-CM | POA: Diagnosis not present

## 2023-05-18 DIAGNOSIS — N390 Urinary tract infection, site not specified: Secondary | ICD-10-CM | POA: Diagnosis not present

## 2023-05-18 DIAGNOSIS — K219 Gastro-esophageal reflux disease without esophagitis: Secondary | ICD-10-CM | POA: Diagnosis not present

## 2023-05-23 DIAGNOSIS — I2510 Atherosclerotic heart disease of native coronary artery without angina pectoris: Secondary | ICD-10-CM | POA: Diagnosis not present

## 2023-05-23 DIAGNOSIS — G9340 Encephalopathy, unspecified: Secondary | ICD-10-CM | POA: Diagnosis not present

## 2023-05-23 DIAGNOSIS — G4710 Hypersomnia, unspecified: Secondary | ICD-10-CM | POA: Diagnosis not present

## 2023-05-23 DIAGNOSIS — E441 Mild protein-calorie malnutrition: Secondary | ICD-10-CM | POA: Diagnosis not present

## 2023-05-23 DIAGNOSIS — I739 Peripheral vascular disease, unspecified: Secondary | ICD-10-CM | POA: Diagnosis not present

## 2023-05-23 DIAGNOSIS — H3530 Unspecified macular degeneration: Secondary | ICD-10-CM | POA: Diagnosis not present

## 2023-05-23 DIAGNOSIS — Z7982 Long term (current) use of aspirin: Secondary | ICD-10-CM | POA: Diagnosis not present

## 2023-05-23 DIAGNOSIS — D509 Iron deficiency anemia, unspecified: Secondary | ICD-10-CM | POA: Diagnosis not present

## 2023-05-23 DIAGNOSIS — I700 Atherosclerosis of aorta: Secondary | ICD-10-CM | POA: Diagnosis not present

## 2023-05-23 DIAGNOSIS — I350 Nonrheumatic aortic (valve) stenosis: Secondary | ICD-10-CM | POA: Diagnosis not present

## 2023-05-23 DIAGNOSIS — J440 Chronic obstructive pulmonary disease with acute lower respiratory infection: Secondary | ICD-10-CM | POA: Diagnosis not present

## 2023-05-23 DIAGNOSIS — K219 Gastro-esophageal reflux disease without esophagitis: Secondary | ICD-10-CM | POA: Diagnosis not present

## 2023-05-23 DIAGNOSIS — I10 Essential (primary) hypertension: Secondary | ICD-10-CM | POA: Diagnosis not present

## 2023-05-23 DIAGNOSIS — Z791 Long term (current) use of non-steroidal anti-inflammatories (NSAID): Secondary | ICD-10-CM | POA: Diagnosis not present

## 2023-05-23 DIAGNOSIS — Z79899 Other long term (current) drug therapy: Secondary | ICD-10-CM | POA: Diagnosis not present

## 2023-05-23 DIAGNOSIS — Z9981 Dependence on supplemental oxygen: Secondary | ICD-10-CM | POA: Diagnosis not present

## 2023-05-23 DIAGNOSIS — J9601 Acute respiratory failure with hypoxia: Secondary | ICD-10-CM | POA: Diagnosis not present

## 2023-05-23 DIAGNOSIS — Z79891 Long term (current) use of opiate analgesic: Secondary | ICD-10-CM | POA: Diagnosis not present

## 2023-05-24 DIAGNOSIS — G4710 Hypersomnia, unspecified: Secondary | ICD-10-CM | POA: Diagnosis not present

## 2023-05-24 DIAGNOSIS — H3530 Unspecified macular degeneration: Secondary | ICD-10-CM | POA: Diagnosis not present

## 2023-05-24 DIAGNOSIS — D509 Iron deficiency anemia, unspecified: Secondary | ICD-10-CM | POA: Diagnosis not present

## 2023-05-24 DIAGNOSIS — G9340 Encephalopathy, unspecified: Secondary | ICD-10-CM | POA: Diagnosis not present

## 2023-05-24 DIAGNOSIS — I2510 Atherosclerotic heart disease of native coronary artery without angina pectoris: Secondary | ICD-10-CM | POA: Diagnosis not present

## 2023-05-24 DIAGNOSIS — Z79899 Other long term (current) drug therapy: Secondary | ICD-10-CM | POA: Diagnosis not present

## 2023-05-24 DIAGNOSIS — Z7982 Long term (current) use of aspirin: Secondary | ICD-10-CM | POA: Diagnosis not present

## 2023-05-24 DIAGNOSIS — J9601 Acute respiratory failure with hypoxia: Secondary | ICD-10-CM | POA: Diagnosis not present

## 2023-05-24 DIAGNOSIS — Z79891 Long term (current) use of opiate analgesic: Secondary | ICD-10-CM | POA: Diagnosis not present

## 2023-05-24 DIAGNOSIS — I700 Atherosclerosis of aorta: Secondary | ICD-10-CM | POA: Diagnosis not present

## 2023-05-24 DIAGNOSIS — E441 Mild protein-calorie malnutrition: Secondary | ICD-10-CM | POA: Diagnosis not present

## 2023-05-24 DIAGNOSIS — J440 Chronic obstructive pulmonary disease with acute lower respiratory infection: Secondary | ICD-10-CM | POA: Diagnosis not present

## 2023-05-24 DIAGNOSIS — Z791 Long term (current) use of non-steroidal anti-inflammatories (NSAID): Secondary | ICD-10-CM | POA: Diagnosis not present

## 2023-05-24 DIAGNOSIS — I739 Peripheral vascular disease, unspecified: Secondary | ICD-10-CM | POA: Diagnosis not present

## 2023-05-24 DIAGNOSIS — Z9981 Dependence on supplemental oxygen: Secondary | ICD-10-CM | POA: Diagnosis not present

## 2023-05-24 DIAGNOSIS — I10 Essential (primary) hypertension: Secondary | ICD-10-CM | POA: Diagnosis not present

## 2023-05-24 DIAGNOSIS — I350 Nonrheumatic aortic (valve) stenosis: Secondary | ICD-10-CM | POA: Diagnosis not present

## 2023-05-24 DIAGNOSIS — K219 Gastro-esophageal reflux disease without esophagitis: Secondary | ICD-10-CM | POA: Diagnosis not present

## 2023-05-26 DIAGNOSIS — Z7409 Other reduced mobility: Secondary | ICD-10-CM | POA: Diagnosis not present

## 2023-05-26 DIAGNOSIS — M542 Cervicalgia: Secondary | ICD-10-CM | POA: Diagnosis not present

## 2023-05-26 DIAGNOSIS — Z789 Other specified health status: Secondary | ICD-10-CM | POA: Diagnosis not present

## 2023-05-26 DIAGNOSIS — I10 Essential (primary) hypertension: Secondary | ICD-10-CM | POA: Diagnosis not present

## 2023-05-26 DIAGNOSIS — N390 Urinary tract infection, site not specified: Secondary | ICD-10-CM | POA: Diagnosis not present

## 2023-06-05 DIAGNOSIS — I10 Essential (primary) hypertension: Secondary | ICD-10-CM | POA: Diagnosis not present

## 2023-06-08 DIAGNOSIS — Z022 Encounter for examination for admission to residential institution: Secondary | ICD-10-CM | POA: Diagnosis not present

## 2023-06-08 DIAGNOSIS — G894 Chronic pain syndrome: Secondary | ICD-10-CM | POA: Diagnosis not present

## 2023-06-08 DIAGNOSIS — I10 Essential (primary) hypertension: Secondary | ICD-10-CM | POA: Diagnosis not present

## 2023-06-08 DIAGNOSIS — N390 Urinary tract infection, site not specified: Secondary | ICD-10-CM | POA: Diagnosis not present

## 2023-06-08 DIAGNOSIS — M5481 Occipital neuralgia: Secondary | ICD-10-CM | POA: Diagnosis not present

## 2023-06-08 DIAGNOSIS — Z79899 Other long term (current) drug therapy: Secondary | ICD-10-CM | POA: Diagnosis not present

## 2023-06-09 DIAGNOSIS — N390 Urinary tract infection, site not specified: Secondary | ICD-10-CM | POA: Diagnosis not present

## 2023-06-09 DIAGNOSIS — Z7409 Other reduced mobility: Secondary | ICD-10-CM | POA: Diagnosis not present

## 2023-06-09 DIAGNOSIS — M542 Cervicalgia: Secondary | ICD-10-CM | POA: Diagnosis not present

## 2023-06-09 DIAGNOSIS — I10 Essential (primary) hypertension: Secondary | ICD-10-CM | POA: Diagnosis not present

## 2023-06-09 DIAGNOSIS — Z789 Other specified health status: Secondary | ICD-10-CM | POA: Diagnosis not present

## 2023-06-13 ENCOUNTER — Other Ambulatory Visit: Payer: Self-pay | Admitting: Adult Health

## 2023-06-23 DIAGNOSIS — Z515 Encounter for palliative care: Secondary | ICD-10-CM | POA: Diagnosis not present

## 2023-06-23 DIAGNOSIS — J449 Chronic obstructive pulmonary disease, unspecified: Secondary | ICD-10-CM | POA: Diagnosis not present

## 2023-07-04 DIAGNOSIS — I10 Essential (primary) hypertension: Secondary | ICD-10-CM | POA: Diagnosis not present

## 2023-07-04 DIAGNOSIS — E782 Mixed hyperlipidemia: Secondary | ICD-10-CM | POA: Diagnosis not present

## 2023-07-14 DIAGNOSIS — I2510 Atherosclerotic heart disease of native coronary artery without angina pectoris: Secondary | ICD-10-CM | POA: Diagnosis not present

## 2023-07-14 DIAGNOSIS — M5481 Occipital neuralgia: Secondary | ICD-10-CM | POA: Diagnosis not present

## 2023-07-14 DIAGNOSIS — G894 Chronic pain syndrome: Secondary | ICD-10-CM | POA: Diagnosis not present

## 2023-07-14 DIAGNOSIS — E119 Type 2 diabetes mellitus without complications: Secondary | ICD-10-CM | POA: Diagnosis not present

## 2023-07-14 DIAGNOSIS — N390 Urinary tract infection, site not specified: Secondary | ICD-10-CM | POA: Diagnosis not present

## 2023-07-14 DIAGNOSIS — I10 Essential (primary) hypertension: Secondary | ICD-10-CM | POA: Diagnosis not present

## 2023-07-14 DIAGNOSIS — M542 Cervicalgia: Secondary | ICD-10-CM | POA: Diagnosis not present

## 2023-07-14 DIAGNOSIS — D509 Iron deficiency anemia, unspecified: Secondary | ICD-10-CM | POA: Diagnosis not present

## 2023-07-14 DIAGNOSIS — M6281 Muscle weakness (generalized): Secondary | ICD-10-CM | POA: Diagnosis not present

## 2023-07-14 DIAGNOSIS — Z0000 Encounter for general adult medical examination without abnormal findings: Secondary | ICD-10-CM | POA: Diagnosis not present

## 2023-08-04 DIAGNOSIS — J449 Chronic obstructive pulmonary disease, unspecified: Secondary | ICD-10-CM | POA: Diagnosis not present

## 2023-08-04 DIAGNOSIS — I10 Essential (primary) hypertension: Secondary | ICD-10-CM | POA: Diagnosis not present

## 2023-08-04 DIAGNOSIS — E782 Mixed hyperlipidemia: Secondary | ICD-10-CM | POA: Diagnosis not present

## 2023-08-08 DIAGNOSIS — M542 Cervicalgia: Secondary | ICD-10-CM | POA: Diagnosis not present

## 2023-08-08 DIAGNOSIS — E782 Mixed hyperlipidemia: Secondary | ICD-10-CM | POA: Diagnosis not present

## 2023-08-08 DIAGNOSIS — I10 Essential (primary) hypertension: Secondary | ICD-10-CM | POA: Diagnosis not present

## 2023-08-08 DIAGNOSIS — J449 Chronic obstructive pulmonary disease, unspecified: Secondary | ICD-10-CM | POA: Diagnosis not present

## 2023-08-08 DIAGNOSIS — M5481 Occipital neuralgia: Secondary | ICD-10-CM | POA: Diagnosis not present

## 2023-08-08 DIAGNOSIS — N390 Urinary tract infection, site not specified: Secondary | ICD-10-CM | POA: Diagnosis not present

## 2023-08-08 DIAGNOSIS — E871 Hypo-osmolality and hyponatremia: Secondary | ICD-10-CM | POA: Diagnosis not present

## 2023-08-08 DIAGNOSIS — K219 Gastro-esophageal reflux disease without esophagitis: Secondary | ICD-10-CM | POA: Diagnosis not present

## 2023-08-08 DIAGNOSIS — D509 Iron deficiency anemia, unspecified: Secondary | ICD-10-CM | POA: Diagnosis not present

## 2023-09-02 ENCOUNTER — Other Ambulatory Visit: Payer: Self-pay | Admitting: Adult Health

## 2023-09-02 DIAGNOSIS — F419 Anxiety disorder, unspecified: Secondary | ICD-10-CM

## 2023-09-02 DIAGNOSIS — G8929 Other chronic pain: Secondary | ICD-10-CM

## 2023-09-02 MED ORDER — ALPRAZOLAM 0.5 MG PO TABS: 0.5000 mg | ORAL_TABLET | Freq: Two times a day (BID) | ORAL | 0 refills | Status: DC | PRN

## 2023-09-02 MED ORDER — HYDROCODONE-ACETAMINOPHEN 10-325 MG PO TABS: 1.0000 | ORAL_TABLET | Freq: Two times a day (BID) | ORAL | 0 refills | Status: DC

## 2023-09-02 NOTE — Progress Notes (Signed)
--  Charge nurse called and requested prescription for Norco 10-325 mg BID and Alprazolam  0.5 mg BID PRN.

## 2023-09-05 ENCOUNTER — Other Ambulatory Visit (HOSPITAL_COMMUNITY)
Admission: RE | Admit: 2023-09-05 | Discharge: 2023-09-05 | Disposition: A | Discharge status: Home / Self Care | Source: Skilled Nursing Facility | Attending: Adult Health | Admitting: Adult Health

## 2023-09-05 ENCOUNTER — Encounter: Payer: Self-pay | Admitting: Adult Health

## 2023-09-05 ENCOUNTER — Non-Acute Institutional Stay (SKILLED_NURSING_FACILITY): Admitting: Adult Health

## 2023-09-05 DIAGNOSIS — K219 Gastro-esophageal reflux disease without esophagitis: Secondary | ICD-10-CM | POA: Diagnosis not present

## 2023-09-05 DIAGNOSIS — I10 Essential (primary) hypertension: Secondary | ICD-10-CM | POA: Insufficient documentation

## 2023-09-05 DIAGNOSIS — E441 Mild protein-calorie malnutrition: Secondary | ICD-10-CM

## 2023-09-05 DIAGNOSIS — I25119 Atherosclerotic heart disease of native coronary artery with unspecified angina pectoris: Secondary | ICD-10-CM

## 2023-09-05 DIAGNOSIS — M1990 Unspecified osteoarthritis, unspecified site: Secondary | ICD-10-CM | POA: Diagnosis not present

## 2023-09-05 DIAGNOSIS — H401131 Primary open-angle glaucoma, bilateral, mild stage: Secondary | ICD-10-CM

## 2023-09-05 DIAGNOSIS — J42 Unspecified chronic bronchitis: Secondary | ICD-10-CM | POA: Diagnosis not present

## 2023-09-05 DIAGNOSIS — N390 Urinary tract infection, site not specified: Secondary | ICD-10-CM | POA: Diagnosis not present

## 2023-09-05 DIAGNOSIS — I700 Atherosclerosis of aorta: Secondary | ICD-10-CM

## 2023-09-05 DIAGNOSIS — G40109 Localization-related (focal) (partial) symptomatic epilepsy and epileptic syndromes with simple partial seizures, not intractable, without status epilepticus: Secondary | ICD-10-CM

## 2023-09-05 DIAGNOSIS — G3184 Mild cognitive impairment, so stated: Secondary | ICD-10-CM

## 2023-09-05 DIAGNOSIS — F339 Major depressive disorder, recurrent, unspecified: Secondary | ICD-10-CM

## 2023-09-05 LAB — COMPREHENSIVE METABOLIC PANEL WITH GFR
ALT: 10 U/L (ref 0–44)
AST: 16 U/L (ref 15–41)
Albumin: 3.3 g/dL — ABNORMAL LOW (ref 3.5–5.0)
Alkaline Phosphatase: 71 U/L (ref 38–126)
Anion gap: 11 (ref 5–15)
BUN: 15 mg/dL (ref 8–23)
CO2: 31 mmol/L (ref 22–32)
Calcium: 9.1 mg/dL (ref 8.9–10.3)
Chloride: 93 mmol/L — ABNORMAL LOW (ref 98–111)
Creatinine, Ser: 0.57 mg/dL (ref 0.44–1.00)
GFR, Estimated: >60 mL/min (ref >60)
Glucose, Bld: 100 mg/dL — ABNORMAL HIGH (ref 70–99)
Potassium: 3.8 mmol/L (ref 3.5–5.1)
Sodium: 135 mmol/L (ref 135–145)
Total Bilirubin: 0.5 mg/dL (ref 0.0–1.2)
Total Protein: 6.1 g/dL — ABNORMAL LOW (ref 6.5–8.1)

## 2023-09-05 LAB — CBC
HCT: 32.5 % — ABNORMAL LOW (ref 36.0–46.0)
Hemoglobin: 10.6 g/dL — ABNORMAL LOW (ref 12.0–15.0)
MCH: 31 pg (ref 26.0–34.0)
MCHC: 32.6 g/dL (ref 30.0–36.0)
MCV: 95 fL (ref 80.0–100.0)
Platelets: 177 K/uL (ref 150–400)
RBC: 3.42 MIL/uL — ABNORMAL LOW (ref 3.87–5.11)
RDW: 15.1 % (ref 11.5–15.5)
WBC: 7.1 K/uL (ref 4.0–10.5)
nRBC: 0 % (ref 0.0–0.2)

## 2023-09-05 NOTE — Progress Notes (Signed)
 Location:  [facility] Room Number: 118 Place of Service:  SNF (31)   CODE STATUS: DNR   Allergies  Allergen Reactions   Lisinopril  Swelling   Egg-Derived Products Itching    Patient reports that she can eat eggs but does not tolerate egg derived products   Morphine  And Codeine     Headache   Penicillins Itching         Chief Complaint  Patient presents with   Acute Visit    Follow up transfer     HPI:  She is a 89 year old with a history of anxiety; arthritis; COPD. She is being transferred to this facility from home. She states that she is doing well today. She denies any cough; no shortness of breath. She denies any back or joint pain. She will continue to be followed for her chronic illnesses including: Aortic atherosclerosis  Primary hypertension: Chronic bronchitis unspecified chronic bronchitis unspecified  Past Medical History:  Diagnosis Date   Anxiety    Arthritis    COPD (chronic obstructive pulmonary disease) (HCC)    Coronary artery disease    a. STEMI with cardiac arrest (polymorphic VT) s/p DES to RCA.   GERD (gastroesophageal reflux disease)    Headache(784.0)    Hearing loss, central    Left ear   Hematoma    Hypertension    Memory changes    Mild aortic stenosis    Mild carotid artery disease (HCC)    a. 1-39% by duplex 02/2018.   Seizures (HCC) 10/2012   No seizures, speech issues; after a fall, hitting head on left side   UTI (urinary tract infection)    June 2022    Past Surgical History:  Procedure Laterality Date   ABDOMINAL HYSTERECTOMY     APPENDECTOMY     CARDIAC CATHETERIZATION     15 yrs ago   CORONARY/GRAFT ACUTE MI REVASCULARIZATION N/A 01/10/2018   Procedure: Coronary/Graft Acute MI Revascularization;  Surgeon: Court Dorn PARAS, MD;  Location: [facility] INVASIVE CV LAB;  Service: Cardiovascular;  Laterality: N/A;   CRANIOTOMY Left 10/30/2012   Procedure: CRANIOTOMY HEMATOMA EVACUATION SUBDURAL;  Surgeon: Fairy Levels, MD;  Location: [facility] NEURO ORS;  Service: Neurosurgery;  Laterality: Left;  Left Craniotomy for evacuation of subdural hematoma   JOINT REPLACEMENT Bilateral    knees   LEFT HEART CATH AND CORONARY ANGIOGRAPHY N/A 01/10/2018   Procedure: LEFT HEART CATH AND CORONARY ANGIOGRAPHY;  Surgeon: Court Dorn PARAS, MD;  Location: [facility] INVASIVE CV LAB;  Service: Cardiovascular;  Laterality: N/A;    Social History   Socioeconomic History   Marital status: Married    Spouse name: Not on file   Number of children: Not on file   Years of education: Not on file   Highest education level: Not on file  Occupational History   Not on file  Tobacco Use   Smoking status: Never   Smokeless tobacco: Never  Vaping Use   Vaping status: Never Used  Substance and Sexual Activity   Alcohol  use: No   Drug use: No   Sexual activity: Not on file  Other Topics Concern   Not on file  Social History Narrative   Not on file   Social Drivers of Health   Financial Resource Strain: Low Risk  (04/24/2023)   Overall Financial Resource Strain (CARDIA)    Difficulty of Paying Living Expenses: Not hard at all  Food Insecurity: No Food Insecurity (04/24/2023)   Hunger  Vital Sign    Worried About Programme researcher, broadcasting/film/video in the Last Year: Never true    Ran Out of Food in the Last Year: Never true  Transportation Needs: No Transportation Needs (04/24/2023)   PRAPARE - Administrator, Civil Service (Medical): No    Lack of Transportation (Non-Medical): No  Physical Activity: Not on file  Stress: No Stress Concern Present (04/24/2023)   [employer] of Occupational Health - Occupational Stress Questionnaire    Feeling of Stress : Only a little  Social Connections: Socially Integrated (04/24/2023)   Social Connection and Isolation Panel    Frequency of Communication with Friends and Family: Three times a week    Frequency of Social Gatherings with Friends and Family: Three times a week    Attends Religious  Services: More than 4 times per year    Active Member of Clubs or Organizations: Yes    Attends Banker Meetings: More than 4 times per year    Marital Status: Married  Catering manager Violence: Not At Risk (04/24/2023)   Humiliation, Afraid, Rape, and Kick questionnaire    Fear of Current or Ex-Partner: No    Emotionally Abused: No    Physically Abused: No    Sexually Abused: No   Family History  Problem Relation Age of Onset   Dementia Mother    Cancer Father    Dementia Maternal Aunt    Ataxia Neg Hx    Chorea Neg Hx    Mental retardation Neg Hx    Migraines Neg Hx    Multiple sclerosis Neg Hx    Neurofibromatosis Neg Hx    Neuropathy Neg Hx    Parkinsonism Neg Hx    Seizures Neg Hx    Stroke Neg Hx       VITAL SIGNS BP (!) 131/58   Pulse 70   Temp (!) 97.5 F (36.4 C)   Resp 16   Ht 5' (1.524 m)   Wt 146 lb 6.4 oz (66.4 kg)   SpO2 96%   BMI 28.59 kg/m   Outpatient Encounter Medications as of 09/05/2023  Medication Sig   albuterol  (PROVENTIL ) (5 MG/ML) 0.5% nebulizer solution Take 0.5 mLs (2.5 mg total) by nebulization every 6 (six) hours as needed for wheezing or shortness of breath.   ALPRAZolam  (XANAX ) 0.5 MG tablet Take 1 tablet (0.5 mg total) by mouth 2 (two) times daily as needed for anxiety or sleep.   amLODipine  (NORVASC ) 5 MG tablet Take 5 mg by mouth daily.   aspirin  81 MG chewable tablet Chew 81 mg by mouth daily.   carvedilol  (COREG ) 12.5 MG tablet Take 1 tablet (12.5 mg total) by mouth 2 (two) times daily.   celecoxib  (CELEBREX ) 100 MG capsule Take 100 mg by mouth 2 (two) times daily.   cephALEXin  (KEFLEX ) 250 MG capsule Take 250 mg by mouth once. oral, Once A Day, For UTI prophylaxis.   Cholecalciferol  25 MCG (1000 UT) TBDP Take 1,000 Units by mouth daily.   cyanocobalamin 1000 MCG tablet Take 1,000 mcg by mouth daily.   D-Mannose (AZO D-MANNOSE) 500 MG CAPS Take 500 mg by mouth in the morning and at bedtime.   fluticasone  (FLONASE ) 50  MCG/ACT nasal spray Place 2 sprays into both nostrils daily.   gabapentin  (NEURONTIN ) 100 MG capsule Take 100 mg by mouth 3 (three) times daily. oral, Three Times A Day - PRN, As needed for neuropathic pain.   HYDROcodone -acetaminophen  (NORCO) 10-325 MG tablet  Take 1 tablet by mouth 2 (two) times daily.   latanoprost  (XALATAN ) 0.005 % ophthalmic solution INSTILL ONE DROP IN BOTH EYES NIGHTLY   levalbuterol (XOPENEX) 1.25 MG/3ML nebulizer solution SMARTSIG:1 Vial(s) Via Nebulizer Every 8 Hours PRN   levETIRAcetam  (KEPPRA ) 250 MG tablet Take 250 mg by mouth 2 (two) times daily.   memantine  (NAMENDA ) 5 MG tablet Take 1 tablet (5 mg total) by mouth 2 (two) times daily.   methocarbamol  (ROBAXIN ) 500 MG tablet Take 500 mg by mouth every 6 (six) hours as needed.   nitroGLYCERIN  (NITROSTAT ) 0.4 MG SL tablet Place 1 tablet (0.4 mg total) under the tongue every 5 (five) minutes x 3 doses as needed for chest pain.   Nutritional Supplements (ENSURE ENLIVE PO) Take 237 mLs by mouth daily.   omeprazole  (PRILOSEC) 40 MG capsule Take 1 capsule (40 mg total) by mouth daily.   Probiotic Product (RISA-BID PROBIOTIC) TABS Take 250 tablets by mouth once.   risperiDONE  (RISPERDAL ) 0.5 MG tablet Take 1 tablet (0.5 mg total) by mouth 2 (two) times daily.   sertraline  (ZOLOFT ) 50 MG tablet Take 1 tablet (50 mg total) by mouth daily.   No facility-administered encounter medications on file as of 09/05/2023.     SIGNIFICANT DIAGNOSTIC EXAMS   Review of Systems  Constitutional:  Negative for malaise/fatigue.  Respiratory:  Negative for cough and shortness of breath.   Cardiovascular:  Negative for chest pain, palpitations and leg swelling.  Gastrointestinal:  Negative for abdominal pain, constipation and heartburn.  Musculoskeletal:  Negative for back pain, joint pain and myalgias.  Skin: Negative.   Neurological:  Negative for dizziness.  Psychiatric/Behavioral:  The patient is not nervous/anxious.    Physical  Exam Constitutional:      General: She is not in acute distress.    Appearance: She is well-developed. She is not diaphoretic.  Neck:     Thyroid : No thyromegaly.  Cardiovascular:     Rate and Rhythm: Normal rate and regular rhythm.     Pulses: Normal pulses.     Heart sounds: Murmur heard.  Pulmonary:     Effort: Pulmonary effort is normal. No respiratory distress.     Breath sounds: Normal breath sounds.  Abdominal:     General: Bowel sounds are normal. There is no distension.     Palpations: Abdomen is soft.     Tenderness: There is no abdominal tenderness.  Musculoskeletal:        General: Normal range of motion.     Cervical back: Neck supple.     Right lower leg: No edema.     Left lower leg: No edema.     Comments: Kyphosis   Lymphadenopathy:     Cervical: No cervical adenopathy.  Skin:    General: Skin is warm and dry.  Neurological:     Mental Status: She is alert. Mental status is at baseline.  Psychiatric:        Mood and Affect: Mood normal.     ASSESSMENT/ PLAN:  TODAY  Aortic atherosclerosis (ct 05-08-20) is on asa; not on statin due to advanced age  52.  Primary hypertension: b/p 131/58: will continue asa 81 mg daily; norvasc  5 mg daily coreg  12.5 mg twice daily   3. Chronic bronchitis unspecified chronic bronchitis unspecified: will continue flonase  daily; xopenex 1.25 mg every 8 hours as needed   4. GERD without esophagitis: will continue prilosec 40 mg daily  5. 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 Arthritis: will continue celebrex  100 mg twice daily and vicodin 10/325 mg twice daily; gabapentin  100 mg three times daily; robaxin  500 mg every 6 hours as needed    7. Primary open angle glaucoma both eyes stage mild: will continue xalatan  to both eyes nightly  8. Mild protein calorie malnutrition: will continue  ensure daily   9. MCI (Mild cognitive impairment): will continue namenda  5 mg twice daily   10. Major depression recurrent chronic: will continue Risperdal  0.5 mg twice daily and zoloft  50 mg daily; xanax  0.5 mg twice daily as needed   11.  Chronic UTI: will continue keflex  250 mg daily and d-mannose twice daily   12. Coronary artery disease involving native coronary artery of native with angina pectoris: has ntg prn.   Will check cbc; cmp   Barnie Seip NP [facility]   call [phone]

## 2023-09-06 ENCOUNTER — Encounter: Payer: Self-pay | Admitting: Internal Medicine

## 2023-09-06 ENCOUNTER — Non-Acute Institutional Stay (SKILLED_NURSING_FACILITY): Admitting: Internal Medicine

## 2023-09-06 DIAGNOSIS — E441 Mild protein-calorie malnutrition: Secondary | ICD-10-CM | POA: Diagnosis not present

## 2023-09-06 DIAGNOSIS — I10 Essential (primary) hypertension: Secondary | ICD-10-CM

## 2023-09-06 DIAGNOSIS — D638 Anemia in other chronic diseases classified elsewhere: Secondary | ICD-10-CM | POA: Diagnosis not present

## 2023-09-06 DIAGNOSIS — J42 Unspecified chronic bronchitis: Secondary | ICD-10-CM | POA: Diagnosis not present

## 2023-09-06 NOTE — Assessment & Plan Note (Addendum)
 Albumin is 3.3 and total protein 6.1; total protein had been normal at 7.1 on 03/01/2023.  Nutritionist will evaluate here at the SNF.

## 2023-09-06 NOTE — Patient Instructions (Signed)
 See assessment and plan under each diagnosis in the problem list and acutely for this visit

## 2023-09-06 NOTE — Assessment & Plan Note (Signed)
 BP controlled; no change in antihypertensive medications When I saw her 09/06/2023 she was eating a bag of Cheetos.  I explained the role of sodium in exacerbating hypertension.

## 2023-09-06 NOTE — Assessment & Plan Note (Signed)
 O2 sats are excellent on present O2 settings and pulmonary regimen.  Continue to monitor.

## 2023-09-06 NOTE — Progress Notes (Signed)
 NURSING HOME LOCATION:  [facility] ROOM NUMBER:  [id number]  CODE STATUS:  DNR  PCP:  Norleen PEDLAR. Shona MD  This is a comprehensive admission note to this SNFperformed on this date less than 30 days from date of admission. Included are preadmission medical/surgical history; reconciled medication list; family history; social history and comprehensive review of systems.  Corrections and additions to the records were documented. Comprehensive physical exam was also performed. Additionally a clinical summary was entered for each active diagnosis pertinent to this admission in the Problem List to enhance continuity of care.  HPI: She was admitted from home 09/01/2023; she had been participating in the [facility] protocol,with PT/HH assessments in her in the home; but she declined further involvement in that program.   She stated that the home health nurse was coming to the home approximately once a month.  She is entering the facility as her daughter is to have shoulder surgery and will be unable to provide her direct care. Upon admission to this facility labs were updated.  Minimal hyperglycemia was present with a glucose of 100.  The most recent A1c in Epic was 6.1% on 10/19/2012.  Apparently A1c was also performed 07/14/2023 by her PCP.  Protein/caloric malnutrition is suggested by an albumin of 3.3 and total protein of 6.1.  The total protein had been normal at 7.1 on 03/01/2023.  Normochromic, normocytic anemia is present with H/H of 10.6/32.5; these values have dropped from prior values of 12.8/39.9.  Her PCP, Dr. Shona has evaluated the iron deficiency anemia;with iron and iron binding capacity on 7/11. Unfortunately those records are not available.   She is on B12 supplement; last B12 level was supratherapeutic at 1848 on 05/01/2020. Last TSH on record was therapeutic at 4.007 on 03/01/2023.  Past medical and surgical history: Includes history of CAD with cardiac  arrest; COPD; GERD; essential hypertension; history of seizure disorder; and subdural hematoma. Procedures include abdominal hysterectomy, cardiac catheterization, CBAG, craniotomy; and bilateral knee replacements.  Family history: reviewed, non contributory due to advanced age.  Social history:non drinker;non smoker.   Review of systems: She validates occasional headaches which she relates to hypertension.  When asked what her last A1c was; her response was I think about 6.  She does validate shortness of breath in the context of oxygen dependent COPD and recent pneumonia.  She denies any bleeding dyscrasias.  She has some peripheral edema if legs remain dependent for extended period of time.  Constitutional: No fever, significant weight change, fatigue  Eyes: No redness, discharge, pain, vision change ENT/mouth: No nasal congestion, purulent discharge, earache, change in hearing, sore throat  Cardiovascular: No chest pain, palpitations, paroxysmal nocturnal dyspnea, claudication  Respiratory: No cough, sputum production, hemoptysis, significant snoring, apnea  Gastrointestinal: No heartburn, dysphagia, abdominal pain, nausea /vomiting, rectal bleeding, melena, change in bowels Genitourinary: No dysuria, hematuria, pyuria, incontinence, nocturia Musculoskeletal: No joint stiffness, joint swelling, weakness, pain Dermatologic: No rash, pruritus, change in appearance of skin Neurologic: No dizziness,  syncope, seizures, numbness, tingling Psychiatric: No significant anxiety, depression, insomnia, anorexia Endocrine: No change in hair/skin/nails, excessive thirst, excessive hunger, excessive urination  Hematologic/lymphatic: No significant bruising, lymphadenopathy, abnormal bleeding Allergy/immunology: No itchy/watery eyes, significant sneezing, urticaria, angioedema  Physical exam:  Pertinent or positive findings: She appears her age and somewhat chronically ill.  Lateral & anterior  deviation of the head is present due to torticollis.  She is markedly hard of hearing.  Eyebrows are essentially  absent.  Bilateral ptosis is noted.  Nasal oxygen is present.  Heart sounds are somewhat distant; there is a grade 1 blowing systolic murmur.  She has low grade rales.  Abdomen is protuberant.  The thorax essentially sits on the hips.  There is trace edema at the left sock line.  Pedal pulses are essentially not palpable.  She has marked arthritic change of the hands at the DIP and MCP joints especially, in a scattered distribution.  There appears to be some lateral deviation to the hands.  General appearance:  no acute distress, increased work of breathing is present.   Lymphatic: No lymphadenopathy about the head, neck, axilla. Eyes: No conjunctival inflammation or lid edema is present. There is no scleral icterus. Ears:  External ear exam shows no significant lesions or deformities.   Nose:  External nasal examination shows no deformity or inflammation. Nasal mucosa are pink and moist without lesions, exudates Oral exam: Lips and gums are healthy appearing. Neck:  No thyromegaly, masses, tenderness noted.    Heart:  Normal rate and regular rhythm. S1 and S2 normal without gallop, click, rub.  Lungs:  without wheezes, rhonchi, rubs. Abdomen: Bowel sounds are normal.  Abdomen is soft and nontender with no organomegaly, hernias, masses. GU: Deferred  Extremities:  No cyanosis, clubbing. Neurologic exam:  Balance, Rhomberg, finger to nose testing could not be completed due to clinical state Skin: Warm & dry w/o tenting. No significant lesions or rash.  See clinical summary under each active problem in the Problem List with associated updated therapeutic plan

## 2023-09-06 NOTE — Assessment & Plan Note (Addendum)
 Over the last several months there has been a progressive drop in the H/H with normochromic, normocytic indices.  On 9/2 H/H was 10.6/32.5, down from prior values of 12.8/39.9.  She denies any bleeding dyscrasias.  A copy of this report will be sent to her PCP.

## 2023-09-14 ENCOUNTER — Other Ambulatory Visit: Payer: Self-pay | Admitting: Adult Health

## 2023-09-14 DIAGNOSIS — G8929 Other chronic pain: Secondary | ICD-10-CM

## 2023-09-14 MED ORDER — HYDROCODONE-ACETAMINOPHEN 10-325 MG PO TABS: 1.0000 | ORAL_TABLET | Freq: Two times a day (BID) | ORAL | 0 refills | Status: DC

## 2023-09-15 ENCOUNTER — Encounter: Payer: Self-pay | Admitting: Adult Health

## 2023-09-15 ENCOUNTER — Non-Acute Institutional Stay (SKILLED_NURSING_FACILITY): Payer: Self-pay | Admitting: Adult Health

## 2023-09-15 DIAGNOSIS — E441 Mild protein-calorie malnutrition: Secondary | ICD-10-CM | POA: Diagnosis not present

## 2023-09-15 DIAGNOSIS — I700 Atherosclerosis of aorta: Secondary | ICD-10-CM

## 2023-09-15 DIAGNOSIS — J42 Unspecified chronic bronchitis: Secondary | ICD-10-CM | POA: Diagnosis not present

## 2023-09-15 NOTE — Progress Notes (Signed)
 Location:  [facility] Room Number: 118 D Place of Service:  SNF (31)   CODE STATUS: DNR   Allergies  Allergen Reactions   Lisinopril  Swelling   Egg-Derived Products Itching    Patient reports that she can eat eggs but does not tolerate egg derived products   Morphine  And Codeine     Headache   Penicillins Itching         Chief Complaint  Patient presents with   Care Plan Meeting    HPI:  We have come together for her care plan meeting. BIMS 15/15 mood 8/30:  not sleeping well; decreased energy; nervous; depression. She is using a wheelchair without falls. She requires moderate to dependent assist with her adl care. She is continent of bladder and bowel. Dietary: setup for meals; regular diet appetite 51-100%; weight is 146.4 pounds. Therapy: none at this time. Activities: does participate. She will continue to be followed for her chronic illnesses including:    Aortic atherosclerosis    Chronic bronchitis unspecified chronic bronchitis type Mild protein calorie malnutrition  Past Medical History:  Diagnosis Date   Anxiety    Arthritis    Cardiac arrest (HCC)    COPD (chronic obstructive pulmonary disease) (HCC)    Coronary artery disease    a. STEMI with cardiac arrest (polymorphic VT) s/p DES to RCA.   GERD (gastroesophageal reflux disease)    Headache(784.0)    Hearing loss, central    Left ear   Hematoma    Hypertension    Memory changes    Mild aortic stenosis    Mild carotid artery disease (HCC)    a. 1-39% by duplex 02/2018.   Pleural effusion, right    Seizures (HCC) 10/2012   No seizures, speech issues; after a fall, hitting head on left side   Subdural hematoma (HCC) 10/18/2012   UTI (urinary tract infection)    June 2022    Past Surgical History:  Procedure Laterality Date   ABDOMINAL HYSTERECTOMY     APPENDECTOMY     CARDIAC CATHETERIZATION     15 yrs ago   CORONARY/GRAFT ACUTE MI REVASCULARIZATION N/A 01/10/2018    Procedure: Coronary/Graft Acute MI Revascularization;  Surgeon: Court Dorn PARAS, MD;  Location: [facility];  Service: Cardiovascular;  Laterality: N/A;   CRANIOTOMY Left 10/30/2012   Procedure: CRANIOTOMY HEMATOMA EVACUATION SUBDURAL;  Surgeon: Fairy Levels, MD;  Location: [facility];  Service: Neurosurgery;  Laterality: Left;  Left Craniotomy for evacuation of subdural hematoma   JOINT REPLACEMENT Bilateral    knees   LEFT HEART CATH AND CORONARY ANGIOGRAPHY N/A 01/10/2018   Procedure: LEFT HEART CATH AND CORONARY ANGIOGRAPHY;  Surgeon: Court Dorn PARAS, MD;  Location: [facility];  Service: Cardiovascular;  Laterality: N/A;    Social History   Socioeconomic History   Marital status: Married    Spouse name: Not on file   Number of children: Not on file   Years of education: Not on file   Highest education level: Not on file  Occupational History   Not on file  Tobacco Use   Smoking status: Never   Smokeless tobacco: Never  Vaping Use   Vaping status: Never Used  Substance and Sexual Activity   Alcohol  use: No   Drug use: No   Sexual activity: Not on file  Other Topics Concern   Not on file  Social History Narrative   Not on file   Social Drivers  of Health   Financial Resource Strain: Low Risk  (04/24/2023)   Overall Financial Resource Strain (CARDIA)    Difficulty of Paying Living Expenses: Not hard at all  Food Insecurity: No Food Insecurity (04/24/2023)   Hunger Vital Sign    Worried About Running Out of Food in the Last Year: Never true    Ran Out of Food in the Last Year: Never true  Transportation Needs: No Transportation Needs (04/24/2023)   PRAPARE - Administrator, Civil Service (Medical): No    Lack of Transportation (Non-Medical): No  Physical Activity: Not on file  Stress: No Stress Concern Present (04/24/2023)   [employer] of Occupational Health - Occupational Stress Questionnaire    Feeling of Stress : Only a little   Social Connections: Socially Integrated (04/24/2023)   Social Connection and Isolation Panel    Frequency of Communication with Friends and Family: Three times a week    Frequency of Social Gatherings with Friends and Family: Three times a week    Attends Religious Services: More than 4 times per year    Active Member of Clubs or Organizations: Yes    Attends Banker Meetings: More than 4 times per year    Marital Status: Married  Catering manager Violence: Not At Risk (04/24/2023)   Humiliation, Afraid, Rape, and Kick questionnaire    Fear of Current or Ex-Partner: No    Emotionally Abused: No    Physically Abused: No    Sexually Abused: No   Family History  Problem Relation Age of Onset   Dementia Mother    Cancer Father    Dementia Maternal Aunt    Ataxia Neg Hx    Chorea Neg Hx    Mental retardation Neg Hx    Migraines Neg Hx    Multiple sclerosis Neg Hx    Neurofibromatosis Neg Hx    Neuropathy Neg Hx    Parkinsonism Neg Hx    Seizures Neg Hx    Stroke Neg Hx       VITAL SIGNS BP 138/70   Pulse 78   Temp (!) 97.4 F (36.3 C)   Resp 20   Ht 5' (1.524 m)   Wt 148 lb 9.6 oz (67.4 kg)   SpO2 95%   BMI 29.02 kg/m   Outpatient Encounter Medications as of 09/15/2023  Medication Sig   amLODipine  (NORVASC ) 5 MG tablet Take 5 mg by mouth daily.   aspirin  81 MG chewable tablet Chew 81 mg by mouth daily.   carvedilol  (COREG ) 12.5 MG tablet Take 1 tablet (12.5 mg total) by mouth 2 (two) times daily.   celecoxib  (CELEBREX ) 100 MG capsule Take 100 mg by mouth 2 (two) times daily.   cephALEXin  (KEFLEX ) 250 MG capsule Take 250 mg by mouth once. oral, Once A Day, For UTI prophylaxis.   Cholecalciferol  25 MCG (1000 UT) TBDP Take 1,000 Units by mouth daily.   cyanocobalamin 1000 MCG tablet Take 1,000 mcg by mouth daily.   D-Mannose (AZO D-MANNOSE) 500 MG CAPS Take 500 mg by mouth in the morning and at bedtime.   fluticasone  (FLONASE ) 50 MCG/ACT nasal spray Place 1  spray into both nostrils daily.   gabapentin  (NEURONTIN ) 100 MG capsule Take 100 mg by mouth 3 (three) times daily. oral, Three Times A Day - PRN, As needed for neuropathic pain.   HYDROcodone -acetaminophen  (NORCO) 10-325 MG tablet Take 1 tablet by mouth 2 (two) times daily.   latanoprost  (XALATAN ) 0.005 %  ophthalmic solution INSTILL ONE DROP IN BOTH EYES NIGHTLY   levalbuterol (XOPENEX) 1.25 MG/3ML nebulizer solution SMARTSIG:1 Vial(s) Via Nebulizer Every 8 Hours PRN   levETIRAcetam  (KEPPRA ) 250 MG tablet Take 250 mg by mouth 2 (two) times daily.   memantine  (NAMENDA ) 5 MG tablet Take 1 tablet (5 mg total) by mouth 2 (two) times daily.   methocarbamol  (ROBAXIN ) 500 MG tablet Take 500 mg by mouth every 6 (six) hours as needed.   nitroGLYCERIN  (NITROSTAT ) 0.4 MG SL tablet Place 1 tablet (0.4 mg total) under the tongue every 5 (five) minutes x 3 doses as needed for chest pain.   omeprazole  (PRILOSEC) 40 MG capsule Take 1 capsule (40 mg total) by mouth daily.   Probiotic Product (RISA-BID PROBIOTIC) TABS Take 250 tablets by mouth once.   risperiDONE  (RISPERDAL ) 0.5 MG tablet Take 0.5 mg by mouth at bedtime.   sertraline  (ZOLOFT ) 50 MG tablet Take 1 tablet (50 mg total) by mouth daily.   albuterol  (PROVENTIL ) (5 MG/ML) 0.5% nebulizer solution Take 0.5 mLs (2.5 mg total) by nebulization every 6 (six) hours as needed for wheezing or shortness of breath. (Patient not taking: Reported on 09/15/2023)   ALPRAZolam  (XANAX ) 0.5 MG tablet Take 1 tablet (0.5 mg total) by mouth 2 (two) times daily as needed for anxiety or sleep. (Patient not taking: Reported on 09/15/2023)   amLODipine  (NORVASC ) 10 MG tablet Take 1 tablet (10 mg total) by mouth daily. (Patient not taking: Reported on 09/15/2023)   Nutritional Supplements (ENSURE ENLIVE PO) Take 237 mLs by mouth daily. (Patient not taking: Reported on 09/15/2023)   risperiDONE  (RISPERDAL ) 0.5 MG tablet Take 1 tablet (0.5 mg total) by mouth 2 (two) times daily. (Patient  not taking: Reported on 09/15/2023)   [DISCONTINUED] lisinopril  (ZESTRIL ) 20 MG tablet Take 1 tablet (20 mg total) by mouth 2 (two) times daily.   No facility-administered encounter medications on file as of 09/15/2023.     SIGNIFICANT DIAGNOSTIC EXAMS  Review of Systems  Constitutional:  Negative for malaise/fatigue.  Respiratory:  Negative for cough and shortness of breath.   Cardiovascular:  Negative for chest pain, palpitations and leg swelling.  Gastrointestinal:  Negative for abdominal pain, constipation and heartburn.  Musculoskeletal:  Negative for back pain, joint pain and myalgias.  Skin: Negative.   Neurological:  Negative for dizziness.  Psychiatric/Behavioral:  The patient is not nervous/anxious.     Physical Exam Constitutional:      General: She is not in acute distress.    Appearance: She is well-developed. She is not diaphoretic.  Neck:     Thyroid : No thyromegaly.  Cardiovascular:     Rate and Rhythm: Normal rate and regular rhythm.     Heart sounds: Normal heart sounds.  Pulmonary:     Effort: Pulmonary effort is normal. No respiratory distress.     Breath sounds: Normal breath sounds.  Abdominal:     General: Bowel sounds are normal. There is no distension.     Palpations: Abdomen is soft.     Tenderness: There is no abdominal tenderness.  Musculoskeletal:        General: Normal range of motion.     Cervical back: Neck supple.     Right lower leg: No edema.     Left lower leg: No edema.  Lymphadenopathy:     Cervical: No cervical adenopathy.  Skin:    General: Skin is warm and dry.  Neurological:     Mental Status: She is alert. Mental status  is at baseline.  Psychiatric:        Mood and Affect: Mood normal.      ASSESSMENT/ PLAN:  TODAY  Aortic atherosclerosis Chronic bronchitis unspecified chronic bronchitis type Mild protein calorie malnutrition  Will continue current medications Will continue current plan of care Will continue to  monitor her status Will order palliative care she had been followed prior to her admission   Time spent with patient: 40 minutes: medications; dietary    Barnie Seip NP [facility]  call [phone]

## 2023-09-20 ENCOUNTER — Other Ambulatory Visit: Payer: Self-pay | Admitting: Adult Health

## 2023-09-20 MED ORDER — ALPRAZOLAM 0.25 MG PO TABS
0.2500 mg | ORAL_TABLET | Freq: Two times a day (BID) | ORAL | 0 refills | Status: DC | PRN
Start: 2023-09-20 — End: 2023-10-06

## 2023-09-27 DIAGNOSIS — Z9981 Dependence on supplemental oxygen: Secondary | ICD-10-CM | POA: Diagnosis not present

## 2023-09-27 DIAGNOSIS — Z515 Encounter for palliative care: Secondary | ICD-10-CM | POA: Diagnosis not present

## 2023-09-27 DIAGNOSIS — R2689 Other abnormalities of gait and mobility: Secondary | ICD-10-CM | POA: Diagnosis not present

## 2023-09-27 DIAGNOSIS — J449 Chronic obstructive pulmonary disease, unspecified: Secondary | ICD-10-CM | POA: Diagnosis not present

## 2023-10-03 ENCOUNTER — Non-Acute Institutional Stay (SKILLED_NURSING_FACILITY): Payer: Self-pay | Admitting: Adult Health

## 2023-10-03 ENCOUNTER — Encounter: Payer: Self-pay | Admitting: Adult Health

## 2023-10-03 DIAGNOSIS — F339 Major depressive disorder, recurrent, unspecified: Secondary | ICD-10-CM | POA: Diagnosis not present

## 2023-10-03 DIAGNOSIS — M1990 Unspecified osteoarthritis, unspecified site: Secondary | ICD-10-CM | POA: Diagnosis not present

## 2023-10-03 NOTE — Progress Notes (Signed)
 Location:  [facility] Room Number: 118 Place of Service:  SNF (31)   CODE STATUS: dnr   Allergies  Allergen Reactions   Lisinopril  Swelling   Egg-Derived Products Itching    Patient reports that she can eat eggs but does not tolerate egg derived products   Morphine  And Codeine     Headache   Penicillins Itching         Chief Complaint  Patient presents with   Acute Visit    Pain management and anxiety     HPI:  She is presently taking vicodin 10/325 mg twice daily for her pain management. She is not getting adequate relief at this this time. She has chronic anxiety for which she has taken xanax  for long term use. We have discussed her medication regimen. She has taken these medications for an extended period of time.   Past Medical History:  Diagnosis Date   Anxiety    Arthritis    Cardiac arrest (HCC)    COPD (chronic obstructive pulmonary disease) (HCC)    Coronary artery disease    a. STEMI with cardiac arrest (polymorphic VT) s/p DES to RCA.   GERD (gastroesophageal reflux disease)    Headache(784.0)    Hearing loss, central    Left ear   Hematoma    Hypertension    Memory changes    Mild aortic stenosis    Mild carotid artery disease    a. 1-39% by duplex 02/2018.   Pleural effusion, right    Seizures (HCC) 10/2012   No seizures, speech issues; after a fall, hitting head on left side   Subdural hematoma (HCC) 10/18/2012   UTI (urinary tract infection)    June 2022    Past Surgical History:  Procedure Laterality Date   ABDOMINAL HYSTERECTOMY     APPENDECTOMY     CARDIAC CATHETERIZATION     15 yrs ago   CORONARY/GRAFT ACUTE MI REVASCULARIZATION N/A 01/10/2018   Procedure: Coronary/Graft Acute MI Revascularization;  Surgeon: Court Dorn PARAS, MD;  Location: [facility];  Service: Cardiovascular;  Laterality: N/A;   CRANIOTOMY Left 10/30/2012   Procedure: CRANIOTOMY HEMATOMA EVACUATION SUBDURAL;  Surgeon: Fairy Levels,  MD;  Location: [facility];  Service: Neurosurgery;  Laterality: Left;  Left Craniotomy for evacuation of subdural hematoma   JOINT REPLACEMENT Bilateral    knees   LEFT HEART CATH AND CORONARY ANGIOGRAPHY N/A 01/10/2018   Procedure: LEFT HEART CATH AND CORONARY ANGIOGRAPHY;  Surgeon: Court Dorn PARAS, MD;  Location: [facility];  Service: Cardiovascular;  Laterality: N/A;    Social History   Socioeconomic History   Marital status: Married    Spouse name: Not on file   Number of children: Not on file   Years of education: Not on file   Highest education level: Not on file  Occupational History   Not on file  Tobacco Use   Smoking status: Never   Smokeless tobacco: Never  Vaping Use   Vaping status: Never Used  Substance and Sexual Activity   Alcohol  use: No   Drug use: No   Sexual activity: Not on file  Other Topics Concern   Not on file  Social History Narrative   Not on file   Social Drivers of Health   Financial Resource Strain: Low Risk  (04/24/2023)   Overall Financial Resource Strain (CARDIA)    Difficulty of Paying Living Expenses: Not hard at all  Food Insecurity:  No Food Insecurity (04/24/2023)   Hunger Vital Sign    Worried About Running Out of Food in the Last Year: Never true    Ran Out of Food in the Last Year: Never true  Transportation Needs: No Transportation Needs (04/24/2023)   PRAPARE - Administrator, Civil Service (Medical): No    Lack of Transportation (Non-Medical): No  Physical Activity: Not on file  Stress: No Stress Concern Present (04/24/2023)   [employer] of Occupational Health - Occupational Stress Questionnaire    Feeling of Stress : Only a little  Social Connections: Socially Integrated (04/24/2023)   Social Connection and Isolation Panel    Frequency of Communication with Friends and Family: Three times a week    Frequency of Social Gatherings with Friends and Family: Three times a week    Attends Religious  Services: More than 4 times per year    Active Member of Clubs or Organizations: Yes    Attends Banker Meetings: More than 4 times per year    Marital Status: Married  Catering manager Violence: Not At Risk (04/24/2023)   Humiliation, Afraid, Rape, and Kick questionnaire    Fear of Current or Ex-Partner: No    Emotionally Abused: No    Physically Abused: No    Sexually Abused: No   Family History  Problem Relation Age of Onset   Dementia Mother    Cancer Father    Dementia Maternal Aunt    Ataxia Neg Hx    Chorea Neg Hx    Mental retardation Neg Hx    Migraines Neg Hx    Multiple sclerosis Neg Hx    Neurofibromatosis Neg Hx    Neuropathy Neg Hx    Parkinsonism Neg Hx    Seizures Neg Hx    Stroke Neg Hx       VITAL SIGNS BP 128/76   Pulse 72   Temp (!) 97.3 F (36.3 C)   Resp 18   Ht 5' (1.524 m)   Wt 145 lb 3.2 oz (65.9 kg)   SpO2 95%   BMI 28.36 kg/m   Outpatient Encounter Medications as of 10/03/2023  Medication Sig   ALPRAZolam  (XANAX ) 0.25 MG tablet Take 1 tablet (0.25 mg total) by mouth 2 (two) times daily as needed for anxiety.   amLODipine  (NORVASC ) 5 MG tablet Take 5 mg by mouth daily.   aspirin  81 MG chewable tablet Chew 81 mg by mouth daily.   carvedilol  (COREG ) 12.5 MG tablet Take 1 tablet (12.5 mg total) by mouth 2 (two) times daily.   celecoxib  (CELEBREX ) 100 MG capsule Take 100 mg by mouth 2 (two) times daily.   cephALEXin  (KEFLEX ) 250 MG capsule Take 250 mg by mouth once. oral, Once A Day, For UTI prophylaxis.   Cholecalciferol  25 MCG (1000 UT) TBDP Take 1,000 Units by mouth daily.   cyanocobalamin 1000 MCG tablet Take 1,000 mcg by mouth daily.   D-Mannose (AZO D-MANNOSE) 500 MG CAPS Take 500 mg by mouth in the morning and at bedtime.   fluticasone  (FLONASE ) 50 MCG/ACT nasal spray Place 1 spray into both nostrils daily.   gabapentin  (NEURONTIN ) 100 MG capsule Take 100 mg by mouth 3 (three) times daily. oral, Three Times A Day - PRN, As  needed for neuropathic pain.   HYDROcodone -acetaminophen  (NORCO) 10-325 MG tablet Take 1 tablet by mouth 2 (two) times daily.   latanoprost  (XALATAN ) 0.005 % ophthalmic solution INSTILL ONE DROP IN BOTH EYES NIGHTLY  levalbuterol (XOPENEX) 1.25 MG/3ML nebulizer solution SMARTSIG:1 Vial(s) Via Nebulizer Every 8 Hours PRN   levETIRAcetam  (KEPPRA ) 250 MG tablet Take 250 mg by mouth 2 (two) times daily.   memantine  (NAMENDA ) 5 MG tablet Take 1 tablet (5 mg total) by mouth 2 (two) times daily.   methocarbamol  (ROBAXIN ) 500 MG tablet Take 500 mg by mouth every 6 (six) hours as needed.   nitroGLYCERIN  (NITROSTAT ) 0.4 MG SL tablet Place 1 tablet (0.4 mg total) under the tongue every 5 (five) minutes x 3 doses as needed for chest pain.   omeprazole  (PRILOSEC) 40 MG capsule Take 1 capsule (40 mg total) by mouth daily.   Probiotic Product (RISA-BID PROBIOTIC) TABS Take 250 tablets by mouth once.   risperiDONE  (RISPERDAL ) 0.5 MG tablet Take 0.5 mg by mouth at bedtime.   sertraline  (ZOLOFT ) 50 MG tablet Take 1 tablet (50 mg total) by mouth daily.   No facility-administered encounter medications on file as of 10/03/2023.     SIGNIFICANT DIAGNOSTIC EXAMS  Review of Systems  Constitutional:  Negative for malaise/fatigue.  Respiratory:  Negative for cough and shortness of breath.   Cardiovascular:  Negative for chest pain, palpitations and leg swelling.  Gastrointestinal:  Negative for abdominal pain, constipation and heartburn.  Musculoskeletal:  Negative for back pain, joint pain and myalgias.  Skin: Negative.   Neurological:  Negative for dizziness.  Psychiatric/Behavioral:  The patient is not nervous/anxious.    Physical Exam Constitutional:      General: She is not in acute distress.    Appearance: She is well-developed. She is not diaphoretic.  Neck:     Thyroid : No thyromegaly.  Cardiovascular:     Rate and Rhythm: Normal rate and regular rhythm.     Heart sounds: Normal heart sounds.   Pulmonary:     Effort: Pulmonary effort is normal. No respiratory distress.     Breath sounds: Normal breath sounds.  Abdominal:     General: Bowel sounds are normal. There is no distension.     Palpations: Abdomen is soft.     Tenderness: There is no abdominal tenderness.  Musculoskeletal:        General: Normal range of motion.     Cervical back: Neck supple.     Right lower leg: No edema.     Left lower leg: No edema.  Lymphadenopathy:     Cervical: No cervical adenopathy.  Skin:    General: Skin is warm and dry.  Neurological:     Mental Status: She is alert. Mental status is at baseline.  Psychiatric:        Mood and Affect: Mood normal.       ASSESSMENT/ PLAN:  TODAY  Arthritis Major depression chronic   Will continue vicodin 10/325 mg twice daily  Will add tylenol  er 650 mg every 6hours  Will extend xanax  0.5 mg twice daily as needed through 10-17-23.    Barnie Seip NP [facility]  call [phone]

## 2023-10-05 ENCOUNTER — Non-Acute Institutional Stay (SKILLED_NURSING_FACILITY): Payer: Self-pay | Admitting: Adult Health

## 2023-10-05 ENCOUNTER — Encounter: Payer: Self-pay | Admitting: Adult Health

## 2023-10-05 DIAGNOSIS — I10 Essential (primary) hypertension: Secondary | ICD-10-CM

## 2023-10-05 DIAGNOSIS — J42 Unspecified chronic bronchitis: Secondary | ICD-10-CM

## 2023-10-05 DIAGNOSIS — I700 Atherosclerosis of aorta: Secondary | ICD-10-CM

## 2023-10-05 NOTE — Progress Notes (Signed)
 Location:  [facility] Room Number: 118-D Place of Service:  SNF (31) Provider: Barnie Seip, NP  CODE STATUS: DNR  Allergies  Allergen Reactions   Lisinopril  Swelling   Egg-Derived Products Itching    Patient reports that she can eat eggs but does not tolerate egg derived products   Morphine  And Codeine     Headache   Penicillins Itching         Chief Complaint  Patient presents with   Medical Management of Chronic Issues              Aortic atherosclerosis     Primary hypertension:    Chronic bronchitis unspecified    HPI:  She is a 89 y.o. long term resident of this facility being seen for the management of her chronic illnesses: Aortic atherosclerosis     Primary hypertension:    Chronic bronchitis unspecified. She is denying any issues with anxiety; no complaints of pain present. Her weight remains stable.    Past Medical History:  Diagnosis Date   Anxiety    Arthritis    Cardiac arrest (HCC)    COPD (chronic obstructive pulmonary disease) (HCC)    Coronary artery disease    a. STEMI with cardiac arrest (polymorphic VT) s/p DES to RCA.   GERD (gastroesophageal reflux disease)    Headache(784.0)    Hearing loss, central    Left ear   Hematoma    Hypertension    Memory changes    Mild aortic stenosis    Mild carotid artery disease    a. 1-39% by duplex 02/2018.   Pleural effusion, right    Seizures (HCC) 10/2012   No seizures, speech issues; after a fall, hitting head on left side   Subdural hematoma (HCC) 10/18/2012   UTI (urinary tract infection)    June 2022    Past Surgical History:  Procedure Laterality Date   ABDOMINAL HYSTERECTOMY     APPENDECTOMY     CARDIAC CATHETERIZATION     15 yrs ago   CORONARY/GRAFT ACUTE MI REVASCULARIZATION N/A 01/10/2018   Procedure: Coronary/Graft Acute MI Revascularization;  Surgeon: Court Dorn PARAS, MD;  Location: [facility];  Service: Cardiovascular;  Laterality: N/A;   CRANIOTOMY  Left 10/30/2012   Procedure: CRANIOTOMY HEMATOMA EVACUATION SUBDURAL;  Surgeon: Fairy Levels, MD;  Location: [facility];  Service: Neurosurgery;  Laterality: Left;  Left Craniotomy for evacuation of subdural hematoma   JOINT REPLACEMENT Bilateral    knees   LEFT HEART CATH AND CORONARY ANGIOGRAPHY N/A 01/10/2018   Procedure: LEFT HEART CATH AND CORONARY ANGIOGRAPHY;  Surgeon: Court Dorn PARAS, MD;  Location: [facility];  Service: Cardiovascular;  Laterality: N/A;    Social History   Socioeconomic History   Marital status: Married    Spouse name: Not on file   Number of children: Not on file   Years of education: Not on file   Highest education level: Not on file  Occupational History   Not on file  Tobacco Use   Smoking status: Never   Smokeless tobacco: Never  Vaping Use   Vaping status: Never Used  Substance and Sexual Activity   Alcohol  use: No   Drug use: No   Sexual activity: Not on file  Other Topics Concern   Not on file  Social History Narrative   Not on file   Social Drivers of Health   Financial Resource Strain: Low Risk  (04/24/2023)  Overall Financial Resource Strain (CARDIA)    Difficulty of Paying Living Expenses: Not hard at all  Food Insecurity: No Food Insecurity (04/24/2023)   Hunger Vital Sign    Worried About Running Out of Food in the Last Year: Never true    Ran Out of Food in the Last Year: Never true  Transportation Needs: No Transportation Needs (04/24/2023)   PRAPARE - Administrator, Civil Service (Medical): No    Lack of Transportation (Non-Medical): No  Physical Activity: Not on file  Stress: No Stress Concern Present (04/24/2023)   [employer] of Occupational Health - Occupational Stress Questionnaire    Feeling of Stress : Only a little  Social Connections: Socially Integrated (04/24/2023)   Social Connection and Isolation Panel    Frequency of Communication with Friends and Family: Three times a week     Frequency of Social Gatherings with Friends and Family: Three times a week    Attends Religious Services: More than 4 times per year    Active Member of Clubs or Organizations: Yes    Attends Banker Meetings: More than 4 times per year    Marital Status: Married  Catering manager Violence: Not At Risk (04/24/2023)   Humiliation, Afraid, Rape, and Kick questionnaire    Fear of Current or Ex-Partner: No    Emotionally Abused: No    Physically Abused: No    Sexually Abused: No   Family History  Problem Relation Age of Onset   Dementia Mother    Cancer Father    Dementia Maternal Aunt    Ataxia Neg Hx    Chorea Neg Hx    Mental retardation Neg Hx    Migraines Neg Hx    Multiple sclerosis Neg Hx    Neurofibromatosis Neg Hx    Neuropathy Neg Hx    Parkinsonism Neg Hx    Seizures Neg Hx    Stroke Neg Hx       VITAL SIGNS BP 128/76   Pulse 72   Temp (!) 97.3 F (36.3 C)   Resp 18   Ht 5' (1.524 m)   Wt 144 lb 6.4 oz (65.5 kg)   SpO2 96%   BMI 28.20 kg/m   Outpatient Encounter Medications as of 10/05/2023  Medication Sig   acetaminophen  (TYLENOL ) 325 MG tablet Take 650 mg by mouth every 6 (six) hours as needed.   ALPRAZolam  (XANAX ) 0.25 MG tablet Take 1 tablet (0.25 mg total) by mouth 2 (two) times daily as needed for anxiety.   amLODipine  (NORVASC ) 5 MG tablet Take 5 mg by mouth daily.   aspirin  81 MG chewable tablet Chew 81 mg by mouth daily.   bisacodyl  (DULCOLAX) 10 MG suppository Place 10 mg rectally as needed for moderate constipation.   carvedilol  (COREG ) 12.5 MG tablet Take 1 tablet (12.5 mg total) by mouth 2 (two) times daily.   celecoxib  (CELEBREX ) 100 MG capsule Take 100 mg by mouth 2 (two) times daily.   cephALEXin  (KEFLEX ) 250 MG capsule Take 250 mg by mouth once. oral, Once A Day, For UTI prophylaxis.   Cholecalciferol  25 MCG (1000 UT) TBDP Take 1,000 Units by mouth daily.   cyanocobalamin 1000 MCG tablet Take 1,000 mcg by mouth daily.    D-Mannose (AZO D-MANNOSE) 500 MG CAPS Take 500 mg by mouth in the morning and at bedtime.   fluticasone  (FLONASE ) 50 MCG/ACT nasal spray Place 1 spray into both nostrils daily.   gabapentin  (NEURONTIN ) 100 MG  capsule Take 100 mg by mouth 3 (three) times daily. oral, Three Times A Day - PRN, As needed for neuropathic pain.   HYDROcodone -acetaminophen  (NORCO) 10-325 MG tablet Take 1 tablet by mouth 2 (two) times daily.   latanoprost  (XALATAN ) 0.005 % ophthalmic solution INSTILL ONE DROP IN BOTH EYES NIGHTLY   levalbuterol (XOPENEX) 1.25 MG/3ML nebulizer solution SMARTSIG:1 Vial(s) Via Nebulizer Every 8 Hours PRN   levETIRAcetam  (KEPPRA ) 250 MG tablet Take 250 mg by mouth 2 (two) times daily.   memantine  (NAMENDA ) 5 MG tablet Take 1 tablet (5 mg total) by mouth 2 (two) times daily.   methocarbamol  (ROBAXIN ) 500 MG tablet Take 500 mg by mouth every 6 (six) hours as needed.   nitroGLYCERIN  (NITROSTAT ) 0.4 MG SL tablet Place 1 tablet (0.4 mg total) under the tongue every 5 (five) minutes x 3 doses as needed for chest pain.   omeprazole  (PRILOSEC) 40 MG capsule Take 1 capsule (40 mg total) by mouth daily.   Probiotic Product (RISA-BID PROBIOTIC) TABS Take 250 tablets by mouth daily.   risperiDONE  (RISPERDAL ) 0.5 MG tablet Take 0.5 mg by mouth at bedtime.   sennosides-docusate sodium  (SENOKOT-S) 8.6-50 MG tablet Take 1 tablet by mouth every other day.   sertraline  (ZOLOFT ) 50 MG tablet Take 1 tablet (50 mg total) by mouth daily.   [DISCONTINUED] lisinopril  (ZESTRIL ) 20 MG tablet Take 1 tablet (20 mg total) by mouth 2 (two) times daily.   No facility-administered encounter medications on file as of 10/05/2023.     SIGNIFICANT DIAGNOSTIC EXAMS  Review of Systems  Constitutional:  Negative for malaise/fatigue.  Respiratory:  Negative for cough and shortness of breath.   Cardiovascular:  Negative for chest pain, palpitations and leg swelling.  Gastrointestinal:  Negative for abdominal pain,  constipation and heartburn.  Musculoskeletal:  Negative for back pain, joint pain and myalgias.  Skin: Negative.   Neurological:  Negative for dizziness.  Psychiatric/Behavioral:  The patient is not nervous/anxious.     Physical Exam Constitutional:      General: She is not in acute distress.    Appearance: She is well-developed. She is not diaphoretic.  Neck:     Thyroid : No thyromegaly.  Cardiovascular:     Rate and Rhythm: Normal rate and regular rhythm.     Heart sounds: Normal heart sounds.  Pulmonary:     Effort: Pulmonary effort is normal. No respiratory distress.     Breath sounds: Normal breath sounds.  Abdominal:     General: Bowel sounds are normal. There is no distension.     Palpations: Abdomen is soft.     Tenderness: There is no abdominal tenderness.  Musculoskeletal:        General: Normal range of motion.     Cervical back: Neck supple.     Right lower leg: No edema.     Left lower leg: No edema.  Lymphadenopathy:     Cervical: No cervical adenopathy.  Skin:    General: Skin is warm and dry.  Neurological:     Mental Status: She is alert. Mental status is at baseline.  Psychiatric:        Mood and Affect: Mood normal.       ASSESSMENT/ PLAN:  TODAY  Aortic atherosclerosis (ct 05-08-20) is on asa; not on statin due to advanced age  62.  Primary hypertension: b/p 128/76: will continue asa 81 mg daily; norvasc  5 mg daily coreg  12.5 mg twice daily   3. Chronic bronchitis unspecified: will continue  flonase  daily; xopenex 1.25 mg every 8 hours as needed   PREVIOUS   4. GERD without esophagitis: will continue prilosec 40 mg daily  5. 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 Arthritis: will continue celebrex  100 mg twice daily and vicodin 10/325 mg twice daily; gabapentin  100 mg three times daily; robaxin  500 mg every 6  hours as needed    7. Primary open angle glaucoma both eyes stage mild: will continue xalatan  to both eyes nightly  8. Mild protein calorie malnutrition: will continue ensure daily   9. MCI (Mild cognitive impairment): will continue namenda  5 mg twice daily   10. Major depression recurrent chronic: will continue Risperdal  0.5 mg twice daily and zoloft  50 mg daily; xanax  0.5 mg twice daily as needed   11.  Chronic UTI: will continue keflex  250 mg daily and d-mannose twice daily   12. Coronary artery disease involving native coronary artery of native with angina pectoris: has ntg prn.      Barnie Seip NP [facility]   call [phone]

## 2023-10-06 ENCOUNTER — Other Ambulatory Visit: Payer: Self-pay | Admitting: Adult Health

## 2023-10-06 MED ORDER — ALPRAZOLAM 0.25 MG PO TABS: 0.2500 mg | ORAL_TABLET | Freq: Two times a day (BID) | ORAL | 0 refills | Status: DC | PRN

## 2023-10-12 ENCOUNTER — Other Ambulatory Visit: Payer: Self-pay | Admitting: Adult Health

## 2023-10-12 DIAGNOSIS — G8929 Other chronic pain: Secondary | ICD-10-CM

## 2023-10-12 MED ORDER — HYDROCODONE-ACETAMINOPHEN 10-325 MG PO TABS: 1.0000 | ORAL_TABLET | Freq: Two times a day (BID) | ORAL | 0 refills | Status: DC

## 2023-10-13 ENCOUNTER — Non-Acute Institutional Stay (SKILLED_NURSING_FACILITY): Payer: Self-pay | Admitting: Adult Health

## 2023-10-13 ENCOUNTER — Encounter: Payer: Self-pay | Admitting: Adult Health

## 2023-10-13 ENCOUNTER — Other Ambulatory Visit: Payer: Self-pay | Admitting: Adult Health

## 2023-10-13 DIAGNOSIS — J42 Unspecified chronic bronchitis: Secondary | ICD-10-CM | POA: Diagnosis not present

## 2023-10-13 DIAGNOSIS — I700 Atherosclerosis of aorta: Secondary | ICD-10-CM

## 2023-10-13 DIAGNOSIS — E441 Mild protein-calorie malnutrition: Secondary | ICD-10-CM

## 2023-10-13 MED ORDER — ALPRAZOLAM 0.5 MG PO TABS: 0.5000 mg | ORAL_TABLET | Freq: Every day | ORAL | 0 refills | Status: DC

## 2023-10-13 NOTE — Progress Notes (Signed)
 Location:  [facility] Room Number: 118 D Place of Service:  SNF (31)   CODE STATUS: DNR   Allergies  Allergen Reactions   Lisinopril  Swelling   Egg Protein-Containing Drug Products Itching    Patient reports that she can eat eggs but does not tolerate egg derived products   Morphine  And Codeine     Headache   Penicillins Itching         Chief Complaint  Patient presents with   Care Plan Meeting    HPI:  We have come together for her care plan meeting. Family present. BIMS 15/15 mood 8/30: not sleeping well; decreased energy; some depression; nervous at times. She uses wheelchair without falls. She requires independent to moderate assist with her adl care. She is continent of bladder and bowel. Dietary: feeds self; regular diet appetite 51-100%; weight is 144.4 pounds.Therapy: none at this time. Activities: does play bingo. We have had a prolonged discussion regarding her xanax  usage. She has been taking xanax  0.5 mg twice daily for the past 40 years.  She will continue to be followed for her chronic illnesses including:  Aortic atherosclerosis    Chronic bronchitis unspecified chronic bronchitis type    Mild protein calorie malnutrition   Past Medical History:  Diagnosis Date   Anxiety    Arthritis    Cardiac arrest (HCC)    COPD (chronic obstructive pulmonary disease) (HCC)    Coronary artery disease    a. STEMI with cardiac arrest (polymorphic VT) s/p DES to RCA.   GERD (gastroesophageal reflux disease)    Headache(784.0)    Hearing loss, central    Left ear   Hematoma    Hypertension    Memory changes    Mild aortic stenosis    Mild carotid artery disease    a. 1-39% by duplex 02/2018.   Pleural effusion, right    Seizures (HCC) 10/2012   No seizures, speech issues; after a fall, hitting head on left side   Subdural hematoma (HCC) 10/18/2012   UTI (urinary tract infection)    June 2022    Past Surgical History:  Procedure Laterality  Date   ABDOMINAL HYSTERECTOMY     APPENDECTOMY     CARDIAC CATHETERIZATION     15 yrs ago   CORONARY/GRAFT ACUTE MI REVASCULARIZATION N/A 01/10/2018   Procedure: Coronary/Graft Acute MI Revascularization;  Surgeon: Court Dorn PARAS, MD;  Location: [facility];  Service: Cardiovascular;  Laterality: N/A;   CRANIOTOMY Left 10/30/2012   Procedure: CRANIOTOMY HEMATOMA EVACUATION SUBDURAL;  Surgeon: Fairy Levels, MD;  Location: [facility];  Service: Neurosurgery;  Laterality: Left;  Left Craniotomy for evacuation of subdural hematoma   JOINT REPLACEMENT Bilateral    knees   LEFT HEART CATH AND CORONARY ANGIOGRAPHY N/A 01/10/2018   Procedure: LEFT HEART CATH AND CORONARY ANGIOGRAPHY;  Surgeon: Court Dorn PARAS, MD;  Location: [facility];  Service: Cardiovascular;  Laterality: N/A;    Social History   Socioeconomic History   Marital status: Married    Spouse name: Not on file   Number of children: Not on file   Years of education: Not on file   Highest education level: Not on file  Occupational History   Not on file  Tobacco Use   Smoking status: Never   Smokeless tobacco: Never  Vaping Use   Vaping status: Never Used  Substance and Sexual Activity   Alcohol  use: No   Drug use: No  Sexual activity: Not on file  Other Topics Concern   Not on file  Social History Narrative   Not on file   Social Drivers of Health   Financial Resource Strain: Low Risk  (04/24/2023)   Overall Financial Resource Strain (CARDIA)    Difficulty of Paying Living Expenses: Not hard at all  Food Insecurity: No Food Insecurity (04/24/2023)   Hunger Vital Sign    Worried About Running Out of Food in the Last Year: Never true    Ran Out of Food in the Last Year: Never true  Transportation Needs: No Transportation Needs (04/24/2023)   PRAPARE - Administrator, Civil Service (Medical): No    Lack of Transportation (Non-Medical): No  Physical Activity: Not on file  Stress: No  Stress Concern Present (04/24/2023)   [employer] of Occupational Health - Occupational Stress Questionnaire    Feeling of Stress : Only a little  Social Connections: Socially Integrated (04/24/2023)   Social Connection and Isolation Panel    Frequency of Communication with Friends and Family: Three times a week    Frequency of Social Gatherings with Friends and Family: Three times a week    Attends Religious Services: More than 4 times per year    Active Member of Clubs or Organizations: Yes    Attends Banker Meetings: More than 4 times per year    Marital Status: Married  Catering manager Violence: Not At Risk (04/24/2023)   Humiliation, Afraid, Rape, and Kick questionnaire    Fear of Current or Ex-Partner: No    Emotionally Abused: No    Physically Abused: No    Sexually Abused: No   Family History  Problem Relation Age of Onset   Dementia Mother    Cancer Father    Dementia Maternal Aunt    Ataxia Neg Hx    Chorea Neg Hx    Mental retardation Neg Hx    Migraines Neg Hx    Multiple sclerosis Neg Hx    Neurofibromatosis Neg Hx    Neuropathy Neg Hx    Parkinsonism Neg Hx    Seizures Neg Hx    Stroke Neg Hx       VITAL SIGNS BP 128/67   Pulse 84   Temp (!) 96.5 F (35.8 C)   Resp 20   Ht 5' (1.524 m)   Wt 144 lb 6.4 oz (65.5 kg)   SpO2 98%   BMI 28.20 kg/m   Outpatient Encounter Medications as of 10/13/2023  Medication Sig   acetaminophen  (TYLENOL ) 325 MG tablet Take 650 mg by mouth every 6 (six) hours as needed.   ALPRAZolam  (XANAX ) 0.25 MG tablet Take 1 tablet (0.25 mg total) by mouth 2 (two) times daily as needed for anxiety.   amLODipine  (NORVASC ) 5 MG tablet Take 5 mg by mouth daily.   aspirin  81 MG chewable tablet Chew 81 mg by mouth daily.   bisacodyl  (DULCOLAX) 10 MG suppository Place 10 mg rectally as needed for moderate constipation.   carvedilol  (COREG ) 12.5 MG tablet Take 1 tablet (12.5 mg total) by mouth 2 (two) times daily.    celecoxib  (CELEBREX ) 100 MG capsule Take 100 mg by mouth 2 (two) times daily.   cephALEXin  (KEFLEX ) 250 MG capsule Take 250 mg by mouth once. oral, Once A Day, For UTI prophylaxis.   Cholecalciferol  25 MCG (1000 UT) TBDP Take 1,000 Units by mouth daily.   cyanocobalamin 1000 MCG tablet Take 1,000 mcg by mouth daily.  D-Mannose (AZO D-MANNOSE) 500 MG CAPS Take 500 mg by mouth in the morning and at bedtime.   fluticasone  (FLONASE ) 50 MCG/ACT nasal spray Place 1 spray into both nostrils daily.   gabapentin  (NEURONTIN ) 100 MG capsule Take 100 mg by mouth 3 (three) times daily. oral, Three Times A Day - PRN, As needed for neuropathic pain.   HYDROcodone -acetaminophen  (NORCO) 10-325 MG tablet Take 1 tablet by mouth 2 (two) times daily.   latanoprost  (XALATAN ) 0.005 % ophthalmic solution INSTILL ONE DROP IN BOTH EYES NIGHTLY   levalbuterol (XOPENEX) 1.25 MG/3ML nebulizer solution SMARTSIG:1 Vial(s) Via Nebulizer Every 8 Hours PRN   levETIRAcetam  (KEPPRA ) 250 MG tablet Take 250 mg by mouth 2 (two) times daily.   memantine  (NAMENDA ) 5 MG tablet Take 1 tablet (5 mg total) by mouth 2 (two) times daily.   methocarbamol  (ROBAXIN ) 500 MG tablet Take 500 mg by mouth every 6 (six) hours as needed.   nitroGLYCERIN  (NITROSTAT ) 0.4 MG SL tablet Place 1 tablet (0.4 mg total) under the tongue every 5 (five) minutes x 3 doses as needed for chest pain.   omeprazole  (PRILOSEC) 40 MG capsule Take 1 capsule (40 mg total) by mouth daily.   Probiotic Product (RISA-BID PROBIOTIC) TABS Take 250 tablets by mouth daily.   risperiDONE  (RISPERDAL ) 0.5 MG tablet Take 0.5 mg by mouth at bedtime.   sennosides-docusate sodium  (SENOKOT-S) 8.6-50 MG tablet Take 1 tablet by mouth every other day.   sertraline  (ZOLOFT ) 50 MG tablet Take 1 tablet (50 mg total) by mouth daily.   [DISCONTINUED] lisinopril  (ZESTRIL ) 20 MG tablet Take 1 tablet (20 mg total) by mouth 2 (two) times daily.   No facility-administered encounter medications on  file as of 10/13/2023.     SIGNIFICANT DIAGNOSTIC EXAMS  Review of Systems  Constitutional:  Negative for malaise/fatigue.  Respiratory:  Negative for cough and shortness of breath.   Cardiovascular:  Negative for chest pain, palpitations and leg swelling.  Gastrointestinal:  Negative for abdominal pain, constipation and heartburn.  Musculoskeletal:  Negative for back pain, joint pain and myalgias.  Skin: Negative.   Neurological:  Negative for dizziness.  Psychiatric/Behavioral:  The patient is not nervous/anxious.     Physical Exam Constitutional:      General: She is not in acute distress.    Appearance: She is well-developed. She is not diaphoretic.  Neck:     Thyroid : No thyromegaly.  Cardiovascular:     Rate and Rhythm: Normal rate and regular rhythm.     Heart sounds: Murmur heard.  Pulmonary:     Effort: Pulmonary effort is normal. No respiratory distress.     Breath sounds: Normal breath sounds.  Abdominal:     General: Bowel sounds are normal. There is no distension.     Palpations: Abdomen is soft.     Tenderness: There is no abdominal tenderness.  Musculoskeletal:        General: Normal range of motion.     Cervical back: Neck supple.     Right lower leg: No edema.     Left lower leg: No edema.     Comments: Kyphosis   Lymphadenopathy:     Cervical: No cervical adenopathy.  Skin:    General: Skin is warm and dry.  Neurological:     Mental Status: She is alert. Mental status is at baseline.  Psychiatric:        Mood and Affect: Mood normal.      ASSESSMENT/ PLAN:  TODAY  Aortic  atherosclerosis Chronic bronchitis unspecified chronic bronchitis type Mild protein calorie malnutrition   Will change xanax  0.5 mg daily and daily as needed  Will keep hydrocodone  twice daily Will continue robaxin  as needed; family will ask for this medication  if needed while they visit.  Will continue current plan of care Will continue to monitor her status.   Time  spent with patient: 40 minutes: dietary; medications; plan of care.    Barnie Seip NP [facility]  call [phone]

## 2023-11-02 ENCOUNTER — Non-Acute Institutional Stay (SKILLED_NURSING_FACILITY): Admitting: Adult Health

## 2023-11-02 ENCOUNTER — Encounter: Payer: Self-pay | Admitting: Adult Health

## 2023-11-02 DIAGNOSIS — Z0000 Encounter for general adult medical examination without abnormal findings: Secondary | ICD-10-CM | POA: Diagnosis not present

## 2023-11-02 NOTE — Progress Notes (Signed)
 Subjective:   Beverly [name] is a 89 y.o. female who presents for Medicare Annual (Subsequent) preventive examination.  Visit Complete: In person  Patient Medicare AWV questionnaire was completed by the patient on [id number]; I have confirmed that all information answered by patient is correct and no changes since this date.  Cardiac Risk Factors include: advanced age (>71men, >54 women);sedentary lifestyle     Objective:    Today's Vitals   11/02/23 1338  BP: 127/75  Pulse: 74  Resp: 20  Temp: (!) 97.5 F (36.4 C)  SpO2: 97%  Weight: 144 lb 6.4 oz (65.5 kg)  Height: 5' (1.524 m)   Body mass index is 28.2 kg/m.     10/05/2023   12:02 PM 04/28/2023    3:03 PM 03/17/2023   11:16 AM 03/02/2023   12:41 PM 03/02/2023   12:35 PM 03/01/2023   10:19 AM 07/14/2022    8:02 PM  Advanced Directives  Does Patient Have a Medical Advance Directive? Yes No Yes Yes  Yes Yes  Type of Advance Directive Out of facility DNR (pink MOST or yellow form)  Out of facility DNR (pink MOST or yellow form) Healthcare Power of [employer] Power of [employer] Power of [place];Living will   Does patient want to make changes to medical advance directive? No - Patient declined  No - Patient declined No - Patient declined   No - Patient declined  Copy of Healthcare Power of Attorney in Chart?    No - copy requested     Would patient like information on creating a medical advance directive?  No - Patient declined         Current Medications (verified) Outpatient Encounter Medications as of 11/02/2023  Medication Sig   acetaminophen  (TYLENOL ) 325 MG tablet Take 650 mg by mouth every 6 (six) hours as needed.   ALPRAZolam  (XANAX ) 0.5 MG tablet Take 1 tablet (0.5 mg total) by mouth daily at 12 noon. May also have one other time daily as needed   amLODipine  (NORVASC ) 5 MG tablet Take 5 mg by mouth daily.   aspirin  81 MG chewable tablet Chew 81 mg by mouth daily.   bisacodyl  (DULCOLAX) 10 MG  suppository Place 10 mg rectally as needed for moderate constipation.   carvedilol  (COREG ) 12.5 MG tablet Take 1 tablet (12.5 mg total) by mouth 2 (two) times daily.   celecoxib  (CELEBREX ) 100 MG capsule Take 100 mg by mouth 2 (two) times daily.   cephALEXin  (KEFLEX ) 250 MG capsule Take 250 mg by mouth once. oral, Once A Day, For UTI prophylaxis.   Cholecalciferol  25 MCG (1000 UT) TBDP Take 1,000 Units by mouth daily.   cyanocobalamin 1000 MCG tablet Take 1,000 mcg by mouth daily.   D-Mannose (AZO D-MANNOSE) 500 MG CAPS Take 500 mg by mouth in the morning and at bedtime.   fluticasone  (FLONASE ) 50 MCG/ACT nasal spray Place 1 spray into both nostrils daily.   gabapentin  (NEURONTIN ) 100 MG capsule Take 100 mg by mouth 3 (three) times daily. oral, Three Times A Day - PRN, As needed for neuropathic pain.   HYDROcodone -acetaminophen  (NORCO) 10-325 MG tablet Take 1 tablet by mouth 2 (two) times daily.   latanoprost  (XALATAN ) 0.005 % ophthalmic solution INSTILL ONE DROP IN BOTH EYES NIGHTLY   levalbuterol (XOPENEX) 1.25 MG/3ML nebulizer solution SMARTSIG:1 Vial(s) Via Nebulizer Every 8 Hours PRN   levETIRAcetam  (KEPPRA ) 250 MG tablet Take 250 mg by mouth 2 (two) times daily.   memantine  (NAMENDA )  5 MG tablet Take 1 tablet (5 mg total) by mouth 2 (two) times daily.   methocarbamol  (ROBAXIN ) 500 MG tablet Take 500 mg by mouth every 6 (six) hours as needed.   nitroGLYCERIN  (NITROSTAT ) 0.4 MG SL tablet Place 1 tablet (0.4 mg total) under the tongue every 5 (five) minutes x 3 doses as needed for chest pain.   omeprazole  (PRILOSEC) 40 MG capsule Take 1 capsule (40 mg total) by mouth daily.   Probiotic Product (RISA-BID PROBIOTIC) TABS Take 250 tablets by mouth daily.   risperiDONE  (RISPERDAL ) 0.5 MG tablet Take 0.5 mg by mouth at bedtime.   sennosides-docusate sodium  (SENOKOT-S) 8.6-50 MG tablet Take 1 tablet by mouth every other day.   sertraline  (ZOLOFT ) 50 MG tablet Take 1 tablet (50 mg total) by mouth  daily.   [DISCONTINUED] lisinopril  (ZESTRIL ) 20 MG tablet Take 1 tablet (20 mg total) by mouth 2 (two) times daily.   No facility-administered encounter medications on file as of 11/02/2023.    Allergies (verified) Lisinopril , Egg protein-containing drug products, Morphine  and codeine, and Penicillins   History: Past Medical History:  Diagnosis Date   Anxiety    Arthritis    Cardiac arrest (HCC)    COPD (chronic obstructive pulmonary disease) (HCC)    Coronary artery disease    a. STEMI with cardiac arrest (polymorphic VT) s/p DES to RCA.   GERD (gastroesophageal reflux disease)    Headache(784.0)    Hearing loss, central    Left ear   Hematoma    Hypertension    Memory changes    Mild aortic stenosis    Mild carotid artery disease    a. 1-39% by duplex 02/2018.   Pleural effusion, right    Seizures (HCC) 10/2012   No seizures, speech issues; after a fall, hitting head on left side   Subdural hematoma (HCC) 10/18/2012   UTI (urinary tract infection)    June 2022   Past Surgical History:  Procedure Laterality Date   ABDOMINAL HYSTERECTOMY     APPENDECTOMY     CARDIAC CATHETERIZATION     15 yrs ago   CORONARY/GRAFT ACUTE MI REVASCULARIZATION N/A 01/10/2018   Procedure: Coronary/Graft Acute MI Revascularization;  Surgeon: Court Dorn PARAS, MD;  Location: [facility];  Service: Cardiovascular;  Laterality: N/A;   CRANIOTOMY Left 10/30/2012   Procedure: CRANIOTOMY HEMATOMA EVACUATION SUBDURAL;  Surgeon: Fairy Levels, MD;  Location: [facility]UR[facility];  Service: Neurosurgery;  Laterality: Left;  Left Craniotomy for evacuation of subdural hematoma   JOINT REPLACEMENT Bilateral    knees   LEFT HEART CATH AND CORONARY ANGIOGRAPHY N/A 01/10/2018   Procedure: LEFT HEART CATH AND CORONARY ANGIOGRAPHY;  Surgeon: Court Dorn PARAS, MD;  Location: [facility];  Service: Cardiovascular;  Laterality: N/A;   Family History  Problem Relation Age of Onset   Dementia Mother     Cancer Father    Dementia Maternal Aunt    Ataxia Neg Hx    Chorea Neg Hx    Mental retardation Neg Hx    Migraines Neg Hx    Multiple sclerosis Neg Hx    Neurofibromatosis Neg Hx    Neuropathy Neg Hx    Parkinsonism Neg Hx    Seizures Neg Hx    Stroke Neg Hx    Social History   Socioeconomic History   Marital status: Married    Spouse name: Not on file   Number of children: Not on file   Years of education:  Not on file   Highest education level: Not on file  Occupational History   Not on file  Tobacco Use   Smoking status: Never   Smokeless tobacco: Never  Vaping Use   Vaping status: Never Used  Substance and Sexual Activity   Alcohol  use: No   Drug use: No   Sexual activity: Not on file  Other Topics Concern   Not on file  Social History Narrative   Not on file   Social Drivers of Health   Financial Resource Strain: Low Risk  (04/24/2023)   Overall Financial Resource Strain (CARDIA)    Difficulty of Paying Living Expenses: Not hard at all  Food Insecurity: No Food Insecurity (04/24/2023)   Hunger Vital Sign    Worried About Running Out of Food in the Last Year: Never true    Ran Out of Food in the Last Year: Never true  Transportation Needs: No Transportation Needs (04/24/2023)   PRAPARE - Administrator, Civil Service (Medical): No    Lack of Transportation (Non-Medical): No  Physical Activity: Not on file  Stress: No Stress Concern Present (04/24/2023)   [employer] of Occupational Health - Occupational Stress Questionnaire    Feeling of Stress : Only a little  Social Connections: Socially Integrated (04/24/2023)   Social Connection and Isolation Panel    Frequency of Communication with Friends and Family: Three times a week    Frequency of Social Gatherings with Friends and Family: Three times a week    Attends Religious Services: More than 4 times per year    Active Member of Clubs or Organizations: Yes    Attends Hospital Doctor: More than 4 times per year    Marital Status: Married    Tobacco Counseling Counseling given: Not Answered   Clinical Intake:  Pre-visit preparation completed: Yes  Pain : No/denies pain     BMI - recorded: 28.2 Nutritional Status: BMI 25 -29 Overweight Nutritional Risks: Unintentional weight gain Diabetes: No  How often do you need to have someone help you when you read instructions, pamphlets, or other written materials from your doctor or pharmacy?: 5 - Always  Interpreter Needed?: No      Activities of Daily Living    11/02/2023    1:45 PM 03/02/2023   12:35 PM  In your present state of health, do you have any difficulty performing the following activities:  Hearing? 0 1  Vision? 0 0  Difficulty concentrating or making decisions? 1 0  Walking or climbing stairs? 1   Dressing or bathing? 1   Doing errands, shopping? 1 1  Preparing Food and eating ? Y   Using the Toilet? Y   In the past six months, have you accidently leaked urine? Y   Do you have problems with loss of bowel control? Y   Managing your Medications? Y   Managing your Finances? Y   Housekeeping or managing your Housekeeping? Y     Patient Care Team: Shona Norleen PEDLAR, MD as PCP - General (Internal Medicine) Debera Jayson MATSU, MD as PCP - Cardiology (Cardiology) Steva, [name]   Indicate any recent Medical Services you may have received from other than [facility] providers in the past year (date may be approximate).     Assessment:   This is a routine wellness examination for Feliza.  Hearing/Vision screen No results found.   Goals Addressed  This Visit's Progress    Absence of Fall and Fall-Related Injury       Evidence-based guidance:  Assess fall risk using a validated tool when available. Consider balance and gait impairment, muscle weakness, diminished vision or hearing, environmental hazards, presence of urinary or bowel urgency and/or  incontinence.  Communicate fall injury risk to interprofessional healthcare team.  Develop a fall prevention plan with the patient and family.  Promote use of personal vision and auditory aids.  Promote reorientation, appropriate sensory stimulation, and routines to decrease risk of fall when changes in mental status are present.  Assess assistance level required for safe and effective self-care; consider referral for home care.  Encourage physical activity, such as performance of self-care at highest level of ability, strength and balance exercise program, and provision of appropriate assistive devices; refer to rehabilitation therapy.  Refer to community-based fall prevention program where available.  If fall occurs, determine the cause and revise fall injury prevention plan.  Regularly review medication contribution to fall risk; consider risk related to polypharmacy and age.  Refer to pharmacist for consultation when concerns about medications are revealed.  Balance adequate pain management with potential for oversedation.  Provide guidance related to environmental modifications.  Consider supplementation with Vitamin D .   Notes:      DIET - INCREASE WATER INTAKE       General - Client will not be readmitted within 30 days (C-SNP)         Depression Screen    11/02/2023    1:48 PM 04/24/2023   11:49 AM 09/12/2022   10:31 AM 08/12/2022   12:31 PM  PHQ 2/9 Scores  PHQ - 2 Score 0 0 0 0    Fall Risk    11/02/2023    1:47 PM 12/17/2021   10:30 AM  Fall Risk   Falls in the past year? 0 0  Number falls in past yr: 0 0  Injury with Fall? 0 0  Risk for fall due to : Impaired balance/gait;Impaired mobility No Fall Risks  Follow up  Falls evaluation completed      Data saved with a previous flowsheet row definition    MEDICARE RISK AT HOME: Medicare Risk at Home Any stairs in or around the home?: Yes If so, are there any without handrails?: No Home free of loose throw rugs in  walkways, pet beds, electrical cords, etc?: Yes Adequate lighting in your home to reduce risk of falls?: Yes Life alert?: No Use of a cane, walker or w/c?: Yes Grab bars in the bathroom?: Yes Shower chair or bench in shower?: Yes Elevated toilet seat or a handicapped toilet?: Yes  TIMED UP AND GO:  Was the test performed?  No    Cognitive Function:    03/02/2021   10:50 AM 07/24/2019   10:53 AM  MMSE - Mini Mental State Exam  Orientation to time 2 4  Orientation to Place 2 4  Registration 3 3  Attention/ Calculation 0 5  Recall 2 3  Language- name 2 objects 2 2  Language- repeat 1 1  Language- follow 3 step command 3 2  Language- read & follow direction 0 1  Write a sentence 0 0  Copy design 0 1  Total score 15 26        11/02/2023    1:48 PM  6CIT Screen  What Year? 0 points  What month? 0 points  What time? 3 points  Count back from 20 4 points  Months in reverse 4 points  Repeat phrase 6 points  Total Score 17 points    Immunizations Immunization History  Administered Date(s) Administered   Influenza-Unspecified 10/03/2017, 10/05/2023   Moderna Sars-Covid-2 Vaccination 02/28/2019, 03/29/2019, 11/19/2019, 11/29/2021, 10/16/2023   Tdap 09/09/2015    TDAP status: Up to date  Flu Vaccine status: Up to date  Pneumococcal vaccine status: Declined,  Education has been provided regarding the importance of this vaccine but patient still declined. Advised may receive this vaccine at local pharmacy or Health Dept. Aware to provide a copy of the vaccination record if obtained from local pharmacy or Health Dept. Verbalized acceptance and understanding.   Covid-19 vaccine status: Completed vaccines  Qualifies for Shingles Vaccine? No   Zostavax completed No     Screening Tests Health Maintenance  Topic Date Due   Pneumococcal Vaccine: 50+ Years (1 of 2 - PCV) Never done   Zoster Vaccines- Shingrix (1 of 2) Never done   COVID-19 Vaccine (6 - 2025-26 season)  12/11/2023   Medicare Annual Wellness (AWV)  07/13/2024   DTaP/Tdap/Td (2 - Td or Tdap) 09/08/2025   Influenza Vaccine  Completed   DEXA SCAN  Completed   Meningococcal B Vaccine  Aged Out    Health Maintenance  Health Maintenance Due  Topic Date Due   Pneumococcal Vaccine: 50+ Years (1 of 2 - PCV) Never done   Zoster Vaccines- Shingrix (1 of 2) Never done    Colorectal cancer screening: No longer required.   Mammogram status: No longer required due to age.    Lung Cancer Screening: (Low Dose CT Chest recommended if Age 25-80 years, 20 pack-year currently smoking OR have quit w/in 15years.) does not qualify.   Lung Cancer Screening Referral:   Additional Screening:  Hepatitis C Screening: does not qualify; Completed   Vision Screening: Recommended annual ophthalmology exams for early detection of glaucoma and other disorders of the eye. Is the patient up to date with their annual eye exam?  No  Who is the provider or what is the name of the office in which the patient attends annual eye exams?  If pt is not established with a provider, would they like to be referred to a provider to establish care? No .   Dental Screening: Recommended annual dental exams for proper oral hygiene  Diabetic Foot Exam:   Community Resource Referral / Chronic Care Management: CRR required this visit?  No   CCM required this visit?  No     Plan:     I have personally reviewed and noted the following in the patient's chart:   Medical and social history Use of alcohol , tobacco or illicit drugs  Current medications and supplements including opioid prescriptions. Patient is currently taking opioid prescriptions. Information provided to patient regarding non-opioid alternatives. Patient advised to discuss non-opioid treatment plan with their provider. Functional ability and status Nutritional status Physical activity Advanced directives List of other physicians Hospitalizations,  surgeries, and ER visits in previous 12 months Vitals Screenings to include cognitive, depression, and falls Referrals and appointments  In addition, I have reviewed and discussed with patient certain preventive protocols, quality metrics, and best practice recommendations. A written personalized care plan for preventive services as well as general preventive health recommendations were provided to patient.     Barnie GORMAN Seip, NP   11/02/2023   After Visit Summary: (In Person-Declined) Patient declined AVS at this time.  Nurse Notes: this exam has been performed by myself at this  facility

## 2023-11-07 ENCOUNTER — Non-Acute Institutional Stay (SKILLED_NURSING_FACILITY): Admitting: Adult Health

## 2023-11-07 ENCOUNTER — Encounter: Payer: Self-pay | Admitting: Adult Health

## 2023-11-07 DIAGNOSIS — G40109 Localization-related (focal) (partial) symptomatic epilepsy and epileptic syndromes with simple partial seizures, not intractable, without status epilepticus: Secondary | ICD-10-CM | POA: Diagnosis not present

## 2023-11-07 DIAGNOSIS — K219 Gastro-esophageal reflux disease without esophagitis: Secondary | ICD-10-CM | POA: Diagnosis not present

## 2023-11-07 DIAGNOSIS — M1990 Unspecified osteoarthritis, unspecified site: Secondary | ICD-10-CM

## 2023-11-07 NOTE — Progress Notes (Signed)
 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  She is doing well with her current regimen for her anxiety. There are no reports of uncontrolled pain. She does not have any heart burn.    Past Medical History:  Diagnosis Date   Anxiety    Arthritis    Cardiac arrest (HCC)    COPD (chronic obstructive pulmonary disease) (HCC)    Coronary artery disease    a. STEMI with cardiac arrest (polymorphic VT) s/p DES to RCA.   GERD (gastroesophageal reflux disease)    Headache(784.0)    Hearing loss, central    Left ear   Hematoma    Hypertension    Memory changes    Mild aortic stenosis    Mild carotid artery disease    a. 1-39% by duplex 02/2018.   Pleural effusion, right    Seizures (HCC) 10/2012   No seizures, speech issues; after a fall, hitting head on left side   Subdural hematoma (HCC) 10/18/2012   UTI (urinary tract infection)    June 2022    Past Surgical History:  Procedure Laterality Date   ABDOMINAL HYSTERECTOMY     APPENDECTOMY     CARDIAC CATHETERIZATION     15 yrs ago   CORONARY/GRAFT ACUTE MI REVASCULARIZATION N/A  01/10/2018   Procedure: Coronary/Graft Acute MI Revascularization;  Surgeon: Court Dorn PARAS, MD;  Location: [facility];  Service: Cardiovascular;  Laterality: N/A;   CRANIOTOMY Left 10/30/2012   Procedure: CRANIOTOMY HEMATOMA EVACUATION SUBDURAL;  Surgeon: Fairy Levels, MD;  Location: [facility];  Service: Neurosurgery;  Laterality: Left;  Left Craniotomy for evacuation of subdural hematoma   JOINT REPLACEMENT Bilateral    knees   LEFT HEART CATH AND CORONARY ANGIOGRAPHY N/A 01/10/2018   Procedure: LEFT HEART CATH AND CORONARY ANGIOGRAPHY;  Surgeon: Court Dorn PARAS, MD;  Location: [facility];  Service: Cardiovascular;  Laterality: N/A;    Social History   Socioeconomic History   Marital status: Married    Spouse name: Not on file   Number of children: Not on file   Years of education: Not on file   Highest education level: Not on file  Occupational History   Not on file  Tobacco Use   Smoking status: Never   Smokeless tobacco: Never  Vaping Use   Vaping status: Never Used  Substance and Sexual Activity   Alcohol  use: No   Drug use: No   Sexual activity: Not on file  Other Topics Concern  Not on file  Social History Narrative   Not on file   Social Drivers of Health   Financial Resource Strain: Low Risk  (04/24/2023)   Overall Financial Resource Strain (CARDIA)    Difficulty of Paying Living Expenses: Not hard at all  Food Insecurity: No Food Insecurity (04/24/2023)   Hunger Vital Sign    Worried About Running Out of Food in the Last Year: Never true    Ran Out of Food in the Last Year: Never true  Transportation Needs: No Transportation Needs (04/24/2023)   PRAPARE - Administrator, Civil Service (Medical): No    Lack of Transportation (Non-Medical): No  Physical Activity: Not on file  Stress: No Stress Concern Present (04/24/2023)   [employer] of Occupational Health - Occupational Stress Questionnaire    Feeling of Stress : Only a  little  Social Connections: Socially Integrated (04/24/2023)   Social Connection and Isolation Panel    Frequency of Communication with Friends and Family: Three times a week    Frequency of Social Gatherings with Friends and Family: Three times a week    Attends Religious Services: More than 4 times per year    Active Member of Clubs or Organizations: Yes    Attends Banker Meetings: More than 4 times per year    Marital Status: Married  Catering Manager Violence: Not At Risk (04/24/2023)   Humiliation, Afraid, Rape, and Kick questionnaire    Fear of Current or Ex-Partner: No    Emotionally Abused: No    Physically Abused: No    Sexually Abused: No   Family History  Problem Relation Age of Onset   Dementia Mother    Cancer Father    Dementia Maternal Aunt    Ataxia Neg Hx    Chorea Neg Hx    Mental retardation Neg Hx    Migraines Neg Hx    Multiple sclerosis Neg Hx    Neurofibromatosis Neg Hx    Neuropathy Neg Hx    Parkinsonism Neg Hx    Seizures Neg Hx    Stroke Neg Hx       VITAL SIGNS BP 138/79   Pulse 60   Temp (!) 97.2 F (36.2 C)   Resp 20   Ht 5' (1.524 m)   Wt 144 lb 6.4 oz (65.5 kg)   SpO2 96%   BMI 28.20 kg/m   Outpatient Encounter Medications as of 11/07/2023  Medication Sig   acetaminophen  (TYLENOL ) 325 MG tablet Take 650 mg by mouth every 6 (six) hours.   ALPRAZolam  (XANAX ) 0.5 MG tablet Take 1 tablet (0.5 mg total) by mouth daily at 12 noon. May also have one other time daily as needed   amLODipine  (NORVASC ) 5 MG tablet Take 5 mg by mouth daily.   aspirin  81 MG chewable tablet Chew 81 mg by mouth daily.   bisacodyl  (DULCOLAX) 10 MG suppository Place 10 mg rectally as needed for moderate constipation.   carvedilol  (COREG ) 12.5 MG tablet Take 1 tablet (12.5 mg total) by mouth 2 (two) times daily.   celecoxib  (CELEBREX ) 100 MG capsule Take 100 mg by mouth 2 (two) times daily.   cephALEXin  (KEFLEX ) 250 MG capsule Take 250 mg by mouth once.  oral, Once A Day, For UTI prophylaxis.   Cholecalciferol  25 MCG (1000 UT) TBDP Take 1,000 Units by mouth daily.   cyanocobalamin 1000 MCG tablet Take 1,000 mcg by mouth daily.   D-Mannose (AZO D-MANNOSE) 500 MG CAPS  Take 500 mg by mouth in the morning and at bedtime.   fluticasone  (FLONASE ) 50 MCG/ACT nasal spray Place 1 spray into both nostrils daily.   gabapentin  (NEURONTIN ) 100 MG capsule Take 100 mg by mouth 3 (three) times daily as needed.   HYDROcodone -acetaminophen  (NORCO) 10-325 MG tablet Take 1 tablet by mouth 2 (two) times daily.   latanoprost  (XALATAN ) 0.005 % ophthalmic solution INSTILL ONE DROP IN BOTH EYES NIGHTLY   levalbuterol (XOPENEX) 1.25 MG/3ML nebulizer solution SMARTSIG:1 Vial(s) Via Nebulizer Every 8 Hours PRN   levETIRAcetam  (KEPPRA ) 250 MG tablet Take 250 mg by mouth 2 (two) times daily.   memantine  (NAMENDA ) 5 MG tablet Take 1 tablet (5 mg total) by mouth 2 (two) times daily.   methocarbamol  (ROBAXIN ) 500 MG tablet Take 500 mg by mouth every 6 (six) hours as needed.   nitroGLYCERIN  (NITROSTAT ) 0.4 MG SL tablet Place 1 tablet (0.4 mg total) under the tongue every 5 (five) minutes x 3 doses as needed for chest pain.   omeprazole  (PRILOSEC) 40 MG capsule Take 1 capsule (40 mg total) by mouth daily.   Probiotic Product (RISA-BID PROBIOTIC) TABS Take 250 tablets by mouth daily.   risperiDONE  (RISPERDAL ) 0.5 MG tablet Take 0.5 mg by mouth at bedtime.   sennosides-docusate sodium  (SENOKOT-S) 8.6-50 MG tablet Take 1 tablet by mouth every other day.   sertraline  (ZOLOFT ) 50 MG tablet Take 1 tablet (50 mg total) by mouth daily.   [DISCONTINUED] acetaminophen  (TYLENOL ) 325 MG tablet Take 650 mg by mouth every 6 (six) hours as needed.   [DISCONTINUED] lisinopril  (ZESTRIL ) 20 MG tablet Take 1 tablet (20 mg total) by mouth 2 (two) times daily.   No facility-administered encounter medications on file as of 11/07/2023.     SIGNIFICANT DIAGNOSTIC EXAMS  TODAY  09-05-23: wbc 7.1;  hgb 10.6; hct 32.5; mcv 95.0 plt 177; glucose 100; bun 15; creat 0.57; k+ 3.8; na++ 135; ca 9.1; gfr >60 protein 6.1 albumin 3.3   Review of Systems  Constitutional:  Negative for malaise/fatigue.  Respiratory:  Negative for cough and shortness of breath.   Cardiovascular:  Negative for chest pain, palpitations and leg swelling.  Gastrointestinal:  Negative for abdominal pain, constipation and heartburn.  Musculoskeletal:  Negative for back pain, joint pain and myalgias.  Skin: Negative.   Neurological:  Negative for dizziness.  Psychiatric/Behavioral:  The patient is not nervous/anxious.     Physical Exam Constitutional:      General: She is not in acute distress.    Appearance: She is well-developed. She is not diaphoretic.  Neck:     Thyroid : No thyromegaly.  Cardiovascular:     Rate and Rhythm: Normal rate and regular rhythm.     Pulses: Normal pulses.     Heart sounds: Murmur heard.  Pulmonary:     Effort: Pulmonary effort is normal. No respiratory distress.     Breath sounds: Normal breath sounds.  Abdominal:     General: Bowel sounds are normal. There is no distension.     Palpations: Abdomen is soft.     Tenderness: There is no abdominal tenderness.  Musculoskeletal:     Cervical back: Neck supple.     Right lower leg: No edema.     Left lower leg: No edema.     Comments: Significant kyphosis and neck contracture  Lymphadenopathy:     Cervical: No cervical adenopathy.  Skin:    General: Skin is warm and dry.  Neurological:     Mental  Status: She is alert. Mental status is at baseline.  Psychiatric:        Mood and Affect: Mood normal.       ASSESSMENT/ PLAN:  TODAY  GERD without esophagitis: will continue prilosec 40 mg daily   2. 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 Arthritis: will continue celebrex  100 mg twice daily and  vicodin 10/325 mg twice daily has gabapentin  100 mg three times daily as needed for neuropathic pain; and has robaxin  500 mg every 6 hours as needed.    PREVIOUS   4. Primary open angle glaucoma both eyes stage mild: will continue xalatan  to both eyes nightly  5. Mild protein calorie malnutrition: will continue ensure daily   6. MCI (Mild cognitive impairment): will continue namenda  5 mg twice daily   7. Major depression recurrent chronic: will continue Risperdal  0.5 mg nightly  and zoloft  50 mg daily; xanax  0.5 mg daily at noon and daily as needed    8.  Chronic UTI: will continue keflex  250 mg daily and d-mannose twice daily   9. Coronary artery disease involving native coronary artery of native with angina pectoris: has ntg prn.   10. Aortic atherosclerosis (ct 05-08-20) is on asa; not on statin due to advanced age  64.  Primary hypertension: b/p 128/76: will continue asa 81 mg daily; norvasc  5 mg daily coreg  12.5 mg twice daily   12. Chronic bronchitis unspecified: will continue flonase  daily; xopenex 1.25 mg every 8 hours as needed     Barnie Seip NP [facility]  call [phone]

## 2023-11-14 ENCOUNTER — Other Ambulatory Visit: Payer: Self-pay | Admitting: Adult Health

## 2023-11-14 DIAGNOSIS — G8929 Other chronic pain: Secondary | ICD-10-CM

## 2023-11-14 MED ORDER — HYDROCODONE-ACETAMINOPHEN 10-325 MG PO TABS: 1.0000 | ORAL_TABLET | Freq: Two times a day (BID) | ORAL | 0 refills | Status: DC

## 2023-11-22 ENCOUNTER — Other Ambulatory Visit: Payer: Self-pay | Admitting: Adult Health

## 2023-11-22 MED ORDER — ALPRAZOLAM 0.5 MG PO TABS: 0.5000 mg | ORAL_TABLET | Freq: Every day | ORAL | 0 refills | Status: DC

## 2023-12-12 ENCOUNTER — Other Ambulatory Visit: Payer: Self-pay | Admitting: Adult Health

## 2023-12-12 DIAGNOSIS — G8929 Other chronic pain: Secondary | ICD-10-CM

## 2023-12-12 MED ORDER — HYDROCODONE-ACETAMINOPHEN 10-325 MG PO TABS: 1.0000 | ORAL_TABLET | Freq: Two times a day (BID) | ORAL | 0 refills | Status: DC

## 2023-12-12 MED ORDER — ALPRAZOLAM 0.5 MG PO TABS: 0.5000 mg | ORAL_TABLET | Freq: Every day | ORAL | 0 refills | Status: DC

## 2023-12-13 ENCOUNTER — Non-Acute Institutional Stay: Payer: Self-pay | Admitting: Adult Health

## 2023-12-13 ENCOUNTER — Encounter: Payer: Self-pay | Admitting: Adult Health

## 2023-12-13 DIAGNOSIS — H401131 Primary open-angle glaucoma, bilateral, mild stage: Secondary | ICD-10-CM

## 2023-12-13 DIAGNOSIS — G3184 Mild cognitive impairment, so stated: Secondary | ICD-10-CM

## 2023-12-13 DIAGNOSIS — E441 Mild protein-calorie malnutrition: Secondary | ICD-10-CM

## 2023-12-13 NOTE — Progress Notes (Signed)
 Location:  [facility] Room Number: 76 D Place of Service:  SNF (31)   CODE STATUS: DNR   Allergies  Allergen Reactions   Lisinopril  Swelling   Egg Protein-Containing Drug Products Itching    Patient reports that she can eat eggs but does not tolerate egg derived products   Morphine  And Codeine     Headache   Penicillins Itching         Chief Complaint  Patient presents with   Medical Management of Chronic Issues                 Primary open angle glaucoma both eyes stage mild:  Mild protein calorie malnutrition:  MCI (mild cognitive impairment)    HPI:  She is a 89 y.o. long term resident of this facility being seen for the management of her chronic illnesses:  Primary open angle glaucoma both eyes stage mild:  Mild protein calorie malnutrition:  MCI (mild cognitive impairment). There are no reports of uncontrolled pain. Her anxiety is presently being managed with her current regimen. Her weight remains stable.    Past Medical History:  Diagnosis Date   Anxiety    Arthritis    Cardiac arrest (HCC)    COPD (chronic obstructive pulmonary disease) (HCC)    Coronary artery disease    a. STEMI with cardiac arrest (polymorphic VT) s/p DES to RCA.   GERD (gastroesophageal reflux disease)    Headache(784.0)    Hearing loss, central    Left ear   Hematoma    Hypertension    Memory changes    Mild aortic stenosis    Mild carotid artery disease    a. 1-39% by duplex 02/2018.   Pleural effusion, right    Seizures (HCC) 10/2012   No seizures, speech issues; after a fall, hitting head on left side   Subdural hematoma (HCC) 10/18/2012   UTI (urinary tract infection)    June 2022    Past Surgical History:  Procedure Laterality Date   ABDOMINAL HYSTERECTOMY     APPENDECTOMY     CARDIAC CATHETERIZATION     15 yrs ago   CORONARY/GRAFT ACUTE MI REVASCULARIZATION N/A 01/10/2018   Procedure: Coronary/Graft Acute MI Revascularization;  Surgeon: Court Dorn PARAS, MD;  Location: [facility];  Service: Cardiovascular;  Laterality: N/A;   CRANIOTOMY Left 10/30/2012   Procedure: CRANIOTOMY HEMATOMA EVACUATION SUBDURAL;  Surgeon: Fairy Levels, MD;  Location: [facility];  Service: Neurosurgery;  Laterality: Left;  Left Craniotomy for evacuation of subdural hematoma   JOINT REPLACEMENT Bilateral    knees   LEFT HEART CATH AND CORONARY ANGIOGRAPHY N/A 01/10/2018   Procedure: LEFT HEART CATH AND CORONARY ANGIOGRAPHY;  Surgeon: Court Dorn PARAS, MD;  Location: [facility];  Service: Cardiovascular;  Laterality: N/A;    Social History   Socioeconomic History   Marital status: Married    Spouse name: Not on file   Number of children: Not on file   Years of education: Not on file   Highest education level: Not on file  Occupational History   Not on file  Tobacco Use   Smoking status: Never   Smokeless tobacco: Never  Vaping Use   Vaping status: Never Used  Substance and Sexual Activity   Alcohol  use: No   Drug use: No   Sexual activity: Not on file  Other Topics Concern   Not on file  Social History Narrative   Not on  file   Social Drivers of Health   Financial Resource Strain: Low Risk  (04/24/2023)   Overall Financial Resource Strain (CARDIA)    Difficulty of Paying Living Expenses: Not hard at all  Food Insecurity: No Food Insecurity (04/24/2023)   Hunger Vital Sign    Worried About Running Out of Food in the Last Year: Never true    Ran Out of Food in the Last Year: Never true  Transportation Needs: No Transportation Needs (04/24/2023)   PRAPARE - Administrator, Civil Service (Medical): No    Lack of Transportation (Non-Medical): No  Physical Activity: Not on file  Stress: No Stress Concern Present (04/24/2023)   [employer] of Occupational Health - Occupational Stress Questionnaire    Feeling of Stress : Only a little  Social Connections: Socially Integrated (04/24/2023)   Social Connection  and Isolation Panel    Frequency of Communication with Friends and Family: Three times a week    Frequency of Social Gatherings with Friends and Family: Three times a week    Attends Religious Services: More than 4 times per year    Active Member of Clubs or Organizations: Yes    Attends Banker Meetings: More than 4 times per year    Marital Status: Married  Catering Manager Violence: Not At Risk (04/24/2023)   Humiliation, Afraid, Rape, and Kick questionnaire    Fear of Current or Ex-Partner: No    Emotionally Abused: No    Physically Abused: No    Sexually Abused: No   Family History  Problem Relation Age of Onset   Dementia Mother    Cancer Father    Dementia Maternal Aunt    Ataxia Neg Hx    Chorea Neg Hx    Mental retardation Neg Hx    Migraines Neg Hx    Multiple sclerosis Neg Hx    Neurofibromatosis Neg Hx    Neuropathy Neg Hx    Parkinsonism Neg Hx    Seizures Neg Hx    Stroke Neg Hx       VITAL SIGNS BP 123/68   Pulse 69   Temp (!) 97.1 F (36.2 C)   Resp 20   Ht 5' (1.524 m)   Wt 144 lb 6.4 oz (65.5 kg)   SpO2 97%   BMI 28.20 kg/m   Outpatient Encounter Medications as of 12/13/2023  Medication Sig   acetaminophen  (TYLENOL ) 325 MG tablet Take 650 mg by mouth every 6 (six) hours.   ALPRAZolam  (XANAX ) 0.5 MG tablet Take 1 tablet (0.5 mg total) by mouth daily at 12 noon. May also have one other time daily as needed   amLODipine  (NORVASC ) 5 MG tablet Take 5 mg by mouth daily.   aspirin  81 MG chewable tablet Chew 81 mg by mouth daily.   bisacodyl  (DULCOLAX) 10 MG suppository Place 10 mg rectally as needed for moderate constipation.   carvedilol  (COREG ) 12.5 MG tablet Take 1 tablet (12.5 mg total) by mouth 2 (two) times daily.   celecoxib  (CELEBREX ) 100 MG capsule Take 100 mg by mouth 2 (two) times daily.   cephALEXin  (KEFLEX ) 250 MG capsule Take 250 mg by mouth once. oral, Once A Day, For UTI prophylaxis.   Cholecalciferol  25 MCG (1000 UT) TBDP  Take 1,000 Units by mouth daily.   cyanocobalamin 1000 MCG tablet Take 1,000 mcg by mouth daily.   D-Mannose (AZO D-MANNOSE) 500 MG CAPS Take 500 mg by mouth in the morning and at bedtime.  fluticasone  (FLONASE ) 50 MCG/ACT nasal spray Place 1 spray into both nostrils daily.   gabapentin  (NEURONTIN ) 100 MG capsule Take 100 mg by mouth 3 (three) times daily as needed.   HYDROcodone -acetaminophen  (NORCO) 10-325 MG tablet Take 1 tablet by mouth 2 (two) times daily.   latanoprost  (XALATAN ) 0.005 % ophthalmic solution INSTILL ONE DROP IN BOTH EYES NIGHTLY   levalbuterol (XOPENEX) 1.25 MG/3ML nebulizer solution SMARTSIG:1 Vial(s) Via Nebulizer Every 8 Hours PRN   levETIRAcetam  (KEPPRA ) 250 MG tablet Take 250 mg by mouth 2 (two) times daily.   memantine  (NAMENDA ) 5 MG tablet Take 1 tablet (5 mg total) by mouth 2 (two) times daily.   methocarbamol  (ROBAXIN ) 500 MG tablet Take 500 mg by mouth every 6 (six) hours as needed.   nitroGLYCERIN  (NITROSTAT ) 0.4 MG SL tablet Place 1 tablet (0.4 mg total) under the tongue every 5 (five) minutes x 3 doses as needed for chest pain.   omeprazole  (PRILOSEC) 40 MG capsule Take 1 capsule (40 mg total) by mouth daily.   Probiotic Product (RISA-BID PROBIOTIC) TABS Take 250 tablets by mouth daily.   risperiDONE  (RISPERDAL ) 0.5 MG tablet Take 0.5 mg by mouth at bedtime.   sennosides-docusate sodium  (SENOKOT-S) 8.6-50 MG tablet Take 1 tablet by mouth once. tablet; 8.6-50 mg; amt: 1 tab; oral Special Instructions: for constipation Once A Day 08:00 AM   sertraline  (ZOLOFT ) 50 MG tablet Take 1 tablet (50 mg total) by mouth daily.   [DISCONTINUED] lisinopril  (ZESTRIL ) 20 MG tablet Take 1 tablet (20 mg total) by mouth 2 (two) times daily.   No facility-administered encounter medications on file as of 12/13/2023.     SIGNIFICANT DIAGNOSTIC EXAMS  TODAY  09-05-23: wbc 7.1; hgb 10.6; hct 32.5; mcv 95.0 plt 177; glucose 100; bun 15; creat 0.57; k+ 3.8; na++ 135; ca 9.1; gfr  >60 protein 6.1 albumin 3.3   Review of Systems  Constitutional:  Negative for malaise/fatigue.  Respiratory:  Negative for cough and shortness of breath.   Cardiovascular:  Negative for chest pain, palpitations and leg swelling.  Gastrointestinal:  Negative for abdominal pain, constipation and heartburn.  Musculoskeletal:  Negative for back pain, joint pain and myalgias.  Skin: Negative.   Neurological:  Negative for dizziness.  Psychiatric/Behavioral:  The patient is not nervous/anxious.     Physical Exam Constitutional:      General: She is not in acute distress.    Appearance: She is well-developed. She is not diaphoretic.  Neck:     Thyroid : No thyromegaly.  Cardiovascular:     Rate and Rhythm: Normal rate and regular rhythm.     Pulses: Normal pulses.     Heart sounds: Murmur heard.  Pulmonary:     Effort: Pulmonary effort is normal. No respiratory distress.     Breath sounds: Normal breath sounds.  Abdominal:     General: Bowel sounds are normal. There is no distension.     Palpations: Abdomen is soft.     Tenderness: There is no abdominal tenderness.  Musculoskeletal:     Cervical back: Neck supple.     Right lower leg: No edema.     Left lower leg: No edema.     Comments: Significant kyphosis and neck contracture   Lymphadenopathy:     Cervical: No cervical adenopathy.  Skin:    General: Skin is warm and dry.  Neurological:     Mental Status: She is alert. Mental status is at baseline.  Psychiatric:  Mood and Affect: Mood normal.        ASSESSMENT/ PLAN:  TODAY  Primary open angle glaucoma both eyes stage mild: will continue xalatan  to both eyes nightly   2. Mild protein calorie malnutrition: will continue supplements as directed  3. MCI (mild cognitive impairment): is on namenda  5 mg twice daily BCAT 24/50   PREVIOUS   4. Major depression recurrent chronic: will continue Risperdal  0.5 mg nightly  and zoloft  50 mg daily; xanax  0.5 mg daily at  noon and daily as needed    5.  Chronic UTI: will continue keflex  250 mg daily and d-mannose twice daily   6. Coronary artery disease involving native coronary artery of native with angina pectoris: has ntg prn.   7. Aortic atherosclerosis (ct 05-08-20) is on asa; not on statin due to advanced age  71.  Primary hypertension: b/p 123/68: will continue asa 81 mg daily; norvasc  5 mg daily coreg  12.5 mg twice daily   9. Chronic bronchitis unspecified: will continue flonase  daily; xopenex 1.25 mg every 8 hours as needed   10. GERD without esophagitis: will continue prilosec 40 mg daily   11. 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 Arthritis: will continue celebrex  100 mg twice daily and vicodin 10/325 mg twice daily has gabapentin  100 mg three times daily as needed for neuropathic pain; and has robaxin  500 mg every 6 hours as needed.     Barnie Seip NP [facility]  call [phone]

## 2023-12-15 ENCOUNTER — Encounter: Payer: Self-pay | Admitting: Adult Health

## 2023-12-15 ENCOUNTER — Non-Acute Institutional Stay (SKILLED_NURSING_FACILITY): Admitting: Adult Health

## 2023-12-15 DIAGNOSIS — J42 Unspecified chronic bronchitis: Secondary | ICD-10-CM | POA: Diagnosis not present

## 2023-12-15 DIAGNOSIS — I700 Atherosclerosis of aorta: Secondary | ICD-10-CM

## 2023-12-15 DIAGNOSIS — G3184 Mild cognitive impairment, so stated: Secondary | ICD-10-CM

## 2023-12-15 NOTE — Progress Notes (Signed)
 Location:  [facility] Room Number: 118 Place of Service:  SNF (31)   CODE STATUS: dnr   Allergies[1]  Chief Complaint  Patient presents with   Acute Visit    Care plan meeting     HPI:  We have come together for her care plan meeting. Family present.  BIMS 15/15 mood 6/30: restless and some depression. She is out of bed to wheelchair without falls. She requires independent to moderate assist with her adl care. She is occasionally incontinent of bladder and continent of bowel. Dietary: setup for meals; regular diet; appetite 51-100% weight is 144.4 pounds. Therapy: none at this time. Activities: does participate. She will continue to be followed for her chronic illnesses including: Aortic atherosclerosis    MCI (mild cognitive impairment)   Chronic bronchitis, unspecified bronchitis type  Past Medical History:  Diagnosis Date   Anxiety    Arthritis    Cardiac arrest (HCC)    COPD (chronic obstructive pulmonary disease) (HCC)    Coronary artery disease    a. STEMI with cardiac arrest (polymorphic VT) s/p DES to RCA.   GERD (gastroesophageal reflux disease)    Headache(784.0)    Hearing loss, central    Left ear   Hematoma    Hypertension    Memory changes    Mild aortic stenosis    Mild carotid artery disease    a. 1-39% by duplex 02/2018.   Pleural effusion, right    Seizures (HCC) 10/2012   No seizures, speech issues; after a fall, hitting head on left side   Subdural hematoma (HCC) 10/18/2012   UTI (urinary tract infection)    June 2022    Past Surgical History:  Procedure Laterality Date   ABDOMINAL HYSTERECTOMY     APPENDECTOMY     CARDIAC CATHETERIZATION     15 yrs ago   CORONARY/GRAFT ACUTE MI REVASCULARIZATION N/A 01/10/2018   Procedure: Coronary/Graft Acute MI Revascularization;  Surgeon: Court Dorn PARAS, MD;  Location: [facility] INVASIVE CV LAB;  Service: Cardiovascular;  Laterality: N/A;   CRANIOTOMY Left 10/30/2012   Procedure:  CRANIOTOMY HEMATOMA EVACUATION SUBDURAL;  Surgeon: Fairy Levels, MD;  Location: [facility] NEURO ORS;  Service: Neurosurgery;  Laterality: Left;  Left Craniotomy for evacuation of subdural hematoma   JOINT REPLACEMENT Bilateral    knees   LEFT HEART CATH AND CORONARY ANGIOGRAPHY N/A 01/10/2018   Procedure: LEFT HEART CATH AND CORONARY ANGIOGRAPHY;  Surgeon: Court Dorn PARAS, MD;  Location: [facility] INVASIVE CV LAB;  Service: Cardiovascular;  Laterality: N/A;    Social History   Socioeconomic History   Marital status: Married    Spouse name: Not on file   Number of children: Not on file   Years of education: Not on file   Highest education level: Not on file  Occupational History   Not on file  Tobacco Use   Smoking status: Never   Smokeless tobacco: Never  Vaping Use   Vaping status: Never Used  Substance and Sexual Activity   Alcohol  use: No   Drug use: No   Sexual activity: Not on file  Other Topics Concern   Not on file  Social History Narrative   Not on file   Social Drivers of Health   Tobacco Use: Low Risk (12/15/2023)   Patient History    Smoking Tobacco Use: Never    Smokeless Tobacco Use: Never    Passive Exposure: Not on file  Financial Resource Strain: Low Risk (04/24/2023)  Overall Financial Resource Strain (CARDIA)    Difficulty of Paying Living Expenses: Not hard at all  Food Insecurity: No Food Insecurity (04/24/2023)   Hunger Vital Sign    Worried About Running Out of Food in the Last Year: Never true    Ran Out of Food in the Last Year: Never true  Transportation Needs: No Transportation Needs (04/24/2023)   PRAPARE - Administrator, Civil Service (Medical): No    Lack of Transportation (Non-Medical): No  Physical Activity: Not on file  Stress: No Stress Concern Present (04/24/2023)   [employer] of Occupational Health - Occupational Stress Questionnaire    Feeling of Stress : Only a little  Social Connections: Socially Integrated (04/24/2023)    Social Connection and Isolation Panel    Frequency of Communication with Friends and Family: Three times a week    Frequency of Social Gatherings with Friends and Family: Three times a week    Attends Religious Services: More than 4 times per year    Active Member of Clubs or Organizations: Yes    Attends Banker Meetings: More than 4 times per year    Marital Status: Married  Catering Manager Violence: Not At Risk (04/24/2023)   Humiliation, Afraid, Rape, and Kick questionnaire    Fear of Current or Ex-Partner: No    Emotionally Abused: No    Physically Abused: No    Sexually Abused: No  Depression (PHQ2-9): Low Risk (11/02/2023)   Depression (PHQ2-9)    PHQ-2 Score: 0  Alcohol  Screen: Not on file  Housing: Low Risk (04/24/2023)   Housing Stability Vital Sign    Unable to Pay for Housing in the Last Year: No    Number of Times Moved in the Last Year: 0    Homeless in the Last Year: No  Utilities: Not At Risk (04/24/2023)   [facility] Utilities    Threatened with loss of utilities: No  Health Literacy: Adequate Health Literacy (04/24/2023)   B1300 Health Literacy    Frequency of need for help with medical instructions: Never   Family History  Problem Relation Age of Onset   Dementia Mother    Cancer Father    Dementia Maternal Aunt    Ataxia Neg Hx    Chorea Neg Hx    Mental retardation Neg Hx    Migraines Neg Hx    Multiple sclerosis Neg Hx    Neurofibromatosis Neg Hx    Neuropathy Neg Hx    Parkinsonism Neg Hx    Seizures Neg Hx    Stroke Neg Hx       VITAL SIGNS BP 123/68   Pulse 61   Temp (!) 97.1 F (36.2 C)   Resp 20   Ht 5' (1.524 m)   Wt 144 lb 6.4 oz (65.5 kg)   SpO2 95%   BMI 28.20 kg/m   Outpatient Encounter Medications as of 12/15/2023  Medication Sig   acetaminophen  (TYLENOL ) 325 MG tablet Take 650 mg by mouth every 6 (six) hours.   ALPRAZolam  (XANAX ) 0.5 MG tablet Take 1 tablet (0.5 mg total) by mouth daily at 12 noon. May also have one  other time daily as needed   amLODipine  (NORVASC ) 5 MG tablet Take 5 mg by mouth daily.   aspirin  81 MG chewable tablet Chew 81 mg by mouth daily.   bisacodyl  (DULCOLAX) 10 MG suppository Place 10 mg rectally as needed for moderate constipation.   carvedilol  (COREG ) 12.5 MG tablet Take  1 tablet (12.5 mg total) by mouth 2 (two) times daily.   celecoxib  (CELEBREX ) 100 MG capsule Take 100 mg by mouth 2 (two) times daily.   cephALEXin  (KEFLEX ) 250 MG capsule Take 250 mg by mouth once. oral, Once A Day, For UTI prophylaxis.   Cholecalciferol  25 MCG (1000 UT) TBDP Take 1,000 Units by mouth daily.   cyanocobalamin 1000 MCG tablet Take 1,000 mcg by mouth daily.   D-Mannose (AZO D-MANNOSE) 500 MG CAPS Take 500 mg by mouth in the morning and at bedtime.   fluticasone  (FLONASE ) 50 MCG/ACT nasal spray Place 1 spray into both nostrils daily.   gabapentin  (NEURONTIN ) 100 MG capsule Take 100 mg by mouth 3 (three) times daily as needed.   HYDROcodone -acetaminophen  (NORCO) 10-325 MG tablet Take 1 tablet by mouth 2 (two) times daily.   latanoprost  (XALATAN ) 0.005 % ophthalmic solution INSTILL ONE DROP IN BOTH EYES NIGHTLY   levalbuterol (XOPENEX) 1.25 MG/3ML nebulizer solution SMARTSIG:1 Vial(s) Via Nebulizer Every 8 Hours PRN   levETIRAcetam  (KEPPRA ) 250 MG tablet Take 250 mg by mouth 2 (two) times daily.   memantine  (NAMENDA ) 5 MG tablet Take 1 tablet (5 mg total) by mouth 2 (two) times daily.   methocarbamol  (ROBAXIN ) 500 MG tablet Take 500 mg by mouth every 6 (six) hours as needed.   nitroGLYCERIN  (NITROSTAT ) 0.4 MG SL tablet Place 1 tablet (0.4 mg total) under the tongue every 5 (five) minutes x 3 doses as needed for chest pain.   omeprazole  (PRILOSEC) 40 MG capsule Take 1 capsule (40 mg total) by mouth daily.   Probiotic Product (RISA-BID PROBIOTIC) TABS Take 250 tablets by mouth daily.   risperiDONE  (RISPERDAL ) 0.5 MG tablet Take 0.5 mg by mouth at bedtime.   sennosides-docusate sodium  (SENOKOT-S) 8.6-50  MG tablet Take 1 tablet by mouth once. tablet; 8.6-50 mg; amt: 1 tab; oral Special Instructions: for constipation Once A Day 08:00 AM   sertraline  (ZOLOFT ) 50 MG tablet Take 1 tablet (50 mg total) by mouth daily.   [DISCONTINUED] lisinopril  (ZESTRIL ) 20 MG tablet Take 1 tablet (20 mg total) by mouth 2 (two) times daily.   No facility-administered encounter medications on file as of 12/15/2023.     SIGNIFICANT DIAGNOSTIC EXAMS  TODAY  09-05-23: wbc 7.1; hgb 10.6; hct 32.5; mcv 95.0 plt 177; glucose 100; bun 15; creat 0.57; k+ 3.8; na++ 135; ca 9.1; gfr >60 protein 6.1 albumin 3.3   Review of Systems  Constitutional:  Negative for malaise/fatigue.  Respiratory:  Negative for cough and shortness of breath.   Cardiovascular:  Negative for chest pain, palpitations and leg swelling.  Gastrointestinal:  Negative for abdominal pain, constipation and heartburn.  Musculoskeletal:  Negative for back pain, joint pain and myalgias.  Skin: Negative.   Neurological:  Negative for dizziness.  Psychiatric/Behavioral:  The patient is not nervous/anxious.    Physical Exam Constitutional:      General: She is not in acute distress.    Appearance: She is well-developed. She is not diaphoretic.  Neck:     Thyroid : No thyromegaly.  Cardiovascular:     Rate and Rhythm: Normal rate and regular rhythm.     Pulses: Normal pulses.     Heart sounds: Murmur heard.  Pulmonary:     Effort: Pulmonary effort is normal. No respiratory distress.     Breath sounds: Normal breath sounds.  Abdominal:     General: Bowel sounds are normal. There is no distension.     Palpations: Abdomen is soft.  Tenderness: There is no abdominal tenderness.  Musculoskeletal:     Cervical back: Neck supple.     Right lower leg: No edema.     Left lower leg: No edema.     Comments:  Significant kyphosis and neck contracture  Lymphadenopathy:     Cervical: No cervical adenopathy.  Skin:    General: Skin is warm and dry.   Neurological:     Mental Status: She is alert. Mental status is at baseline.  Psychiatric:        Mood and Affect: Mood normal.      ASSESSMENT/ PLAN:  TODAY  Aortic atherosclerosis MCI (mild cognitive impairment) Chronic bronchitis, unspecified bronchitis type  Will continue current medications Will continue current plan of care Will continue to monitor her status.   Time spent with patient: 40 minutes: medications; adl care; dietary.    Barnie Seip NP [facility]  call [phone]      [1]  Allergies Allergen Reactions   Lisinopril  Swelling   Egg Protein-Containing Drug Products Itching    Patient reports that she can eat eggs but does not tolerate egg derived products   Morphine  And Codeine     Headache   Penicillins Itching

## 2024-01-12 ENCOUNTER — Encounter: Payer: Self-pay | Admitting: Internal Medicine

## 2024-01-12 ENCOUNTER — Non-Acute Institutional Stay (SKILLED_NURSING_FACILITY): Admitting: Internal Medicine

## 2024-01-12 DIAGNOSIS — D638 Anemia in other chronic diseases classified elsewhere: Secondary | ICD-10-CM | POA: Diagnosis not present

## 2024-01-12 DIAGNOSIS — E441 Mild protein-calorie malnutrition: Secondary | ICD-10-CM

## 2024-01-12 DIAGNOSIS — K5909 Other constipation: Secondary | ICD-10-CM | POA: Diagnosis not present

## 2024-01-12 DIAGNOSIS — D649 Anemia, unspecified: Secondary | ICD-10-CM | POA: Diagnosis not present

## 2024-01-12 DIAGNOSIS — I10 Essential (primary) hypertension: Secondary | ICD-10-CM | POA: Diagnosis not present

## 2024-01-12 NOTE — Assessment & Plan Note (Signed)
 BP controlled; no change in antihypertensive medications

## 2024-01-12 NOTE — Assessment & Plan Note (Signed)
 Update TP & albumin  to assess current status.

## 2024-01-12 NOTE — Patient Instructions (Signed)
 See assessment and plan under each diagnosis in the problem list and acutely for this visit

## 2024-01-12 NOTE — Assessment & Plan Note (Signed)
 Serially the TSH was rising from 0.94 up to 4.007 as of 03/01/2023.  TSH will be updated to rule out hypothyroidism as a factor in her constipation.  Senokot will be increased to twice daily.

## 2024-01-12 NOTE — Progress Notes (Unsigned)
 "   NURSING HOME LOCATION:  [facility] ROOM NUMBER:  [id number]  CODE STATUS:  DNR  PCP: Shona Norleen PEDLAR, MD   This is a nursing facility follow up visit of chronic medical diagnoses to document compliance with Regulation 483.30 (c) in The Long Term Care Survey Manual Phase 2 which mandates caregiver visit ( visits can alternate among physician, PA or NP as per statutes) within 10 days of 30 days / 60 days/ 90 days post admission to SNF date  .  Interim medical record and care since last SNF visit was updated with review of diagnostic studies and change in clinical status since last visit were documented.  HPI: She is a permanent resident of this facility with history of oxygen dependent COPD; CAD, s/p cardiac arrest; essential hypertension; history of subdural hematoma; and history of seizures.  There has been slight hyperglycemia on serial glucose determinations with the range of 96-133.  There is no A1c on record since 2014.  Most recent total protein was 6.1 and albumin 3.3 compatible with protein/caloric malnutrition.  There have been slight progression of normochromic, normocytic anemia with current H/H of 10.6/32.5.  She is not on thyroid  supplement.  TSH was last checked 03/01/2023; value was 4.007, up from a prior value of 0.924.  Review of systems: Her major complaint is that she stays constipated.  She states it does respond to as needed medication which is listed as Dulcolax.  She is also on Senokot daily.  She describes pain in her neck and low back.  She occasionally has dysphagia.  She denies any bleeding dyscrasias despite the anemia.  She states that her appetite is normal and weight essentially the same.  She has numbness and tingling in her fingers.  She describes nocturia 4-5 times per night.  Because of that she had begun drinking less fluids after 3 PM without significant response.  Constitutional: No fever, significant weight change, fatigue  Eyes: No redness,  discharge, pain, vision change ENT/mouth: No nasal congestion,  purulent discharge, earache, change in hearing, sore throat  Cardiovascular: No chest pain, palpitations, paroxysmal nocturnal dyspnea, claudication, edema  Respiratory: No cough, sputum production, hemoptysis, DOE, significant snoring, apnea   Gastrointestinal: No heartburn, dysphagia, abdominal pain, nausea /vomiting, rectal bleeding, melena, change in bowels Genitourinary: No dysuria, hematuria, pyuria, incontinence, nocturia Musculoskeletal: No joint stiffness, joint swelling, weakness, pain Dermatologic: No rash, pruritus, change in appearance of skin Neurologic: No dizziness, headache, syncope, seizures, numbness, tingling Psychiatric: No significant anxiety, depression, insomnia, anorexia Endocrine: No change in hair/skin/nails, excessive thirst, excessive hunger, excessive urination  Hematologic/lymphatic: No significant bruising, lymphadenopathy, abnormal bleeding Allergy/immunology: No itchy/watery eyes, significant sneezing, urticaria, angioedema  Physical exam:  Pertinent or positive findings: She appears her age.  The most striking physical finding is torticollis with a neck deviated to the left.  She is essentially deaf and leans close during the interview.  There is minimal anisocoria suggested with the left pupil slightly larger than the right.  She is wearing nasal oxygen.  Heart sounds are distant; she has a grade 1 have systolic murmur.  She has minimal rales in the right mid posterior thorax.  Breath sounds tend to be decreased at the bases.  Abdomen is protuberant.  Pedal pulses are not palpable.  She has interosseous wasting of the hands.  Osteoarthritic changes of the hands are present.  There is a very prominent venous pattern over the forearms and hands.   Right lateral more so that  she ensures that he had run with it.  She has needed it all right to get those General appearance: Adequately nourished; no acute  distress, increased work of breathing is present.   Lymphatic: No lymphadenopathy about the head, neck, axilla. Eyes: No conjunctival inflammation or lid edema is present. There is no scleral icterus. Ears:  External ear exam shows no significant lesions or deformities.   Nose:  External nasal examination shows no deformity or inflammation. Nasal mucosa are pink and moist without lesions, exudates Oral exam:  Lips and gums are healthy appearing. There is no oropharyngeal erythema or exudate. Neck:  No thyromegaly, masses, tenderness noted.    Heart:  Normal rate and regular rhythm. S1 and S2 normal without gallop, murmur, click, rub .  Lungs: Chest clear to auscultation without wheezes, rhonchi, rales, rubs. Abdomen: Bowel sounds are normal. Abdomen is soft and nontender with no organomegaly, hernias, masses. GU: Deferred  Extremities:  No cyanosis, clubbing, edema  Neurologic exam : Cn 2-7 intact Strength equal  in upper & lower extremities Balance, Rhomberg, finger to nose testing could not be completed due to clinical state Deep tendon reflexes are equal Skin: Warm & dry w/o tenting. No significant lesions or rash.  See summary under each active problem in the Problem List with associated updated therapeutic plan  "

## 2024-01-12 NOTE — Assessment & Plan Note (Signed)
 Update CBC & iron panel. She remains on B12 supplement.

## 2024-01-15 ENCOUNTER — Other Ambulatory Visit: Payer: Self-pay | Admitting: Adult Health

## 2024-01-15 ENCOUNTER — Other Ambulatory Visit (HOSPITAL_COMMUNITY)
Admission: RE | Admit: 2024-01-15 | Discharge: 2024-01-15 | Disposition: A | Discharge status: Home / Self Care | Source: Skilled Nursing Facility | Attending: Internal Medicine | Admitting: Internal Medicine

## 2024-01-15 DIAGNOSIS — R300 Dysuria: Secondary | ICD-10-CM | POA: Diagnosis present

## 2024-01-15 DIAGNOSIS — G8929 Other chronic pain: Secondary | ICD-10-CM

## 2024-01-15 LAB — URINALYSIS, ROUTINE W REFLEX MICROSCOPIC
Bilirubin Urine: NEGATIVE
Glucose, UA: NEGATIVE mg/dL
Hgb urine dipstick: NEGATIVE
Ketones, ur: NEGATIVE mg/dL
Nitrite: NEGATIVE
Protein, ur: NEGATIVE mg/dL
Specific Gravity, Urine: 1.01 (ref 1.005–1.030)
WBC, UA: >50 WBC/hpf (ref 0–5)
pH: 6 (ref 5.0–8.0)

## 2024-01-15 MED ORDER — ALPRAZOLAM 0.5 MG PO TABS: 0.5000 mg | ORAL_TABLET | Freq: Every day | ORAL | 0 refills | Status: AC

## 2024-01-15 MED ORDER — HYDROCODONE-ACETAMINOPHEN 10-325 MG PO TABS: 1.0000 | ORAL_TABLET | Freq: Two times a day (BID) | ORAL | 0 refills | Status: AC

## 2024-01-18 ENCOUNTER — Encounter: Payer: Self-pay | Admitting: Adult Health

## 2024-01-18 ENCOUNTER — Non-Acute Institutional Stay (SKILLED_NURSING_FACILITY): Payer: Self-pay | Admitting: Adult Health

## 2024-01-18 DIAGNOSIS — N390 Urinary tract infection, site not specified: Secondary | ICD-10-CM

## 2024-01-18 LAB — URINE CULTURE: Culture: >=100000 — AB

## 2024-01-18 NOTE — Progress Notes (Signed)
 " Location:  [facility] Room Number: 118 Place of Service:  SNF (31)   CODE STATUS: dnr   Allergies[1]  Chief Complaint  Patient presents with   Acute Visit    UTI    HPI:  She has been complaining of dysuria; abdominal and back pain. There are no reports of fevers present. No changes in appetite. She had a urine culture done on 01-15-24 with results of enterobacter cloacae.   Past Medical History:  Diagnosis Date   Anxiety    Arthritis    Cardiac arrest (HCC)    COPD (chronic obstructive pulmonary disease) (HCC)    Coronary artery disease    a. STEMI with cardiac arrest (polymorphic VT) s/p DES to RCA.   GERD (gastroesophageal reflux disease)    Headache(784.0)    Hearing loss, central    Left ear   Hematoma    Hypertension    Memory changes    Mild aortic stenosis    Mild carotid artery disease    a. 1-39% by duplex 02/2018.   Pleural effusion, right    Seizures (HCC) 10/2012   No seizures, speech issues; after a fall, hitting head on left side   Subdural hematoma (HCC) 10/18/2012   UTI (urinary tract infection)    June 2022    Past Surgical History:  Procedure Laterality Date   ABDOMINAL HYSTERECTOMY     APPENDECTOMY     CARDIAC CATHETERIZATION     15 yrs ago   CORONARY/GRAFT ACUTE MI REVASCULARIZATION N/A 01/10/2018   Procedure: Coronary/Graft Acute MI Revascularization;  Surgeon: Court Dorn PARAS, MD;  Location: [facility] INVASIVE CV LAB;  Service: Cardiovascular;  Laterality: N/A;   CRANIOTOMY Left 10/30/2012   Procedure: CRANIOTOMY HEMATOMA EVACUATION SUBDURAL;  Surgeon: Fairy Levels, MD;  Location: [facility] NEURO ORS;  Service: Neurosurgery;  Laterality: Left;  Left Craniotomy for evacuation of subdural hematoma   JOINT REPLACEMENT Bilateral    knees   LEFT HEART CATH AND CORONARY ANGIOGRAPHY N/A 01/10/2018   Procedure: LEFT HEART CATH AND CORONARY ANGIOGRAPHY;  Surgeon: Court Dorn PARAS, MD;  Location: [facility] INVASIVE CV LAB;  Service:  Cardiovascular;  Laterality: N/A;    Social History   Socioeconomic History   Marital status: Married    Spouse name: Not on file   Number of children: Not on file   Years of education: Not on file   Highest education level: Not on file  Occupational History   Not on file  Tobacco Use   Smoking status: Never   Smokeless tobacco: Never  Vaping Use   Vaping status: Never Used  Substance and Sexual Activity   Alcohol  use: No   Drug use: No   Sexual activity: Not on file  Other Topics Concern   Not on file  Social History Narrative   Not on file   Social Drivers of Health   Tobacco Use: Low Risk (01/18/2024)   Patient History    Smoking Tobacco Use: Never    Smokeless Tobacco Use: Never    Passive Exposure: Not on file  Financial Resource Strain: Low Risk (04/24/2023)   Overall Financial Resource Strain (CARDIA)    Difficulty of Paying Living Expenses: Not hard at all  Food Insecurity: No Food Insecurity (04/24/2023)   Hunger Vital Sign    Worried About Running Out of Food in the Last Year: Never true    Ran Out of Food in the Last Year: Never true  Transportation Needs: No Transportation Needs (  04/24/2023)   PRAPARE - Administrator, Civil Service (Medical): No    Lack of Transportation (Non-Medical): No  Physical Activity: Not on file  Stress: No Stress Concern Present (04/24/2023)   [employer] of Occupational Health - Occupational Stress Questionnaire    Feeling of Stress : Only a little  Social Connections: Socially Integrated (04/24/2023)   Social Connection and Isolation Panel    Frequency of Communication with Friends and Family: Three times a week    Frequency of Social Gatherings with Friends and Family: Three times a week    Attends Religious Services: More than 4 times per year    Active Member of Clubs or Organizations: Yes    Attends Banker Meetings: More than 4 times per year    Marital Status: Married  Catering Manager  Violence: Not At Risk (04/24/2023)   Humiliation, Afraid, Rape, and Kick questionnaire    Fear of Current or Ex-Partner: No    Emotionally Abused: No    Physically Abused: No    Sexually Abused: No  Depression (PHQ2-9): Low Risk (11/02/2023)   Depression (PHQ2-9)    PHQ-2 Score: 0  Alcohol  Screen: Not on file  Housing: Low Risk (04/24/2023)   Housing Stability Vital Sign    Unable to Pay for Housing in the Last Year: No    Number of Times Moved in the Last Year: 0    Homeless in the Last Year: No  Utilities: Not At Risk (04/24/2023)   [facility] Utilities    Threatened with loss of utilities: No  Health Literacy: Adequate Health Literacy (04/24/2023)   B1300 Health Literacy    Frequency of need for help with medical instructions: Never   Family History  Problem Relation Age of Onset   Dementia Mother    Cancer Father    Dementia Maternal Aunt    Ataxia Neg Hx    Chorea Neg Hx    Mental retardation Neg Hx    Migraines Neg Hx    Multiple sclerosis Neg Hx    Neurofibromatosis Neg Hx    Neuropathy Neg Hx    Parkinsonism Neg Hx    Seizures Neg Hx    Stroke Neg Hx       VITAL SIGNS BP 132/77   Pulse 73   Temp (!) 97.5 F (36.4 C)   Resp 20   Ht 5' (1.524 m)   Wt 143 lb (64.9 kg)   SpO2 94%   BMI 27.93 kg/m   Outpatient Encounter Medications as of 01/18/2024  Medication Sig   acetaminophen  (TYLENOL ) 325 MG tablet Take 650 mg by mouth every 6 (six) hours.   ALPRAZolam  (XANAX ) 0.5 MG tablet Take 1 tablet (0.5 mg total) by mouth daily at 12 noon. May also have one other time daily as needed   amLODipine  (NORVASC ) 5 MG tablet Take 5 mg by mouth daily.   aspirin  81 MG chewable tablet Chew 81 mg by mouth daily.   bisacodyl  (DULCOLAX) 10 MG suppository Place 10 mg rectally as needed for moderate constipation.   carvedilol  (COREG ) 12.5 MG tablet Take 1 tablet (12.5 mg total) by mouth 2 (two) times daily.   celecoxib  (CELEBREX ) 100 MG capsule Take 100 mg by mouth 2 (two) times daily.    cephALEXin  (KEFLEX ) 250 MG capsule Take 250 mg by mouth once. oral, Once A Day, For UTI prophylaxis.   Cholecalciferol  25 MCG (1000 UT) TBDP Take 1,000 Units by mouth daily.   cyanocobalamin 1000  MCG tablet Take 1,000 mcg by mouth daily.   D-Mannose (AZO D-MANNOSE) 500 MG CAPS Take 500 mg by mouth in the morning and at bedtime.   fluticasone  (FLONASE ) 50 MCG/ACT nasal spray Place 1 spray into both nostrils daily.   gabapentin  (NEURONTIN ) 100 MG capsule Take 100 mg by mouth 3 (three) times daily as needed.   HYDROcodone -acetaminophen  (NORCO) 10-325 MG tablet Take 1 tablet by mouth 2 (two) times daily.   latanoprost  (XALATAN ) 0.005 % ophthalmic solution INSTILL ONE DROP IN BOTH EYES NIGHTLY   levalbuterol (XOPENEX) 1.25 MG/3ML nebulizer solution SMARTSIG:1 Vial(s) Via Nebulizer Every 8 Hours PRN   levETIRAcetam  (KEPPRA ) 250 MG tablet Take 250 mg by mouth 2 (two) times daily.   memantine  (NAMENDA ) 5 MG tablet Take 1 tablet (5 mg total) by mouth 2 (two) times daily.   methocarbamol  (ROBAXIN ) 500 MG tablet Take 500 mg by mouth every 6 (six) hours as needed.   nitroGLYCERIN  (NITROSTAT ) 0.4 MG SL tablet Place 1 tablet (0.4 mg total) under the tongue every 5 (five) minutes x 3 doses as needed for chest pain.   omeprazole  (PRILOSEC) 40 MG capsule Take 1 capsule (40 mg total) by mouth daily.   Probiotic Product (RISA-BID PROBIOTIC) TABS Take 250 tablets by mouth daily.   risperiDONE  (RISPERDAL ) 0.5 MG tablet Take 0.5 mg by mouth at bedtime.   sennosides-docusate sodium  (SENOKOT-S) 8.6-50 MG tablet Take 1 tablet by mouth once. tablet; 8.6-50 mg; amt: 1 tab; oral Special Instructions: for constipation Once A Day 08:00 AM   sertraline  (ZOLOFT ) 50 MG tablet Take 1 tablet (50 mg total) by mouth daily.   [DISCONTINUED] lisinopril  (ZESTRIL ) 20 MG tablet Take 1 tablet (20 mg total) by mouth 2 (two) times daily.   No facility-administered encounter medications on file as of 01/18/2024.     SIGNIFICANT  DIAGNOSTIC EXAMS  TODAY  09-05-23: wbc 7.1; hgb 10.6; hct 32.5; mcv 95.0 plt 177; glucose 100; bun 15; creat 0.57; k+ 3.8; na++ 135; ca 9.1; gfr >60 protein 6.1 albumin 3.3  01-15-24: urine culture: enterobacter cloacae: macrobid  Review of Systems  Constitutional:  Negative for malaise/fatigue.  Respiratory:  Negative for cough and shortness of breath.   Cardiovascular:  Negative for chest pain, palpitations and leg swelling.  Gastrointestinal:  Negative for abdominal pain, constipation and heartburn.  Genitourinary:  Positive for dysuria and urgency.  Musculoskeletal:  Negative for back pain, joint pain and myalgias.  Skin: Negative.   Neurological:  Negative for dizziness.  Psychiatric/Behavioral:  The patient is not nervous/anxious.     Physical Exam Constitutional:      General: She is not in acute distress.    Appearance: She is well-developed. She is not diaphoretic.  Neck:     Thyroid : No thyromegaly.  Cardiovascular:     Rate and Rhythm: Normal rate and regular rhythm.     Pulses: Normal pulses.     Heart sounds: Murmur heard.  Pulmonary:     Effort: Pulmonary effort is normal. No respiratory distress.     Breath sounds: Normal breath sounds.  Abdominal:     General: Bowel sounds are normal. There is no distension.     Palpations: Abdomen is soft.     Tenderness: There is no abdominal tenderness.  Musculoskeletal:        General: Normal range of motion.     Cervical back: Neck supple.     Right lower leg: No edema.     Left lower leg: No edema.  Comments: Significant kyphosis and neck contracture   Lymphadenopathy:     Cervical: No cervical adenopathy.  Skin:    General: Skin is warm and dry.  Neurological:     Mental Status: She is alert. Mental status is at baseline.  Psychiatric:        Mood and Affect: Mood normal.       ASSESSMENT/ PLAN:  TODAY  Acute uti: enterobacter cloacae: will begin macrobid 100 mg twice daily for 5 days and will monitor  her status.    Barnie Seip NP [facility]  call [phone]     [1]  Allergies Allergen Reactions   Lisinopril  Swelling   Egg Protein-Containing Drug Products Itching    Patient reports that she can eat eggs but does not tolerate egg derived products   Morphine  And Codeine     Headache   Penicillins Itching        "
